# Patient Record
Sex: Male | Born: 1945 | Race: Black or African American | Hispanic: No | Smoking: Never smoker
Health system: Southern US, Community
[De-identification: ages and names within clinical notes are randomized; demographics above are authoritative.]

## PROBLEM LIST (undated history)

## (undated) DIAGNOSIS — C61 Malignant neoplasm of prostate: Secondary | ICD-10-CM

## (undated) DIAGNOSIS — R062 Wheezing: Secondary | ICD-10-CM

## (undated) DIAGNOSIS — C3490 Malignant neoplasm of unspecified part of unspecified bronchus or lung: Secondary | ICD-10-CM

## (undated) DIAGNOSIS — R569 Unspecified convulsions: Secondary | ICD-10-CM

## (undated) DIAGNOSIS — E785 Hyperlipidemia, unspecified: Secondary | ICD-10-CM

## (undated) DIAGNOSIS — I96 Gangrene, not elsewhere classified: Secondary | ICD-10-CM

## (undated) DIAGNOSIS — E119 Type 2 diabetes mellitus without complications: Secondary | ICD-10-CM

## (undated) DIAGNOSIS — I739 Peripheral vascular disease, unspecified: Secondary | ICD-10-CM

## (undated) DIAGNOSIS — I2510 Atherosclerotic heart disease of native coronary artery without angina pectoris: Secondary | ICD-10-CM

## (undated) DIAGNOSIS — I10 Essential (primary) hypertension: Secondary | ICD-10-CM

## (undated) DIAGNOSIS — I639 Cerebral infarction, unspecified: Secondary | ICD-10-CM

## (undated) HISTORY — DX: Unspecified convulsions: R56.9

## (undated) HISTORY — PX: CORONARY ARTERY BYPASS GRAFT: SHX141

## (undated) HISTORY — DX: Gangrene, not elsewhere classified: I96

## (undated) HISTORY — DX: Essential (primary) hypertension: I10

## (undated) HISTORY — DX: Wheezing: R06.2

## (undated) HISTORY — PX: OTHER SURGICAL HISTORY: SHX169

## (undated) HISTORY — DX: Hyperlipidemia, unspecified: E78.5

## (undated) HISTORY — DX: Atherosclerotic heart disease of native coronary artery without angina pectoris: I25.10

---

## 2006-01-26 ENCOUNTER — Other Ambulatory Visit: Payer: Self-pay

## 2006-01-26 ENCOUNTER — Inpatient Hospital Stay: Payer: Self-pay | Admitting: Internal Medicine

## 2010-10-09 ENCOUNTER — Ambulatory Visit: Payer: Self-pay | Admitting: Vascular Surgery

## 2010-10-13 ENCOUNTER — Inpatient Hospital Stay: Payer: Self-pay | Admitting: Internal Medicine

## 2010-10-20 LAB — PATHOLOGY REPORT

## 2011-02-06 ENCOUNTER — Inpatient Hospital Stay: Payer: Self-pay | Admitting: Internal Medicine

## 2011-11-16 ENCOUNTER — Ambulatory Visit: Payer: Self-pay | Admitting: Vascular Surgery

## 2011-11-16 LAB — BASIC METABOLIC PANEL
Anion Gap: 8 (ref 7–16)
Calcium, Total: 8.9 mg/dL (ref 8.5–10.1)
Chloride: 105 mmol/L (ref 98–107)
Co2: 28 mmol/L (ref 21–32)
Osmolality: 285 (ref 275–301)

## 2011-12-15 ENCOUNTER — Ambulatory Visit: Payer: Self-pay | Admitting: Vascular Surgery

## 2011-12-15 LAB — CBC
HCT: 36.5 % — ABNORMAL LOW (ref 40.0–52.0)
HGB: 11.6 g/dL — ABNORMAL LOW (ref 13.0–18.0)
RDW: 14.6 % — ABNORMAL HIGH (ref 11.5–14.5)
WBC: 4.4 10*3/uL (ref 3.8–10.6)

## 2011-12-15 LAB — BASIC METABOLIC PANEL
Anion Gap: 10 (ref 7–16)
BUN: 19 mg/dL — ABNORMAL HIGH (ref 7–18)
Calcium, Total: 9.1 mg/dL (ref 8.5–10.1)
Chloride: 105 mmol/L (ref 98–107)
Co2: 25 mmol/L (ref 21–32)
Creatinine: 1.02 mg/dL (ref 0.60–1.30)
EGFR (African American): >60
Potassium: 4 mmol/L (ref 3.5–5.1)

## 2011-12-24 ENCOUNTER — Inpatient Hospital Stay: Payer: Self-pay | Admitting: Vascular Surgery

## 2011-12-25 LAB — CREATININE, SERUM
Creatinine: 1.12 mg/dL (ref 0.60–1.30)
EGFR (Non-African Amer.): >60

## 2011-12-27 LAB — CBC WITH DIFFERENTIAL/PLATELET
Basophil #: 0 10*3/uL (ref 0.0–0.1)
Basophil %: 0.1 %
Eosinophil #: 0.1 10*3/uL (ref 0.0–0.7)
HCT: 28.6 % — ABNORMAL LOW (ref 40.0–52.0)
Lymphocyte %: 6.9 %
MCHC: 32 g/dL (ref 32.0–36.0)
Monocyte #: 1.1 10*3/uL — ABNORMAL HIGH (ref 0.0–0.7)
Neutrophil #: 12.9 10*3/uL — ABNORMAL HIGH (ref 1.4–6.5)
Platelet: 118 10*3/uL — ABNORMAL LOW (ref 150–440)
RDW: 14.6 % — ABNORMAL HIGH (ref 11.5–14.5)
WBC: 15.3 10*3/uL — ABNORMAL HIGH (ref 3.8–10.6)

## 2011-12-28 LAB — CBC WITH DIFFERENTIAL/PLATELET
Basophil #: 0 10*3/uL (ref 0.0–0.1)
Basophil %: 0 %
Eosinophil %: 1.7 %
HCT: 25.2 % — ABNORMAL LOW (ref 40.0–52.0)
HGB: 8.1 g/dL — ABNORMAL LOW (ref 13.0–18.0)
Lymphocyte %: 9.9 %
MCHC: 32.4 g/dL (ref 32.0–36.0)
MCV: 87 fL (ref 80–100)
Monocyte #: 1.1 10*3/uL — ABNORMAL HIGH (ref 0.0–0.7)
Monocyte %: 7.6 %
Neutrophil #: 11.7 10*3/uL — ABNORMAL HIGH (ref 1.4–6.5)
Neutrophil %: 80.8 %
RBC: 2.89 10*6/uL — ABNORMAL LOW (ref 4.40–5.90)
WBC: 14.4 10*3/uL — ABNORMAL HIGH (ref 3.8–10.6)

## 2011-12-29 LAB — CBC WITH DIFFERENTIAL/PLATELET
Basophil #: 0 10*3/uL (ref 0.0–0.1)
Eosinophil #: 0.4 10*3/uL (ref 0.0–0.7)
Eosinophil %: 3.1 %
Lymphocyte %: 14.3 %
MCV: 89 fL (ref 80–100)
Monocyte %: 8.7 %
Neutrophil #: 9.4 10*3/uL — ABNORMAL HIGH (ref 1.4–6.5)
Neutrophil %: 73.8 %
Platelet: 168 10*3/uL (ref 150–440)
RBC: 3.4 10*6/uL — ABNORMAL LOW (ref 4.40–5.90)
RDW: 14.2 % (ref 11.5–14.5)
WBC: 12.8 10*3/uL — ABNORMAL HIGH (ref 3.8–10.6)

## 2011-12-30 LAB — CBC WITH DIFFERENTIAL/PLATELET
Basophil #: 0 10*3/uL (ref 0.0–0.1)
Eosinophil #: 0.2 10*3/uL (ref 0.0–0.7)
HCT: 30 % — ABNORMAL LOW (ref 40.0–52.0)
Lymphocyte #: 2.1 10*3/uL (ref 1.0–3.6)
Lymphocyte %: 15.3 %
MCHC: 32.9 g/dL (ref 32.0–36.0)
Monocyte #: 1.1 10*3/uL — ABNORMAL HIGH (ref 0.0–0.7)
Monocyte %: 7.9 %
Neutrophil #: 10.2 10*3/uL — ABNORMAL HIGH (ref 1.4–6.5)
Neutrophil %: 74.8 %
Platelet: 200 10*3/uL (ref 150–440)
RBC: 3.43 10*6/uL — ABNORMAL LOW (ref 4.40–5.90)
RDW: 14.3 % (ref 11.5–14.5)

## 2012-01-10 ENCOUNTER — Other Ambulatory Visit: Payer: Self-pay | Admitting: Vascular Surgery

## 2012-01-10 LAB — PROTIME-INR
INR: 2.1
Prothrombin Time: 23.7 secs — ABNORMAL HIGH (ref 11.5–14.7)

## 2012-01-20 ENCOUNTER — Ambulatory Visit: Payer: Self-pay | Admitting: Vascular Surgery

## 2012-01-20 LAB — BASIC METABOLIC PANEL
Anion Gap: 11 (ref 7–16)
BUN: 21 mg/dL — ABNORMAL HIGH (ref 7–18)
Calcium, Total: 9.1 mg/dL (ref 8.5–10.1)
Chloride: 104 mmol/L (ref 98–107)
Co2: 25 mmol/L (ref 21–32)
Creatinine: 1.16 mg/dL (ref 0.60–1.30)
EGFR (African American): >60
EGFR (Non-African Amer.): >60
Glucose: 168 mg/dL — ABNORMAL HIGH (ref 65–99)
Potassium: 4.3 mmol/L (ref 3.5–5.1)
Sodium: 140 mmol/L (ref 136–145)

## 2012-01-20 LAB — PROTIME-INR: Prothrombin Time: 28.4 secs — ABNORMAL HIGH (ref 11.5–14.7)

## 2012-01-24 ENCOUNTER — Other Ambulatory Visit: Payer: Self-pay | Admitting: Vascular Surgery

## 2012-01-24 LAB — PROTIME-INR
INR: 2.4
Prothrombin Time: 26.6 secs — ABNORMAL HIGH (ref 11.5–14.7)

## 2012-01-26 ENCOUNTER — Ambulatory Visit: Payer: Self-pay | Admitting: Vascular Surgery

## 2012-01-26 LAB — CREATININE, SERUM
EGFR (African American): >60
EGFR (Non-African Amer.): >60

## 2012-01-27 ENCOUNTER — Ambulatory Visit: Payer: Self-pay | Admitting: Vascular Surgery

## 2012-01-27 LAB — BASIC METABOLIC PANEL
Anion Gap: 7 (ref 7–16)
BUN: 21 mg/dL — ABNORMAL HIGH (ref 7–18)
Chloride: 102 mmol/L (ref 98–107)
Co2: 26 mmol/L (ref 21–32)
Creatinine: 1.13 mg/dL (ref 0.60–1.30)
EGFR (Non-African Amer.): >60
Glucose: 193 mg/dL — ABNORMAL HIGH (ref 65–99)
Osmolality: 278 (ref 275–301)
Sodium: 135 mmol/L — ABNORMAL LOW (ref 136–145)

## 2012-01-27 LAB — CBC
HCT: 31.8 % — ABNORMAL LOW (ref 40.0–52.0)
MCH: 27.6 pg (ref 26.0–34.0)
MCHC: 31.7 g/dL — ABNORMAL LOW (ref 32.0–36.0)
MCV: 87 fL (ref 80–100)
Platelet: 281 10*3/uL (ref 150–440)
WBC: 9.4 10*3/uL (ref 3.8–10.6)

## 2012-02-01 ENCOUNTER — Inpatient Hospital Stay: Payer: Self-pay | Admitting: Vascular Surgery

## 2012-02-02 LAB — BASIC METABOLIC PANEL
Anion Gap: 9 (ref 7–16)
BUN: 13 mg/dL (ref 7–18)
Calcium, Total: 8.3 mg/dL — ABNORMAL LOW (ref 8.5–10.1)
Co2: 23 mmol/L (ref 21–32)
Creatinine: 0.86 mg/dL (ref 0.60–1.30)
EGFR (African American): >60
EGFR (Non-African Amer.): >60
Osmolality: 276 (ref 275–301)
Sodium: 137 mmol/L (ref 136–145)

## 2012-02-02 LAB — CBC WITH DIFFERENTIAL/PLATELET
Basophil %: 0.3 %
Eosinophil #: 0.3 10*3/uL (ref 0.0–0.7)
Eosinophil %: 2.8 %
HCT: 24.3 % — ABNORMAL LOW (ref 40.0–52.0)
HGB: 7.9 g/dL — ABNORMAL LOW (ref 13.0–18.0)
Lymphocyte #: 1.7 10*3/uL (ref 1.0–3.6)
Lymphocyte %: 14.4 %
MCHC: 32.4 g/dL (ref 32.0–36.0)
Monocyte #: 0.7 x10 3/mm (ref 0.2–1.0)
Neutrophil #: 8.9 10*3/uL — ABNORMAL HIGH (ref 1.4–6.5)
Platelet: 256 10*3/uL (ref 150–440)
RBC: 2.85 10*6/uL — ABNORMAL LOW (ref 4.40–5.90)

## 2012-02-02 LAB — PROTIME-INR: INR: 1.2

## 2012-02-03 LAB — APTT
Activated PTT: 57.5 secs — ABNORMAL HIGH (ref 23.6–35.9)
Activated PTT: 71.4 secs — ABNORMAL HIGH (ref 23.6–35.9)

## 2012-02-04 LAB — BASIC METABOLIC PANEL
Anion Gap: 9 (ref 7–16)
BUN: 11 mg/dL (ref 7–18)
Chloride: 103 mmol/L (ref 98–107)
Osmolality: 274 (ref 275–301)
Potassium: 3.7 mmol/L (ref 3.5–5.1)
Sodium: 136 mmol/L (ref 136–145)

## 2012-02-04 LAB — CBC WITH DIFFERENTIAL/PLATELET
Basophil #: 0 10*3/uL (ref 0.0–0.1)
Basophil %: 0.3 %
HCT: 31.4 % — ABNORMAL LOW (ref 40.0–52.0)
MCH: 27.2 pg (ref 26.0–34.0)
MCHC: 31.8 g/dL — ABNORMAL LOW (ref 32.0–36.0)
Monocyte #: 1 x10 3/mm (ref 0.2–1.0)
Monocyte %: 8.1 %
Neutrophil #: 9.2 10*3/uL — ABNORMAL HIGH (ref 1.4–6.5)
Neutrophil %: 72.9 %
RBC: 3.67 10*6/uL — ABNORMAL LOW (ref 4.40–5.90)
RDW: 15.1 % — ABNORMAL HIGH (ref 11.5–14.5)
WBC: 12.7 10*3/uL — ABNORMAL HIGH (ref 3.8–10.6)

## 2012-02-04 LAB — PROTIME-INR
INR: 1.1
Prothrombin Time: 14.4 secs (ref 11.5–14.7)

## 2012-02-04 LAB — PLATELET COUNT: Platelet: 233 10*3/uL (ref 150–440)

## 2012-02-04 LAB — APTT: Activated PTT: 88.7 secs — ABNORMAL HIGH (ref 23.6–35.9)

## 2012-02-05 LAB — APTT
Activated PTT: 46.2 secs — ABNORMAL HIGH (ref 23.6–35.9)
Activated PTT: 40.4 secs — ABNORMAL HIGH (ref 23.6–35.9)
Activated PTT: 41.5 secs — ABNORMAL HIGH (ref 23.6–35.9)

## 2012-02-06 LAB — APTT
Activated PTT: 132.3 secs — ABNORMAL HIGH (ref 23.6–35.9)
Activated PTT: >160 secs (ref 23.6–35.9)
Activated PTT: 106.4 secs — ABNORMAL HIGH (ref 23.6–35.9)

## 2012-02-06 LAB — PROTIME-INR: INR: 1.2

## 2012-02-07 LAB — PROTIME-INR: INR: 1.2

## 2012-02-07 LAB — HEMOGLOBIN: HGB: 9.7 g/dL — ABNORMAL LOW (ref 13.0–18.0)

## 2012-02-07 LAB — PLATELET COUNT: Platelet: 241 10*3/uL (ref 150–440)

## 2012-02-08 LAB — PROTIME-INR
INR: 1.3
Prothrombin Time: 17 secs — ABNORMAL HIGH (ref 11.5–14.7)

## 2012-02-08 LAB — APTT
Activated PTT: 123.8 secs — ABNORMAL HIGH (ref 23.6–35.9)
Activated PTT: 41.6 secs — ABNORMAL HIGH (ref 23.6–35.9)

## 2012-02-09 LAB — CREATININE, SERUM
Creatinine: 1.16 mg/dL (ref 0.60–1.30)
EGFR (African American): >60
EGFR (Non-African Amer.): >60

## 2012-02-22 LAB — PROTIME-INR
INR: 1.7
Prothrombin Time: 20.4 secs — ABNORMAL HIGH (ref 11.5–14.7)

## 2012-02-23 ENCOUNTER — Other Ambulatory Visit: Payer: Self-pay | Admitting: Vascular Surgery

## 2012-03-09 LAB — PROTIME-INR: Prothrombin Time: 21.5 secs — ABNORMAL HIGH (ref 11.5–14.7)

## 2012-03-25 ENCOUNTER — Other Ambulatory Visit: Payer: Self-pay | Admitting: Vascular Surgery

## 2012-03-28 LAB — PROTIME-INR
INR: 1.8
Prothrombin Time: 21.6 secs — ABNORMAL HIGH (ref 11.5–14.7)

## 2012-04-04 LAB — PROTIME-INR
INR: 2.1
Prothrombin Time: 23.5 secs — ABNORMAL HIGH (ref 11.5–14.7)

## 2012-04-11 LAB — PROTIME-INR: INR: 2.3

## 2012-04-18 LAB — PROTIME-INR: Prothrombin Time: 30.8 secs — ABNORMAL HIGH (ref 11.5–14.7)

## 2012-04-24 ENCOUNTER — Other Ambulatory Visit: Payer: Self-pay | Admitting: Vascular Surgery

## 2012-04-25 LAB — PROTIME-INR: INR: 3

## 2012-05-09 LAB — PROTIME-INR: Prothrombin Time: 29.5 secs — ABNORMAL HIGH (ref 11.5–14.7)

## 2012-05-16 LAB — PROTIME-INR
INR: 2.9
Prothrombin Time: 30.3 secs — ABNORMAL HIGH (ref 11.5–14.7)

## 2012-05-23 LAB — PROTIME-INR
INR: 2.9
Prothrombin Time: 30.5 secs — ABNORMAL HIGH (ref 11.5–14.7)

## 2012-05-25 ENCOUNTER — Other Ambulatory Visit: Payer: Self-pay | Admitting: Vascular Surgery

## 2012-05-30 LAB — PROTIME-INR: INR: 1

## 2012-06-06 LAB — PROTIME-INR
INR: 1.3
Prothrombin Time: 16.3 secs — ABNORMAL HIGH (ref 11.5–14.7)

## 2012-06-22 LAB — PROTIME-INR
INR: 5.4
Prothrombin Time: 48.8 secs — ABNORMAL HIGH (ref 11.5–14.7)

## 2012-06-25 ENCOUNTER — Other Ambulatory Visit: Payer: Self-pay | Admitting: Vascular Surgery

## 2012-06-27 LAB — PROTIME-INR: INR: 2.9

## 2012-07-04 LAB — PROTIME-INR: INR: 1.8

## 2012-07-11 LAB — PROTIME-INR: INR: 2.4

## 2012-07-18 LAB — PROTIME-INR: Prothrombin Time: 22.5 secs — ABNORMAL HIGH (ref 11.5–14.7)

## 2012-07-25 ENCOUNTER — Other Ambulatory Visit: Payer: Self-pay | Admitting: Vascular Surgery

## 2012-07-26 LAB — PROTIME-INR
INR: 2.4
Prothrombin Time: 26.1 secs — ABNORMAL HIGH (ref 11.5–14.7)

## 2012-08-01 LAB — PROTIME-INR: Prothrombin Time: 20.8 secs — ABNORMAL HIGH (ref 11.5–14.7)

## 2012-08-09 LAB — CREATININE, SERUM
EGFR (African American): >60
EGFR (Non-African Amer.): >60

## 2012-08-15 LAB — CREATININE, SERUM
Creatinine: 1.11 mg/dL
EGFR (African American): >60
EGFR (Non-African Amer.): >60

## 2012-08-23 LAB — CREATININE, SERUM: EGFR (African American): >60

## 2012-08-25 ENCOUNTER — Other Ambulatory Visit: Payer: Self-pay | Admitting: Vascular Surgery

## 2012-09-05 LAB — CREATININE, SERUM
Creatinine: 1.1 mg/dL (ref 0.60–1.30)
EGFR (African American): >60
EGFR (Non-African Amer.): >60

## 2012-09-13 LAB — CREATININE, SERUM
EGFR (African American): >60
EGFR (Non-African Amer.): >60

## 2012-09-22 LAB — CREATININE, SERUM
EGFR (African American): >60
EGFR (Non-African Amer.): >60

## 2012-09-24 ENCOUNTER — Other Ambulatory Visit: Payer: Self-pay | Admitting: Vascular Surgery

## 2012-09-26 LAB — CREATININE, SERUM
Creatinine: 1.3 mg/dL (ref 0.60–1.30)
EGFR (African American): >60
EGFR (Non-African Amer.): 56 — ABNORMAL LOW

## 2012-10-03 LAB — CREATININE, SERUM
Creatinine: 1.13 mg/dL (ref 0.60–1.30)
EGFR (African American): >60

## 2012-10-10 LAB — CREATININE, SERUM
Creatinine: 1.13 mg/dL (ref 0.60–1.30)
EGFR (African American): >60
EGFR (Non-African Amer.): >60

## 2012-10-20 LAB — CREATININE, SERUM
Creatinine: 1.17 mg/dL (ref 0.60–1.30)
EGFR (African American): >60
EGFR (Non-African Amer.): >60

## 2012-10-24 LAB — CREATININE, SERUM
EGFR (African American): 59 — ABNORMAL LOW
EGFR (Non-African Amer.): 51 — ABNORMAL LOW

## 2012-10-25 ENCOUNTER — Other Ambulatory Visit: Payer: Self-pay | Admitting: Vascular Surgery

## 2012-11-01 LAB — CREATININE, SERUM
Creatinine: 1.19 mg/dL (ref 0.60–1.30)
EGFR (Non-African Amer.): >60

## 2012-11-07 LAB — CREATININE, SERUM
Creatinine: 1.14 mg/dL (ref 0.60–1.30)
EGFR (African American): >60
EGFR (Non-African Amer.): >60

## 2012-11-14 LAB — CREATININE, SERUM
Creatinine: 0.94 mg/dL (ref 0.60–1.30)
EGFR (African American): >60

## 2012-11-23 LAB — CREATININE, SERUM
Creatinine: 1.02 mg/dL (ref 0.60–1.30)
EGFR (African American): >60

## 2012-11-25 ENCOUNTER — Other Ambulatory Visit: Payer: Self-pay | Admitting: Vascular Surgery

## 2012-11-28 LAB — CREATININE, SERUM
Creatinine: 1.11 mg/dL (ref 0.60–1.30)
EGFR (Non-African Amer.): >60

## 2012-12-05 LAB — CREATININE, SERUM
Creatinine: 1.66 mg/dL — ABNORMAL HIGH (ref 0.60–1.30)
EGFR (African American): 49 — ABNORMAL LOW
EGFR (Non-African Amer.): 42 — ABNORMAL LOW

## 2012-12-13 LAB — CREATININE, SERUM: EGFR (Non-African Amer.): >60

## 2012-12-20 LAB — CREATININE, SERUM: EGFR (African American): >60

## 2012-12-23 ENCOUNTER — Other Ambulatory Visit: Payer: Self-pay | Admitting: Vascular Surgery

## 2012-12-28 LAB — CREATININE, SERUM
Creatinine: 1.32 mg/dL — ABNORMAL HIGH (ref 0.60–1.30)
EGFR (African American): >60
EGFR (Non-African Amer.): 55 — ABNORMAL LOW

## 2013-01-02 LAB — CREATININE, SERUM
Creatinine: 1.09 mg/dL (ref 0.60–1.30)
EGFR (Non-African Amer.): >60

## 2013-01-10 LAB — CREATININE, SERUM: Creatinine: 1.09 mg/dL (ref 0.60–1.30)

## 2013-01-16 LAB — CREATININE, SERUM: Creatinine: 1.49 mg/dL — ABNORMAL HIGH (ref 0.60–1.30)

## 2013-01-23 ENCOUNTER — Other Ambulatory Visit: Payer: Self-pay | Admitting: Vascular Surgery

## 2013-02-12 LAB — PROTIME-INR: INR: 1

## 2013-02-20 LAB — PROTIME-INR
INR: 1.1
Prothrombin Time: 14 secs (ref 11.5–14.7)

## 2013-02-22 ENCOUNTER — Other Ambulatory Visit: Payer: Self-pay | Admitting: Vascular Surgery

## 2013-03-09 LAB — PROTIME-INR: INR: 1

## 2013-03-25 ENCOUNTER — Other Ambulatory Visit: Payer: Self-pay | Admitting: Vascular Surgery

## 2014-01-23 ENCOUNTER — Observation Stay: Payer: Self-pay | Admitting: Vascular Surgery

## 2014-01-23 LAB — FIBRINOGEN: FIBRINOGEN: 242 mg/dL (ref 210–470)

## 2014-01-23 LAB — BASIC METABOLIC PANEL
Anion Gap: 4 — ABNORMAL LOW (ref 7–16)
BUN: 15 mg/dL (ref 7–18)
Calcium, Total: 9 mg/dL (ref 8.5–10.1)
Chloride: 104 mmol/L (ref 98–107)
Co2: 27 mmol/L (ref 21–32)
Creatinine: 1.14 mg/dL (ref 0.60–1.30)
EGFR (African American): >60
EGFR (Non-African Amer.): >60
GLUCOSE: 206 mg/dL — AB (ref 65–99)
Osmolality: 277 (ref 275–301)
Potassium: 4.6 mmol/L (ref 3.5–5.1)
Sodium: 135 mmol/L — ABNORMAL LOW (ref 136–145)

## 2014-01-23 LAB — CBC WITH DIFFERENTIAL/PLATELET
BASOS PCT: 0.6 %
Basophil #: 0 10*3/uL (ref 0.0–0.1)
Basophil #: 0 10*3/uL (ref 0.0–0.1)
Basophil %: 0.2 %
EOS ABS: 0 10*3/uL (ref 0.0–0.7)
Eosinophil #: 0 10*3/uL (ref 0.0–0.7)
Eosinophil %: 0.1 %
Eosinophil %: 0.6 %
HCT: 42.1 % (ref 40.0–52.0)
HCT: 38.2 % — ABNORMAL LOW (ref 40.0–52.0)
HGB: 13.2 g/dL (ref 13.0–18.0)
HGB: 12.2 g/dL — ABNORMAL LOW (ref 13.0–18.0)
LYMPHS ABS: 1.4 10*3/uL (ref 1.0–3.6)
Lymphocyte #: 0.9 10*3/uL — ABNORMAL LOW (ref 1.0–3.6)
Lymphocyte %: 11.5 %
Lymphocyte %: 20.4 %
MCH: 27.7 pg (ref 26.0–34.0)
MCH: 28.2 pg (ref 26.0–34.0)
MCHC: 31.4 g/dL — ABNORMAL LOW (ref 32.0–36.0)
MCHC: 32.1 g/dL (ref 32.0–36.0)
MCV: 88 fL (ref 80–100)
MCV: 88 fL (ref 80–100)
MONOS PCT: 3.5 %
Monocyte #: 0.3 x10 3/mm (ref 0.2–1.0)
Monocyte #: 0.7 x10 3/mm (ref 0.2–1.0)
Monocyte %: 9.7 %
NEUTROS PCT: 84.3 %
Neutrophil #: 6.7 10*3/uL — ABNORMAL HIGH (ref 1.4–6.5)
Neutrophil #: 4.8 10*3/uL (ref 1.4–6.5)
Neutrophil %: 69.1 %
Platelet: 149 10*3/uL — ABNORMAL LOW (ref 150–440)
Platelet: 144 10*3/uL — ABNORMAL LOW (ref 150–440)
RBC: 4.76 10*6/uL (ref 4.40–5.90)
RBC: 4.34 10*6/uL — ABNORMAL LOW (ref 4.40–5.90)
RDW: 14.3 % (ref 11.5–14.5)
RDW: 14.3 % (ref 11.5–14.5)
WBC: 8 10*3/uL (ref 3.8–10.6)
WBC: 7 10*3/uL (ref 3.8–10.6)

## 2014-01-23 LAB — APTT
Activated PTT: 30.4 secs (ref 23.6–35.9)
Activated PTT: 127.2 secs — ABNORMAL HIGH (ref 23.6–35.9)

## 2014-01-24 LAB — BASIC METABOLIC PANEL
ANION GAP: 6 — AB (ref 7–16)
BUN: 14 mg/dL (ref 7–18)
Calcium, Total: 8.7 mg/dL (ref 8.5–10.1)
Chloride: 104 mmol/L (ref 98–107)
Co2: 27 mmol/L (ref 21–32)
Creatinine: 1.2 mg/dL (ref 0.60–1.30)
EGFR (African American): >60
GLUCOSE: 173 mg/dL — AB (ref 65–99)
Osmolality: 278 (ref 275–301)
POTASSIUM: 4.2 mmol/L (ref 3.5–5.1)
Sodium: 137 mmol/L (ref 136–145)

## 2014-01-24 LAB — PLATELET COUNT: Platelet: 122 10*3/uL — ABNORMAL LOW (ref 150–440)

## 2014-01-24 LAB — APTT
ACTIVATED PTT: 45.3 s — AB (ref 23.6–35.9)
Activated PTT: 56.8 secs — ABNORMAL HIGH (ref 23.6–35.9)

## 2014-01-24 LAB — CBC WITH DIFFERENTIAL/PLATELET
Basophil #: 0 10*3/uL (ref 0.0–0.1)
Basophil %: 0.3 %
Eosinophil #: 0.1 10*3/uL (ref 0.0–0.7)
Eosinophil %: 1 %
HCT: 39.8 % — ABNORMAL LOW (ref 40.0–52.0)
HGB: 12.9 g/dL — AB (ref 13.0–18.0)
LYMPHS ABS: 1.5 10*3/uL (ref 1.0–3.6)
LYMPHS PCT: 19.9 %
MCH: 28.4 pg (ref 26.0–34.0)
MCHC: 32.3 g/dL (ref 32.0–36.0)
MCV: 88 fL (ref 80–100)
MONO ABS: 0.9 x10 3/mm (ref 0.2–1.0)
Monocyte %: 11.9 %
Neutrophil #: 5.2 10*3/uL (ref 1.4–6.5)
Neutrophil %: 66.9 %
PLATELETS: 114 10*3/uL — AB (ref 150–440)
RBC: 4.54 10*6/uL (ref 4.40–5.90)
RDW: 14.3 % (ref 11.5–14.5)
WBC: 7.8 10*3/uL (ref 3.8–10.6)

## 2014-01-24 LAB — FIBRINOGEN: Fibrinogen: 186 mg/dL — ABNORMAL LOW (ref 210–470)

## 2014-01-24 LAB — PROTIME-INR
INR: 1.2
Prothrombin Time: 15.4 secs — ABNORMAL HIGH (ref 11.5–14.7)

## 2014-01-25 LAB — BASIC METABOLIC PANEL
Anion Gap: 7 (ref 7–16)
BUN: 10 mg/dL (ref 7–18)
CREATININE: 1.09 mg/dL (ref 0.60–1.30)
Calcium, Total: 9 mg/dL (ref 8.5–10.1)
Chloride: 104 mmol/L (ref 98–107)
Co2: 25 mmol/L (ref 21–32)
EGFR (Non-African Amer.): >60
Glucose: 171 mg/dL — ABNORMAL HIGH (ref 65–99)
Osmolality: 275 (ref 275–301)
Potassium: 4.4 mmol/L (ref 3.5–5.1)
Sodium: 136 mmol/L (ref 136–145)

## 2014-06-11 ENCOUNTER — Inpatient Hospital Stay: Payer: Self-pay | Admitting: Vascular Surgery

## 2014-06-11 LAB — CBC WITH DIFFERENTIAL/PLATELET
Basophil #: 0 10*3/uL (ref 0.0–0.1)
Basophil %: 0.3 %
EOS ABS: 0.1 10*3/uL (ref 0.0–0.7)
Eosinophil %: 1.1 %
HCT: 36.8 % — AB (ref 40.0–52.0)
HGB: 11.8 g/dL — ABNORMAL LOW (ref 13.0–18.0)
LYMPHS ABS: 1.1 10*3/uL (ref 1.0–3.6)
Lymphocyte %: 12.2 %
MCH: 27.4 pg (ref 26.0–34.0)
MCHC: 32 g/dL (ref 32.0–36.0)
MCV: 86 fL (ref 80–100)
MONO ABS: 0.7 x10 3/mm (ref 0.2–1.0)
Monocyte %: 7.6 %
NEUTROS PCT: 78.8 %
Neutrophil #: 7.4 10*3/uL — ABNORMAL HIGH (ref 1.4–6.5)
Platelet: 190 10*3/uL (ref 150–440)
RBC: 4.29 10*6/uL — AB (ref 4.40–5.90)
RDW: 14.7 % — AB (ref 11.5–14.5)
WBC: 9.4 10*3/uL (ref 3.8–10.6)

## 2014-06-11 LAB — APTT: ACTIVATED PTT: 41.7 s — AB (ref 23.6–35.9)

## 2014-06-11 LAB — PROTIME-INR
INR: 1.3
Prothrombin Time: 15.9 secs — ABNORMAL HIGH (ref 11.5–14.7)

## 2014-06-12 LAB — CBC WITH DIFFERENTIAL/PLATELET
BASOS PCT: 0.4 %
Basophil #: 0 10*3/uL (ref 0.0–0.1)
EOS PCT: 2.7 %
Eosinophil #: 0.2 10*3/uL (ref 0.0–0.7)
HCT: 34.7 % — ABNORMAL LOW (ref 40.0–52.0)
HGB: 11.4 g/dL — ABNORMAL LOW (ref 13.0–18.0)
Lymphocyte #: 2.1 10*3/uL (ref 1.0–3.6)
Lymphocyte %: 26.7 %
MCH: 27.7 pg (ref 26.0–34.0)
MCHC: 32.7 g/dL (ref 32.0–36.0)
MCV: 85 fL (ref 80–100)
MONO ABS: 0.7 x10 3/mm (ref 0.2–1.0)
Monocyte %: 9.1 %
NEUTROS PCT: 61.1 %
Neutrophil #: 4.8 10*3/uL (ref 1.4–6.5)
Platelet: 182 10*3/uL (ref 150–440)
RBC: 4.1 10*6/uL — AB (ref 4.40–5.90)
RDW: 14 % (ref 11.5–14.5)
WBC: 7.8 10*3/uL (ref 3.8–10.6)

## 2014-06-12 LAB — HEPARIN LEVEL (UNFRACTIONATED)
Anti-Xa(Unfractionated): >1.1 IU/mL (ref 0.30–0.70)
Anti-Xa(Unfractionated): 0.66 IU/mL (ref 0.30–0.70)

## 2014-06-12 LAB — BASIC METABOLIC PANEL
ANION GAP: 10 (ref 7–16)
BUN: 12 mg/dL (ref 7–18)
CO2: 24 mmol/L (ref 21–32)
Calcium, Total: 9 mg/dL (ref 8.5–10.1)
Chloride: 103 mmol/L (ref 98–107)
Creatinine: 1.12 mg/dL (ref 0.60–1.30)
EGFR (African American): >60
Glucose: 151 mg/dL — ABNORMAL HIGH (ref 65–99)
Osmolality: 276 (ref 275–301)
Potassium: 3.7 mmol/L (ref 3.5–5.1)
Sodium: 137 mmol/L (ref 136–145)

## 2014-06-13 LAB — CBC WITH DIFFERENTIAL/PLATELET
BASOS ABS: 0 10*3/uL (ref 0.0–0.1)
BASOS PCT: 0.3 %
Basophil #: 0 10*3/uL (ref 0.0–0.1)
Basophil #: 0 10*3/uL (ref 0.0–0.1)
Basophil %: 0.7 %
Basophil %: 0.6 %
EOS ABS: 0.1 10*3/uL (ref 0.0–0.7)
EOS ABS: 0 10*3/uL (ref 0.0–0.7)
Eosinophil #: 0.3 10*3/uL (ref 0.0–0.7)
Eosinophil %: 4.8 %
Eosinophil %: 1.8 %
Eosinophil %: 0.1 %
HCT: 36.4 % — ABNORMAL LOW (ref 40.0–52.0)
HCT: 38.8 % — ABNORMAL LOW (ref 40.0–52.0)
HCT: 36.3 % — AB (ref 40.0–52.0)
HGB: 11.5 g/dL — AB (ref 13.0–18.0)
HGB: 12.2 g/dL — ABNORMAL LOW (ref 13.0–18.0)
HGB: 11.9 g/dL — AB (ref 13.0–18.0)
LYMPHS ABS: 1.4 10*3/uL (ref 1.0–3.6)
LYMPHS PCT: 27.1 %
LYMPHS PCT: 21.6 %
Lymphocyte #: 1.7 10*3/uL (ref 1.0–3.6)
Lymphocyte #: 0.6 10*3/uL — ABNORMAL LOW (ref 1.0–3.6)
Lymphocyte %: 8.2 %
MCH: 27.1 pg (ref 26.0–34.0)
MCH: 26.8 pg (ref 26.0–34.0)
MCH: 27.7 pg (ref 26.0–34.0)
MCHC: 31.6 g/dL — ABNORMAL LOW (ref 32.0–36.0)
MCHC: 31.5 g/dL — AB (ref 32.0–36.0)
MCHC: 32.8 g/dL (ref 32.0–36.0)
MCV: 86 fL (ref 80–100)
MCV: 85 fL (ref 80–100)
MCV: 84 fL (ref 80–100)
MONO ABS: 0.5 x10 3/mm (ref 0.2–1.0)
Monocyte #: 0.6 x10 3/mm (ref 0.2–1.0)
Monocyte #: 0.4 x10 3/mm (ref 0.2–1.0)
Monocyte %: 10 %
Monocyte %: 6.8 %
Monocyte %: 6.9 %
NEUTROS ABS: 3.7 10*3/uL (ref 1.4–6.5)
Neutrophil #: 4.5 10*3/uL (ref 1.4–6.5)
Neutrophil #: 6.7 10*3/uL — ABNORMAL HIGH (ref 1.4–6.5)
Neutrophil %: 57.4 %
Neutrophil %: 69.2 %
Neutrophil %: 84.5 %
PLATELETS: 200 10*3/uL (ref 150–440)
PLATELETS: 209 10*3/uL (ref 150–440)
Platelet: 214 10*3/uL (ref 150–440)
RBC: 4.24 10*6/uL — ABNORMAL LOW (ref 4.40–5.90)
RBC: 4.56 10*6/uL (ref 4.40–5.90)
RBC: 4.3 10*6/uL — AB (ref 4.40–5.90)
RDW: 13.8 % (ref 11.5–14.5)
RDW: 14.2 % (ref 11.5–14.5)
RDW: 14.2 % (ref 11.5–14.5)
WBC: 6.4 10*3/uL (ref 3.8–10.6)
WBC: 6.5 10*3/uL (ref 3.8–10.6)
WBC: 7.9 10*3/uL (ref 3.8–10.6)

## 2014-06-13 LAB — HEPARIN LEVEL (UNFRACTIONATED): Anti-Xa(Unfractionated): 0.54 IU/mL (ref 0.30–0.70)

## 2014-06-13 LAB — FIBRINOGEN
FIBRINOGEN: 620 mg/dL — AB (ref 210–470)
Fibrinogen: 570 mg/dL — ABNORMAL HIGH (ref 210–470)

## 2014-06-14 LAB — CBC WITH DIFFERENTIAL/PLATELET
BASOS ABS: 0 10*3/uL (ref 0.0–0.1)
BASOS ABS: 0.1 10*3/uL (ref 0.0–0.1)
BASOS PCT: 2.1 %
Basophil #: 0.2 10*3/uL — ABNORMAL HIGH (ref 0.0–0.1)
Basophil %: 0.4 %
Basophil %: 0.3 %
EOS PCT: 0.1 %
EOS PCT: 0 %
Eosinophil #: 0 10*3/uL (ref 0.0–0.7)
Eosinophil #: 0 10*3/uL (ref 0.0–0.7)
Eosinophil #: 0 10*3/uL (ref 0.0–0.7)
Eosinophil %: 0.3 %
HCT: 34.5 % — ABNORMAL LOW (ref 40.0–52.0)
HCT: 34.1 % — AB (ref 40.0–52.0)
HCT: 34.7 % — ABNORMAL LOW (ref 40.0–52.0)
HGB: 10.9 g/dL — ABNORMAL LOW (ref 13.0–18.0)
HGB: 10.8 g/dL — AB (ref 13.0–18.0)
HGB: 10.8 g/dL — AB (ref 13.0–18.0)
LYMPHS ABS: 1.3 10*3/uL (ref 1.0–3.6)
Lymphocyte #: 0.7 10*3/uL — ABNORMAL LOW (ref 1.0–3.6)
Lymphocyte #: 0.8 10*3/uL — ABNORMAL LOW (ref 1.0–3.6)
Lymphocyte %: 6.7 %
Lymphocyte %: 11.3 %
Lymphocyte %: 5 %
MCH: 27 pg (ref 26.0–34.0)
MCH: 26.9 pg (ref 26.0–34.0)
MCH: 26.8 pg (ref 26.0–34.0)
MCHC: 31.7 g/dL — ABNORMAL LOW (ref 32.0–36.0)
MCHC: 31.5 g/dL — AB (ref 32.0–36.0)
MCHC: 31 g/dL — AB (ref 32.0–36.0)
MCV: 85 fL (ref 80–100)
MCV: 85 fL (ref 80–100)
MCV: 86 fL (ref 80–100)
MONOS PCT: 8.7 %
MONOS PCT: 6.7 %
Monocyte #: 0.7 x10 3/mm (ref 0.2–1.0)
Monocyte #: 1 x10 3/mm (ref 0.2–1.0)
Monocyte #: 1.2 x10 3/mm — ABNORMAL HIGH (ref 0.2–1.0)
Monocyte %: 7.3 %
NEUTROS ABS: 9.4 10*3/uL — AB (ref 1.4–6.5)
NEUTROS ABS: 14.8 10*3/uL — AB (ref 1.4–6.5)
Neutrophil #: 9.3 10*3/uL — ABNORMAL HIGH (ref 1.4–6.5)
Neutrophil %: 84.4 %
Neutrophil %: 79.3 %
Neutrophil %: 87.4 %
PLATELETS: 205 10*3/uL (ref 150–440)
PLATELETS: 201 10*3/uL (ref 150–440)
PLATELETS: 188 10*3/uL (ref 150–440)
RBC: 4.05 10*6/uL — ABNORMAL LOW (ref 4.40–5.90)
RBC: 4 10*6/uL — ABNORMAL LOW (ref 4.40–5.90)
RBC: 4.03 10*6/uL — AB (ref 4.40–5.90)
RDW: 14.2 % (ref 11.5–14.5)
RDW: 14.1 % (ref 11.5–14.5)
RDW: 14.1 % (ref 11.5–14.5)
WBC: 11.2 10*3/uL — ABNORMAL HIGH (ref 3.8–10.6)
WBC: 11.7 10*3/uL — AB (ref 3.8–10.6)
WBC: 16.9 10*3/uL — ABNORMAL HIGH (ref 3.8–10.6)

## 2014-06-14 LAB — FIBRINOGEN
Fibrinogen: 385 mg/dL (ref 210–470)
Fibrinogen: 533 mg/dL — ABNORMAL HIGH (ref 210–470)

## 2014-06-15 LAB — CREATININE, SERUM
CREATININE: 1.1 mg/dL (ref 0.60–1.30)
EGFR (African American): >60

## 2014-06-18 ENCOUNTER — Ambulatory Visit: Payer: Self-pay | Admitting: Vascular Surgery

## 2014-06-18 LAB — CBC WITH DIFFERENTIAL/PLATELET
BASOS ABS: 0 10*3/uL (ref 0.0–0.1)
Basophil %: 0.3 %
Eosinophil #: 0.1 10*3/uL (ref 0.0–0.7)
Eosinophil %: 1.2 %
HCT: 29.4 % — AB (ref 40.0–52.0)
HGB: 9.3 g/dL — AB (ref 13.0–18.0)
LYMPHS PCT: 12.3 %
Lymphocyte #: 1.5 10*3/uL (ref 1.0–3.6)
MCH: 27.1 pg (ref 26.0–34.0)
MCHC: 31.5 g/dL — ABNORMAL LOW (ref 32.0–36.0)
MCV: 86 fL (ref 80–100)
MONO ABS: 1.1 x10 3/mm — AB (ref 0.2–1.0)
MONOS PCT: 9.6 %
NEUTROS ABS: 9.2 10*3/uL — AB (ref 1.4–6.5)
Neutrophil %: 76.6 %
Platelet: 245 10*3/uL (ref 150–440)
RBC: 3.41 10*6/uL — ABNORMAL LOW (ref 4.40–5.90)
RDW: 14.2 % (ref 11.5–14.5)
WBC: 12 10*3/uL — ABNORMAL HIGH (ref 3.8–10.6)

## 2014-06-18 LAB — BASIC METABOLIC PANEL
Anion Gap: 11 (ref 7–16)
BUN: 10 mg/dL (ref 7–18)
CHLORIDE: 101 mmol/L (ref 98–107)
CREATININE: 1.21 mg/dL (ref 0.60–1.30)
Calcium, Total: 9.1 mg/dL (ref 8.5–10.1)
Co2: 24 mmol/L (ref 21–32)
EGFR (African American): >60
EGFR (Non-African Amer.): >60
Glucose: 158 mg/dL — ABNORMAL HIGH (ref 65–99)
OSMOLALITY: 274 (ref 275–301)
POTASSIUM: 3.7 mmol/L (ref 3.5–5.1)
Sodium: 136 mmol/L (ref 136–145)

## 2014-07-08 ENCOUNTER — Ambulatory Visit: Payer: Self-pay | Admitting: Vascular Surgery

## 2014-07-08 DIAGNOSIS — E119 Type 2 diabetes mellitus without complications: Secondary | ICD-10-CM

## 2014-07-08 DIAGNOSIS — Z01810 Encounter for preprocedural cardiovascular examination: Secondary | ICD-10-CM

## 2014-07-08 LAB — URINALYSIS, COMPLETE
BLOOD: NEGATIVE
Bilirubin,UR: NEGATIVE
Glucose,UR: NEGATIVE mg/dL (ref 0–75)
Hyaline Cast: 17
KETONE: NEGATIVE
Leukocyte Esterase: NEGATIVE
NITRITE: NEGATIVE
Ph: 5 (ref 4.5–8.0)
RBC,UR: 1 /HPF (ref 0–5)
Specific Gravity: 1.029 (ref 1.003–1.030)
Squamous Epithelial: 3
WBC UR: NONE SEEN /HPF (ref 0–5)

## 2014-07-08 LAB — BASIC METABOLIC PANEL
ANION GAP: 6 — AB (ref 7–16)
BUN: 20 mg/dL — AB (ref 7–18)
CO2: 26 mmol/L (ref 21–32)
CREATININE: 1.38 mg/dL — AB (ref 0.60–1.30)
Calcium, Total: 9.4 mg/dL (ref 8.5–10.1)
Chloride: 102 mmol/L (ref 98–107)
EGFR (African American): >60
GFR CALC NON AF AMER: 52 — AB
GLUCOSE: 162 mg/dL — AB (ref 65–99)
Osmolality: 274 (ref 275–301)
POTASSIUM: 4.1 mmol/L (ref 3.5–5.1)
Sodium: 134 mmol/L — ABNORMAL LOW (ref 136–145)

## 2014-07-08 LAB — CBC
HCT: 29.4 % — ABNORMAL LOW (ref 40.0–52.0)
HGB: 9.2 g/dL — ABNORMAL LOW (ref 13.0–18.0)
MCH: 26.8 pg (ref 26.0–34.0)
MCHC: 31.2 g/dL — AB (ref 32.0–36.0)
MCV: 86 fL (ref 80–100)
PLATELETS: 338 10*3/uL (ref 150–440)
RBC: 3.43 10*6/uL — ABNORMAL LOW (ref 4.40–5.90)
RDW: 14.5 % (ref 11.5–14.5)
WBC: 8.5 10*3/uL (ref 3.8–10.6)

## 2014-07-08 LAB — PROTIME-INR
INR: 1.2
Prothrombin Time: 14.9 secs — ABNORMAL HIGH (ref 11.5–14.7)

## 2014-07-08 LAB — APTT: Activated PTT: 35.5 secs (ref 23.6–35.9)

## 2014-07-10 ENCOUNTER — Inpatient Hospital Stay: Payer: Self-pay | Admitting: Vascular Surgery

## 2014-07-10 HISTORY — PX: OTHER SURGICAL HISTORY: SHX169

## 2014-07-12 LAB — PATHOLOGY REPORT

## 2014-07-17 ENCOUNTER — Other Ambulatory Visit: Payer: Self-pay | Admitting: *Deleted

## 2014-07-17 MED ORDER — OXYCODONE-ACETAMINOPHEN 5-325 MG PO TABS: ORAL_TABLET | ORAL | Status: DC

## 2014-07-17 NOTE — Telephone Encounter (Signed)
[facility] 

## 2014-07-18 ENCOUNTER — Non-Acute Institutional Stay (SKILLED_NURSING_FACILITY): Payer: Commercial Managed Care - HMO | Admitting: Adult Health

## 2014-07-18 DIAGNOSIS — Z89611 Acquired absence of right leg above knee: Secondary | ICD-10-CM

## 2014-07-18 DIAGNOSIS — E1159 Type 2 diabetes mellitus with other circulatory complications: Secondary | ICD-10-CM

## 2014-07-18 DIAGNOSIS — I2510 Atherosclerotic heart disease of native coronary artery without angina pectoris: Secondary | ICD-10-CM

## 2014-07-18 DIAGNOSIS — S78119A Complete traumatic amputation at level between unspecified hip and knee, initial encounter: Secondary | ICD-10-CM

## 2014-07-18 DIAGNOSIS — I10 Essential (primary) hypertension: Secondary | ICD-10-CM

## 2014-07-18 DIAGNOSIS — E785 Hyperlipidemia, unspecified: Secondary | ICD-10-CM

## 2014-07-18 DIAGNOSIS — I739 Peripheral vascular disease, unspecified: Secondary | ICD-10-CM

## 2014-07-21 ENCOUNTER — Encounter: Payer: Self-pay | Admitting: Adult Health

## 2014-07-21 DIAGNOSIS — E1165 Type 2 diabetes mellitus with hyperglycemia: Secondary | ICD-10-CM

## 2014-07-21 DIAGNOSIS — IMO0001 Reserved for inherently not codable concepts without codable children: Secondary | ICD-10-CM | POA: Insufficient documentation

## 2014-07-21 DIAGNOSIS — I2510 Atherosclerotic heart disease of native coronary artery without angina pectoris: Secondary | ICD-10-CM | POA: Insufficient documentation

## 2014-07-21 DIAGNOSIS — Z89619 Acquired absence of unspecified leg above knee: Secondary | ICD-10-CM | POA: Insufficient documentation

## 2014-07-21 DIAGNOSIS — I739 Peripheral vascular disease, unspecified: Secondary | ICD-10-CM | POA: Insufficient documentation

## 2014-07-21 DIAGNOSIS — I10 Essential (primary) hypertension: Secondary | ICD-10-CM | POA: Insufficient documentation

## 2014-07-21 DIAGNOSIS — E785 Hyperlipidemia, unspecified: Secondary | ICD-10-CM | POA: Insufficient documentation

## 2014-07-21 DIAGNOSIS — E1159 Type 2 diabetes mellitus with other circulatory complications: Secondary | ICD-10-CM | POA: Insufficient documentation

## 2014-07-21 NOTE — Progress Notes (Signed)
Patient ID: Stephen Camacho, male   DOB: 06-12-1946, 68 y.o.   MRN: [id number]     [facility]  No Known Allergies   Chief Complaint  Patient presents with  . Hospitalization Follow-up    HPI:  He has a history of pad; pvd; diabetes;. He had gangrene of his right foot and was hospitalized for a right aka. He is here for short term rehab with his goal to reutrn back home with his wife. He does help provide care for his wife at home.    Past Medical History  Diagnosis Date  . Hyperlipidemia   . Hypertension   . Diabetes mellitus without complication   . CAD (coronary artery disease)   . PVD (peripheral vascular disease)   . Seizures   . Gangrene of foot   . Wheezing     Past Surgical History  Procedure Laterality Date  . Right aka  07-10-2014  . Left femoral popliteal bypass    . Right lung lobeectomy    . Coronary artery bypass graft      VITAL SIGNS BP 147/70  Pulse 76  Ht 6\' 4"  (1.93 m)  Wt 135 lb 8 oz (61.462 kg)  BMI 16.50 kg/m2  SpO2 97%   Patient's Medications  New Prescriptions   No medications on file  Previous Medications   ASPIRIN 81 MG TABLET    Take 81 mg by mouth daily.   ATORVASTATIN (LIPITOR) 40 MG TABLET    Take 40 mg by mouth daily at 6 PM.   INSULIN LISPRO (HUMALOG) 100 UNIT/ML INJECTION    Inject 6 Units into the skin 2 (two) times daily before a meal.   ISOSORBIDE MONONITRATE (IMDUR) 60 MG 24 HR TABLET    Take 60 mg by mouth daily.   LISINOPRIL (PRINIVIL,ZESTRIL) 10 MG TABLET    Take 10 mg by mouth daily.   METFORMIN (GLUCOPHAGE) 500 MG TABLET    Take by mouth 2 (two) times daily with a meal.   METOPROLOL (LOPRESSOR) 50 MG TABLET    Take 50 mg by mouth 2 (two) times daily.   OXYCODONE-ACETAMINOPHEN (PERCOCET/ROXICET) 5-325 MG PER TABLET    Take one to two tablets by mouth every 6 hours as needed for pain  Modified Medications   No medications on file  Discontinued Medications   No medications on file    SIGNIFICANT DIAGNOSTIC  EXAMS  07-08-14: chest x-ray; 1. Interval cabg and pulmonary wedge resection no acute findings. 2. Subsegmental atelectasis or scarring right lung base    LABS REVIEWED:     Review of Systems  Constitutional: Negative for malaise/fatigue.  Respiratory: Negative for cough and shortness of breath.   Cardiovascular: Negative for chest pain, palpitations and leg swelling.  Gastrointestinal: Negative for heartburn, abdominal pain, diarrhea and constipation.  Musculoskeletal: Negative for back pain, joint pain and myalgias.  Skin: Negative.   Neurological: Negative for headaches.  Psychiatric/Behavioral: Negative for depression. The patient is not nervous/anxious.       Physical Exam  Constitutional: He is oriented to person, place, and time. No distress.  thin  Neck: Neck supple. No JVD present.  Cardiovascular: Normal rate and normal heart sounds.   Unable to palpate left post tib/pedal pulse   Respiratory: Effort normal and breath sounds normal. No respiratory distress. He has no wheezes.  GI: Soft. Bowel sounds are normal. He exhibits no distension. There is no tenderness.  Musculoskeletal: He exhibits no edema.  Able to move extremities; status post right  aka   Neurological: He is alert and oriented to person, place, and time.  Skin: Skin is warm and dry. He is not diaphoretic.  Right aka without signs of infection present. Coban wrap in place.   Psychiatric: He has a normal mood and affect.      ASSESSMENT/ PLAN:  1. Pvd/pad: is status post right aka: will continue therapy as directed will follow up with surgeon as indicated; will continue percocet 1 or 2 tabs every 6 hours as needed and will monitor   2. Diabetes: will continue metformin 500 mg twie daily and humalog 6 unit twice daily with meals  3. Dyslipidemia; will continue lipitor 40 mg daily   4. Cad status post cabg; no complaints of chest pain presently will continue imdur 60 mg daily and asa 81 mg daily   5.  Hypertension: will continue lisinopril 10 mg daily    Time spent with patient 50 minutes   Stephen Edwards NP [facility]  Contact [phone] Monday through Friday 8am- 5pm  After hours call [phone]

## 2014-07-22 ENCOUNTER — Non-Acute Institutional Stay (SKILLED_NURSING_FACILITY): Payer: Commercial Managed Care - HMO | Admitting: Internal Medicine

## 2014-07-22 ENCOUNTER — Encounter: Payer: Self-pay | Admitting: Internal Medicine

## 2014-07-22 DIAGNOSIS — S78119A Complete traumatic amputation at level between unspecified hip and knee, initial encounter: Secondary | ICD-10-CM

## 2014-07-22 DIAGNOSIS — Z89611 Acquired absence of right leg above knee: Secondary | ICD-10-CM

## 2014-07-22 DIAGNOSIS — I10 Essential (primary) hypertension: Secondary | ICD-10-CM

## 2014-07-22 DIAGNOSIS — E785 Hyperlipidemia, unspecified: Secondary | ICD-10-CM

## 2014-07-22 DIAGNOSIS — I2510 Atherosclerotic heart disease of native coronary artery without angina pectoris: Secondary | ICD-10-CM

## 2014-07-22 DIAGNOSIS — E1159 Type 2 diabetes mellitus with other circulatory complications: Secondary | ICD-10-CM

## 2014-07-22 DIAGNOSIS — I96 Gangrene, not elsewhere classified: Secondary | ICD-10-CM

## 2014-07-22 NOTE — Progress Notes (Signed)
Patient ID: Stephen Camacho, male   DOB: 08-Nov-1945, 68 y.o.   MRN: [id number]    Facility: [facility]   Code status- full code  Chief Complaint  Patient presents with  . New Admit To SNF   No Known Allergies  HPI 68 y/o male patient is here for STR after hospital admission with right foot gangrene. He underwent right above knee amputation. He is seen in his room today. He denies any pain. He has no concerns this visit. No concern from staff.  Review of Systems  Constitutional: Negative for fever, chills, diaphoresis.  HENT: Negative for congestion, hearing loss and sore throat.   Eyes: Negative for blurred vision, double vision and discharge.  Respiratory: Negative for cough, sputum production, shortness of breath and wheezing.   Cardiovascular: Negative for chest pain, palpitations, orthopnea and leg swelling.  Gastrointestinal: Negative for heartburn, nausea, vomiting, abdominal pain, diarrhea and constipation.  Genitourinary: Negative for dysuria, urgency, frequency and flank pain.  Musculoskeletal: Negative for back pain, falls, myalgias.  Skin: Negative for itching and rash.  Neurological:  Negative for dizziness, tingling, focal weakness and headaches.  Psychiatric/Behavioral: Negative for depression and memory loss. The patient is not nervous/anxious.    Past Medical History  Diagnosis Date  . Hyperlipidemia   . Hypertension   . Diabetes mellitus without complication   . CAD (coronary artery disease)   . PVD (peripheral vascular disease)   . Seizures   . Gangrene of foot   . Wheezing    Past Surgical History  Procedure Laterality Date  . Right aka  07-10-2014  . Left femoral popliteal bypass    . Right lung lobeectomy    . Coronary artery bypass graft     No family history on file.  History   Social History  . Marital Status: Married    Spouse Name: N/A    Number of Children: N/A  . Years of Education: N/A   Occupational  History  . Not on file.   Social History Main Topics  . Smoking status: Unknown If Ever Smoked  . Smokeless tobacco: Not on file  . Alcohol Use: Not on file  . Drug Use: Not on file  . Sexual Activity: Not on file   Other Topics Concern  . Not on file   Social History Narrative  . No narrative on file   Current Outpatient Prescriptions on File Prior to Visit  Medication Sig Dispense Refill  . aspirin 81 MG tablet Take 81 mg by mouth daily.      Marland Kitchen atorvastatin (LIPITOR) 40 MG tablet Take 40 mg by mouth daily at 6 PM.      . insulin lispro (HUMALOG) 100 UNIT/ML injection Inject 6 Units into the skin 2 (two) times daily before a meal.      . isosorbide mononitrate (IMDUR) 60 MG 24 hr tablet Take 60 mg by mouth daily.      Marland Kitchen lisinopril (PRINIVIL,ZESTRIL) 10 MG tablet Take 10 mg by mouth daily.      . metFORMIN (GLUCOPHAGE) 500 MG tablet Take by mouth 2 (two) times daily with a meal.      . metoprolol (LOPRESSOR) 50 MG tablet Take 50 mg by mouth 2 (two) times daily.      Marland Kitchen oxyCODONE-acetaminophen (PERCOCET/ROXICET) 5-325 MG per tablet Take one to two tablets by mouth every 6 hours as needed for pain  240 tablet  0   No current facility-administered medications on file prior to  visit.    Physical exam BP 140/77  Pulse 78  Temp(Src) 97.7 F (36.5 C)  Resp 18  SpO2 97%  Constitutional: He is thin built and in no distress Neck: Neck supple. No JVD present.  Cardiovascular: Normal rate and normal heart sounds.    Respiratory: Effort normal and breath sounds normal. No respiratory distress. He has no wheezes.  GI: Soft. Bowel sounds are normal. He exhibits no distension. There is no tenderness.  Musculoskeletal: He exhibits no edema. Right AKA with dressing in place. Able to move other extremities   Neurological: He is alert and oriented to person, place, and time.  Skin: Skin is warm and dry. He is not diaphoretic.  Psychiatric: He has a normal mood and affect.    Assessment/plan  ASSESSMENT/ PLAN:  Right foot gangrene S/p right AKA. Will have patient work with PT/OT as tolerated to regain strength and restore function.  Fall precautions are in place.continue percocet 1 or 2 tabs every 6 hours as needed and will monitor   Diabetes Monitor cbg and continue metformin 500 mg twie daily with humalog 6 unit twice daily with meals  Dyslipidemia continue lipitor 40 mg daily   HTN Continue lisinopril 10 mg daily  CAD S/p CABG. Chest pain free. Continue imdur, lisinopril and aspirin with lipitor    Labs- cbc, cmp  Family/ staff Communication: reviewed care plan with patient and nursing supervisor  Goal of care: short term rehabilitation

## 2014-07-31 ENCOUNTER — Non-Acute Institutional Stay (SKILLED_NURSING_FACILITY): Payer: Commercial Managed Care - HMO | Admitting: Adult Health

## 2014-07-31 DIAGNOSIS — E1151 Type 2 diabetes mellitus with diabetic peripheral angiopathy without gangrene: Secondary | ICD-10-CM

## 2014-07-31 DIAGNOSIS — Z89611 Acquired absence of right leg above knee: Secondary | ICD-10-CM

## 2014-07-31 DIAGNOSIS — I10 Essential (primary) hypertension: Secondary | ICD-10-CM

## 2014-07-31 DIAGNOSIS — E1159 Type 2 diabetes mellitus with other circulatory complications: Secondary | ICD-10-CM

## 2014-07-31 DIAGNOSIS — I739 Peripheral vascular disease, unspecified: Secondary | ICD-10-CM

## 2014-08-05 ENCOUNTER — Other Ambulatory Visit: Payer: Self-pay | Admitting: *Deleted

## 2014-08-05 MED ORDER — OXYCODONE-ACETAMINOPHEN 5-325 MG PO TABS: ORAL_TABLET | ORAL | Status: DC

## 2014-08-05 NOTE — Telephone Encounter (Signed)
[facility] 

## 2014-08-09 DIAGNOSIS — E1151 Type 2 diabetes mellitus with diabetic peripheral angiopathy without gangrene: Secondary | ICD-10-CM

## 2014-08-09 DIAGNOSIS — I2510 Atherosclerotic heart disease of native coronary artery without angina pectoris: Secondary | ICD-10-CM

## 2014-08-09 DIAGNOSIS — Z4781 Encounter for orthopedic aftercare following surgical amputation: Secondary | ICD-10-CM

## 2014-08-09 DIAGNOSIS — I10 Essential (primary) hypertension: Secondary | ICD-10-CM

## 2014-08-19 DIAGNOSIS — E1151 Type 2 diabetes mellitus with diabetic peripheral angiopathy without gangrene: Secondary | ICD-10-CM | POA: Insufficient documentation

## 2014-08-19 NOTE — Progress Notes (Signed)
Patient ID: Stephen Camacho, male   DOB: July 17, 1946, 68 y.o.   MRN: [id number]     [facility]  No Known Allergies   Chief Complaint  Patient presents with  . Discharge Note    HPI:  He is being discharged to home with home health for pt/ot/rn.aid. He will need a wheelchair and a 3:1 commode. He will need his prescriptions to be written and will need a follow up with his pcp. He had been hospitalized for a right aka   Past Medical History  Diagnosis Date  . Hyperlipidemia   . Hypertension   . Diabetes mellitus without complication   . CAD (coronary artery disease)   . PVD (peripheral vascular disease)   . Seizures   . Gangrene of foot   . Wheezing     Past Surgical History  Procedure Laterality Date  . Right aka  07-10-2014  . Left femoral popliteal bypass    . Right lung lobeectomy    . Coronary artery bypass graft      VITAL SIGNS BP 129/79  Pulse 72  Ht 6\' 4"  (1.93 m)  Wt 135 lb 8 oz (61.462 kg)  BMI 16.50 kg/m2   Patient's Medications  New Prescriptions   No medications on file  Previous Medications   ASPIRIN 81 MG TABLET    Take 81 mg by mouth daily.   ATORVASTATIN (LIPITOR) 40 MG TABLET    Take 40 mg by mouth daily at 6 PM.   INSULIN LISPRO (HUMALOG) 100 UNIT/ML INJECTION    Inject 6 Units into the skin 2 (two) times daily before a meal.   ISOSORBIDE MONONITRATE (IMDUR) 60 MG 24 HR TABLET    Take 60 mg by mouth daily.   LISINOPRIL (PRINIVIL,ZESTRIL) 10 MG TABLET    Take 10 mg by mouth daily.   METFORMIN (GLUCOPHAGE) 500 MG TABLET    Take by mouth 2 (two) times daily with a meal.   METOPROLOL (LOPRESSOR) 50 MG TABLET    Take 50 mg by mouth 2 (two) times daily.   OXYCODONE-ACETAMINOPHEN (PERCOCET/ROXICET) 5-325 MG PER TABLET    Take one to two tablets by mouth every 6 hours as needed for pain  Modified Medications   No medications on file  Discontinued Medications   No medications on file    SIGNIFICANT DIAGNOSTIC EXAMS   07-08-14: chest x-ray;  1. Interval cabg and pulmonary wedge resection no acute findings. 2. Subsegmental atelectasis or scarring right lung base    LABS REVIEWED:   07-23-14: wbc 9.5; hgb 8.8; hcy 29.8 ;mcv 81.5; plt 361; glucose 231; bun 15; creat 1.0; k+4.9 ;na++132; liver normal albumin 3.7; chol 100; ldl 38; trig 124     Review of Systems  Constitutional: Negative for malaise/fatigue.  Respiratory: Negative for cough and shortness of breath.   Cardiovascular: Negative for chest pain, palpitations and leg swelling.  Gastrointestinal: Negative for heartburn, abdominal pain, diarrhea and constipation.  Musculoskeletal: Negative for back pain, joint pain and myalgias.  Skin: Negative.   Neurological: Negative for headaches.  Psychiatric/Behavioral: Negative for depression. The patient is not nervous/anxious.       Physical Exam  Constitutional: He is oriented to person, place, and time. No distress.  thin  Neck: Neck supple. No JVD present.  Cardiovascular: Normal rate and normal heart sounds.   Unable to palpate left post tib/pedal pulse   Respiratory: Effort normal and breath sounds normal. No respiratory distress. He has no wheezes.  GI: Soft. Bowel sounds  are normal. He exhibits no distension. There is no tenderness.  Musculoskeletal: He exhibits no edema.  Able to move extremities; status post right aka   Neurological: He is alert and oriented to person, place, and time.  Skin: Skin is warm and dry. He is not diaphoretic.  Right aka without signs of infection present. Coban wrap in place.   Psychiatric: He has a normal mood and affect.      ASSESSMENT/ PLAN:  Will discharge him to home with home health for pt/ot/rn/aid: to improve upon strength; mobility; gait; indpendence with adl's; disease medication management and adl care. His prescriptions have been written for a 30 day supply for his medications with #30 percocet 5/325 mg tabs. He will need a wheelchair in order to maintain his current  level of independence which he cannot achieve with a walker. He will need a 3;1 commode. He has a follow up appointment scheduled with his pcp Flint Melter on 08-12-14 at 9 am.     Time spent with patient 45 minutes.        Ok Edwards NP [facility] Adult Medicine  Contact [phone] Monday through Friday 8am- 5pm  After hours call [phone]

## 2014-10-25 ENCOUNTER — Emergency Department: Payer: Self-pay | Admitting: Emergency Medicine

## 2014-10-25 LAB — CBC WITH DIFFERENTIAL/PLATELET
BASOS ABS: 0 10*3/uL (ref 0.0–0.1)
BASOS PCT: 0.8 %
EOS ABS: 0.2 10*3/uL (ref 0.0–0.7)
EOS PCT: 3.9 %
HCT: 38.1 % — ABNORMAL LOW (ref 40.0–52.0)
HGB: 12.1 g/dL — AB (ref 13.0–18.0)
Lymphocyte #: 1.6 10*3/uL (ref 1.0–3.6)
Lymphocyte %: 31.6 %
MCH: 27.3 pg (ref 26.0–34.0)
MCHC: 31.8 g/dL — ABNORMAL LOW (ref 32.0–36.0)
MCV: 86 fL (ref 80–100)
MONO ABS: 0.4 x10 3/mm (ref 0.2–1.0)
Monocyte %: 7.2 %
Neutrophil #: 2.8 10*3/uL (ref 1.4–6.5)
Neutrophil %: 56.5 %
Platelet: 151 10*3/uL (ref 150–440)
RBC: 4.45 10*6/uL (ref 4.40–5.90)
RDW: 15.1 % — ABNORMAL HIGH (ref 11.5–14.5)
WBC: 4.9 10*3/uL (ref 3.8–10.6)

## 2014-10-25 LAB — BASIC METABOLIC PANEL
ANION GAP: 8 (ref 7–16)
BUN: 17 mg/dL (ref 7–18)
Calcium, Total: 8.7 mg/dL (ref 8.5–10.1)
Chloride: 109 mmol/L — ABNORMAL HIGH (ref 98–107)
Co2: 21 mmol/L (ref 21–32)
Creatinine: 1.11 mg/dL (ref 0.60–1.30)
Glucose: 232 mg/dL — ABNORMAL HIGH (ref 65–99)
Osmolality: 285 (ref 275–301)
Potassium: 3.9 mmol/L (ref 3.5–5.1)
Sodium: 138 mmol/L (ref 136–145)

## 2014-10-25 LAB — URINALYSIS, COMPLETE
Bacteria: NONE SEEN
Bilirubin,UR: NEGATIVE
Blood: NEGATIVE
Glucose,UR: >=500 mg/dL (ref 0–75)
Hyaline Cast: 2
KETONE: NEGATIVE
Leukocyte Esterase: NEGATIVE
Nitrite: NEGATIVE
Ph: 5 (ref 4.5–8.0)
RBC,UR: 1 /HPF (ref 0–5)
SPECIFIC GRAVITY: 1.023 (ref 1.003–1.030)
Squamous Epithelial: <1

## 2014-10-25 LAB — TROPONIN I: Troponin-I: <0.02 ng/mL

## 2014-12-18 ENCOUNTER — Ambulatory Visit: Payer: PPO | Attending: Vascular Surgery | Admitting: Physical Therapy

## 2014-12-18 DIAGNOSIS — R2689 Other abnormalities of gait and mobility: Secondary | ICD-10-CM

## 2014-12-18 DIAGNOSIS — Z89611 Acquired absence of right leg above knee: Secondary | ICD-10-CM | POA: Diagnosis not present

## 2014-12-18 DIAGNOSIS — Z4781 Encounter for orthopedic aftercare following surgical amputation: Secondary | ICD-10-CM | POA: Diagnosis not present

## 2014-12-18 DIAGNOSIS — Z7409 Other reduced mobility: Secondary | ICD-10-CM

## 2014-12-18 DIAGNOSIS — R269 Unspecified abnormalities of gait and mobility: Secondary | ICD-10-CM | POA: Insufficient documentation

## 2014-12-18 NOTE — Therapy (Signed)
[facility] [address] [place], [place], [address] Phone: [phone]   Fax:  [phone]  Physical Therapy Evaluation  Patient Details  Name: Stephen Camacho MRN: [id number] Date of Birth: [birth year] Referring Provider:  No ref. provider found  Encounter Date: 12/18/2014      PT End of Session - 12/18/14 2202    Visit Number 1   Number of Visits 17   Date for PT Re-Evaluation 02/14/15   PT Start Time 6720   PT Stop Time 1110   PT Time Calculation (min) 55 min   Equipment Utilized During Treatment Gait belt   Activity Tolerance Patient tolerated treatment well   Behavior During Therapy WFL for tasks assessed/performed      Past Medical History  Diagnosis Date  . Hyperlipidemia   . Hypertension   . Diabetes mellitus without complication   . CAD (coronary artery disease)   . PVD (peripheral vascular disease)   . Seizures   . Gangrene of foot   . Wheezing     Past Surgical History  Procedure Laterality Date  . Right aka  07-10-2014  . Left femoral popliteal bypass    . Right lung lobeectomy    . Coronary artery bypass graft      There were no vitals taken for this visit.  Visit Diagnosis:  Abnormality of gait  Balance problems  Impaired functional mobility and activity tolerance  Status post above knee amputation of right lower extremity      Subjective Assessment - 12/18/14 1034    Symptoms This 69yo male underwent a right Transfemoral Amputation on 07/10/14 and recieved first prosthesis 1-2 weeks ago. He is dependent in use & care. Patient presents for Physical Therapy evaluation for prosthetics.    Patient Stated Goals Wants to use prosthesis to walk in home & community.   Currently in Pain? No/denies          [facility] PT Assessment - 12/18/14 1015    Assessment   Medical Diagnosis right Transfemoral Amputation   Precautions   Precautions Fall   Restrictions   Weight Bearing  Restrictions No   Balance Screen   Has the patient fallen in the past 6 months Yes   How many times? multiple after amputation, none in last 1-2 months   Has the patient had a decrease in activity level because of a fear of falling?  No   Is the patient reluctant to leave their home because of a fear of falling?  No   Home Environment   Living Enviornment Private residence   Living Arrangements Alone  daughter comes over to help   Type of [place] to enter;Level entry  no steps at back (primary) door, 1 at front   [employer] of Steps 1   [place] One level   Prior Function   Level of Independence Independent with basic ADLs;Independent with homemaking with ambulation;Independent with gait;Independent with transfers   Vocation Retired   [employer]   Overall Cognitive Status History of cognitive impairments - at baseline   Posture/Postural Control   Posture/Postural Control Postural limitations   Postural Limitations Rounded Shoulders;Forward head;Flexed trunk;Weight shift left   ROM / Strength   AROM / PROM / Strength AROM;Strength   AROM   Overall AROM  Within functional limits for tasks performed   Strength   Overall Strength Within functional limits for tasks performed   Transfers  Transfers Sit to Stand;Stand to Sit   Sit to Stand 5: Supervision;With upper extremity assist;With armrests;From chair/3-in-1  RW needed to stabalize, cues on prosthetic knee control    Stand to Sit 5: Supervision;With upper extremity assist;With armrests;To chair/3-in-1  RW required, cues on prosthetic knee control   Ambulation/Gait   Ambulation/Gait Yes   Ambulation/Gait Assistance 4: Min guard   Ambulation Distance (Feet) 125 Feet   Assistive device Rolling walker;Prosthesis   Gait Pattern Step-to pattern;Decreased step length - left;Decreased stance time - right;Decreased stride length;Decreased hip/knee flexion -  right;Decreased weight shift to right;Right circumduction;Right hip hike;Right flexed knee in stance;Trunk flexed;Abducted- right;Poor foot clearance - right   Ambulation Surface Level;Indoor   Gait velocity 0.52 ft/sec   Static Standing Balance   Static Standing - Balance Support No upper extremity supported;During functional activity   Static Standing - Level of Assistance 4: Min assist   Static Standing Balance -  Activities  --  managing pants   Static Standing - Comment/# of Minutes 30 seconds   Dynamic Standing Balance   Dynamic Standing - Balance Support Left upper extremity supported  RW support   Dynamic Standing - Level of Assistance 4: Min assist   Dynamic Standing - Balance Activities Reaching for objects;Head turns   Dynamic Standing - Comments reaches 4" and looks to sides only         Prosthetics Assessment - 12/18/14 0001    Prosthetics   Prosthetic Care Dependent with Skin check;Residual limb care;Care of non-amputated limb;Prosthetic cleaning;Ply sock cleaning;Correct ply sock adjustment;Proper wear schedule/adjustment;Proper weight-bearing schedule/adjustment  proper donning   Donning prosthesis  Min assist   Doffing prosthesis  Min assist                 [facility] Adult PT Treatment/Exercise - 12/18/14 1015    Prosthetics   Current prosthetic wear tolerance (days/week)  3 days since delivery 12 days   Current prosthetic wear tolerance (#hours/day)  ~1 hour   Current prosthetic weight-bearing tolerance (hours/day)  5 minutes with PWB  on prosthesis with RW without pain or discomfort   Residual limb condition  no open areas, normal color, temperature & hair growth,    Education Provided Skin check;Residual limb care;Care of non-amputated limb;Prosthetic cleaning;Ply sock cleaning;Correct ply sock adjustment;Proper wear schedule/adjustment;Proper weight-bearing schedule/adjustment  donning prosthesis   Person(s) Educated Patient;Other (comment)  brother    Education Method Explanation;Demonstration;Tactile cues;Verbal cues   Education Method Verbalized understanding;Returned demonstration;Verbal cues required;Tactile cues required;Needs further instruction   Donning Prosthesis Moderate assist   Doffing Prosthesis Supervision                PT Education - 12/18/14 1115    Education provided Yes   Education Details see prosthetic instruction   Person(s) Educated Patient;Other (comment)   Methods Explanation;Demonstration;Tactile cues;Verbal cues   Comprehension Verbalized understanding;Returned demonstration;Verbal cues required;Tactile cues required;Need further instruction          PT Short Term Goals - 12/18/14 1115    PT SHORT TERM GOAL #1   Title donnes prosthesis correctly & independently (Target Date: 01/17/15)   Time 4   Period Weeks   Status New   PT SHORT TERM GOAL #2   Title wears prosthesis >8 hours /day without change in skin integrity or tenderness. (Target Date: 01/17/15)   Time 4   Period Weeks   Status New   PT SHORT TERM GOAL #3   Title Patient ambulates 200' with RW & prosthesis modified independent. (  Target Date: 01/17/15)   Time 4   Period Weeks   Status New   PT SHORT TERM GOAL #4   Title Patient negotiates ramp, curb with rolling walker & stairs with 2 rails with prothesis with superivsion. (Target Date: 01/17/15)   Time 4   Period Weeks   Status New   PT SHORT TERM GOAL #5   Title reaches 5" without UE support with superivision. (Target Date: 01/17/15)   Time 4   Period Weeks   Status New           PT Long Term Goals - January 16, 2015 1115    PT LONG TERM GOAL #1   Title Patient demonstrates / verbalizes proper prosthetic care. (Target Date: 02/14/15)   Time 8   Period Weeks   Status New   PT LONG TERM GOAL #2   Title Patient tolerates wear >90% of awake hours without change in skin integrity or tenderness. (Target Date: 02/14/15)   Time 8   Period Weeks   Status New   PT LONG TERM GOAL #3    Title Patient ambulates 400' with LRAD and prosthesis modified indpendently. (Target Date: 02/14/15)   Time 8   Period Weeks   Status New   PT LONG TERM GOAL #4   Title Patient negotiates ramp, curb and stairs with LRAD & prosthesis modified independent. (Target Date: 02/14/15)   Time 8   Period Weeks   Status New   PT LONG TERM GOAL #5   Title Berg Balance >/= 20/56 (Target Date: 02/14/15)   Time 8   Period Weeks   Status New               Plan - Jan 16, 2015 1115    Clinical Impression Statement This 69yo male underwent a right Transfemoral Amputation on 07/10/14 due to non-healing diabetic wound. He recieved his first prosthesis 12 days ago and is dependent in use/care. Patient is dependent in gait & balance with his prosthesis.              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   PT Frequency 2x / week   PT Duration 8 weeks   PT Treatment/Interventions ADLs/Self Care Home Management;DME Instruction;Gait training;Stair training;Functional mobility training;Therapeutic activities;Therapeutic exercise;Balance training;Neuromuscular re-education;Patient/family education;Other (comment)  prosthetic training   PT Next Visit Plan review prosthetic care, gait with RW including barriers   PT Home Exercise Plan midline at sink   Consulted and Agree with Plan of Care Patient;Family member/caregiver   Family Member Consulted brother          G-Codes - 01/16/2015 2220/02/01    Functional Assessment Tool Used wears prosthesis 1 hour 3 of 12 days since delivery, dependent in prosthetic care.   Functional Limitation Self care   Self Care Current Status [phone]) At least 80 percent but less than 100  percent impaired, limited or restricted   Self Care Goal Status ([id number]) At least 1 percent but less than 20 percent impaired, limited or restricted       Problem List Patient Active Problem List   Diagnosis Date Noted  . 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 Gangrene 07/22/2014  . PVD (peripheral vascular disease) 07/21/2014  . PAD (peripheral artery disease) 07/21/2014  .  S/P AKA (above knee amputation) 07/21/2014  . CAD (coronary artery disease) 07/21/2014  . Dyslipidemia 07/21/2014  . Essential hypertension, benign 07/21/2014    Jamey Reas PT, DPT Physical Therapist Specializing in Prosthetics 12/18/2014, 10:23 PM  [facility] [address] [place], [place], [address] Phone: [phone]   Fax:  [phone]

## 2014-12-20 ENCOUNTER — Encounter: Payer: PPO | Admitting: Physical Therapy

## 2014-12-26 ENCOUNTER — Ambulatory Visit: Payer: PPO | Attending: Family Medicine | Admitting: Physical Therapy

## 2014-12-26 DIAGNOSIS — R269 Unspecified abnormalities of gait and mobility: Secondary | ICD-10-CM | POA: Insufficient documentation

## 2014-12-26 DIAGNOSIS — Z4781 Encounter for orthopedic aftercare following surgical amputation: Secondary | ICD-10-CM | POA: Insufficient documentation

## 2014-12-26 DIAGNOSIS — R2689 Other abnormalities of gait and mobility: Secondary | ICD-10-CM

## 2014-12-26 DIAGNOSIS — Z7409 Other reduced mobility: Secondary | ICD-10-CM

## 2014-12-26 DIAGNOSIS — Z89611 Acquired absence of right leg above knee: Secondary | ICD-10-CM | POA: Diagnosis not present

## 2014-12-27 ENCOUNTER — Encounter: Payer: Self-pay | Admitting: Physical Therapy

## 2014-12-27 ENCOUNTER — Ambulatory Visit: Payer: PPO | Admitting: Physical Therapy

## 2014-12-27 NOTE — Therapy (Signed)
[facility] [address] [place], [place], [address] Phone: [phone]   Fax:  [phone]  Physical Therapy Treatment  Patient Details  Name: Stephen Camacho MRN: [id number] Date of Birth: 05/15/1946 Referring Provider:  No ref. provider found  Encounter Date: 12/26/2014      PT End of Session - 12/26/14 0930    Visit Number 2   Number of Visits 17   Date for PT Re-Evaluation 02/14/15   PT Start Time 0930   PT Stop Time 1015   PT Time Calculation (min) 45 min   Equipment Utilized During Treatment Gait belt   Activity Tolerance Patient tolerated treatment well   Behavior During Therapy WFL for tasks assessed/performed      Past Medical History  Diagnosis Date  . Hyperlipidemia   . Hypertension   . Diabetes mellitus without complication   . CAD (coronary artery disease)   . PVD (peripheral vascular disease)   . Seizures   . Gangrene of foot   . Wheezing     Past Surgical History  Procedure Laterality Date  . Right aka  07-10-2014  . Left femoral popliteal bypass    . Right lung lobeectomy    . Coronary artery bypass graft      There were no vitals taken for this visit.  Visit Diagnosis:  Abnormality of gait  Balance problems  Impaired functional mobility and activity tolerance  Status post above knee amputation of right lower extremity      Subjective Assessment - 12/26/14 0930    Symptoms Reports wore prosthesis 2 times. Brother reports needs review of tightening strap   Currently in Pain? No/denies     Prosthetic Training:  PT instructed with patient performing donning including tighten strap and adjusting ply socks with verbal cues. PT demo proper sequence  & technique with RW & prosthesis. Patient ambulated 100' X 2 with tactile & verbal cues. PT demo / instructed in negotiating stairs, curb & ramp. Pt neg 4 steps with 2 rails and ramp /curb with  minA.                       PT Education - 12/26/14 0930    Education provided Yes   Education Details Donning prosthesis, adjusting ply socks, negotiating stairs, curb & ramp, sequence for gait including knee control   Person(s) Educated Patient;Other (comment)  brother   Methods Explanation;Demonstration   Comprehension Verbalized understanding;Returned demonstration;Need further instruction          PT Short Term Goals - 12/18/14 1115    PT SHORT TERM GOAL #1   Title donnes prosthesis correctly & independently (Target Date: 01/17/15)   Time 4   Period Weeks   Status New   PT SHORT TERM GOAL #2   Title wears prosthesis >8 hours /day without change in skin integrity or tenderness. (Target Date: 01/17/15)   Time 4   Period Weeks   Status New   PT SHORT TERM GOAL #3   Title Patient ambulates 200' with RW & prosthesis modified independent. (Target Date: 01/17/15)   Time 4   Period Weeks   Status New   PT SHORT TERM GOAL #4   Title Patient negotiates ramp, curb with rolling walker & stairs with 2 rails with prothesis with superivsion. (Target Date: 01/17/15)   Time 4   Period Weeks   Status New   PT SHORT TERM GOAL #5   Title reaches 5" without  UE support with superivision. (Target Date: 01/17/15)   Time 4   Period Weeks   Status New           PT Long Term Goals - 12/18/14 1115    PT LONG TERM GOAL #1   Title Patient demonstrates / verbalizes proper prosthetic care. (Target Date: 02/14/15)   Time 8   Period Weeks   Status New   PT LONG TERM GOAL #2   Title Patient tolerates wear >90% of awake hours without change in skin integrity or tenderness. (Target Date: 02/14/15)   Time 8   Period Weeks   Status New   PT LONG TERM GOAL #3   Title Patient ambulates 400' with LRAD and prosthesis modified indpendently. (Target Date: 02/14/15)   Time 8   Period Weeks   Status New   PT LONG TERM GOAL #4   Title Patient negotiates ramp, curb and stairs with LRAD  & prosthesis modified independent. (Target Date: 02/14/15)   Time 8   Period Weeks   Status New   PT LONG TERM GOAL #5   Title Berg Balance >/= 20/56 (Target Date: 02/14/15)   Time 8   Period Weeks   Status New               Plan - 12/26/14 0930    Clinical Impression Statement Patient appears to understand donning, adjusting ply socks and gait including barriers at basic level to try at home but needs more instruction to improve function & safety.   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   PT Frequency 2x / week   PT Duration 8 weeks   PT Treatment/Interventions ADLs/Self Care Home Management;DME Instruction;Gait training;Stair training;Functional mobility training;Therapeutic activities;Therapeutic exercise;Balance training;Neuromuscular re-education;Patient/family education;Other (comment)  prosthetic training   PT Next Visit Plan review prosthetic care, gait with RW including barriers   PT Home Exercise Plan midline at sink   Consulted and Agree with Plan of Care Patient;Family member/caregiver   Family Member Consulted brother        Problem List Patient Active Problem List   Diagnosis Date Noted  . 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 Gangrene 07/22/2014  . PVD (peripheral vascular disease) 07/21/2014  . PAD (peripheral artery disease) 07/21/2014  . S/P AKA (above knee amputation) 07/21/2014  . CAD (coronary artery disease) 07/21/2014  . Dyslipidemia 07/21/2014  . Essential hypertension, benign 07/21/2014    Jamey Reas PT, DPT Physical Therapist Specializing in Prosthetics 12/27/2014, 8:39 AM  [facility] [address] [place], [place], [address] Phone: [phone]   Fax:  [phone]

## 2015-01-01 ENCOUNTER — Ambulatory Visit: Payer: PPO | Admitting: Physical Therapy

## 2015-01-03 ENCOUNTER — Ambulatory Visit: Payer: PPO | Admitting: Physical Therapy

## 2015-01-03 ENCOUNTER — Encounter: Payer: Self-pay | Admitting: Physical Therapy

## 2015-01-03 DIAGNOSIS — R2689 Other abnormalities of gait and mobility: Secondary | ICD-10-CM

## 2015-01-03 DIAGNOSIS — R269 Unspecified abnormalities of gait and mobility: Secondary | ICD-10-CM

## 2015-01-03 DIAGNOSIS — Z4781 Encounter for orthopedic aftercare following surgical amputation: Secondary | ICD-10-CM | POA: Diagnosis not present

## 2015-01-03 NOTE — Patient Instructions (Signed)
Do each exercise 1-2  times per day Do each exercise 10-15 repetitions Hold each exercise for 3-5 seconds to feel your location  AT [place].  USE TAPE ON FLOOR TO MARK THE MIDLINE POSITION. You also should try to feel with your limb pressure in socket.  You are trying to feel with limb what you used to feel with the bottom of your foot.  1. Side to Side Shift: Moving your hips only (not shoulders): move weight onto your left leg, HOLD/FEEL.  Move back to equal weight on each leg, HOLD/FEEL. Move weight onto your right leg, HOLD/FEEL. Move back to equal weight on each leg, HOLD/FEEL. Repeat. 2. Front to Back Shift: Moving your hips only (not shoulders): move your weight forward onto your toes, HOLD/FEEL. Move your weight back to equal Flat Foot on both legs, HOLD/FEEL. Move your weight back onto your heels, HOLD/FEEL. Move your weight back to equal on both legs, HOLD/FEEL. Repeat. 3. Moving Cones / Cups: With equal weight on each leg: Hold on with one hand the first time, then progress to no hand supports. Move cups from one side of sink to the other. Place cups ~2" out of your reach, progress to 10" beyond reach. 4. Overhead/Upward Reaching: alternated reaching up to top cabinets or ceiling if no cabinets present. Keep equal weight on each leg. Start with one hand support on counter while other hand reaches and progress to no hand support with reaching. 5.   Looking Over Shoulders: With equal weight on each leg: alternate turning to look  over your shoulders with one hand support on counter as needed. Shift weight to             side looking, pull hip then shoulder then head/eyes around to look behind you. Start with one hand support & progress to no hand support.

## 2015-01-03 NOTE — Therapy (Signed)
[facility] [address] [place], [place], [address] Phone: [phone]   Fax:  [phone]  Physical Therapy Treatment  Patient Details  Name: Stephen Camacho MRN: [id number] Date of Birth: [birth year] Referring Provider:  No ref. provider found  Encounter Date: 01/03/2015      PT End of Session - 01/03/15 0857    Visit Number 3   Number of Visits 17   Date for PT Re-Evaluation 02/14/15   PT Start Time 0847   PT Stop Time 0932   PT Time Calculation (min) 45 min   Equipment Utilized During Treatment Gait belt   Activity Tolerance Patient tolerated treatment well   Behavior During Therapy WFL for tasks assessed/performed      Past Medical History  Diagnosis Date  . Hyperlipidemia   . Hypertension   . Diabetes mellitus without complication   . CAD (coronary artery disease)   . PVD (peripheral vascular disease)   . Seizures   . Gangrene of foot   . Wheezing     Past Surgical History  Procedure Laterality Date  . Right aka  07-10-2014  . Left femoral popliteal bypass    . Right lung lobeectomy    . Coronary artery bypass graft      There were no vitals filed for this visit.  Visit Diagnosis:  Balance problems  Abnormality of gait      Subjective Assessment - 01/03/15 0856    Symptoms No falls or pain to report.    Currently in Pain? No/denies           [facility] Adult PT Treatment/Exercise - 01/03/15 0857    Transfers   Sit to Stand 5: Supervision;With upper extremity assist;From chair/3-in-1;With armrests   Stand to Sit 5: Supervision;With upper extremity assist;To chair/3-in-1;With armrests   Ambulation/Gait   Ambulation/Gait Yes   Ambulation/Gait Assistance 4: Min guard;4: Min assist   Ambulation/Gait Assistance Details cues on posture, step lenght with left side and prosthetic advancement and knee control with gait.   Ambulation Distance (Feet) 110 Feet  x1, 50 x1 and 60 x 2   Assistive  device Rolling walker;Prosthesis   Gait Pattern Step-to pattern;Step-through pattern;Decreased stride length;Decreased stance time - right;Decreased step length - left;Decreased trunk rotation;Trunk flexed;Narrow base of support   Ambulation Surface Level;Indoor   Prosthetics   Prosthetic Care Comments  pt educated on and performed sink HEP for balance and  proprioception. Written instrucitons issued.                                   Current prosthetic wear tolerance (days/week)  currently 3-4 days a week  instructed to increase to 7 days a week   Current prosthetic wear tolerance (#hours/day)  1 hours 2 x a day  instructed pt to increase to 2 hours 2 x day   Residual limb condition  intact per pt report   Education Provided Residual limb care;Correct ply sock adjustment;Proper wear schedule/adjustment  sink HEP   Person(s) Educated Patient;Other (comment)  brother   Education Method Explanation;Verbal cues   Education Method Verbalized understanding;Needs further instruction   Donning Prosthesis Supervision   Doffing Prosthesis Supervision           PT Short Term Goals - 12/18/14 1115    PT SHORT TERM GOAL #1   Title donnes prosthesis correctly & independently (Target Date: 01/17/15)   Time 4  Period Weeks   Status New   PT SHORT TERM GOAL #2   Title wears prosthesis >8 hours /day without change in skin integrity or tenderness. (Target Date: 01/17/15)   Time 4   Period Weeks   Status New   PT SHORT TERM GOAL #3   Title Patient ambulates 200' with RW & prosthesis modified independent. (Target Date: 01/17/15)   Time 4   Period Weeks   Status New   PT SHORT TERM GOAL #4   Title Patient negotiates ramp, curb with rolling walker & stairs with 2 rails with prothesis with superivsion. (Target Date: 01/17/15)   Time 4   Period Weeks   Status New   PT SHORT TERM GOAL #5   Title reaches 5" without UE support with superivision. (Target Date: 01/17/15)   Time 4   Period Weeks    Status New           PT Long Term Goals - 12/18/14 1115    PT LONG TERM GOAL #1   Title Patient demonstrates / verbalizes proper prosthetic care. (Target Date: 02/14/15)   Time 8   Period Weeks   Status New   PT LONG TERM GOAL #2   Title Patient tolerates wear >90% of awake hours without change in skin integrity or tenderness. (Target Date: 02/14/15)   Time 8   Period Weeks   Status New   PT LONG TERM GOAL #3   Title Patient ambulates 400' with LRAD and prosthesis modified indpendently. (Target Date: 02/14/15)   Time 8   Period Weeks   Status New   PT LONG TERM GOAL #4   Title Patient negotiates ramp, curb and stairs with LRAD & prosthesis modified independent. (Target Date: 02/14/15)   Time 8   Period Weeks   Status New   PT LONG TERM GOAL #5   Title Berg Balance >/= 20/56 (Target Date: 02/14/15)   Time [place]   Status New           Plan - 01/03/15 0857    Clinical Impression Statement Pt and brother educated on HEP for balance and proprioception today. Both appeared to have a good understanding of it. Pt progressing toward goals.   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   PT Frequency 2x / week   PT Duration 8 weeks   PT Treatment/Interventions ADLs/Self Care Home Management;DME Instruction;Gait training;Stair training;Functional mobility training;Therapeutic activities;Therapeutic exercise;Balance training;Neuromuscular re-education;Patient/family education;Other (comment)  prosthetic training   PT Next Visit Plan gait with RW including barriers   PT Home Exercise Plan midline at sink   Consulted and Agree with Plan of Care Patient;Family member/caregiver    Family Member Consulted brother      Problem List Patient Active Problem List   Diagnosis Date Noted  . 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 Gangrene 07/22/2014  . PVD (peripheral vascular disease) 07/21/2014  . PAD (peripheral artery disease) 07/21/2014  . S/P AKA (above knee amputation) 07/21/2014  . CAD (coronary artery disease) 07/21/2014  . Dyslipidemia 07/21/2014  . Essential hypertension, benign 07/21/2014    Willow Ora 01/03/2015, 10:55 AM  Willow Ora, PTA, [facility] [address], [place], [place] [address] [phone]  01/03/2015, 10:55 AM

## 2015-01-07 ENCOUNTER — Encounter: Payer: Self-pay | Admitting: Physical Therapy

## 2015-01-07 ENCOUNTER — Ambulatory Visit: Payer: PPO | Admitting: Physical Therapy

## 2015-01-07 DIAGNOSIS — Z7409 Other reduced mobility: Secondary | ICD-10-CM

## 2015-01-07 DIAGNOSIS — R269 Unspecified abnormalities of gait and mobility: Secondary | ICD-10-CM

## 2015-01-07 DIAGNOSIS — Z4781 Encounter for orthopedic aftercare following surgical amputation: Secondary | ICD-10-CM | POA: Diagnosis not present

## 2015-01-07 NOTE — Therapy (Signed)
[facility] [address] [place], [place], [address] Phone: [phone]   Fax:  [phone]  Physical Therapy Treatment  Patient Details  Name: Stephen Camacho MRN: [id number] Date of Birth: 05-Aug-1946 Referring Provider:  No ref. provider found  Encounter Date: 01/07/2015      PT End of Session - 01/07/15 0937    Visit Number 4   Number of Visits 17   Date for PT Re-Evaluation 02/14/15   PT Start Time 0932   PT Stop Time 1018   PT Time Calculation (min) 46 min   Equipment Utilized During Treatment Gait belt   Activity Tolerance Patient tolerated treatment well   Behavior During Therapy WFL for tasks assessed/performed      Past Medical History  Diagnosis Date  . Hyperlipidemia   . Hypertension   . Diabetes mellitus without complication   . CAD (coronary artery disease)   . PVD (peripheral vascular disease)   . Seizures   . Gangrene of foot   . Wheezing     Past Surgical History  Procedure Laterality Date  . Right aka  07-10-2014  . Left femoral popliteal bypass    . Right lung lobeectomy    . Coronary artery bypass graft      There were no vitals filed for this visit.  Visit Diagnosis:  Abnormality of gait  Impaired functional mobility and activity tolerance      Subjective Assessment - 01/07/15 0937    Symptoms No falls or pain to report.    Currently in Pain? No/denies          [facility] Adult PT Treatment/Exercise - 01/07/15 0938    Ambulation/Gait   Ambulation/Gait Yes   Ambulation/Gait Assistance 4: Min guard   Ambulation/Gait Assistance Details cues on posture, increased left step length to pass right stace foot, cues on walker position with gait as well.                        Ambulation Distance (Feet) 120 Feet   Assistive device Rolling walker;Prosthesis   Gait Pattern Step-to pattern;Step-through pattern;Decreased stride length;Decreased stance time - right;Decreased step length  - left;Decreased trunk rotation;Trunk flexed;Narrow base of support   Ambulation Surface Level;Indoor   Stairs Yes   Stairs Assistance 4: Min assist   Stairs Assistance Details (indicate cue type and reason) cues on upright posture, sequence and for prosthetic knee stability in stance                    Stair Management Technique Two rails;Step to pattern;Forwards   Number of Stairs 4   Ramp 3: Mod assist  with walker/prosthesis x 1 rep   Ramp Details (indicate cue type and reason) cues on upright posture, sequence and for prosthetic knee stability   Curb 4: Min assist  with walker/prosthesis x 1 rep   Curb Details (indicate cue type and reason) cues for upright posture, stance position to move walker and on sequence.   Knee/Hip Exercises: Aerobic   Stationary Bike Scifit x 4 extremities level 2.0 x 8 minutes with goal rpm >/= 60 for strengthening and activity tolerance   Prosthetics   Current prosthetic wear tolerance (days/week)  7 days   Current prosthetic wear tolerance (#hours/day)  3-4 hours 2 x day  all day on Sunday   Residual limb condition  intact per pt report   Education Provided Residual limb care;Correct ply sock adjustment;Proper wear schedule/adjustment  drying throughout day for skin integrity   Person(s) Educated Patient   Education Method Explanation;Verbal cues   Education Method Verbalized understanding;Needs further instruction   Donning Prosthesis Supervision   Doffing Prosthesis Supervision           PT Short Term Goals - 12/18/14 1115    PT SHORT TERM GOAL #1   Title donnes prosthesis correctly & independently (Target Date: 01/17/15)   Time 4   Period Weeks   Status New   PT SHORT TERM GOAL #2   Title wears prosthesis >8 hours /day without change in skin integrity or tenderness. (Target Date: 01/17/15)   Time 4   Period Weeks   Status New   PT SHORT TERM GOAL #3   Title Patient ambulates 200' with RW & prosthesis modified independent. (Target Date:  01/17/15)   Time 4   Period Weeks   Status New   PT SHORT TERM GOAL #4   Title Patient negotiates ramp, curb with rolling walker & stairs with 2 rails with prothesis with superivsion. (Target Date: 01/17/15)   Time 4   Period Weeks   Status New   PT SHORT TERM GOAL #5   Title reaches 5" without UE support with superivision. (Target Date: 01/17/15)   Time 4   Period Weeks   Status New           PT Long Term Goals - 12/18/14 1115    PT LONG TERM GOAL #1   Title Patient demonstrates / verbalizes proper prosthetic care. (Target Date: 02/14/15)   Time 8   Period Weeks   Status New   PT LONG TERM GOAL #2   Title Patient tolerates wear >90% of awake hours without change in skin integrity or tenderness. (Target Date: 02/14/15)   Time 8   Period Weeks   Status New   PT LONG TERM GOAL #3   Title Patient ambulates 400' with LRAD and prosthesis modified indpendently. (Target Date: 02/14/15)   Time 8   Period Weeks   Status New   PT LONG TERM GOAL #4   Title Patient negotiates ramp, curb and stairs with LRAD & prosthesis modified independent. (Target Date: 02/14/15)   Time 8   Period Weeks   Status New   PT LONG TERM GOAL #5   Title Berg Balance >/= 20/56 (Target Date: 02/14/15)   Time [place]   Status New           Plan - 01/07/15 0937    Clinical Impression Statement Pt making good progress toward goals with increased wear/use of prosthesis and increased activity tolerance with today's session.   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   PT Home Exercise Plan midline at sink   Consulted and Agree with Plan of Care Patient;Family member/caregiver   Family Member Consulted brother      Problem List Patient Active Problem List   Diagnosis Date Noted  .  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 Gangrene 07/22/2014  . PVD (peripheral vascular disease) 07/21/2014  . PAD (peripheral artery disease) 07/21/2014  . S/P AKA (above knee amputation) 07/21/2014  . CAD (coronary artery disease) 07/21/2014  . Dyslipidemia 07/21/2014  . Essential hypertension, benign 07/21/2014    Willow Ora 01/07/2015, 10:54 PM  Willow Ora, PTA, [facility] [address], [place], [place] [address] [phone] 01/07/2015, 10:54 PM

## 2015-01-09 ENCOUNTER — Encounter: Payer: Self-pay | Admitting: Physical Therapy

## 2015-01-09 ENCOUNTER — Ambulatory Visit: Payer: PPO | Admitting: Physical Therapy

## 2015-01-09 DIAGNOSIS — R269 Unspecified abnormalities of gait and mobility: Secondary | ICD-10-CM

## 2015-01-09 DIAGNOSIS — Z7409 Other reduced mobility: Secondary | ICD-10-CM

## 2015-01-09 DIAGNOSIS — Z89611 Acquired absence of right leg above knee: Secondary | ICD-10-CM

## 2015-01-09 DIAGNOSIS — Z4781 Encounter for orthopedic aftercare following surgical amputation: Secondary | ICD-10-CM | POA: Diagnosis not present

## 2015-01-09 DIAGNOSIS — R2689 Other abnormalities of gait and mobility: Secondary | ICD-10-CM

## 2015-01-09 NOTE — Therapy (Signed)
[facility] [address] [place], [place], [address] Phone: [phone]   Fax:  [phone]  Physical Therapy Treatment  Patient Details  Name: Stephen Camacho MRN: [id number] Date of Birth: May 13, 1946 Referring Provider:  No ref. provider found  Encounter Date: 01/09/2015      PT End of Session - 01/09/15 1015    Visit Number 5   Number of Visits 17   Date for PT Re-Evaluation 02/14/15   PT Start Time 0930   PT Stop Time 1015   PT Time Calculation (min) 45 min   Equipment Utilized During Treatment Gait belt   Activity Tolerance Patient tolerated treatment well   Behavior During Therapy WFL for tasks assessed/performed      Past Medical History  Diagnosis Date  . Hyperlipidemia   . Hypertension   . Diabetes mellitus without complication   . CAD (coronary artery disease)   . PVD (peripheral vascular disease)   . Seizures   . Gangrene of foot   . Wheezing     Past Surgical History  Procedure Laterality Date  . Right aka  07-10-2014  . Left femoral popliteal bypass    . Right lung lobeectomy    . Coronary artery bypass graft      There were no vitals filed for this visit.  Visit Diagnosis:  Abnormality of gait  Impaired functional mobility and activity tolerance  Balance problems  Status post above knee amputation of right lower extremity      Subjective Assessment - 01/09/15 0946    Symptoms (p) no falls. wears prosthesis from after breakfast to just before evening bath without issues.   Currently in Pain? (p) No/denies                       [facility] Adult PT Treatment/Exercise - 01/09/15 1015    Transfers   Sit to Stand 5: Supervision;With upper extremity assist;With armrests;From chair/3-in-1   Sit to Stand Details (indicate cue type and reason) demo / verbal cues on technique to engage knee   Stand to Sit 5: Supervision;With upper extremity assist;With armrests;To  chair/3-in-1   Stand to Sit Details cues on knee control   Ambulation/Gait   Ambulation/Gait Yes   Ambulation/Gait Assistance 4: Min guard;5: Supervision   Ambulation/Gait Assistance Details demo / verbal cues on step length, sequence RW after each limb step   Ambulation Distance (Feet) 200 Feet  2X   Assistive device Rolling walker;Prosthesis   Gait Pattern Step-through pattern;Decreased stride length;Decreased stance time - right;Decreased step length - left;Decreased trunk rotation;Trunk flexed;Narrow base of support   Ambulation Surface Level;Indoor   Stairs Yes   Stairs Assistance 5: Supervision   Stairs Assistance Details (indicate cue type and reason) verbal cues on technique / sequence   Stair Management Technique Two rails;Step to pattern;Forwards   Number of Stairs 4  3 reps   Ramp 4: Min assist  with walker/prosthesis x 1 rep   Ramp Details (indicate cue type and reason) cues on technique / knee control   Curb 5: Supervision  with walker/prosthesis   Curb Details (indicate cue type and reason) cues on sequence / technique   Posture/Postural Control   Posture Comments standing with equal wt / mirror for feedback   Balance   Balance Assessed Yes   Dynamic Standing Balance   Dynamic Standing - Balance Support During functional activity  rolling walker suppport   Dynamic Standing - Level of Assistance 5:  Stand by assistance  occassional touch on RW or min guard from PT   Dynamic Standing - Balance Activities Reaching for objects;Head turns;Head nods;Eyes open  donned jacket in standing   Knee/Hip Exercises: Aerobic   Stationary Bike --   Prosthetics   Current prosthetic wear tolerance (days/week)  7 days   Current prosthetic wear tolerance (#hours/day)  increase to donne upon arising, all awake hrs   Residual limb condition  intact per pt report   Education Provided Proper wear schedule/adjustment  PT reviewed proper donning rotation & tightening strap   Person(s)  Educated Patient;Other (comment)  brother   Education Method Explanation;Demonstration   Education Method Verbalized understanding;Returned demonstration;Verbal cues required;Needs further instruction   Donning Prosthesis Supervision   Doffing Prosthesis Modified independent (device/increased time)                PT Education - 01/09/15 1015    Education provided Yes   Education Details Donning, tighten strap fully, gait with step thru pattern   Person(s) Educated Patient;Other (comment)  brother   Methods Explanation;Demonstration   Comprehension Verbalized understanding;Returned demonstration;Need further instruction          PT Short Term Goals - 12/18/14 1115    PT SHORT TERM GOAL #1   Title donnes prosthesis correctly & independently (Target Date: 01/17/15)   Time 4   Period Weeks   Status New   PT SHORT TERM GOAL #2   Title wears prosthesis >8 hours /day without change in skin integrity or tenderness. (Target Date: 01/17/15)   Time 4   Period Weeks   Status New   PT SHORT TERM GOAL #3   Title Patient ambulates 200' with RW & prosthesis modified independent. (Target Date: 01/17/15)   Time 4   Period Weeks   Status New   PT SHORT TERM GOAL #4   Title Patient negotiates ramp, curb with rolling walker & stairs with 2 rails with prothesis with superivsion. (Target Date: 01/17/15)   Time 4   Period Weeks   Status New   PT SHORT TERM GOAL #5   Title reaches 5" without UE support with superivision. (Target Date: 01/17/15)   Time 4   Period Weeks   Status New           PT Long Term Goals - 12/18/14 1115    PT LONG TERM GOAL #1   Title Patient demonstrates / verbalizes proper prosthetic care. (Target Date: 02/14/15)   Time 8   Period Weeks   Status New   PT LONG TERM GOAL #2   Title Patient tolerates wear >90% of awake hours without change in skin integrity or tenderness. (Target Date: 02/14/15)   Time 8   Period Weeks   Status New   PT LONG TERM GOAL #3    Title Patient ambulates 400' with LRAD and prosthesis modified indpendently. (Target Date: 02/14/15)   Time 8   Period Weeks   Status New   PT LONG TERM GOAL #4   Title Patient negotiates ramp, curb and stairs with LRAD & prosthesis modified independent. (Target Date: 02/14/15)   Time 8   Period Weeks   Status New   PT LONG TERM GOAL #5   Title Berg Balance >/= 20/56 (Target Date: 02/14/15)   Time 8   Period Weeks   Status New               Plan - 01/09/15 1015    Clinical Impression Statement Patient improved  gait with changing sequence of moving RW after each foot to equalize step length. But needs more training as he had to be instructed each time stopped to perform different activity like balance.   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   PT Home Exercise Plan midline at sink   Consulted and Agree with Plan of Care Patient;Family member/caregiver   Family Member Consulted brother        Problem List Patient Active Problem List   Diagnosis Date Noted  . 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 Gangrene 07/22/2014  . PVD (peripheral vascular disease) 07/21/2014  . PAD  (peripheral artery disease) 07/21/2014  . S/P AKA (above knee amputation) 07/21/2014  . CAD (coronary artery disease) 07/21/2014  . Dyslipidemia 07/21/2014  . Essential hypertension, benign 07/21/2014    Jamey Reas PT, DPT 01/09/2015, 9:27 PM  [facility] [address] [place], [place], [address] Phone: [phone]   Fax:  [phone]

## 2015-01-14 ENCOUNTER — Encounter: Payer: Self-pay | Admitting: Physical Therapy

## 2015-01-14 ENCOUNTER — Ambulatory Visit: Payer: PPO | Admitting: Physical Therapy

## 2015-01-14 DIAGNOSIS — R2689 Other abnormalities of gait and mobility: Secondary | ICD-10-CM

## 2015-01-14 DIAGNOSIS — Z4781 Encounter for orthopedic aftercare following surgical amputation: Secondary | ICD-10-CM | POA: Diagnosis not present

## 2015-01-14 DIAGNOSIS — R269 Unspecified abnormalities of gait and mobility: Secondary | ICD-10-CM

## 2015-01-14 DIAGNOSIS — Z7409 Other reduced mobility: Secondary | ICD-10-CM

## 2015-01-14 NOTE — Therapy (Signed)
[facility] [address] [place], [place], [address] Phone: [phone]   Fax:  [phone]  Physical Therapy Treatment  Patient Details  Name: Stephen Camacho MRN: [id number] Date of Birth: 1946-04-07 Referring Provider:  No ref. provider found  Encounter Date: 01/14/2015      PT End of Session - 01/14/15 0940    Visit Number 6   Number of Visits 17   Date for PT Re-Evaluation 02/14/15   PT Start Time 0931   PT Stop Time 1015   PT Time Calculation (min) 44 min   Equipment Utilized During Treatment Gait belt   Activity Tolerance Patient tolerated treatment well   Behavior During Therapy WFL for tasks assessed/performed      Past Medical History  Diagnosis Date  . Hyperlipidemia   . Hypertension   . Diabetes mellitus without complication   . CAD (coronary artery disease)   . PVD (peripheral vascular disease)   . Seizures   . Gangrene of foot   . Wheezing     Past Surgical History  Procedure Laterality Date  . Right aka  07-10-2014  . Left femoral popliteal bypass    . Right lung lobeectomy    . Coronary artery bypass graft      There were no vitals filed for this visit.  Visit Diagnosis:  Abnormality of gait  Balance problems  Impaired functional mobility and activity tolerance      Subjective Assessment - 01/14/15 0940    Symptoms No falls or pain to report.    Currently in Pain? No/denies           [facility] Adult PT Treatment/Exercise - 01/14/15 0941    Transfers   Sit to Stand 5: Supervision;With upper extremity assist;With armrests;From chair/3-in-1   Sit to Stand Details (indicate cue type and reason) cues on use of prosthetic knee   Stand to Sit 5: Supervision;With upper extremity assist;With armrests;To chair/3-in-1   Stand to Sit Details cues on use of prosthetic knee   Ambulation/Gait   Ambulation/Gait Yes   Ambulation/Gait Assistance 5: Supervision;4: Min guard   Ambulation/Gait Assistance Details cues to look up/on posture needed; no knee instability/buckling noted. cues to increase left step length and for walker movement with each step                                  Ambulation Distance (Feet) 215 Feet  x1, 220 plus ramp/curb/stairs x 1   Assistive device Rolling walker;Prosthesis   Gait Pattern Step-through pattern;Decreased stride length;Decreased stance time - right;Decreased step length - left;Decreased trunk rotation;Trunk flexed;Narrow base of support   Ambulation Surface Level;Indoor   Stairs Yes   Stairs Assistance 5: Supervision   Stairs Assistance Details (indicate cue type and reason) cues on posture and to advance hands to better assist with stair management   Stair Management Technique Two rails;Step to pattern;Forwards   Number of Stairs 4   Ramp 4: Min assist  with walker/prosthesis   Ramp Details (indicate cue type and reason) cues on sequence, knee stability and walker position with ramp management. assist needed for balance and walker management                   Curb 5: Supervision  with walker/prosthesis   Curb Details (indicate cue type and reason) reminder cues on correct sequence and foot placement with curb management   Knee/Hip Exercises:  Aerobic   Stationary Bike Scifit x 4 extremities level 2.5 x 8 minutes with goal rpm >/= 60 for strengthening and activity tolerance   Prosthetics   Prosthetic Care Comments  Pt able to stand with walker support and reach 4-5 inches forward and laterally with good knee control   Current prosthetic wear tolerance (days/week)  7 days   Current prosthetic wear tolerance (#hours/day)  all awake hours.  2-3 times a day   Residual limb condition  intact per pt report   Education Provided Proper wear schedule/adjustment;Correct ply sock adjustment   Person(s) Educated Patient   Education Method Explanation   Education Method Verbalized understanding   Donning Prosthesis Supervision    Doffing Prosthesis Modified independent (device/increased time)           PT Short Term Goals - 01/14/15 1011    PT SHORT TERM GOAL #1   Title donnes prosthesis correctly & independently (Target Date: 01/17/15)   Baseline on 3/22   Status Achieved   PT SHORT TERM GOAL #2   Title wears prosthesis >8 hours /day without change in skin integrity or tenderness. (Target Date: 01/17/15)   Baseline on 3/22   Status Achieved   PT SHORT TERM GOAL #3   Title Patient ambulates 200' with RW & prosthesis modified independent. (Target Date: 01/17/15)   Period Weeks   Status Partially Met   PT SHORT TERM GOAL #4   Title Patient negotiates ramp, curb with rolling walker & stairs with 2 rails with prothesis with superivsion. (Target Date: 01/17/15)   Status New   PT SHORT TERM GOAL #5   Title reaches 5" without UE support with superivision. (Target Date: 01/17/15)   Baseline on 3/22   Period Weeks   Status Achieved           PT Long Term Goals - 12/18/14 1115    PT LONG TERM GOAL #1   Title Patient demonstrates / verbalizes proper prosthetic care. (Target Date: 02/14/15)   Time 8   Period Weeks   Status New   PT LONG TERM GOAL #2   Title Patient tolerates wear >90% of awake hours without change in skin integrity or tenderness. (Target Date: 02/14/15)   Time 8   Period Weeks   Status New   PT LONG TERM GOAL #3   Title Patient ambulates 400' with LRAD and prosthesis modified indpendently. (Target Date: 02/14/15)   Time 8   Period Weeks   Status New   PT LONG TERM GOAL #4   Title Patient negotiates ramp, curb and stairs with LRAD & prosthesis modified independent. (Target Date: 02/14/15)   Time 8   Period Weeks   Status New   PT LONG TERM GOAL #5   Title Berg Balance >/= 20/56 (Target Date: 02/14/15)   Time 8   Period Weeks   Status New           Plan - 01/14/15 0940    Clinical Impression Statement Pt met STG's 1 and 2, progressing toward others with them partially met.   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   PT Home Exercise Plan midline at sink   Consulted and Agree with Plan of Care Patient;Family member/caregiver   Family Member Consulted brother      Problem List Patient Active Problem List   Diagnosis Date Noted  . 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 Gangrene 07/22/2014  . PVD (peripheral vascular disease) 07/21/2014  . PAD (peripheral artery disease) 07/21/2014  . S/P AKA (above knee amputation) 07/21/2014  . CAD (coronary artery disease) 07/21/2014  . Dyslipidemia 07/21/2014  . Essential hypertension, benign 07/21/2014    Willow Ora 01/14/2015, 10:12 AM  Willow Ora, PTA, [facility] [address] [place], [place] [address] [phone] 01/14/2015, 10:13 AM

## 2015-01-16 ENCOUNTER — Encounter: Payer: Self-pay | Admitting: Physical Therapy

## 2015-01-16 ENCOUNTER — Ambulatory Visit: Payer: PPO | Admitting: Physical Therapy

## 2015-01-16 DIAGNOSIS — Z4781 Encounter for orthopedic aftercare following surgical amputation: Secondary | ICD-10-CM | POA: Diagnosis not present

## 2015-01-16 DIAGNOSIS — R269 Unspecified abnormalities of gait and mobility: Secondary | ICD-10-CM

## 2015-01-17 NOTE — Therapy (Signed)
[facility] [address] [place], [place], [address] Phone: [phone]   Fax:  [phone]  Physical Therapy Treatment  Patient Details  Name: Stephen Camacho MRN: [id number] Date of Birth: May 29, 1946 Referring Provider:  [name], MD  Encounter Date: 01/16/2015     01/16/15 1022  PT Visits / Re-Eval  Visit Number 7  Number of Visits 17  Date for PT Re-Evaluation 02/14/15  PT Time Calculation  PT Start Time 1015  PT Stop Time 1058  PT Time Calculation (min) 43 min  PT - End of Session  Equipment Utilized During Treatment Gait belt  Activity Tolerance Patient tolerated treatment well  Behavior During Therapy [facility] for tasks assessed/performed    Past Medical History  Diagnosis Date  . Hyperlipidemia   . Hypertension   . Diabetes mellitus without complication   . CAD (coronary artery disease)   . PVD (peripheral vascular disease)   . Seizures   . Gangrene of foot   . Wheezing     Past Surgical History  Procedure Laterality Date  . Right aka  07-10-2014  . Left femoral popliteal bypass    . Right lung lobeectomy    . Coronary artery bypass graft      There were no vitals filed for this visit.  Visit Diagnosis:  Abnormality of gait      Subjective Assessment - 01/16/15 2114    Symptoms No falls or pain to report.            [facility] Adult PT Treatment/Exercise - 01/17/15 2110    Transfers   Sit to Stand 5: Supervision;With upper extremity assist;With armrests;From chair/3-in-1   Sit to Stand Details (indicate cue type and reason) cues on use of prosthetic knee   Stand to Sit 5: Supervision;With upper extremity assist;With armrests;To chair/3-in-1   Stand to Sit Details cues on prosthetic knee   Ambulation/Gait   Ambulation/Gait Yes   Ambulation/Gait Assistance 5: Supervision;4: Min guard   Ambulation/Gait Assistance Details cues on posture, increased step length with left leg and for  increased hip/knee flexion with prosthesis with swing phase and decreased hip hike.                             Ambulation Distance (Feet) 220 Feet  120 x 2 reps   Assistive device Rolling walker;Prosthesis   Gait Pattern Step-to pattern;Step-through pattern;Decreased stride length;Decreased step length - left;Decreased step length - right;Trunk flexed   Ambulation Surface Level;Indoor   Ramp 4: Min assist  walker and prosthesis   Ramp Details (indicate cue type and reason) cues on walker position and on sequence/knee stability   Curb 5: Supervision   Curb Details (indicate cue type and reason) demo'd safe technique with descending curb,  needed cues on walker management/safety with ascending curb                   Prosthetics   Current prosthetic wear tolerance (days/week)  7 days   Current prosthetic wear tolerance (#hours/day)  all awake hours.   Residual limb condition  intact per pt report   Education Provided Proper wear schedule/adjustment;Correct ply sock adjustment   Person(s) Educated Patient   Education Method Explanation;Verbal cues   Education Method Verbalized understanding;Verbal cues required   Donning Prosthesis Modified independent (device/increased time)   Doffing Prosthesis Modified independent (device/increased time)           PT Short  Term Goals - 01/14/15 1011    PT SHORT TERM GOAL #1   Title donnes prosthesis correctly & independently (Target Date: 01/17/15)   Baseline on 3/22   Status Achieved   PT SHORT TERM GOAL #2   Title wears prosthesis >8 hours /day without change in skin integrity or tenderness. (Target Date: 01/17/15)   Baseline on 3/22   Status Achieved   PT SHORT TERM GOAL #3   Title Patient ambulates 200' with RW & prosthesis modified independent. (Target Date: 01/17/15)   Period Weeks   Status Partially Met   PT SHORT TERM GOAL #4   Title Patient negotiates ramp, curb with rolling walker & stairs with 2 rails with prothesis with superivsion.  (Target Date: 01/17/15)   Status New   PT SHORT TERM GOAL #5   Title reaches 5" without UE support with superivision. (Target Date: 01/17/15)   Baseline on 3/22   Period Weeks   Status Achieved           PT Long Term Goals - 12/18/14 1115    PT LONG TERM GOAL #1   Title Patient demonstrates / verbalizes proper prosthetic care. (Target Date: 02/14/15)   Time 8   Period Weeks   Status New   PT LONG TERM GOAL #2   Title Patient tolerates wear >90% of awake hours without change in skin integrity or tenderness. (Target Date: 02/14/15)   Time 8   Period Weeks   Status New   PT LONG TERM GOAL #3   Title Patient ambulates 400' with LRAD and prosthesis modified indpendently. (Target Date: 02/14/15)   Time 8   Period Weeks   Status New   PT LONG TERM GOAL #4   Title Patient negotiates ramp, curb and stairs with LRAD & prosthesis modified independent. (Target Date: 02/14/15)   Time 8   Period Weeks   Status New   PT LONG TERM GOAL #5   Title Berg Balance >/= 20/56 (Target Date: 02/14/15)   Time 8   Period Weeks   Status New        01/16/15 1023  Plan  Clinical Impression Statement Pt continues to progress towards goals. Focused on knee use with gait (flexion with swing phase and lock/stability with stance) today.   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 Gangrene 07/22/2014  . PVD (peripheral vascular disease) 07/21/2014  . PAD (peripheral artery disease) 07/21/2014  . S/P AKA (above knee amputation) 07/21/2014  . CAD (coronary artery disease) 07/21/2014  . Dyslipidemia 07/21/2014  . Essential hypertension, benign 07/21/2014    Willow Ora 01/17/2015, 9:14 PM  Willow Ora, PTA, [facility] [address], [place], [place] [address] [phone] 01/17/2015, 9:14 PM

## 2015-01-21 ENCOUNTER — Ambulatory Visit: Payer: PPO | Admitting: Physical Therapy

## 2015-01-23 ENCOUNTER — Ambulatory Visit: Payer: PPO | Admitting: Physical Therapy

## 2015-01-28 ENCOUNTER — Ambulatory Visit: Payer: PPO | Attending: Family Medicine | Admitting: Physical Therapy

## 2015-01-28 DIAGNOSIS — Z89611 Acquired absence of right leg above knee: Secondary | ICD-10-CM | POA: Insufficient documentation

## 2015-01-28 DIAGNOSIS — R269 Unspecified abnormalities of gait and mobility: Secondary | ICD-10-CM | POA: Insufficient documentation

## 2015-01-28 DIAGNOSIS — Z4781 Encounter for orthopedic aftercare following surgical amputation: Secondary | ICD-10-CM | POA: Insufficient documentation

## 2015-01-30 ENCOUNTER — Ambulatory Visit: Payer: PPO | Admitting: Physical Therapy

## 2015-01-30 NOTE — Patient Outreach (Signed)
[facility]) Care Management  01/30/2015  Stephen Camacho 09/21/1946 [id number]   Received notification from [name], RN to close case due to unable to contact patient for [facility].  Case closed at this time.  [name]. [name] CM Assistant Phone: [phone] Fax: [phone]

## 2015-02-04 ENCOUNTER — Telehealth: Payer: Self-pay | Admitting: Physical Therapy

## 2015-02-04 ENCOUNTER — Ambulatory Visit: Payer: PPO | Admitting: Physical Therapy

## 2015-02-04 NOTE — Telephone Encounter (Signed)
Left Message - called and left message to inquire how pt is doing as he has missed several PT visits without calling to cancell. Have been unable to reach pt via phone or leave a message at this number due to voice mail full. Requested call back.   By Drema Balzarine, PTA   Willow Ora, PTA, [facility] [address] [place], [place] [address] [phone] 02/04/2015, 10:39 AM

## 2015-02-06 ENCOUNTER — Ambulatory Visit: Payer: PPO | Admitting: Physical Therapy

## 2015-02-11 ENCOUNTER — Ambulatory Visit: Payer: PPO | Admitting: Physical Therapy

## 2015-02-13 ENCOUNTER — Ambulatory Visit: Payer: PPO | Admitting: Physical Therapy

## 2015-02-15 NOTE — Discharge Summary (Signed)
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   SECONDARY DIAGNOSES: Hypertension, diabetes.   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   HISTORY:  Mr. Bullinger is a 69 year old gentleman who has undergone extensive vascular reconstructions of his lower extremities in the past. Approximately 2 months ago, he presented with thrombosis of his arterial bypass graft, and attempts at thrombolysis and salvage were unsuccessful. Since that time, he has been in significant rest pain and has developed gangrene of multiple toes. Further surgery and interventions are not viable options at this time, and he has decided to move forward with above-knee amputation.   HOSPITAL COURSE:  On the day of admission, he underwent above-knee amputation of the right leg. Postoperatively, he has had a fairly normal, uneventful course except that it has been marked with moderate confusion. On evaluation by physical therapy, it was felt he would benefit and need inpatient rehab, and he is therefore undergoing transfer on postoperative day #6 for inpatient services. He is transferred in stable condition. His medications are per the reconciliation on his discharge. His pain medication is Percocet, for which a prescription has been written and placed in the chart. Diet is carbohydrate-controlled. Activities are as tolerated. Therapy to continue.    ____________________________ [name], MD ggs:nb D: 07/15/2014 18:14:34 ET T: 07/15/2014 23:02:08 ET JOB#: [id number]  cc: [name], MD, <Dictator> [name] MD ELECTRONICALLY SIGNED 08/12/2014 20:43

## 2015-02-15 NOTE — Discharge Summary (Signed)
PATIENT NAME:  Stephen Camacho, Stephen Camacho MR#:  [id number] DATE OF BIRTH:  01/27/1946  DATE OF ADMISSION:  01/23/2014 DATE OF DISCHARGE:  01/25/2014  ADMITTING DIAGNOSIS:  Ischemic left lower extremity with rest pain and occlusion of his left leg bypass.   DISCHARGE DIAGNOSIS:  Ischemic left lower extremity with rest pain and occlusion of his left leg bypass.   PROCEDURES PERFORMED IN HOSPITAL:  Percutaneous revascularization of the left lower extremity with continuous thrombolysis, infusion of tPA. For full details of those operative summaries, please see the dictated operative notes from April 1st and  April 2nd.   BRIEF HISTORY: A 69 year old male known to our service for severe peripheral vascular disease. He had previously undergone leg bypass and presented to our office with an occlusion of his bypass. He is brought in for an attempt at salvage to save his leg and salvage the bypass.  Risks and benefits were discussed. Informed consent was obtained.   HOSPITAL COURSE:  The patient was admitted for an ischemic leg after being seen in the office as described above. He was taken to angio suite that day where an angiogram was performed and intervention was started and infusion for thrombolysis catheter was placed. He was brought back the next day where his bypass was found to be patent with preserved flow distally. His leg was warm. The tPA infusion was stopped, and he was kept on anticoagulants. He had some right groin oozing that was managed with pressure.  On hospital day 2, lidocaine with epi injection and skin closure was performed, which resulted in cessation of bleeding. He was discharged home on Eliquis for anticoagulation and will stop taking his Coumadin. He will resume all of his other previous home medications, which include Novolin insulin, acetaminophen for arthritis, lisinopril 5 mg, atorvastatin 40 mg, metformin 500 mg, metoprolol 25 mg b.i.d. and Colace 100 mg p.o. daily. He will return to our  office in 2 to 3 weeks with noninvasive studies. His diet is regular and his activities as tolerated.     ____________________________ [name], MD jsd:dmm D: 02/07/2014 15:29:00 ET T: 02/07/2014 22:37:26 ET JOB#: [id number]  cc: [name], MD, <Dictator> [name] MD ELECTRONICALLY SIGNED 02/18/2014 9:34

## 2015-02-15 NOTE — Op Note (Signed)
PATIENT NAME:  Stephen Camacho, Stephen Camacho MR#:  [id number] DATE OF BIRTH:  [birth year]  DATE OF PROCEDURE:  06/13/2014  PREOPERATIVE DIAGNOSES:  1.  Ischemic right lower extremity with rest pain.  2.  Status post intervention for his ischemia with placement of the catheter for continued thrombolysis with poor venous access.  3.  Diabetes.  4.  Hypertension.   POSTOPERATIVE DIAGNOSES:  1.  Ischemic right lower extremity with rest pain.  2.  Status post intervention for his ischemia with placement of the catheter for continued thrombolysis with poor venous access.  3.  Diabetes.  4.  Hypertension.   PROCEDURES: 1.  Ultrasound guidance for vascular access to right jugular vein.  2.  Fluoroscopic guidance placement of catheter.  3.  Placement of a non-tunneled central triple-lumen catheter, right jugular vein.   SURGEON: [name], M.D.   ANESTHESIA: Local with moderate conscious sedation.   ESTIMATED BLOOD LOSS: Minimal.  FLUOROSCOPY TIME: Less than 1 minute.   CONTRAST USED: None.   INDICATION FOR PROCEDURE: A 69 year old gentleman who had just completed a major intervention in the right lower extremity. He requires continued thrombolysis and will have to stay on this overnight due to the high risk nature of this procedure.  A central line is placed for durable venous access.   DESCRIPTION OF PROCEDURE: The patient remained on the vascular table. His right neck and chest were sterilely prepped and draped and a sterile surgical field was created. Ultrasound was used to visualize a patent right jugular vein that was then accessed under direct ultrasound guidance without difficulty with Seldinger needle. A J-wire was then placed. After skin nick and dilatation, a triple lumen catheter was placed over the wire and the wire was removed. The catheter tip was parked under fluoroscopic guidance at the cavoatrial junction. This was about 18 cm to the skin. It was then secured with 3 silk sutures at 18  cm, and a sterile dressing was placed. The patient tolerated the procedure well and was taken to the recovery room in stable condition that.    ____________________________ [name], MD jsd:TT D: 06/13/2014 10:04:48 ET T: 06/13/2014 16:00:57 ET JOB#: [id number]  cc: [name], MD, <Dictator> [name] MD ELECTRONICALLY SIGNED 06/19/2014 10:32

## 2015-02-15 NOTE — Op Note (Signed)
PATIENT NAME:  Stephen Camacho, FRISBIE MR#:  [id number] DATE OF BIRTH:  1945/12/03  DATE OF PROCEDURE:  06/13/2014  PREOPERATIVE DIAGNOSES: 1.  Peripheral arterial disease with ischemic rest pain, right lower extremity with acute ischemia from occluded femoral to distal bypass.  2.  Hypertension.  3.  Diabetes.  4.  Coronary disease.   POSTOPERATIVE DIAGNOSES:   1.  Peripheral arterial disease with ischemic rest pain, right lower extremity with acute ischemia from occluded femoral to distal bypass.  2.  Hypertension.  3.  Diabetes.  4.  Coronary disease.   PROCEDURES:  1.  Ultrasound guidance for vascular access, left femoral artery.  2.  Catheter placement to right posterior tibial artery from left femoral approach.  3.  Aortogram, and selective right lower extremity angiogram.  4.  Catheter directed thrombolysis with 8 mg of tissue plasminogen activator (t- PA) delivered with the AngioJet catheter throughout the right femoral to posterior tibial bypass, and in the right posterior tibial artery.  5.  Mechanical rheolytic thrombectomy with the AngioJet catheter in the femoral to distal bypass as well as the native posterior tibial artery.  6.  Percutaneous transluminal angioplasty of posterior tibial artery, and distal bypass with 3 mm diameter angioplasty balloon.  7.  Placement of infusion catheter for overnight thrombolysis to right femoral to distal bypass and posterior tibial artery.   SURGEON:  [name], MD   ANESTHESIA:  Local with moderate conscious sedation.   ESTIMATED BLOOD LOSS:  Is 25 mL.   INDICATION FOR PROCEDURE:  This is a 69 year old gentleman with severe peripheral vascular disease. He has had bilateral lower extremity bypasses. He has had multiple previous percutaneous interventions as well. He presented to our office this week with acute rest pain in the right lower extremity, and he was found to have an occluded right distal bypass. He is brought in for an attempt at  salvage of this bypass. Risks, benefits were discussed. Informed consent was obtained.   DESCRIPTION OF PROCEDURE: The patient is brought to the vascular suite. Groins were shaved and prepped, and a sterile surgical field was created. The left femoral head was localized with fluoroscopy and left femoral artery was visualized with ultrasound, and found widely patent. It was then accessed under direct ultrasound guidance without difficulty with a Seldinger needle. A J-wire, and 5 French sheath were placed. Pigtail catheter was placed in the aorta at the L1-L2 level, and AP aortogram was performed. This demonstrated what appeared to be at least moderate stenosis of the left renal artery. The right renal artery was not as well seen, this could have been from the catheter being slightly low, and there was nephrogram that was seen late.   The Pigtail catheter was then used to cross the aortic bifurcation, and advance to the right femoral head. Selective right lower extremity angiogram was then performed. This demonstrated occlusion of the bypass with minimal flow seen distally. Initially, I then placed a 6 [place] Ansell sheath over a Terumo advantage wire and a 65 cm Kumpe catheter, I was able to gain access to the bypass and advanced down throughout the bypass and get into the posterior tibial artery, and confirmed intraluminal flow with catheter in the posterior tibial artery.   At this point I replaced the wire and instilled 8 mg of t-PA with the AngioJet catheter throughout the bypass, and into the posterior tibial artery distally. This was allowed to dwell for about 20 minutes. I then came back. The AngioJet catheter  was then used to perform mechanical rheolytic thrombectomy. This was performed throughout the entirety of the femoral to distal bypass as well as in the native posterior tibial artery where thrombus was seen on a previous angiogram at the distal anastomosis for a catheter exchange. This was  separate, and occluded beyond the bypass itself.   Following this, there was still diminished flow within the bypass. The distal anastomosis had a significant amount of thrombus, and possibly stenosis located in this area. I performed percutaneous transluminal angioplasty with a 3 mm diameter angioplasty balloon into the posterior tibial artery back to the distal bypass anastomosis, but thrombus remained.   At this point, I elected to place an infusion catheter for overnight thrombolysis, and a 135 total length, 50 mm working length catheter was placed through the sheath. Its distal tip was parked just into the posterior tibial artery just beyond the bypass. The proximal portion was in the proximal bypass graft but not all the way to the origin as this was not quite long enough but was the longest catheter was had. It was secured at the skin with 2 silk sutures. I then placed a central line which will be dictated separately.    ____________________________ [name], MD jsd:nt D: 06/13/2014 10:01:23 ET T: 06/13/2014 16:03:04 ET JOB#: [id number]  cc: [name], MD, <Dictator> [name] MD ELECTRONICALLY SIGNED 06/19/2014 10:33

## 2015-02-15 NOTE — Op Note (Signed)
PATIENT NAME:  Stephen Camacho, Stephen Camacho MR#:  [id number] DATE OF BIRTH:  1946/08/25  DATE OF PROCEDURE:  01/24/2014  PREOPERATIVE DIAGNOSES:  1.  Ischemic left lower extremity with rest pain and occluded femoral to popliteal bypass.  2.  Status post placement of thrombolysis catheters.  3.  Hypertension.   POSTOPERATIVE DIAGNOSES:  1.  Ischemic left lower extremity with rest pain and occluded femoral to popliteal bypass.  2.  Status post placement of thrombolysis catheters.  3.  Hypertension.   PROCEDURE: 1.  Angiogram through existing catheter, left lower extremity.  2.  StarClose closure device, right femoral artery.   SURGEON: Leotis Pain, M.D.   ANESTHESIA: Local with moderate conscious sedation.   ESTIMATED BLOOD LOSS: Minimal.   FLUOROSCOPY TIME: Approximately 2 minutes.   CONTRAST USED: 30 mL.   INDICATION FOR PROCEDURE: This is a 69 year old gentleman, who was brought in yesterday for an ischemic leg. We were able to get lysis catheter in and run tPA overnight. He is brought back for second look angiography.   DESCRIPTION OF PROCEDURE: The patient is brought to the operative suite and after an adequate level of intravenous sedation was obtained, the existing catheter was rewired and a Magic torque wire was parked into the distal posterior tibial artery and the lysis catheter was removed. Imaging was performed through the 6-French Ansell sheath. This demonstrated a patent common femoral artery, profunda femoris artery, femoral to popliteal bypass with continuous runoff through the posterior tibial artery to the foot. The thrombus had been resolved. At this point, I elected to terminate the procedure. The sheath was pulled back to the ipsilateral external iliac artery and oblique arteriogram was performed. StarClose closure device was deployed in the usual fashion with excellent hemostatic result. The patient tolerated the procedure well and was taken to the recovery room in stable  condition.    ____________________________ [name], MD jsd:aw D: 01/24/2014 08:32:00 ET T: 01/24/2014 08:43:41 ET JOB#: [id number]  cc: [name], MD, <Dictator> [name] MD ELECTRONICALLY SIGNED 01/28/2014 9:54

## 2015-02-15 NOTE — Discharge Summary (Signed)
PATIENT NAME:  Stephen Camacho, Stephen Camacho MR#:  [id number] DATE OF BIRTH:  01/07/1946  DATE OF ADMISSION:  06/11/2014 DATE OF DISCHARGE:  06/16/2014  DISCHARGE DIAGNOSIS: Ischemia of right lower extremity secondary to thrombosis of femoral-to-distal bypass graft.  SECONDARY DIAGNOSES: 1. Atherosclerotic occlusive disease, bilateral lower extremities, with claudication.  2. Hypertension.  3. Diabetes.   PROCEDURES PERFORMED:  1. Angiography, right lower extremity with initiation of thrombolysis 06/13/2014.  2. Continuation of thrombolysis 06/14/2014.  3. Angioplasty with intervention and a cessation of thrombolysis 06/14/2014.   CONSULTATIONS: None.   HISTORY: Mr. Zajicek is a 69 year old gentleman who presented to the office with increased pain in his right lower extremity and duplex ultrasound as well as physical examination demonstrated occlusion of his graft. He recently was occluded in April and underwent thrombolysis for limb salvage. He was subsequently readmitted to the hospital for pain control and subsequent intervention.   On hospital day number 2, he underwent successful initiation of thrombolysis. Twenty-four hours later he was returned to the angiography suite, was found to have significant distal disease, which was not clearing with his tPA. A t-PA infusion was increased and he was reassessed in 6 hours which demonstrated patency of the distal system and patency of his graft. His t-PA was then stopped and IIb/IIIa infusion was started for 24 hours. On the day before admission, he was noted to have a cool foot but he was pain-free. He was subsequently transferred to the floor. He ambulated well yesterday and today states he is not having any pain in his foot, although his toes do feel somewhat numb.   On hospital day number 5, he is felt fit for discharge. He is discharged to home. He is to continue a diabetic diet. His medications are as ordered with the addition of Pletal and aspirin to  his Xarelto. He will follow up with me in 1 week for followup ultrasound. He is to continue light activities.   CONDITION ON DISCHARGE: Improved.   ____________________________ [name], MD ggs:lt D: 06/16/2014 14:56:44 ET T: 06/16/2014 22:53:48 ET JOB#: [id number]  cc: [name], MD, <Dictator> [name] MD ELECTRONICALLY SIGNED 07/02/2014 22:37

## 2015-02-15 NOTE — Op Note (Signed)
PATIENT NAME:  Stephen Camacho, Stephen Camacho MR#:  [id number] DATE OF BIRTH:  1945/12/22  DATE OF PROCEDURE:  07/10/2014  PREOPERATIVE DIAGNOSIS: Atherosclerotic occlusive disease, bilateral lower extremities, with gangrene of the right forefoot.   POSTOPERATIVE DIAGNOSIS: Atherosclerotic occlusive disease, bilateral lower extremities, with gangrene of the right forefoot.  PROCEDURE PERFORMED: Right above-knee amputation.   SURGEON: [name], MD   ANESTHESIA: General by LMA.   FLUIDS: Per anesthesia record.   ESTIMATED BLOOD LOSS: 300 mL.   SPECIMEN: Distal right lower extremity to pathology, permanent section.   INDICATIONS: [name] is a 69 year old gentleman who has undergone multiple surgeries and interventions for right lower extremity limb salvage. Recently, he experienced third thrombosis of his femoral to tibial bypass graft. Attempts at thrombolysis were unsuccessful. He has developed progressive gangrene with worsening pain and has elected to proceed with amputation. The risks and benefits are reviewed. All questions answered. The patient agrees to proceed.   DESCRIPTION OF PROCEDURE: The patient is taken to the Operating Room and is placed in the supine position. After appropriate general anesthesia is induced and appropriate invasive monitors are placed, he is repositioned supine with his right leg, then prepped and draped in a sterile fashion. Appropriate timeout is called.   Approximately one handbreadth above the patella, the circumference of the thigh is marked. A  fishmouth incision is then traced with a surgical marker and the skin is incised circumferentially. Hemostasis is obtained with Bovie cautery and 3-0 silk ties as needed. Fascia is then involved and the muscle bellies are transected. Neurovascular bundle is identified including the previous Gore-Tex bypass graft and this is ligated with 0 Ethibond for the graft and 0 Vicryl for the native vessels.   Periosteum is  elevated. Gigli saw was then used to transect the femur and the amputation knife is used to transect the posterior muscle bellies. Hemostasis achieved with hemostats and subsequently 3-0 Vicryl is used to control each individual spot. The wound is then irrigated. The skin edges are approximated. The femur is slightly long creating tension, and therefore another  3 to 4 cm of femur is resected with the Gigli saw. The wound is then irrigated again, hemostasis is assured, and the fascia was reapproximated using interrupted 0 Vicryl figure-of-eight sutures. Skin is closed with staples and a VAC is applied. The patient tolerated the procedure well and there were no immediate complications.    ____________________________ [name], MD ggs:lr D: 07/10/2014 11:25:47 ET T: 07/10/2014 12:14:31 ET JOB#: [id number]  cc: [name], MD, <Dictator> [name] MD ELECTRONICALLY SIGNED 08/12/2014 20:43

## 2015-02-15 NOTE — H&P (Signed)
PATIENT NAME:  Stephen Camacho, Stephen Camacho MR#:  [id number] DATE OF BIRTH:  Mar 04, 1946  DATE OF ADMISSION:  01/23/2014  CHIEF COMPLAINT: Left foot pain.   ADMITTING DIAGNOSIS: Ischemic lower extremity, left.  HISTORY OF PRESENT ILLNESS: The patient is a 69 year old male who presented to our office today with severe left foot pain. This began suddenly yesterday. This is extremely painful. He was unable to sleep last night due to the pain. The pain is present all the time. No ulcerations. He took a Percocet last night, which did not help. He denies swelling. He does not smoke. He has a history of left femoral to below knee popliteal bypass in 2013. He was supposed to return in August 2014 for his duplex study, but he missed this appointment. Arterial duplex was done today at the office, which showed greater than 50% stenosis of the left external iliac artery, and occlusion was present distally. There was occlusion noted of the left common femoral, deep femoral, femoral to below knee popliteal bypass graft, superficial femoral, popliteal, tibioperoneal trunk, peroneal and tibial arteries.   PAST MEDICAL HISTORY: Peripheral arterial disease with history of gangrene, hyperlipidemia, hypertension, history of foot ulceration, pain in the limb.   ALLERGIES: No known drug allergies.   FAMILY HISTORY: Hyperlipidemia, hypertension, and peripheral arterial disease.   SOCIAL HISTORY: He denies smoking. Occasional alcohol use in the past.   MEDICATION HISTORY: Our record in the office of his medications includes ibuprofen 600 mg 1 to 2 tabs p.o. every 6 hours as needed, Plavix 75 mg daily, Coumadin 5 mg daily (which I do not think he is taking any longer), Advicor (unknown dose), omeprazole (unknown dose), and Aggrenox (unknown dose).  PAST SURGICAL HISTORY: Coronary bypass graft, left femoral to below knee popliteal bypass in 2013, right femoral to posterior tibial bypass.   REVIEW OF SYSTEMS:  GENERAL: Denies fever,  chills, weight gain or weight loss.  SKIN: Denies rash or ulceration.  HEENT: Denies facial numbness or tingling. Denies ringing in the ears or headache.  NECK: No pain or swelling.  RESPIRATORY: Reports chronic cough with some difficulty breathing. Denies wheezing.  CARDIOVASCULAR: Denies irregular heart beat or palpitations.  GASTROINTESTINAL: Denies diarrhea, constipation, nausea or vomiting. NEUROLOGICAL: Denies tingling, stroke, dizziness, numbness.  PSYCHIATRIC: Reports some memory loss and depression. Denies anxiety. HEMATOLOGIC: Denies anemia or easy bruising.   PHYSICAL EXAMINATION:  VITAL SIGNS: At the office, weight 169 pounds. Height 76 inches reported by the patient. Pulse 70, respirations 16, blood pressure 174/89 in the right arm.  GENERAL: Well developed and well-nourished, does not appear in acute distress.  SKIN: Warm, pink, and intact. No ulcerations noted.  HEAD AND NECK: Normocephalic, atraumatic. Carotid bruit present, which is known. Supple and nontender. Trachea midline.  LUNGS: Breath sounds are clear to auscultation bilaterally. Inspection of the chest reveals no increase in respiratory effort. HEART: Regular rate and rhythm. No JVD.  ABDOMEN: Soft, nontender, nondistended. Normal bowel sounds.  PERIPHERAL VASCULAR: The left foot is cold to the touch. No ulcerations or erythema. Unable to palpate left dorsalis pedis and posterior tibial pulses. Right posterior tibial pulse is 1+. Right foot has amputation sites of toes, which are healed.  NEUROLOGIC: The patient is alert and oriented. MUSCULOSKELETAL: He is using a walker to ambulate.   ASSESSMENT AND PLAN: Peripheral arterial disease with ischemic left leg. As above, ultrasound was done at our office showing occlusion of his left femoral to below knee popliteal bypass and occlusion in all the vessels distally.  This was discussed with Dr. Lucky Cowboy and Dr. Delana Meyer. We will do a direct admit to the hospital and start him on  a heparin drip. He will have an angiogram of the left leg today with thrombolysis. The risks and benefits were discussed with him and a family member, and he wishes to proceed.    ____________________________ Marin Shutter Hurbert Duran, PA-C cnh:jcm D: 01/23/2014 16:25:30 ET T: 01/23/2014 17:07:14 ET JOB#: [id number]  cc: Marin Shutter. Ananya Mccleese, PA-C, <Dictator> Delta PA ELECTRONICALLY SIGNED 01/28/2014 13:24

## 2015-02-15 NOTE — Op Note (Signed)
PATIENT NAME:  Stephen Camacho, Stephen Camacho MR#:  [id number] DATE OF BIRTH:  26-Jul-1946  DATE OF PROCEDURE:  06/14/2014  PREOPERATIVE DIAGNOSES:  1. Peripheral arterial disease with rest pain, right lower extremity with acute on chronic ischemia from occluded femoral to distal bypass.  2. Status post placement of overnight thrombolysis catheter prior to previous day.  3. Hypertension.  4. Diabetes.   POSTOPERATIVE DIAGNOSES: 1. Peripheral arterial disease with rest pain, right lower extremity with acute on chronic ischemia from occluded femoral to distal bypass.  2. Status post placement of overnight thrombolysis catheter prior to previous day.  3. Hypertension.  4. Diabetes.   PROCEDURES:  1. Angiogram through existing catheter, right lower extremity.  2. Mechanical rheolytic thrombectomy of right common femoral artery. 3. Femoral to distal bypass in posterior tibial arteries with the AngioJet OMNI catheter.  4. Percutaneous transluminal angioplasty of proximal bypassed anastomosis and common femoral artery with 6 mm diameter drug-coated and 7 mm diameter high-pressure angioplasty balloon.  5. Percutaneous transluminal angioplasty of distal right bypass anastomosis of the posterior tibial artery with 4 mm diameter drug-coated angioplasty balloon.  6. Placement of the catheter back into femoral to distal bypass for continued thrombolysis.   SURGEON: [name], MD.   ANESTHESIA: Local with moderate conscious sedation.   ESTIMATED BLOOD LOSS: Minimal.   INDICATION FOR PROCEDURE: A 69 year old gentleman who was started yesterday on thrombolysis for an occluded femoral to distal bypass. He returns this morning for repeat angiogram through existing catheter.   DESCRIPTION OF PROCEDURE: The patient's existing thrombolysis catheter was removed and a Magic torque wire was placed. Imaging was performed through the sheath. It was parked in the common femoral artery. This demonstrated no flow within the  bypass graft. No flow within the profunda femoris artery essentially no flow distally through collaterals. At this point, we performed mechanical rheolytic thrombectomy for about 200 mL of effluent return throughout the femoral to distal bypass graft in the common femoral artery and the posterior tibial artery. Still there was no flow. I took a 6 mm diameter drug-coated angioplasty balloon and a 7 mm diameter high pressure angioplasty balloon and inflated the proximal bypass anastomosis. With this, there could be seen some flow within the graft, but there was still thrombus in the common femoral artery in the proximal bypass anastomosis. The flow was very sluggish. The distal anastomosis also had a large area of thrombus. Mechanical rheolytic thrombectomy was performed in this location and a 4 mm diameter drug-coated angioplasty balloon was inflated at the distal bypass anastomosis. However, there was still essentially no flow going through the graft. At this point, I elected to replace the thrombolysis catheter. The proximal endpoint of the catheter was parked in the common femoral artery in hopes of opening up the profunda femoris as well. The 50 mm working catheter only gets the distal portion of the bypass graft and not into the posterior tibial artery. This was secured with 2 silk sutures and the patient was taken to the Recovery Room in stable condition.     ____________________________ [name], MD jsd:ls D: 06/14/2014 13:09:56 ET T: 06/14/2014 13:40:31 ET JOB#: [id number]  cc: [name], MD, <Dictator> [name] MD ELECTRONICALLY SIGNED 06/19/2014 10:33

## 2015-02-15 NOTE — Op Note (Signed)
PATIENT NAME:  Stephen Camacho, Stephen Camacho MR#:  [id number] DATE OF BIRTH:  08-Apr-1946  DATE OF PROCEDURE:  06/18/2014  PREOPERATIVE DIAGNOSES: 1.  Atherosclerotic occlusive disease, bilateral lower extremities, with gangrene of the right foot.  2.  Complication of vascular device with thrombosis of femoral distal bypass graft.  3.  Left pseudoaneurysm.  4.  Diabetes.  5.  Hypertension.  POSTOPERATIVE DIAGNOSES: 1.  Atherosclerotic occlusive disease, bilateral lower extremities, with gangrene of the right foot.  2.  Complication of vascular device with thrombosis of femoral distal bypass graft.  3.  Left pseudoaneurysm.  4.  Diabetes.  5.  Hypertension.  PROCEDURE PERFORMED: 1.  Right lower extremity distal runoff, third-order catheter placement, with introduction catheter into posterior tibial artery.  2.  Additional third-order catheter placement, right lower extremity, with introduction catheter into distal profunda femoris.  3.  Percutaneous transluminal angioplasty of profunda femoris.  4.  Thrombin injection, left pseudoaneurysm.   PROCEDURE PERFORMED BY: [name], MD   SEDATION: Precedex drip plus IV fentanyl. Continuous ECG, pulse oximetry, and cardiopulmonary monitoring is performed throughout the entire procedure by the interventional radiology nurse. Total sedation time was 1 hour 30 minutes.   ACCESS: 1.  A 6-French sheath, antegrade direction, right common femoral artery.  2.  A 5-French microsheath, left pseudoaneurysm.   CONTRAST USED: Isovue 45 mL.   FLUOROSCOPY TIME: 10.9 minutes.   INDICATIONS: Mr. Lizer is a 69 year old gentleman who recently underwent thrombolysis in an attempt to salvage his right lower extremity. He returned to the office yesterday with increased pain in his right foot and was found on physical examination and noninvasive studies to have reoccluded his femoral distal bypass graft.   The risks and benefits for angiography with the hope for  finding a distal target for revision of bypass were reviewed. Potential intervention was discussed. All questions were answered. The patient agrees to proceed.   DESCRIPTION OF PROCEDURE: The patient is taken to the special procedure suite, placed in the supine position. After adequate sedation on Precedex drip has been achieved, his left groin and right groin are prepped and draped in a sterile fashion.   Left groin is addressed initially, as the plan was to go up and over; however, on palpation there is a large pulsatile mass. Duplex ultrasound was then utilized and the mass accessed. Small hand injection of contrast demonstrated pseudoaneurysm. The microsheath was then flushed with saline and left for the end of the case, at which time the pseudoaneurysm will be treated.   The right common femoral is then evaluated with ultrasound. A portion of the femoral artery proximal was selected. Lidocaine 1% was infiltrated in the soft tissues. The femoral artery is echolucent and pulsatile, indicating patency. Image is recorded for the permanent record and a micropuncture needle is inserted, microwire, followed by microsheath. A J-wire is then advanced and initially a 5-French sheath is placed.   Hand injection of contrast is then utilized to demonstrate the femoral anatomy in an RAO projection. Subsequently, a stiff angled Glidewire and then a KMP catheter are negotiated into the profunda femoris. The wire and the catheter are negotiated all the way down to the level of the mid thigh in the very distal profunda, and the Kumpe is exchanged for a straight Royal Flush catheter. Angiography with the power injector is then performed, demonstrating the distal profunda femoris. Unfortunately, there are virtually no collaterals extending below the knee and there is complete nonvisualization of any reconstitution of a tibial  vessel. Given this finding, the wire was reintroduced, and the catheter was exchanged for a Kumpe  catheter. The groin was then imaged in an LAO projection and the cul-de-sac of the AV graft was identified using the Kumpe catheter and the stiff angled Glidewire, the catheter-wire combination, and subsequently the straight catheter and the wire were negotiated into the graft and then down into the posterior tibial. With the catheter in the posterior tibial, hand injection of contrast demonstrated that the posterior tibial at the anastomosis was patent for a short segment but that distally there is complete occlusion of the posterior tibial. There is not filling of the peroneal or the anterior tibial from this level, as well. Essentially there is no distal target.   It should be noted that in the oblique view a string sign was noted at the origin of the profunda femoris and that 3000 units of heparin was then given. The Kumpe and Glidewire were negotiated into the profunda femoris, and then using the Kumpe, the Glidewire was exchanged for a Magic Torque wire. A 6 x 40 Lutonix balloon was then advanced across the origin of the profunda and inflated to 12 atmospheres for 3 full minutes. Followup imaging demonstrates the profunda is now widely patent with much improved flow.   One more oblique view of the groin was obtained and a Mynx device was deployed to close the antegrade puncture site.   The Doppler was then reprepped and draped and the left pseudoaneurysm was imaged, it was found to have color flow and subsequently thrombin was reconstituted onto the field. Several 0.5 mL injections were made under direct ultrasound visualization until there was complete cessation of color flow within the pseudoaneurysm sac. The common femoral artery was then interrogated and noted to have a normal triphasic flow pattern.   Light pressure was held on the pseudoaneurysm as well as on the right groin, and subsequently sterile dressings were applied. The patient tolerated the procedure well and there were no immediate  complications.   INTERPRETATION: Initial views of the right lower extremity demonstrate the common femoral is patent. There is a cul-de-sac to the graft. The previous SFA is noted secondary to the stents, but there is no flow. The profunda femoris is patent, with fairly extensive collaterals to the pelvis. It is in the LAO projection that a string sign is identified at its origin. This is a short segment over approximately 10 mm.   As noted above, with injection in the distal profunda, there is no filling whatsoever of tibial. With injection in the posterior tibial itself, not only is there no forward flow in the posterior tibial with occlusion from the level of the anastomosis to the lateral plantar, there is nonfilling of either of the other 2 tibials. There is no identifiable distal target for revision of his bypass.   Following angioplasty, using a 6 mm balloon with Lutonix, for 3 minutes there is resolution of the profunda femoris lesion.   Unfortunately, given the findings on injection of the profunda femoris, it does not appear that treating his proximal profunda lesion will provide any measurable relief in pain or wound healing, and therefore above-knee amputation will be recommended.   The pseudoaneurysm was easily treated as noted above. With several small injections of thrombin, it has thrombosed, with preservation of the femoral artery.    ____________________________ [name], MD ggs:ST D: 06/18/2014 17:09:28 ET T: 06/19/2014 01:56:19 ET JOB#: [id number]  cc: [name], MD, <Dictator> GREGORY  Eloise Levels MD ELECTRONICALLY SIGNED 07/02/2014 22:39

## 2015-02-15 NOTE — Op Note (Signed)
PATIENT NAME:  Stephen Camacho, Stephen Camacho MR#:  [id number] DATE OF BIRTH:  21-Feb-1946  DATE OF PROCEDURE:  01/23/2014  PREOPERATIVE DIAGNOSES: 1.  Peripheral arterial disease with rest pain in the left lower extremity with occluded femoral to popliteal bypass graft.  2.  Diabetes.  3.  Hypertension.   POSTOPERATIVE DIAGNOSES: 1.  Peripheral arterial disease with rest pain in the left lower extremity with occluded femoral to popliteal bypass graft.  2.  Diabetes.  3.  Hypertension.  PROCEDURES: 1.  Ultrasound guidance for vascular access, right femoral artery.  2.  Catheter placement into left posterior tibial artery from right femoral approach.  3.  Aortogram and selective left lower extremity angiogram.  4.  Catheter directed thrombolysis with 8 mg of t-PA and mechanical rheolytic thrombectomy to the left common femoral artery, femoral popliteal bypass and distal popliteal artery, tibioperoneal trunk.  5.  Placement of overnight infusion catheter for thrombolysis, 135 total length, 50 cm working length into left femoral to popliteal bypass down to tibioperoneal trunk.   SURGEON:  [name], M.D.   ANESTHESIA:  Local with moderate conscious sedation.   ESTIMATED BLOOD LOSS:  Minimal.   FLUOROSCOPY TIME:  Approximately five minutes.   CONTRAST USED:  45 mL of contrast was used.  INDICATION FOR PROCEDURE:  A 69 year old gentleman with severe peripheral vascular disease.  He had a femoral and popliteal bypass performed a couple of years ago.  He has not followed up for his regular appointments and he came in today with a 24-hour history of ischemic rest pain of the left foot.  Noninvasive studies at the office showed occlusion of his bypass and his runoff.  He is brought in for an attempt at salvage of the bypass and revascularization if this is unsuccessful he will very likely need an amputation.  Informed consent is obtained.   DESCRIPTION OF PROCEDURE:  The patient is brought to the vascular  suite.  Groins were shaved and prepped and a sterile surgical field was created.  The right femoral is localized with fluoroscopy.  The right femoral artery is visualized with ultrasound and found to be patent.  It was accessed under direct ultrasound guidance without difficulty with a Seldinger needle.  J-wire and 5 French sheaths were then placed.  A pigtail catheter was placed at the L1 to L2 level and AP aortogram was performed.  This showed single renal arteries bilaterally.  The left renal artery had what appeared to be at least a 70% to 75% stenosis, about 1 cm beyond the origin.  The right renal artery was patent.  The aorta and iliac segments were patent, although the left iliac filled slowly.  I then crossed the aortic bifurcation.  I advanced to the left femoral head and selective left lower extremity angiogram was then performed.  This showed occlusion of the common femoral artery, profunda femoris artery, superficial femoral artery and the bypass graft with very minimal reconstitution distally in the distal profunda branches.  The patient was given intravenous heparin.  A 6 French Ansell sheath was placed over a Truman advantage wire.  I was able to gain access to the bypass graft with the advantage wire and a Kumpe catheter and advanced through the bypass graft and get into what appeared to be the tibioperoneal trunk distally, although there was still thrombus and very poor flow distally.  I passed a wire down into the distal posterior tibial artery.  I then delivered 8 mg of t-PA with the AngioJet  catheter and performed mechanical rheolytic thrombectomy with the AngioJet catheter.  This was instilled from the common femoral artery throughout the femoral popliteal bypass, the distal popliteal artery, tibioperoneal trunk down to the proximal and posterior tibial artery.  Following this, there was still occlusion of the graft with thrombus present and I elected to leave an infusion catheter for overnight  thrombolysis, 135 total length, 50 cm working length was placed with the distal tip in the tibioperoneal trunk.  The proximal tip was in the proximal portion of the femoral popliteal bypass graft.  The sheath was pulled back into the common femoral artery.  Heparin will be instilled through the sheath and t-PA will be instilled through the catheter.  It was secured to the skin with Prolene sutures.  The patient was then taken to the Critical Care Unit in stable condition.    ____________________________ [name], MD jsd:ea D: 01/23/2014 17:44:47 ET T: 01/24/2014 00:08:02 ET JOB#: [id number]  cc: [name], MD, <Dictator> [name] MD ELECTRONICALLY SIGNED 01/28/2014 9:54

## 2015-02-15 NOTE — Op Note (Signed)
PATIENT NAME:  Stephen Camacho, Stephen Camacho MR#:  [id number] DATE OF BIRTH:  Dec 20, 1945  DATE OF PROCEDURE:  06/14/2014  PREOPERATIVE DIAGNOSES: 1.  Ischemic right lower extremity with rest pain, acute on chronic with occlusion of femoral to popliteal bypass and previous thrombolysis catheter placements.  2.  Hypertension.  3.  Diabetes.   POSTOPERATIVE DIAGNOSES: 1.  Ischemic right lower extremity with rest pain, acute on chronic with occlusion of femoral to popliteal bypass and previous thrombolysis catheter placements.  2.  Hypertension.  3.  Diabetes.   PROCEDURES PERFORMED: 1.  Angiogram through existing catheter, right lower extremity.  2.  Mechanical rheolytic thrombectomy to right femoral to distal bypass and posterior tibial artery.  3.  Percutaneous transluminal angioplasty of distal bypass anastomosis of posterior tibial artery with 4 mm diameter angioplasty balloon.  4.  StarClose closure device, left femoral artery.   SURGEON: [name], M.D.   ANESTHESIA: Local with moderate conscious sedation.   ESTIMATED BLOOD LOSS: Minimal.   INDICATION FOR PROCEDURE: A 69 year old gentleman who has been running on thrombolysis for over 24 hours now. He was started yesterday. He was brought back over this morning, but there was still residual thrombus. He comes back for another attempt at revascularization.   DESCRIPTION OF PROCEDURE: The patient is brought back to the vascular suite. Existing catheter and sheath and groins were sterilely prepped and draped and a sterile surgical field was created. The existing thrombolysis catheter was removed and rewired with a Magic torque wire. Imaging was performed through the sheath that was parked in the common femoral artery on the right. Demonstrated restoration of flow within the profunda femoris artery, but initially there was no flow within the bypass graft. We performed mechanical rheolytic thrombectomy from the common femoral artery through the bypass  graft down in the posterior tibial artery for about 150 mL of effluent returned. At this point, there was flow within the bypass graft, but it was very sluggish due to thrombus located at the distal bypass anastomosis and poor runoff distally. At this point, we localized mechanical rheolytic thrombectomy to the distal bypass anastomosis in the posterior tibial artery. I then treated the posterior tibial artery and the distal bypass anastomosis with a 4 mm diameter angioplasty balloon. This resulted in restored patency of the graft, although the flow was still slow and the runoff was sluggish distally. I gave intra-arterial nitroglycerin to help some of the spasm that was present, but there was still some sluggish flow. At this point, the graft was opened. The profunda femoris artery had been reopened and I felt we had done all we could do from an endovascular standpoint. We will keep him on an Aggrastat drip in hopes of improving his distal runoff and removing any residual thrombus. The sheath was removed. StarClose closure device was deployed in the usual fashion with excellent hemostatic result. The patient tolerated the procedure well and was taken to the recovery room in stable condition.   ____________________________ [name], MD jsd:sb D: 06/14/2014 13:13:07 ET T: 06/14/2014 13:58:42 ET JOB#: [id number]  cc: [name], MD, <Dictator> [name] MD ELECTRONICALLY SIGNED 06/19/2014 10:33

## 2015-02-16 NOTE — Discharge Summary (Signed)
PATIENT NAME:  Stephen Camacho, Stephen Camacho MR#:  [id number] DATE OF BIRTH:  05/24/1946  DATE OF ADMISSION:  02/01/2012 DATE OF DISCHARGE:  02/09/2012  DISCHARGE DIAGNOSIS:  Atherosclerotic occlusive disease bilateral lower extremities with gangrene of the right great toe.   SECONDARY DIAGNOSES:  1. Hypertension.  2. Atherosclerotic occlusive disease bilateral lower extremities.  3. Hypercholesterolemia.  4. Hypercoagulable state.  5. Diabetes.   PROCEDURES PERFORMED:  1. Right femoral to posterior tibial bypass using prosthetic Spiro graft 02/01/2012.  2. Amputation right great toe 02/04/2012.   HISTORY OF PRESENT ILLNESS: Mr. Ishler is a 69 year old gentleman who underwent femoral to below-knee popliteal bypass grafting on the left leg several months ago for limb salvage secondary to gangrene of the toes. He now presents with gangrene of the right great toe. The risks and benefits for revascularization versus limb loss were reviewed. The patient has agreed to undergo surgical bypass for limb salvage.   HOSPITAL COURSE: On the day of admission, he underwent successful right femoral to posterior tibial bypass grafting. Postoperatively he did well, maintained a palpable posterior tibial pulse and on postoperative day #3 underwent amputation of his right great gangrenous toe. Following that he was initiated back on his anticoagulation and over the course of the next five postoperative days he was re- Coumadinized. There were no significant deviations from normal postoperative course. On hospital day #8 he is felt fit for discharge. He is ambulating independently in the hall. He is to maintain a diabetic diet. His home medications are as listed in the computer. He is taking Percocet for pain.  He will have his INR followed in the office.      He will follow up with me also for suture removal and postoperative surveillance.    ____________________________ [name], MD ggs:bjt D: 02/22/2012  14:01:36 ET T: 02/22/2012 16:26:45 ET JOB#: [id number]  cc: [name], MD, <Dictator> [name] MD ELECTRONICALLY SIGNED 03/03/2012 7:51

## 2015-02-16 NOTE — Op Note (Signed)
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    PROCEDURE PERFORMED: Right femoral to posterior tibial distal bypass using prosthetic PTFE Spiro-Graft   SURGEON: [name], MD  ANESTHESIA: General by endotracheal intubation.   FLUIDS: Per anesthesia record.   ESTIMATED BLOOD LOSS: 100 mL.   SPECIMEN: None.   INDICATIONS: Stephen Camacho is a 69 year old gentleman who presented to the office with worsening rest pain and gangrenous changes associated with the great toe. Risks and benefits for angiography with the hope for intervention were reviewed. The patient agreed to proceed and subsequent angiography demonstrated an unacceptable situation for intervention. He has, therefore, been prepped for bypass surgery. The risks and benefits were reviewed with the patient and family. All questions are answered. Patient agrees to proceed.   DESCRIPTION OF PROCEDURE: Patient is taken to the Operating Room and placed in supine position. After adequate general anesthesia is induced and appropriate invasive monitors are placed, he is positioned supine. Right leg and right lower quadrant of the abdomen are prepped and draped in a sterile fashion.   Vertical incision is created overlying the right femoral impulse and the dissection is carried down through the soft tissues. Lymphatics are ligated and divided between 2-0 silk ties. A StarClose device is identified. This is excised. Common femoral artery is then identified and dissected circumferentially proximally and distally and looped with Silastic vessel loops.   Medial calf incision is then created and the dissection is carried down  through the soft tissue. Great saphenous vein is exposed at this level and found to have a 6 to 10 cm sclerotic segment. It is unacceptable for use as bypass conduit. Therefore, the fascia is then opened. Muscles reflected posteriorly and posterior tibial neurovascular bundle is identified. Posterior tibial artery is then freed circumferentially and looped with blue silastic vessel loops.   The ring reinforced PTFE Spiro-Graft is opened onto the field and placed in normal saline. Medial thigh incision is made just above the knee and carried down to expose the neurovascular bundle and then using a straight Gore tunneling device the Spiro-Graft is pulled beginning at the tibial level and ending up in the groin. Leg is straightened, adjusted for length. 6000 units of heparin is given and allowed to circulate.   The posterior tibial is then occluded proximally and distally, arteriotomy made, extended with Potts scissors and the Spiro-Graft is anastomosed in an end graft to side posterior tibial artery fashion using running CV-7 suture. A gap is made in the medial side and two interrupted sutures are used for maintaining tension of the suture line.   The common femoral artery is clamped proximally and distally, opened with an 11 blade, extended with Potts scissors. Graft is trimmed to the appropriate length, again having straightened the leg and an end graft to side femoral artery anastomosis is fashioned with running CV-6 suture. The suture line is completed. The graft is then flushed and then irrigated with heparinized saline. The tibial suture line is then completed without difficulty and flow is established first into the profunda then down the graft and then retrograde up the posterior tibial and then antegrade down the posterior tibial to the foot. Two areas of the distal anastomosis were noted to be  bleeding more than just a suture hole and these were easily controlled with interrupted CV-7 suture.  Inspection of the distal anastomosis demonstrated bleeding from needle holes only and Evicel and Surgicel were placed. In a similar fashion, the proximal anastomosis was inspected, Evicel and Surgicel was placed. After approximately five minutes the proximal anastomosis was dry. Surgicel was replaced with fresh and the distal anastomosis was still oozing slightly and therefore 1 gram Aristo powder was opened and used to achieve hemostasis distally.   Both wounds were then irrigated and closed in layers using 2-0 Vicryl followed by several layers of 3-0. A  total of five layers were used in the groin and three layers in the calf. Two layers were used in the thigh incision. Staples were used to close skin. Sterile dressing and an Ace wrap was applied. Patient tolerated procedure well and there were no immediate complications. Sponge and needle counts were correct and he was taken to the recovery area in stable condition.   ____________________________ [name], MD ggs:cms D: 02/01/2012 11:49:14 ET T: 02/01/2012 12:57:35 ET JOB#: [id number]  cc: [name], MD, <Dictator> [name] MD ELECTRONICALLY SIGNED 02/02/2012 14:09

## 2015-02-16 NOTE — Discharge Summary (Signed)
PATIENT NAME:  Stephen Camacho, Stephen Camacho MR#:  [id number] DATE OF BIRTH:  07/12/1946  DATE OF ADMISSION:  12/24/2011 DATE OF DISCHARGE:  12/30/2011  DISCHARGE DIAGNOSES: Atherosclerotic occlusive disease bilateral lower extremities with ulceration and rest pain of the left lower extremity.   PROCEDURE PERFORMED: Left femoral to below-knee popliteal bypass graft using a 6 mm PTFE prosthetic.   HISTORY OF PRESENT ILLNESS: Mr. Borjon is a 69 year old gentleman who presented to the office with increasing pain in his left lower extremity as well as ischemic fissures. He has been prepared for limb salvage, attempts at intervention were unsuccessful, and he is therefore undergoing formal bypass surgery. The risks and benefits as well as the indications were reviewed in detail with the patient. He has agreed to proceed.   HOSPITAL COURSE: On the day of admission, the patient underwent successful left femoral to below-knee popliteal bypass grafting. He did well postoperatively and actually had a very normal course. On postoperative day number 6 he was felt fit for discharge. He was ambulating well. He was appropriately anticoagulated given his below knee prosthetic.   On the seventh of March he is discharged to home. He is in improved condition. He will follow up with me in the office in 1 to 2 weeks. He will continue his previous diet.  His medications are the same with the exception of the addition of Coumadin.    ____________________________ [name], MD ggs:kma D: 01/11/2012 14:15:33 ET T: 01/11/2012 14:43:22 ET JOB#: [id number]  cc: [name], MD, <Dictator> [name] MD ELECTRONICALLY SIGNED 01/14/2012 17:43

## 2015-02-16 NOTE — Op Note (Signed)
PATIENT NAME:  Stephen Camacho, Stephen Camacho MR#:  [id number] DATE OF BIRTH:  Mar 21, 1946  DATE OF PROCEDURE:  12/24/2011  PREOPERATIVE DIAGNOSIS: Atherosclerotic occlusive disease of bilateral lower extremities with rest pain of the left lower extremity.   POSTOPERATIVE DIAGNOSIS: Atherosclerotic occlusive disease of bilateral lower extremities with rest pain of the left lower extremity.   PROCEDURE PERFORMED:  Left femoral to below-knee popliteal bypass grafting using 6 mm PTFE.   SURGEON: Hortencia Pilar, MD   FIRST ASSISTANT: Real Cons, PA-C   ANESTHESIA: General by endotracheal intubation.   FLUIDS: Per anesthesia record.   ESTIMATED BLOOD LOSS: 150 mL.   SPECIMEN: None.   INDICATIONS: Mr. Pavlov is a 69 year old gentleman who presented to the office with worsening pain in his left lower extremity. Noninvasive studies as well as physical examination supported severe atherosclerotic occlusive disease consistent with rest pain. Attempts at angiography and intervention were unsuccessful at recanalizing blood flow to the distal limb, and he is undergoing bypass surgery. The risks and benefits were reviewed. All questions are answered. The patient agrees to proceed.   DESCRIPTION OF PROCEDURE: The patient is taken to the Operating Room and placed in the supine position. After adequate general anesthesia is induced and appropriate invasive monitors are placed, he is positioned supine and his left leg is prepped and draped in a sterile fashion.   A vertical incision is created in the groin, and the dissection is carried down to expose the common femoral artery. The common femoral artery is dissected circumferentially at the level of the ilioinguinal ligament, and the dissection is carried proximally to loop the profunda femoris and superficial femoral. The saphenofemoral junction is exposed. Dissection is carried down for several centimeters, at which point the vein appears to have been previously  harvested.   The vein is then exposed in the calf as it is fairly visible, given the thin nature of his calf and below-knee area. At this level, the vein appears adequate but it is certainly not greater than 3.5 mm. Dissection is carried along exposing the vein more proximally, but again it is found to have been harvested approximately one handbreadth above the knee. This does not allow for adequate length of venous conduit. The medial incision is then carried to expose the neurovascular bundle above the knee. The dissection is then deepened below the knee to expose the popliteal artery and vein. The popliteal artery is dissected circumferentially, looped with vessel loops. The tunneling device is used to create a subsartorial tunnel from the above-knee incision to the groin incision, and a 6 mm PTFE graft is pulled subcutaneously maintaining orientation. Blunt dissection is then used to create a passage behind the knee, and the PTFE graft is pulled behind the knee. The PTFE graft being utilized is a ring reinforced 6 mm Propaten graft. Heparin, 5000 units, is given and allowed to circulate for four minutes.   The common femoral, followed by the profunda femoris and the superficial femoral artery are clamped. Arteriotomy is made. Vein prosthetic is beveled, and an end graft-to-side common femoral artery anastomosis is fashioned with running CV-6 suture. Flushing maneuvers are performed, and flow is established to the profunda again. The vein graft is irrigated copiously with heparinized saline and then occluded just above the anastomosis. The leg was then straightened and measured for length. The prosthetic is marked. It is then reflexed. Arteriotomy is made in the popliteal with an 11 blade and extended with Potts scissors. The Gore-Tex graft is then trimmed to the appropriate  length, and again an end graft-to-side popliteal artery anastomosis is fashioned using running CV-6 suture. Flushing maneuvers are  performed and flow established distally.  First it is established in a retrograde fashion headed proximally and then distally.   Palpable pulse is noted distally. All wounds are inspected for hemostasis. They are then closed in layers. The groin is closed in five layers using 2-0 Vicryl and 3-0 Vicryl. Staples are used for skin. The medial thigh incision and the medial calf incision are closed in several layers using 3-0 Vicryl, followed by staples. Incisional VAC is applied.     The patient tolerated the procedure well, and there were no immediate complications. Sponge and needle counts are correct. He is taken to the recovery room in stable condition.    ____________________________ [name], MD ggs:cbb D: 12/24/2011 17:19:33 ET T: 12/25/2011 08:04:03 ET JOB#: [id number]  cc: [name], MD, <Dictator> [name] MD ELECTRONICALLY SIGNED 12/31/2011 10:48

## 2015-02-16 NOTE — Op Note (Signed)
PATIENT NAME:  Stephen Camacho, Stephen Camacho MR#:  [id number] DATE OF BIRTH:  [birth year]  DATE OF PROCEDURE:  02/04/2012  PREOPERATIVE DIAGNOSIS: Gangrene, right great toe.   POSTOPERATIVE DIAGNOSIS: Gangrene, right great toe.   PROCEDURE PERFORMED: Right first ray amputation.   SURGEON: [name], M.D.   ANESTHESIA: General by LMA.   FLUIDS: Per anesthesia record.   ESTIMATED BLOOD LOSS: Minimal.   SPECIMEN: Right great toe to pathology for permanent section.   INDICATIONS: [name] is a 69 year old gentleman who has undergone successful bypass surgery and now is undergoing resection of a gangrenous toe, since he has adequate perfusion for healing. Risks and benefits were reviewed and the patient agrees to proceed.   DESCRIPTION OF PROCEDURE: The patient is taken to the operating room and placed in the supine position. After adequate general anesthesia is induced, the right foot is prepped and draped in a sterile fashion. 0.25% Marcaine without epinephrine is infiltrated into the first ray medial and lateral to the metatarsal shaft. The gangrenous toe is then incised circumferentially and the dissection is carried down through the MP joint. The plantar skin is largely viable and is saved, and the toe is then passed off the field. The metatarsal is then resected with rongeurs. The wound is then irrigated, hemostasis is obtained, and it is closed using interrupted 3-0 nylon       vertical mattress sutures. A sterile dressing is applied. The patient tolerated the procedure well and there were no immediate complications. Metatarsal head and distal toe is sent to pathology for permanent section.  ____________________________ [name], MD ggs:slb D: 02/04/2012 12:30:25 ET T: 02/04/2012 15:07:23 ET JOB#: [id number]  cc: [name], MD, <Dictator> [name] MD ELECTRONICALLY SIGNED 02/13/2012 13:55

## 2015-02-18 ENCOUNTER — Encounter: Payer: PPO | Admitting: Physical Therapy

## 2015-02-20 ENCOUNTER — Encounter: Payer: PPO | Admitting: Physical Therapy

## 2015-02-23 ENCOUNTER — Encounter: Payer: Self-pay | Admitting: Emergency Medicine

## 2015-02-23 ENCOUNTER — Emergency Department
Admission: EM | Admit: 2015-02-23 | Discharge: 2015-02-23 | Disposition: A | Discharge status: Home / Self Care | Payer: PPO | Attending: Emergency Medicine | Admitting: Emergency Medicine

## 2015-02-23 DIAGNOSIS — K91840 Postprocedural hemorrhage and hematoma of a digestive system organ or structure following a digestive system procedure: Secondary | ICD-10-CM | POA: Insufficient documentation

## 2015-02-23 DIAGNOSIS — I10 Essential (primary) hypertension: Secondary | ICD-10-CM | POA: Diagnosis not present

## 2015-02-23 DIAGNOSIS — E1151 Type 2 diabetes mellitus with diabetic peripheral angiopathy without gangrene: Secondary | ICD-10-CM | POA: Insufficient documentation

## 2015-02-23 DIAGNOSIS — K08409 Partial loss of teeth, unspecified cause, unspecified class: Secondary | ICD-10-CM | POA: Insufficient documentation

## 2015-02-23 DIAGNOSIS — K1379 Other lesions of oral mucosa: Secondary | ICD-10-CM | POA: Diagnosis present

## 2015-02-23 DIAGNOSIS — Z7982 Long term (current) use of aspirin: Secondary | ICD-10-CM | POA: Insufficient documentation

## 2015-02-23 DIAGNOSIS — I739 Peripheral vascular disease, unspecified: Secondary | ICD-10-CM | POA: Insufficient documentation

## 2015-02-23 DIAGNOSIS — Z794 Long term (current) use of insulin: Secondary | ICD-10-CM | POA: Insufficient documentation

## 2015-02-23 DIAGNOSIS — Z79899 Other long term (current) drug therapy: Secondary | ICD-10-CM | POA: Insufficient documentation

## 2015-02-23 MED ORDER — MICROFIBRILLAR COLL HEMOSTAT EX POWD
CUTANEOUS | Status: AC
  Administered 2015-02-23: 1 g
  Filled 2015-02-23: qty 5

## 2015-02-23 NOTE — ED Notes (Signed)
Per Dr. Jacqualine Code, do not discharge pt at this time. Avitene to be placed and pt to remain for continued monitored.

## 2015-02-23 NOTE — ED Notes (Signed)
Pt presents to ER alert and in NAD. Pt reports that he had teeth removed yesterday and awoke today with increased bleeding.

## 2015-02-23 NOTE — ED Provider Notes (Addendum)
[facility] Emergency Department Provider Note    ____________________________________________  Time seen: 7:05 AM   I have reviewed the triage vital signs and the nursing notes.   HISTORY  Chief Complaint Post-op Problem   Bleeding from gums after dental extraction    HPI Stephen Camacho is a 69 y.o. male who had approximately 4 teeth extracted on Saturday by oral surgeon.   He reports losing from several of his teeth related to taking Xarelto. Bleeding started yesterday evening and persisted throughout the night with slight amount of oozing. Patient notes only mild pain in the teeth. Patient has placed gauze and tea bags on teeth since arriving to the ER and reports that the bleeding appears to have stopped at this time. Patient reports no associated fever, no trouble swallowing, no headache, no trouble breathing, no chest pain.  Patient does not feel lightheaded and describes the bleeding as mild oozing, never any severe bleeding.   Past Medical History  Diagnosis Date  . Hyperlipidemia   . Hypertension   . Diabetes mellitus without complication   . CAD (coronary artery disease)   . PVD (peripheral vascular disease)   . Seizures   . Gangrene of foot   . Wheezing     Patient Active Problem List   Diagnosis Date Noted  . 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 Gangrene 07/22/2014  . PVD (peripheral vascular disease) 07/21/2014  . PAD (peripheral artery disease) 07/21/2014  . S/P AKA (above knee amputation) 07/21/2014  . CAD (coronary artery disease) 07/21/2014  . Dyslipidemia 07/21/2014  . Essential hypertension, benign 07/21/2014    Past Surgical History  Procedure Laterality Date  . Right aka  07-10-2014  . Left femoral popliteal bypass    . Right lung lobeectomy    . Coronary artery bypass graft      Current Outpatient Rx  Name  Route  Sig  Dispense  Refill  . aspirin 81 MG tablet   Oral   Take  81 mg by mouth daily.         Marland Kitchen atorvastatin (LIPITOR) 40 MG tablet   Oral   Take 40 mg by mouth daily at 6 PM.         . insulin lispro (HUMALOG) 100 UNIT/ML injection   Subcutaneous   Inject 6 Units into the skin 2 (two) times daily before a meal.         . isosorbide mononitrate (IMDUR) 60 MG 24 hr tablet   Oral   Take 60 mg by mouth daily.         Marland Kitchen lisinopril (PRINIVIL,ZESTRIL) 10 MG tablet   Oral   Take 10 mg by mouth daily.         . metFORMIN (GLUCOPHAGE) 500 MG tablet   Oral   Take by mouth 2 (two) times daily with a meal.         . metoprolol (LOPRESSOR) 50 MG tablet   Oral   Take 50 mg by mouth 2 (two) times daily.         Marland Kitchen oxyCODONE-acetaminophen (PERCOCET/ROXICET) 5-325 MG per tablet      Take one to two tablets by mouth every 6 hours as needed for pain   240 tablet   0    Xarelto  Allergies Review of patient's allergies indicates no known allergies.  History reviewed. No pertinent family history.  Social History History  Substance Use Topics  . Smoking status: Never Smoker   .  Smokeless tobacco: Not on file  . Alcohol Use: No    Review of Systems  Constitutional: Negative for fever. Eyes: Negative for visual changes. ENT: Negative for sore throat. Cardiovascular: Negative for chest pain. Respiratory: Negative for shortness of breath. Gastrointestinal: Negative for abdominal pain, vomiting and diarrhea. Genitourinary: Negative for dysuria. Musculoskeletal: Negative for back pain. Skin: Negative for rash. Neurological: Negative for headaches, focal weakness or numbness. Denies bruising or bleeding elsewhere.  10-point ROS otherwise negative.  ____________________________________________   PHYSICAL EXAM:  VITAL SIGNS: ED Triage Vitals  Enc Vitals Group     BP 02/23/15 0544 94/64 mmHg     Pulse Rate 02/23/15 0544 108     Resp 02/23/15 0544 20     Temp 02/23/15 0544 96.8 F (36 C)     Temp Source 02/23/15 0544  Tympanic     SpO2 02/23/15 0544 100 %     Weight 02/23/15 0544 150 lb (68.04 kg)     Height 02/23/15 0544 '6\' 4"'$  (1.93 m)     Head Cir --      Peak Flow --      Pain Score --      Pain Loc --      Pain Edu? --      Excl. in [place]? --      Constitutional: Alert and oriented. Well appearing and in no distress. Eyes: Conjunctivae are normal. PERRL. Normal extraocular movements. ENT   Head: Normocephalic and atraumatic.   Nose: No congestion/rhinnorhea.   Mouth/Throat: Mucous membranes are moist. Patient has multiple molars which have been extracted, at present he has adherent clot located on each new extraction cavity. There is no evidence of active bleeding at this time. There is erythema and tenderness around each extraction site which is likely expected postoperatively, there is no purulent drainage, there is no sub-lingular edema, airway is patent.   Neck: No stridor. Hematological/Lymphatic/Immunilogical: No cervical lymphadenopathy. Cardiovascular: Normal rate, regular rhythm. Normal and symmetric distal pulses are present in all extremities. No murmurs, rubs, or gallops. Respiratory: Normal respiratory effort without tachypnea nor retractions. Breath sounds are clear and equal bilaterally. No wheezes/rales/rhonchi. Gastrointestinal: Soft and nontender. No distention. No abdominal bruits. There is no CVA tenderness. Musculoskeletal: Nontender with normal range of motion in all extremities. No joint effusions.   Right lower leg:  Status post AKA.   Left lower leg:  No tenderness or edema. Neurologic:  Normal speech and language. No gross focal neurologic deficits are appreciated. Speech is normal.  Skin:  Skin is warm, dry and intact. No rash noted. Psychiatric: Mood and affect are normal. Speech and behavior are normal. Patient exhibits appropriate insight and  judgment.  ____________________________________________   EKG    ____________________________________________    RADIOLOGY    ____________________________________________   PROCEDURES  Procedure(s) performed: None  Critical Care performed: No  ____________________________________________   INITIAL IMPRESSION / ASSESSMENT AND PLAN / ED COURSE  Pertinent labs & imaging results that were available during my care of the patient were reviewed by me and considered in my medical decision making (see chart for details).  Patient appears to have postoperative bleeding, which I would describe as slightly oozing, after multiple tooth extractions on Xarelto. At the present time his bleeding is controlled with pressure and having placed teabags in the mouth. I removed the teabags at 7:25 AM and bleeding appears to stopped entirely.  I discussed with the patient and his daughter return precautions and to call his oral surgeon Monday  for follow-up and reexamination at that time.  Patient is agreeable with the plan to discharge. He will continue to use gauze dressings every 2 hours as previously recommended by his oral surgeon.    ____________________________________________   FINAL CLINICAL IMPRESSION(S) / ED DIAGNOSES  Final diagnoses:  Oral bleeding     Delman Kitten, MD 02/23/15 [id number]  Time stamp  Patient reports that his gums began oozing again, however I have reevaluated he does have slight oozing coming from the left inferior molar. Had applied Avitene mist and gauze packing at this time with relief. We'll reevaluate again shortly to assure improvement.  Delman Kitten, MD 03/08/15 [phone]

## 2015-02-23 NOTE — ED Notes (Signed)
RN  Applied tea bags to lower extraction sites.

## 2015-02-23 NOTE — Discharge Instructions (Signed)
Please call your oral surgeon on Monday to set up follow-up care.  If you notice that small amounts of oozing are recurring you may continue to use gauze packings as you have previously.  Please return to the ER right away if you have significant or severe bleeding, develop fever, developed severe pain in the mouth or jaw, headache, or other any other new conditions or concerns arise. thank you.

## 2015-06-23 ENCOUNTER — Emergency Department: Payer: PPO

## 2015-06-23 ENCOUNTER — Encounter: Payer: Self-pay | Admitting: Urgent Care

## 2015-06-23 ENCOUNTER — Observation Stay
Admission: EM | Admit: 2015-06-23 | Discharge: 2015-06-24 | Disposition: A | Discharge status: Home / Self Care | Payer: PPO | Attending: Internal Medicine | Admitting: Internal Medicine

## 2015-06-23 DIAGNOSIS — Z9861 Coronary angioplasty status: Secondary | ICD-10-CM | POA: Insufficient documentation

## 2015-06-23 DIAGNOSIS — R0602 Shortness of breath: Secondary | ICD-10-CM | POA: Diagnosis not present

## 2015-06-23 DIAGNOSIS — R000 Tachycardia, unspecified: Secondary | ICD-10-CM | POA: Diagnosis not present

## 2015-06-23 DIAGNOSIS — I739 Peripheral vascular disease, unspecified: Secondary | ICD-10-CM | POA: Insufficient documentation

## 2015-06-23 DIAGNOSIS — E1151 Type 2 diabetes mellitus with diabetic peripheral angiopathy without gangrene: Secondary | ICD-10-CM | POA: Diagnosis not present

## 2015-06-23 DIAGNOSIS — F0390 Unspecified dementia without behavioral disturbance: Secondary | ICD-10-CM | POA: Diagnosis not present

## 2015-06-23 DIAGNOSIS — G40909 Epilepsy, unspecified, not intractable, without status epilepticus: Secondary | ICD-10-CM | POA: Diagnosis not present

## 2015-06-23 DIAGNOSIS — I10 Essential (primary) hypertension: Secondary | ICD-10-CM | POA: Diagnosis not present

## 2015-06-23 DIAGNOSIS — R079 Chest pain, unspecified: Principal | ICD-10-CM | POA: Insufficient documentation

## 2015-06-23 DIAGNOSIS — I6523 Occlusion and stenosis of bilateral carotid arteries: Secondary | ICD-10-CM | POA: Insufficient documentation

## 2015-06-23 DIAGNOSIS — Z7982 Long term (current) use of aspirin: Secondary | ICD-10-CM | POA: Insufficient documentation

## 2015-06-23 DIAGNOSIS — Z85118 Personal history of other malignant neoplasm of bronchus and lung: Secondary | ICD-10-CM | POA: Diagnosis not present

## 2015-06-23 DIAGNOSIS — Z89611 Acquired absence of right leg above knee: Secondary | ICD-10-CM | POA: Diagnosis not present

## 2015-06-23 DIAGNOSIS — Z89421 Acquired absence of other right toe(s): Secondary | ICD-10-CM | POA: Diagnosis not present

## 2015-06-23 DIAGNOSIS — H539 Unspecified visual disturbance: Secondary | ICD-10-CM | POA: Diagnosis not present

## 2015-06-23 DIAGNOSIS — Z794 Long term (current) use of insulin: Secondary | ICD-10-CM | POA: Insufficient documentation

## 2015-06-23 DIAGNOSIS — I2510 Atherosclerotic heart disease of native coronary artery without angina pectoris: Secondary | ICD-10-CM

## 2015-06-23 DIAGNOSIS — E785 Hyperlipidemia, unspecified: Secondary | ICD-10-CM | POA: Diagnosis not present

## 2015-06-23 DIAGNOSIS — Z8673 Personal history of transient ischemic attack (TIA), and cerebral infarction without residual deficits: Secondary | ICD-10-CM | POA: Insufficient documentation

## 2015-06-23 DIAGNOSIS — Z79899 Other long term (current) drug therapy: Secondary | ICD-10-CM | POA: Insufficient documentation

## 2015-06-23 DIAGNOSIS — Z951 Presence of aortocoronary bypass graft: Secondary | ICD-10-CM | POA: Insufficient documentation

## 2015-06-23 DIAGNOSIS — Z8546 Personal history of malignant neoplasm of prostate: Secondary | ICD-10-CM | POA: Insufficient documentation

## 2015-06-23 DIAGNOSIS — R918 Other nonspecific abnormal finding of lung field: Secondary | ICD-10-CM | POA: Insufficient documentation

## 2015-06-23 HISTORY — DX: Peripheral vascular disease, unspecified: I73.9

## 2015-06-23 HISTORY — DX: Malignant neoplasm of unspecified part of unspecified bronchus or lung: C34.90

## 2015-06-23 HISTORY — DX: Type 2 diabetes mellitus without complications: E11.9

## 2015-06-23 HISTORY — DX: Malignant neoplasm of prostate: C61

## 2015-06-23 HISTORY — DX: Cerebral infarction, unspecified: I63.9

## 2015-06-23 LAB — BASIC METABOLIC PANEL
Anion gap: 11 (ref 5–15)
BUN: 17 mg/dL (ref 6–20)
CHLORIDE: 101 mmol/L (ref 101–111)
CO2: 21 mmol/L — AB (ref 22–32)
CREATININE: 1.05 mg/dL (ref 0.61–1.24)
Calcium: 9.1 mg/dL (ref 8.9–10.3)
GFR calc Af Amer: >60 mL/min (ref >60)
GFR calc non Af Amer: >60 mL/min (ref >60)
GLUCOSE: 160 mg/dL — AB (ref 65–99)
Potassium: 4.4 mmol/L (ref 3.5–5.1)
SODIUM: 133 mmol/L — AB (ref 135–145)

## 2015-06-23 LAB — CBC
HEMATOCRIT: 36.1 % — AB (ref 40.0–52.0)
Hemoglobin: 11.2 g/dL — ABNORMAL LOW (ref 13.0–18.0)
MCH: 23.2 pg — AB (ref 26.0–34.0)
MCHC: 31 g/dL — AB (ref 32.0–36.0)
MCV: 74.9 fL — ABNORMAL LOW (ref 80.0–100.0)
Platelets: 227 10*3/uL (ref 150–440)
RBC: 4.82 MIL/uL (ref 4.40–5.90)
RDW: 18.8 % — AB (ref 11.5–14.5)
WBC: 7.9 10*3/uL (ref 3.8–10.6)

## 2015-06-23 LAB — TROPONIN I: Troponin I: <0.03 ng/mL (ref <0.031)

## 2015-06-23 MED ORDER — IOHEXOL 350 MG/ML SOLN
75.0000 mL | Freq: Once | INTRAVENOUS | Status: AC | PRN
  Administered 2015-06-23: 75 mL via INTRAVENOUS

## 2015-06-23 MED ORDER — SODIUM CHLORIDE 0.9 % IV BOLUS (SEPSIS)
1000.0000 mL | Freq: Once | INTRAVENOUS | Status: AC
  Administered 2015-06-23: 1000 mL via INTRAVENOUS

## 2015-06-23 MED ORDER — ASPIRIN 81 MG PO CHEW
324.0000 mg | CHEWABLE_TABLET | Freq: Once | ORAL | Status: AC
  Administered 2015-06-23: 324 mg via ORAL
  Filled 2015-06-23: qty 4

## 2015-06-23 NOTE — ED Provider Notes (Signed)
[facility] Emergency Department Provider Note  ____________________________________________  Time seen: Approximately 10 PM  I have reviewed the triage vital signs and the nursing notes.   HISTORY  Chief Complaint Chest Pain    HPI Stephen Camacho is a 69 y.o. male with a history of peripheral vascular disease, hyperlipidemia and diabetes who is presenting today with chest pain over the past day. He says that the pain is central and lasts seconds to minutes at a time. It is nonradiating and not associated with shortness of breath. There are no exacerbating factors and his chest pain-free at this time. He denies any stents in his heart but does have a stent in the left lower extremity. He has also had a BKA on the right.   Past Medical History  Diagnosis Date  . Hyperlipidemia   . Hypertension   . Diabetes mellitus without complication   . CAD (coronary artery disease)   . PVD (peripheral vascular disease)   . Seizures   . Gangrene of foot   . Wheezing     Patient Active Problem List   Diagnosis Date Noted  . 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 Gangrene 07/22/2014  . PVD (peripheral vascular disease) 07/21/2014  . PAD (peripheral artery disease) 07/21/2014  . S/P AKA (above knee amputation) 07/21/2014  . CAD (coronary artery disease) 07/21/2014  . Dyslipidemia 07/21/2014  . Essential hypertension, benign 07/21/2014    Past Surgical History  Procedure Laterality Date  . Right aka  07-10-2014  . Left femoral popliteal bypass    . Right lung lobeectomy    . Coronary artery bypass graft      Current Outpatient Rx  Name  Route  Sig  Dispense  Refill  . aspirin 81 MG tablet   Oral   Take 81 mg by mouth daily.         Marland Kitchen atorvastatin (LIPITOR) 40 MG tablet   Oral   Take 40 mg by mouth daily at 6 PM.         . insulin lispro (HUMALOG) 100 UNIT/ML injection   Subcutaneous   Inject 6 Units into the  skin 2 (two) times daily before a meal.         . isosorbide mononitrate (IMDUR) 60 MG 24 hr tablet   Oral   Take 60 mg by mouth daily.         Marland Kitchen lisinopril (PRINIVIL,ZESTRIL) 10 MG tablet   Oral   Take 10 mg by mouth daily.         . metFORMIN (GLUCOPHAGE) 500 MG tablet   Oral   Take by mouth 2 (two) times daily with a meal.         . metoprolol (LOPRESSOR) 50 MG tablet   Oral   Take 50 mg by mouth 2 (two) times daily.         Marland Kitchen oxyCODONE-acetaminophen (PERCOCET/ROXICET) 5-325 MG per tablet      Take one to two tablets by mouth every 6 hours as needed for pain   240 tablet   0     Allergies Review of patient's allergies indicates no known allergies.  No family history on file.  Social History Social History  Substance Use Topics  . Smoking status: Never Smoker   . Smokeless tobacco: None  . Alcohol Use: No    Review of Systems Constitutional: No fever/chills Eyes: No visual changes. ENT: No sore throat. Cardiovascular: As above Respiratory: Denies  shortness of breath. Gastrointestinal: No abdominal pain.  No nausea, no vomiting.  No diarrhea.  No constipation. Genitourinary: Negative for dysuria. Musculoskeletal: Negative for back pain. Skin: Negative for rash. Neurological: Negative for headaches, focal weakness or numbness.  10-point ROS otherwise negative.  ____________________________________________   PHYSICAL EXAM:  VITAL SIGNS: ED Triage Vitals  Enc Vitals Group     BP 06/23/15 1950 123/63 mmHg     Pulse Rate 06/23/15 1950 122     Resp 06/23/15 1950 16     Temp 06/23/15 1950 98.6 F (37 C)     Temp Source 06/23/15 1950 Oral     SpO2 06/23/15 1950 100 %     Weight 06/23/15 1950 180 lb (81.647 kg)     Height 06/23/15 1950 '6\' 4"'$  (1.93 m)     Head Cir --      Peak Flow --      Pain Score 06/23/15 1951 0     Pain Loc --      Pain Edu? --      Excl. in [place]? --     Constitutional: Alert and oriented. Well appearing and in no  acute distress. Eyes: Conjunctivae are normal. PERRL. EOMI. Head: Atraumatic. Nose: No congestion/rhinnorhea. Mouth/Throat: Mucous membranes are moist.  Oropharynx non-erythematous. Neck: No stridor.   Cardiovascular: Tachycardic, regular rhythm. Grossly normal heart sounds.  Good peripheral circulation. Respiratory: Normal respiratory effort.  No retractions. Lungs CTAB. Gastrointestinal: Soft and nontender. No distention. No abdominal bruits. No CVA tenderness. Musculoskeletal: No lower extremity tenderness nor edema.  No joint effusions. Right BKA with stump intact without any tenderness, induration, dehiscence or pus. Neurologic:  Normal speech and language. No gross focal neurologic deficits are appreciated. No gait instability. Skin:  Skin is warm, dry and intact. No rash noted. Psychiatric: Mood and affect are normal. Speech and behavior are normal.  ____________________________________________   LABS (all labs ordered are listed, but only abnormal results are displayed)  Labs Reviewed  BASIC METABOLIC PANEL - Abnormal; Notable for the following:    Sodium 133 (*)    CO2 21 (*)    Glucose, Bld 160 (*)    All other components within normal limits  CBC - Abnormal; Notable for the following:    Hemoglobin 11.2 (*)    HCT 36.1 (*)    MCV 74.9 (*)    MCH 23.2 (*)    MCHC 31.0 (*)    RDW 18.8 (*)    All other components within normal limits  TROPONIN I   ____________________________________________  EKG  ED ECG REPORT I, Doran Stabler, the attending physician, personally viewed and interpreted this ECG.   Date: 06/23/2015  EKG Time: 1959  Rate: 117  Rhythm: sinus tachycardia  Axis: Normal axis  Intervals:none  ST&T Change: T-wave inversion in V6 with possible mild depression versus poor baseline. Similar morphology to generate first 2016 except slightly more depressed.  ____________________________________________  RADIOLOGY  CT negative for acute  pulmonary embolism. Chronic emphysematous and pulmonary scarring changes but no acute pulmonary findings on the chest x-ray. I personally reviewed these images. ____________________________________________   PROCEDURES    ____________________________________________   INITIAL IMPRESSION / ASSESSMENT AND PLAN / ED COURSE  Pertinent labs & imaging results that were available during my care of the patient were reviewed by me and considered in my medical decision making (see chart for details).  ----------------------------------------- 12:07 AM on 06/24/2015 -----------------------------------------  Patient resting comfortable this time and still pain free. Heart  rate in the low 100s to 110. Unclear cause for the tachycardia. However, negative CTA for pulmonary embolus. We'll admit to the hospital for further evaluation of his chest pain. Signed out to Dr. Lavetta Nielsen. Plan explained To the patient as well as his daughter who is at the bedside. They understand the plan and are willing to comply. ____________________________________________   FINAL CLINICAL IMPRESSION(S) / ED DIAGNOSES  Acute chest pain with tachycardia. Initial visit.    Orbie Pyo, MD 06/24/15 [phone]

## 2015-06-23 NOTE — ED Notes (Addendum)
Patient presents with c/o an intermittednt CP to the LEFT side of his chest that began earlier today. Patient denies N/V, SOB, and diaphoresis. Patient initially check in as back pain and hand pain; never mentioned CP - denies back and hand pain at present. Patient's daughter states, "I dont want him discharged quickly. I want him to stay here for observation."

## 2015-06-23 NOTE — ED Notes (Signed)
EKG was reviewed by Dr. Marcelene Butte within 85mns

## 2015-06-24 ENCOUNTER — Observation Stay: Payer: PPO

## 2015-06-24 ENCOUNTER — Observation Stay: Admit: 2015-06-24 | Payer: PPO

## 2015-06-24 ENCOUNTER — Observation Stay (HOSPITAL_BASED_OUTPATIENT_CLINIC_OR_DEPARTMENT_OTHER)
Admit: 2015-06-24 | Discharge: 2015-06-24 | Disposition: A | Discharge status: Home / Self Care | Payer: PPO | Attending: Physician Assistant | Admitting: Physician Assistant

## 2015-06-24 ENCOUNTER — Encounter: Payer: Self-pay | Admitting: Physician Assistant

## 2015-06-24 DIAGNOSIS — I209 Angina pectoris, unspecified: Secondary | ICD-10-CM | POA: Diagnosis not present

## 2015-06-24 DIAGNOSIS — E1165 Type 2 diabetes mellitus with hyperglycemia: Secondary | ICD-10-CM

## 2015-06-24 DIAGNOSIS — I739 Peripheral vascular disease, unspecified: Secondary | ICD-10-CM

## 2015-06-24 DIAGNOSIS — I25709 Atherosclerosis of coronary artery bypass graft(s), unspecified, with unspecified angina pectoris: Secondary | ICD-10-CM

## 2015-06-24 DIAGNOSIS — I2510 Atherosclerotic heart disease of native coronary artery without angina pectoris: Secondary | ICD-10-CM

## 2015-06-24 DIAGNOSIS — R079 Chest pain, unspecified: Secondary | ICD-10-CM | POA: Diagnosis present

## 2015-06-24 LAB — TROPONIN I
Troponin I: <0.03 ng/mL (ref <0.031)
Troponin I: <0.03 ng/mL (ref <0.031)

## 2015-06-24 LAB — NM MYOCAR MULTI W/SPECT W/WALL MOTION / EF
CHL CUP NUCLEAR SRS: 0
CHL CUP RESTING HR STRESS: 73 {beats}/min
LVDIAVOL: 104 mL
LVSYSVOL: 50 mL
NUC STRESS TID: 0.89
Peak HR: 96 {beats}/min
Percent HR: 64 %
SDS: 2
SSS: 2

## 2015-06-24 LAB — CBC
HCT: 30.3 % — ABNORMAL LOW (ref 40.0–52.0)
Hemoglobin: 9.4 g/dL — ABNORMAL LOW (ref 13.0–18.0)
MCH: 22.8 pg — ABNORMAL LOW (ref 26.0–34.0)
MCHC: 30.9 g/dL — ABNORMAL LOW (ref 32.0–36.0)
MCV: 73.7 fL — ABNORMAL LOW (ref 80.0–100.0)
Platelets: 203 10*3/uL (ref 150–440)
RBC: 4.12 MIL/uL — ABNORMAL LOW (ref 4.40–5.90)
RDW: 18.4 % — ABNORMAL HIGH (ref 11.5–14.5)
WBC: 6.4 10*3/uL (ref 3.8–10.6)

## 2015-06-24 LAB — GLUCOSE, CAPILLARY
Glucose-Capillary: 165 mg/dL — ABNORMAL HIGH (ref 65–99)
Glucose-Capillary: 181 mg/dL — ABNORMAL HIGH (ref 65–99)
Glucose-Capillary: 89 mg/dL (ref 65–99)

## 2015-06-24 LAB — CREATININE, SERUM
Creatinine, Ser: 1.01 mg/dL (ref 0.61–1.24)
GFR calc Af Amer: >60 mL/min (ref >60)
GFR calc non Af Amer: >60 mL/min (ref >60)

## 2015-06-24 LAB — MAGNESIUM: Magnesium: 1.7 mg/dL (ref 1.7–2.4)

## 2015-06-24 LAB — HEMOGLOBIN A1C: Hgb A1c MFr Bld: 6.9 % — ABNORMAL HIGH (ref 4.0–6.0)

## 2015-06-24 LAB — TSH: TSH: 2.166 u[IU]/mL (ref 0.350–4.500)

## 2015-06-24 MED ORDER — INSULIN ASPART 100 UNIT/ML ~~LOC~~ SOLN
0.0000 [IU] | Freq: Three times a day (TID) | SUBCUTANEOUS | Status: DC
  Administered 2015-06-24 (×2): 3 [IU] via SUBCUTANEOUS
  Filled 2015-06-24 (×2): qty 3

## 2015-06-24 MED ORDER — MORPHINE SULFATE (PF) 2 MG/ML IV SOLN
2.0000 mg | INTRAVENOUS | Status: DC | PRN
  Administered 2015-06-24: 2 mg via INTRAVENOUS
  Filled 2015-06-24: qty 1

## 2015-06-24 MED ORDER — ASPIRIN EC 81 MG PO TBEC
81.0000 mg | DELAYED_RELEASE_TABLET | Freq: Every day | ORAL | Status: DC
  Administered 2015-06-24: 81 mg via ORAL
  Filled 2015-06-24: qty 1

## 2015-06-24 MED ORDER — TECHNETIUM TC 99M SESTAMIBI - CARDIOLITE
29.9300 | Freq: Once | INTRAVENOUS | Status: AC | PRN
  Administered 2015-06-24: 14:00:00 29.93 via INTRAVENOUS

## 2015-06-24 MED ORDER — PNEUMOCOCCAL VAC POLYVALENT 25 MCG/0.5ML IJ INJ: 0.5000 mL | INJECTION | INTRAMUSCULAR | Status: DC

## 2015-06-24 MED ORDER — TECHNETIUM TC 99M SESTAMIBI - CARDIOLITE
12.5300 | Freq: Once | INTRAVENOUS | Status: AC | PRN
  Administered 2015-06-24: 13:00:00 12.53 via INTRAVENOUS

## 2015-06-24 MED ORDER — ACETAMINOPHEN 650 MG RE SUPP: 650.0000 mg | Freq: Four times a day (QID) | RECTAL | Status: DC | PRN

## 2015-06-24 MED ORDER — INSULIN ASPART 100 UNIT/ML ~~LOC~~ SOLN: 0.0000 [IU] | Freq: Every day | SUBCUTANEOUS | Status: DC

## 2015-06-24 MED ORDER — INSULIN ASPART 100 UNIT/ML ~~LOC~~ SOLN: 6.0000 [IU] | Freq: Two times a day (BID) | SUBCUTANEOUS | Status: DC

## 2015-06-24 MED ORDER — INSULIN GLARGINE 100 UNIT/ML ~~LOC~~ SOLN
10.0000 [IU] | Freq: Every day | SUBCUTANEOUS | Status: DC
  Filled 2015-06-24: qty 0.1

## 2015-06-24 MED ORDER — ONDANSETRON HCL 4 MG PO TABS
4.0000 mg | ORAL_TABLET | Freq: Four times a day (QID) | ORAL | Status: DC | PRN
Start: 2015-06-24 — End: 2015-06-24

## 2015-06-24 MED ORDER — CILOSTAZOL 100 MG PO TABS
50.0000 mg | ORAL_TABLET | Freq: Two times a day (BID) | ORAL | Status: DC
  Administered 2015-06-24: 50 mg via ORAL
  Filled 2015-06-24 (×2): qty 1

## 2015-06-24 MED ORDER — INFLUENZA VAC SPLIT QUAD 0.5 ML IM SUSY: 0.5000 mL | PREFILLED_SYRINGE | INTRAMUSCULAR | Status: DC

## 2015-06-24 MED ORDER — REGADENOSON 0.4 MG/5ML IV SOLN
0.4000 mg | Freq: Once | INTRAVENOUS | Status: AC
  Administered 2015-06-24: 0.4 mg via INTRAVENOUS

## 2015-06-24 MED ORDER — ONDANSETRON HCL 4 MG/2ML IJ SOLN: 4.0000 mg | Freq: Four times a day (QID) | INTRAMUSCULAR | Status: DC | PRN

## 2015-06-24 MED ORDER — METOPROLOL TARTRATE 50 MG PO TABS
50.0000 mg | ORAL_TABLET | Freq: Two times a day (BID) | ORAL | Status: DC
  Administered 2015-06-24: 50 mg via ORAL
  Filled 2015-06-24: qty 1

## 2015-06-24 MED ORDER — LISINOPRIL 10 MG PO TABS
10.0000 mg | ORAL_TABLET | Freq: Every day | ORAL | Status: DC
  Administered 2015-06-24: 10 mg via ORAL
  Filled 2015-06-24: qty 1

## 2015-06-24 MED ORDER — ISOSORBIDE MONONITRATE ER 60 MG PO TB24
60.0000 mg | ORAL_TABLET | Freq: Every day | ORAL | Status: DC
  Administered 2015-06-24: 60 mg via ORAL
  Filled 2015-06-24: qty 1

## 2015-06-24 MED ORDER — ATORVASTATIN CALCIUM 20 MG PO TABS
40.0000 mg | ORAL_TABLET | Freq: Every day | ORAL | Status: DC
  Administered 2015-06-24: 40 mg via ORAL
  Filled 2015-06-24: qty 2

## 2015-06-24 MED ORDER — ACETAMINOPHEN 160 MG/5ML PO SUSP
ORAL | Status: AC
  Filled 2015-06-24: qty 5

## 2015-06-24 MED ORDER — SODIUM CHLORIDE 0.9 % IJ SOLN
3.0000 mL | Freq: Two times a day (BID) | INTRAMUSCULAR | Status: DC
Start: 2015-06-24 — End: 2015-06-24
  Administered 2015-06-24 (×2): 3 mL via INTRAVENOUS

## 2015-06-24 MED ORDER — HEPARIN SODIUM (PORCINE) 5000 UNIT/ML IJ SOLN: 5000.0000 [IU] | Freq: Three times a day (TID) | INTRAMUSCULAR | Status: DC

## 2015-06-24 MED ORDER — ACETAMINOPHEN 325 MG PO TABS
650.0000 mg | ORAL_TABLET | Freq: Four times a day (QID) | ORAL | Status: DC | PRN
  Administered 2015-06-24: 650 mg via ORAL
  Filled 2015-06-24: qty 2

## 2015-06-24 MED ORDER — DOCUSATE SODIUM 100 MG PO CAPS
100.0000 mg | ORAL_CAPSULE | Freq: Two times a day (BID) | ORAL | Status: DC
  Administered 2015-06-24: 100 mg via ORAL
  Filled 2015-06-24: qty 1

## 2015-06-24 MED ORDER — RIVAROXABAN 20 MG PO TABS
20.0000 mg | ORAL_TABLET | Freq: Every day | ORAL | Status: DC
  Administered 2015-06-24: 20 mg via ORAL
  Filled 2015-06-24: qty 1

## 2015-06-24 NOTE — Plan of Care (Signed)
Problem: Phase II Progression Outcomes Goal: Stress Test if indicated Outcome: Progressing Lexiscan today

## 2015-06-24 NOTE — Progress Notes (Signed)
Patient is admitted to room 251, alert and oriented but seems to be forgetful.   No acute distress noted. Received morphine 2 mg IV PRN for bilateral arms pain. On rechecked, patient was resting quietly with his eyes closed, respirations even and unlabored. Noted Right AKA.  NSR/Sinus tachy on the heart monitor. Skin assessment done with Lowella Petties RN. No other skin issues of concerns besides the R. AKA.

## 2015-06-24 NOTE — Progress Notes (Signed)
Dr. Vianne Bulls made aware of negative stress test, orders to dc.

## 2015-06-24 NOTE — Progress Notes (Signed)
Dr. Vianne Bulls made aware of completed/updated medication list.

## 2015-06-24 NOTE — Consult Note (Signed)
Cardiology Consultation Note  Patient ID: Stephen Camacho, MRN: [id number], DOB/AGE: 1946/06/22 69 y.o. Admit date: 06/23/2015   Date of Consult: 06/24/2015 Primary Physician: Beatriz Stallion, MD [facility]) Primary Cardiologist: [facility]  Chief Complaint: "Not feeling right" Reason for Consult: Chest pain  HPI: 69 y.o. male with h/o CAD s/p CABG (LIMA to LAD and SVG to OM in 2012) and DES LCx and OM, PAD s/p right SFA stent s/p right toe amputation 2011 s/p right AKA 06/2014 secondary to gangrene of the foot, prior stroke, DM2, HTN, HLD, seizure disorder, history of right lung carcinoma s/p lobectomy, prostate cancer and underlying dementia who presented to [facility] on 8/29 with complaints of "just not feeling right."  He is followed by [facility] with his last ischemic evaluation on 08/24/2012 by cardiac cath in which he underwent successful PCI to an 80% stenosis in the OM branch with placement of a Promus Element Plus 2.5 mm X 28 mm drug eluting stent with 0% residual stenosis and TIMI 3 flow and successful PCI to a subtotal occlusion of ostial LCx with placement of a 2.5 mm X 16 mm Promus Element Plus drug eluting stent with 0% residual stenosis and TIMI 3 flow. No further ischemic evaluations since. At his last appointment with [facility] on 04/2014 he was doing well.   His PVD ultimately progressed to the point requiring right AKA in 06/2014 after suffering thrombosis of his arterial bypass graft in around July 2015. Attempts at salvage were unsuccessful.     He presented to [facility] on 8/29 with complaints of "just not feeling right" per his report. He denies any chest pain, SOB, nausea, vomiting, diaphoresis, presyncope, palpitations, or syncope. He reports intermittent sensations of waves of "not feeling right." He is unable to tell me any further what these sensations are, though he does deny any chest pain. No exertional symptoms.   Upon his arrival to [facility] he was found to have troponin negative x 2, ECG  with sinus tachycardia, 117 bpm, inferolateral st/t changes. CXR with emphysema and no acute findings. CTA chest was negative for PE with prominent upper lobe bullae. He was eating breakfast this morning and denies any symptoms.       Past Medical History  Diagnosis Date  . Hyperlipidemia   . Hypertension   . Diabetes mellitus   . CAD (coronary artery disease)     a. s/p 2 v CABG in 2012 (LIMA-LAD & SVG-OM); b. cath 07/2012 s/p PCI/DES to ostial LCx and OM  . PAD (peripheral artery disease)     a. s/p right SFA stent; b. s/p right toe amputation 2011; c. s/p right AKA spring 2016  . Seizures   . Gangrene of foot   . Wheezing   . Lung cancer   . Prostate cancer   . Stroke       Most Recent Cardiac Studies: Cardiac bypass 2012:  LIMA to LAD, SVG to OM  Cardiac catheterization 08/24/2012:  Successful PCI to an 80% stenosis in the OM branch with placement of a Promus Element Plus 2.5 mm X 28 mm drug eluting stent with 0% residual stenosis  and TIMI 3 flow. Successful PCI to a subtotal occlusion of ostial LCx with placement of a 2.5 mm X 16 mm Promus Element Plus drug eluting stent with 0% residual stenosis and TIMI 3 flow    Surgical History:  Past Surgical History  Procedure Laterality Date  . Right aka  07-10-2014  . Left femoral popliteal bypass    .  Right lung lobeectomy    . Coronary artery bypass graft       Home Meds: Prior to Admission medications   Medication Sig Start Date End Date Taking? Authorizing Provider  aspirin 81 MG tablet Take 81 mg by mouth daily.    Historical Provider, MD  atorvastatin (LIPITOR) 40 MG tablet Take 40 mg by mouth daily at 6 PM.    Historical Provider, MD  insulin lispro (HUMALOG) 100 UNIT/ML injection Inject 6 Units into the skin 2 (two) times daily before a meal.    Historical Provider, MD  isosorbide mononitrate (IMDUR) 60 MG 24 hr tablet Take 60 mg by mouth daily.    Historical Provider, MD  lisinopril (PRINIVIL,ZESTRIL) 10 MG tablet  Take 10 mg by mouth daily.    Historical Provider, MD  metFORMIN (GLUCOPHAGE) 500 MG tablet Take by mouth 2 (two) times daily with a meal.    Historical Provider, MD  metoprolol (LOPRESSOR) 50 MG tablet Take 50 mg by mouth 2 (two) times daily.    Historical Provider, MD  oxyCODONE-acetaminophen (PERCOCET/ROXICET) 5-325 MG per tablet Take one to two tablets by mouth every 6 hours as needed for pain 08/05/14   Lauree Chandler, NP    Inpatient Medications:  . acetaminophen      . aspirin EC  81 mg Oral Daily  . atorvastatin  40 mg Oral q1800  . docusate sodium  100 mg Oral BID  . heparin  5,000 Units Subcutaneous 3 times per day  . [START ON 06/25/2015] Influenza vac split quadrivalent PF  0.5 mL Intramuscular Tomorrow-1000  . insulin aspart  0-15 Units Subcutaneous TID WC  . insulin aspart  0-5 Units Subcutaneous QHS  . isosorbide mononitrate  60 mg Oral Daily  . lisinopril  10 mg Oral Daily  . metoprolol  50 mg Oral BID  . [START ON 06/25/2015] pneumococcal 23 valent vaccine  0.5 mL Intramuscular Tomorrow-1000  . sodium chloride  3 mL Intravenous Q12H      Allergies: No Known Allergies  Social History   Social History  . Marital Status: Widowed    Spouse Name: N/A  . Number of Children: N/A  . Years of Education: N/A   Occupational History  . Not on file.   Social History Main Topics  . Smoking status: Never Smoker   . Smokeless tobacco: Not on file  . Alcohol Use: No  . Drug Use: Not on file  . Sexual Activity: Not on file   Other Topics Concern  . Not on file   Social History Narrative     Family History  Problem Relation Age of Onset  . Family history unknown: Yes    Patient reports he cannot remember his family medical history.   Review of Systems: Review of Systems  Constitutional: Positive for weight loss and malaise/fatigue. Negative for fever, chills and diaphoresis.  HENT: Negative for congestion.   Eyes: Negative for discharge and redness.    Respiratory: Negative for cough, hemoptysis, sputum production, shortness of breath and wheezing.   Cardiovascular: Negative for chest pain, palpitations, orthopnea, claudication, leg swelling and PND.  Gastrointestinal: Negative for heartburn, nausea, vomiting, blood in stool and melena.  Genitourinary: Negative for hematuria.  Musculoskeletal: Negative for myalgias and falls.  Skin: Negative for rash.  Neurological: Positive for weakness. Negative for dizziness, tingling, tremors, sensory change, speech change, focal weakness, seizures and loss of consciousness.  Endo/Heme/Allergies: Does not bruise/bleed easily.  Psychiatric/Behavioral: Negative for substance abuse. The patient  is not nervous/anxious.   All other systems reviewed and are negative.    Labs:  Recent Labs  06/23/15 2009 06/24/15 0324  TROPONINI <0.03 <0.03   Lab Results  Component Value Date   WBC 6.4 06/24/2015   HGB 9.4* 06/24/2015   HCT 30.3* 06/24/2015   MCV 73.7* 06/24/2015   PLT 203 06/24/2015     Recent Labs Lab 06/23/15 2009 06/24/15 0324  NA 133*  --   K 4.4  --   CL 101  --   CO2 21*  --   BUN 17  --   CREATININE 1.05 1.01  CALCIUM 9.1  --   GLUCOSE 160*  --    No results found for: CHOL, HDL, LDLCALC, TRIG No results found for: DDIMER  Radiology/Studies:  Dg Chest 2 View  06/23/2015   CLINICAL DATA:  Left-sided chest pain and shortness of breath.  EXAM: CHEST  2 VIEW  COMPARISON:  07/08/2014  FINDINGS: The cardiac silhouette, mediastinal and hilar contours are within normal limits and stable. Stable surgical changes from bypass surgery. Stable emphysematous changes pulmonary scarring and surgical scarring changes. No acute overlying pulmonary process. Stable eventration of left hemidiaphragm there is a prominent skin fold across the left chest but no pneumothorax. Artifact over the last chest is likely EKG lead.  IMPRESSION: Chronic emphysematous and pulmonary scarring changes but no acute  pulmonary findings.   Electronically Signed   By: Marijo Sanes M.D.   On: 06/23/2015 20:37   Ct Angio Chest Pe W/cm &/or Wo Cm  06/23/2015   CLINICAL DATA:  Intermittent left-sided chest pain, onset earlier today.  EXAM: CT ANGIOGRAPHY CHEST WITH CONTRAST  TECHNIQUE: Multidetector CT imaging of the chest was performed using the standard protocol during bolus administration of intravenous contrast. Multiplanar CT image reconstructions and MIPs were obtained to evaluate the vascular anatomy.  CONTRAST:  96m OMNIPAQUE IOHEXOL 350 MG/ML SOLN  COMPARISON:  None.  FINDINGS: Cardiovascular: There is good opacification of the pulmonary arteries. There is no pulmonary embolism. The thoracic aorta is normal in caliber and intact. There is prior coronary artery bypass grafting.  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 Linear scarring is present in the bases. No mass or significant nodule is evident.  Central airways: Patent  Effusions: None  Lymphadenopathy: None  Esophagus: Unremarkable  Upper abdomen: Low-attenuation lesions are visible in the upper poles of the incompletely imaged kidneys, probably cysts but not conclusively characterized.  Musculoskeletal: No significant musculoskeletal lesion.  Review of the MIP images confirms the above findings.  IMPRESSION: 1. Negative for acute pulmonary embolism 2. Severe emphysematous changes with prominent upper lobe bullae.   Electronically Signed   By: DAndreas NewportM.D.   On: 06/23/2015 23:26    EKG: sinus tachycardia, 117 bpm, inferolateral st/t changes   Weights: Filed Weights   06/23/15 1950 06/24/15 0657  Weight: 180 lb (81.647 kg) 143 lb 4.8 oz (65 kg)     Physical Exam: Blood pressure 107/57, pulse 82, temperature 98.2 F (36.8 C), temperature source Oral, resp. rate 17, height '6\' 4"'$  (1.93 m), weight 143 lb 4.8 oz (65 kg), SpO2 100 %. Body mass index is 17.45 kg/(m^2). General: Well developed, well nourished, in no acute  distress. Head: Normocephalic, atraumatic, sclera non-icteric, no xanthomas, nares are without discharge.  Neck: + carotid bruits L>R. JVD not elevated. Lungs: Clear bilaterally to auscultation without wheezes, rales, or rhonchi. Breathing is unlabored. Heart: RRR with S1 S2. No  murmurs, rubs, or gallops appreciated. Abdomen: Soft, non-tender, non-distended with normoactive bowel sounds. No hepatomegaly. No rebound/guarding. No obvious abdominal masses. Msk:  Strength and tone appear normal for age. Extremities: No clubbing or cyanosis. No edema. Right AKA. Faint left PT/DP pulse.   Neuro: Alert and oriented X 3. No facial asymmetry. No focal deficit. Moves all extremities spontaneously. Psych:  Responds to questions appropriately with a normal affect.    Assessment and Plan:  69 y.o. male with h/o CAD s/p CABG (LIMA to LAD and SVG to OM in 2012) and DES LCx and OM, PVD s/p right SFA stent s/p right toe amputation 2011 s/p right AKA 06/2014 secondary to gangrene of the foot, prior stroke, DM2, HTN, HLD, seizure disorder, history of right lung carcinoma s/p lobectomy, prostate cancer and underlying dementia who presented to [facility] on 8/29 with complaints of "just not feeling right."  1. Uneasy feeling: -Of uncertain etiology at this time -Consider arrhythmia, ischemia, and carotid dopplers  -Will schedule Lexiscan Myoview later this afternoon to evaluate for high risk ischemia (has eaten this morning) -Will check echo to evaluate LV function, wall motion, evaluate for valvular pathology, and right side pressure -Check carotid dopplers -Add Mg++ -Will monitor on tele while inpatient, if no arrhythmia is seen will need to evaluate as outpatient with event monitor   2. PVD s/p right SFA stent s/p right toe amputation s/p right AKA 06/2014: -Would be high risk cardiac cath -No symptoms currently  -Per Primary   3. History of stroke: -Continue aspirin  4. DM2: -A1C pending  5.  HTN: -Controlled  6. HLD: -Check fasting lipids -Lipitor 40 mg  7. Seizure disorder: -Per primary service  8. History of right lung carcinoma s/p lobectomy:  9. History of prostate cancer  Signed, Christell Faith, PA-C Pager: [phone] 06/24/2015, 9:02 AM

## 2015-06-24 NOTE — Progress Notes (Signed)
Back from nuclear medicine

## 2015-06-24 NOTE — H&P (Signed)
Stephen Camacho is an 69 y.o. male.   Chief Complaint: Chest pain HPI: Patient presents to the hospital via EMS complaining of chest pain. He states that he was at rest when his chest began to hurt midsternum. The pain lasted less than 10 minutes and was sharp in character. The pain resolved prior to pediatrician any medications. He denies associated nausea, vomiting or diaphoresis. Due to his comorbidities and history of coronary artery disease emergency department staff called for rule out of ischemic chest pain.  Past Medical History  Diagnosis Date  . Hyperlipidemia   . Hypertension   . Diabetes mellitus without complication   . CAD (coronary artery disease)   . PVD (peripheral vascular disease)   . Seizures   . Gangrene of foot   . Wheezing     Past Surgical History  Procedure Laterality Date  . Right aka  07-10-2014  . Left femoral popliteal bypass    . Right lung lobeectomy    . Coronary artery bypass graft      No family history on file. patient denies family history of heart disease, diabetes or hypertension Social History:  reports that he has never smoked. He does not have any smokeless tobacco history on file. He reports that he does not drink alcohol. His drug history is not on file.  Allergies: No Known Allergies  Medications Prior to Admission  Medication Sig Dispense Refill  . aspirin 81 MG tablet Take 81 mg by mouth daily.    Marland Kitchen atorvastatin (LIPITOR) 40 MG tablet Take 40 mg by mouth daily at 6 PM.    . insulin lispro (HUMALOG) 100 UNIT/ML injection Inject 6 Units into the skin 2 (two) times daily before a meal.    . isosorbide mononitrate (IMDUR) 60 MG 24 hr tablet Take 60 mg by mouth daily.    Marland Kitchen lisinopril (PRINIVIL,ZESTRIL) 10 MG tablet Take 10 mg by mouth daily.    . metFORMIN (GLUCOPHAGE) 500 MG tablet Take by mouth 2 (two) times daily with a meal.    . metoprolol (LOPRESSOR) 50 MG tablet Take 50 mg by mouth 2 (two) times daily.    Marland Kitchen oxyCODONE-acetaminophen  (PERCOCET/ROXICET) 5-325 MG per tablet Take one to two tablets by mouth every 6 hours as needed for pain 240 tablet 0    Results for orders placed or performed during the hospital encounter of 06/23/15 (from the past 48 hour(s))  Basic metabolic panel     Status: Abnormal   Collection Time: 06/23/15  8:09 PM  Result Value Ref Range   Sodium 133 (L) 135 - 145 mmol/L   Potassium 4.4 3.5 - 5.1 mmol/L   Chloride 101 101 - 111 mmol/L   CO2 21 (L) 22 - 32 mmol/L   Glucose, Bld 160 (H) 65 - 99 mg/dL   BUN 17 6 - 20 mg/dL   Creatinine, Ser 1.05 0.61 - 1.24 mg/dL   Calcium 9.1 8.9 - 10.3 mg/dL   GFR calc non Af Amer >60 >60 mL/min   GFR calc Af Amer >60 >60 mL/min    Comment: (NOTE) The eGFR has been calculated using the CKD EPI equation. This calculation has not been validated in all clinical situations. eGFR's persistently <60 mL/min signify possible Chronic Kidney Disease.    Anion gap 11 5 - 15  CBC     Status: Abnormal   Collection Time: 06/23/15  8:09 PM  Result Value Ref Range   WBC 7.9 3.8 - 10.6 K/uL   RBC  4.82 4.40 - 5.90 MIL/uL   Hemoglobin 11.2 (L) 13.0 - 18.0 g/dL   HCT 36.1 (L) 40.0 - 52.0 %   MCV 74.9 (L) 80.0 - 100.0 fL   MCH 23.2 (L) 26.0 - 34.0 pg   MCHC 31.0 (L) 32.0 - 36.0 g/dL   RDW 18.8 (H) 11.5 - 14.5 %   Platelets 227 150 - 440 K/uL  Troponin I     Status: None   Collection Time: 06/23/15  8:09 PM  Result Value Ref Range   Troponin I <0.03 <0.031 ng/mL    Comment:        NO INDICATION OF MYOCARDIAL INJURY.   CBC     Status: Abnormal   Collection Time: 06/24/15  3:24 AM  Result Value Ref Range   WBC 6.4 3.8 - 10.6 K/uL   RBC 4.12 (L) 4.40 - 5.90 MIL/uL   Hemoglobin 9.4 (L) 13.0 - 18.0 g/dL   HCT 30.3 (L) 40.0 - 52.0 %   MCV 73.7 (L) 80.0 - 100.0 fL   MCH 22.8 (L) 26.0 - 34.0 pg   MCHC 30.9 (L) 32.0 - 36.0 g/dL   RDW 18.4 (H) 11.5 - 14.5 %   Platelets 203 150 - 440 K/uL  Creatinine, serum     Status: None   Collection Time: 06/24/15  3:24 AM   Result Value Ref Range   Creatinine, Ser 1.01 0.61 - 1.24 mg/dL   GFR calc non Af Amer >60 >60 mL/min   GFR calc Af Amer >60 >60 mL/min    Comment: (NOTE) The eGFR has been calculated using the CKD EPI equation. This calculation has not been validated in all clinical situations. eGFR's persistently <60 mL/min signify possible Chronic Kidney Disease.   TSH     Status: None   Collection Time: 06/24/15  3:24 AM  Result Value Ref Range   TSH 2.166 0.350 - 4.500 uIU/mL  Troponin I     Status: None   Collection Time: 06/24/15  3:24 AM  Result Value Ref Range   Troponin I <0.03 <0.031 ng/mL    Comment:        NO INDICATION OF MYOCARDIAL INJURY.    Dg Chest 2 View  06/23/2015   CLINICAL DATA:  Left-sided chest pain and shortness of breath.  EXAM: CHEST  2 VIEW  COMPARISON:  07/08/2014  FINDINGS: The cardiac silhouette, mediastinal and hilar contours are within normal limits and stable. Stable surgical changes from bypass surgery. Stable emphysematous changes pulmonary scarring and surgical scarring changes. No acute overlying pulmonary process. Stable eventration of left hemidiaphragm there is a prominent skin fold across the left chest but no pneumothorax. Artifact over the last chest is likely EKG lead.  IMPRESSION: Chronic emphysematous and pulmonary scarring changes but no acute pulmonary findings.   Electronically Signed   By: Marijo Sanes M.D.   On: 06/23/2015 20:37   Ct Angio Chest Pe W/cm &/or Wo Cm  06/23/2015   CLINICAL DATA:  Intermittent left-sided chest pain, onset earlier today.  EXAM: CT ANGIOGRAPHY CHEST WITH CONTRAST  TECHNIQUE: Multidetector CT imaging of the chest was performed using the standard protocol during bolus administration of intravenous contrast. Multiplanar CT image reconstructions and MIPs were obtained to evaluate the vascular anatomy.  CONTRAST:  41m OMNIPAQUE IOHEXOL 350 MG/ML SOLN  COMPARISON:  None.  FINDINGS: Cardiovascular: There is good opacification of  the pulmonary arteries. There is no pulmonary embolism. The thoracic aorta is normal in caliber and intact. There is  prior coronary artery bypass grafting.  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 Linear scarring is present in the bases. No mass or significant nodule is evident.  Central airways: Patent  Effusions: None  Lymphadenopathy: None  Esophagus: Unremarkable  Upper abdomen: Low-attenuation lesions are visible in the upper poles of the incompletely imaged kidneys, probably cysts but not conclusively characterized.  Musculoskeletal: No significant musculoskeletal lesion.  Review of the MIP images confirms the above findings.  IMPRESSION: 1. Negative for acute pulmonary embolism 2. Severe emphysematous changes with prominent upper lobe bullae.   Electronically Signed   By: Andreas Newport M.D.   On: 06/23/2015 23:26    Review of Systems  Unable to perform ROS Constitutional: Negative for fever and chills.  HENT: Negative for sore throat and tinnitus.   Eyes: Negative for blurred vision and redness.  Respiratory: Negative for cough and shortness of breath.   Cardiovascular: Positive for chest pain (Now resolved). Negative for palpitations, orthopnea and PND.  Gastrointestinal: Negative for nausea, vomiting, abdominal pain and diarrhea.  Genitourinary: Negative for dysuria, urgency and frequency.  Musculoskeletal: Negative for myalgias and joint pain.  Skin: Negative for rash.       No lesions  Neurological: Negative for speech change, focal weakness and weakness.  Endo/Heme/Allergies: Does not bruise/bleed easily.       No temperature intolerance  Psychiatric/Behavioral: Negative for depression and suicidal ideas.    Blood pressure 106/57, pulse 94, temperature 98.1 F (36.7 C), temperature source Oral, resp. rate 19, height 6' 4"  (1.93 m), weight 81.647 kg (180 lb), SpO2 100 %. Physical Exam  Nursing note and vitals reviewed. Constitutional: He is oriented to  person, place, and time. He appears well-developed and well-nourished. No distress.  HENT:  Head: Normocephalic and atraumatic.  Mouth/Throat: Oropharynx is clear and moist.  Eyes: Conjunctivae and EOM are normal. Pupils are equal, round, and reactive to light. No scleral icterus.  Neck: Normal range of motion. Neck supple. No JVD present. No tracheal deviation present. No thyromegaly present.  Cardiovascular: Normal rate and regular rhythm.  Exam reveals no gallop and no friction rub.   No murmur heard. Respiratory: Effort normal and breath sounds normal. No respiratory distress.  GI: Soft. Bowel sounds are normal. He exhibits no distension. There is no tenderness.  Genitourinary:  Deferred  Musculoskeletal: Normal range of motion. He exhibits no edema.  Right BKA  Lymphadenopathy:    He has no cervical adenopathy.  Neurological: He is alert and oriented to person, place, and time. No cranial nerve deficit.  Skin: Skin is warm and dry. No erythema.  Vitiligo around the chin  Psychiatric: He has a normal mood and affect. His behavior is normal. Judgment and thought content normal.     Assessment/Plan This is a 69 year old African American male admitted for chest pain. 1. Chest pain: Unclear if ischemic at this time. The patient's troponin is negative and he has known new indications of endocardial ischemia on EKG. We'll continue to follow his cardiac enzymes were a cardiology consult for the morning. 2. Coronary artery disease: Continue aspirin and Imdur 3. Diabetes mellitus type 2: I have placed patient on sliding scale insulin hospitalized. Check hemoglobin A1c 4. Hypertension: Continue lisinopril and metoprolol 5. Hyperlipidemia: Continue statin therapy 6. DVT prophylaxis: Heparin 7. GI prophylaxis: None The patient is a full code. Time spent on admission orders and patient care approximately 35 minutes  Harrie Foreman 06/24/2015, 4:51 AM

## 2015-06-24 NOTE — Progress Notes (Signed)
To nuclear medicine via bed 

## 2015-06-24 NOTE — Progress Notes (Signed)
Pt discharged to home via wc.  Instructions  given to pt.  Questions answered.  No distress.  

## 2015-06-24 NOTE — Progress Notes (Signed)
Spoke with Dr. Rockey Situ, orders given to send pt home, follow up with clinic, [facility] office will call with appt time

## 2015-06-24 NOTE — Progress Notes (Signed)
[name]    MR#:  [id number]  DATE OF BIRTH:  12/12/1945  SUBJECTIVE:admitted for chest pain.,troponin negative.had lexiscan,waiting for  Results.  CHIEF COMPLAINT:   Chief Complaint  Patient presents with  . Chest Pain    REVIEW OF SYSTEMS:   ROS CONSTITUTIONAL: No fever, fatigue or weakness.  EYES: No blurred or double vision.  EARS, NOSE, AND THROAT: No tinnitus or ear pain.  RESPIRATORY: No cough, shortness of breath, wheezing or hemoptysis.  CARDIOVASCULAR: No chest pain, orthopnea, edema.  GASTROINTESTINAL: No nausea, vomiting, diarrhea or abdominal pain.  GENITOURINARY: No dysuria, hematuria.  ENDOCRINE: No polyuria, nocturia,  HEMATOLOGY: No anemia, easy bruising or bleeding SKIN: No rash or lesion. MUSCULOSKELETAL: No joint pain or arthritis.   NEUROLOGIC: No tingling, numbness, weakness.  PSYCHIATRY: No anxiety or depression.   DRUG ALLERGIES:  No Known Allergies  VITALS:  Blood pressure 151/77, pulse 77, temperature 98.6 F (37 C), temperature source Oral, resp. rate 18, height '6\' 4"'$  (1.93 m), weight 65 kg (143 lb 4.8 oz), SpO2 100 %.  PHYSICAL EXAMINATION:  GENERAL:  69 y.o.-year-old patient lying in the bed with no acute distress.  EYES: Pupils equal, round, reactive to light and accommodation. No scleral icterus. Extraocular muscles intact.  HEENT: Head atraumatic, normocephalic. Oropharynx and nasopharynx clear.  NECK:  Supple, no jugular venous distention. No thyroid enlargement, no tenderness.  LUNGS: Normal breath sounds bilaterally, no wheezing, rales,rhonchi or crepitation. No use of accessory muscles of respiration.  CARDIOVASCULAR: S1, S2 normal. No murmurs, rubs, or gallops.  ABDOMEN: Soft, nontender, nondistended. Bowel sounds present. No organomegaly or mass.  EXTREMITIES: No pedal edema, cyanosis, or clubbing.  NEUROLOGIC: Cranial nerves II through XII are  intact. Muscle strength 5/5 in all extremities. Sensation intact. Gait not checked.  PSYCHIATRIC: The patient is alert and oriented x 3.  SKIN: No obvious rash, lesion, or ulcer.    LABORATORY PANEL:   CBC  Recent Labs Lab 06/24/15 0324  WBC 6.4  HGB 9.4*  HCT 30.3*  PLT 203   ------------------------------------------------------------------------------------------------------------------  Chemistries   Recent Labs Lab 06/23/15 2009 06/24/15 0324 06/24/15 0910  NA 133*  --   --   K 4.4  --   --   CL 101  --   --   CO2 21*  --   --   GLUCOSE 160*  --   --   BUN 17  --   --   CREATININE 1.05 1.01  --   CALCIUM 9.1  --   --   MG  --   --  1.7   ------------------------------------------------------------------------------------------------------------------  Cardiac Enzymes  Recent Labs Lab 06/24/15 0910  TROPONINI <0.03   ------------------------------------------------------------------------------------------------------------------  RADIOLOGY:  Dg Chest 2 View  06/23/2015   CLINICAL DATA:  Left-sided chest pain and shortness of breath.  EXAM: CHEST  2 VIEW  COMPARISON:  07/08/2014  FINDINGS: The cardiac silhouette, mediastinal and hilar contours are within normal limits and stable. Stable surgical changes from bypass surgery. Stable emphysematous changes pulmonary scarring and surgical scarring changes. No acute overlying pulmonary process. Stable eventration of left hemidiaphragm there is a prominent skin fold across the left chest but no pneumothorax. Artifact over the last chest is likely EKG lead.  IMPRESSION: Chronic emphysematous and pulmonary scarring changes but no acute pulmonary findings.   Electronically Signed   By: Marijo Sanes M.D.   On: 06/23/2015 20:37   Ct  Angio Chest Pe W/cm &/or Wo Cm  06/23/2015   CLINICAL DATA:  Intermittent left-sided chest pain, onset earlier today.  EXAM: CT ANGIOGRAPHY CHEST WITH CONTRAST  TECHNIQUE: Multidetector CT  imaging of the chest was performed using the standard protocol during bolus administration of intravenous contrast. Multiplanar CT image reconstructions and MIPs were obtained to evaluate the vascular anatomy.  CONTRAST:  33m OMNIPAQUE IOHEXOL 350 MG/ML SOLN  COMPARISON:  None.  FINDINGS: Cardiovascular: There is good opacification of the pulmonary arteries. There is no pulmonary embolism. The thoracic aorta is normal in caliber and intact. There is prior coronary artery bypass grafting.  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 Linear scarring is present in the bases. No mass or significant nodule is evident.  Central airways: Patent  Effusions: None  Lymphadenopathy: None  Esophagus: Unremarkable  Upper abdomen: Low-attenuation lesions are visible in the upper poles of the incompletely imaged kidneys, probably cysts but not conclusively characterized.  Musculoskeletal: No significant musculoskeletal lesion.  Review of the MIP images confirms the above findings.  IMPRESSION: 1. Negative for acute pulmonary embolism 2. Severe emphysematous changes with prominent upper lobe bullae.   Electronically Signed   By: DAndreas NewportM.D.   On: 06/23/2015 23:26   UKoreaCarotid Bilateral  06/24/2015   CLINICAL DATA:  Visual disturbance, hyperlipidemia, vascular disease  EXAM: BILATERAL CAROTID DUPLEX ULTRASOUND  TECHNIQUE: GPearline Cablesscale imaging, color Doppler and duplex ultrasound were performed of bilateral carotid and vertebral arteries in the neck.  COMPARISON:  None.  FINDINGS: Criteria: Quantification of carotid stenosis is based on velocity parameters that correlate the residual internal carotid diameter with NASCET-based stenosis levels, using the diameter of the distal internal carotid lumen as the denominator for stenosis measurement.  The following velocity measurements were obtained:  RIGHT  ICA:  104/36 cm/sec  CCA:  1299/37cm/sec  SYSTOLIC ICA/CCA RATIO:  0.6  DIASTOLIC ICA/CCA RATIO:  2.2   ECA:  134 cm/sec  LEFT  ICA:  116/40 80 cm/sec  CCA:  916/96cm/sec  SYSTOLIC ICA/CCA RATIO:  1.3  DIASTOLIC ICA/CCA RATIO:  2.0  ECA:  183 cm/sec  RIGHT CAROTID ARTERY: Mixed heterogeneous hypoechoic plaque formation with areas of calcification. This narrows the lumen by grayscale imaging. Despite this, there is no hemodynamically significant right ICA stenosis, velocity elevation, or turbulent flow. Degree of narrowing remains less than 50%.  RIGHT VERTEBRAL ARTERY:  Antegrade  LEFT CAROTID ARTERY: Similar mixed moderate heterogeneous plaque formation with areas of echogenic shadowing calcification. This narrows the left ICA lumen by grayscale imaging. Despite this, there is no hemodynamically significant left ICA stenosis, velocity elevation, or turbulent flow. Degree of narrowing remains less than 50%.  LEFT VERTEBRAL ARTERY:  Antegrade  IMPRESSION: Moderate bilateral carotid atherosclerosis but no hemodynamically significant ICA stenosis. Degree of narrowing less than 50% bilaterally.  Patent antegrade vertebral flow bilaterally.   Electronically Signed   By: MJerilynn Mages  Shick M.D.   On: 06/24/2015 16:08    EKG:   Orders placed or performed during the hospital encounter of 06/23/15  . ED EKG within 10 minutes  . ED EKG within 10 minutes  . EKG 12-Lead  . EKG 12-Lead    ASSESSMENT AND PLAN:  1.Chest pain:with negative troponin,but because of h/o CAD, PAD seen by cardio,recomended LExiscan,Carotid ultrasound;carotid sono shows moderate stenosis;waiting for lexiscan results,if negative, will  D/c home. revised home  meds and restarted.      All the records are reviewed and case  discussed with Care Management/Social Workerr. Management plans discussed with the patient, family and they are in agreement.  CODE STATUS: full  TOTAL TIME TAKING CARE OF THIS PATIENT:75mnutes.   POSSIBLE D/C IN1 DAYS, DEPENDING ON CLINICAL CONDITION.   KEpifanio LeschesM.D on 06/24/2015 at 5:42 PM  Between 7am to  6pm - Pager - [phone]  After 6pm go to [email or web address] - password [email or web address]  Office  [phone] CC: Primary care physician; Pcp Not In System

## 2015-06-24 NOTE — Progress Notes (Signed)
*  PRELIMINARY RESULTS* Echocardiogram 2D Echocardiogram has been performed.  Stephen Camacho 06/24/2015, 4:26 PM

## 2015-06-24 NOTE — Care Management (Signed)
Order present for CM assessment for discharge planning.  Patient admitted with chest pain.  Troponins are negative .  Stress test is in progress.  Patient is off the unit. spoke with primary nurse and informed that patient very active and no immediate concerns identified.

## 2015-06-28 NOTE — Discharge Summary (Signed)
Stephen Camacho, is a 69 y.o. male  DOB December 13, 1945  MRN [id number].  Admission date:  06/23/2015  Admitting Physician  Harrie Foreman, MD  Discharge Date:  06/24/2015   Primary MD  Pcp Not In System  Recommendations for primary care physician for things to follow:   Follow-up with primary doctor in 1 week.   Admission Diagnosis  Chest pain, unspecified chest pain type [R07.9]   Discharge Diagnosis  Chest pain, unspecified chest pain type [R07.9]    Active Problems:   Chest pain      Past Medical History  Diagnosis Date  . Hyperlipidemia   . Hypertension   . Diabetes mellitus   . CAD (coronary artery disease)     a. s/p 2 v CABG in 2012 (LIMA-LAD & SVG-OM); b. cath 07/2012 s/p PCI/DES to ostial LCx and OM  . PAD (peripheral artery disease)     a. s/p right SFA stent; b. s/p right toe amputation 2011; c. s/p right AKA spring 2016  . Seizures   . Gangrene of foot   . Wheezing   . Lung cancer   . Prostate cancer   . Stroke     Past Surgical History  Procedure Laterality Date  . Right aka  07-10-2014  . Left femoral popliteal bypass    . Right lung lobeectomy    . Coronary artery bypass graft         History of present illness and  Hospital Course:     Kindly see H&P for history of present illness and admission details, please review complete Labs, Consult reports and Test reports for all details in brief  HPI  from the history and physical done on the day of admission 69 year old male patient with the history of hypertension, hyperlipidemia, diabetes, coronary artery disease, peripheral vascular disease, seizures, gangrene of the foot came in because of chest pain.  Hospital Course    #1 chest pain and monitored on telemetry, troponins are negative 3. Seen by cardiology secondary to his history  of for coronary artery disease, peripheral vascular disease. Patient did have a Lexiscan stress test, showed EF of 43%, low risk scan, no evidence of ischemia. Not have any arrhythmias on telemetry. Patient also had carotid ultrasound because of history of peripheral vascular disease, showed bilateral carotid artery disease but the no evidence of hemodynamically significant stenosis,; s advised to continue aspirin, statins, Imdur, lisinopril. Metoprolol. Advised to follow-up with cardiology for outpatient loop monitoring.  #2 history of diabetes mellitus type 2 best to continue metformin. #3 history of for peripheral vascular disease: And history of CAD status post CABG. Patient does take Imdur, aspirin, statins, Xarelto, metoprolol 50 mg by mouth twice a day advised to continue that.  Discharge Condition: Stable   Follow UP      Discharge Instructions  and  Discharge Medications        Medication List    STOP taking these medications        insulin lispro 100 UNIT/ML injection  Commonly known as:  HUMALOG      TAKE these medications        aspirin 81 MG tablet  Take 81 mg by mouth daily.     atorvastatin 40 MG tablet  Commonly known as:  LIPITOR  Take 40 mg by mouth daily at 6 PM.     cilostazol 50 MG tablet  Commonly known as:  PLETAL  Take 50 mg by mouth 2 (two) times daily.  insulin glargine 100 UNIT/ML injection  Commonly known as:  LANTUS  Inject 10 Units into the skin at bedtime.     isosorbide mononitrate 60 MG 24 hr tablet  Commonly known as:  IMDUR  Take 60 mg by mouth daily.     lisinopril 10 MG tablet  Commonly known as:  PRINIVIL,ZESTRIL  Take 10 mg by mouth daily.     metFORMIN 500 MG tablet  Commonly known as:  GLUCOPHAGE  Take by mouth 2 (two) times daily with a meal.     oxyCODONE-acetaminophen 5-325 MG per tablet  Commonly known as:  PERCOCET/ROXICET  Take one to two tablets by mouth every 6 hours as needed for pain     rivaroxaban 20  MG Tabs tablet  Commonly known as:  XARELTO  Take 20 mg by mouth daily with supper.          Diet and Activity recommendation: See Discharge Instructions above   Consults obtained - cardiology  Major procedures and Radiology Reports - PLEASE review detailed and final reports for all details, in brief -      Dg Chest 2 View  06/23/2015   CLINICAL DATA:  Left-sided chest pain and shortness of breath.  EXAM: CHEST  2 VIEW  COMPARISON:  07/08/2014  FINDINGS: The cardiac silhouette, mediastinal and hilar contours are within normal limits and stable. Stable surgical changes from bypass surgery. Stable emphysematous changes pulmonary scarring and surgical scarring changes. No acute overlying pulmonary process. Stable eventration of left hemidiaphragm there is a prominent skin fold across the left chest but no pneumothorax. Artifact over the last chest is likely EKG lead.  IMPRESSION: Chronic emphysematous and pulmonary scarring changes but no acute pulmonary findings.   Electronically Signed   By: Marijo Sanes M.D.   On: 06/23/2015 20:37   Ct Angio Chest Pe W/cm &/or Wo Cm  06/23/2015   CLINICAL DATA:  Intermittent left-sided chest pain, onset earlier today.  EXAM: CT ANGIOGRAPHY CHEST WITH CONTRAST  TECHNIQUE: Multidetector CT imaging of the chest was performed using the standard protocol during bolus administration of intravenous contrast. Multiplanar CT image reconstructions and MIPs were obtained to evaluate the vascular anatomy.  CONTRAST:  46m OMNIPAQUE IOHEXOL 350 MG/ML SOLN  COMPARISON:  None.  FINDINGS: Cardiovascular: There is good opacification of the pulmonary arteries. There is no pulmonary embolism. The thoracic aorta is normal in caliber and intact. There is prior coronary artery bypass grafting.  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 Linear scarring is present in the bases. No mass or significant nodule is evident.  Central airways: Patent  Effusions: None   Lymphadenopathy: None  Esophagus: Unremarkable  Upper abdomen: Low-attenuation lesions are visible in the upper poles of the incompletely imaged kidneys, probably cysts but not conclusively characterized.  Musculoskeletal: No significant musculoskeletal lesion.  Review of the MIP images confirms the above findings.  IMPRESSION: 1. Negative for acute pulmonary embolism 2. Severe emphysematous changes with prominent upper lobe bullae.   Electronically Signed   By: DAndreas NewportM.D.   On: 06/23/2015 23:26   UKoreaCarotid Bilateral  06/24/2015   CLINICAL DATA:  Visual disturbance, hyperlipidemia, vascular disease  EXAM: BILATERAL CAROTID DUPLEX ULTRASOUND  TECHNIQUE: GPearline Cablesscale imaging, color Doppler and duplex ultrasound were performed of bilateral carotid and vertebral arteries in the neck.  COMPARISON:  None.  FINDINGS: Criteria: Quantification of carotid stenosis is based on velocity parameters that correlate the residual internal carotid diameter with NASCET-based stenosis  levels, using the diameter of the distal internal carotid lumen as the denominator for stenosis measurement.  The following velocity measurements were obtained:  RIGHT  ICA:  104/36 cm/sec  CCA:  546/27 cm/sec  SYSTOLIC ICA/CCA RATIO:  0.6  DIASTOLIC ICA/CCA RATIO:  2.2  ECA:  134 cm/sec  LEFT  ICA:  116/40 80 cm/sec  CCA:  03/50 cm/sec  SYSTOLIC ICA/CCA RATIO:  1.3  DIASTOLIC ICA/CCA RATIO:  2.0  ECA:  183 cm/sec  RIGHT CAROTID ARTERY: Mixed heterogeneous hypoechoic plaque formation with areas of calcification. This narrows the lumen by grayscale imaging. Despite this, there is no hemodynamically significant right ICA stenosis, velocity elevation, or turbulent flow. Degree of narrowing remains less than 50%.  RIGHT VERTEBRAL ARTERY:  Antegrade  LEFT CAROTID ARTERY: Similar mixed moderate heterogeneous plaque formation with areas of echogenic shadowing calcification. This narrows the left ICA lumen by grayscale imaging. Despite this, there  is no hemodynamically significant left ICA stenosis, velocity elevation, or turbulent flow. Degree of narrowing remains less than 50%.  LEFT VERTEBRAL ARTERY:  Antegrade  IMPRESSION: Moderate bilateral carotid atherosclerosis but no hemodynamically significant ICA stenosis. Degree of narrowing less than 50% bilaterally.  Patent antegrade vertebral flow bilaterally.   Electronically Signed   By: Jerilynn Mages.  Shick M.D.   On: 06/24/2015 16:08   Nm Myocar Multi W/spect W/wall Motion / Ef  06/24/2015   Pharmacological myocardial perfusion imaging study with no significant   Ischemia Non-attenuation corrected images were used secondary to artifact on  attenuation corrected  Images. Normal wall motion, EF estimated at 43% (depressed EF possibly  from  artifact) No EKG changes concerning for ischemia.  Low risk scan  Signed, Esmond Plants MD    Micro Results     No results found for this or any previous visit (from the past 240 hour(s)).     Today   Subjective:   Stephen Camacho today has no headache,no chest abdominal pain,no new weakness tingling or numbness, feels much better wants to go home today.   Objective:   Blood pressure 151/77, pulse 77, temperature 98.6 F (37 C), temperature source Oral, resp. rate 18, height '6\' 4"'$  (1.93 m), weight 65 kg (143 lb 4.8 oz), SpO2 100 %.  No intake or output data in the 24 hours ending 06/28/15 1219  Exam Awake Alert, Oriented x 3, No new F.N deficits, Normal affect [place].AT,PERRAL Supple Neck,No JVD, No cervical lymphadenopathy appriciated.  Symmetrical Chest wall movement, Good air movement bilaterally, CTAB RRR,No Gallops,Rubs or new Murmurs, No Parasternal Heave +ve B.Sounds, Abd Soft, Non tender, No organomegaly appriciated, No rebound -guarding or rigidity. No Cyanosis, Clubbing or edema, No new Rash or bruise  Data Review   CBC w Diff:  Lab Results  Component Value Date   WBC 6.4 06/24/2015   WBC 4.9 10/25/2014   HGB 9.4* 06/24/2015   HGB  12.1* 10/25/2014   HCT 30.3* 06/24/2015   HCT 38.1* 10/25/2014   PLT 203 06/24/2015   PLT 151 10/25/2014   LYMPHOPCT 31.6 10/25/2014   MONOPCT 7.2 10/25/2014   EOSPCT 3.9 10/25/2014   BASOPCT 0.8 10/25/2014    CMP:  Lab Results  Component Value Date   NA 133* 06/23/2015   NA 138 10/25/2014   K 4.4 06/23/2015   K 3.9 10/25/2014   CL 101 06/23/2015   CL 109* 10/25/2014   CO2 21* 06/23/2015   CO2 21 10/25/2014   BUN 17 06/23/2015   BUN 17 10/25/2014   CREATININE  1.01 06/24/2015   CREATININE 1.11 10/25/2014  .   Total Time in preparing paper work, data evaluation and todays exam - 52 minutes  Maddie Brazier M.D on 06/24/2015 at 12:19 PM

## 2015-07-21 ENCOUNTER — Encounter: Payer: Self-pay | Admitting: Physical Therapy

## 2015-07-21 NOTE — Therapy (Signed)
[facility] [address] [place], [place], [address] Phone: [phone]   Fax:  [phone]  Patient Details  Name: Stephen Camacho MRN: [id number] Date of Birth: Mar 14, 1946 Referring Provider:  No ref. provider found  Encounter Date: 07/21/2015   PHYSICAL THERAPY DISCHARGE SUMMARY  Visits from Start of Care: 7  Current functional level related to goals / functional outcomes: This patient had transportation issues and did not return for further PT. Status at time of discharge is not known as patient not present.     PT Long Term Goals - 12/18/14 1115    PT LONG TERM GOAL #1   Title Patient demonstrates / verbalizes proper prosthetic care. (Target Date: 02/14/15)   Time 8   Period Weeks   Status New   PT LONG TERM GOAL #2   Title Patient tolerates wear >90% of awake hours without change in skin integrity or tenderness. (Target Date: 02/14/15)   Time 8   Period Weeks   Status New   PT LONG TERM GOAL #3   Title Patient ambulates 400' with LRAD and prosthesis modified indpendently. (Target Date: 02/14/15)   Time 8   Period Weeks   Status New   PT LONG TERM GOAL #4   Title Patient negotiates ramp, curb and stairs with LRAD & prosthesis modified independent. (Target Date: 02/14/15)   Time 8   Period Weeks   Status New   PT LONG TERM GOAL #5   Title Berg Balance >/= 20/56 (Target Date: 02/14/15)   Time 8   Period Weeks   Status New       Remaining deficits: At last appointment: Patient verbalized a general understanding of prosthetic care including cleaning and donning but needed further instruction for adjusting ply socks and managing sweat. Patient ambulated with rolling walker & prosthesis with supervision / cues to manage technique. Patient required minimal assist on ramps & supervision on curbs.   Education / Equipment: Patient was instructed in prosthetic care & general use with rolling  walker. Plan: Patient not available to agree or disagree with discharge.  Patient goals were not met. Patient is being discharged due to not returning since the last visit.  ?????       WALDRON,ROBIN PT, DPT 07/21/2015, 8:41 AM  [facility] [address] [place], [place], [address] Phone: [phone]   Fax:  [phone]

## 2015-08-12 ENCOUNTER — Ambulatory Visit: Payer: PPO | Attending: Behavioral Health | Admitting: Physical Therapy

## 2015-08-12 ENCOUNTER — Encounter: Payer: Self-pay | Admitting: Physical Therapy

## 2015-08-12 DIAGNOSIS — Z7409 Other reduced mobility: Secondary | ICD-10-CM | POA: Diagnosis present

## 2015-08-12 DIAGNOSIS — R29818 Other symptoms and signs involving the nervous system: Secondary | ICD-10-CM | POA: Diagnosis present

## 2015-08-12 DIAGNOSIS — R2689 Other abnormalities of gait and mobility: Secondary | ICD-10-CM

## 2015-08-12 DIAGNOSIS — R269 Unspecified abnormalities of gait and mobility: Secondary | ICD-10-CM | POA: Diagnosis not present

## 2015-08-12 DIAGNOSIS — Z89611 Acquired absence of right leg above knee: Secondary | ICD-10-CM | POA: Diagnosis present

## 2015-08-12 DIAGNOSIS — R2681 Unsteadiness on feet: Secondary | ICD-10-CM | POA: Diagnosis present

## 2015-08-12 NOTE — Therapy (Signed)
[facility] [address] [place], [place], [address] Phone: [phone]   Fax:  [phone]  Physical Therapy Evaluation  Patient Details  Name: Stephen Camacho MRN: [id number] Date of Birth: Aug 16, 1946 Referring Provider: Blenda Nicely, MD  Encounter Date: 08/12/2015      PT End of Session - 08/12/15 0800    Visit Number 1   Number of Visits 18   Date for PT Re-Evaluation 10/10/15   Authorization Type Medicare G-code and progress report every 10 visits.   PT Start Time 0800   PT Stop Time 0845   PT Time Calculation (min) 45 min   Equipment Utilized During Treatment Gait belt   Activity Tolerance Patient tolerated treatment well   Behavior During Therapy WFL for tasks assessed/performed      Past Medical History  Diagnosis Date  . Hyperlipidemia   . Hypertension   . Diabetes mellitus ([place])   . CAD (coronary artery disease)     a. s/p 2 v CABG in 2012 (LIMA-LAD & SVG-OM); b. cath 07/2012 s/p PCI/DES to ostial LCx and OM  . PAD (peripheral artery disease) ([place])     a. s/p right SFA stent; b. s/p right toe amputation 2011; c. s/p right AKA spring 2016  . Seizures ([place])   . Gangrene of foot ([place])   . Wheezing   . Lung cancer ([place])   . Prostate cancer ([place])   . Stroke [facility])     Past Surgical History  Procedure Laterality Date  . Right aka  07-10-2014  . Left femoral popliteal bypass    . Right lung lobeectomy    . Coronary artery bypass graft      There were no vitals filed for this visit.  Visit Diagnosis:  Abnormality of gait  Balance problems  Impaired functional mobility and activity tolerance  Status post above knee amputation of right lower extremity (HCC)  Unsteadiness      Subjective Assessment - 08/12/15 0807    Subjective This 69yo male underwent a right Transfemoral Amputation on 07/10/2014. He recieved his first prosthesis mid-February 2016. Patient was seeing this PT for  prosthetic training and had transportation issues with >40 minutes commute. He is having diifficulty walking with his prosthesis and was referred back to PT for evaluation.    Patient is accompained by: Family member   Patient Stated Goals To Walk without walker   Currently in Pain? No/denies            [facility] PT Assessment - 08/12/15 0800    Assessment   Medical Diagnosis Right Transfemoral Amputation   Referring Provider Blenda Nicely, MD   Precautions   Precautions Fall   Restrictions   Weight Bearing Restrictions No   Balance Screen   Has the patient fallen in the past 6 months No   Has the patient had a decrease in activity level because of a fear of falling?  No   Is the patient reluctant to leave their home because of a fear of falling?  No   Home Environment   Living Environment Private residence   Living Arrangements Alone   Type of [place] to enter;Level entry  no steps at back (primary), 1 step at front   Entrance Stairs-Number of Steps 1   Entrance Stairs-Rails None   Home Layout One level   Prior Function   Level of Independence Independent;Independent with household mobility without device;Independent with community mobility without device;Independent  with gait  full community distances   Vocation Retired   Observation/Other Assessments   Focus on Therapeutic Outcomes (FOTO)  51 Functional Status   Fear Avoidance Belief Questionnaire (FABQ)  61   Posture/Postural Control   Posture/Postural Control Postural limitations   Postural Limitations Forward head;Weight shift left  left knee flexed as prosthesis too short   ROM / Strength   AROM / PROM / Strength AROM;Strength   AROM   Overall AROM Comments Right hip extension WFL   Strength   Overall Strength Within functional limits for tasks performed   Overall Strength Comments Gross testing in sitting 5/5 all extremities   Transfers   Transfers Sit to Stand;Stand to Sit   Sit to  Stand 6: Modified independent (Device/Increase time);With upper extremity assist;With armrests;From chair/3-in-1   Stand to Sit 6: Modified independent (Device/Increase time);With upper extremity assist;With armrests;To chair/3-in-1   Ambulation/Gait   Ambulation/Gait Yes   Ambulation/Gait Assistance 4: Min assist;6: Modified independent (Device/Increase time)  MinA with cane, Modified independent with RW   Ambulation/Gait Assistance Details Current prosthetic knee is K2 SAFE that limits ability to vary cadence. Patient's gait is slowed and limited by ability to advance knee in timely manner.   Ambulation Distance (Feet) 75 Feet   Assistive device Prosthesis;Straight cane;Rolling walker  arrived using RW, assessed with cane during eval   Gait Pattern Step-to pattern;Decreased stance time - right;Decreased stride length;Decreased step length - left;Decreased hip/knee flexion - right;Right steppage;Decreased trunk rotation;Trunk flexed;Abducted- right   Ambulation Surface Indoor;Level   Gait velocity 0.71 ft/sec   Stairs Yes   Stairs Assistance 5: Supervision   Stair Management Technique One rail Right;With cane;Step to pattern;Forwards   Number of Stairs 4   Ramp 3: Mod assist  single point cane & prosthesis   Curb 3: Mod assist  single point cane & prosthesis   Standardized Balance Assessment   Standardized Balance Assessment Berg Balance Test;Timed Up and Go Test   Berg Balance Test   Sit to Stand Able to stand  independently using hands   Standing Unsupported Able to stand safely 2 minutes   Sitting with Back Unsupported but Feet Supported on Floor or Stool Able to sit safely and securely 2 minutes   Stand to Sit Controls descent by using hands   Transfers Able to transfer safely, minor use of hands   Standing Unsupported with Eyes Closed Able to stand 10 seconds with supervision   Standing Ubsupported with Feet Together Able to place feet together independently and stand for 1 minute  with supervision   From Standing, Reach Forward with Outstretched Arm Can reach forward >12 cm safely (5")   From Standing Position, Pick up Object from Floor Able to pick up shoe, needs supervision   From Standing Position, Turn to Look Behind Over each Shoulder Looks behind one side only/other side shows less weight shift   Turn 360 Degrees Needs close supervision or verbal cueing   Standing Unsupported, Alternately Place Feet on Step/Stool Able to complete >2 steps/needs minimal assist   Standing Unsupported, One Foot in Front Able to take small step independently and hold 30 seconds   Standing on One Leg Tries to lift leg/unable to hold 3 seconds but remains standing independently   Total Score 38   Timed Up and Go Test   Normal TUG (seconds) 49.91  single point cane & prosthesis         Prosthetics Assessment - 08/12/15 0800    Prosthetics  Prosthetic Care Independent with Skin check;Residual limb care;Prosthetic cleaning;Correct ply sock adjustment;Proper wear schedule/adjustment   Donning prosthesis  Modified independent (Device/Increase time)   Doffing prosthesis  Modified independent (Device/Increase time)   Current prosthetic wear tolerance (days/week)  7 days   Current prosthetic wear tolerance (#hours/day)  all awake hours.   Current prosthetic weight-bearing tolerance (hours/day)  tolerated 20 minutes of standing & gait without issues   Residual limb condition  no open areas, normal color, moisture, hair growth and temperature   K code/activity level with prosthetic use  potential for K3 (full community with variable cadence) with appropriate knee unit. Patient's socket is too large due to typical residual limb shrinkage with use of first prosthesis. His knee is single axis friction engaging which limits his ability to vary cadence. His gait is slowed by knee unit.                            PT Short Term Goals - 08/12/15 0845    PT SHORT TERM GOAL #1    Title Patient ambulates 200' with prosthesis and single point cane with supervision. (Target Date: 09/11/2015)   Time 1   Period Months   Status New   PT SHORT TERM GOAL #2   Title Patient negotiates ramps & curbs with single point cane & prosthesis with supervision. . (Target Date: 09/11/2015)   Time 1   Period Months   Status New   PT SHORT TERM GOAL #3   Title Patient ambulates 53' with prosthesis only with minimal assist. . (Target Date: 09/11/2015)   Time 1   Period Months   Status New           PT Long Term Goals - 08/12/15 0845    PT LONG TERM GOAL #1   Title Patient demonstrates / verbalizes proper prosthetic care of new prosthesis & componentry. (Target Date: 10/10/2015)   Time 2   Period Months   Status New   PT LONG TERM GOAL #2   Title Patient tolerates wear of new prosthesis >90% of awake hours without skin issues or limb tenderness.  (Target Date: 10/10/2015)   Time 2   Period Months   Status New   PT LONG TERM GOAL #3   Title Patient ambulates 500' with single point cane & prosthesis modified independent.  (Target Date: 10/10/2015)   Time 2   Period Months   Status New   PT LONG TERM GOAL #4   Title Patient negotiates ramps, curbs & stairs (1 rail) with single point cane & prosthesis modified independent.  (Target Date: 10/10/2015)   Time 2   Period Months   Status New   PT LONG TERM GOAL #5   Title Patient ambulates 100' carrying laundry basket around furniture with prosthesis only modified indpendent to enable household mobility.  (Target Date: 10/10/2015)   Time 2   Period Months   Status New   Additional Long Term Goals   Additional Long Term Goals Yes   PT LONG TERM GOAL #6   Title Berg Balance > 45/56 to lower fall risk.  (Target Date: 10/10/2015)   Time 2   Period Months   Status New   PT LONG TERM GOAL #7   Title Timed Up & Go <30 seconds with prosthesis only.  (Target Date: 10/10/2015)   Time 2   Period Months   Status New  Plan - 08/25/15 0800    Clinical Impression Statement This 69yo male with Transfemoral Amputation has potential to ambulate at full community level with variable cadence with appropriate knee unit and use of a single point cane with training. He has potential to ambulate in home situation with prosthesis only, no device. His prosthetic socket is too large due to residual limb volume changes typical with first prosthesis. He needs a socket revision. PT would recommend switching suspension to silicon liner with suction ring. His gait velocity of 0.71 ft/sec which is limited by current knee also indicates fall risk and limited community mobility. Timed Up & Go of 49.91sec and Berg Balance 38 / 56 both indicate fall risk. Patient needs skilled PT to progess gait to potential.   Pt will benefit from skilled therapeutic intervention in order to improve on the following deficits Abnormal gait;Decreased activity tolerance;Decreased balance;Decreased mobility;Prosthetic Dependency   Rehab Potential Good   PT Frequency 2x / week  patient to start 1x/wk until recieves new prosthesis   PT Duration Other (comment)  9 weeks (60 days)   PT Treatment/Interventions ADLs/Self Care Home Management;Gait training;Stair training;Patient/family education;Functional mobility training;Therapeutic activities;Therapeutic exercise;Balance training;Neuromuscular re-education;Prosthetic Training   PT Next Visit Plan gait with single point cane & prosthesis including barriers   Recommended Other Services new prosthesis K3 level   Consulted and Agree with Plan of Care Patient;Family member/caregiver   Family Member Consulted dtr-law          G-Codes - 08/25/2015 0845    Functional Assessment Tool Used Merrilee Jansky Balance 38/56; Timed Up & Go with single point cane & prosthesis with minimal assist 49.91sec   Functional Limitation Mobility: Walking and moving around   Mobility: Walking and Moving Around Current  Status [phone]) At least 60 percent but less than 80 percent impaired, limited or restricted   Mobility: Walking and Moving Around Goal Status [phone]) At least 20 percent but less than 40 percent impaired, limited or restricted       Problem List Patient Active Problem List   Diagnosis Date Noted  . Chest pain 06/24/2015  . 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 Gangrene ([facility]) 07/22/2014  . PVD (peripheral vascular disease) ([place]) 07/21/2014  . PAD (peripheral artery disease) ([place]) 07/21/2014  . S/P AKA (above knee amputation) ([place]) 07/21/2014  . CAD (coronary artery disease) 07/21/2014  . Dyslipidemia 07/21/2014  . Essential hypertension, benign 07/21/2014    Jamey Reas PT, DPT 08-25-15, 9:31 PM  [facility] [address] [place], [place], [address] Phone: [phone]   Fax:  [phone]  Name: Stephen Camacho MRN: [id number] Date of Birth: 1946-03-25

## 2015-08-19 ENCOUNTER — Encounter: Payer: Self-pay | Admitting: Physical Therapy

## 2015-08-19 ENCOUNTER — Ambulatory Visit: Payer: PPO | Admitting: Physical Therapy

## 2015-08-19 DIAGNOSIS — R2689 Other abnormalities of gait and mobility: Secondary | ICD-10-CM

## 2015-08-19 DIAGNOSIS — Z89611 Acquired absence of right leg above knee: Secondary | ICD-10-CM

## 2015-08-19 DIAGNOSIS — R2681 Unsteadiness on feet: Secondary | ICD-10-CM

## 2015-08-19 DIAGNOSIS — R269 Unspecified abnormalities of gait and mobility: Secondary | ICD-10-CM

## 2015-08-19 DIAGNOSIS — Z7409 Other reduced mobility: Secondary | ICD-10-CM

## 2015-08-19 NOTE — Therapy (Signed)
[facility] [address] [place], [place], [address] Phone: [phone]   Fax:  [phone]  Physical Therapy Treatment  Patient Details  Name: Stephen Camacho MRN: [id number] Date of Birth: 07/04/1946 Referring Provider: Blenda Nicely, MD  Encounter Date: 08/19/2015      PT End of Session - 08/19/15 0930    Visit Number 2   Number of Visits 18   Date for PT Re-Evaluation 10/10/15   Authorization Type Medicare G-code and progress report every 10 visits.   PT Start Time 0930   PT Stop Time 1015   PT Time Calculation (min) 45 min   Equipment Utilized During Treatment Gait belt   Activity Tolerance Patient tolerated treatment well   Behavior During Therapy WFL for tasks assessed/performed      Past Medical History  Diagnosis Date  . Hyperlipidemia   . Hypertension   . Diabetes mellitus ([place])   . CAD (coronary artery disease)     a. s/p 2 v CABG in 2012 (LIMA-LAD & SVG-OM); b. cath 07/2012 s/p PCI/DES to ostial LCx and OM  . PAD (peripheral artery disease) ([place])     a. s/p right SFA stent; b. s/p right toe amputation 2011; c. s/p right AKA spring 2016  . Seizures ([place])   . Gangrene of foot ([place])   . Wheezing   . Lung cancer ([place])   . Prostate cancer ([place])   . Stroke [facility])     Past Surgical History  Procedure Laterality Date  . Right aka  07-10-2014  . Left femoral popliteal bypass    . Right lung lobeectomy    . Coronary artery bypass graft      There were no vitals filed for this visit.  Visit Diagnosis:  Abnormality of gait  Balance problems  Impaired functional mobility and activity tolerance  Status post above knee amputation of right lower extremity (HCC)  Unsteadiness      Subjective Assessment - 08/19/15 0930    Subjective He saw prosthetist who raised prosthesis ht as PT recommended and is proceding with new prosthesis including new socket & knee.    Patient is accompained by:  Family member   Currently in Pain? No/denies     Prosthetic Training:  PT demo & instructed prosthetic knee flexion in terminal stance to improve prosthesis clearance in swing with minimal deviations (increased need for UE support on RW). Patient ambulated with RW & prosthesis with tactile & verbal cues for prosthetic knee flexion in terminal stance, initial contact with heel without excessive lift of foot and wt shift over prosthesis in stance for step thru pattern. Patient ambulated along counter and table for right UE support with cane use in left UE with tactile & verbal cues for gait above. Patient & dtr-law verbalize understanding how to work on gait with right UE support.                             PT Education - 08/19/15 0930    Education provided Yes   Education Details If elgible for Medicaid in addtion to Medicare, he should get full coverage of new prosthesis. PT recommended apply for Medicaid if elgible.    Person(s) Educated Patient;Child(ren)   Methods Explanation   Comprehension Verbalized understanding          PT Short Term Goals - 08/12/15 0845    PT SHORT TERM GOAL #1   Title Patient  ambulates 200' with prosthesis and single point cane with supervision. (Target Date: 09/11/2015)   Time 1   Period Months   Status New   PT SHORT TERM GOAL #2   Title Patient negotiates ramps & curbs with single point cane & prosthesis with supervision. . (Target Date: 09/11/2015)   Time 1   Period Months   Status New   PT SHORT TERM GOAL #3   Title Patient ambulates 61' with prosthesis only with minimal assist. . (Target Date: 09/11/2015)   Time 1   Period Months   Status New           PT Long Term Goals - 08/12/15 0845    PT LONG TERM GOAL #1   Title Patient demonstrates / verbalizes proper prosthetic care of new prosthesis & componentry. (Target Date: 10/10/2015)   Time 2   Period Months   Status New   PT LONG TERM GOAL #2   Title Patient  tolerates wear of new prosthesis >90% of awake hours without skin issues or limb tenderness.  (Target Date: 10/10/2015)   Time 2   Period Months   Status New   PT LONG TERM GOAL #3   Title Patient ambulates 500' with single point cane & prosthesis modified independent.  (Target Date: 10/10/2015)   Time 2   Period Months   Status New   PT LONG TERM GOAL #4   Title Patient negotiates ramps, curbs & stairs (1 rail) with single point cane & prosthesis modified independent.  (Target Date: 10/10/2015)   Time 2   Period Months   Status New   PT LONG TERM GOAL #5   Title Patient ambulates 100' carrying laundry basket around furniture with prosthesis only modified indpendent to enable household mobility.  (Target Date: 10/10/2015)   Time 2   Period Months   Status New   Additional Long Term Goals   Additional Long Term Goals Yes   PT LONG TERM GOAL #6   Title Berg Balance > 45/56 to lower fall risk.  (Target Date: 10/10/2015)   Time 2   Period Months   Status New   PT LONG TERM GOAL #7   Title Timed Up & Go <30 seconds with prosthesis only.  (Target Date: 10/10/2015)   Time 2   Period Months   Status New               Plan - 08/19/15 0930    Clinical Impression Statement Patient and daughter-law verbalize understanding of how Medicaid will aid cost of new prosthesis. Patient improved prosthestic knee flexion with cueing. Patient appears safe to use cane when ambulating near right UE support like counter, table or rail.    Pt will benefit from skilled therapeutic intervention in order to improve on the following deficits Abnormal gait;Decreased activity tolerance;Decreased balance;Decreased mobility;Prosthetic Dependency   Rehab Potential Good   PT Frequency 2x / week  patient to start 1x/wk until recieves new prosthesis   PT Duration Other (comment)  9 weeks (60 days)   PT Treatment/Interventions ADLs/Self Care Home Management;Gait training;Stair training;Patient/family  education;Functional mobility training;Therapeutic activities;Therapeutic exercise;Balance training;Neuromuscular re-education;Prosthetic Training   PT Next Visit Plan gait with single point cane & prosthesis including barriers   Consulted and Agree with Plan of Care Patient;Family member/caregiver   Family Member Consulted dtr-law        Problem List Patient Active Problem List   Diagnosis Date Noted  . Chest pain 06/24/2015  . Type II diabetes mellitus  with peripheral artery disease ([place]) 08/19/2014  . Gangrene ([place]) 07/22/2014  . PVD (peripheral vascular disease) ([place]) 07/21/2014  . PAD (peripheral artery disease) ([place]) 07/21/2014  . S/P AKA (above knee amputation) ([place]) 07/21/2014  . CAD (coronary artery disease) 07/21/2014  . Dyslipidemia 07/21/2014  . Essential hypertension, benign 07/21/2014    Jamey Reas PT, DPT 08/19/2015, 11:21 PM  [facility] [address] [place], [place], [address] Phone: [phone]   Fax:  [phone]  Name: Hilding Quintanar MRN: [id number] Date of Birth: [birth year]

## 2015-08-26 ENCOUNTER — Encounter: Payer: Self-pay | Admitting: Physical Therapy

## 2015-08-26 ENCOUNTER — Ambulatory Visit: Payer: PPO | Attending: Behavioral Health | Admitting: Physical Therapy

## 2015-08-26 DIAGNOSIS — R269 Unspecified abnormalities of gait and mobility: Secondary | ICD-10-CM | POA: Diagnosis present

## 2015-08-26 DIAGNOSIS — Z7409 Other reduced mobility: Secondary | ICD-10-CM | POA: Diagnosis present

## 2015-08-26 DIAGNOSIS — R29818 Other symptoms and signs involving the nervous system: Secondary | ICD-10-CM | POA: Insufficient documentation

## 2015-08-26 DIAGNOSIS — Z89611 Acquired absence of right leg above knee: Secondary | ICD-10-CM | POA: Insufficient documentation

## 2015-08-26 DIAGNOSIS — R2681 Unsteadiness on feet: Secondary | ICD-10-CM | POA: Insufficient documentation

## 2015-08-26 DIAGNOSIS — R2689 Other abnormalities of gait and mobility: Secondary | ICD-10-CM

## 2015-08-26 NOTE — Therapy (Signed)
[facility] [address] [place], [place], [address] Phone: [phone]   Fax:  [phone]  Physical Therapy Treatment  Patient Details  Name: Stephen Camacho MRN: [id number] Date of Birth: 06-29-1946 Referring Provider: Blenda Nicely, MD  Encounter Date: 08/26/2015      PT End of Session - 08/26/15 0930    Visit Number 3   Number of Visits 18   Date for PT Re-Evaluation 10/10/15   Authorization Type Medicare G-code and progress report every 10 visits.   PT Start Time 0930   PT Stop Time 1015   PT Time Calculation (min) 45 min   Equipment Utilized During Treatment Gait belt   Activity Tolerance Patient tolerated treatment well   Behavior During Therapy WFL for tasks assessed/performed      Past Medical History  Diagnosis Date  . Hyperlipidemia   . Hypertension   . Diabetes mellitus ([place])   . CAD (coronary artery disease)     a. s/p 2 v CABG in 2012 (LIMA-LAD & SVG-OM); b. cath 07/2012 s/p PCI/DES to ostial LCx and OM  . PAD (peripheral artery disease) ([place])     a. s/p right SFA stent; b. s/p right toe amputation 2011; c. s/p right AKA spring 2016  . Seizures ([place])   . Gangrene of foot ([place])   . Wheezing   . Lung cancer ([place])   . Prostate cancer ([place])   . Stroke [facility])     Past Surgical History  Procedure Laterality Date  . Right aka  07-10-2014  . Left femoral popliteal bypass    . Right lung lobeectomy    . Coronary artery bypass graft      There were no vitals filed for this visit.  Visit Diagnosis:  Abnormality of gait  Balance problems  Impaired functional mobility and activity tolerance  Status post above knee amputation of right lower extremity (HCC)  Unsteadiness      Subjective Assessment - 08/26/15 0930    Subjective His daughter filed for Medicaid. Patient reports walking with his cane along counters in kitchen.    Patient is accompained by: Family member   Currently in  Pain? No/denies                         [facility] Adult PT Treatment/Exercise - 08/26/15 0930    Transfers   Transfers Sit to Stand;Stand to Sit   Sit to Stand 5: Supervision   Sit to Stand Details (indicate cue type and reason) verbal cues to initiate prosthetic knee control quicker   Stand to Sit 6: Modified independent (Device/Increase time);With upper extremity assist   Ambulation/Gait   Ambulation/Gait Yes   Ambulation/Gait Assistance 4: Min assist  light hand hold   Ambulation/Gait Assistance Details verbal, visual and tactile cues on sequence, step length for step thru, knee control and knee flexion in swing. PT demo how son to assist.    Ambulation Distance (Feet) 100 Feet  100' X 3   Assistive device Prosthesis;Small based quad cane;Straight cane   Gait Pattern Step-to pattern;Decreased stance time - right;Decreased stride length;Decreased step length - left;Decreased hip/knee flexion - right;Right steppage;Decreased trunk rotation;Trunk flexed;Abducted- right   Ambulation Surface Indoor;Level   Stairs Yes   Stairs Assistance 5: Supervision   Stairs Assistance Details (indicate cue type and reason) verbal & tactile cues on technique using cane and 1 rail varying location of rail.    Stair Management Technique One rail  Right;One rail Left;Step to pattern;Forwards;With cane   Number of Stairs 4  3 reps with right rail, left rail and 2 rails but too wide   Ramp 3: Mod assist  SBQC & prosthesis   Ramp Details (indicate cue type and reason) verbal, tactile & demo cues on wt shift, posture & step length   Curb 3: Mod assist  SBQC & prosthesis   Curb Details (indicate cue type and reason) demo & verbal cues on technique with cane & prosthesis                PT Education - 08/26/15 0930    Education provided Yes   Education Details instructed how to assist with cane & light hand hold including barriers   Person(s) Educated Patient;Child(ren)   Methods  Explanation;Demonstration;Tactile cues;Verbal cues   Comprehension Verbalized understanding;Returned demonstration;Verbal cues required;Tactile cues required;Need further instruction          PT Short Term Goals - 08/12/15 0845    PT SHORT TERM GOAL #1   Title Patient ambulates 200' with prosthesis and single point cane with supervision. (Target Date: 09/11/2015)   Time 1   Period Months   Status New   PT SHORT TERM GOAL #2   Title Patient negotiates ramps & curbs with single point cane & prosthesis with supervision. . (Target Date: 09/11/2015)   Time 1   Period Months   Status New   PT SHORT TERM GOAL #3   Title Patient ambulates 46' with prosthesis only with minimal assist. . (Target Date: 09/11/2015)   Time 1   Period Months   Status New           PT Long Term Goals - 08/12/15 0845    PT LONG TERM GOAL #1   Title Patient demonstrates / verbalizes proper prosthetic care of new prosthesis & componentry. (Target Date: 10/10/2015)   Time 2   Period Months   Status New   PT LONG TERM GOAL #2   Title Patient tolerates wear of new prosthesis >90% of awake hours without skin issues or limb tenderness.  (Target Date: 10/10/2015)   Time 2   Period Months   Status New   PT LONG TERM GOAL #3   Title Patient ambulates 500' with single point cane & prosthesis modified independent.  (Target Date: 10/10/2015)   Time 2   Period Months   Status New   PT LONG TERM GOAL #4   Title Patient negotiates ramps, curbs & stairs (1 rail) with single point cane & prosthesis modified independent.  (Target Date: 10/10/2015)   Time 2   Period Months   Status New   PT LONG TERM GOAL #5   Title Patient ambulates 100' carrying laundry basket around furniture with prosthesis only modified indpendent to enable household mobility.  (Target Date: 10/10/2015)   Time 2   Period Months   Status New   Additional Long Term Goals   Additional Long Term Goals Yes   PT LONG TERM GOAL #6   Title Berg  Balance > 45/56 to lower fall risk.  (Target Date: 10/10/2015)   Time 2   Period Months   Status New   PT LONG TERM GOAL #7   Title Timed Up & Go <30 seconds with prosthesis only.  (Target Date: 10/10/2015)   Time 2   Period Months   Status New               Plan - 08/26/15 0930  Clinical Impression Statement Patient and his son appear to understand safe assistance to ambulate with [facility]. 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 Patient improved ability to negotiate barriers with instruction & practice.   Pt will benefit from skilled therapeutic intervention in order to improve on the following deficits Abnormal gait;Decreased activity tolerance;Decreased balance;Decreased mobility;Prosthetic Dependency   Rehab Potential Good   PT Frequency 2x / week  patient to start 1x/wk until recieves new prosthesis   PT Duration Other (comment)  9 weeks (60 days)   PT Treatment/Interventions ADLs/Self Care Home Management;Gait training;Stair training;Patient/family education;Functional mobility training;Therapeutic activities;Therapeutic exercise;Balance training;Neuromuscular re-education;Prosthetic Training   PT Next Visit Plan gait with cane & prosthesis including barriers   Consulted and Agree with Plan of Care Patient;Family member/caregiver   Family Member Consulted dtr-law        Problem List Patient Active Problem List   Diagnosis Date Noted  . Chest pain 06/24/2015  . 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 Gangrene ([place]) 07/22/2014  . PVD (peripheral vascular disease) ([place]) 07/21/2014  . PAD (peripheral artery disease) ([place]) 07/21/2014  . S/P AKA (above knee amputation) ([place]) 07/21/2014  . CAD (coronary artery disease) 07/21/2014  . Dyslipidemia 07/21/2014  . Essential hypertension, benign 07/21/2014    Jamey Reas PT, DPT 08/26/2015, 6:06 PM  [facility] [address] [place], [place], [address] Phone: [phone]   Fax:  [phone]  Name: Stephen Camacho MRN: [id number] Date of Birth: 12-Feb-1946

## 2015-09-02 ENCOUNTER — Ambulatory Visit: Payer: PPO | Admitting: Physical Therapy

## 2015-09-05 ENCOUNTER — Ambulatory Visit: Payer: PPO | Admitting: Physical Therapy

## 2015-09-05 ENCOUNTER — Encounter: Payer: Self-pay | Admitting: Physical Therapy

## 2015-09-05 DIAGNOSIS — R269 Unspecified abnormalities of gait and mobility: Secondary | ICD-10-CM

## 2015-09-05 DIAGNOSIS — R2681 Unsteadiness on feet: Secondary | ICD-10-CM

## 2015-09-05 DIAGNOSIS — Z7409 Other reduced mobility: Secondary | ICD-10-CM

## 2015-09-05 DIAGNOSIS — R2689 Other abnormalities of gait and mobility: Secondary | ICD-10-CM

## 2015-09-05 DIAGNOSIS — Z89611 Acquired absence of right leg above knee: Secondary | ICD-10-CM

## 2015-09-05 NOTE — Patient Instructions (Signed)
Pt. Instructed on proper technique and sequencing for curb and stair navigation.  Pt. also instructed on posture awareness and importance of scapular retraction exercises.

## 2015-09-05 NOTE — Therapy (Signed)
[facility] [address] [place], [place], [address] Phone: [phone]   Fax:  [phone]  Physical Therapy Treatment  Patient Details  Name: Stephen Camacho MRN: [id number] Date of Birth: 01/19/1946 Referring Provider: Blenda Nicely, MD  Encounter Date: 09/05/2015      PT End of Session - 09/05/15 0917    Visit Number 4   Number of Visits 18   Date for PT Re-Evaluation 10/10/15   Authorization Type Medicare G-code and progress report every 10 visits.   PT Start Time 0802   PT Stop Time 0845   PT Time Calculation (min) 43 min   Equipment Utilized During Treatment Gait belt   Activity Tolerance Patient tolerated treatment well   Behavior During Therapy WFL for tasks assessed/performed      Past Medical History  Diagnosis Date  . Hyperlipidemia   . Hypertension   . Diabetes mellitus ([place])   . CAD (coronary artery disease)     a. s/p 2 v CABG in 2012 (LIMA-LAD & SVG-OM); b. cath 07/2012 s/p PCI/DES to ostial LCx and OM  . PAD (peripheral artery disease) ([place])     a. s/p right SFA stent; b. s/p right toe amputation 2011; c. s/p right AKA spring 2016  . Seizures ([place])   . Gangrene of foot ([place])   . Wheezing   . Lung cancer ([place])   . Prostate cancer ([place])   . Stroke [facility])     Past Surgical History  Procedure Laterality Date  . Right aka  07-10-2014  . Left femoral popliteal bypass    . Right lung lobeectomy    . Coronary artery bypass graft      There were no vitals filed for this visit.  Visit Diagnosis:  Abnormality of gait  Balance problems  Impaired functional mobility and activity tolerance  Status post above knee amputation of right lower extremity (HCC)  Unsteadiness      Subjective Assessment - 09/05/15 1644    Subjective Pt.'s daughter reports her father isn't wearing the prosthesis much at home.  No pain or new complaints.     Patient is accompained by: Family member   Currently in Pain? No/denies   Multiple Pain Sites No     Therex (to improve posture and strengthen scapular retractors): - Scapular retraction 4 x 10 with blue TB; Verbal cues provided for technique, pacing, and posture awareness.   *all scapular retraction performed during rest breaks between gait trails.           [facility] Adult PT Treatment/Exercise - 09/05/15 0924    Transfers   Transfers Sit to Stand;Stand to Sit   Sit to Stand 5: Supervision   Sit to Stand Details Visual cues/gestures for sequencing;Verbal cues for technique;Tactile cues for placement   Sit to Stand Details (indicate cue type and reason) Verbal cues provided to increase BOS with sit>stand    Stand to Sit 5: Supervision;With upper extremity assist   Stand to Sit Details (indicate cue type and reason) Tactile cues for sequencing;Verbal cues for technique;Verbal cues for sequencing   Stand to Sit Details Verbal cues provided to increase BOS; pt. demo'd understanding of technique.     Ambulation/Gait   Ambulation/Gait Yes   Ambulation/Gait Assistance 5: Supervision   Ambulation/Gait Assistance Details Total ambulation distance 418f with single point cane supervision; gait trials taken in 1161fperiods with sitting rest breaks following each trial; scapular retraction performed in each rest break with blue TB x  15 reps; Verbal cues to promote posture awareness/head up posture; pt. demo's narrow BOS shortened step length; verbal cues provided to correct gait abnormalities.     Ambulation Distance (Feet) 460 Feet   Assistive device Prosthesis;Small based quad cane;Straight cane   Gait Pattern Step-to pattern;Decreased stance time - right;Decreased stride length;Decreased step length - left;Decreased hip/knee flexion - right;Right steppage;Decreased trunk rotation;Trunk flexed;Abducted- right   Stairs Yes   Stairs Assistance 5: Supervision   Stairs Assistance Details (indicate cue type and reason) Verbal cues provided for  technique with singel point cane   Stair Management Technique One rail Right;One rail Left;Step to pattern;Forwards;With cane   Number of Stairs 4   Ramp 4: Min assist   Ramp Details (indicate cue type and reason) verbal cues on wt. shift posture and step length.     Curb 4: Min assist   Curb Details (indicate cue type and reason) Pt. required instruction for proper sequencing; pt. demo'd understanding of sequencing.            PT Short Term Goals - 08/12/15 0845    PT SHORT TERM GOAL #1   Title Patient ambulates 200' with prosthesis and single point cane with supervision. (Target Date: 09/11/2015)   Time 1   Period Months   Status New   PT SHORT TERM GOAL #2   Title Patient negotiates ramps & curbs with single point cane & prosthesis with supervision. . (Target Date: 09/11/2015)   Time 1   Period Months   Status New   PT SHORT TERM GOAL #3   Title Patient ambulates 68' with prosthesis only with minimal assist. . (Target Date: 09/11/2015)   Time 1   Period Months   Status New           PT Long Term Goals - 08/12/15 0845    PT LONG TERM GOAL #1   Title Patient demonstrates / verbalizes proper prosthetic care of new prosthesis & componentry. (Target Date: 10/10/2015)   Time 2   Period Months   Status New   PT LONG TERM GOAL #2   Title Patient tolerates wear of new prosthesis >90% of awake hours without skin issues or limb tenderness.  (Target Date: 10/10/2015)   Time 2   Period Months   Status New   PT LONG TERM GOAL #3   Title Patient ambulates 500' with single point cane & prosthesis modified independent.  (Target Date: 10/10/2015)   Time 2   Period Months   Status New   PT LONG TERM GOAL #4   Title Patient negotiates ramps, curbs & stairs (1 rail) with single point cane & prosthesis modified independent.  (Target Date: 10/10/2015)   Time 2   Period Months   Status New   PT LONG TERM GOAL #5   Title Patient ambulates 100' carrying laundry basket around  furniture with prosthesis only modified indpendent to enable household mobility.  (Target Date: 10/10/2015)   Time 2   Period Months   Status New   Additional Long Term Goals   Additional Long Term Goals Yes   PT LONG TERM GOAL #6   Title Berg Balance > 45/56 to lower fall risk.  (Target Date: 10/10/2015)   Time 2   Period Months   Status New   PT LONG TERM GOAL #7   Title Timed Up & Go <30 seconds with prosthesis only.  (Target Date: 10/10/2015)   Time 2   Period Months   Status New  Plan - 09/05/15 0918    Clinical Impression Statement Pt. continues to progress toward all goals.  Pt. increased gait distance and decreased assistance level required for ramp/curb and stair navigation.  Skilled session focused on gait/ambulation training with prosthesis and navigating barriers, all with posture awareness.     Pt will benefit from skilled therapeutic intervention in order to improve on the following deficits Abnormal gait;Decreased activity tolerance;Decreased balance;Decreased mobility;Prosthetic Dependency   Rehab Potential Good   PT Frequency 2x / week  patient to start 1x/wk until recieves new prosthesis   PT Duration Other (comment)  9 weeks (60 days)   PT Treatment/Interventions ADLs/Self Care Home Management;Gait training;Stair training;Patient/family education;Functional mobility training;Therapeutic activities;Therapeutic exercise;Balance training;Neuromuscular re-education;Prosthetic Training   PT Next Visit Plan gait with cane & prosthesis including barriers.  Reinforcement of sequencing with ramp/curb, stair navigation.     Consulted and Agree with Plan of Care Patient;Family member/caregiver   Family Member Consulted dtr-law        Problem List Patient Active Problem List   Diagnosis Date Noted  . Chest pain 06/24/2015  . 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 Gangrene ([place]) 07/22/2014  . PVD (peripheral vascular  disease) ([place]) 07/21/2014  . PAD (peripheral artery disease) ([place]) 07/21/2014  . S/P AKA (above knee amputation) ([place]) 07/21/2014  . CAD (coronary artery disease) 07/21/2014  . Dyslipidemia 07/21/2014  . Essential hypertension, benign 07/21/2014    [place] 09/05/2015, 4:58 PM  [place], [place]  Name: Lizzie Cokley MRN: [id number] Date of Birth: 06/10/1946  This note has been reviewed and edited by supervising CI.  Willow Ora, PTA, [facility] [address] [place], [place] [address] [phone] 09/06/2015, 10:00 PM

## 2015-09-09 ENCOUNTER — Ambulatory Visit: Payer: PPO | Admitting: Physical Therapy

## 2015-09-09 ENCOUNTER — Encounter: Payer: Self-pay | Admitting: Physical Therapy

## 2015-09-09 DIAGNOSIS — R2689 Other abnormalities of gait and mobility: Secondary | ICD-10-CM

## 2015-09-09 DIAGNOSIS — Z7409 Other reduced mobility: Secondary | ICD-10-CM

## 2015-09-09 DIAGNOSIS — R2681 Unsteadiness on feet: Secondary | ICD-10-CM

## 2015-09-09 DIAGNOSIS — R269 Unspecified abnormalities of gait and mobility: Secondary | ICD-10-CM | POA: Diagnosis not present

## 2015-09-09 DIAGNOSIS — Z89611 Acquired absence of right leg above knee: Secondary | ICD-10-CM

## 2015-09-09 NOTE — Therapy (Signed)
[facility] [address] [place], [place], [address] Phone: [phone]   Fax:  [phone]  Physical Therapy Treatment  Patient Details  Name: Stephen Camacho MRN: [id number] Date of Birth: Nov 07, 1945 Referring Provider: Blenda Nicely, MD  Encounter Date: 09/09/2015      PT End of Session - 09/09/15 1015    Visit Number 5   Number of Visits 18   Date for PT Re-Evaluation 10/10/15   Authorization Type Medicare G-code and progress report every 10 visits.   PT Start Time [phone]   PT Stop Time 1015   PT Time Calculation (min) 25 min   Equipment Utilized During Treatment Gait belt   Activity Tolerance Patient tolerated treatment well   Behavior During Therapy WFL for tasks assessed/performed      Past Medical History  Diagnosis Date  . Hyperlipidemia   . Hypertension   . Diabetes mellitus ([place])   . CAD (coronary artery disease)     a. s/p 2 v CABG in 2012 (LIMA-LAD & SVG-OM); b. cath 07/2012 s/p PCI/DES to ostial LCx and OM  . PAD (peripheral artery disease) ([place])     a. s/p right SFA stent; b. s/p right toe amputation 2011; c. s/p right AKA spring 2016  . Seizures ([place])   . Gangrene of foot ([place])   . Wheezing   . Lung cancer ([place])   . Prostate cancer ([place])   . Stroke [facility])     Past Surgical History  Procedure Laterality Date  . Right aka  07-10-2014  . Left femoral popliteal bypass    . Right lung lobeectomy    . Coronary artery bypass graft      There were no vitals filed for this visit.  Visit Diagnosis:  Abnormality of gait  Balance problems  Impaired functional mobility and activity tolerance  Status post above knee amputation of right lower extremity (HCC)  Unsteadiness      Subjective Assessment - 09/09/15 1238    Subjective Patient reports he wears his prosthesis daily now for most of awake hours. He has been practicing as PT recommended with his SBQC. They have not heard from  Medicaid yet.    Currently in Pain? No/denies      Prosthetic Training with Transfemoral Amputation Prosthesis: PT reviewed need to check tightness of suspension strap occasionally during the day as it can loosen as his limb seats deeper into socket with gait activities. Patient return demonstrated understanding.  Patient ambulated 200' with [facility] with supervision with cues on wt shift over prosthesis in stance with left LE step thru. Patient ambulated 130' X 2 with SBQC with tactile cues to facilitate pelvic wt shift.  Patient negotiated ramp & curb with [facility] with contact assist with cues.  See pt education.                            PT Education - 09/09/15 1015    Education provided Yes   Education Details standing with RW weight shift over prosthesis with left LE flexing / rolling onto toes (simulate terminal stance on LLE) then progressed this exercise to stepping thru with heel contact of LLE  ~2" past prosthetic toes.    Person(s) Educated Patient;Spouse   Methods Explanation;Demonstration;Tactile cues;Verbal cues   Comprehension Verbalized understanding;Returned demonstration;Verbal cues required;Tactile cues required;Need further instruction          PT Short Term Goals - 09/09/15 1015  PT SHORT TERM GOAL #1   Title Patient ambulates 200' with prosthesis and single point cane with supervision. (Target Date: 09/11/2015)   Baseline MET 09/09/2015 with SBQC   Time 1   Period Months   Status Achieved   PT SHORT TERM GOAL #2   Title Patient negotiates ramps & curbs with single point cane & prosthesis with supervision. . (Target Date: 09/11/2015)   Baseline Partially MET 09/09/2015 Patient negotiates ramps & curbs with contact assist with [facility].    Time 1   Period Months   Status Partially Met   PT SHORT TERM GOAL #3   Title Patient ambulates 65' with prosthesis only with minimal assist. . (Target Date: 09/11/2015)   Baseline NOT MET 09/09/2015 He was  late to appointment so PT did not address.    Time 1   Period Months   Status Not Met           PT Long Term Goals - 09/09/15 1015    PT LONG TERM GOAL #1   Title Patient demonstrates / verbalizes proper prosthetic care of new prosthesis & componentry. (Target Date: 10/10/2015)   Baseline PT deferred 09/09/2015 as he is awaiting Medicaid to help with needed finances for new prosthesis.    Time 2   Period Months   Status Deferred   PT LONG TERM GOAL #2   Title Patient tolerates wear of new prosthesis >90% of awake hours without skin issues or limb tenderness.  (Target Date: 10/10/2015)   Baseline PT deferred 09/09/2015 as he is awaiting Medicaid to help with needed finances for new prosthesis.    Time 2   Period Months   Status Deferred   PT LONG TERM GOAL #3   Title Patient ambulates 500' with cane & prosthesis modified independent.  (Target Date: 10/10/2015)   Time 2   Period Months   Status On-going   PT LONG TERM GOAL #4   Title Patient negotiates ramps, curbs & stairs (1 rail) with single point cane & prosthesis modified independent.  (Target Date: 10/10/2015)   Time 2   Period Months   Status On-going   PT LONG TERM GOAL #5   Title Patient ambulates 100' carrying laundry basket around furniture with prosthesis only modified indpendent to enable household mobility.  (Target Date: 10/10/2015)   Time 2   Period Months   Status On-going   PT LONG TERM GOAL #6   Title Berg Balance > 45/56 to lower fall risk.  (Target Date: 10/10/2015)   Time 2   Period Months   Status On-going   PT LONG TERM GOAL #7   Title Timed Up & Go <30 seconds with prosthesis only.  (Target Date: 10/10/2015)   Time 2   Period Months   Status On-going               Plan - 09/09/15 1015    Clinical Impression Statement Patient met 2 of 3 LTGs. 3rd goal was not addressed as patient arrived 20 minutes late to 45 minute appt as got appt time wrong. Patient needed less assistance for gait  with [facility] today including barriers. He seemed to understand weight shift onto prosthesis in stance better after exercise  & gait training.    Pt will benefit from skilled therapeutic intervention in order to improve on the following deficits Abnormal gait;Decreased activity tolerance;Decreased balance;Decreased mobility;Prosthetic Dependency   Rehab Potential Good   PT Frequency 2x / week  patient to start 1x/wk until  recieves new prosthesis   PT Duration Other (comment)  9 weeks (60 days)   PT Treatment/Interventions ADLs/Self Care Home Management;Gait training;Stair training;Patient/family education;Functional mobility training;Therapeutic activities;Therapeutic exercise;Balance training;Neuromuscular re-education;Prosthetic Training   PT Next Visit Plan gait with cane & prosthesis including barriers.  Reinforcement of sequencing with ramp/curb, stair navigation.     Consulted and Agree with Plan of Care Patient;Family member/caregiver   Family Member Consulted son        Problem List Patient Active Problem List   Diagnosis Date Noted  . Chest pain 06/24/2015  . 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 Gangrene ([place]) 07/22/2014  . PVD (peripheral vascular disease) ([place]) 07/21/2014  . PAD (peripheral artery disease) ([place]) 07/21/2014  . S/P AKA (above knee amputation) ([place]) 07/21/2014  . CAD (coronary artery disease) 07/21/2014  . Dyslipidemia 07/21/2014  . Essential hypertension, benign 07/21/2014    Jamey Reas PT, DPT 09/09/2015, 12:52 PM  [facility] [address] [place], [place], [address] Phone: [phone]   Fax:  [phone]  Name: Nicholous Girgenti MRN: [id number] Date of Birth: Aug 08, 1946

## 2015-09-16 ENCOUNTER — Encounter: Payer: PPO | Admitting: Physical Therapy

## 2015-09-17 ENCOUNTER — Encounter: Payer: Self-pay | Admitting: Physical Therapy

## 2015-09-17 ENCOUNTER — Ambulatory Visit: Payer: PPO | Admitting: Physical Therapy

## 2015-09-17 DIAGNOSIS — R269 Unspecified abnormalities of gait and mobility: Secondary | ICD-10-CM

## 2015-09-17 DIAGNOSIS — R2689 Other abnormalities of gait and mobility: Secondary | ICD-10-CM

## 2015-09-17 DIAGNOSIS — Z7409 Other reduced mobility: Secondary | ICD-10-CM

## 2015-09-17 DIAGNOSIS — Z89611 Acquired absence of right leg above knee: Secondary | ICD-10-CM

## 2015-09-17 DIAGNOSIS — R2681 Unsteadiness on feet: Secondary | ICD-10-CM

## 2015-09-17 NOTE — Therapy (Signed)
[facility] [address] [place], [place], [address] Phone: [phone]   Fax:  [phone]  Physical Therapy Treatment  Patient Details  Name: Stephen Camacho MRN: [id number] Date of Birth: 08-13-1946 Referring Provider: Blenda Nicely, MD  Encounter Date: 09/17/2015      PT End of Session - 09/17/15 0930    Visit Number 6   Number of Visits 18   Date for PT Re-Evaluation 10/10/15   Authorization Type Medicare G-code and progress report every 10 visits.   PT Start Time 0845   PT Stop Time 0930   PT Time Calculation (min) 45 min   Equipment Utilized During Treatment Gait belt   Activity Tolerance Patient tolerated treatment well   Behavior During Therapy WFL for tasks assessed/performed      Past Medical History  Diagnosis Date  . Hyperlipidemia   . Hypertension   . Diabetes mellitus ([place])   . CAD (coronary artery disease)     a. s/p 2 v CABG in 2012 (LIMA-LAD & SVG-OM); b. cath 07/2012 s/p PCI/DES to ostial LCx and OM  . PAD (peripheral artery disease) ([place])     a. s/p right SFA stent; b. s/p right toe amputation 2011; c. s/p right AKA spring 2016  . Seizures ([place])   . Gangrene of foot ([place])   . Wheezing   . Lung cancer ([place])   . Prostate cancer ([place])   . Stroke [facility])     Past Surgical History  Procedure Laterality Date  . Right aka  07-10-2014  . Left femoral popliteal bypass    . Right lung lobeectomy    . Coronary artery bypass graft      There were no vitals filed for this visit.  Visit Diagnosis:  Abnormality of gait  Balance problems  Impaired functional mobility and activity tolerance  Status post above knee amputation of right lower extremity (HCC)  Unsteadiness      Subjective Assessment - 09/17/15 0852    Subjective He has been using cane. No falls or issues.    Currently in Pain? No/denies                         [facility] Adult PT Treatment/Exercise -  09/17/15 0845    Transfers   Transfers Sit to Stand;Stand to Sit   Sit to Stand 5: Supervision   Sit to Stand Details Visual cues/gestures for sequencing;Verbal cues for technique;Tactile cues for placement   Sit to Stand Details (indicate cue type and reason) verbal cues & demo for chairs without armrests.   Stand to Sit 5: Supervision;With upper extremity assist   Stand to Sit Details (indicate cue type and reason) Tactile cues for sequencing;Verbal cues for technique;Verbal cues for sequencing   Stand to Sit Details Verbal cues & demo for chairs without armrests.   Ambulation/Gait   Ambulation/Gait Yes   Ambulation/Gait Assistance 5: Supervision   Ambulation/Gait Assistance Details Verbal cues on initial contact knee control, weight shift over prosthesis in stance and knee flexion in terminal stance. Carrying cup of water 75' around tables and then plate of objects 72' around tables with supervision.    Ambulation Distance (Feet) 400 Feet  400' X 2,    Assistive device Prosthesis;Straight cane  single point cane with quad tip   Gait Pattern Step-to pattern;Decreased stance time - right;Decreased stride length;Decreased step length - left;Decreased hip/knee flexion - right;Right steppage;Decreased trunk rotation;Trunk flexed;Abducted- right   Ambulation  Surface Indoor;Level   Stairs Yes   Stairs Assistance 5: Supervision   Stairs Assistance Details (indicate cue type and reason) cues on cane placement with various rail positions and sequence   Stair Management Technique One rail Right;One rail Left;Step to pattern;Forwards;With cane   Number of Stairs 4  3 reps   Ramp 4: Min assist  single point cane & prosthesis   Ramp Details (indicate cue type and reason) verbal cues on step length and weight shift   Curb 4: Min assist  single point cane & prosthesis   Curb Details (indicate cue type and reason) verbal cues on step length and weight shift   High Level Balance   High Level Balance  Activities Negotitating around obstacles;Negotiating over obstacles;Head turns   High Level Balance Comments MinA & verbal cues on technique to step over obstacles: 2" X 4" and 4" X 2" forward facing and 4" X 6" and 5" X 4" sideways; turns head to scan with single point cane with minA                  PT Short Term Goals - 09/09/15 1015    PT SHORT TERM GOAL #1   Title Patient ambulates 200' with prosthesis and single point cane with supervision. (Target Date: 09/11/2015)   Baseline MET 09/09/2015 with SBQC   Time 1   Period Months   Status Achieved   PT SHORT TERM GOAL #2   Title Patient negotiates ramps & curbs with single point cane & prosthesis with supervision. . (Target Date: 09/11/2015)   Baseline Partially MET 09/09/2015 Patient negotiates ramps & curbs with contact assist with [facility].    Time 1   Period Months   Status Partially Met   PT SHORT TERM GOAL #3   Title Patient ambulates 82' with prosthesis only with minimal assist. . (Target Date: 09/11/2015)   Baseline NOT MET 09/09/2015 He was late to appointment so PT did not address.    Time 1   Period Months   Status Not Met           PT Long Term Goals - 09/09/15 1015    PT LONG TERM GOAL #1   Title Patient demonstrates / verbalizes proper prosthetic care of new prosthesis & componentry. (Target Date: 10/10/2015)   Baseline PT deferred 09/09/2015 as he is awaiting Medicaid to help with needed finances for new prosthesis.    Time 2   Period Months   Status Deferred   PT LONG TERM GOAL #2   Title Patient tolerates wear of new prosthesis >90% of awake hours without skin issues or limb tenderness.  (Target Date: 10/10/2015)   Baseline PT deferred 09/09/2015 as he is awaiting Medicaid to help with needed finances for new prosthesis.    Time 2   Period Months   Status Deferred   PT LONG TERM GOAL #3   Title Patient ambulates 500' with cane & prosthesis modified independent.  (Target Date: 10/10/2015)   Time 2    Period Months   Status On-going   PT LONG TERM GOAL #4   Title Patient negotiates ramps, curbs & stairs (1 rail) with single point cane & prosthesis modified independent.  (Target Date: 10/10/2015)   Time 2   Period Months   Status On-going   PT LONG TERM GOAL #5   Title Patient ambulates 100' carrying laundry basket around furniture with prosthesis only modified indpendent to enable household mobility.  (Target Date: 10/10/2015)  Time 2   Period Months   Status On-going   PT LONG TERM GOAL #6   Title Berg Balance > 45/56 to lower fall risk.  (Target Date: 10/10/2015)   Time 2   Period Months   Status On-going   PT LONG TERM GOAL #7   Title Timed Up & Go <30 seconds with prosthesis only.  (Target Date: 10/10/2015)   Time 2   Period Months   Status On-going               Plan - 09/17/15 0930    Clinical Impression Statement Patient improved prosthetic gait with single point cane on level surfaces to supervision level including carrying items around household furniture. He requires minA for ramps, curbs, stepping over obstacles and turning head to scan environemnt.    Pt will benefit from skilled therapeutic intervention in order to improve on the following deficits Abnormal gait;Decreased activity tolerance;Decreased balance;Decreased mobility;Prosthetic Dependency   Rehab Potential Good   PT Frequency 2x / week  patient to start 1x/wk until recieves new prosthesis   PT Duration Other (comment)  9 weeks (60 days)   PT Treatment/Interventions ADLs/Self Care Home Management;Gait training;Stair training;Patient/family education;Functional mobility training;Therapeutic activities;Therapeutic exercise;Balance training;Neuromuscular re-education;Prosthetic Training   PT Next Visit Plan gait with cane & prosthesis including barriers.  Reinforcement of sequencing with ramp/curb, stair navigation.     Consulted and Agree with Plan of Care Patient;Family member/caregiver   Family  Member Consulted son        Problem List Patient Active Problem List   Diagnosis Date Noted  . Chest pain 06/24/2015  . 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 Gangrene ([place]) 07/22/2014  . PVD (peripheral vascular disease) ([place]) 07/21/2014  . PAD (peripheral artery disease) ([place]) 07/21/2014  . S/P AKA (above knee amputation) ([place]) 07/21/2014  . CAD (coronary artery disease) 07/21/2014  . Dyslipidemia 07/21/2014  . Essential hypertension, benign 07/21/2014    Jamey Reas PT, DPT 09/17/2015, 6:20 PM  [facility] [address] [place], [place], [address] Phone: [phone]   Fax:  [phone]  Name: Nameer Summer MRN: [id number] Date of Birth: 12-05-1945

## 2015-09-23 ENCOUNTER — Encounter: Payer: Self-pay | Admitting: Physical Therapy

## 2015-09-23 ENCOUNTER — Ambulatory Visit: Payer: PPO | Admitting: Physical Therapy

## 2015-09-23 DIAGNOSIS — R2689 Other abnormalities of gait and mobility: Secondary | ICD-10-CM

## 2015-09-23 DIAGNOSIS — R2681 Unsteadiness on feet: Secondary | ICD-10-CM

## 2015-09-23 DIAGNOSIS — R269 Unspecified abnormalities of gait and mobility: Secondary | ICD-10-CM | POA: Diagnosis not present

## 2015-09-23 DIAGNOSIS — Z89611 Acquired absence of right leg above knee: Secondary | ICD-10-CM

## 2015-09-23 DIAGNOSIS — Z7409 Other reduced mobility: Secondary | ICD-10-CM

## 2015-09-23 NOTE — Therapy (Signed)
[facility] [address] [place], [place], [address] Phone: [phone]   Fax:  [phone]  Physical Therapy Treatment  Patient Details  Name: Stephen Camacho MRN: [id number] Date of Birth: May 22, 1946 Referring Provider: Blenda Nicely, MD  Encounter Date: 09/23/2015      PT End of Session - 09/23/15 0948    Visit Number 7   Number of Visits 18   Date for PT Re-Evaluation 10/10/15   Authorization Type Medicare G-code and progress report every 10 visits.   PT Start Time [id number]   PT Stop Time 0930   PT Time Calculation (min) 40 min   Equipment Utilized During Treatment Gait belt   Activity Tolerance Patient tolerated treatment well   Behavior During Therapy WFL for tasks assessed/performed      Past Medical History  Diagnosis Date  . Hyperlipidemia   . Hypertension   . Diabetes mellitus ([place])   . CAD (coronary artery disease)     a. s/p 2 v CABG in 2012 (LIMA-LAD & SVG-OM); b. cath 07/2012 s/p PCI/DES to ostial LCx and OM  . PAD (peripheral artery disease) ([place] [address])     a. s/p right SFA stent; b. s/p right toe amputation 2011; c. s/p right AKA spring 2016  . Seizures ([place])   . Gangrene of foot ([place])   . Wheezing   . Lung cancer ([place])   . Prostate cancer ([place])   . Stroke [facility])     Past Surgical History  Procedure Laterality Date  . Right aka  07-10-2014  . Left femoral popliteal bypass    . Right lung lobeectomy    . Coronary artery bypass graft      There were no vitals filed for this visit.  Visit Diagnosis:  Abnormality of gait  Balance problems  Impaired functional mobility and activity tolerance  Status post above knee amputation of right lower extremity (HCC)  Unsteadiness      Subjective Assessment - 09/23/15 0940    Subjective Pt reports no changes in status. Pt reports that the prosthesis did not feel any different this morning before treatment. Pt reported that the R LE feels shorter  than the L LE.     Prosthetic Training: TO increase pt's functional independence with R above knee prosthesis and straight cane at home and out in the community. Pt used straight cane with quad tip for entire session today.   1. Pt ambulated 334f X1 and 726fX1 with straight cane with quad tip on indoor, level surfaces. Pt required min guard to min assist due to multiple LOB. Verbal and tactile cues for increasing step length on L and using R hip flexors during swing phase on R for advancement & decreasing leaning trunk to left to increase foot clearance on R.   RoJamey ReasPT, DPT wrote PT phoned prosthetist who will see pt after PT session today to check ht as prosthesis appears ~1/4" too short and to check knee extension assist as prosthesis tracks to extension during swing slower than previously. Patient is flexing prosthesis more in terminal stance for improved carryover from PT training which is why extension assist issues is more apparent now.   2. Stairs: Pt. negotiated 1 rep of 4 steps with hand rail on R/cane on L and 1 rep of 4 steps with hand rail on L/cane on R. Pt supervision assist  Pt required cues for sequencing and use of straight cane.  3. Curb and Ramp: Pt negotiated curb  and ramp X3 with min guard to min assist due to LOB with curb ascent. Pt required verbal cues for sequencing and for proper weight shift with ascent and descent on ramp.  4. Gait with head turns to scan environment: In hall to enable visual reference of walls to maintain path. Pt ambulated with straight cane with min assist for occasional balance losses and tactile cues for path.                     [facility] Adult PT Treatment/Exercise - 09/23/15 0001    Prosthetics   Prosthetic Care Comments  Pt reports that skin is WNL and is wearing the prosthesis all awake hours.   Current prosthetic wear tolerance (days/week)  7 days   Current prosthetic wear tolerance (#hours/day)  all awake hours.    Current prosthetic weight-bearing tolerance (hours/day)  tolerated 20 minutes of standing & gait without issues   Residual limb condition  no open areas, normal color, moisture, hair growth and temperature  Skin WNL per pt. comment.                PT Education - 09/23/15 1033    Education provided Yes   Education Details Pt educated on benefits of straight cane with 4 point tip. The cane appears to fit his needs best.   Person(s) Educated Patient;Child(ren)   Methods Explanation   Comprehension Verbalized understanding          PT Short Term Goals - 09/23/15 0947    PT SHORT TERM GOAL #1   Title Patient ambulates 200' with prosthesis and single point cane with supervision. (Target Date: 09/11/2015)   Baseline MET 09/09/2015 with SBQC   Time 1   Period Months   Status Achieved   PT SHORT TERM GOAL #2   Title Patient negotiates ramps & curbs with single point cane & prosthesis with supervision. . (Target Date: 09/11/2015)   Baseline Partially MET 09/09/2015 Patient negotiates ramps & curbs with contact assist with [facility].    Time 1   Period Months   Status Partially Met   PT SHORT TERM GOAL #3   Title Patient ambulates 61' with prosthesis only with minimal assist. . (Target Date: 09/11/2015)   Baseline NOT MET 09/09/2015 He was late to appointment so PT did not address.    Time 1   Period Months   Status Not Met           PT Long Term Goals - 09/23/15 0947    PT LONG TERM GOAL #1   Title Patient demonstrates / verbalizes proper prosthetic care of new prosthesis & componentry. (Target Date: 10/10/2015)   Baseline PT deferred 09/09/2015 as he is awaiting Medicaid to help with needed finances for new prosthesis.    Time 2   Period Months   Status Deferred   PT LONG TERM GOAL #2   Title Patient tolerates wear of new prosthesis >90% of awake hours without skin issues or limb tenderness.  (Target Date: 10/10/2015)   Baseline PT deferred 09/09/2015 as he is awaiting  Medicaid to help with needed finances for new prosthesis.    Time 2   Period Months   Status Deferred   PT LONG TERM GOAL #3   Title Patient ambulates 500' with cane & prosthesis modified independent.  (Target Date: 10/10/2015)   Time 2   Period Months   Status On-going   PT LONG TERM GOAL #4   Title Patient negotiates ramps,  curbs & stairs (1 rail) with single point cane & prosthesis modified independent.  (Target Date: 10/10/2015)   Time 2   Period Months   Status On-going   PT LONG TERM GOAL #5   Title Patient ambulates 100' carrying laundry basket around furniture with prosthesis only modified indpendent to enable household mobility.  (Target Date: 10/10/2015)   Time 2   Period Months   Status On-going   PT LONG TERM GOAL #6   Title Berg Balance > 45/56 to lower fall risk.  (Target Date: 10/10/2015)   Time 2   Period Months   Status On-going   PT LONG TERM GOAL #7   Title Timed Up & Go <30 seconds with prosthesis only.  (Target Date: 10/10/2015)   Time 2   Period Months   Status On-going               Plan - 09/23/15 1000    Clinical Impression Statement Pt presented with increased instability with gait with single tip cane due to what appears to be related to prosthesis. Jamey Reas, PT, DPT wrote: Patient's prosthetic knee does not keep up with his current gait velocity as current knee is K2 level and he is progressing towards K3-variable gait with increased pace) Pt with multiple instances of LOB. Pt presents with trunk lean to left in order to clear R foot.    Pt will benefit from skilled therapeutic intervention in order to improve on the following deficits Abnormal gait;Decreased activity tolerance;Decreased balance;Decreased mobility;Prosthetic Dependency   Rehab Potential Good   PT Frequency 2x / week  patient to start 1x/wk until recieves new prosthesis   PT Duration Other (comment)  9 weeks (60 days)   PT Treatment/Interventions ADLs/Self Care Home  Management;Gait training;Stair training;Patient/family education;Functional mobility training;Therapeutic activities;Therapeutic exercise;Balance training;Neuromuscular re-education;Prosthetic Training   PT Next Visit Plan Continue gait training with straight cane with quad rubber tip. Monitor prosthesis to observe if adjustments by prothetist have improved pt's gait.   Consulted and Agree with Plan of Care Patient;Family member/caregiver   Family Member Consulted son        Problem List Patient Active Problem List   Diagnosis Date Noted  . Chest pain 06/24/2015  . 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 Gangrene ([place]) 07/22/2014  . PVD (peripheral vascular disease) ([place]) 07/21/2014  . PAD (peripheral artery disease) ([place]) 07/21/2014  . S/P AKA (above knee amputation) ([place]) 07/21/2014  . CAD (coronary artery disease) 07/21/2014  . Dyslipidemia 07/21/2014  . Essential hypertension, benign 07/21/2014    Laney Potash 09/23/2015, 5:13 PM  Laney Potash, [place]  Name: Anish Vana MRN: [id number] Date of Birth: February 02, 1946   Jamey Reas, PT, DPT PT Specializing in [place] 09/24/2015 6:37 AM Phone:  [phone]  Fax:  [phone] [facility] [address] [place], [place] [address]

## 2015-09-30 ENCOUNTER — Encounter: Payer: Self-pay | Admitting: Physical Therapy

## 2015-09-30 ENCOUNTER — Ambulatory Visit: Payer: PPO | Attending: Behavioral Health | Admitting: Physical Therapy

## 2015-09-30 DIAGNOSIS — R29818 Other symptoms and signs involving the nervous system: Secondary | ICD-10-CM | POA: Diagnosis present

## 2015-09-30 DIAGNOSIS — Z89611 Acquired absence of right leg above knee: Secondary | ICD-10-CM | POA: Diagnosis present

## 2015-09-30 DIAGNOSIS — R2681 Unsteadiness on feet: Secondary | ICD-10-CM | POA: Diagnosis present

## 2015-09-30 DIAGNOSIS — Z7409 Other reduced mobility: Secondary | ICD-10-CM | POA: Insufficient documentation

## 2015-09-30 DIAGNOSIS — Z5189 Encounter for other specified aftercare: Secondary | ICD-10-CM | POA: Diagnosis present

## 2015-09-30 DIAGNOSIS — R269 Unspecified abnormalities of gait and mobility: Secondary | ICD-10-CM | POA: Diagnosis not present

## 2015-09-30 DIAGNOSIS — R2689 Other abnormalities of gait and mobility: Secondary | ICD-10-CM

## 2015-09-30 NOTE — Therapy (Signed)
[facility] [address] [place], [place], [address] Phone: [phone]   Fax:  [phone]  Physical Therapy Treatment  Patient Details  Name: Stephen Camacho MRN: [id number] Date of Birth: [birth year] Referring Provider: Blenda Nicely, MD  Encounter Date: 09/30/2015      PT End of Session - 09/30/15 1015    Visit Number 8   Number of Visits 18   Date for PT Re-Evaluation 10/10/15   Authorization Type Medicare G-code and progress report every 10 visits.   PT Start Time 0930   PT Stop Time 1015   PT Time Calculation (min) 45 min   Equipment Utilized During Treatment Gait belt   Activity Tolerance Patient tolerated treatment well   Behavior During Therapy WFL for tasks assessed/performed      Past Medical History  Diagnosis Date  . Hyperlipidemia   . Hypertension   . Diabetes mellitus ([place])   . CAD (coronary artery disease)     a. s/p 2 v CABG in 2012 (LIMA-LAD & SVG-OM); b. cath 07/2012 s/p PCI/DES to ostial LCx and OM  . PAD (peripheral artery disease) ([place])     a. s/p right SFA stent; b. s/p right toe amputation 2011; c. s/p right AKA spring 2016  . Seizures ([place])   . Gangrene of foot ([place])   . Wheezing   . Lung cancer ([place])   . Prostate cancer ([place])   . Stroke [facility])     Past Surgical History  Procedure Laterality Date  . Right aka  07-10-2014  . Left femoral popliteal bypass    . Right lung lobeectomy    . Coronary artery bypass graft      There were no vitals filed for this visit.  Visit Diagnosis:  Abnormality of gait  Balance problems  Impaired functional mobility and activity tolerance  Status post above knee amputation of right lower extremity (HCC)  Unsteadiness      Subjective Assessment - 09/30/15 0939    Subjective (p) No falls. He is working on getting a single point cane with quad tip.   Patient is accompained by: (p) Family member   Currently in Pain? (p) No/denies                          [place] Adult PT Treatment/Exercise - 09/30/15 1145    Transfers   Transfers Sit to Stand;Stand to Sit   Sit to Stand 5: Supervision;With upper extremity assist;From chair/3-in-1   Sit to Stand Details Visual cues/gestures for sequencing;Verbal cues for technique;Tactile cues for placement   Sit to Stand Details (indicate cue type and reason) verbal cues on technique with goal to no touch to stabalize (chairback close for safety).   Stand to Sit 5: Supervision;With upper extremity assist;To chair/3-in-1   Stand to Sit Details (indicate cue type and reason) Tactile cues for sequencing;Verbal cues for technique;Verbal cues for sequencing   Stand to Sit Details PT verbal cues & demo technique without touching item to stabilze   Ambulation/Gait   Ambulation/Gait Yes   Ambulation/Gait Assistance 5: Supervision   Ambulation/Gait Assistance Details verbal cues on posture, step length and prosthetic knee control   Ambulation Distance (Feet) 500 Feet  500' on tile, 100' on compliant w/ objects uneven   Assistive device Prosthesis;Straight cane  single point cane with quad tip   Gait Pattern Step-to pattern;Decreased stance time - right;Decreased stride length;Decreased step length - left;Decreased hip/knee flexion - right;Right  steppage;Decreased trunk rotation;Trunk flexed;Abducted- right   Ambulation Surface Indoor;Level;Unlevel  compliant w/ bean bags under to simulate grass   Stairs Yes   Stairs Assistance 5: Supervision   Stair Management Technique One rail Right;One rail Left;Step to pattern;Forwards;With cane   Number of Stairs 4  3 reps   Ramp 4: Min assist  single point cane & prosthesis   Ramp Details (indicate cue type and reason) verbal cues on technique   Curb 4: Min assist  single point cane & prosthesis   Curb Details (indicate cue type and reason) cues on technique   High Level Balance   High Level Balance Activities Negotitating around  obstacles;Negotiating over obstacles;Head turns;Figure 8 turns   High Level Balance Comments verbal cues on technique to step over obstacles: 2" X 4" and 4" X 2" forward facing and 4" X 6" and 5" X 4" sideways; turns head to scan with single point cane with minA  carrying basket ~10# with SBA   Prosthetics   Prosthetic Care Comments  Working on getting Medicaid to enable new prosthesis. PT instructed benefits of hydraulics in knee unit. Pt reports that skin is WNL and is wearing the prosthesis all awake hours.   Current prosthetic wear tolerance (days/week)  7 days   Current prosthetic wear tolerance (#hours/day)  all awake hours.   Current prosthetic weight-bearing tolerance (hours/day)  tolerated 20 minutes of standing & gait without issues   Residual limb condition  no open areas, normal color, moisture, hair growth and temperature                PT Education - 09/30/15 1015    Education provided Yes   Education Details Balance exercises near counter/table/chair back for support: sit to /from stand pushing up but not touching to stabilize, picking up objects from floor, moving RLE abduction /adduction, flexion/ together, extension /together, repeated with LLE   Person(s) Educated Patient;Child(ren)   Methods Explanation;Demonstration;Verbal cues   Comprehension Verbalized understanding;Returned demonstration;Verbal cues required;Tactile cues required;Need further instruction          PT Short Term Goals - 09/23/15 0947    PT SHORT TERM GOAL #1   Title Patient ambulates 200' with prosthesis and single point cane with supervision. (Target Date: 09/11/2015)   Baseline MET 09/09/2015 with [facility]   Time 1   Period Months   Status Achieved   PT SHORT TERM GOAL #2   Title Patient negotiates ramps & curbs with single point cane & prosthesis with supervision. . (Target Date: 09/11/2015)   Baseline Partially MET 09/09/2015 Patient negotiates ramps & curbs with contact assist with [facility].     Time 1   Period Months   Status Partially Met   PT SHORT TERM GOAL #3   Title Patient ambulates 63' with prosthesis only with minimal assist. . (Target Date: 09/11/2015)   Baseline NOT MET 09/09/2015 He was late to appointment so PT did not address.    Time 1   Period Months   Status Not Met           PT Long Term Goals - 09/23/15 0947    PT LONG TERM GOAL #1   Title Patient demonstrates / verbalizes proper prosthetic care of new prosthesis & componentry. (Target Date: 10/10/2015)   Baseline PT deferred 09/09/2015 as he is awaiting Medicaid to help with needed finances for new prosthesis.    Time 2   Period Months   Status Deferred   PT LONG TERM GOAL #2  Title Patient tolerates wear of new prosthesis >90% of awake hours without skin issues or limb tenderness.  (Target Date: 10/10/2015)   Baseline PT deferred 09/09/2015 as he is awaiting Medicaid to help with needed finances for new prosthesis.    Time 2   Period Months   Status Deferred   PT LONG TERM GOAL #3   Title Patient ambulates 500' with cane & prosthesis modified independent.  (Target Date: 10/10/2015)   Time 2   Period Months   Status On-going   PT LONG TERM GOAL #4   Title Patient negotiates ramps, curbs & stairs (1 rail) with single point cane & prosthesis modified independent.  (Target Date: 10/10/2015)   Time 2   Period Months   Status On-going   PT LONG TERM GOAL #5   Title Patient ambulates 100' carrying laundry basket around furniture with prosthesis only modified indpendent to enable household mobility.  (Target Date: 10/10/2015)   Time 2   Period Months   Status On-going   PT LONG TERM GOAL #6   Title Berg Balance > 45/56 to lower fall risk.  (Target Date: 10/10/2015)   Time 2   Period Months   Status On-going   PT LONG TERM GOAL #7   Title Timed Up & Go <30 seconds with prosthesis only.  (Target Date: 10/10/2015)   Time 2   Period Months   Status On-going               Plan -  09/30/15 1015    Clinical Impression Statement Patient had less balance losses today since prosthetist increased extension assist. He appears to understand HEP for balance to decrease UE support for stabalization.    Pt will benefit from skilled therapeutic intervention in order to improve on the following deficits Abnormal gait;Decreased activity tolerance;Decreased balance;Decreased mobility;Prosthetic Dependency   Rehab Potential Good   PT Frequency 2x / week  patient to start 1x/wk until recieves new prosthesis   PT Duration Other (comment)  9 weeks (60 days)   PT Treatment/Interventions ADLs/Self Care Home Management;Gait training;Stair training;Patient/family education;Functional mobility training;Therapeutic activities;Therapeutic exercise;Balance training;Neuromuscular re-education;Prosthetic Training   PT Next Visit Plan Continue gait training with straight cane with four point rubber tip. Monitor prosthesis to observe if adjustments by prothetist have normalized pt's gait.   Consulted and Agree with Plan of Care Patient;Family member/caregiver   Family Member Consulted son        Problem List Patient Active Problem List   Diagnosis Date Noted  . Chest pain 06/24/2015  . 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 Gangrene ([place]) 07/22/2014  . PVD (peripheral vascular disease) ([place]) 07/21/2014  . PAD (peripheral artery disease) ([place]) 07/21/2014  . S/P AKA (above knee amputation) ([place]) 07/21/2014  . CAD (coronary artery disease) 07/21/2014  . Dyslipidemia 07/21/2014  . Essential hypertension, benign 07/21/2014    Jamey Reas PT, DPT 09/30/2015, 2:04 PM  [facility] [address] [place], [place], [address] Phone: [phone]   Fax:  [phone]  Name: Gunther Zawadzki MRN: [id number] Date of Birth: 06-18-1946

## 2015-10-07 ENCOUNTER — Ambulatory Visit: Payer: PPO

## 2015-10-07 DIAGNOSIS — Z7409 Other reduced mobility: Secondary | ICD-10-CM

## 2015-10-07 DIAGNOSIS — R269 Unspecified abnormalities of gait and mobility: Secondary | ICD-10-CM

## 2015-10-07 DIAGNOSIS — R2681 Unsteadiness on feet: Secondary | ICD-10-CM

## 2015-10-07 DIAGNOSIS — Z89611 Acquired absence of right leg above knee: Secondary | ICD-10-CM

## 2015-10-07 DIAGNOSIS — R2689 Other abnormalities of gait and mobility: Secondary | ICD-10-CM

## 2015-10-08 NOTE — Therapy (Signed)
Stephen Camacho [address] [place], [place], [address] Phone: [phone]   Fax:  [phone]  Physical Therapy Treatment  Patient Details  Name: Stephen Camacho MRN: [id number] Date of Birth: Nov 15, 1945 Referring Provider: Blenda Nicely, MD  Encounter Date: 10/07/2015      PT End of Session - 10/07/15 1649    Visit Number 9   Number of Visits 18   Date for PT Re-Evaluation 10/10/15   Authorization Type Medicare G-code and progress report every 10 visits.   PT Start Time 0930   PT Stop Time 1015   PT Time Calculation (min) 45 min   Equipment Utilized During Treatment Gait belt   Activity Tolerance Patient tolerated treatment well   Behavior During Therapy WFL for tasks assessed/performed      Past Medical History  Diagnosis Date  . Hyperlipidemia   . Hypertension   . Diabetes mellitus ([place])   . CAD (coronary artery disease)     a. s/p 2 v CABG in 2012 (LIMA-LAD & SVG-OM); b. cath 07/2012 s/p PCI/DES to ostial LCx and OM  . PAD (peripheral artery disease) ([place])     a. s/p right SFA stent; b. s/p right toe amputation 2011; c. s/p right AKA spring 2016  . Seizures ([place])   . Gangrene of foot ([place])   . Wheezing   . Lung cancer ([place])   . Prostate cancer ([place])   . Stroke [facility])     Past Surgical History  Procedure Laterality Date  . Right aka  07-10-2014  . Left femoral popliteal bypass    . Right lung lobeectomy    . Coronary artery bypass graft      There were no vitals filed for this visit.  Visit Diagnosis:  Abnormality of gait  Balance problems  Impaired functional mobility and activity tolerance  Status post above knee amputation of right lower extremity (HCC)  Unsteadiness      Subjective Assessment - 10/07/15 0946    Subjective Went to [employer] and couldn't find the cane with the tripod tip.   Currently in Pain? No/denies   Multiple Pain Sites No                                  PT Education - 10/08/15 1648    Education provided Yes   Education Details continue to try to get cane for practice at home   Person(s) Educated Patient;Child(ren)   Methods Explanation;Verbal cues   Comprehension Verbalized understanding          PT Short Term Goals - 09/23/15 0947    PT SHORT TERM GOAL #1   Title Patient ambulates 200' with prosthesis and single point cane with supervision. (Target Date: 09/11/2015)   Baseline MET 09/09/2015 with [facility]   Time 1   Period Months   Status Achieved   PT SHORT TERM GOAL #2   Title Patient negotiates ramps & curbs with single point cane & prosthesis with supervision. . (Target Date: 09/11/2015)   Baseline Partially MET 09/09/2015 Patient negotiates ramps & curbs with contact assist with [facility].    Time 1   Period Months   Status Partially Met   PT SHORT TERM GOAL #3   Title Patient ambulates 68' with prosthesis only with minimal assist. . (Target Date: 09/11/2015)   Baseline NOT MET 09/09/2015 He was late to appointment so PT did not address.  Time 1   Period Months   Status Not Met           PT Long Term Goals - 09/23/15 0947    PT LONG TERM GOAL #1   Title Patient demonstrates / verbalizes proper prosthetic care of new prosthesis & componentry. (Target Date: 10/10/2015)   Baseline PT deferred 09/09/2015 as he is awaiting Medicaid to help with needed finances for new prosthesis.    Time 2   Period Months   Status Deferred   PT LONG TERM GOAL #2   Title Patient tolerates wear of new prosthesis >90% of awake hours without skin issues or limb tenderness.  (Target Date: 10/10/2015)   Baseline PT deferred 09/09/2015 as he is awaiting Medicaid to help with needed finances for new prosthesis.    Time 2   Period Months   Status Deferred   PT LONG TERM GOAL #3   Title Patient ambulates 500' with cane & prosthesis modified independent.  (Target Date: 10/10/2015)   Time 2    Period Months   Status On-going   PT LONG TERM GOAL #4   Title Patient negotiates ramps, curbs & stairs (1 rail) with single point cane & prosthesis modified independent.  (Target Date: 10/10/2015)   Time 2   Period Months   Status On-going   PT LONG TERM GOAL #5   Title Patient ambulates 100' carrying laundry basket around furniture with prosthesis only modified indpendent to enable household mobility.  (Target Date: 10/10/2015)   Time 2   Period Months   Status On-going   PT LONG TERM GOAL #6   Title Berg Balance > 45/56 to lower fall risk.  (Target Date: 10/10/2015)   Time 2   Period Months   Status On-going   PT LONG TERM GOAL #7   Title Timed Up & Go <30 seconds with prosthesis only.  (Target Date: 10/10/2015)   Time 2   Period Months   Status On-going               Plan - 10/08/15 1650    Clinical Impression Statement Progressing with using tripod cane, but has not been able to get for home yet.  Will continue to benefit from skilled PT to progress to goals   PT Next Visit Plan Continue gait training with straight cane with four point rubber tip. Check fit due to rotation noted this session   Consulted and Agree with Plan of Care Patient;Family member/caregiver   Family Member Consulted son        Problem List Patient Active Problem List   Diagnosis Date Noted  . Chest pain 06/24/2015  . 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 Gangrene ([place]) 07/22/2014  . PVD (peripheral vascular disease) ([place]) 07/21/2014  . PAD (peripheral artery disease) ([place]) 07/21/2014  . S/P AKA (above knee amputation) ([place]) 07/21/2014  . CAD (coronary artery disease) 07/21/2014  . Dyslipidemia 07/21/2014  . Essential hypertension, benign 07/21/2014    Reginia Naas 10/08/2015, 4:53 PM  Magda Kiel, PT 10/08/2015   [facility] [address] [place], [place], [address] Phone:  [phone]   Fax:  [phone]  Name: Sandon Yoho MRN: [id number] Date of Birth: 09-Nov-1945

## 2015-10-14 ENCOUNTER — Ambulatory Visit: Payer: PPO | Admitting: Physical Therapy

## 2015-10-14 DIAGNOSIS — Z4789 Encounter for other orthopedic aftercare: Secondary | ICD-10-CM

## 2015-10-14 DIAGNOSIS — R2681 Unsteadiness on feet: Secondary | ICD-10-CM

## 2015-10-14 DIAGNOSIS — R269 Unspecified abnormalities of gait and mobility: Secondary | ICD-10-CM | POA: Diagnosis not present

## 2015-10-14 DIAGNOSIS — R2689 Other abnormalities of gait and mobility: Secondary | ICD-10-CM

## 2015-10-14 DIAGNOSIS — Z89611 Acquired absence of right leg above knee: Secondary | ICD-10-CM

## 2015-10-14 DIAGNOSIS — Z7409 Other reduced mobility: Secondary | ICD-10-CM

## 2015-10-14 NOTE — Therapy (Signed)
[facility] [address] [place], [place], [address] Phone: [phone]   Fax:  [phone]  Physical Therapy Treatment  Patient Details  Name: Stephen Camacho MRN: [id number] Date of Birth: 03-Apr-1946 Referring Provider: Blenda Nicely, MD  Encounter Date: 10/14/2015      PT End of Session - 10/14/15 1015    Visit Number 10   Number of Visits 18   Date for PT Re-Evaluation 10/10/15   Authorization Type Medicare G-code and progress report every 10 visits.   PT Start Time 0935   PT Stop Time 1015   PT Time Calculation (min) 40 min   Equipment Utilized During Treatment Gait belt   Activity Tolerance Patient tolerated treatment well   Behavior During Therapy WFL for tasks assessed/performed      Past Medical History  Diagnosis Date  . Hyperlipidemia   . Hypertension   . Diabetes mellitus ([place])   . CAD (coronary artery disease)     a. s/p 2 v CABG in 2012 (LIMA-LAD & SVG-OM); b. cath 07/2012 s/p PCI/DES to ostial LCx and OM  . PAD (peripheral artery disease) ([place])     a. s/p right SFA stent; b. s/p right toe amputation 2011; c. s/p right AKA spring 2016  . Seizures ([place])   . Gangrene of foot ([place])   . Wheezing   . Lung cancer ([place])   . Prostate cancer ([place])   . Stroke [facility])     Past Surgical History  Procedure Laterality Date  . Right aka  07-10-2014  . Left femoral popliteal bypass    . Right lung lobeectomy    . Coronary artery bypass graft      There were no vitals filed for this visit.  Visit Diagnosis:  Abnormality of gait  Balance problems  Impaired functional mobility and activity tolerance  Status post above knee amputation of right lower extremity ([place])  Unsteadiness  Encounter for prosthetic gait training      Subjective Assessment - 10/14/15 0935    Subjective He bought tip & cane but forgot to bring it today. Denies falls.    Patient is accompained by: Family member   Currently in Pain? No/denies                         [facility] Adult PT Treatment/Exercise - 10/14/15 0935    Transfers   Transfers Sit to Stand;Stand to Sit   Sit to Stand 5: Supervision;With upper extremity assist;From chair/3-in-1   Sit to Stand Details Visual cues/gestures for sequencing;Verbal cues for technique;Tactile cues for placement   Stand to Sit 5: Supervision;With upper extremity assist;To chair/3-in-1   Stand to Sit Details (indicate cue type and reason) Tactile cues for sequencing;Verbal cues for technique;Verbal cues for sequencing   Ambulation/Gait   Ambulation/Gait Yes   Ambulation/Gait Assistance 5: Supervision   Ambulation/Gait Assistance Details cues on increasing prosthesis stance duration & right LE step length; posture   Ambulation Distance (Feet) 500 Feet  500' X 2   Assistive device Prosthesis;Straight cane  single point cane with quad tip   Gait Pattern Step-to pattern;Decreased stance time - right;Decreased stride length;Decreased step length - left;Decreased hip/knee flexion - right;Right steppage;Decreased trunk rotation;Trunk flexed;Abducted- right   Ambulation Surface Indoor;Level   Stairs Yes   Stairs Assistance 5: Supervision   Stair Management Technique One rail Right;One rail Left;Step to pattern;Forwards;With cane   Number of Stairs 4  3 reps   Ramp  5: Supervision  single point cane & prosthesis   Ramp Details (indicate cue type and reason) demo, verbal cues on proper step length and wt shift over prosthesis in stance.    Curb 5: Supervision  single point cane & prosthesis   Curb Details (indicate cue type and reason) cues on step thru with 2nd LE for balance   Standardized Balance Assessment   Standardized Balance Assessment Berg Balance Test;Timed Up and Go Test   Berg Balance Test   Sit to Stand Able to stand  independently using hands   Standing Unsupported Able to stand safely 2 minutes   Sitting with Back Unsupported but  Feet Supported on Floor or Stool Able to sit safely and securely 2 minutes   Stand to Sit Controls descent by using hands   Transfers Able to transfer safely, minor use of hands   Standing Unsupported with Eyes Closed Able to stand 10 seconds with supervision   Standing Ubsupported with Feet Together Able to place feet together independently and stand for 1 minute with supervision   From Standing, Reach Forward with Outstretched Arm Can reach forward >12 cm safely (5")   From Standing Position, Pick up Object from Floor Able to pick up shoe, needs supervision   From Standing Position, Turn to Look Behind Over each Shoulder Looks behind one side only/other side shows less weight shift   Turn 360 Degrees Able to turn 360 degrees safely one side only in 4 seconds or less   Standing Unsupported, Alternately Place Feet on Step/Stool Able to complete 4 steps without aid or supervision   Standing Unsupported, One Foot in Front Able to take small step independently and hold 30 seconds   Standing on One Leg Tries to lift leg/unable to hold 3 seconds but remains standing independently   Total Score 41   Timed Up and Go Test   TUG Normal TUG   Normal TUG (seconds) 38.98   High Level Balance   High Level Balance Activities Negotitating around obstacles;Negotiating over obstacles;Head turns;Figure 8 turns;Turns   High Level Balance Comments verbal cues on technique to step over obstacles: 2" X 4" and 4" X 2" forward facing and 4" X 6" and 5" X 4" sideways; turns head to scan with single point cane with minA  carrying basket ~10# with SBA   Self-Care   Self-Care Posture   Posture verbal cues with demo on position of prosthesis with increased wt when standing still for improved balance & resting right LE.   Therapeutic Activites    Therapeutic Activities Lifting   Lifting picking up objects from floor with PT demo, cueing technique with prosthesis.    Prosthetics   Prosthetic Care Comments  Working on  getting Medicaid to enable new prosthesis. PT instructed benefits of hydraulics in knee unit. Pt reports that skin is WNL and is wearing the prosthesis all awake hours.   Current prosthetic wear tolerance (days/week)  7 days   Current prosthetic wear tolerance (#hours/day)  all awake hours.   Current prosthetic weight-bearing tolerance (hours/day)  tolerated 20 minutes of standing & gait without issues   Residual limb condition  no open areas, normal color, moisture, hair growth and temperature                  PT Short Term Goals - 09/23/15 0947    PT SHORT TERM GOAL #1   Title Patient ambulates 200' with prosthesis and single point cane with supervision. (Target Date: 09/11/2015)  Baseline MET 09/09/2015 with SBQC   Time 1   Period Months   Status Achieved   PT SHORT TERM GOAL #2   Title Patient negotiates ramps & curbs with single point cane & prosthesis with supervision. . (Target Date: 09/11/2015)   Baseline Partially MET 09/09/2015 Patient negotiates ramps & curbs with contact assist with [facility]    Time 1   Period Months   Status Partially Met   PT SHORT TERM GOAL #3   Title Patient ambulates 2' with prosthesis only with minimal assist. . (Target Date: 09/11/2015)   Baseline NOT MET 09/09/2015 He was late to appointment so PT did not address.    Time 1   Period Months   Status Not Met           PT Long Term Goals - 10/14/15 1015    PT LONG TERM GOAL #1   Title Patient demonstrates / verbalizes proper prosthetic care of new prosthesis & componentry. (Target Date: 10/10/2015)   Baseline PT deferred 09/09/2015 as he is awaiting Medicaid to help with needed finances for new prosthesis.    Time 2   Period Months   Status Deferred   PT LONG TERM GOAL #2   Title Patient tolerates wear of new prosthesis >90% of awake hours without skin issues or limb tenderness.  (Target Date: 10/10/2015)   Baseline PT deferred 09/09/2015 as he is awaiting Medicaid to help with  needed finances for new prosthesis.    Time 2   Period Months   Status Deferred   PT LONG TERM GOAL #3   Title Patient ambulates 500' with cane & prosthesis modified independent.  (NEW Target Date: 11/13/2015)   Baseline NOT MET 10/13/2016  Patient ambulates 500' with single point cane with  quad tip with supervision.    Time 2   Period Months   Status On-going   PT LONG TERM GOAL #4   Title Patient negotiates ramps, curbs & stairs (1 rail) with single point cane & prosthesis modified independent. (NEW Target Date: 11/13/2015)   Baseline NOT MET 10/13/2016  Patient negotiates ramps, curbs and stairs (1 rail) with single point cane with supervision.    Time 2   Period Months   Status On-going   PT LONG TERM GOAL #5   Title Patient ambulates 100' carrying laundry basket around furniture with prosthesis only modified indpendent to enable household mobility. (NEW Target Date: 11/13/2015)   Baseline NOT MET 10/14/2015 PT working on lifting items and reaching without balance loss. Goal does not appear safe at this time with current prosthesis.   Time 2   Period Months   Status Deferred   PT LONG TERM GOAL #6   Title Berg Balance > 45/56 to lower fall risk.  (NEW Target Date: 11/13/2015)   Baseline NOT MET 10/13/2016   Merrilee Jansky Balance 41/56   Time 2   Period Months   Status On-going   PT LONG TERM GOAL #7   Title Timed Up & Go <30 seconds with prosthesis & single point cane. (NEW Target Date: 11/13/2015)   Baseline NOT MET 10/13/2016   TUG with single point cane 38.98sec with supervision   Time 2   Period Months   Status Revised               Plan - 10/14/15 1015    Clinical Impression Statement Patient has progressed towards LTGs but not met them due to only attending 1x/wk until he recieves a new prosthesis.  He is waiting approval for Medicaid as secondary insurance to help cost of prosthesis. Patient should meet remaining LTGs in 4 weeks then PT will place on hold until he recieves a  new prosthesis.    Pt will benefit from skilled therapeutic intervention in order to improve on the following deficits Abnormal gait;Decreased activity tolerance;Decreased balance;Decreased mobility;Prosthetic Dependency   Rehab Potential Good   PT Frequency 1x / week   PT Duration 4 weeks   PT Treatment/Interventions ADLs/Self Care Home Management;Gait training;Stair training;Patient/family education;Functional mobility training;Therapeutic activities;Therapeutic exercise;Balance training;Neuromuscular re-education;Prosthetic Training   PT Next Visit Plan Continue gait training with straight cane with four point rubber tip.   Consulted and Agree with Plan of Care Patient;Family member/caregiver   Family Member Consulted son          G-Codes - 10-31-2015 1015    Functional Assessment Tool Used Berg Balance 41/56; TUG 38.98sec with single point cane with SBA   Functional Limitation Mobility: Walking and moving around   Mobility: Walking and Moving Around Current Status [phone]) At least 40 percent but less than 60 percent impaired, limited or restricted   Mobility: Walking and Moving Around Goal Status [phone]) At least 20 percent but less than 40 percent impaired, limited or restricted      Problem List Patient Active Problem List   Diagnosis Date Noted  . Chest pain 06/24/2015  . 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 Gangrene ([place]) 07/22/2014  . PVD (peripheral vascular disease) ([place]) 07/21/2014  . PAD (peripheral artery disease) ([place]) 07/21/2014  . S/P AKA (above knee amputation) ([place]) 07/21/2014  . CAD (coronary artery disease) 07/21/2014  . Dyslipidemia 07/21/2014  . Essential hypertension, benign 07/21/2014    Jamey Reas PT, DPT 2015-10-31, 1:41 PM  [facility] [address] [place], [place], [address] Phone: [phone]   Fax:  [phone]  Name: Stephen Camacho MRN:  [id number] Date of Birth: 04-18-1946

## 2015-10-21 ENCOUNTER — Encounter: Payer: Self-pay | Admitting: Physical Therapy

## 2015-10-21 ENCOUNTER — Ambulatory Visit: Payer: PPO | Admitting: Physical Therapy

## 2015-10-21 DIAGNOSIS — R2689 Other abnormalities of gait and mobility: Secondary | ICD-10-CM

## 2015-10-21 DIAGNOSIS — Z7409 Other reduced mobility: Secondary | ICD-10-CM

## 2015-10-21 DIAGNOSIS — R269 Unspecified abnormalities of gait and mobility: Secondary | ICD-10-CM | POA: Diagnosis not present

## 2015-10-21 DIAGNOSIS — Z4789 Encounter for other orthopedic aftercare: Secondary | ICD-10-CM

## 2015-10-21 DIAGNOSIS — Z89611 Acquired absence of right leg above knee: Secondary | ICD-10-CM

## 2015-10-21 DIAGNOSIS — R2681 Unsteadiness on feet: Secondary | ICD-10-CM

## 2015-10-21 NOTE — Therapy (Signed)
[facility] [address] [place], [place], [address] Phone: [phone]   Fax:  [phone]  Physical Therapy Treatment  Patient Details  Name: Stephen Camacho MRN: [id number] Date of Birth: [birth year] Referring Provider: Blenda Nicely, MD  Encounter Date: 10/21/2015      PT End of Session - 10/21/15 1100    Visit Number 11   Number of Visits 18   Date for PT Re-Evaluation 10/10/15   Authorization Type Medicare G-code and progress report every 10 visits.   PT Start Time 1015   PT Stop Time 1100   PT Time Calculation (min) 45 min   Equipment Utilized During Treatment Gait belt   Activity Tolerance Patient tolerated treatment well   Behavior During Therapy WFL for tasks assessed/performed      Past Medical History  Diagnosis Date  . Hyperlipidemia   . Hypertension   . Diabetes mellitus ([place])   . CAD (coronary artery disease)     a. s/p 2 v CABG in 2012 (LIMA-LAD & SVG-OM); b. cath 07/2012 s/p PCI/DES to ostial LCx and OM  . PAD (peripheral artery disease) ([place])     a. s/p right SFA stent; b. s/p right toe amputation 2011; c. s/p right AKA spring 2016  . Seizures ([place])   . Gangrene of foot ([place])   . Wheezing   . Lung cancer ([place])   . Prostate cancer ([place])   . Stroke [facility])     Past Surgical History  Procedure Laterality Date  . Right aka  07-10-2014  . Left femoral popliteal bypass    . Right lung lobeectomy    . Coronary artery bypass graft      There were no vitals filed for this visit.  Visit Diagnosis:  Abnormality of gait  Balance problems  Impaired functional mobility and activity tolerance  Status post above knee amputation of right lower extremity ([place])  Unsteadiness  Encounter for prosthetic gait training      Subjective Assessment - 10/21/15 1016    Subjective No falls or issues. Daughter bought quad tip at [employer].    Currently in Pain? No/denies                          [facility] Adult PT Treatment/Exercise - 10/21/15 1015    Transfers   Transfers Sit to Stand;Stand to Sit   Sit to Stand 5: Supervision;With upper extremity assist;From chair/3-in-1   Sit to Stand Details Visual cues/gestures for sequencing;Verbal cues for technique;Tactile cues for placement   Sit to Stand Details (indicate cue type and reason) cues on prosthetic knee engagement   Stand to Sit 5: Supervision;With upper extremity assist;To chair/3-in-1   Stand to Sit Details (indicate cue type and reason) Tactile cues for sequencing;Verbal cues for technique;Verbal cues for sequencing   Stand to Sit Details verbal cues on prosthetic knee control   Ambulation/Gait   Ambulation/Gait Yes   Ambulation/Gait Assistance 5: Supervision   Ambulation/Gait Assistance Details verbal & visual cues on prosthesis advancement without steppage gait, proper step length with step thru pattern and posture. Worked on carrying cup of water around table   Ambulation Distance (Feet) 500 Feet  500' X 2   Assistive device Prosthesis;Straight cane  single point cane with quad tip   Gait Pattern Step-to pattern;Decreased stance time - right;Decreased stride length;Decreased step length - left;Decreased hip/knee flexion - right;Right steppage;Decreased trunk rotation;Trunk flexed;Abducted- right   Ambulation Surface Indoor;Level  Stairs Yes   Stairs Assistance 5: Supervision   Stair Management Technique One rail Right;One rail Left;Step to pattern;Forwards;With cane   Number of Stairs 4  3 reps   Ramp 5: Supervision  single point cane & prosthesis   Ramp Details (indicate cue type and reason) cues on step length, wt shift & posture   Curb 5: Supervision  single point cane & prosthesis   Curb Details (indicate cue type and reason) sequence, step thru with second LE for stabilization.    Standardized Balance Assessment   Standardized Balance Assessment --   Berg Balance Test    Sit to Stand --   Standing Unsupported --   Sitting with Back Unsupported but Feet Supported on Floor or Stool --   Stand to Sit --   Transfers --   Standing Unsupported with Eyes Closed --   Standing Ubsupported with Feet Together --   From Standing, Reach Forward with Outstretched Arm --   From Standing Position, Pick up Object from Floor --   From Standing Position, Turn to Look Behind Over each Shoulder --   Turn 360 Degrees --   Standing Unsupported, Alternately Place Feet on Step/Stool --   Standing Unsupported, One Foot in Front --   Standing on One Leg --   Total Score --   Timed Up and Go Test   TUG --   Normal TUG (seconds) --   High Level Balance   High Level Balance Activities Turns;Head turns;Negotitating around obstacles;Negotiating over obstacles   High Level Balance Comments verbal cues on technique to step over obstacles: 2" X 4" and 4" X 2" forward facing and 4" X 6" and 5" X 4" sideways; turns head to scan with single point cane with minA  carrying basket ~10# with SBA   Self-Care   Self-Care Posture   Posture verbal cues with demo on position of prosthesis with increased wt when standing still for improved balance & resting right LE.   Therapeutic Activites    Therapeutic Activities --   [facility] is generating a quote for new prosthesis to submit with Medicaid application   Current prosthetic wear tolerance (days/week)  7 days   Current prosthetic wear tolerance (#hours/day)  all awake hours.   Current prosthetic weight-bearing tolerance (hours/day)  tolerated 20 minutes of standing & gait without issues   Residual limb condition  no open areas, normal color, moisture, hair growth and temperature   Education Provided Other (comment)  tightening suspension strap if starts catching foot   Person(s) Educated Patient;Child(ren)   Education Method Explanation   Education Method Verbalized understanding                 PT Education - 10/21/15 1100    Education provided Yes   Education Details cane design & quad tip   Person(s) Educated Patient;Child(ren)   Methods Explanation;Demonstration;Verbal cues   Comprehension Verbalized understanding          PT Short Term Goals - 09/23/15 0947    PT SHORT TERM GOAL #1   Title Patient ambulates 200' with prosthesis and single point cane with supervision. (Target Date: 09/11/2015)   Baseline MET 09/09/2015 with SBQC   Time 1   Period Months   Status Achieved   PT SHORT TERM GOAL #2   Title Patient negotiates ramps & curbs with single point cane & prosthesis with supervision. . (Target Date: 09/11/2015)   Baseline  Partially MET 09/09/2015 Patient negotiates ramps & curbs with contact assist with [facility].    Time 1   Period Months   Status Partially Met   PT SHORT TERM GOAL #3   Title Patient ambulates 80' with prosthesis only with minimal assist. . (Target Date: 09/11/2015)   Baseline NOT MET 09/09/2015 He was late to appointment so PT did not address.    Time 1   Period Months   Status Not Met           PT Long Term Goals - 10/14/15 1015    PT LONG TERM GOAL #1   Title Patient demonstrates / verbalizes proper prosthetic care of new prosthesis & componentry. (Target Date: 10/10/2015)   Baseline PT deferred 09/09/2015 as he is awaiting Medicaid to help with needed finances for new prosthesis.    Time 2   Period Months   Status Deferred   PT LONG TERM GOAL #2   Title Patient tolerates wear of new prosthesis >90% of awake hours without skin issues or limb tenderness.  (Target Date: 10/10/2015)   Baseline PT deferred 09/09/2015 as he is awaiting Medicaid to help with needed finances for new prosthesis.    Time 2   Period Months   Status Deferred   PT LONG TERM GOAL #3   Title Patient ambulates 500' with cane & prosthesis modified independent.  (NEW Target Date: 11/13/2015)   Baseline NOT MET 10/13/2016  Patient ambulates 500'  with single point cane with  quad tip with supervision.    Time 2   Period Months   Status On-going   PT LONG TERM GOAL #4   Title Patient negotiates ramps, curbs & stairs (1 rail) with single point cane & prosthesis modified independent. (NEW Target Date: 11/13/2015)   Baseline NOT MET 10/13/2016  Patient negotiates ramps, curbs and stairs (1 rail) with single point cane with supervision.    Time 2   Period Months   Status On-going   PT LONG TERM GOAL #5   Title Patient ambulates 100' carrying laundry basket around furniture with prosthesis only modified indpendent to enable household mobility. (NEW Target Date: 11/13/2015)   Baseline NOT MET 10/14/2015 PT working on lifting items and reaching without balance loss. Goal does not appear safe at this time with current prosthesis.   Time 2   Period Months   Status Deferred   PT LONG TERM GOAL #6   Title Berg Balance > 45/56 to lower fall risk.  (NEW Target Date: 11/13/2015)   Baseline NOT MET 10/13/2016   Merrilee Jansky Balance 41/56   Time 2   Period Months   Status On-going   PT LONG TERM GOAL #7   Title Timed Up & Go <30 seconds with prosthesis & single point cane. (NEW Target Date: 11/13/2015)   Baseline NOT MET 10/13/2016   TUG with single point cane 38.98sec with supervision   Time 2   Period Months   Status Revised               Plan - 10/21/15 1100    Clinical Impression Statement Patient appears to have potential to ambulate at full community level with variable cadence if he has the prosthetic knee to support variable cadence. PT recommends seal-in suction ring on silicon liner, Total knee (multi-axial hydraulic that locks with heel contact for stance & unlocks with toe pressure for swing) and K3 dynamic resonse foot. Patient improved stablity & gait with instruction on proper step length &  wt shift.    Pt will benefit from skilled therapeutic intervention in order to improve on the following deficits Abnormal gait;Decreased  activity tolerance;Decreased balance;Decreased mobility;Prosthetic Dependency   Rehab Potential Good   PT Frequency 1x / week   PT Duration 4 weeks   PT Treatment/Interventions ADLs/Self Care Home Management;Gait training;Stair training;Patient/family education;Functional mobility training;Therapeutic activities;Therapeutic exercise;Balance training;Neuromuscular re-education;Prosthetic Training   PT Next Visit Plan Continue gait training with straight cane with four point rubber tip.   Consulted and Agree with Plan of Care Patient;Family member/caregiver   Family Member Consulted dtr        Problem List Patient Active Problem List   Diagnosis Date Noted  . Chest pain 06/24/2015  . 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 Gangrene ([place]) 07/22/2014  . PVD (peripheral vascular disease) ([place]) 07/21/2014  . PAD (peripheral artery disease) ([place]) 07/21/2014  . S/P AKA (above knee amputation) ([place]) 07/21/2014  . CAD (coronary artery disease) 07/21/2014  . Dyslipidemia 07/21/2014  . Essential hypertension, benign 07/21/2014    Jamey Reas PT, DPT 10/21/2015, 5:15 PM  [facility] [address] [place], [place], [address] Phone: [phone]   Fax:  [phone]  Name: Stephen Camacho MRN: [id number] Date of Birth: 1946-05-24

## 2015-10-27 ENCOUNTER — Encounter: Payer: Self-pay | Admitting: Emergency Medicine

## 2015-10-27 ENCOUNTER — Emergency Department: Payer: PPO

## 2015-10-27 ENCOUNTER — Emergency Department
Admission: EM | Admit: 2015-10-27 | Discharge: 2015-10-27 | Disposition: A | Discharge status: Home / Self Care | Payer: PPO | Attending: Emergency Medicine | Admitting: Emergency Medicine

## 2015-10-27 DIAGNOSIS — Z79899 Other long term (current) drug therapy: Secondary | ICD-10-CM | POA: Diagnosis not present

## 2015-10-27 DIAGNOSIS — I10 Essential (primary) hypertension: Secondary | ICD-10-CM | POA: Diagnosis not present

## 2015-10-27 DIAGNOSIS — Z794 Long term (current) use of insulin: Secondary | ICD-10-CM | POA: Diagnosis not present

## 2015-10-27 DIAGNOSIS — R42 Dizziness and giddiness: Secondary | ICD-10-CM | POA: Diagnosis present

## 2015-10-27 DIAGNOSIS — Z7984 Long term (current) use of oral hypoglycemic drugs: Secondary | ICD-10-CM | POA: Diagnosis not present

## 2015-10-27 DIAGNOSIS — E1151 Type 2 diabetes mellitus with diabetic peripheral angiopathy without gangrene: Secondary | ICD-10-CM | POA: Insufficient documentation

## 2015-10-27 DIAGNOSIS — H81399 Other peripheral vertigo, unspecified ear: Secondary | ICD-10-CM | POA: Insufficient documentation

## 2015-10-27 DIAGNOSIS — Z7982 Long term (current) use of aspirin: Secondary | ICD-10-CM | POA: Diagnosis not present

## 2015-10-27 LAB — BASIC METABOLIC PANEL
Anion gap: 6 (ref 5–15)
BUN: 18 mg/dL (ref 6–20)
CALCIUM: 9.5 mg/dL (ref 8.9–10.3)
CO2: 25 mmol/L (ref 22–32)
CREATININE: 0.74 mg/dL (ref 0.61–1.24)
Chloride: 106 mmol/L (ref 101–111)
GFR calc Af Amer: >60 mL/min (ref >60)
GFR calc non Af Amer: >60 mL/min (ref >60)
GLUCOSE: 149 mg/dL — AB (ref 65–99)
Potassium: 4.3 mmol/L (ref 3.5–5.1)
Sodium: 137 mmol/L (ref 135–145)

## 2015-10-27 LAB — CBC
HCT: 34.7 % — ABNORMAL LOW (ref 40.0–52.0)
Hemoglobin: 11 g/dL — ABNORMAL LOW (ref 13.0–18.0)
MCH: 25.1 pg — ABNORMAL LOW (ref 26.0–34.0)
MCHC: 31.6 g/dL — ABNORMAL LOW (ref 32.0–36.0)
MCV: 79.4 fL — ABNORMAL LOW (ref 80.0–100.0)
PLATELETS: 202 10*3/uL (ref 150–440)
RBC: 4.38 MIL/uL — AB (ref 4.40–5.90)
RDW: 17.2 % — AB (ref 11.5–14.5)
WBC: 4.9 10*3/uL (ref 3.8–10.6)

## 2015-10-27 LAB — GLUCOSE, CAPILLARY: GLUCOSE-CAPILLARY: 140 mg/dL — AB (ref 65–99)

## 2015-10-27 MED ORDER — ONDANSETRON HCL 4 MG PO TABS: 4.0000 mg | ORAL_TABLET | Freq: Three times a day (TID) | ORAL | Status: AC | PRN

## 2015-10-27 MED ORDER — ONDANSETRON 4 MG PO TBDP
4.0000 mg | ORAL_TABLET | Freq: Once | ORAL | Status: AC
  Administered 2015-10-27: 4 mg via ORAL
  Filled 2015-10-27: qty 1

## 2015-10-27 MED ORDER — MECLIZINE HCL 25 MG PO TABS: 25.0000 mg | ORAL_TABLET | Freq: Three times a day (TID) | ORAL | Status: AC | PRN

## 2015-10-27 MED ORDER — MECLIZINE HCL 25 MG PO TABS
25.0000 mg | ORAL_TABLET | Freq: Once | ORAL | Status: AC
  Administered 2015-10-27: 25 mg via ORAL
  Filled 2015-10-27: qty 1

## 2015-10-27 NOTE — Discharge Instructions (Signed)
Dizziness Dizziness is a common problem. It is a feeling of unsteadiness or light-headedness. You may feel like you are about to faint. Dizziness can lead to injury if you stumble or fall. Anyone can become dizzy, but dizziness is more common in older adults. This condition can be caused by a number of things, including medicines, dehydration, or illness. HOME CARE INSTRUCTIONS Taking these steps may help with your condition: Eating and Drinking  Drink enough fluid to keep your urine clear or pale yellow. This helps to keep you from becoming dehydrated. Try to drink more clear fluids, such as water.  Do not drink alcohol.  Limit your caffeine intake if directed by your health care provider.  Limit your salt intake if directed by your health care provider. Activity  Avoid making quick movements.  Rise slowly from chairs and steady yourself until you feel okay.  In the morning, first sit up on the side of the bed. When you feel okay, stand slowly while you hold onto something until you know that your balance is fine.  Move your legs often if you need to stand in one place for a long time. Tighten and relax your muscles in your legs while you are standing.  Do not drive or operate heavy machinery if you feel dizzy.  Avoid bending down if you feel dizzy. Place items in your home so that they are easy for you to reach without leaning over. Lifestyle  Do not use any tobacco products, including cigarettes, chewing tobacco, or electronic cigarettes. If you need help quitting, ask your health care provider.  Try to reduce your stress level, such as with yoga or meditation. Talk with your health care provider if you need help. General Instructions  Watch your dizziness for any changes.  Take medicines only as directed by your health care provider. Talk with your health care provider if you think that your dizziness is caused by a medicine that you are taking.  Tell a friend or a family  member that you are feeling dizzy. If he or she notices any changes in your behavior, have this person call your health care provider.  Keep all follow-up visits as directed by your health care provider. This is important. SEEK MEDICAL CARE IF:  Your dizziness does not go away.  Your dizziness or light-headedness gets worse.  You feel nauseous.  You have reduced hearing.  You have new symptoms.  You are unsteady on your feet or you feel like the room is spinning. SEEK IMMEDIATE MEDICAL CARE IF:  You vomit or have diarrhea and are unable to eat or drink anything.  You have problems talking, walking, swallowing, or using your arms, hands, or legs.  You feel generally weak.  You are not thinking clearly or you have trouble forming sentences. It may take a friend or family member to notice this.  You have chest pain, abdominal pain, shortness of breath, or sweating.  Your vision changes.  You notice any bleeding.  You have a headache.  You have neck pain or a stiff neck.  You have a fever.   This information is not intended to replace advice given to you by your health care provider. Make sure you discuss any questions you have with your health care provider.   Document Released: 04/06/2001 Document Revised: 02/25/2015 Document Reviewed: 10/07/2014 Elsevier Interactive Patient Education 2016 [employer]  Benign Positional Vertigo Vertigo is the feeling that you or your surroundings are moving when they are not.  Benign positional vertigo is the most common form of vertigo. The cause of this condition is not serious (is benign). This condition is triggered by certain movements and positions (is positional). This condition can be dangerous if it occurs while you are doing something that could endanger you or others, such as driving.  CAUSES In many cases, the cause of this condition is not known. It may be caused by a disturbance in an area of the inner ear that helps your  brain to sense movement and balance. This disturbance can be caused by a viral infection (labyrinthitis), head injury, or repetitive motion. RISK FACTORS This condition is more likely to develop in:  Women.  People who are 76 years of age or older. SYMPTOMS Symptoms of this condition usually happen when you move your head or your eyes in different directions. Symptoms may start suddenly, and they usually last for less than a minute. Symptoms may include:  Loss of balance and falling.  Feeling like you are spinning or moving.  Feeling like your surroundings are spinning or moving.  Nausea and vomiting.  Blurred vision.  Dizziness.  Involuntary eye movement (nystagmus). Symptoms can be mild and cause only slight annoyance, or they can be severe and interfere with daily life. Episodes of benign positional vertigo may return (recur) over time, and they may be triggered by certain movements. Symptoms may improve over time. DIAGNOSIS This condition is usually diagnosed by medical history and a physical exam of the head, neck, and ears. You may be referred to a health care provider who specializes in ear, nose, and throat (ENT) problems (otolaryngologist) or a provider who specializes in disorders of the nervous system (neurologist). You may have additional testing, including:  MRI.  A CT scan.  Eye movement tests. Your health care provider may ask you to change positions quickly while he or she watches you for symptoms of benign positional vertigo, such as nystagmus. Eye movement may be tested with an electronystagmogram (ENG), caloric stimulation, the Dix-Hallpike test, or the roll test.  An electroencephalogram (EEG). This records electrical activity in your brain.  Hearing tests. TREATMENT Usually, your health care provider will treat this by moving your head in specific positions to adjust your inner ear back to normal. Surgery may be needed in severe cases, but this is rare. In  some cases, benign positional vertigo may resolve on its own in 2-4 weeks. HOME CARE INSTRUCTIONS Safety  Move slowly.Avoid sudden body or head movements.  Avoid driving.  Avoid operating heavy machinery.  Avoid doing any tasks that would be dangerous to you or others if a vertigo episode would occur.  If you have trouble walking or keeping your balance, try using a cane for stability. If you feel dizzy or unstable, sit down right away.  Return to your normal activities as told by your health care provider. Ask your health care provider what activities are safe for you. General Instructions  Take over-the-counter and prescription medicines only as told by your health care provider.  Avoid certain positions or movements as told by your health care provider.  Drink enough fluid to keep your urine clear or pale yellow.  Keep all follow-up visits as told by your health care provider. This is important. SEEK MEDICAL CARE IF:  You have a fever.  Your condition gets worse or you develop new symptoms.  Your family or friends notice any behavioral changes.  Your nausea or vomiting gets worse.  You have numbness or a "  pins and needles" sensation. SEEK IMMEDIATE MEDICAL CARE IF:  You have difficulty speaking or moving.  You are always dizzy.  You faint.  You develop severe headaches.  You have weakness in your legs or arms.  You have changes in your hearing or vision.  You develop a stiff neck.  You develop sensitivity to light.   This information is not intended to replace advice given to you by your health care provider. Make sure you discuss any questions you have with your health care provider.   Document Released: 07/19/2006 Document Revised: 07/02/2015 Document Reviewed: 02/03/2015 Elsevier Interactive Patient Education [employer].

## 2015-10-27 NOTE — ED Notes (Signed)
C/o dizziness. Onset of symptoms this morning.  Also some vomiting today.  Denies any pain.  Tolerated cereal this morning, but at lunch c/o nausea.

## 2015-10-27 NOTE — ED Provider Notes (Signed)
Time Seen: Approximately 1705  I have reviewed the triage notes  Chief Complaint: Dizziness and Nausea   History of Present Illness: Stephen Camacho is a 70 y.o. male who states he started having yesterday symptoms of "dizziness". Patient goes on to describe his dizziness his feelings of being off balance or the room spinning whenever he gets up to ambulate. He denies any focal weakness in either upper or lower extremities. He denies any headaches. He has had some nausea with vomiting 1. He denies any chest pain or shortness of breath. He denies any ringing in his ears or facial pain. He denies any photophobia. He denies any ear pain or deafness.   Past Medical History  Diagnosis Date  . Hyperlipidemia   . Hypertension   . Diabetes mellitus ([place])   . CAD (coronary artery disease)     a. s/p 2 v CABG in 2012 (LIMA-LAD & SVG-OM); b. cath 07/2012 s/p PCI/DES to ostial LCx and OM  . PAD (peripheral artery disease) ([place])     a. s/p right SFA stent; b. s/p right toe amputation 2011; c. s/p right AKA spring 2016  . Seizures ([place])   . Gangrene of foot ([place])   . Wheezing   . Lung cancer ([place])   . Prostate cancer ([place])   . Stroke [facility])     Patient Active Problem List   Diagnosis Date Noted  . Chest pain 06/24/2015  . Type II diabetes mellitus with peripheral artery disease ([place]) 08/19/2014  . Gangrene ([place]) 07/22/2014  . PVD (peripheral vascular disease) ([place]) 07/21/2014  . PAD (peripheral artery disease) ([place]) 07/21/2014  . S/P AKA (above knee amputation) ([place]) 07/21/2014  . CAD (coronary artery disease) 07/21/2014  . Dyslipidemia 07/21/2014  . Essential hypertension, benign 07/21/2014    Past Surgical History  Procedure Laterality Date  . Right aka  07-10-2014  . Left femoral popliteal bypass    . Right lung lobeectomy    . Coronary artery bypass graft      Past Surgical History  Procedure Laterality Date  . Right aka  07-10-2014  . Left femoral popliteal bypass    .  Right lung lobeectomy    . Coronary artery bypass graft      Current Outpatient Rx  Name  Route  Sig  Dispense  Refill  . aspirin 81 MG tablet   Oral   Take 81 mg by mouth daily.         Marland Kitchen atorvastatin (LIPITOR) 40 MG tablet   Oral   Take 40 mg by mouth daily at 6 PM.         . cilostazol (PLETAL) 50 MG tablet   Oral   Take 50 mg by mouth 2 (two) times daily.         . insulin glargine (LANTUS) 100 UNIT/ML injection   Subcutaneous   Inject 10 Units into the skin at bedtime.         . isosorbide mononitrate (IMDUR) 60 MG 24 hr tablet   Oral   Take 60 mg by mouth daily.         Marland Kitchen lisinopril (PRINIVIL,ZESTRIL) 10 MG tablet   Oral   Take 10 mg by mouth daily.         . metFORMIN (GLUCOPHAGE) 500 MG tablet   Oral   Take by mouth 2 (two) times daily with a meal.         . oxyCODONE-acetaminophen (PERCOCET/ROXICET) 5-325 MG per tablet  Take one to two tablets by mouth every 6 hours as needed for pain   240 tablet   0   . rivaroxaban (XARELTO) 20 MG TABS tablet   Oral   Take 20 mg by mouth daily with supper.           Allergies:  Review of patient's allergies indicates no known allergies.  Family History: Family History  Problem Relation Age of Onset  . Family history unknown: Yes    Social History: Social History  Substance Use Topics  . Smoking status: Never Smoker   . Smokeless tobacco: None  . Alcohol Use: No     Review of Systems:   10 point review of systems was performed and was otherwise negative:  Constitutional: No fever Eyes: No visual disturbances ENT: No sore throat, ear pain Cardiac: No chest pain Respiratory: No shortness of breath, wheezing, or stridor Abdomen: No abdominal pain, no vomiting, No diarrhea Endocrine: No weight loss, No night sweats Extremities: No peripheral edema, cyanosis Skin: No rashes, easy bruising Neurologic: No focal weakness, trouble with speech or swollowing Urologic: No dysuria,  Hematuria, or urinary frequency   Physical Exam:  ED Triage Vitals  Enc Vitals Group     BP 10/27/15 1559 136/78 mmHg     Pulse Rate 10/27/15 1559 94     Resp 10/27/15 1559 20     Temp 10/27/15 1559 97.9 F (36.6 C)     Temp Source 10/27/15 1559 Oral     SpO2 10/27/15 1559 100 %     Weight 10/27/15 1559 176 lb (79.833 kg)     Height 10/27/15 1559 '6\' 4"'$  (1.93 m)     Head Cir --      Peak Flow --      Pain Score 10/27/15 1608 0     Pain Loc --      Pain Edu? --      Excl. in [place]? --     General: Awake , Alert , and Oriented times 3; GCS 15 Head: Normal cephalic , atraumatic Eyes: Pupils equal , round, reactive to light Nose/Throat: No nasal drainage, patent upper airway without erythema or exudate.  Neck: Supple, Full range of motion, No anterior adenopathy or palpable thyroid masses Lungs: Clear to ascultation without wheezes , rhonchi, or rales Heart: Regular rate, regular rhythm without murmurs , gallops , or rubs Abdomen: Soft, non tender without rebound, guarding , or rigidity; bowel sounds positive and symmetric in all 4 quadrants. No organomegaly .        Extremities: 2 plus symmetric pulses. No edema, clubbing or cyanosis Neurologic: normal ambulation, Motor symmetric without deficits, sensory intact Skin: warm, dry, no rashes   Labs:   All laboratory work was reviewed including any pertinent negatives or positives listed below:  Labs Reviewed  BASIC METABOLIC PANEL - Abnormal; Notable for the following:    Glucose, Bld 149 (*)    All other components within normal limits  CBC - Abnormal; Notable for the following:    RBC 4.38 (*)    Hemoglobin 11.0 (*)    HCT 34.7 (*)    MCV 79.4 (*)    MCH 25.1 (*)    MCHC 31.6 (*)    RDW 17.2 (*)    All other components within normal limits  URINALYSIS COMPLETEWITH MICROSCOPIC ([facility] ONLY)  CBG MONITORING, ED   review laboratory work showed no significant findings  EKG:  ED ECG REPORT I, Daymon Larsen, the  attending  physician, personally viewed and interpreted this ECG.  Date: 10/27/2015 EKG Time: 1612 Rate: 90 Rhythm: normal sinus rhythm QRS Axis: normal Intervals: normal ST/T Wave abnormalities: Nonspecific ST and T wave abnormality Conduction Disutrbances: none Narrative Interpretation: unremarkable No acute ischemic changes Left atrial enlargement  Radiology:     EXAM: CT HEAD WITHOUT CONTRAST  TECHNIQUE: Contiguous axial images were obtained from the base of the skull through the vertex without intravenous contrast.  COMPARISON: Brain MR 01/27/2006  FINDINGS: Sinuses/Soft tissues: Mucosal thickening of ethmoid air cells and bilateral frontal sinuses is mild. Clear mastoid air cells.  Intracranial: Mild low density in the periventricular white matter likely related to small vessel disease. No mass lesion, hemorrhage, hydrocephalus, acute infarct, intra-axial, or extra-axial fluid collection.  IMPRESSION: 1. No acute intracranial abnormality. 2. Mild small vessel ischemic change. 3. Minimal sinus disease.   I personally reviewed the radiologic studies     ED Course: Patient's stay was uneventful he is given an IV fluid bolus along with Zofran and meclizine and had symptomatic improvement. He was able to ambulate without any episodes of a spinning sensation. I felt given his evaluation this was most likely peripheral vertigo he was advised along with the family that his workup is not complete and he needs touch base with his primary physician. He may require MRI or neurology evaluation if his symptoms persist. I felt was unlikely to be central vertigo due to the exhaustion of symptoms. It also did not seem clinically to be near syncope.    Assessment: Peripheral vertigo Final Clinical Impression:   Final diagnoses:  None     Plan:  Outpatient management Patient was advised to return immediately if condition worsens. Patient was advised to follow up with  their primary care physician or other specialized physicians involved in their outpatient care             Daymon Larsen, MD 10/27/15 2002

## 2015-10-29 ENCOUNTER — Ambulatory Visit: Payer: PPO | Attending: Behavioral Health | Admitting: Physical Therapy

## 2015-10-29 DIAGNOSIS — R29818 Other symptoms and signs involving the nervous system: Secondary | ICD-10-CM | POA: Insufficient documentation

## 2015-10-29 DIAGNOSIS — R2681 Unsteadiness on feet: Secondary | ICD-10-CM | POA: Insufficient documentation

## 2015-10-29 DIAGNOSIS — Z89611 Acquired absence of right leg above knee: Secondary | ICD-10-CM | POA: Insufficient documentation

## 2015-10-29 DIAGNOSIS — Z7409 Other reduced mobility: Secondary | ICD-10-CM | POA: Insufficient documentation

## 2015-10-29 DIAGNOSIS — Z5189 Encounter for other specified aftercare: Secondary | ICD-10-CM | POA: Insufficient documentation

## 2015-10-29 DIAGNOSIS — R269 Unspecified abnormalities of gait and mobility: Secondary | ICD-10-CM | POA: Insufficient documentation

## 2015-11-05 ENCOUNTER — Ambulatory Visit: Payer: PPO | Admitting: Physical Therapy

## 2015-11-12 ENCOUNTER — Encounter: Payer: Self-pay | Admitting: Physical Therapy

## 2015-11-12 ENCOUNTER — Ambulatory Visit: Payer: PPO | Admitting: Physical Therapy

## 2015-11-12 DIAGNOSIS — Z89611 Acquired absence of right leg above knee: Secondary | ICD-10-CM

## 2015-11-12 DIAGNOSIS — R2681 Unsteadiness on feet: Secondary | ICD-10-CM | POA: Diagnosis present

## 2015-11-12 DIAGNOSIS — R269 Unspecified abnormalities of gait and mobility: Secondary | ICD-10-CM

## 2015-11-12 DIAGNOSIS — R2689 Other abnormalities of gait and mobility: Secondary | ICD-10-CM

## 2015-11-12 DIAGNOSIS — Z5189 Encounter for other specified aftercare: Secondary | ICD-10-CM | POA: Diagnosis present

## 2015-11-12 DIAGNOSIS — Z4789 Encounter for other orthopedic aftercare: Secondary | ICD-10-CM

## 2015-11-12 DIAGNOSIS — Z7409 Other reduced mobility: Secondary | ICD-10-CM

## 2015-11-12 DIAGNOSIS — R29818 Other symptoms and signs involving the nervous system: Secondary | ICD-10-CM | POA: Diagnosis present

## 2015-11-13 NOTE — Therapy (Signed)
[facility] [address] [place], [place], [address] Phone: [phone]   Fax:  [phone]  Physical Therapy Treatment  Patient Details  Name: Stephen Camacho MRN: [id number] Date of Birth: Jul 31, 1946 Referring Provider: Blenda Nicely, MD  Encounter Date: 11/12/2015      PT End of Session - 11/12/15 1015    Visit Number 12   Number of Visits 20   Date for PT Re-Evaluation 01/09/16   Authorization Type Medicare G-code and progress report every 10 visits.   PT Start Time 0930   PT Stop Time 1015   PT Time Calculation (min) 45 min   Equipment Utilized During Treatment Gait belt   Activity Tolerance Patient tolerated treatment well   Behavior During Therapy WFL for tasks assessed/performed      Past Medical History  Diagnosis Date  . Hyperlipidemia   . Hypertension   . Diabetes mellitus ([place])   . CAD (coronary artery disease)     a. s/p 2 v CABG in 2012 (LIMA-LAD & SVG-OM); b. cath 07/2012 s/p PCI/DES to ostial LCx and OM  . PAD (peripheral artery disease) ([place])     a. s/p right SFA stent; b. s/p right toe amputation 2011; c. s/p right AKA spring 2016  . Seizures ([place])   . Gangrene of foot ([place])   . Wheezing   . Lung cancer ([place])   . Prostate cancer ([place])   . Stroke [facility])     Past Surgical History  Procedure Laterality Date  . Right aka  07-10-2014  . Left femoral popliteal bypass    . Right lung lobeectomy    . Coronary artery bypass graft      There were no vitals filed for this visit.  Visit Diagnosis:  Abnormality of gait  Balance problems  Impaired functional mobility and activity tolerance  Status post above knee amputation of right lower extremity ([place])  Unsteadiness  Encounter for prosthetic gait training      Subjective Assessment - 11/12/15 0933    Subjective No falls. He has sick some days and did not wear the prosthesis. Still working on getting Medicaid.    Currently in  Pain? No/denies                         [facility] Adult PT Treatment/Exercise - 11/12/15 0930    Transfers   Transfers Sit to Stand;Stand to Sit   Sit to Stand 5: Supervision;With upper extremity assist;From chair/3-in-1  attempted without UE but unable   Sit to Stand Details Visual cues/gestures for sequencing;Verbal cues for technique;Tactile cues for placement   Stand to Sit 5: Supervision;With upper extremity assist;To chair/3-in-1  attempted with light UE assist but unable   Stand to Sit Details (indicate cue type and reason) Tactile cues for sequencing;Verbal cues for technique;Verbal cues for sequencing   Ambulation/Gait   Ambulation/Gait Yes   Ambulation/Gait Assistance 5: Supervision  4 balance losses that self-corrected PT ready to assist   Ambulation/Gait Assistance Details verbal cues on wt shift over prosthesis in stance. Prosthetic knee control on grass & gravel.    Ambulation Distance (Feet) 400 Feet  400' patient reported left LE fatigue   Assistive device Prosthesis;Straight cane  single point cane with quad tip   Gait Pattern Step-to pattern;Decreased stance time - right;Decreased stride length;Decreased step length - left;Decreased hip/knee flexion - right;Right steppage;Decreased trunk rotation;Trunk flexed;Abducted- right   Ambulation Surface Indoor;Level;Outdoor;Paved;Gravel;Grass   Gait velocity 0.91  ft/sec   Stairs Yes   Stairs Assistance 5: Supervision   Stairs Assistance Details (indicate cue type and reason) cues on clearing prosthesis ascending   Stair Management Technique One rail Right;One rail Left;Step to pattern;Forwards;With cane  varied rail location   Number of Stairs 4  3 reps   Ramp 5: Supervision  single point cane & prosthesis   Ramp Details (indicate cue type and reason) supervision for instability; cues on wt shift over prosthesis descending & flexing knee to ascend   Curb 5: Supervision  single point cane & prosthesis   Curb  Details (indicate cue type and reason) supervision for instability; cues on step length/foot position & step length/ step thru for balance & momentum   Standardized Balance Assessment   Standardized Balance Assessment Berg Balance Test   Berg Balance Test   Sit to Stand Able to stand  independently using hands   Standing Unsupported Able to stand safely 2 minutes   Sitting with Back Unsupported but Feet Supported on Floor or Stool Able to sit safely and securely 2 minutes   Stand to Sit Controls descent by using hands   Transfers Able to transfer safely, minor use of hands   Standing Unsupported with Eyes Closed Able to stand 10 seconds safely   Standing Ubsupported with Feet Together Able to place feet together independently and stand 1 minute safely   From Standing, Reach Forward with Outstretched Arm Can reach forward >12 cm safely (5")   From Standing Position, Pick up Object from Floor Able to pick up shoe, needs supervision   From Standing Position, Turn to Look Behind Over each Shoulder Looks behind from both sides and weight shifts well   Turn 360 Degrees Able to turn 360 degrees safely but slowly   Standing Unsupported, Alternately Place Feet on Step/Stool Able to complete >2 steps/needs minimal assist   Standing Unsupported, One Foot in Front Able to take small step independently and hold 30 seconds   Standing on One Leg Tries to lift leg/unable to hold 3 seconds but remains standing independently   Total Score 42   Timed Up and Go Test   Normal TUG (seconds) 33.42   High Level Balance   High Level Balance Activities Head turns;Turns;Negotitating around obstacles   High Level Balance Comments verbal cues on technique to step over obstacles: 2" X 4" and 4" X 2" forward facing and 4" X 6" and 5" X 4" sideways; turns head to scan with single point cane with minA  carrying basket ~10# with SBA   Self-Care   Self-Care --   Posture --   Prosthetics   Prosthetic Care Comments  PT  recommended if sick & out of bed to wear prosthesis to enable improved balance & mobility when he does need to move   Current prosthetic wear tolerance (days/week)  7 days   Current prosthetic wear tolerance (#hours/day)  all awake hours.   Current prosthetic weight-bearing tolerance (hours/day)  tolerated 20 minutes of standing & gait without issues   Residual limb condition  no open areas, normal color, moisture, hair growth and temperature   Education Provided Other (comment)  tightening suspension strap if starts catching foot   Person(s) Educated Patient   Education Method Explanation   Education Method Verbalized understanding                  PT Short Term Goals - 11/12/15 1015    PT SHORT TERM GOAL #1  Title Patient ambulates 200' with prosthesis and single point cane with supervision. (Target Date: 09/11/2015)   Baseline MET 09/09/2015 with SBQC   Time 1   Period Months   Status Achieved   PT SHORT TERM GOAL #2   Title Patient negotiates ramps & curbs with single point cane & prosthesis with supervision. . (Target Date: 09/11/2015)   Baseline Partially MET 09/09/2015 Patient negotiates ramps & curbs with contact assist with [facility].    Time 1   Period Months   Status Partially Met   PT SHORT TERM GOAL #3   Title Patient ambulates 38' with prosthesis only with minimal assist. . (Target Date: 09/11/2015)   Baseline NOT MET 09/09/2015 He was late to appointment so PT did not address.    Time 1   Period Months   Status Not Met   PT SHORT TERM GOAL #4   Title Berg Balance 43/56 to indicate lower fall risk (Target Date: 12/12/2015)   Time 1   Period Months   Status New   PT SHORT TERM GOAL #5   Title Patient ambulates 500' including grass with single point cane & prosthesis with supervision. (Target Date: 12/12/2015)   Time 1   Period Months   Status New   Additional Short Term Goals   Additional Short Term Goals Yes   PT SHORT TERM GOAL #6   Title Patient  negotiates ramps & curbs with single point cane & prosthesis with supervision & no cues for technique. (Target Date: 12/12/2015)   Time 1   Period Months   Status New           PT Long Term Goals - 11/12/15 0930    PT LONG TERM GOAL #1   Title Patient demonstrates / verbalizes proper prosthetic care of new prosthesis & componentry. (Target Date: 10/10/2015)   Baseline PT deferred 09/09/2015 as he is awaiting Medicaid to help with needed finances for new prosthesis.    Time 2   Period Months   Status Deferred   PT LONG TERM GOAL #2   Title Patient tolerates wear of new prosthesis >90% of awake hours without skin issues or limb tenderness.  (Target Date: 10/10/2015)   Baseline PT deferred 09/09/2015 as he is awaiting Medicaid to help with needed finances for new prosthesis.    Time 2   Period Months   Status Deferred   PT LONG TERM GOAL #3   Title Patient ambulates 500' with cane & prosthesis modified independent.  (NEW Target Date: 01/09/2016)   Baseline Progressing but not met 11/12/2015 patient ambulates 400' including grass & gravel with single point cane & prosthesis with supervision. Distance limited by LE fatigue.   Time 2   Period Months   Status On-going   PT LONG TERM GOAL #4   Title Patient negotiates ramps, curbs & stairs (1 rail) with single point cane & prosthesis modified independent.  (NEW Target Date: 01/09/2016)   Baseline progressing but not met 11/12/2015 patient negotiates ramps, curbs & stairs (1 rail) with prosthesis & single point cane with supervision & cues   Time 2   Period Months   Status On-going   PT LONG TERM GOAL #5   Title Patient carries 10# bag while ambulating with single point cane & prosthesis modified independent.  (NEW Target Date: 01/09/2016)   Baseline Patient requires minA for balance when carrying weight while ambulating.    Time 2   Period Months   Status Revised   PT  LONG TERM GOAL #6   Title Berg Balance >/= 45/56 to lower fall risk.   (NEW Target Date: 01/09/2016)   Baseline NOT MET 11/12/2015   Merrilee Jansky Balance 42/56   Time 2   Period Months   Status On-going   PT LONG TERM GOAL #7   Title Timed Up & Go <30 seconds with prosthesis & single point cane. (NEW Target Date: 11/13/2015)   Baseline partially met 11/12/2015  TUG with single point cane & prosthesis 33.42sec.   Time 2   Period Months   Status On-going               Plan - 11/12/15 1015    Clinical Impression Statement Patient is still awaiting Medicaid so he can afford new K3 prosthesis that he needs to maximize his potential mobility at full community level with single point cane and vary cadence depending on environment. Patient is making slow progress with skilled care with K2 level ill-fitting prosthesis. He needs additional time to meet LTGs see updated status.    Pt will benefit from skilled therapeutic intervention in order to improve on the following deficits Abnormal gait;Decreased activity tolerance;Decreased balance;Decreased mobility;Prosthetic Dependency   Rehab Potential Good   PT Frequency 1x / week   PT Duration 8 weeks   PT Treatment/Interventions ADLs/Self Care Home Management;Gait training;Stair training;Patient/family education;Functional mobility training;Therapeutic activities;Therapeutic exercise;Balance training;Neuromuscular re-education;Prosthetic Training   PT Next Visit Plan Continue gait training with straight cane with four point rubber tip.   Consulted and Agree with Plan of Care Patient        Problem List Patient Active Problem List   Diagnosis Date Noted  . Chest pain 06/24/2015  . 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 Gangrene ([place]) 07/22/2014  . PVD (peripheral vascular disease) ([place]) 07/21/2014  . PAD (peripheral artery disease) ([place]) 07/21/2014  . S/P AKA (above knee amputation) ([place]) 07/21/2014  . CAD (coronary artery disease) 07/21/2014  . Dyslipidemia 07/21/2014  .  Essential hypertension, benign 07/21/2014    Jamey Reas PT, DPT 11/13/2015, 8:06 AM  [facility] [address] [place], [place], [address] Phone: [phone]   Fax:  [phone]  Name: Stephen Camacho MRN: [id number] Date of Birth: 1946/08/29

## 2015-11-18 ENCOUNTER — Ambulatory Visit: Payer: PPO | Admitting: Physical Therapy

## 2015-11-18 ENCOUNTER — Encounter: Payer: Self-pay | Admitting: Physical Therapy

## 2015-11-18 DIAGNOSIS — Z4789 Encounter for other orthopedic aftercare: Secondary | ICD-10-CM

## 2015-11-18 DIAGNOSIS — Z7409 Other reduced mobility: Secondary | ICD-10-CM

## 2015-11-18 DIAGNOSIS — R269 Unspecified abnormalities of gait and mobility: Secondary | ICD-10-CM

## 2015-11-18 DIAGNOSIS — R2689 Other abnormalities of gait and mobility: Secondary | ICD-10-CM

## 2015-11-18 DIAGNOSIS — R2681 Unsteadiness on feet: Secondary | ICD-10-CM

## 2015-11-18 DIAGNOSIS — Z89611 Acquired absence of right leg above knee: Secondary | ICD-10-CM

## 2015-11-18 NOTE — Therapy (Signed)
[facility] [address] [place], [place], [address] Phone: [phone]   Fax:  [phone]  Physical Therapy Treatment  Patient Details  Name: Stephen Camacho MRN: [id number] Date of Birth: Sep 21, 1946 Referring Provider: Blenda Nicely, MD  Encounter Date: 11/18/2015      PT End of Session - 11/18/15 1015    Visit Number 13   Number of Visits 20   Date for PT Re-Evaluation 01/09/16   Authorization Type Medicare G-code and progress report every 10 visits.   PT Start Time 0930   PT Stop Time 1015   PT Time Calculation (min) 45 min   Equipment Utilized During Treatment Gait belt   Activity Tolerance Patient tolerated treatment well   Behavior During Therapy WFL for tasks assessed/performed      Past Medical History  Diagnosis Date  . Hyperlipidemia   . Hypertension   . Diabetes mellitus ([place])   . CAD (coronary artery disease)     a. s/p 2 v CABG in 2012 (LIMA-LAD & SVG-OM); b. cath 07/2012 s/p PCI/DES to ostial LCx and OM  . PAD (peripheral artery disease) ([place])     a. s/p right SFA stent; b. s/p right toe amputation 2011; c. s/p right AKA spring 2016  . Seizures ([place])   . Gangrene of foot ([place])   . Wheezing   . Lung cancer ([place])   . Prostate cancer ([place])   . Stroke [facility])     Past Surgical History  Procedure Laterality Date  . Right aka  07-10-2014  . Left femoral popliteal bypass    . Right lung lobeectomy    . Coronary artery bypass graft      There were no vitals filed for this visit.  Visit Diagnosis:  Abnormality of gait  Balance problems  Impaired functional mobility and activity tolerance  Status post above knee amputation of right lower extremity ([place])  Unsteadiness  Encounter for prosthetic gait training      Subjective Assessment - 11/18/15 0930    Subjective No falls. He feels funny this morning and just realized he forgot to that his medication.    Patient is accompained  by: Family member   Currently in Pain? No/denies                         [facility] Adult PT Treatment/Exercise - 11/18/15 0930    Transfers   Transfers Sit to Stand;Stand to Sit   Sit to Stand 5: Supervision;With upper extremity assist;From chair/3-in-1  chairs without armrests pushing with UEs   Sit to Stand Details Visual cues/gestures for sequencing;Verbal cues for technique;Tactile cues for placement   Stand to Sit 5: Supervision;With upper extremity assist;To chair/3-in-1  chairs without armrests using UEs   Stand to Sit Details (indicate cue type and reason) Tactile cues for sequencing;Verbal cues for technique;Verbal cues for sequencing   Ambulation/Gait   Ambulation/Gait Yes   Ambulation/Gait Assistance 5: Supervision   Ambulation/Gait Assistance Details verbal & demo cues on advancing prosthesis with pelvic motion, cues on wt shift over prosthesis in stance, proper step length   Ambulation Distance (Feet) 400 Feet  400'  X 2   Assistive device Prosthesis;Straight cane  single point cane with quad tip   Gait Pattern Decreased stance time - right;Decreased stride length;Decreased step length - left;Decreased hip/knee flexion - right;Right steppage;Decreased trunk rotation;Trunk flexed;Abducted- right;Step-through pattern   Ambulation Surface Indoor;Level   Gait velocity --   Stairs  Yes   Stairs Assistance 5: Supervision   Stairs Assistance Details (indicate cue type and reason) cues on sequence, wt shift    Stair Management Technique One rail Right;One rail Left;Step to pattern;Forwards;With cane  varied rail location   Number of Stairs 4  3 reps   Ramp 5: Supervision  single point cane & prosthesis   Ramp Details (indicate cue type and reason) cues on prosthetic knee control, posture & wt shift   Curb 5: Supervision  single point cane & prosthesis   Curb Details (indicate cue type and reason) verbal & demo cues on step thru to facilitate balance    Standardized Balance Assessment   Standardized Balance Assessment --   Berg Balance Test   Sit to Stand --   Standing Unsupported --   Sitting with Back Unsupported but Feet Supported on Floor or Stool --   Stand to Sit --   Transfers --   Standing Unsupported with Eyes Closed --   Standing Ubsupported with Feet Together --   From Standing, Reach Forward with Outstretched Arm --   From Standing Position, Pick up Object from Floor --   From Standing Position, Turn to Look Behind Over each Shoulder --   Turn 360 Degrees --   Standing Unsupported, Alternately Place Feet on Step/Stool --   Standing Unsupported, One Foot in Front --   Standing on One Leg --   Total Score --   Timed Up and Go Test   Normal TUG (seconds) --   High Level Balance   High Level Balance Activities Head turns;Turns;Negotitating around obstacles;Negotiating over obstacles   High Level Balance Comments verbal cues on technique to step over obstacles: 2" X 4" and 4" X 2" forward facing and 4" X 6" and 5" X 4" sideways; turns head to scan with single point cane with minA  carrying basket ~10# with SBA   Prosthetics   Prosthetic Care Comments  --   Current prosthetic wear tolerance (days/week)  7 days   Current prosthetic wear tolerance (#hours/day)  all awake hours.   Current prosthetic weight-bearing tolerance (hours/day)  tolerated 20 minutes of standing & gait without issues   Residual limb condition  no open areas, normal color, moisture, hair growth and temperature   Education Provided Other (comment)  tightening suspension strap if starts catching foot   Person(s) Educated Patient   Education Method Explanation   Education Method Verbalized understanding                  PT Short Term Goals - 11/12/15 1015    PT SHORT TERM GOAL #1   Title Patient ambulates 200' with prosthesis and single point cane with supervision. (Target Date: 09/11/2015)   Baseline MET 09/09/2015 with SBQC   Time 1    Period Months   Status Achieved   PT SHORT TERM GOAL #2   Title Patient negotiates ramps & curbs with single point cane & prosthesis with supervision. . (Target Date: 09/11/2015)   Baseline Partially MET 09/09/2015 Patient negotiates ramps & curbs with contact assist with [facility]    Time 1   Period Months   Status Partially Met   PT SHORT TERM GOAL #3   Title Patient ambulates 63' with prosthesis only with minimal assist. . (Target Date: 09/11/2015)   Baseline NOT MET 09/09/2015 He was late to appointment so PT did not address.    Time 1   Period Months   Status Not Met  PT SHORT TERM GOAL #4   Title Berg Balance 43/56 to indicate lower fall risk (Target Date: 12/12/2015)   Time 1   Period Months   Status New   PT SHORT TERM GOAL #5   Title Patient ambulates 500' including grass with single point cane & prosthesis with supervision. (Target Date: 12/12/2015)   Time 1   Period Months   Status New   Additional Short Term Goals   Additional Short Term Goals Yes   PT SHORT TERM GOAL #6   Title Patient negotiates ramps & curbs with single point cane & prosthesis with supervision & no cues for technique. (Target Date: 12/12/2015)   Time 1   Period Months   Status New           PT Long Term Goals - 11/12/15 0930    PT LONG TERM GOAL #1   Title Patient demonstrates / verbalizes proper prosthetic care of new prosthesis & componentry. (Target Date: 10/10/2015)   Baseline PT deferred 09/09/2015 as he is awaiting Medicaid to help with needed finances for new prosthesis.    Time 2   Period Months   Status Deferred   PT LONG TERM GOAL #2   Title Patient tolerates wear of new prosthesis >90% of awake hours without skin issues or limb tenderness.  (Target Date: 10/10/2015)   Baseline PT deferred 09/09/2015 as he is awaiting Medicaid to help with needed finances for new prosthesis.    Time 2   Period Months   Status Deferred   PT LONG TERM GOAL #3   Title Patient ambulates 500' with cane  & prosthesis modified independent.  (NEW Target Date: 01/09/2016)   Baseline Progressing but not met 11/12/2015 patient ambulates 400' including grass & gravel with single point cane & prosthesis with supervision. Distance limited by LE fatigue.   Time 2   Period Months   Status On-going   PT LONG TERM GOAL #4   Title Patient negotiates ramps, curbs & stairs (1 rail) with single point cane & prosthesis modified independent.  (NEW Target Date: 01/09/2016)   Baseline progressing but not met 11/12/2015 patient negotiates ramps, curbs & stairs (1 rail) with prosthesis & single point cane with supervision & cues   Time 2   Period Months   Status On-going   PT LONG TERM GOAL #5   Title Patient carries 10# bag while ambulating with single point cane & prosthesis modified independent.  (NEW Target Date: 01/09/2016)   Baseline Patient requires minA for balance when carrying weight while ambulating.    Time 2   Period Months   Status Revised   PT LONG TERM GOAL #6   Title Berg Balance >/= 45/56 to lower fall risk.  (NEW Target Date: 01/09/2016)   Baseline NOT MET 11/12/2015   Merrilee Jansky Balance 42/56   Time 2   Period Months   Status On-going   PT LONG TERM GOAL #7   Title Timed Up & Go <30 seconds with prosthesis & single point cane. (NEW Target Date: 11/13/2015)   Baseline partially met 11/12/2015  TUG with single point cane & prosthesis 33.42sec.   Time 2   Period Months   Status On-going               Plan - 11/18/15 1015    Clinical Impression Statement Patient improved ability to step over obstacles but requires cues for technique / sequence. Patient had 3 losses of balance that required supervision but no physical  asist.    Pt will benefit from skilled therapeutic intervention in order to improve on the following deficits Abnormal gait;Decreased activity tolerance;Decreased balance;Decreased mobility;Prosthetic Dependency   Rehab Potential Good   PT Frequency 1x / week   PT Duration 8 weeks    PT Treatment/Interventions ADLs/Self Care Home Management;Gait training;Stair training;Patient/family education;Functional mobility training;Therapeutic activities;Therapeutic exercise;Balance training;Neuromuscular re-education;Prosthetic Training   PT Next Visit Plan Continue gait training with straight cane with four point rubber tip.   Consulted and Agree with Plan of Care Patient        Problem List Patient Active Problem List   Diagnosis Date Noted  . Chest pain 06/24/2015  . 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 Gangrene ([place]) 07/22/2014  . PVD (peripheral vascular disease) ([place]) 07/21/2014  . PAD (peripheral artery disease) ([place]) 07/21/2014  . S/P AKA (above knee amputation) ([place]) 07/21/2014  . CAD (coronary artery disease) 07/21/2014  . Dyslipidemia 07/21/2014  . Essential hypertension, benign 07/21/2014    Jamey Reas PT, DPT 11/18/2015, 3:18 PM  [facility] [address] [place], [place], [address] Phone: [phone]   Fax:  [phone]  Name: Stephen Camacho MRN: [id number] Date of Birth: 1946/03/13

## 2015-11-25 ENCOUNTER — Ambulatory Visit: Payer: PPO | Admitting: Physical Therapy

## 2015-11-25 ENCOUNTER — Encounter: Payer: Self-pay | Admitting: Physical Therapy

## 2015-11-25 DIAGNOSIS — Z4789 Encounter for other orthopedic aftercare: Secondary | ICD-10-CM

## 2015-11-25 DIAGNOSIS — R269 Unspecified abnormalities of gait and mobility: Secondary | ICD-10-CM | POA: Diagnosis not present

## 2015-11-25 DIAGNOSIS — Z89611 Acquired absence of right leg above knee: Secondary | ICD-10-CM

## 2015-11-25 DIAGNOSIS — R2681 Unsteadiness on feet: Secondary | ICD-10-CM

## 2015-11-25 DIAGNOSIS — R2689 Other abnormalities of gait and mobility: Secondary | ICD-10-CM

## 2015-11-25 DIAGNOSIS — Z7409 Other reduced mobility: Secondary | ICD-10-CM

## 2015-11-25 NOTE — Therapy (Signed)
[facility] [address] [place], [place], [address] Phone: [phone]   Fax:  [phone]  Physical Therapy Treatment  Patient Details  Name: Stephen Camacho MRN: [id number] Date of Birth: 07/14/1946 Referring Provider: Blenda Nicely, MD  Encounter Date: 11/25/2015      PT End of Session - 11/25/15 1040    Visit Number 14   Number of Visits 20   Date for PT Re-Evaluation 01/09/16   Authorization Type Medicare G-code and progress report every 10 visits.   PT Start Time [phone]   PT Stop Time 1030   PT Time Calculation (min) 40 min   Equipment Utilized During Treatment Gait belt   Activity Tolerance Patient tolerated treatment well   Behavior During Therapy WFL for tasks assessed/performed      Past Medical History  Diagnosis Date  . Hyperlipidemia   . Hypertension   . Diabetes mellitus ([place])   . CAD (coronary artery disease)     a. s/p 2 v CABG in 2012 (LIMA-LAD & SVG-OM); b. cath 07/2012 s/p PCI/DES to ostial LCx and OM  . PAD (peripheral artery disease) ([place])     a. s/p right SFA stent; b. s/p right toe amputation 2011; c. s/p right AKA spring 2016  . Seizures ([place])   . Gangrene of foot ([place])   . Wheezing   . Lung cancer ([place])   . Prostate cancer ([place])   . Stroke [facility])     Past Surgical History  Procedure Laterality Date  . Right aka  07-10-2014  . Left femoral popliteal bypass    . Right lung lobeectomy    . Coronary artery bypass graft      There were no vitals filed for this visit.  Visit Diagnosis:  Abnormality of gait  Balance problems  Impaired functional mobility and activity tolerance  Status post above knee amputation of right lower extremity ([place])  Unsteadiness  Encounter for prosthetic gait training      Subjective Assessment - 11/25/15 0952    Subjective No falls. He is walking more.    Currently in Pain? No/denies                         [facility] Adult  PT Treatment/Exercise - 11/25/15 0930    Transfers   Transfers Sit to Stand;Stand to Sit   Sit to Stand 5: Supervision;With upper extremity assist;From chair/3-in-1  chairs without armrests pushing with UEs   Sit to Stand Details Visual cues/gestures for sequencing;Verbal cues for technique;Tactile cues for placement   Stand to Sit 5: Supervision;With upper extremity assist;To chair/3-in-1  chairs without armrests using UEs   Stand to Sit Details (indicate cue type and reason) Tactile cues for sequencing;Verbal cues for technique;Verbal cues for sequencing   Ambulation/Gait   Ambulation/Gait Yes   Ambulation/Gait Assistance 5: Supervision   Ambulation/Gait Assistance Details Working on step-thru, maintaining pace with scanning, negotiating furniture & obstacles with cues on technique   Ambulation Distance (Feet) 400 Feet  400'  X 2   Assistive device Prosthesis;Straight cane  single point cane with quad tip   Gait Pattern Decreased stance time - right;Decreased stride length;Decreased step length - left;Decreased hip/knee flexion - right;Right steppage;Decreased trunk rotation;Trunk flexed;Abducted- right;Step-through pattern   Ambulation Surface Indoor;Level;Outdoor;Unlevel;Paved;Gravel;Grass   Stairs Yes   Stairs Assistance 5: Supervision   Stairs Assistance Details (indicate cue type and reason) cues on wt shift   Stair Management Technique One rail Right;One  rail Left;Step to pattern;Forwards;With cane  varied rail location   Number of Stairs 4  3 reps   Ramp 5: Supervision  single point cane & prosthesis   Ramp Details (indicate cue type and reason) tactile cues on posture & wt shift   Curb 5: Supervision  single point cane & prosthesis   Curb Details (indicate cue type and reason) verbal & tactile cues on step length & wt shift   High Level Balance   High Level Balance Activities Head turns;Turns;Negotitating around obstacles;Negotiating over obstacles;Backward walking;Side  stepping;Figure 8 turns   High Level Balance Comments verbal cues on technique to step over obstacles: 2" X 4" and 4" X 2" forward facing and 4" X 6" and 5" X 4" sideways; turns head to scan with single point cane with minA  carrying basket ~10# with SBA   Therapeutic Activites    Therapeutic Activities Lifting   Lifting picking up objects from floor with PT demo, cueing technique with prosthesis.    Prosthetics   Current prosthetic wear tolerance (days/week)  7 days   Current prosthetic wear tolerance (#hours/day)  all awake hours.   Current prosthetic weight-bearing tolerance (hours/day)  tolerated 20 minutes of standing & gait without issues   Residual limb condition  no open areas, normal color, moisture, hair growth and temperature   Education Provided Other (comment)  tightening suspension strap if starts catching foot                  PT Short Term Goals - 11/12/15 1015    PT SHORT TERM GOAL #1   Title Patient ambulates 200' with prosthesis and single point cane with supervision. (Target Date: 09/11/2015)   Baseline MET 09/09/2015 with SBQC   Time 1   Period Months   Status Achieved   PT SHORT TERM GOAL #2   Title Patient negotiates ramps & curbs with single point cane & prosthesis with supervision. . (Target Date: 09/11/2015)   Baseline Partially MET 09/09/2015 Patient negotiates ramps & curbs with contact assist with [facility].    Time 1   Period Months   Status Partially Met   PT SHORT TERM GOAL #3   Title Patient ambulates 2' with prosthesis only with minimal assist. . (Target Date: 09/11/2015)   Baseline NOT MET 09/09/2015 He was late to appointment so PT did not address.    Time 1   Period Months   Status Not Met   PT SHORT TERM GOAL #4   Title Berg Balance 43/56 to indicate lower fall risk (Target Date: 12/12/2015)   Time 1   Period Months   Status New   PT SHORT TERM GOAL #5   Title Patient ambulates 500' including grass with single point cane & prosthesis  with supervision. (Target Date: 12/12/2015)   Time 1   Period Months   Status New   Additional Short Term Goals   Additional Short Term Goals Yes   PT SHORT TERM GOAL #6   Title Patient negotiates ramps & curbs with single point cane & prosthesis with supervision & no cues for technique. (Target Date: 12/12/2015)   Time 1   Period Months   Status New           PT Long Term Goals - 11/12/15 0930    PT LONG TERM GOAL #1   Title Patient demonstrates / verbalizes proper prosthetic care of new prosthesis & componentry. (Target Date: 10/10/2015)   Baseline PT deferred 09/09/2015 as he is  awaiting Medicaid to help with needed finances for new prosthesis.    Time 2   Period Months   Status Deferred   PT LONG TERM GOAL #2   Title Patient tolerates wear of new prosthesis >90% of awake hours without skin issues or limb tenderness.  (Target Date: 10/10/2015)   Baseline PT deferred 09/09/2015 as he is awaiting Medicaid to help with needed finances for new prosthesis.    Time 2   Period Months   Status Deferred   PT LONG TERM GOAL #3   Title Patient ambulates 500' with cane & prosthesis modified independent.  (NEW Target Date: 01/09/2016)   Baseline Progressing but not met 11/12/2015 patient ambulates 400' including grass & gravel with single point cane & prosthesis with supervision. Distance limited by LE fatigue.   Time 2   Period Months   Status On-going   PT LONG TERM GOAL #4   Title Patient negotiates ramps, curbs & stairs (1 rail) with single point cane & prosthesis modified independent.  (NEW Target Date: 01/09/2016)   Baseline progressing but not met 11/12/2015 patient negotiates ramps, curbs & stairs (1 rail) with prosthesis & single point cane with supervision & cues   Time 2   Period Months   Status On-going   PT LONG TERM GOAL #5   Title Patient carries 10# bag while ambulating with single point cane & prosthesis modified independent.  (NEW Target Date: 01/09/2016)   Baseline  Patient requires minA for balance when carrying weight while ambulating.    Time 2   Period Months   Status Revised   PT LONG TERM GOAL #6   Title Berg Balance >/= 45/56 to lower fall risk.  (NEW Target Date: 01/09/2016)   Baseline NOT MET 11/12/2015   Merrilee Jansky Balance 42/56   Time 2   Period Months   Status On-going   PT LONG TERM GOAL #7   Title Timed Up & Go <30 seconds with prosthesis & single point cane. (NEW Target Date: 11/13/2015)   Baseline partially met 11/12/2015  TUG with single point cane & prosthesis 33.42sec.   Time 2   Period Months   Status On-going               Plan - 11/25/15 1041    Clinical Impression Statement Patient's balance reactions improved with no significant balance losses requiring PT assist to correct. Patient reports left LE fatigue limits distances at ~400'.    Pt will benefit from skilled therapeutic intervention in order to improve on the following deficits Abnormal gait;Decreased activity tolerance;Decreased balance;Decreased mobility;Prosthetic Dependency   Rehab Potential Good   PT Frequency 1x / week   PT Duration 8 weeks   PT Treatment/Interventions ADLs/Self Care Home Management;Gait training;Stair training;Patient/family education;Functional mobility training;Therapeutic activities;Therapeutic exercise;Balance training;Neuromuscular re-education;Prosthetic Training   PT Next Visit Plan Continue gait training with straight cane with four point rubber tip.   Consulted and Agree with Plan of Care Patient        Problem List Patient Active Problem List   Diagnosis Date Noted  . Chest pain 06/24/2015  . 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 Gangrene ([place]) 07/22/2014  . PVD (peripheral vascular disease) ([place]) 07/21/2014  . PAD (peripheral artery disease) ([place]) 07/21/2014  . S/P AKA (above knee amputation) ([place]) 07/21/2014  . CAD (coronary artery disease) 07/21/2014  . Dyslipidemia 07/21/2014  .  Essential hypertension, benign 07/21/2014    Faron Whitelock PT, DPT 11/25/2015, 10:45 AM  [facility] [address] [place], [place], [address] Phone: [phone]   Fax:  [phone]  Name: Dakotah Orrego MRN: [id number] Date of Birth: [birth year]

## 2015-12-02 ENCOUNTER — Emergency Department
Admission: EM | Admit: 2015-12-02 | Discharge: 2015-12-02 | Disposition: A | Discharge status: Home / Self Care | Payer: PPO | Source: Home / Self Care | Attending: Emergency Medicine | Admitting: Emergency Medicine

## 2015-12-02 ENCOUNTER — Emergency Department: Payer: PPO

## 2015-12-02 ENCOUNTER — Encounter: Payer: Self-pay | Admitting: Medical Oncology

## 2015-12-02 ENCOUNTER — Ambulatory Visit: Payer: PPO | Attending: Behavioral Health | Admitting: Physical Therapy

## 2015-12-02 ENCOUNTER — Encounter: Payer: Self-pay | Admitting: Physical Therapy

## 2015-12-02 VITALS — BP 157/99 | HR 85

## 2015-12-02 DIAGNOSIS — Z7409 Other reduced mobility: Secondary | ICD-10-CM

## 2015-12-02 DIAGNOSIS — M79605 Pain in left leg: Secondary | ICD-10-CM

## 2015-12-02 DIAGNOSIS — M79662 Pain in left lower leg: Secondary | ICD-10-CM

## 2015-12-02 DIAGNOSIS — T82868A Thrombosis of vascular prosthetic devices, implants and grafts, initial encounter: Secondary | ICD-10-CM | POA: Diagnosis not present

## 2015-12-02 DIAGNOSIS — E1151 Type 2 diabetes mellitus with diabetic peripheral angiopathy without gangrene: Secondary | ICD-10-CM | POA: Insufficient documentation

## 2015-12-02 DIAGNOSIS — R0600 Dyspnea, unspecified: Secondary | ICD-10-CM | POA: Insufficient documentation

## 2015-12-02 DIAGNOSIS — I10 Essential (primary) hypertension: Secondary | ICD-10-CM | POA: Insufficient documentation

## 2015-12-02 DIAGNOSIS — I998 Other disorder of circulatory system: Secondary | ICD-10-CM | POA: Diagnosis not present

## 2015-12-02 LAB — BASIC METABOLIC PANEL
Anion gap: 7 (ref 5–15)
BUN: 13 mg/dL (ref 6–20)
CHLORIDE: 106 mmol/L (ref 101–111)
CO2: 23 mmol/L (ref 22–32)
Calcium: 9.1 mg/dL (ref 8.9–10.3)
Creatinine, Ser: 0.88 mg/dL (ref 0.61–1.24)
GFR calc non Af Amer: >60 mL/min (ref >60)
Glucose, Bld: 153 mg/dL — ABNORMAL HIGH (ref 65–99)
POTASSIUM: 4.1 mmol/L (ref 3.5–5.1)
SODIUM: 136 mmol/L (ref 135–145)

## 2015-12-02 LAB — CBC
HEMATOCRIT: 32.1 % — AB (ref 40.0–52.0)
HEMOGLOBIN: 10.5 g/dL — AB (ref 13.0–18.0)
MCH: 25.9 pg — AB (ref 26.0–34.0)
MCHC: 32.8 g/dL (ref 32.0–36.0)
MCV: 79.1 fL — AB (ref 80.0–100.0)
Platelets: 204 10*3/uL (ref 150–440)
RBC: 4.05 MIL/uL — AB (ref 4.40–5.90)
RDW: 16.9 % — ABNORMAL HIGH (ref 11.5–14.5)
WBC: 4.6 10*3/uL (ref 3.8–10.6)

## 2015-12-02 LAB — TROPONIN I
Troponin I: 0.04 ng/mL — ABNORMAL HIGH (ref <0.031)
Troponin I: <0.03 ng/mL (ref <0.031)

## 2015-12-02 NOTE — Discharge Instructions (Signed)

## 2015-12-02 NOTE — ED Notes (Signed)
Walked Patient while monitoring O2. Patient stayed at 100%. Denies any discomfort or SOB.

## 2015-12-02 NOTE — ED Notes (Signed)
Pt reports sob that began yesterday. Pt denies pain, denies cough. Pt also reports that he has been having left calf pain also. Pt in NAD in triage.

## 2015-12-02 NOTE — ED Provider Notes (Signed)
[facility] Emergency Department Provider Note     Time seen: ----------------------------------------- 12:34 PM on 12/02/2015 -----------------------------------------    I have reviewed the triage vital signs and the nursing notes.   HISTORY  Chief Complaint Shortness of Breath    HPI Stephen Camacho is a 70 y.o. male who presents ER for shortness of breath that began yesterday. He denies fevers, chills, chest pain, nausea vomiting or diarrhea. Patient has not had any recent illness or cough, also reports she had some pain in his left calf. Patient has not had a history of this before, onset was when he was at rest.   Past Medical History  Diagnosis Date  . Hyperlipidemia   . Hypertension   . Diabetes mellitus ([place])   . CAD (coronary artery disease)     a. s/p 2 v CABG in 2012 (LIMA-LAD & SVG-OM); b. cath 07/2012 s/p PCI/DES to ostial LCx and OM  . PAD (peripheral artery disease) ([place])     a. s/p right SFA stent; b. s/p right toe amputation 2011; c. s/p right AKA spring 2016  . Seizures ([place])   . Gangrene of foot ([place])   . Wheezing   . Lung cancer ([place])   . Prostate cancer ([place])   . Stroke [facility])     Patient Active Problem List   Diagnosis Date Noted  . Chest pain 06/24/2015  . 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 Gangrene ([place]) 07/22/2014  . PVD (peripheral vascular disease) ([place]) 07/21/2014  . PAD (peripheral artery disease) ([place]) 07/21/2014  . S/P AKA (above knee amputation) ([place]) 07/21/2014  . CAD (coronary artery disease) 07/21/2014  . Dyslipidemia 07/21/2014  . Essential hypertension, benign 07/21/2014    Past Surgical History  Procedure Laterality Date  . Right aka  07-10-2014  . Left femoral popliteal bypass    . Right lung lobeectomy    . Coronary artery bypass graft      Allergies Review of patient's allergies indicates no known allergies.  Social History Social History   Substance Use Topics  . Smoking status: Never Smoker   . Smokeless tobacco: None  . Alcohol Use: No    Review of Systems Constitutional: Negative for fever. Eyes: Negative for visual changes. ENT: Negative for sore throat. Cardiovascular: Negative for chest pain. Respiratory: Positive for shortness of breath Gastrointestinal: Negative for abdominal pain, vomiting and diarrhea. Genitourinary: Negative for dysuria. Musculoskeletal: Positive for left leg pain Skin: Negative for rash. Neurological: Negative for headaches, focal weakness or numbness.  10-point ROS otherwise negative.  ____________________________________________   PHYSICAL EXAM:  VITAL SIGNS: ED Triage Vitals  Enc Vitals Group     BP 12/02/15 1028 144/74 mmHg     Pulse Rate 12/02/15 1028 86     Resp 12/02/15 1028 19     Temp 12/02/15 1028 97.6 F (36.4 C)     Temp Source 12/02/15 1028 Oral     SpO2 12/02/15 1028 100 %     Weight 12/02/15 1028 176 lb (79.833 kg)     Height 12/02/15 1028 '6\' 4"'$  (1.93 m)     Head Cir --      Peak Flow --      Pain Score 12/02/15 1029 7     Pain Loc --      Pain Edu? --      Excl. in [place]? --     Constitutional: Alert and oriented. Well appearing and in no distress. Eyes: Conjunctivae  are normal. PERRL. Normal extraocular movements. ENT   Head: Normocephalic and atraumatic.   Nose: No congestion/rhinnorhea.   Mouth/Throat: Mucous membranes are moist.   Neck: No stridor. Cardiovascular: Normal rate, regular rhythm. Normal and symmetric distal pulses are present in all extremities. No murmurs, rubs, or gallops. Respiratory: Normal respiratory effort without tachypnea nor retractions. Breath sounds are clear and equal bilaterally. No wheezes/rales/rhonchi. Gastrointestinal: Soft and nontender. No distention. No abdominal bruits.  Musculoskeletal: Nontender with normal range of motion in all extremities. No joint effusions.  No lower extremity tenderness nor  edema. Neurologic:  Normal speech and language. No gross focal neurologic deficits are appreciated.  Skin:  Skin is warm, dry and intact. No rash noted. Psychiatric: Mood and affect are normal. Speech and behavior are normal. Patient exhibits appropriate insight and judgment. ____________________________________________  EKG: Interpreted by me. Normal sinus rhythm with rate of 89 bpm, normal PR interval, normal QRS, normal QT interval. Normal axis. No evidence of hypertrophy or acute infarction  ____________________________________________  ED COURSE:  Pertinent labs & imaging results that were available during my care of the patient were reviewed by me and considered in my medical decision making (see chart for details). Patient looks well, will check cardiac labs and chest x-ray. ____________________________________________    LABS (pertinent positives/negatives)  Labs Reviewed  BASIC METABOLIC PANEL - Abnormal; Notable for the following:    Glucose, Bld 153 (*)    All other components within normal limits  CBC - Abnormal; Notable for the following:    RBC 4.05 (*)    Hemoglobin 10.5 (*)    HCT 32.1 (*)    MCV 79.1 (*)    MCH 25.9 (*)    RDW 16.9 (*)    All other components within normal limits  TROPONIN I - Abnormal; Notable for the following:    Troponin I 0.04 (*)    All other components within normal limits  TROPONIN I    RADIOLOGY Images were viewed by me  Chest x-ray, venous Doppler IMPRESSION: No evidence of left lower extremity DVT. IMPRESSION: No active cardiopulmonary disease. ____________________________________________  FINAL ASSESSMENT AND PLAN  Dyspnea  Plan: Patient with labs and imaging as dictated above. No clear etiology of the dyspnea is identified. He ambulates with 100% oxygenation. Troponin is negative on repeat. I advised him to follow-up with his doctor in 1-2 days for recheck.   Earleen Newport, MD   Earleen Newport,  MD 12/02/15 [phone]

## 2015-12-02 NOTE — Therapy (Signed)
[facility] [address] [place], [place], [address] Phone: [phone]   Fax:  [phone]  Physical Therapy Treatment  Patient Details  Name: Stephen Camacho MRN: [id number] Date of Birth: 07/23/1946 Referring Provider: Blenda Nicely, MD  Encounter Date: 12/02/2015      PT End of Session - 12/02/15 0948    Visit Number 15   Number of Visits 20   Date for PT Re-Evaluation 01/09/16   Authorization Type Medicare G-code and progress report every 10 visits.   PT Start Time 0915   PT Stop Time 0930   PT Time Calculation (min) 15 min   Equipment Utilized During Treatment Gait belt   Activity Tolerance Patient tolerated treatment well   Behavior During Therapy WFL for tasks assessed/performed      Past Medical History  Diagnosis Date  . Hyperlipidemia   . Hypertension   . Diabetes mellitus ([place])   . CAD (coronary artery disease)     a. s/p 2 v CABG in 2012 (LIMA-LAD & SVG-OM); b. cath 07/2012 s/p PCI/DES to ostial LCx and OM  . PAD (peripheral artery disease) ([place])     a. s/p right SFA stent; b. s/p right toe amputation 2011; c. s/p right AKA spring 2016  . Seizures ([place])   . Gangrene of foot ([place])   . Wheezing   . Lung cancer ([place])   . Prostate cancer ([place])   . Stroke [facility])     Past Surgical History  Procedure Laterality Date  . Right aka  07-10-2014  . Left femoral popliteal bypass    . Right lung lobeectomy    . Coronary artery bypass graft      Filed Vitals:   12/02/15 0922  BP: 157/99  Pulse: 85  SpO2: 99%    Visit Diagnosis:  Impaired functional mobility and activity tolerance      Subjective Assessment - 12/02/15 0922    Subjective He has been SOB since yesterday. He reports he had left calf pain yesterday that went away about same time the SOB started.    Currently in Pain? No/denies     Self-Care: see vital signs & pt education. Patient has swelling at left ankle but unable to  compare to other side due to amputation. Patient has no redness, warmth or tenderness with palpation.  No wheezing noted in lungs.                             PT Education - 12/02/15 0945    Education provided Yes   Education Details PT strongly recommended going to PCP to be checked for new onset SOB   Person(s) Educated Patient;Other (comment)  brother   Methods Explanation;Verbal cues   Comprehension Verbalized understanding          PT Short Term Goals - 11/12/15 1015    PT SHORT TERM GOAL #1   Title Patient ambulates 200' with prosthesis and single point cane with supervision. (Target Date: 09/11/2015)   Baseline MET 09/09/2015 with SBQC   Time 1   Period Months   Status Achieved   PT SHORT TERM GOAL #2   Title Patient negotiates ramps & curbs with single point cane & prosthesis with supervision. . (Target Date: 09/11/2015)   Baseline Partially MET 09/09/2015 Patient negotiates ramps & curbs with contact assist with [facility].    Time 1   Period Months   Status Partially Met  PT SHORT TERM GOAL #3   Title Patient ambulates 23' with prosthesis only with minimal assist. . (Target Date: 09/11/2015)   Baseline NOT MET 09/09/2015 He was late to appointment so PT did not address.    Time 1   Period Months   Status Not Met   PT SHORT TERM GOAL #4   Title Berg Balance 43/56 to indicate lower fall risk (Target Date: 12/12/2015)   Time 1   Period Months   Status New   PT SHORT TERM GOAL #5   Title Patient ambulates 500' including grass with single point cane & prosthesis with supervision. (Target Date: 12/12/2015)   Time 1   Period Months   Status New   Additional Short Term Goals   Additional Short Term Goals Yes   PT SHORT TERM GOAL #6   Title Patient negotiates ramps & curbs with single point cane & prosthesis with supervision & no cues for technique. (Target Date: 12/12/2015)   Time 1   Period Months   Status New           PT Long Term Goals -  11/12/15 0930    PT LONG TERM GOAL #1   Title Patient demonstrates / verbalizes proper prosthetic care of new prosthesis & componentry. (Target Date: 10/10/2015)   Baseline PT deferred 09/09/2015 as he is awaiting Medicaid to help with needed finances for new prosthesis.    Time 2   Period Months   Status Deferred   PT LONG TERM GOAL #2   Title Patient tolerates wear of new prosthesis >90% of awake hours without skin issues or limb tenderness.  (Target Date: 10/10/2015)   Baseline PT deferred 09/09/2015 as he is awaiting Medicaid to help with needed finances for new prosthesis.    Time 2   Period Months   Status Deferred   PT LONG TERM GOAL #3   Title Patient ambulates 500' with cane & prosthesis modified independent.  (NEW Target Date: 01/09/2016)   Baseline Progressing but not met 11/12/2015 patient ambulates 400' including grass & gravel with single point cane & prosthesis with supervision. Distance limited by LE fatigue.   Time 2   Period Months   Status On-going   PT LONG TERM GOAL #4   Title Patient negotiates ramps, curbs & stairs (1 rail) with single point cane & prosthesis modified independent.  (NEW Target Date: 01/09/2016)   Baseline progressing but not met 11/12/2015 patient negotiates ramps, curbs & stairs (1 rail) with prosthesis & single point cane with supervision & cues   Time 2   Period Months   Status On-going   PT LONG TERM GOAL #5   Title Patient carries 10# bag while ambulating with single point cane & prosthesis modified independent.  (NEW Target Date: 01/09/2016)   Baseline Patient requires minA for balance when carrying weight while ambulating.    Time 2   Period Months   Status Revised   PT LONG TERM GOAL #6   Title Berg Balance >/= 45/56 to lower fall risk.  (NEW Target Date: 01/09/2016)   Baseline NOT MET 11/12/2015   Merrilee Jansky Balance 42/56   Time 2   Period Months   Status On-going   PT LONG TERM GOAL #7   Title Timed Up & Go <30 seconds with prosthesis &  single point cane. (NEW Target Date: 11/13/2015)   Baseline partially met 11/12/2015  TUG with single point cane & prosthesis 33.42sec.   Time 2   Period Months  Status On-going               Plan - 12/02/15 0948    Clinical Impression Statement Patient appears to have symptoms of possible DVT &/or PE so PT recommended ED initially but patient did not want to go to ED. So PT recommended immediate visit to PCP and he agreed.    Pt will benefit from skilled therapeutic intervention in order to improve on the following deficits Abnormal gait;Decreased activity tolerance;Decreased balance;Decreased mobility;Prosthetic Dependency   Rehab Potential Good   PT Frequency 1x / week   PT Duration 8 weeks   PT Treatment/Interventions ADLs/Self Care Home Management;Gait training;Stair training;Patient/family education;Functional mobility training;Therapeutic activities;Therapeutic exercise;Balance training;Neuromuscular re-education;Prosthetic Training   PT Next Visit Plan Continue gait training with straight cane with four point rubber tip.   Consulted and Agree with Plan of Care Patient        Problem List Patient Active Problem List   Diagnosis Date Noted  . Chest pain 06/24/2015  . 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 Gangrene ([place]) 07/22/2014  . PVD (peripheral vascular disease) ([place]) 07/21/2014  . PAD (peripheral artery disease) ([place]) 07/21/2014  . S/P AKA (above knee amputation) ([place]) 07/21/2014  . CAD (coronary artery disease) 07/21/2014  . Dyslipidemia 07/21/2014  . Essential hypertension, benign 07/21/2014    Jamey Reas PT, DPT 12/02/2015, 9:52 AM  [facility] [address] [place], [place], [address] Phone: [phone]   Fax:  [phone]  Name: Stephen Camacho MRN: [id number] Date of Birth: 1946/02/04

## 2015-12-04 DIAGNOSIS — Y832 Surgical operation with anastomosis, bypass or graft as the cause of abnormal reaction of the patient, or of later complication, without mention of misadventure at the time of the procedure: Secondary | ICD-10-CM | POA: Diagnosis present

## 2015-12-04 DIAGNOSIS — L89152 Pressure ulcer of sacral region, stage 2: Secondary | ICD-10-CM | POA: Diagnosis present

## 2015-12-04 DIAGNOSIS — Z955 Presence of coronary angioplasty implant and graft: Secondary | ICD-10-CM

## 2015-12-04 DIAGNOSIS — L8992 Pressure ulcer of unspecified site, stage 2: Secondary | ICD-10-CM | POA: Diagnosis present

## 2015-12-04 DIAGNOSIS — T82868A Thrombosis of vascular prosthetic devices, implants and grafts, initial encounter: Principal | ICD-10-CM | POA: Diagnosis present

## 2015-12-04 DIAGNOSIS — Z951 Presence of aortocoronary bypass graft: Secondary | ICD-10-CM

## 2015-12-04 DIAGNOSIS — I071 Rheumatic tricuspid insufficiency: Secondary | ICD-10-CM | POA: Diagnosis present

## 2015-12-04 DIAGNOSIS — I2510 Atherosclerotic heart disease of native coronary artery without angina pectoris: Secondary | ICD-10-CM | POA: Diagnosis present

## 2015-12-04 DIAGNOSIS — Z89611 Acquired absence of right leg above knee: Secondary | ICD-10-CM

## 2015-12-04 DIAGNOSIS — Y833 Surgical operation with formation of external stoma as the cause of abnormal reaction of the patient, or of later complication, without mention of misadventure at the time of the procedure: Secondary | ICD-10-CM | POA: Diagnosis present

## 2015-12-04 DIAGNOSIS — I70262 Atherosclerosis of native arteries of extremities with gangrene, left leg: Secondary | ICD-10-CM | POA: Diagnosis present

## 2015-12-04 DIAGNOSIS — B9561 Methicillin susceptible Staphylococcus aureus infection as the cause of diseases classified elsewhere: Secondary | ICD-10-CM | POA: Diagnosis not present

## 2015-12-04 DIAGNOSIS — Z85118 Personal history of other malignant neoplasm of bronchus and lung: Secondary | ICD-10-CM

## 2015-12-04 DIAGNOSIS — Z8673 Personal history of transient ischemic attack (TIA), and cerebral infarction without residual deficits: Secondary | ICD-10-CM

## 2015-12-04 DIAGNOSIS — Z902 Acquired absence of lung [part of]: Secondary | ICD-10-CM

## 2015-12-04 DIAGNOSIS — Z794 Long term (current) use of insulin: Secondary | ICD-10-CM

## 2015-12-04 DIAGNOSIS — I480 Paroxysmal atrial fibrillation: Secondary | ICD-10-CM | POA: Diagnosis not present

## 2015-12-04 DIAGNOSIS — I469 Cardiac arrest, cause unspecified: Secondary | ICD-10-CM | POA: Diagnosis not present

## 2015-12-04 DIAGNOSIS — I998 Other disorder of circulatory system: Secondary | ICD-10-CM | POA: Diagnosis present

## 2015-12-04 DIAGNOSIS — A419 Sepsis, unspecified organism: Secondary | ICD-10-CM | POA: Diagnosis not present

## 2015-12-04 DIAGNOSIS — I7092 Chronic total occlusion of artery of the extremities: Secondary | ICD-10-CM | POA: Diagnosis present

## 2015-12-04 DIAGNOSIS — Z7902 Long term (current) use of antithrombotics/antiplatelets: Secondary | ICD-10-CM

## 2015-12-04 DIAGNOSIS — K2970 Gastritis, unspecified, without bleeding: Secondary | ICD-10-CM | POA: Diagnosis present

## 2015-12-04 DIAGNOSIS — E785 Hyperlipidemia, unspecified: Secondary | ICD-10-CM | POA: Diagnosis present

## 2015-12-04 DIAGNOSIS — Z79899 Other long term (current) drug therapy: Secondary | ICD-10-CM

## 2015-12-04 DIAGNOSIS — K560 Paralytic ileus: Secondary | ICD-10-CM | POA: Diagnosis not present

## 2015-12-04 DIAGNOSIS — D696 Thrombocytopenia, unspecified: Secondary | ICD-10-CM | POA: Diagnosis present

## 2015-12-04 DIAGNOSIS — I9581 Postprocedural hypotension: Secondary | ICD-10-CM | POA: Diagnosis not present

## 2015-12-04 DIAGNOSIS — Z87891 Personal history of nicotine dependence: Secondary | ICD-10-CM

## 2015-12-04 DIAGNOSIS — E1122 Type 2 diabetes mellitus with diabetic chronic kidney disease: Secondary | ICD-10-CM | POA: Diagnosis present

## 2015-12-04 DIAGNOSIS — Z9911 Dependence on respirator [ventilator] status: Secondary | ICD-10-CM

## 2015-12-04 DIAGNOSIS — I429 Cardiomyopathy, unspecified: Secondary | ICD-10-CM | POA: Diagnosis present

## 2015-12-04 DIAGNOSIS — Z681 Body mass index (BMI) 19 or less, adult: Secondary | ICD-10-CM

## 2015-12-04 DIAGNOSIS — F0390 Unspecified dementia without behavioral disturbance: Secondary | ICD-10-CM | POA: Diagnosis present

## 2015-12-04 DIAGNOSIS — I340 Nonrheumatic mitral (valve) insufficiency: Secondary | ICD-10-CM | POA: Diagnosis present

## 2015-12-04 DIAGNOSIS — J9601 Acute respiratory failure with hypoxia: Secondary | ICD-10-CM | POA: Diagnosis not present

## 2015-12-04 DIAGNOSIS — R410 Disorientation, unspecified: Secondary | ICD-10-CM | POA: Diagnosis not present

## 2015-12-04 DIAGNOSIS — E46 Unspecified protein-calorie malnutrition: Secondary | ICD-10-CM | POA: Diagnosis present

## 2015-12-04 DIAGNOSIS — I130 Hypertensive heart and chronic kidney disease with heart failure and stage 1 through stage 4 chronic kidney disease, or unspecified chronic kidney disease: Secondary | ICD-10-CM | POA: Diagnosis present

## 2015-12-04 DIAGNOSIS — J449 Chronic obstructive pulmonary disease, unspecified: Secondary | ICD-10-CM | POA: Diagnosis present

## 2015-12-04 DIAGNOSIS — S1083XA Contusion of other specified part of neck, initial encounter: Secondary | ICD-10-CM | POA: Diagnosis not present

## 2015-12-04 DIAGNOSIS — E876 Hypokalemia: Secondary | ICD-10-CM | POA: Diagnosis not present

## 2015-12-04 DIAGNOSIS — I2699 Other pulmonary embolism without acute cor pulmonale: Secondary | ICD-10-CM | POA: Diagnosis not present

## 2015-12-04 DIAGNOSIS — Y845 Insertion of gastric or duodenal sound as the cause of abnormal reaction of the patient, or of later complication, without mention of misadventure at the time of the procedure: Secondary | ICD-10-CM | POA: Diagnosis not present

## 2015-12-04 DIAGNOSIS — E869 Volume depletion, unspecified: Secondary | ICD-10-CM | POA: Diagnosis not present

## 2015-12-04 DIAGNOSIS — I959 Hypotension, unspecified: Secondary | ICD-10-CM | POA: Diagnosis not present

## 2015-12-04 DIAGNOSIS — I5043 Acute on chronic combined systolic (congestive) and diastolic (congestive) heart failure: Secondary | ICD-10-CM | POA: Diagnosis not present

## 2015-12-04 DIAGNOSIS — J690 Pneumonitis due to inhalation of food and vomit: Secondary | ICD-10-CM | POA: Diagnosis not present

## 2015-12-04 DIAGNOSIS — E1159 Type 2 diabetes mellitus with other circulatory complications: Secondary | ICD-10-CM | POA: Diagnosis present

## 2015-12-04 DIAGNOSIS — D6489 Other specified anemias: Secondary | ICD-10-CM | POA: Diagnosis not present

## 2015-12-04 DIAGNOSIS — R339 Retention of urine, unspecified: Secondary | ICD-10-CM | POA: Diagnosis not present

## 2015-12-04 DIAGNOSIS — I214 Non-ST elevation (NSTEMI) myocardial infarction: Secondary | ICD-10-CM | POA: Diagnosis not present

## 2015-12-04 DIAGNOSIS — E1165 Type 2 diabetes mellitus with hyperglycemia: Secondary | ICD-10-CM | POA: Diagnosis present

## 2015-12-04 DIAGNOSIS — N170 Acute kidney failure with tubular necrosis: Secondary | ICD-10-CM | POA: Diagnosis not present

## 2015-12-04 DIAGNOSIS — R6521 Severe sepsis with septic shock: Secondary | ICD-10-CM | POA: Diagnosis not present

## 2015-12-04 DIAGNOSIS — Y95 Nosocomial condition: Secondary | ICD-10-CM | POA: Diagnosis not present

## 2015-12-04 DIAGNOSIS — G40909 Epilepsy, unspecified, not intractable, without status epilepticus: Secondary | ICD-10-CM | POA: Diagnosis present

## 2015-12-04 DIAGNOSIS — D62 Acute posthemorrhagic anemia: Secondary | ICD-10-CM | POA: Diagnosis not present

## 2015-12-04 DIAGNOSIS — I63431 Cerebral infarction due to embolism of right posterior cerebral artery: Secondary | ICD-10-CM | POA: Diagnosis not present

## 2015-12-04 DIAGNOSIS — Z8249 Family history of ischemic heart disease and other diseases of the circulatory system: Secondary | ICD-10-CM

## 2015-12-04 DIAGNOSIS — Z8546 Personal history of malignant neoplasm of prostate: Secondary | ICD-10-CM

## 2015-12-04 DIAGNOSIS — E1152 Type 2 diabetes mellitus with diabetic peripheral angiopathy with gangrene: Secondary | ICD-10-CM | POA: Diagnosis present

## 2015-12-04 DIAGNOSIS — T82898A Other specified complication of vascular prosthetic devices, implants and grafts, initial encounter: Secondary | ICD-10-CM | POA: Diagnosis present

## 2015-12-04 DIAGNOSIS — Y92238 Other place in hospital as the place of occurrence of the external cause: Secondary | ICD-10-CM | POA: Diagnosis not present

## 2015-12-04 DIAGNOSIS — E870 Hyperosmolality and hypernatremia: Secondary | ICD-10-CM | POA: Diagnosis present

## 2015-12-04 DIAGNOSIS — I272 Other secondary pulmonary hypertension: Secondary | ICD-10-CM | POA: Diagnosis present

## 2015-12-04 DIAGNOSIS — K9172 Accidental puncture and laceration of a digestive system organ or structure during other procedure: Secondary | ICD-10-CM | POA: Diagnosis not present

## 2015-12-04 DIAGNOSIS — N189 Chronic kidney disease, unspecified: Secondary | ICD-10-CM | POA: Diagnosis present

## 2015-12-04 DIAGNOSIS — I472 Ventricular tachycardia: Secondary | ICD-10-CM | POA: Diagnosis not present

## 2015-12-04 DIAGNOSIS — T508X5A Adverse effect of diagnostic agents, initial encounter: Secondary | ICD-10-CM | POA: Diagnosis not present

## 2015-12-04 DIAGNOSIS — R570 Cardiogenic shock: Secondary | ICD-10-CM | POA: Diagnosis not present

## 2015-12-04 DIAGNOSIS — G9341 Metabolic encephalopathy: Secondary | ICD-10-CM | POA: Diagnosis not present

## 2015-12-04 DIAGNOSIS — E872 Acidosis: Secondary | ICD-10-CM | POA: Diagnosis not present

## 2015-12-04 NOTE — ED Notes (Addendum)
Pt reports left lower leg pain tonight with no known injury; st pain from knee radiating down; denies hx of same; took hydrocodone 1tab PTA; pt denies any other c/o at present; no swelling noted, +PP; pt with right AKA; pt was seen here 2 days ago for same and had negative u/s

## 2015-12-05 ENCOUNTER — Emergency Department: Payer: PPO

## 2015-12-05 ENCOUNTER — Encounter
Admission: EM | Disposition: A | Discharge status: Nursing Home (Includes ICF) | Payer: Self-pay | Source: Home / Self Care | Attending: Vascular Surgery

## 2015-12-05 ENCOUNTER — Inpatient Hospital Stay
Admission: EM | Admit: 2015-12-05 | Discharge: 2016-01-07 | DRG: 003 | Disposition: A | Discharge status: Other Acute Inpatient Hospital | Payer: PPO | Attending: Vascular Surgery | Admitting: Vascular Surgery

## 2015-12-05 DIAGNOSIS — J189 Pneumonia, unspecified organism: Secondary | ICD-10-CM | POA: Diagnosis not present

## 2015-12-05 DIAGNOSIS — R579 Shock, unspecified: Secondary | ICD-10-CM | POA: Diagnosis not present

## 2015-12-05 DIAGNOSIS — S1093XD Contusion of unspecified part of neck, subsequent encounter: Secondary | ICD-10-CM | POA: Diagnosis not present

## 2015-12-05 DIAGNOSIS — E1159 Type 2 diabetes mellitus with other circulatory complications: Secondary | ICD-10-CM | POA: Diagnosis present

## 2015-12-05 DIAGNOSIS — Z85118 Personal history of other malignant neoplasm of bronchus and lung: Secondary | ICD-10-CM | POA: Diagnosis not present

## 2015-12-05 DIAGNOSIS — Y833 Surgical operation with formation of external stoma as the cause of abnormal reaction of the patient, or of later complication, without mention of misadventure at the time of the procedure: Secondary | ICD-10-CM | POA: Diagnosis present

## 2015-12-05 DIAGNOSIS — I70262 Atherosclerosis of native arteries of extremities with gangrene, left leg: Secondary | ICD-10-CM | POA: Diagnosis present

## 2015-12-05 DIAGNOSIS — Z8249 Family history of ischemic heart disease and other diseases of the circulatory system: Secondary | ICD-10-CM | POA: Diagnosis not present

## 2015-12-05 DIAGNOSIS — J449 Chronic obstructive pulmonary disease, unspecified: Secondary | ICD-10-CM | POA: Diagnosis present

## 2015-12-05 DIAGNOSIS — K668 Other specified disorders of peritoneum: Secondary | ICD-10-CM | POA: Insufficient documentation

## 2015-12-05 DIAGNOSIS — Z01818 Encounter for other preprocedural examination: Secondary | ICD-10-CM | POA: Diagnosis not present

## 2015-12-05 DIAGNOSIS — R570 Cardiogenic shock: Secondary | ICD-10-CM | POA: Diagnosis not present

## 2015-12-05 DIAGNOSIS — I214 Non-ST elevation (NSTEMI) myocardial infarction: Secondary | ICD-10-CM | POA: Diagnosis not present

## 2015-12-05 DIAGNOSIS — Z681 Body mass index (BMI) 19 or less, adult: Secondary | ICD-10-CM | POA: Diagnosis not present

## 2015-12-05 DIAGNOSIS — S1083XA Contusion of other specified part of neck, initial encounter: Secondary | ICD-10-CM | POA: Diagnosis not present

## 2015-12-05 DIAGNOSIS — E872 Acidosis: Secondary | ICD-10-CM | POA: Diagnosis not present

## 2015-12-05 DIAGNOSIS — T82898A Other specified complication of vascular prosthetic devices, implants and grafts, initial encounter: Secondary | ICD-10-CM

## 2015-12-05 DIAGNOSIS — R4182 Altered mental status, unspecified: Secondary | ICD-10-CM

## 2015-12-05 DIAGNOSIS — Z87891 Personal history of nicotine dependence: Secondary | ICD-10-CM | POA: Diagnosis not present

## 2015-12-05 DIAGNOSIS — L8992 Pressure ulcer of unspecified site, stage 2: Secondary | ICD-10-CM | POA: Diagnosis present

## 2015-12-05 DIAGNOSIS — I959 Hypotension, unspecified: Secondary | ICD-10-CM | POA: Diagnosis not present

## 2015-12-05 DIAGNOSIS — T17908A Unspecified foreign body in respiratory tract, part unspecified causing other injury, initial encounter: Secondary | ICD-10-CM | POA: Diagnosis not present

## 2015-12-05 DIAGNOSIS — E876 Hypokalemia: Secondary | ICD-10-CM | POA: Diagnosis not present

## 2015-12-05 DIAGNOSIS — Z8673 Personal history of transient ischemic attack (TIA), and cerebral infarction without residual deficits: Secondary | ICD-10-CM | POA: Diagnosis not present

## 2015-12-05 DIAGNOSIS — Z452 Encounter for adjustment and management of vascular access device: Secondary | ICD-10-CM

## 2015-12-05 DIAGNOSIS — Z930 Tracheostomy status: Secondary | ICD-10-CM | POA: Diagnosis not present

## 2015-12-05 DIAGNOSIS — E1152 Type 2 diabetes mellitus with diabetic peripheral angiopathy with gangrene: Secondary | ICD-10-CM | POA: Diagnosis present

## 2015-12-05 DIAGNOSIS — Y92238 Other place in hospital as the place of occurrence of the external cause: Secondary | ICD-10-CM | POA: Diagnosis not present

## 2015-12-05 DIAGNOSIS — Z794 Long term (current) use of insulin: Secondary | ICD-10-CM | POA: Diagnosis not present

## 2015-12-05 DIAGNOSIS — I340 Nonrheumatic mitral (valve) insufficiency: Secondary | ICD-10-CM | POA: Diagnosis present

## 2015-12-05 DIAGNOSIS — R6520 Severe sepsis without septic shock: Secondary | ICD-10-CM

## 2015-12-05 DIAGNOSIS — I9581 Postprocedural hypotension: Secondary | ICD-10-CM | POA: Diagnosis not present

## 2015-12-05 DIAGNOSIS — N170 Acute kidney failure with tubular necrosis: Secondary | ICD-10-CM | POA: Diagnosis not present

## 2015-12-05 DIAGNOSIS — R339 Retention of urine, unspecified: Secondary | ICD-10-CM | POA: Insufficient documentation

## 2015-12-05 DIAGNOSIS — D6489 Other specified anemias: Secondary | ICD-10-CM | POA: Diagnosis not present

## 2015-12-05 DIAGNOSIS — I472 Ventricular tachycardia: Secondary | ICD-10-CM | POA: Diagnosis not present

## 2015-12-05 DIAGNOSIS — J9600 Acute respiratory failure, unspecified whether with hypoxia or hypercapnia: Secondary | ICD-10-CM | POA: Diagnosis not present

## 2015-12-05 DIAGNOSIS — R140 Abdominal distension (gaseous): Secondary | ICD-10-CM | POA: Diagnosis not present

## 2015-12-05 DIAGNOSIS — K567 Ileus, unspecified: Secondary | ICD-10-CM | POA: Diagnosis not present

## 2015-12-05 DIAGNOSIS — L8990 Pressure ulcer of unspecified site, unspecified stage: Secondary | ICD-10-CM | POA: Insufficient documentation

## 2015-12-05 DIAGNOSIS — I2699 Other pulmonary embolism without acute cor pulmonale: Secondary | ICD-10-CM | POA: Diagnosis not present

## 2015-12-05 DIAGNOSIS — S1093XA Contusion of unspecified part of neck, initial encounter: Secondary | ICD-10-CM | POA: Diagnosis not present

## 2015-12-05 DIAGNOSIS — I480 Paroxysmal atrial fibrillation: Secondary | ICD-10-CM | POA: Diagnosis not present

## 2015-12-05 DIAGNOSIS — F0390 Unspecified dementia without behavioral disturbance: Secondary | ICD-10-CM | POA: Diagnosis present

## 2015-12-05 DIAGNOSIS — E869 Volume depletion, unspecified: Secondary | ICD-10-CM | POA: Diagnosis not present

## 2015-12-05 DIAGNOSIS — E46 Unspecified protein-calorie malnutrition: Secondary | ICD-10-CM | POA: Diagnosis present

## 2015-12-05 DIAGNOSIS — Z902 Acquired absence of lung [part of]: Secondary | ICD-10-CM | POA: Diagnosis not present

## 2015-12-05 DIAGNOSIS — G9340 Encephalopathy, unspecified: Secondary | ICD-10-CM | POA: Diagnosis not present

## 2015-12-05 DIAGNOSIS — T17908D Unspecified foreign body in respiratory tract, part unspecified causing other injury, subsequent encounter: Secondary | ICD-10-CM | POA: Diagnosis not present

## 2015-12-05 DIAGNOSIS — I469 Cardiac arrest, cause unspecified: Secondary | ICD-10-CM | POA: Diagnosis not present

## 2015-12-05 DIAGNOSIS — I429 Cardiomyopathy, unspecified: Secondary | ICD-10-CM | POA: Diagnosis present

## 2015-12-05 DIAGNOSIS — J9602 Acute respiratory failure with hypercapnia: Secondary | ICD-10-CM | POA: Diagnosis not present

## 2015-12-05 DIAGNOSIS — Z951 Presence of aortocoronary bypass graft: Secondary | ICD-10-CM | POA: Diagnosis not present

## 2015-12-05 DIAGNOSIS — N189 Chronic kidney disease, unspecified: Secondary | ICD-10-CM | POA: Diagnosis present

## 2015-12-05 DIAGNOSIS — K2970 Gastritis, unspecified, without bleeding: Secondary | ICD-10-CM | POA: Diagnosis present

## 2015-12-05 DIAGNOSIS — K9172 Accidental puncture and laceration of a digestive system organ or structure during other procedure: Secondary | ICD-10-CM | POA: Diagnosis not present

## 2015-12-05 DIAGNOSIS — I5043 Acute on chronic combined systolic (congestive) and diastolic (congestive) heart failure: Secondary | ICD-10-CM | POA: Diagnosis not present

## 2015-12-05 DIAGNOSIS — G40909 Epilepsy, unspecified, not intractable, without status epilepticus: Secondary | ICD-10-CM | POA: Diagnosis present

## 2015-12-05 DIAGNOSIS — Z931 Gastrostomy status: Secondary | ICD-10-CM | POA: Diagnosis not present

## 2015-12-05 DIAGNOSIS — I7092 Chronic total occlusion of artery of the extremities: Secondary | ICD-10-CM | POA: Diagnosis present

## 2015-12-05 DIAGNOSIS — I63431 Cerebral infarction due to embolism of right posterior cerebral artery: Secondary | ICD-10-CM | POA: Diagnosis not present

## 2015-12-05 DIAGNOSIS — I998 Other disorder of circulatory system: Secondary | ICD-10-CM

## 2015-12-05 DIAGNOSIS — J690 Pneumonitis due to inhalation of food and vomit: Secondary | ICD-10-CM | POA: Diagnosis not present

## 2015-12-05 DIAGNOSIS — K560 Paralytic ileus: Secondary | ICD-10-CM | POA: Diagnosis not present

## 2015-12-05 DIAGNOSIS — Z8546 Personal history of malignant neoplasm of prostate: Secondary | ICD-10-CM | POA: Diagnosis not present

## 2015-12-05 DIAGNOSIS — R0902 Hypoxemia: Secondary | ICD-10-CM

## 2015-12-05 DIAGNOSIS — D62 Acute posthemorrhagic anemia: Secondary | ICD-10-CM | POA: Diagnosis not present

## 2015-12-05 DIAGNOSIS — Z89611 Acquired absence of right leg above knee: Secondary | ICD-10-CM | POA: Diagnosis not present

## 2015-12-05 DIAGNOSIS — Y832 Surgical operation with anastomosis, bypass or graft as the cause of abnormal reaction of the patient, or of later complication, without mention of misadventure at the time of the procedure: Secondary | ICD-10-CM | POA: Diagnosis present

## 2015-12-05 DIAGNOSIS — I071 Rheumatic tricuspid insufficiency: Secondary | ICD-10-CM | POA: Diagnosis present

## 2015-12-05 DIAGNOSIS — T82399D Other mechanical complication of unspecified vascular grafts, subsequent encounter: Secondary | ICD-10-CM | POA: Diagnosis not present

## 2015-12-05 DIAGNOSIS — Z79899 Other long term (current) drug therapy: Secondary | ICD-10-CM | POA: Diagnosis not present

## 2015-12-05 DIAGNOSIS — R7989 Other specified abnormal findings of blood chemistry: Secondary | ICD-10-CM | POA: Diagnosis not present

## 2015-12-05 DIAGNOSIS — Y845 Insertion of gastric or duodenal sound as the cause of abnormal reaction of the patient, or of later complication, without mention of misadventure at the time of the procedure: Secondary | ICD-10-CM | POA: Diagnosis not present

## 2015-12-05 DIAGNOSIS — Z955 Presence of coronary angioplasty implant and graft: Secondary | ICD-10-CM | POA: Diagnosis not present

## 2015-12-05 DIAGNOSIS — R130 Aphagia: Secondary | ICD-10-CM

## 2015-12-05 DIAGNOSIS — L89152 Pressure ulcer of sacral region, stage 2: Secondary | ICD-10-CM | POA: Diagnosis present

## 2015-12-05 DIAGNOSIS — E1165 Type 2 diabetes mellitus with hyperglycemia: Secondary | ICD-10-CM | POA: Diagnosis present

## 2015-12-05 DIAGNOSIS — E1122 Type 2 diabetes mellitus with diabetic chronic kidney disease: Secondary | ICD-10-CM | POA: Diagnosis present

## 2015-12-05 DIAGNOSIS — T82399A Other mechanical complication of unspecified vascular grafts, initial encounter: Secondary | ICD-10-CM | POA: Diagnosis not present

## 2015-12-05 DIAGNOSIS — J810 Acute pulmonary edema: Secondary | ICD-10-CM | POA: Diagnosis not present

## 2015-12-05 DIAGNOSIS — E870 Hyperosmolality and hypernatremia: Secondary | ICD-10-CM | POA: Diagnosis present

## 2015-12-05 DIAGNOSIS — A419 Sepsis, unspecified organism: Secondary | ICD-10-CM | POA: Diagnosis not present

## 2015-12-05 DIAGNOSIS — Z7902 Long term (current) use of antithrombotics/antiplatelets: Secondary | ICD-10-CM | POA: Diagnosis not present

## 2015-12-05 DIAGNOSIS — Z515 Encounter for palliative care: Secondary | ICD-10-CM | POA: Diagnosis not present

## 2015-12-05 DIAGNOSIS — Z9911 Dependence on respirator [ventilator] status: Secondary | ICD-10-CM | POA: Diagnosis present

## 2015-12-05 DIAGNOSIS — I6340 Cerebral infarction due to embolism of unspecified cerebral artery: Secondary | ICD-10-CM | POA: Diagnosis not present

## 2015-12-05 DIAGNOSIS — R1310 Dysphagia, unspecified: Secondary | ICD-10-CM

## 2015-12-05 DIAGNOSIS — J9601 Acute respiratory failure with hypoxia: Secondary | ICD-10-CM | POA: Diagnosis not present

## 2015-12-05 DIAGNOSIS — I272 Other secondary pulmonary hypertension: Secondary | ICD-10-CM | POA: Diagnosis present

## 2015-12-05 DIAGNOSIS — B9561 Methicillin susceptible Staphylococcus aureus infection as the cause of diseases classified elsewhere: Secondary | ICD-10-CM | POA: Diagnosis not present

## 2015-12-05 DIAGNOSIS — Y95 Nosocomial condition: Secondary | ICD-10-CM | POA: Diagnosis not present

## 2015-12-05 DIAGNOSIS — I639 Cerebral infarction, unspecified: Secondary | ICD-10-CM

## 2015-12-05 DIAGNOSIS — T508X5A Adverse effect of diagnostic agents, initial encounter: Secondary | ICD-10-CM | POA: Diagnosis not present

## 2015-12-05 DIAGNOSIS — Z4659 Encounter for fitting and adjustment of other gastrointestinal appliance and device: Secondary | ICD-10-CM

## 2015-12-05 DIAGNOSIS — G9341 Metabolic encephalopathy: Secondary | ICD-10-CM | POA: Diagnosis not present

## 2015-12-05 DIAGNOSIS — R0600 Dyspnea, unspecified: Secondary | ICD-10-CM

## 2015-12-05 DIAGNOSIS — D696 Thrombocytopenia, unspecified: Secondary | ICD-10-CM | POA: Diagnosis present

## 2015-12-05 DIAGNOSIS — J9690 Respiratory failure, unspecified, unspecified whether with hypoxia or hypercapnia: Secondary | ICD-10-CM

## 2015-12-05 DIAGNOSIS — T82868A Thrombosis of vascular prosthetic devices, implants and grafts, initial encounter: Secondary | ICD-10-CM | POA: Diagnosis present

## 2015-12-05 DIAGNOSIS — R638 Other symptoms and signs concerning food and fluid intake: Secondary | ICD-10-CM

## 2015-12-05 DIAGNOSIS — R6521 Severe sepsis with septic shock: Secondary | ICD-10-CM | POA: Diagnosis not present

## 2015-12-05 DIAGNOSIS — I130 Hypertensive heart and chronic kidney disease with heart failure and stage 1 through stage 4 chronic kidney disease, or unspecified chronic kidney disease: Secondary | ICD-10-CM | POA: Diagnosis present

## 2015-12-05 DIAGNOSIS — R0603 Acute respiratory distress: Secondary | ICD-10-CM

## 2015-12-05 DIAGNOSIS — E785 Hyperlipidemia, unspecified: Secondary | ICD-10-CM | POA: Diagnosis present

## 2015-12-05 DIAGNOSIS — R404 Transient alteration of awareness: Secondary | ICD-10-CM | POA: Diagnosis not present

## 2015-12-05 DIAGNOSIS — R410 Disorientation, unspecified: Secondary | ICD-10-CM | POA: Diagnosis not present

## 2015-12-05 DIAGNOSIS — I2510 Atherosclerotic heart disease of native coronary artery without angina pectoris: Secondary | ICD-10-CM | POA: Diagnosis present

## 2015-12-05 HISTORY — PX: PERIPHERAL VASCULAR CATHETERIZATION: SHX172C

## 2015-12-05 LAB — COMPREHENSIVE METABOLIC PANEL
ALT: 11 U/L — ABNORMAL LOW (ref 17–63)
ANION GAP: 6 (ref 5–15)
AST: 17 U/L (ref 15–41)
Albumin: 3.9 g/dL (ref 3.5–5.0)
Alkaline Phosphatase: 83 U/L (ref 38–126)
BUN: 15 mg/dL (ref 6–20)
CHLORIDE: 107 mmol/L (ref 101–111)
CO2: 24 mmol/L (ref 22–32)
Calcium: 9.1 mg/dL (ref 8.9–10.3)
Creatinine, Ser: 0.83 mg/dL (ref 0.61–1.24)
GFR calc non Af Amer: >60 mL/min (ref >60)
Glucose, Bld: 133 mg/dL — ABNORMAL HIGH (ref 65–99)
POTASSIUM: 4.1 mmol/L (ref 3.5–5.1)
SODIUM: 137 mmol/L (ref 135–145)
Total Bilirubin: 0.4 mg/dL (ref 0.3–1.2)
Total Protein: 8 g/dL (ref 6.5–8.1)

## 2015-12-05 LAB — HEPARIN LEVEL (UNFRACTIONATED)
HEPARIN UNFRACTIONATED: 0.7 [IU]/mL (ref 0.30–0.70)
Heparin Unfractionated: 1.17 IU/mL — ABNORMAL HIGH (ref 0.30–0.70)

## 2015-12-05 LAB — CBC
HCT: 31.2 % — ABNORMAL LOW (ref 40.0–52.0)
HCT: 34 % — ABNORMAL LOW (ref 40.0–52.0)
HCT: 32.8 % — ABNORMAL LOW (ref 40.0–52.0)
HEMOGLOBIN: 10.2 g/dL — AB (ref 13.0–18.0)
Hemoglobin: 9.9 g/dL — ABNORMAL LOW (ref 13.0–18.0)
Hemoglobin: 10.9 g/dL — ABNORMAL LOW (ref 13.0–18.0)
MCH: 25 pg — AB (ref 26.0–34.0)
MCH: 24.9 pg — ABNORMAL LOW (ref 26.0–34.0)
MCH: 24.7 pg — ABNORMAL LOW (ref 26.0–34.0)
MCHC: 31.8 g/dL — AB (ref 32.0–36.0)
MCHC: 32 g/dL (ref 32.0–36.0)
MCHC: 31 g/dL — ABNORMAL LOW (ref 32.0–36.0)
MCV: 78.4 fL — ABNORMAL LOW (ref 80.0–100.0)
MCV: 77.9 fL — ABNORMAL LOW (ref 80.0–100.0)
MCV: 79.7 fL — ABNORMAL LOW (ref 80.0–100.0)
PLATELETS: 195 10*3/uL (ref 150–440)
Platelets: 196 10*3/uL (ref 150–440)
Platelets: 164 10*3/uL (ref 150–440)
RBC: 3.98 MIL/uL — ABNORMAL LOW (ref 4.40–5.90)
RBC: 4.36 MIL/uL — ABNORMAL LOW (ref 4.40–5.90)
RBC: 4.11 MIL/uL — AB (ref 4.40–5.90)
RDW: 16.7 % — ABNORMAL HIGH (ref 11.5–14.5)
RDW: 17.2 % — AB (ref 11.5–14.5)
RDW: 16.8 % — ABNORMAL HIGH (ref 11.5–14.5)
WBC: 7.9 10*3/uL (ref 3.8–10.6)
WBC: 16.6 10*3/uL — AB (ref 3.8–10.6)
WBC: 15 10*3/uL — ABNORMAL HIGH (ref 3.8–10.6)

## 2015-12-05 LAB — GLUCOSE, CAPILLARY: Glucose-Capillary: 146 mg/dL — ABNORMAL HIGH (ref 65–99)

## 2015-12-05 LAB — APTT
APTT: 41 s — AB (ref 24–36)
APTT: 88 s — AB (ref 24–36)
APTT: 132 s — AB (ref 24–36)

## 2015-12-05 LAB — FIBRINOGEN
FIBRINOGEN: 253 mg/dL (ref 210–470)
Fibrinogen: 60 mg/dL — CL (ref 210–470)

## 2015-12-05 LAB — PROTIME-INR
INR: 1.99
PROTHROMBIN TIME: 22.5 s — AB (ref 11.4–15.0)

## 2015-12-05 SURGERY — LOWER EXTREMITY ANGIOGRAPHY
Anesthesia: Moderate Sedation

## 2015-12-05 MED ORDER — HEPARIN (PORCINE) IN NACL 2-0.9 UNIT/ML-% IJ SOLN
INTRAMUSCULAR | Status: AC
  Filled 2015-12-05: qty 1000

## 2015-12-05 MED ORDER — HYDRALAZINE HCL 20 MG/ML IJ SOLN
INTRAMUSCULAR | Status: AC
  Filled 2015-12-05: qty 1

## 2015-12-05 MED ORDER — IOHEXOL 300 MG/ML  SOLN
INTRAMUSCULAR | Status: DC | PRN
  Administered 2015-12-05: 65 mL via INTRA_ARTERIAL

## 2015-12-05 MED ORDER — HEPARIN (PORCINE) IN NACL 100-0.45 UNIT/ML-% IJ SOLN: 800.0000 [IU]/h | INTRAMUSCULAR | Status: DC

## 2015-12-05 MED ORDER — LABETALOL HCL 5 MG/ML IV SOLN
10.0000 mg | INTRAVENOUS | Status: DC | PRN
  Administered 2015-12-05 – 2015-12-11 (×2): 10 mg via INTRAVENOUS
  Filled 2015-12-05 (×2): qty 4

## 2015-12-05 MED ORDER — TENECTEPLASE 50 MG IV KIT
0.5000 mg/h | PACK | INTRAVENOUS | Status: DC
  Filled 2015-12-05: qty 0.5

## 2015-12-05 MED ORDER — MIDAZOLAM HCL 2 MG/2ML IJ SOLN
INTRAMUSCULAR | Status: AC
  Filled 2015-12-05: qty 4

## 2015-12-05 MED ORDER — CEFAZOLIN SODIUM 1-5 GM-% IV SOLN
1.0000 g | Freq: Three times a day (TID) | INTRAVENOUS | Status: DC
  Administered 2015-12-05 – 2015-12-06 (×2): 1 g via INTRAVENOUS
  Filled 2015-12-05 (×3): qty 50

## 2015-12-05 MED ORDER — LIDOCAINE HCL (PF) 1 % IJ SOLN
INTRAMUSCULAR | Status: AC
  Filled 2015-12-05: qty 30

## 2015-12-05 MED ORDER — MIDAZOLAM HCL 2 MG/2ML IJ SOLN
INTRAMUSCULAR | Status: AC
  Filled 2015-12-05: qty 2

## 2015-12-05 MED ORDER — ALUM & MAG HYDROXIDE-SIMETH 200-200-20 MG/5ML PO SUSP: 30.0000 mL | Freq: Four times a day (QID) | ORAL | Status: DC | PRN

## 2015-12-05 MED ORDER — SODIUM CHLORIDE 0.9 % IV SOLN
INTRAVENOUS | Status: DC
  Filled 2015-12-05: qty 100

## 2015-12-05 MED ORDER — FENTANYL CITRATE (PF) 100 MCG/2ML IJ SOLN
INTRAMUSCULAR | Status: AC
  Filled 2015-12-05: qty 2

## 2015-12-05 MED ORDER — MIDAZOLAM HCL 2 MG/2ML IJ SOLN: 1.0000 mg | INTRAMUSCULAR | Status: DC | PRN

## 2015-12-05 MED ORDER — HEPARIN SODIUM (PORCINE) 1000 UNIT/ML IJ SOLN
INTRAMUSCULAR | Status: AC
  Filled 2015-12-05: qty 1

## 2015-12-05 MED ORDER — ALTEPLASE 2 MG IJ SOLR
INTRAMUSCULAR | Status: AC
  Filled 2015-12-05: qty 8

## 2015-12-05 MED ORDER — HEPARIN SODIUM (PORCINE) 1000 UNIT/ML IJ SOLN
INTRAMUSCULAR | Status: DC | PRN
  Administered 2015-12-05: 2000 [IU] via INTRAVENOUS

## 2015-12-05 MED ORDER — SODIUM CHLORIDE 0.9 % IV SOLN
INTRAVENOUS | Status: DC
  Administered 2015-12-05 (×2): via INTRAVENOUS
  Filled 2015-12-05 (×4): qty 50

## 2015-12-05 MED ORDER — DEXTROSE-NACL 5-0.9 % IV SOLN
INTRAVENOUS | Status: DC
  Administered 2015-12-05 – 2015-12-06 (×2): via INTRAVENOUS

## 2015-12-05 MED ORDER — HEPARIN (PORCINE) IN NACL 100-0.45 UNIT/ML-% IJ SOLN
16.0000 [IU]/kg/h | Freq: Once | INTRAMUSCULAR | Status: AC
  Administered 2015-12-05: 16 [IU]/kg/h via INTRAVENOUS
  Filled 2015-12-05: qty 250

## 2015-12-05 MED ORDER — DIPHENHYDRAMINE HCL 50 MG/ML IJ SOLN
INTRAMUSCULAR | Status: AC
  Filled 2015-12-05: qty 1

## 2015-12-05 MED ORDER — SODIUM CHLORIDE 0.9 % IV SOLN
INTRAVENOUS | Status: DC
  Filled 2015-12-05 (×2): qty 100

## 2015-12-05 MED ORDER — FENTANYL CITRATE (PF) 100 MCG/2ML IJ SOLN
INTRAMUSCULAR | Status: DC | PRN
  Administered 2015-12-05 (×4): 50 ug via INTRAVENOUS

## 2015-12-05 MED ORDER — SODIUM CHLORIDE 0.9% FLUSH: 3.0000 mL | INTRAVENOUS | Status: DC | PRN

## 2015-12-05 MED ORDER — ONDANSETRON HCL 4 MG/2ML IJ SOLN: 4.0000 mg | Freq: Four times a day (QID) | INTRAMUSCULAR | Status: DC | PRN

## 2015-12-05 MED ORDER — ACETAMINOPHEN 650 MG RE SUPP
650.0000 mg | Freq: Four times a day (QID) | RECTAL | Status: DC | PRN
Start: 2015-12-05 — End: 2015-12-05

## 2015-12-05 MED ORDER — DEXTROSE 5 % IV SOLN
1.5000 g | Freq: Once | INTRAVENOUS | Status: AC
  Administered 2015-12-05: 1.5 g via INTRAVENOUS

## 2015-12-05 MED ORDER — HYDRALAZINE HCL 20 MG/ML IJ SOLN
20.0000 mg | Freq: Once | INTRAMUSCULAR | Status: AC
  Administered 2015-12-05: 20 mg via INTRAVENOUS

## 2015-12-05 MED ORDER — ASPIRIN EC 81 MG PO TBEC: 81.0000 mg | DELAYED_RELEASE_TABLET | Freq: Every day | ORAL | Status: DC

## 2015-12-05 MED ORDER — DIPHENHYDRAMINE HCL 50 MG/ML IJ SOLN
INTRAMUSCULAR | Status: DC | PRN
  Administered 2015-12-05: 50 mg via INTRAVENOUS

## 2015-12-05 MED ORDER — TENECTEPLASE 50 MG IV KIT
2.0000 mg/h | PACK | INTRAVENOUS | Status: DC
  Filled 2015-12-05: qty 0.5

## 2015-12-05 MED ORDER — ACETAMINOPHEN 325 MG PO TABS: 650.0000 mg | ORAL_TABLET | Freq: Four times a day (QID) | ORAL | Status: DC | PRN

## 2015-12-05 MED ORDER — MORPHINE SULFATE (PF) 4 MG/ML IV SOLN
5.0000 mg | INTRAVENOUS | Status: DC | PRN
  Administered 2015-12-05 – 2015-12-06 (×3): 5 mg via INTRAVENOUS
  Filled 2015-12-05 (×2): qty 2
  Filled 2015-12-05: qty 1

## 2015-12-05 MED ORDER — OXYCODONE-ACETAMINOPHEN 5-325 MG PO TABS
1.0000 | ORAL_TABLET | Freq: Once | ORAL | Status: AC
  Administered 2015-12-05: 1 via ORAL
  Filled 2015-12-05: qty 1

## 2015-12-05 MED ORDER — MORPHINE SULFATE (PF) 4 MG/ML IV SOLN: 3.0000 mg | INTRAVENOUS | Status: DC | PRN

## 2015-12-05 MED ORDER — HYDRALAZINE HCL 20 MG/ML IJ SOLN
INTRAMUSCULAR | Status: DC | PRN
  Administered 2015-12-05: 20 mg via INTRAVENOUS

## 2015-12-05 MED ORDER — ONDANSETRON HCL 4 MG PO TABS: 4.0000 mg | ORAL_TABLET | Freq: Four times a day (QID) | ORAL | Status: DC | PRN

## 2015-12-05 MED ORDER — NITROGLYCERIN IN D5W 200-5 MCG/ML-% IV SOLN: 5.0000 ug/min | INTRAVENOUS | Status: DC

## 2015-12-05 MED ORDER — ALTEPLASE 2 MG IJ SOLR: 2.0000 mg | Freq: Once | INTRAMUSCULAR | Status: DC

## 2015-12-05 MED ORDER — SODIUM CHLORIDE 0.9 % IV SOLN
INTRAVENOUS | Status: DC
  Administered 2015-12-05: 15:00:00 via INTRAVENOUS
  Filled 2015-12-05: qty 50

## 2015-12-05 MED ORDER — METOPROLOL TARTRATE 1 MG/ML IV SOLN
2.0000 mg | INTRAVENOUS | Status: AC | PRN
  Administered 2015-12-09 – 2015-12-11 (×2): 5 mg via INTRAVENOUS
  Filled 2015-12-05 (×2): qty 5

## 2015-12-05 MED ORDER — MIDAZOLAM HCL 2 MG/2ML IJ SOLN
INTRAMUSCULAR | Status: DC | PRN
  Administered 2015-12-05: 1 mg via INTRAVENOUS
  Administered 2015-12-05: 2 mg via INTRAVENOUS
  Administered 2015-12-05 (×3): 1 mg via INTRAVENOUS

## 2015-12-05 MED ORDER — HEPARIN (PORCINE) IN NACL 100-0.45 UNIT/ML-% IJ SOLN
400.0000 [IU]/h | INTRAMUSCULAR | Status: DC
  Administered 2015-12-05: 400 [IU]/h via INTRAVENOUS
  Filled 2015-12-05 (×2): qty 250

## 2015-12-05 MED ORDER — HYDROCODONE-ACETAMINOPHEN 5-325 MG PO TABS: 1.0000 | ORAL_TABLET | ORAL | Status: DC | PRN

## 2015-12-05 MED ORDER — IOHEXOL 350 MG/ML SOLN
125.0000 mL | Freq: Once | INTRAVENOUS | Status: AC | PRN
  Administered 2015-12-05: 125 mL via INTRAVENOUS

## 2015-12-05 MED ORDER — SODIUM CHLORIDE 0.9% FLUSH
3.0000 mL | Freq: Two times a day (BID) | INTRAVENOUS | Status: DC
  Administered 2015-12-05 – 2015-12-09 (×8): 3 mL via INTRAVENOUS

## 2015-12-05 MED ORDER — HYDRALAZINE HCL 20 MG/ML IJ SOLN: 5.0000 mg | INTRAMUSCULAR | Status: DC | PRN

## 2015-12-05 MED ORDER — ALTEPLASE 2 MG IJ SOLR
INTRAMUSCULAR | Status: DC | PRN
  Administered 2015-12-05: 8 mg

## 2015-12-05 SURGICAL SUPPLY — 27 items
BALLN ULTRV 018 6X100X75 (BALLOONS) ×4
BALLOON ULTRV 018 6X100X75 (BALLOONS) ×2 IMPLANT
CANNULA 5F STIFF (CANNULA) ×4 IMPLANT
CATH CXI SUPP ANG 4FR 135 (MICROCATHETER) ×2 IMPLANT
CATH CXI SUPP ANG 4FR 135CM (MICROCATHETER) ×4
CATH INFUS 135CMX50CM (CATHETERS) ×4 IMPLANT
CATH KA2 5FR 65CM (CATHETERS) ×4 IMPLANT
CATH PIG 70CM (CATHETERS) ×4 IMPLANT
DEVICE PRESTO INFLATION (MISCELLANEOUS) ×4 IMPLANT
DEVICE SOLENT OMNI 120CM (CATHETERS) ×4 IMPLANT
DEVICE SOLENT PROXI 90CM (CATHETERS) ×4 IMPLANT
DEVICE TORQUE (MISCELLANEOUS) ×4 IMPLANT
GLIDECATH ANGLED 4FR 120CM (CATHETERS) ×4 IMPLANT
GLIDEWIRE ANGLED SS 035X260CM (WIRE) ×4 IMPLANT
GUIDEWIRE SUPER STIFF .035X180 (WIRE) ×4 IMPLANT
KIT CATH CVC 3 LUMEN 7FR 8IN (MISCELLANEOUS) ×4 IMPLANT
PACK ANGIOGRAPHY (CUSTOM PROCEDURE TRAY) ×4 IMPLANT
SET INTRO CAPELLA COAXIAL (SET/KITS/TRAYS/PACK) ×4 IMPLANT
SHEATH ANL2 6FRX45 HC (SHEATH) ×4 IMPLANT
SHEATH BRITE TIP 5FRX11 (SHEATH) ×4 IMPLANT
SYR MEDRAD MARK V 150ML (SYRINGE) ×4 IMPLANT
TOWEL OR 17X26 4PK STRL BLUE (TOWEL DISPOSABLE) ×8 IMPLANT
TUBING CONTRAST HIGH PRESS 72 (TUBING) ×4 IMPLANT
WIRE G 018X200 V18 (WIRE) ×4 IMPLANT
WIRE G V18X300CM (WIRE) ×4 IMPLANT
WIRE J 3MM .035X145CM (WIRE) ×4 IMPLANT
WIRE MAGIC TORQUE 260C (WIRE) ×4 IMPLANT

## 2015-12-05 NOTE — Progress Notes (Signed)
ANTICOAGULATION CONSULT NOTE - Initial Consult  Pharmacy Consult for heparin drip Indication: femoropopliteal bypass graft occlusion  No Known Allergies  Patient Measurements: Height: '6\' 4"'$  (193 cm) Weight: 143 lb 8 oz (65.091 kg) IBW/kg (Calculated) : 86.8 Heparin Dosing Weight: 79.8kg  Vital Signs: Temp: 97.7 F (36.5 C) (02/10 1606) Temp Source: Axillary (02/10 1606) BP: 162/80 mmHg (02/10 1800) Pulse Rate: 101 (02/10 1800)  Labs:  Recent Labs  12/05/15 0344 12/05/15 1631  HGB 9.9* 10.9*  HCT 31.2* 34.0*  PLT 196 195  APTT 41* 88*  LABPROT 22.5*  --   INR 1.99  --   HEPARINUNFRC >3.60* 1.17*  CREATININE 0.83  --     Estimated Creatinine Clearance: 76.3 mL/min (by C-G formula based on Cr of 0.83).   Medical History: Past Medical History  Diagnosis Date  . Hyperlipidemia   . Hypertension   . Diabetes mellitus ([place])   . CAD (coronary artery disease)     a. s/p 2 v CABG in 2012 (LIMA-LAD & SVG-OM); b. cath 07/2012 s/p PCI/DES to ostial LCx and OM  . PAD (peripheral artery disease) ([place])     a. s/p right SFA stent; b. s/p right toe amputation 2011; c. s/p right AKA spring 2016  . Seizures ([place])   . Gangrene of foot ([place])   . Wheezing   . Lung cancer ([place])   . Prostate cancer ([place])   . Stroke [facility])     Medications:    Assessment: Hgb 9.9  plt 196 INR 1.99 aPTT 41  anti-Xa >3.6  Patient is apparently on Xarelto as outpatient. Will monitor aPTT and anti-Xa until levels correlate and then follow by anti-Xa.  Goal of Therapy:  Heparin level 0.3-0.7 units/ml Monitor platelets by anticoagulation protocol: Yes   Plan:  No bolus and start 16 mg/kg/hr per Dr. Delana Meyer. First heparin level and aPTT 6 hours after start of infusion.  2/10:  Heparin consult d/c'd, Dr Delana Meyer does not want pharmacy to titrate heparin drip.  Heparin is to run @ 400 units/hr thru catheter. Pt also has Alteplase continuous IV infusion currently running.   Danh Bayus  D 12/05/2015,6:44 PM

## 2015-12-05 NOTE — Progress Notes (Signed)
Fibrinogen 60, informed Dr. Lucky Cowboy.  Ordered to hold TPA and Heparin for 2 hrs to restart heparin at same dose and TPA @ .'5mg'$  hr.

## 2015-12-05 NOTE — ED Notes (Signed)
Pharmacy notified of Heparin order, report given to [facility] RN. Informed RN heparin will be sent with pt if heparin arrives prior to transporting patient to hospital room. RN verbally acknowledged.

## 2015-12-05 NOTE — OR Nursing (Signed)
Transferred to ICU 19 via bed, pt restless and complaining of pain, on arrival to unit noted 3 grade hematoma right neck central line site and dressing right groin half saturated with blood, no blood in urine bag.

## 2015-12-05 NOTE — Progress Notes (Addendum)
Md/Dew called for increasing hematoma R CVC, pt VSS, airway clear with slight deviation to left on RA sats 100%, no new orders received

## 2015-12-05 NOTE — Progress Notes (Addendum)
ANTICOAGULATION CONSULT NOTE - Initial Consult  Pharmacy Consult for heparin drip Indication: femoropopliteal bypass graft occlusion  No Known Allergies  Patient Measurements:   Heparin Dosing Weight: 79.8kg  Vital Signs: Temp: 97.5 F (36.4 C) (02/09 2349) Temp Source: Oral (02/09 2349) BP: 154/76 mmHg (02/10 0220) Pulse Rate: 75 (02/10 0220)  Labs:  Recent Labs  12/02/15 1032 12/02/15 1339 12/05/15 0344  HGB 10.5*  --  9.9*  HCT 32.1*  --  31.2*  PLT 204  --  196  CREATININE 0.88  --  0.83  TROPONINI 0.04* <0.03  --     Estimated Creatinine Clearance: 93.5 mL/min (by C-G formula based on Cr of 0.83).   Medical History: Past Medical History  Diagnosis Date  . Hyperlipidemia   . Hypertension   . Diabetes mellitus ([place])   . CAD (coronary artery disease)     a. s/p 2 v CABG in 2012 (LIMA-LAD & SVG-OM); b. cath 07/2012 s/p PCI/DES to ostial LCx and OM  . PAD (peripheral artery disease) ([place])     a. s/p right SFA stent; b. s/p right toe amputation 2011; c. s/p right AKA spring 2016  . Seizures ([place])   . Gangrene of foot ([place])   . Wheezing   . Lung cancer ([place])   . Prostate cancer ([place])   . Stroke [facility])     Medications:    Assessment: Hgb 9.9  plt 196 INR 1.99 aPTT 41  anti-Xa >3.6  Patient is apparently on Xarelto as outpatient. Will monitor aPTT and anti-Xa until levels correlate and then follow by anti-Xa.  Goal of Therapy:  Heparin level 0.3-0.7 units/ml Monitor platelets by anticoagulation protocol: Yes   Plan:  No bolus and start 16 mg/kg/hr per Dr. Delana Meyer. First heparin level and aPTT 6 hours after start of infusion.  Sianna Garofano,Adrik S 12/05/2015,5:48 AM

## 2015-12-05 NOTE — ED Notes (Signed)
Pt presents to ED with c/o left leg pain since last night. Daughter reports "his foot is cold." Pt is a diabetic, reports was seen here, US performed on patient. Pt reports intermittent pain begining upper thigh radiating down to toes. Pt has AKA to right leg. (+) pedal pulses, cold to touch. Pt denies other complaints at this time. Call bell within reach.

## 2015-12-05 NOTE — OR Nursing (Signed)
Heparin drip held for procedure per Dr Delana Meyer

## 2015-12-05 NOTE — H&P (Signed)
[facility] Admission History & Physical  MRN : [id number]  Stephen Camacho is a 70 y.o. (1946/07/09) male who presents with chief complaint of  Chief Complaint  Patient presents with  . Leg Pain  .  History of Present Illness: Our with increasing leg pain. He was seen 2 days ago in the ER noted to have a warm foot with good Doppler signals and a negative duplex for venous occlusive disease. He returned early this morning with increasing pain in his foot was now cold and there were minimal Doppler signals present. CT angios performed which demonstrated occlusion of his femoral-popliteal bypass. Bypass was placed in 2013.  Patient denies recent trauma, prolonged sedentary periods. No recent illnesses fever chills or changes in his health status. No recent chest pain.  Current Facility-Administered Medications  Medication Dose Route Frequency Provider Last Rate Last Dose  . acetaminophen (TYLENOL) tablet 650 mg  650 mg Oral Q6H PRN [name], MD       Or  . acetaminophen (TYLENOL) suppository 650 mg  650 mg Rectal Q6H PRN [name], MD      . alum & mag hydroxide-simeth (MAALOX/MYLANTA) 200-200-20 MG/5ML suspension 30 mL  30 mL Oral Q6H PRN [name], MD      . aspirin EC tablet 81 mg  81 mg Oral Daily [name], MD      . dextrose 5 %-0.9 % sodium chloride infusion   Intravenous Continuous [name], MD 75 mL/hr at 12/05/15 0645    . HYDROcodone-acetaminophen (NORCO/VICODIN) 5-325 MG per tablet 1-2 tablet  1-2 tablet Oral Q4H PRN [name], MD      . morphine 4 MG/ML injection 3 mg  3 mg Intravenous Q2H PRN [name], MD      . ondansetron [facility]) tablet 4 mg  4 mg Oral Q6H PRN [name], MD       Or  . ondansetron [facility]) injection 4 mg  4 mg Intravenous Q6H PRN [name], MD        Past Medical History  Diagnosis Date  . Hyperlipidemia   . Hypertension   . Diabetes mellitus ([place])    . CAD (coronary artery disease)     a. s/p 2 v CABG in 2012 (LIMA-LAD & SVG-OM); b. cath 07/2012 s/p PCI/DES to ostial LCx and OM  . PAD (peripheral artery disease) ([place])     a. s/p right SFA stent; b. s/p right toe amputation 2011; c. s/p right AKA spring 2016  . Seizures ([place])   . Gangrene of foot ([place])   . Wheezing   . Lung cancer ([place])   . Prostate cancer ([place])   . Stroke [facility])     Past Surgical History  Procedure Laterality Date  . Right aka  07-10-2014  . Left femoral popliteal bypass    . Right lung lobeectomy    . Coronary artery bypass graft      Social History Social History  Substance Use Topics  . Smoking status: Never Smoker   . Smokeless tobacco: None  . Alcohol Use: No    Family History Family History  Problem Relation Age of Onset  . Family history unknown: Yes   no family history of porphyria, bleeding or clotting disorders or autoimmune disease  No Known Allergies   REVIEW OF SYSTEMS (Negative unless checked)  Constitutional: '[]'$ Weight loss  '[]'$ Fever  '[]'$ Chills Cardiac: '[]'$ Chest pain   '[]'$ Chest pressure   '[]'$   Palpitations   '[]'$ Shortness of breath when laying flat   '[]'$ Shortness of breath at rest   '[]'$ Shortness of breath with exertion. Vascular:  '[x]'$ Pain in legs with walking   '[x]'$ Pain in legs at rest   '[]'$ Pain in legs when laying flat   '[]'$ Claudication   '[]'$ Pain in feet when walking  '[x]'$ Pain in feet at rest  '[x]'$ Pain in feet when laying flat   '[]'$ History of DVT   '[]'$ Phlebitis   '[]'$ Swelling in legs   '[]'$ Varicose veins   '[]'$ Non-healing ulcers Pulmonary:   '[]'$ Uses home oxygen   '[]'$ Productive cough   '[]'$ Hemoptysis   '[]'$ Wheeze  '[]'$ COPD   '[]'$ Asthma Neurologic:  '[]'$ Dizziness  '[]'$ Blackouts   '[]'$ Seizures   '[]'$ History of stroke   '[]'$ History of TIA  '[]'$ Aphasia   '[]'$ Temporary blindness   '[]'$ Dysphagia   '[]'$ Weakness or numbness in arms   '[]'$ Weakness or numbness in legs Musculoskeletal:  '[]'$ Arthritis   '[]'$ Joint swelling   '[]'$ Joint pain   '[]'$ Low back pain Hematologic:  '[]'$ Easy bruising  '[]'$ Easy bleeding    '[]'$ Hypercoagulable state   '[]'$ Anemic  '[]'$ Hepatitis Gastrointestinal:  '[]'$ Blood in stool   '[]'$ Vomiting blood  '[]'$ Gastroesophageal reflux/heartburn   '[]'$ Difficulty swallowing. Genitourinary:  '[]'$ Chronic kidney disease   '[]'$ Difficult urination  '[]'$ Frequent urination  '[]'$ Burning with urination   '[]'$ Blood in urine Skin:  '[]'$ Rashes   '[]'$ Ulcers   '[]'$ Wounds Psychological:  '[]'$ History of anxiety   '[]'$  History of major depression.  Physical Examination  Filed Vitals:   12/05/15 0330 12/05/15 0530 12/05/15 0600 12/05/15 0645  BP: 173/91 164/95 168/85 177/85  Pulse: 75 79 66 71  Temp:    97.8 F (36.6 C)  TempSrc:    Oral  Resp:    20  Height:    '6\' 4"'$  (1.93 m)  Weight:    65.091 kg (143 lb 8 oz)  SpO2: 100% 100% 100% 100%   Body mass index is 17.47 kg/(m^2). Gen: WD/WN, NAD Head: [place]/AT, No temporalis wasting. Prominent temp pulse not noted. Ear/Nose/Throat: Hearing grossly intact, nares w/o erythema or drainage, oropharynx w/o Erythema/Exudate,  Eyes: PERRLA, EOMI.  Neck: Supple, no nuchal rigidity.  No bruit or JVD.  Pulmonary:  Good air movement, clear to auscultation bilaterally, no use of accessory muscles.  Cardiac: RRR, normal S1, S2, no Murmurs, rubs or gallops. Vascular: Left leg is cool from the knee down motor function is intact Vessel Right Left  Radial Palpable Palpable  Ulnar Palpable Palpable  Brachial Palpable Palpable  Carotid Palpable, without bruit Palpable, without bruit  Aorta Not palpable N/A  Femoral Palpable Palpable  Popliteal  AKA   not Palpable  PT  AKA   not Palpable  DP  AKA   not Palpable   Gastrointestinal: soft, non-tender/non-distended. No guarding/reflex.  Musculoskeletal: M/S 5/5 throughout except right leg.  Extremities with ischemic changes on the left.  No deformity or atrophy.  Neurologic: CN 2-12 intact. Pain and light touch intact in extremities.  Symmetrical.  Speech is fluent. Motor exam as listed above. Psychiatric: Judgment intact, Mood & affect appropriate  for pt's clinical situation. Dermatologic: No rashes or ulcers noted.  No cellulitis or open wounds. Lymph : No Cervical, Axillary, or Inguinal lymphadenopathy.     CBC Lab Results  Component Value Date   WBC 7.9 12/05/2015   HGB 9.9* 12/05/2015   HCT 31.2* 12/05/2015   MCV 78.4* 12/05/2015   PLT 196 12/05/2015    BMET    Component Value Date/Time   NA 137 12/05/2015 0344   NA 138 10/25/2014  1331   K 4.1 12/05/2015 0344   K 3.9 10/25/2014 1331   CL 107 12/05/2015 0344   CL 109* 10/25/2014 1331   CO2 24 12/05/2015 0344   CO2 21 10/25/2014 1331   GLUCOSE 133* 12/05/2015 0344   GLUCOSE 232* 10/25/2014 1331   BUN 15 12/05/2015 0344   BUN 17 10/25/2014 1331   CREATININE 0.83 12/05/2015 0344   CREATININE 1.11 10/25/2014 1331   CALCIUM 9.1 12/05/2015 0344   CALCIUM 8.7 10/25/2014 1331   GFRNONAA >60 12/05/2015 0344   GFRNONAA >60 10/25/2014 1331   GFRNONAA 52* 07/08/2014 1051   GFRAA >60 12/05/2015 0344   GFRAA >60 10/25/2014 1331   GFRAA >60 07/08/2014 1051   Estimated Creatinine Clearance: 76.3 mL/min (by C-G formula based on Cr of 0.83).  COAG Lab Results  Component Value Date   INR 1.99 12/05/2015   INR 1.2 07/08/2014   INR 1.3 06/11/2014    Radiology Dg Chest 2 View  12/02/2015  CLINICAL DATA:  Short of breath since yesterday. EXAM: CHEST  2 VIEW COMPARISON:  CT chest 06/23/2015 FINDINGS: The lungs are hyperinflated likely secondary to COPD. Postsurgical changes at the lung apices bilaterally There is no focal parenchymal opacity. There is no pleural effusion or pneumothorax. The heart and mediastinal contours are unremarkable. There is evidence of prior CABG. The osseous structures are unremarkable. IMPRESSION: No active cardiopulmonary disease. Electronically Signed   By: Kathreen Devoid   On: 12/02/2015 11:10   Ct Angio Low Extrem Left W/cm &/or Wo/cm  12/05/2015  CLINICAL DATA:  Acute onset of left leg pain. Cold left foot. Personal history of left-sided  fem-pop bypass graft. Initial encounter. EXAM: CT ANGIOGRAPHY OF THE LEFT LOWER EXTREMITY TECHNIQUE: Multidetector CT imaging of the left lower extremity was performed using the standard protocol during bolus administration of intravenous contrast. Multiplanar CT image reconstructions and MIPs were obtained to evaluate the vascular anatomy. CONTRAST:  118m OMNIPAQUE IOHEXOL 350 MG/ML SOLN COMPARISON:  Left lower extremity venous Doppler ultrasound performed 12/02/2015 FINDINGS: There is complete occlusion of the patient's left-sided fem-pop bypass graft. This corresponds to the patient's acute symptoms. There is vague diffuse apparent aneurysmal dilatation along the course of the fem-pop bypass graft, which also appears completely occluded. 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 A small amount of blood flow is noted tracking into the profunda femoris artery and its branches, which tracks along small branch vessels distally to the level of the knee. Diffuse calcification is noted along the abdominal aorta and its branches. 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 Visualized small and large bowel loops are grossly unremarkable. The visualized musculature is grossly unremarkable in appearance. No acute osseous abnormalities are seen. No knee joint effusion is identified. Postoperative change is noted about the prostate bed. The bladder is mildly distended and grossly unremarkable. Review of the MIP images confirms the above findings. IMPRESSION: 1. Complete occlusion of the patient's left-sided fem-pop bypass graft, corresponding to the patient's acute symptoms. Underlying vague diffuse apparent aneurysmal dilatation along the course of the fem-pop bypass graft, which also appears completely occluded. 2. Underlying chronic incomplete occlusion of the native left  common femoral artery, and complete occlusion of the left superficial femoral artery. Small amount of blood flow tracks into the profunda femoris artery and its branches, which extends along a small branch vessels distally to the level of  the knee and likely explains residual left-sided pedal pulses. 3. Diffuse calcification along the abdominal aorta and its branches. 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 Critical Value/emergent results were called by telephone at the time of interpretation on 12/05/2015 at 4:43 am to Dr. Marjean Donna, who verbally acknowledged these results. Electronically Signed   By: Garald Balding M.D.   On: 12/05/2015 04:49   US Venous Img Lower Unilateral Left  12/02/2015  CLINICAL DATA:  70 year old presenting with a 2 day history of left calf pain. Personal history of right lower extremity above knee amputation. EXAM: LEFT LOWER EXTREMITY VENOUS DOPPLER ULTRASOUND TECHNIQUE: Gray-scale sonography with graded compression, as well as color Doppler and duplex ultrasound were performed to evaluate the lower extremity deep venous systems from the level of the common femoral vein and including the common femoral, femoral, profunda femoral, popliteal and calf veins including the posterior tibial, peroneal and gastrocnemius veins when visible. The superficial great saphenous vein was also interrogated. Spectral Doppler was utilized to evaluate flow at rest and with distal augmentation maneuvers in the common femoral, femoral and popliteal veins. COMPARISON:  None. FINDINGS: Contralateral Common Femoral Vein: Respiratory phasicity is normal and symmetric with the symptomatic side. No evidence of thrombus. Normal compressibility. Common Femoral Vein: No evidence of thrombus. Normal compressibility, respiratory phasicity and response to augmentation. Saphenofemoral Junction: No evidence of thrombus. Normal compressibility and flow on color Doppler imaging. Profunda Femoral  Vein: No evidence of thrombus. Normal compressibility and flow on color Doppler imaging. Femoral Vein: No evidence of thrombus. Normal compressibility, respiratory phasicity and response to augmentation. Popliteal Vein: No evidence of thrombus. Normal compressibility, respiratory phasicity and response to augmentation. Calf Veins: No evidence of thrombus. Normal compressibility and flow on color Doppler imaging. Superficial Great Saphenous Vein: Not evaluated. Venous Reflux:  Not evaluated. Other Findings: Arterial bypass graft in the left lower extremity which was not evaluated on this lower extremity venous examination. IMPRESSION: No evidence of left lower extremity DVT. Electronically Signed   By: Evangeline Dakin M.D.   On: 12/02/2015 12:02    Assessment/Plan 1.  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 The risks and benefits of been reviewed with the patient he has agreed to proceed as he requests everything be done to save his leg noting he has an above-knee amputation on the other side. Triple-lumen will be placed at that time and this was discussed as well. 2.  Hypertension:  His medications will be continued 3.  Diabetes mellitus:  Insulin sliding scale will be initiated and oral medications of been held 4.  Coronary artery disease:  EKG will be monitored nitrates will be administered as needed 5.  Hyperlipidemia:  Statins will be continued post procedure 6.  Lung carcinoma: This will be reviewed and may prevent lytic therapy overnight.   Asako Saliba, [name], MD  12/05/2015 7:43 AM

## 2015-12-05 NOTE — OR Nursing (Signed)
Activase drip bolus started for one hour. D5NS switched to blue port central line.

## 2015-12-05 NOTE — OR Nursing (Signed)
Hypertensive Dr Delana Meyer ordered Hydrazaline IV

## 2015-12-05 NOTE — ED Provider Notes (Signed)
[facility] Emergency Department Provider Note  ____________________________________________  Time seen: 3:40 AM  I have reviewed the triage vital signs and the nursing notes.   HISTORY  Chief Complaint Leg Pain      HPI Stephen Camacho is a 70 y.o. male presents with currently 8 out of 10 left leg pain 2 days with acute worsening yesterday. Patient denies any fever no dyspnea no chest pain. Note the patient was seen in the emergency department on 12/02/2015 similar complaint with Negative ultrasound for DVT of the lower extremity.    Past Medical History  Diagnosis Date  . Hyperlipidemia   . Hypertension   . Diabetes mellitus ([place])   . CAD (coronary artery disease)     a. s/p 2 v CABG in 2012 (LIMA-LAD & SVG-OM); b. cath 07/2012 s/p PCI/DES to ostial LCx and OM  . PAD (peripheral artery disease) ([place])     a. s/p right SFA stent; b. s/p right toe amputation 2011; c. s/p right AKA spring 2016  . Seizures ([place])   . Gangrene of foot ([place])   . Wheezing   . Lung cancer ([place])   . Prostate cancer ([place])   . Stroke [facility])     Patient Active Problem List   Diagnosis Date Noted  . Ischemic leg 12/05/2015  . Chest pain 06/24/2015  . 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 Gangrene ([place]) 07/22/2014  . PVD (peripheral vascular disease) ([place]) 07/21/2014  . PAD (peripheral artery disease) ([place]) 07/21/2014  . S/P AKA (above knee amputation) ([place]) 07/21/2014  . CAD (coronary artery disease) 07/21/2014  . Dyslipidemia 07/21/2014  . Essential hypertension, benign 07/21/2014    Past Surgical History  Procedure Laterality Date  . Right aka  07-10-2014  . Left femoral popliteal bypass    . Right lung lobeectomy    . Coronary artery bypass graft      Current Outpatient Rx  Name  Route  Sig  Dispense  Refill  . acetaminophen (TYLENOL) 650 MG CR tablet   Oral   Take 650 mg by mouth every 8 (eight) hours as needed for  pain.         Marland Kitchen aspirin 81 MG chewable tablet   Oral   Chew 81 mg by mouth daily.         Marland Kitchen atorvastatin (LIPITOR) 40 MG tablet   Oral   Take 40 mg by mouth daily at 6 PM.         . cilostazol (PLETAL) 50 MG tablet   Oral   Take 50 mg by mouth 2 (two) times daily.         . insulin glargine (LANTUS) 100 UNIT/ML injection   Subcutaneous   Inject 10 Units into the skin at bedtime.         . isosorbide mononitrate (IMDUR) 60 MG 24 hr tablet   Oral   Take 60 mg by mouth daily.         Marland Kitchen lisinopril (PRINIVIL,ZESTRIL) 10 MG tablet   Oral   Take 10 mg by mouth daily.         . meclizine (ANTIVERT) 25 MG tablet   Oral   Take 1 tablet (25 mg total) by mouth 3 (three) times daily as needed for dizziness.   30 tablet   0   . metFORMIN (GLUCOPHAGE) 500 MG tablet   Oral   Take by mouth 2 (two) times daily with a meal.         .  metoprolol (LOPRESSOR) 50 MG tablet   Oral   Take 50 mg by mouth 2 (two) times daily.         . nitroGLYCERIN (NITROSTAT) 0.4 MG SL tablet   Sublingual   Place 0.4 mg under the tongue every 5 (five) minutes as needed for chest pain.         Marland Kitchen ondansetron (ZOFRAN) 4 MG tablet   Oral   Take 1 tablet (4 mg total) by mouth every 8 (eight) hours as needed for nausea or vomiting.   21 tablet   0   . rivaroxaban (XARELTO) 20 MG TABS tablet   Oral   Take 20 mg by mouth daily with supper.         . traMADol (ULTRAM) 50 MG tablet   Oral   Take 25-50 mg by mouth every 6 (six) hours as needed for severe pain.            Allergies No known drug allergies  Family History  Problem Relation Age of Onset  . Family history unknown: Yes    Social History Social History  Substance Use Topics  . Smoking status: Never Smoker   . Smokeless tobacco: None  . Alcohol Use: No    Review of Systems  Constitutional: Negative for fever. Eyes: Negative for visual changes. ENT: Negative for sore throat. Cardiovascular: Negative for  chest pain. Respiratory: Negative for shortness of breath. Gastrointestinal: Negative for abdominal pain, vomiting and diarrhea. Genitourinary: Negative for dysuria. Musculoskeletal: Negative for back pain. Positive for left leg pain Skin: Negative for rash. Neurological: Negative for headaches, focal weakness or numbness.   10-point ROS otherwise negative.  ____________________________________________   PHYSICAL EXAM:  VITAL SIGNS: ED Triage Vitals  Enc Vitals Group     BP 12/04/15 2349 189/84 mmHg     Pulse Rate 12/04/15 2349 83     Resp 12/04/15 2349 20     Temp 12/04/15 2349 97.5 F (36.4 C)     Temp Source 12/04/15 2349 Oral     SpO2 12/04/15 2349 98 %     Weight --      Height --      Head Cir --      Peak Flow --      Pain Score 12/04/15 2349 10     Pain Loc --      Pain Edu? --      Excl. in [place]? --      Constitutional: Alert and oriented. Well appearing and in no distress. Eyes: Conjunctivae are normal. PERRL. Normal extraocular movements. ENT   Head: Normocephalic and atraumatic.   Nose: No congestion/rhinnorhea.   Mouth/Throat: Mucous membranes are moist.   Neck: No stridor. Hematological/Lymphatic/Immunilogical: No cervical lymphadenopathy. Cardiovascular: Normal rate, regular rhythm. Normal and symmetric distal pulses are present in all extremities. No murmurs, rubs, or gallops. Respiratory: Normal respiratory effort without tachypnea nor retractions. Breath sounds are clear and equal bilaterally. No wheezes/rales/rhonchi. Gastrointestinal: Soft and nontender. No distention. There is no CVA tenderness. Genitourinary: deferred Musculoskeletal: Right leg AKA. Left foot/lower leg cool to the touch, no palpable or dopplerable PT/DP or popliteal pulse. Neurologic:  Normal speech and language. No gross focal neurologic deficits are appreciated. Speech is normal.  Skin:  Skin is warm, dry and intact. No rash noted. Psychiatric: Mood and affect are  normal. Speech and behavior are normal. Patient exhibits appropriate insight and judgment.  ____________________________________________    LABS (pertinent positives/negatives) Labs Reviewed  CBC - Abnormal; Notable  for the following:    RBC 3.98 (*)    Hemoglobin 9.9 (*)    HCT 31.2 (*)    MCV 78.4 (*)    MCH 25.0 (*)    MCHC 31.8 (*)    RDW 16.7 (*)    All other components within normal limits  COMPREHENSIVE METABOLIC PANEL - Abnormal; Notable for the following:    Glucose, Bld 133 (*)    ALT 11 (*)    All other components within normal limits        RADIOLOGY  CT Angio Low Extrem Left W/Cm &/Or Wo/Cm (Final result) Result time: 12/05/15 04:49:50   Final result by Rad Results In Interface (12/05/15 04:49:50)   Narrative:   CLINICAL DATA: Acute onset of left leg pain. Cold left foot. Personal history of left-sided fem-pop bypass graft. Initial encounter.  EXAM: CT ANGIOGRAPHY OF THE LEFT LOWER EXTREMITY  TECHNIQUE: Multidetector CT imaging of the left lower extremity was performed using the standard protocol during bolus administration of intravenous contrast. Multiplanar CT image reconstructions and MIPs were obtained to evaluate the vascular anatomy.  CONTRAST: 162m OMNIPAQUE IOHEXOL 350 MG/ML SOLN  COMPARISON: Left lower extremity venous Doppler ultrasound performed 12/02/2015  FINDINGS: There is complete occlusion of the patient's left-sided fem-pop bypass graft. This corresponds to the patient's acute symptoms. There is vague diffuse apparent aneurysmal dilatation along the course of the fem-pop bypass graft, which also appears completely occluded.  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 A small amount of blood flow is noted tracking into the profunda femoris artery and its branches, which tracks along small branch vessels distally  to the level of the knee.  Diffuse calcification is noted along the abdominal aorta and its branches. 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 Visualized small and large bowel loops are grossly unremarkable.  The visualized musculature is grossly unremarkable in appearance. No acute osseous abnormalities are seen. No knee joint effusion is identified. Postoperative change is noted about the prostate bed. The bladder is mildly distended and grossly unremarkable.  Review of the MIP images confirms the above findings.  IMPRESSION: 1. Complete occlusion of the patient's left-sided fem-pop bypass graft, corresponding to the patient's acute symptoms. Underlying vague diffuse apparent aneurysmal dilatation along the course of the fem-pop bypass graft, which also appears completely occluded. 2. 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 Small amount of blood flow tracks into the profunda femoris artery and its branches, which extends along a small branch vessels distally to the level of the knee and likely explains residual left-sided pedal pulses. 3. Diffuse calcification along the abdominal aorta and its branches. 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  Critical Value/emergent results were called by telephone at the time of interpretation on 12/05/2015 at 4:43 am to Dr. RMarjean Donna who verbally acknowledged these results.   Electronically Signed By: JGarald BaldingM.D. On: 12/05/2015 04:49          Critical Care performed: CRITICAL CARE Performed by: BMarjean DonnaN   Total critical care time: 45 minutes  Critical care time was exclusive of separately billable procedures and treating other patients.  Critical care was necessary to treat or prevent imminent or life-threatening  deterioration.  Critical care was time spent personally by me on the following activities: development of  treatment plan with patient and/or surrogate as well as nursing, discussions with consultants, evaluation of patient's response to treatment, examination of patient, obtaining history from patient or surrogate, ordering and performing treatments and interventions, ordering and review of laboratory studies, ordering and review of radiographic studies, pulse oximetry and re-evaluation of patient's condition.   ____________________________________________   INITIAL IMPRESSION / ASSESSMENT AND PLAN / ED COURSE  Pertinent labs & imaging results that were available during my care of the patient were reviewed by me and considered in my medical decision making (see chart for details).  History of physical exam concerning for left leg arterial occlusion as such CT angiogram performed which revealed findings consistent with complete occlusion of the patient's left femoropopliteal bypass graft. Dr. Hinton Lovely notified of clinical and CT scan findings and concurs with starting heparin drip now. Patient and his daughter informed of all local findings  ____________________________________________   FINAL CLINICAL IMPRESSION(S) / ED DIAGNOSES  Final diagnoses:  Occlusion of arterial bypass graft, initial encounter [facility])      Gregor Hams, MD 12/05/15 [phone]

## 2015-12-05 NOTE — Op Note (Signed)
[facility]  Percutaneous Study/Intervention Procedural Note   Date of Surgery: 12/05/2015  Surgeon:Amahia Madonia, [name]   Pre-operative Diagnosis: Ischemic left leg; Complication of vascular device with left  thrombosis of femoral below-knee bypass  Post-operative diagnosis:  Same  Procedure(s) Performed:  1.  Abdominal aortogram  2.  Third order catheter placement left leg  3.  Additional third order catheter placement  4.  Infusion of TPA for thrombolysis  5.  Mechanical thrombectomy of left SFA  6.  Mechanical thrombectomy of left profunda femoris  7.  Percutaneous transluminal angioplasty left profunda femoris artery  Anesthesia: Conscious sedation was administered under my direct supervision. IV Versed plus fentanyl were utilized. Continuous ECG, pulse oximetry and blood pressure was monitored throughout the entire procedure. A total of 6 milligrams of Versed and 400 micrograms of fentanyl were utilized.  Conscious sedation was  for a total of 1 hour 20 minutes minutes.  Sheath: 6 [place] Ansell right common femoral  Contrast: 70 cc  Fluoroscopy Time: Approximately 20 minutes  Indications:  Patient presents with ischemia of the left leg. Examination is consistent with occlusion of the femoral to below-knee bypass he is therefore undergoing angiography with the hope for intervention and limb salvage  Procedure:  Besnik Febus a 70 y.o. male who was identified and appropriate procedural time out was performed.  The patient was then placed supine on the table and prepped and draped in the usual sterile fashion.  Ultrasound was used to evaluate the right common femoral artery.  It was patent .  A digital ultrasound image was acquired.  A Seldinger needle was used to access the right common femoral artery under direct ultrasound guidance and a permanent image was performed.  A 0.035 J wire was advanced without resistance and a 5Fr sheath was placed.     Pigtail catheter was advanced to the level of T12 and AP projection of the aorta was obtained. Pigtail catheter is then repositioned to above the bifurcation and an RAO projection of the pelvis was obtained. Stiff Glidewire and pigtail catheter were used to cross the aortic bifurcation and the catheter was advanced down to the distal external iliac artery where and LAO projection of the femoral bifurcation was obtained. This demonstrated occlusion of the femoral to below-knee bypass as well as a string sign in the popliteal with an occlusion somewhat more distally.  Catheter and wire were then negotiated into the profunda femoris and a hand injection contrast was used to demonstrate the distal anatomy representing third order catheter placement.  Subsequently the AngioJet was used to lace the profunda femoris occlusion with 4 mg of TPA.  The wire was then negotiated into the bypass graft and hand injection contrast through the catheter was used to verify the bypass graft was thrombosed as well as advanced down to the distal anastomosis where distal runoff was obtained demonstrating thrombus within the tibioperoneal trunk as well as the anterior tibial. Subsequently a V-18 wire was advanced down into the tibial vessels and the AngioJet was once again utilized to lace this area with 4 mg of TPA.  The the 18 wire was then left in position and a second VAT wire was then negotiated into the profunda femoris the AngioJet was then used to perform mechanical thrombectomy of the fundus femoris. Several passes were made. And subsequently follow-up imaging demonstrated improvement with residual narrowings and a 6 x 10 balloon was advanced across the lesions and insufflated to 8 atm for 1  minute. Follow-up imaging demonstrated flow had been restored to the profunda femoris. At this time it was elected to initiate TPA infusion overnight and the infusion wire was then advanced down into the bypass graft approximately  the first 5 cm his left above the origin of the graft to the TPA will also infuse into the profunda femoris 50 cm infusion length was selected.   The sheath and infusion catheter are then secured to the skin of the thigh on the right with a large sterile dressing 0 silk suture and Steri-Strips and Tegaderm. TPA infusion is then initiated  Findings:   Aortogram: Aortogram demonstrates aneurysmal changes but no hemodynamically significant lesions.  Right Lower Extremity:  Right common and external iliac artery are patent with mild aneurysmal dilatation.  Left Lower Extremity:  The right common and external iliac arteries are patent with mild aneurysmal dilatation. The common femoral is patent however the profunda femoris demonstrates a string sign at its origin and then occludes more distally the SFA is occluded flush and the bypass graft is a flush occlusion. After opening the profunda femoris there is now a significant improvement in flow. There is thrombus within the bypass graft and distal outflow is very poor with thrombus within the tibials.    Disposition: Patient was taken to the intensive care unit in guarded condition there is a high likelihood he will lose his left leg given the massive thrombus burden   Aadi Bordner, [name] 12/05/2015,4:46 PM

## 2015-12-05 NOTE — H&P (Signed)
[facility] History & Physical Update  The patient was interviewed and re-examined.  The patient's previous History and Physical has been reviewed and is unchanged.  There is no change in the plan of care. We plan to proceed with the scheduled procedure.  Schnier, [name], MD  12/05/2015, 2:47 PM

## 2015-12-06 ENCOUNTER — Encounter: Payer: Self-pay | Admitting: Adult Health

## 2015-12-06 ENCOUNTER — Encounter: Payer: Self-pay | Admitting: Anesthesiology

## 2015-12-06 ENCOUNTER — Inpatient Hospital Stay: Payer: PPO

## 2015-12-06 ENCOUNTER — Encounter
Admission: EM | Disposition: A | Discharge status: Nursing Home (Includes ICF) | Payer: Self-pay | Source: Home / Self Care | Attending: Vascular Surgery

## 2015-12-06 DIAGNOSIS — J9600 Acute respiratory failure, unspecified whether with hypoxia or hypercapnia: Secondary | ICD-10-CM

## 2015-12-06 DIAGNOSIS — N179 Acute kidney failure, unspecified: Secondary | ICD-10-CM

## 2015-12-06 DIAGNOSIS — A419 Sepsis, unspecified organism: Secondary | ICD-10-CM

## 2015-12-06 DIAGNOSIS — R6521 Severe sepsis with septic shock: Secondary | ICD-10-CM

## 2015-12-06 DIAGNOSIS — R571 Hypovolemic shock: Secondary | ICD-10-CM

## 2015-12-06 DIAGNOSIS — I998 Other disorder of circulatory system: Secondary | ICD-10-CM

## 2015-12-06 DIAGNOSIS — E872 Acidosis: Secondary | ICD-10-CM

## 2015-12-06 DIAGNOSIS — I214 Non-ST elevation (NSTEMI) myocardial infarction: Secondary | ICD-10-CM

## 2015-12-06 HISTORY — PX: PERIPHERAL VASCULAR CATHETERIZATION: SHX172C

## 2015-12-06 LAB — BLOOD GAS, ARTERIAL
ACID-BASE DEFICIT: 23.4 mmol/L — AB (ref 0.0–2.0)
ACID-BASE DEFICIT: 15.1 mmol/L — AB (ref 0.0–2.0)
ACID-BASE DEFICIT: 1.7 mmol/L (ref 0.0–2.0)
ALLENS TEST (PASS/FAIL): POSITIVE — AB
Acid-Base Excess: 1.5 mmol/L (ref 0.0–3.0)
Allens test (pass/fail): POSITIVE — AB
Bicarbonate: 6.7 mEq/L — ABNORMAL LOW (ref 21.0–28.0)
Bicarbonate: 12.9 mEq/L — ABNORMAL LOW (ref 21.0–28.0)
Bicarbonate: 21.2 mEq/L (ref 21.0–28.0)
Bicarbonate: 25.9 mEq/L (ref 21.0–28.0)
FIO2: 0.6
FIO2: 0.4
FIO2: 0.4
FIO2: 0.4
MECHVT: 500 mL
MECHVT: 500 mL
Mechanical Rate: 20
Mechanical Rate: 13
O2 SAT: 99.6 %
O2 SAT: 99.6 %
O2 Saturation: 99.8 %
O2 Saturation: 98.1 %
PCO2 ART: 26 mmHg — AB (ref 32.0–48.0)
PEEP/CPAP: 5 cmH2O
PEEP/CPAP: 5 cmH2O
PEEP: 5 cmH2O
PEEP: 5 cmH2O
PH ART: 6.94 — AB (ref 7.350–7.450)
PH ART: 7.15 — AB (ref 7.350–7.450)
PO2 ART: 168 mmHg — AB (ref 83.0–108.0)
Patient temperature: 37
Patient temperature: 37
Patient temperature: 37
Patient temperature: 37
RATE: 15 resp/min
RATE: 20 resp/min
VT: 500 mL
VT: 500 mL
pCO2 arterial: 31 mmHg — ABNORMAL LOW (ref 32.0–48.0)
pCO2 arterial: 37 mmHg (ref 32.0–48.0)
pCO2 arterial: 39 mmHg (ref 32.0–48.0)
pH, Arterial: 7.52 — ABNORMAL HIGH (ref 7.350–7.450)
pH, Arterial: 7.43 (ref 7.350–7.450)
pO2, Arterial: 338 mmHg — ABNORMAL HIGH (ref 83.0–108.0)
pO2, Arterial: 134 mmHg — ABNORMAL HIGH (ref 83.0–108.0)
pO2, Arterial: 173 mmHg — ABNORMAL HIGH (ref 83.0–108.0)

## 2015-12-06 LAB — LACTIC ACID, PLASMA
Lactic Acid, Venous: 17.8 mmol/L (ref 0.5–2.0)
Lactic Acid, Venous: 7.5 mmol/L (ref 0.5–2.0)

## 2015-12-06 LAB — FIBRINOGEN
FIBRINOGEN: 73 mg/dL — AB (ref 210–470)
FIBRINOGEN: 79 mg/dL — AB (ref 210–470)

## 2015-12-06 LAB — BASIC METABOLIC PANEL
ANION GAP: 10 (ref 5–15)
Anion gap: 10 (ref 5–15)
BUN: 18 mg/dL (ref 6–20)
BUN: 31 mg/dL — ABNORMAL HIGH (ref 6–20)
CHLORIDE: 104 mmol/L (ref 101–111)
CHLORIDE: 108 mmol/L (ref 101–111)
CO2: 23 mmol/L (ref 22–32)
CO2: 24 mmol/L (ref 22–32)
CREATININE: 2.69 mg/dL — AB (ref 0.61–1.24)
Calcium: 8.3 mg/dL — ABNORMAL LOW (ref 8.9–10.3)
Calcium: 7.3 mg/dL — ABNORMAL LOW (ref 8.9–10.3)
Creatinine, Ser: 1.45 mg/dL — ABNORMAL HIGH (ref 0.61–1.24)
GFR calc Af Amer: 55 mL/min — ABNORMAL LOW (ref >60)
GFR calc non Af Amer: 47 mL/min — ABNORMAL LOW (ref >60)
GFR calc non Af Amer: 22 mL/min — ABNORMAL LOW (ref >60)
GFR, EST AFRICAN AMERICAN: 26 mL/min — AB (ref >60)
Glucose, Bld: 447 mg/dL — ABNORMAL HIGH (ref 65–99)
Glucose, Bld: 176 mg/dL — ABNORMAL HIGH (ref 65–99)
POTASSIUM: 4.5 mmol/L (ref 3.5–5.1)
Potassium: 4.1 mmol/L (ref 3.5–5.1)
SODIUM: 137 mmol/L (ref 135–145)
Sodium: 142 mmol/L (ref 135–145)

## 2015-12-06 LAB — MAGNESIUM: Magnesium: 1.4 mg/dL — ABNORMAL LOW (ref 1.7–2.4)

## 2015-12-06 LAB — GLUCOSE, CAPILLARY
GLUCOSE-CAPILLARY: 387 mg/dL — AB (ref 65–99)
GLUCOSE-CAPILLARY: 383 mg/dL — AB (ref 65–99)
GLUCOSE-CAPILLARY: 214 mg/dL — AB (ref 65–99)
Glucose-Capillary: 158 mg/dL — ABNORMAL HIGH (ref 65–99)
Glucose-Capillary: 166 mg/dL — ABNORMAL HIGH (ref 65–99)

## 2015-12-06 LAB — CBC
HCT: 27.4 % — ABNORMAL LOW (ref 40.0–52.0)
HEMATOCRIT: 15 % — AB (ref 40.0–52.0)
HEMOGLOBIN: 8.7 g/dL — AB (ref 13.0–18.0)
HEMOGLOBIN: 4.8 g/dL — AB (ref 13.0–18.0)
MCH: 25.4 pg — ABNORMAL LOW (ref 26.0–34.0)
MCH: 25 pg — ABNORMAL LOW (ref 26.0–34.0)
MCHC: 31.9 g/dL — ABNORMAL LOW (ref 32.0–36.0)
MCHC: 32 g/dL (ref 32.0–36.0)
MCV: 79.6 fL — ABNORMAL LOW (ref 80.0–100.0)
MCV: 78.1 fL — AB (ref 80.0–100.0)
Platelets: 156 10*3/uL (ref 150–440)
Platelets: 85 10*3/uL — ABNORMAL LOW (ref 150–440)
RBC: 3.44 MIL/uL — AB (ref 4.40–5.90)
RBC: 1.92 MIL/uL — ABNORMAL LOW (ref 4.40–5.90)
RDW: 17 % — ABNORMAL HIGH (ref 11.5–14.5)
RDW: 16.6 % — ABNORMAL HIGH (ref 11.5–14.5)
WBC: 10 10*3/uL (ref 3.8–10.6)
WBC: 10.5 10*3/uL (ref 3.8–10.6)

## 2015-12-06 LAB — PREPARE RBC (CROSSMATCH)

## 2015-12-06 LAB — HEPARIN LEVEL (UNFRACTIONATED)
HEPARIN UNFRACTIONATED: 0.46 [IU]/mL (ref 0.30–0.70)
HEPARIN UNFRACTIONATED: 0.36 [IU]/mL (ref 0.30–0.70)
HEPARIN UNFRACTIONATED: 0.12 [IU]/mL — AB (ref 0.30–0.70)

## 2015-12-06 LAB — PHOSPHORUS: PHOSPHORUS: 4 mg/dL (ref 2.5–4.6)

## 2015-12-06 LAB — APTT
APTT: 79 s — AB (ref 24–36)
aPTT: 72 seconds — ABNORMAL HIGH (ref 24–36)

## 2015-12-06 LAB — ABO/RH: ABO/RH(D): B POS

## 2015-12-06 LAB — PROCALCITONIN: PROCALCITONIN: 0.54 ng/mL

## 2015-12-06 LAB — TROPONIN I: TROPONIN I: 2.41 ng/mL — AB (ref <0.031)

## 2015-12-06 SURGERY — LOWER EXTREMITY ANGIOGRAPHY
Anesthesia: Moderate Sedation | Laterality: Left

## 2015-12-06 MED ORDER — SENNOSIDES 8.8 MG/5ML PO SYRP: 5.0000 mL | ORAL_SOLUTION | Freq: Two times a day (BID) | ORAL | Status: DC | PRN

## 2015-12-06 MED ORDER — LIDOCAINE-EPINEPHRINE (PF) 1 %-1:200000 IJ SOLN
INTRAMUSCULAR | Status: AC
  Filled 2015-12-06: qty 30

## 2015-12-06 MED ORDER — LACTATED RINGERS IV SOLN
INTRAVENOUS | Status: DC
  Administered 2015-12-06: 100 mL via INTRAVENOUS
  Administered 2015-12-08 – 2015-12-15 (×14): via INTRAVENOUS

## 2015-12-06 MED ORDER — HEPARIN SODIUM (PORCINE) 1000 UNIT/ML IJ SOLN
INTRAMUSCULAR | Status: AC
  Filled 2015-12-06: qty 1

## 2015-12-06 MED ORDER — FENTANYL CITRATE (PF) 100 MCG/2ML IJ SOLN
100.0000 ug | Freq: Once | INTRAMUSCULAR | Status: AC
  Administered 2015-12-06: 100 ug via INTRAVENOUS

## 2015-12-06 MED ORDER — BISACODYL 10 MG RE SUPP: 10.0000 mg | Freq: Once | RECTAL | Status: DC

## 2015-12-06 MED ORDER — FENTANYL CITRATE (PF) 100 MCG/2ML IJ SOLN
50.0000 ug | INTRAMUSCULAR | Status: AC | PRN
  Administered 2015-12-06 – 2015-12-07 (×3): 50 ug via INTRAVENOUS
  Filled 2015-12-06 (×2): qty 2

## 2015-12-06 MED ORDER — ANTISEPTIC ORAL RINSE SOLUTION (CORINZ)
7.0000 mL | Freq: Four times a day (QID) | OROMUCOSAL | Status: DC
  Administered 2015-12-06 – 2015-12-14 (×29): 7 mL via OROMUCOSAL
  Filled 2015-12-06 (×35): qty 7

## 2015-12-06 MED ORDER — FENTANYL CITRATE (PF) 100 MCG/2ML IJ SOLN
50.0000 ug | INTRAMUSCULAR | Status: DC | PRN
  Administered 2015-12-07 – 2015-12-10 (×6): 50 ug via INTRAVENOUS
  Filled 2015-12-06 (×6): qty 2

## 2015-12-06 MED ORDER — MIDAZOLAM HCL 2 MG/2ML IJ SOLN
INTRAMUSCULAR | Status: AC
  Filled 2015-12-06: qty 2

## 2015-12-06 MED ORDER — APIXABAN 5 MG PO TABS: 5.0000 mg | ORAL_TABLET | Freq: Two times a day (BID) | ORAL | Status: DC

## 2015-12-06 MED ORDER — FENTANYL CITRATE (PF) 100 MCG/2ML IJ SOLN
INTRAMUSCULAR | Status: AC
  Filled 2015-12-06: qty 2

## 2015-12-06 MED ORDER — MIDAZOLAM HCL 2 MG/2ML IJ SOLN
2.0000 mg | Freq: Once | INTRAMUSCULAR | Status: AC
  Administered 2015-12-06: 2 mg via INTRAVENOUS

## 2015-12-06 MED ORDER — ACETAMINOPHEN 325 MG PO TABS
650.0000 mg | ORAL_TABLET | Freq: Four times a day (QID) | ORAL | Status: DC | PRN
  Administered 2015-12-08 – 2015-12-24 (×17): 650 mg
  Filled 2015-12-06 (×17): qty 2

## 2015-12-06 MED ORDER — HEPARIN (PORCINE) IN NACL 2-0.9 UNIT/ML-% IJ SOLN
INTRAMUSCULAR | Status: AC
  Filled 2015-12-06: qty 1500

## 2015-12-06 MED ORDER — MIDAZOLAM HCL 2 MG/2ML IJ SOLN
INTRAMUSCULAR | Status: DC | PRN
  Administered 2015-12-06 (×2): 0.5 mg via INTRAVENOUS
  Administered 2015-12-06: 2 mg via INTRAVENOUS
  Administered 2015-12-06: 1 mg via INTRAVENOUS
  Administered 2015-12-06: 0.5 mg via INTRAVENOUS

## 2015-12-06 MED ORDER — TIROFIBAN HCL IN NACL 5-0.9 MG/100ML-% IV SOLN: 0.1500 ug/kg/min | INTRAVENOUS | Status: DC

## 2015-12-06 MED ORDER — BISACODYL 10 MG RE SUPP: 10.0000 mg | Freq: Every day | RECTAL | Status: DC | PRN

## 2015-12-06 MED ORDER — INSULIN ASPART 100 UNIT/ML ~~LOC~~ SOLN
0.0000 [IU] | SUBCUTANEOUS | Status: DC
  Administered 2015-12-06 (×2): 15 [IU] via SUBCUTANEOUS
  Administered 2015-12-06: 3 [IU] via SUBCUTANEOUS
  Administered 2015-12-07: 5 [IU] via SUBCUTANEOUS
  Administered 2015-12-07 – 2015-12-08 (×6): 3 [IU] via SUBCUTANEOUS
  Administered 2015-12-08: 5 [IU] via SUBCUTANEOUS
  Administered 2015-12-08 (×4): 3 [IU] via SUBCUTANEOUS
  Administered 2015-12-09: 5 [IU] via SUBCUTANEOUS
  Administered 2015-12-09 (×2): 3 [IU] via SUBCUTANEOUS
  Administered 2015-12-09: 2 [IU] via SUBCUTANEOUS
  Administered 2015-12-09 – 2015-12-10 (×4): 3 [IU] via SUBCUTANEOUS
  Administered 2015-12-10: 2 [IU] via SUBCUTANEOUS
  Administered 2015-12-10: 3 [IU] via SUBCUTANEOUS
  Administered 2015-12-10 – 2015-12-11 (×2): 5 [IU] via SUBCUTANEOUS
  Administered 2015-12-11 (×3): 3 [IU] via SUBCUTANEOUS
  Administered 2015-12-11: 5 [IU] via SUBCUTANEOUS
  Administered 2015-12-11: 3 [IU] via SUBCUTANEOUS
  Administered 2015-12-12: 5 [IU] via SUBCUTANEOUS
  Administered 2015-12-12 (×3): 3 [IU] via SUBCUTANEOUS
  Administered 2015-12-12: 5 [IU] via SUBCUTANEOUS
  Administered 2015-12-13 (×2): 8 [IU] via SUBCUTANEOUS
  Administered 2015-12-13 (×2): 5 [IU] via SUBCUTANEOUS
  Administered 2015-12-13: 8 [IU] via SUBCUTANEOUS
  Administered 2015-12-14 (×2): 5 [IU] via SUBCUTANEOUS
  Administered 2015-12-14 – 2015-12-15 (×7): 3 [IU] via SUBCUTANEOUS
  Administered 2015-12-15 (×3): 2 [IU] via SUBCUTANEOUS
  Administered 2015-12-16: 5 [IU] via SUBCUTANEOUS
  Administered 2015-12-16: 3 [IU] via SUBCUTANEOUS
  Administered 2015-12-16 (×2): 5 [IU] via SUBCUTANEOUS
  Administered 2015-12-16: 3 [IU] via SUBCUTANEOUS
  Administered 2015-12-16: 5 [IU] via SUBCUTANEOUS
  Administered 2015-12-17: 3 [IU] via SUBCUTANEOUS
  Administered 2015-12-17 (×2): 5 [IU] via SUBCUTANEOUS
  Filled 2015-12-06: qty 3
  Filled 2015-12-06: qty 15
  Filled 2015-12-06: qty 3
  Filled 2015-12-06: qty 5
  Filled 2015-12-06: qty 2
  Filled 2015-12-06: qty 5
  Filled 2015-12-06: qty 15
  Filled 2015-12-06: qty 2
  Filled 2015-12-06 (×2): qty 3
  Filled 2015-12-06: qty 2
  Filled 2015-12-06 (×2): qty 3
  Filled 2015-12-06: qty 2
  Filled 2015-12-06: qty 5
  Filled 2015-12-06: qty 3
  Filled 2015-12-06: qty 5
  Filled 2015-12-06: qty 1
  Filled 2015-12-06 (×3): qty 3
  Filled 2015-12-06: qty 5
  Filled 2015-12-06: qty 8
  Filled 2015-12-06 (×3): qty 3
  Filled 2015-12-06: qty 5
  Filled 2015-12-06: qty 3
  Filled 2015-12-06: qty 8
  Filled 2015-12-06 (×2): qty 3
  Filled 2015-12-06: qty 2
  Filled 2015-12-06: qty 1
  Filled 2015-12-06: qty 3
  Filled 2015-12-06: qty 5
  Filled 2015-12-06 (×3): qty 3
  Filled 2015-12-06: qty 5
  Filled 2015-12-06: qty 2
  Filled 2015-12-06 (×2): qty 3
  Filled 2015-12-06 (×4): qty 5
  Filled 2015-12-06: qty 8
  Filled 2015-12-06 (×2): qty 3
  Filled 2015-12-06: qty 5
  Filled 2015-12-06 (×2): qty 3
  Filled 2015-12-06: qty 5
  Filled 2015-12-06 (×2): qty 3
  Filled 2015-12-06: qty 5
  Filled 2015-12-06 (×3): qty 3
  Filled 2015-12-06 (×2): qty 5
  Filled 2015-12-06 (×2): qty 3
  Filled 2015-12-06: qty 5
  Filled 2015-12-06: qty 3

## 2015-12-06 MED ORDER — NOREPINEPHRINE BITARTRATE 1 MG/ML IV SOLN
2.0000 ug/min | INTRAVENOUS | Status: DC
  Administered 2015-12-06: 4 ug/min via INTRAVENOUS
  Administered 2015-12-06: 3 ug/min via INTRAVENOUS
  Administered 2015-12-06: 5 ug/min via INTRAVENOUS
  Administered 2015-12-06: 1 ug/min via INTRAVENOUS
  Administered 2015-12-06: 2 ug/min via INTRAVENOUS
  Filled 2015-12-06: qty 4

## 2015-12-06 MED ORDER — SODIUM CHLORIDE 0.9 % IV SOLN
Freq: Once | INTRAVENOUS | Status: AC
  Administered 2015-12-06: 10 mL via INTRAVENOUS

## 2015-12-06 MED ORDER — VANCOMYCIN HCL IN DEXTROSE 1-5 GM/200ML-% IV SOLN
1000.0000 mg | Freq: Once | INTRAVENOUS | Status: AC
  Administered 2015-12-06: 1000 mg via INTRAVENOUS
  Filled 2015-12-06: qty 200

## 2015-12-06 MED ORDER — SODIUM CHLORIDE 0.9 % IV SOLN
INTRAVENOUS | Status: DC
  Administered 2015-12-06: 11:00:00 via INTRAVENOUS

## 2015-12-06 MED ORDER — SODIUM BICARBONATE 8.4 % IV SOLN
100.0000 meq | Freq: Once | INTRAVENOUS | Status: DC
  Administered 2015-12-06: 100 meq via INTRAVENOUS

## 2015-12-06 MED ORDER — SODIUM CHLORIDE 0.9 % IV BOLUS (SEPSIS)
1000.0000 mL | Freq: Once | INTRAVENOUS | Status: AC
  Administered 2015-12-06: 1000 mL via INTRAVENOUS

## 2015-12-06 MED ORDER — LACTATED RINGERS IV BOLUS (SEPSIS)
1000.0000 mL | Freq: Once | INTRAVENOUS | Status: AC
  Administered 2015-12-06: 1000 mL via INTRAVENOUS

## 2015-12-06 MED ORDER — IPRATROPIUM-ALBUTEROL 0.5-2.5 (3) MG/3ML IN SOLN
3.0000 mL | Freq: Four times a day (QID) | RESPIRATORY_TRACT | Status: DC
  Administered 2015-12-06 – 2015-12-13 (×27): 3 mL via RESPIRATORY_TRACT
  Filled 2015-12-06 (×30): qty 3

## 2015-12-06 MED ORDER — DIPHENHYDRAMINE HCL 50 MG/ML IJ SOLN
INTRAMUSCULAR | Status: AC
  Filled 2015-12-06: qty 1

## 2015-12-06 MED ORDER — SODIUM BICARBONATE 8.4 % IV SOLN: 100.0000 meq | Freq: Once | INTRAVENOUS | Status: DC

## 2015-12-06 MED ORDER — CHLORHEXIDINE GLUCONATE 0.12% ORAL RINSE (MEDLINE KIT)
15.0000 mL | Freq: Two times a day (BID) | OROMUCOSAL | Status: DC
  Administered 2015-12-06 – 2015-12-13 (×15): 15 mL via OROMUCOSAL
  Filled 2015-12-06 (×19): qty 15

## 2015-12-06 MED ORDER — LACTATED RINGERS IV BOLUS (SEPSIS): 1000.0000 mL | Freq: Once | INTRAVENOUS | Status: DC

## 2015-12-06 MED ORDER — VECURONIUM BROMIDE 10 MG IV SOLR
10.0000 mg | Freq: Once | INTRAVENOUS | Status: AC
Start: 2015-12-06 — End: 2015-12-06
  Administered 2015-12-06: 10 mg via INTRAVENOUS

## 2015-12-06 MED ORDER — PANTOPRAZOLE SODIUM 40 MG IV SOLR
40.0000 mg | Freq: Every day | INTRAVENOUS | Status: DC
  Administered 2015-12-06 – 2015-12-10 (×5): 40 mg via INTRAVENOUS
  Filled 2015-12-06 (×5): qty 40

## 2015-12-06 MED ORDER — ALBUTEROL SULFATE (2.5 MG/3ML) 0.083% IN NEBU
2.5000 mg | INHALATION_SOLUTION | RESPIRATORY_TRACT | Status: DC | PRN
  Filled 2015-12-06: qty 3

## 2015-12-06 MED ORDER — VANCOMYCIN HCL IN DEXTROSE 750-5 MG/150ML-% IV SOLN
750.0000 mg | INTRAVENOUS | Status: DC
  Administered 2015-12-07: 750 mg via INTRAVENOUS
  Filled 2015-12-06 (×2): qty 150

## 2015-12-06 MED ORDER — TIROFIBAN HCL IN NACL 5-0.9 MG/100ML-% IV SOLN
0.0750 ug/kg/min | INTRAVENOUS | Status: AC
Start: 2015-12-06 — End: 2015-12-07
  Administered 2015-12-06 – 2015-12-07 (×2): 0.075 ug/kg/min via INTRAVENOUS
  Filled 2015-12-06 (×2): qty 100

## 2015-12-06 MED ORDER — MIDAZOLAM HCL 2 MG/2ML IJ SOLN
1.0000 mg | INTRAMUSCULAR | Status: DC | PRN
  Administered 2015-12-07: 1 mg via INTRAVENOUS
  Administered 2015-12-09: 10:00:00 via INTRAVENOUS
  Administered 2015-12-10 – 2015-12-11 (×2): 1 mg via INTRAVENOUS
  Filled 2015-12-06 (×5): qty 2

## 2015-12-06 MED ORDER — FENTANYL CITRATE (PF) 100 MCG/2ML IJ SOLN
INTRAMUSCULAR | Status: AC
  Filled 2015-12-06: qty 4

## 2015-12-06 MED ORDER — HEPARIN SODIUM (PORCINE) 1000 UNIT/ML IJ SOLN
INTRAMUSCULAR | Status: DC | PRN
  Administered 2015-12-06: 2500 [IU] via INTRAVENOUS

## 2015-12-06 MED ORDER — IOHEXOL 300 MG/ML  SOLN
INTRAMUSCULAR | Status: DC | PRN
  Administered 2015-12-06: 65 mL via INTRA_ARTERIAL

## 2015-12-06 MED ORDER — PIPERACILLIN-TAZOBACTAM 3.375 G IVPB
3.3750 g | Freq: Three times a day (TID) | INTRAVENOUS | Status: AC
  Administered 2015-12-06 – 2015-12-15 (×27): 3.375 g via INTRAVENOUS
  Filled 2015-12-06 (×29): qty 50

## 2015-12-06 MED ORDER — STERILE WATER FOR INJECTION IV SOLN
INTRAVENOUS | Status: DC
  Administered 2015-12-06 – 2015-12-07 (×2): via INTRAVENOUS
  Filled 2015-12-06 (×4): qty 850

## 2015-12-06 MED ORDER — INSULIN REGULAR HUMAN 100 UNIT/ML IJ SOLN
8.0000 [IU] | Freq: Once | INTRAMUSCULAR | Status: DC
  Filled 2015-12-06: qty 0.08

## 2015-12-06 MED ORDER — DOPAMINE-DEXTROSE 3.2-5 MG/ML-% IV SOLN
0.0000 ug/kg/min | INTRAVENOUS | Status: DC
  Administered 2015-12-06: 5 ug/kg/min via INTRAVENOUS

## 2015-12-06 MED ORDER — ALBUTEROL SULFATE (2.5 MG/3ML) 0.083% IN NEBU: 2.5000 mg | INHALATION_SOLUTION | RESPIRATORY_TRACT | Status: DC | PRN

## 2015-12-06 MED ORDER — FENTANYL CITRATE (PF) 100 MCG/2ML IJ SOLN
INTRAMUSCULAR | Status: DC | PRN
  Administered 2015-12-06: 25 ug via INTRAVENOUS
  Administered 2015-12-06: 50 ug via INTRAVENOUS
  Administered 2015-12-06 (×2): 25 ug via INTRAVENOUS

## 2015-12-06 MED ORDER — MIDAZOLAM HCL 5 MG/5ML IJ SOLN
INTRAMUSCULAR | Status: AC
  Filled 2015-12-06: qty 10

## 2015-12-06 MED ORDER — MIDAZOLAM HCL 2 MG/2ML IJ SOLN
1.0000 mg | INTRAMUSCULAR | Status: AC | PRN
  Administered 2015-12-07 (×3): 1 mg via INTRAVENOUS
  Filled 2015-12-06 (×3): qty 2

## 2015-12-06 MED ORDER — VECURONIUM BROMIDE 10 MG IV SOLR
INTRAVENOUS | Status: AC
  Filled 2015-12-06: qty 10

## 2015-12-06 SURGICAL SUPPLY — 18 items
BALLN ULTRVRSE 2.5X300X150 (BALLOONS) ×3
BALLN ULTRVRSE 3.5X100X150 (BALLOONS) ×3
BALLN ULTRVRSE 5X220X150 (BALLOONS) ×3
BALLN ULTRVRSE 6X300X130 (BALLOONS) ×3
BALLOON ULTRVRSE 2.5X300X150 (BALLOONS) ×1 IMPLANT
BALLOON ULTRVRSE 3.5X100X150 (BALLOONS) ×1 IMPLANT
BALLOON ULTRVRSE 5X220X150 (BALLOONS) ×1 IMPLANT
BALLOON ULTRVRSE 6X300X130 (BALLOONS) ×1 IMPLANT
CATH VERT 100CM (CATHETERS) ×3 IMPLANT
DEVICE PRESTO INFLATION (MISCELLANEOUS) ×3 IMPLANT
DEVICE SOLENT OMNI 120CM (CATHETERS) ×3 IMPLANT
DEVICE STARCLOSE SE CLOSURE (Vascular Products) ×3 IMPLANT
GLIDEWIRE ADV .035X260CM (WIRE) ×3 IMPLANT
PACK ANGIOGRAPHY (CUSTOM PROCEDURE TRAY) ×3 IMPLANT
STENT VIABAHN 5X50X120 (Permanent Stent) ×3 IMPLANT
STENT VIABAHN 6X250X120 (Permanent Stent) ×3 IMPLANT
TOWEL OR 17X26 4PK STRL BLUE (TOWEL DISPOSABLE) ×3 IMPLANT
WIRE G V18X300CM (WIRE) ×3 IMPLANT

## 2015-12-06 NOTE — Op Note (Signed)
Stephen Camacho Percutaneous Study/Intervention Procedural Note   Date of Surgery:  12/06/2015  Surgeon(s):Stephen Camacho   Assistants:none  Pre-operative Diagnosis: PAD with rest pain left lower extremity with occlusion of the bypass graft and previous initiation of thrombolysis.  Post-operative diagnosis: Same  Procedure(s) Performed: 1. Angiogram through existing catheter left lower extremity 2. Catheter placement into left peroneal artery from right femoral approach 3. Mechanical rheolytic thrombectomy of left peroneal artery, tibioperoneal trunk, popliteal artery, and femoral to popliteal bypass graft using the AngioJet on the catheter for proximally 270 cc of effluent returned 4. Percutaneous transluminal angioplasty of left peroneal artery with 2.5 mm diameter angioplasty balloon  5. Percutaneous transluminal angioplasty of distal bypass anastomosis and popliteal artery with 5 mm diameter angioplasty balloon as well as percutaneous transluminal angioplasty of graft and proximal bypass anastomosis with 6 mm diameter angioplasty balloon  6.  Viabahn covered stent placement to the peroneal artery and tibioperoneal trunk for high-grade residual stenosis and thrombosis after angioplasty and thrombectomy with 5 mm diameter by 5 cm length stent  7.  Viabahn covered stent placement to the mid to distal bypass and distal bypass anastomosis into the popliteal artery with 6 mm diameter by 25 cm length Viabahn covered stent 8. StarClose closure device right femoral artery  EBL: 25 cc  Contrast: 65 cc  Fluro Time: 10.9 minutes  Moderate Conscious Sedation Time: approximately 60 minutes using 4.5 mg of Versed and 125 mcg of Fentanyl  Indications: Patient is a 70 y.o.male with an ischemic left lower extremity. He has already had an amputation on the right leg. He was found to have  occlusion of his bypass graft on the left and was started on thrombolysis yesterday. The patient is brought in for angiography for further evaluation and potential treatment. Risks and benefits are discussed and informed consent is obtained  Procedure: The patient was identified and appropriate procedural time out was performed. The patient was then placed supine on the table and prepped and draped in the usual sterile fashion.Moderate conscious sedation was administered during a face to face encounter with the patient throughout the procedure with my supervision of the RN administering medicines and monitoring the patient's vital signs, pulse oximetry, telemetry and mental status throughout from the start of the procedure until the patient was taken to the recovery room. The existing lysis catheter was removed and a 0.035 advantage wire was then placed into the distal bypass. Selective left lower extremity angiogram was then performed. This demonstrated no flow within the bypass graft and no tibial reconstitution distally initially. I then used a Kumpe catheter and the advantage wire to cross through the bypass and the distal anastomosis and imaging was performed in the popliteal artery. Again, minimal distal flow was identified but a small peroneal artery was seen reconstituting distally that was not seen on more proximal injections. With the advantage wire and the Kumpe catheter I was able to gain access into the peroneal artery and confirm intraluminal flow with a Kumpe catheter. The wire was replaced. The AngioJet on the catheter was used to perform mechanical rheolytic thrombectomy from the common femoral artery through the proximal bypass anastomosis, the entirety of the bypass graft, the distal bypass anastomosis into the popliteal artery, and then advanced into the tibioperoneal trunk and the first 8-10 cm of the peroneal artery. Mechanical rheolytic thrombectomy was performed for about 270 cc of  effluent returned throughout these vessels. I then exchanged for a 0.018 wire in the peroneal artery.  Imaging showed residual thrombosis although more peroneal flow was seen distally. I then took a 2.5 mm diameter by 67 m length angioplasty balloon and inflated this from the distal peroneal artery up to the tibioperoneal trunk and into the popliteal artery. This was taken to 16 atm for 1 minute. The popliteal artery and distal bypass anastomosis were then treated with a 5 mm diameter by 22 cm length angioplasty balloon inflated to 12 atm for 1 minute. The graft from the distal graft up through the proximal bypass anastomosis into the common femoral artery were treated with 2 inflations of a 6 mm diameter by 30 cm length angioplasty balloon. The inflations were to 10-12 atm. Despite this, flow was very sluggish distally. With a catheter in the popliteal artery a plug of thrombus and near occlusive residual stenosis was seen in the proximal peroneal artery. I had already balloon angioplastied this area and performed thrombectomy with no improvement. I elected to place a 5 mm diameter by 5 cm length Viabahn covered stent in the distal tibioperoneal trunk and the first several centimeters of the peroneal artery encompassing this lesion. This was postdilated with a 3.5 mm diameter balloon. Following this, there still thrombus seen in the popliteal artery and greater than 50% residual stenosis at the distal bypass anastomosis with essentially no flow distally. A 6 mm diameter by 25 cm length stent was then deployed in the distal bypass graft, across the distal anastomosis into the popliteal artery down to the origin of the previously placed stent in the peroneal artery and tibioperoneal trunk. I then postdilated this stent with a 5 mm diameter by 22 cm length angioplasty balloon. At this point, flow still seemed to be sluggish but no significant residual stenosis or thrombosis was identified. Even with a catheter  placement in the tibioperoneal trunk, in-line flow was seen distally through the stents and into the peroneal artery downstream. No further intervention would be of benefit. I elected to terminate the procedure. The sheath was removed and StarClose closure device was deployed in the left femoral artery with excellent hemostatic result. The patient was taken to the recovery room in stable condition having tolerated the procedure well.  Findings:   Left Lower Extremity: Residual thrombosis of femoral to popliteal bypass graft. Residual thrombosis of all tibial vessels. Able to gain access to the peroneal artery and perform intervention.   Disposition: Patient was taken to the recovery room in stable condition having tolerated the procedure well.  Complications: None  Stephen Camacho 12/06/2015 10:34 AM

## 2015-12-06 NOTE — Progress Notes (Signed)
ANTICOAGULATION CONSULT NOTE - Initial Consult  Pharmacy Consult for apixaban  Indication: arterial thrombosis  No Known Allergies  Patient Measurements: Height: '6\' 4"'$  (193 cm) Weight: 143 lb 8 oz (65.091 kg) IBW/kg (Calculated) : 86.8   Vital Signs: Temp: 98.5 F (36.9 C) (02/11 0400) Temp Source: Oral (02/11 0400) BP: 107/77 mmHg (02/11 1230) Pulse Rate: 87 (02/11 1230)  Labs:  Recent Labs  12/05/15 0344 12/05/15 1631 12/05/15 1948 12/05/15 2053 12/06/15 0332 12/06/15 0832  HGB 9.9* 10.9* 10.2*  --  8.7*  --   HCT 31.2* 34.0* 32.8*  --  27.4*  --   PLT 196 195 164  --  156  --   APTT 41* 88*  --  132* 79* 72*  LABPROT 22.5*  --   --   --   --   --   INR 1.99  --   --   --   --   --   HEPARINUNFRC >3.60* 1.17*  --  0.70 0.46 0.36  CREATININE 0.83  --   --   --  1.45*  --     Estimated Creatinine Clearance: 43.6 mL/min (by C-G formula based on Cr of 1.45).   Medical History: Past Medical History  Diagnosis Date  . Hyperlipidemia   . Hypertension   . Diabetes mellitus ([place])   . CAD (coronary artery disease)     a. s/p 2 v CABG in 2012 (LIMA-LAD & SVG-OM); b. cath 07/2012 s/p PCI/DES to ostial LCx and OM  . PAD (peripheral artery disease) ([place])     a. s/p right SFA stent; b. s/p right toe amputation 2011; c. s/p right AKA spring 2016  . Seizures ([place])   . Gangrene of foot ([place])   . Wheezing   . Lung cancer ([place])   . Prostate cancer ([place])   . Stroke [facility])     Medications:  Scheduled:  . antiseptic oral rinse  7 mL Mouth Rinse QID  . [START ON 12/07/2015] apixaban  5 mg Per Tube BID  . chlorhexidine gluconate  15 mL Mouth Rinse BID  . diphenhydrAMINE      . fentaNYL      . fentaNYL      . insulin aspart  0-15 Units Subcutaneous Q4H  . insulin regular  8 Units Subcutaneous Once  . ipratropium-albuterol  3 mL Nebulization Q6H  . lactated ringers  1,000 mL Intravenous Once  . midazolam      . midazolam      . pantoprazole (PROTONIX) IV  40 mg  Intravenous Daily  . piperacillin-tazobactam (ZOSYN)  IV  3.375 g Intravenous 3 times per day  . sodium chloride flush  3 mL Intravenous Q12H  . vecuronium       Infusions:  . DOPamine 5 mcg/kg/min (12/06/15 1527)  . lactated ringers    . nitroGLYCERIN    . norepinephrine (LEVOPHED) Adult infusion 5 mcg/min (12/06/15 1530)  .  sodium bicarbonate 150 mEq in sterile water 1000 mL infusion    . tirofiban 0.075 mcg/kg/min (12/06/15 1303)    Assessment: Pharmacy consulted to dose apixaban in a 70 yo male with arterial thrombosis.  Per notes, patient has received thrombectomy and TPA during procedure and is currently receiving tirofiban drip.    SCr: 1.45, Wt: 65 kg, Age <80   Goal of Therapy:  Monitor CBC and SCr per policy   Plan:  Will start patient on apixaban 5 mg po BID as patient has received thrombectomy and  TPA.  CBC ordered in AM along with BMP.    Pharmacy will continue to follow.   Stephen Camacho G 12/06/2015,3:37 PM

## 2015-12-06 NOTE — Progress Notes (Signed)
Spoke to Dr. Lucky Cowboy via telephone concerning Fibriniogen, Hgb, and CBG levels. Received verbal telephone orders to pause the tPa and Heparin drips for 1 hours and then to be suspended at 0845; to type and crossmatch for potential transfusion (2 units to be prepared); repeat a CBC at 1100; and to have sliding scale insulin given. Called and spoke with hospitalist about insulin coverage. Dr. Marcille Blanco came to bedside.

## 2015-12-06 NOTE — Progress Notes (Signed)
Clarified order for SSI insulin.  Patient to have 8 units since NPO and going to surgery.

## 2015-12-06 NOTE — Progress Notes (Signed)
[place] Progress Note Patient Name: Stephen Camacho DOB: [birth year] MRN: [id number]   Date of Service  12/06/2015  HPI/Events of Note  Multiple issues: 1. Hgb = 4.8 and 2. Troponin = 2.41 and 3. ABG = 7.52/26/168 on a rate of 20. Already on Elaquis, Aggrastat and Metoprolol.  eICU Interventions  Will order: 1. Transfuse 3 units PRBCs. 2. Decrease PRVC rate to 13. 3. ABG at 11 PM.      Intervention Category Major Interventions: Other: Intermediate Interventions: Diagnostic test evaluation  [name],[name] 12/06/2015, 9:10 PM

## 2015-12-06 NOTE — Progress Notes (Signed)
MEDICATION RELATED CONSULT NOTE - INITIAL   Pharmacy Consult for tirofiban (Aggrastat) Indication: peripheral intervention  No Known Allergies  Patient Measurements: Height: '6\' 4"'$  (193 cm) Weight: 143 lb 8 oz (65.091 kg) IBW/kg (Calculated) : 86.8  Vital Signs: Temp: 98.5 F (36.9 C) (02/11 0400) Temp Source: Oral (02/11 0400) BP: 138/86 mmHg (02/11 1038) Pulse Rate: 105 (02/11 1038) Intake/Output from previous day: 02/10 0701 - 02/11 0700 In: 1847 [I.V.:1797; IV Piggyback:50] Out: 2500 [Urine:2500] Intake/Output from this shift:    Labs:  Recent Labs  12/05/15 0344 12/05/15 1631 12/05/15 1948 12/05/15 2053 12/06/15 0332 12/06/15 0832  WBC 7.9 16.6* 15.0*  --  10.0  --   HGB 9.9* 10.9* 10.2*  --  8.7*  --   HCT 31.2* 34.0* 32.8*  --  27.4*  --   PLT 196 195 164  --  156  --   APTT 41* 88*  --  132* 79* 72*  CREATININE 0.83  --   --   --  1.45*  --   ALBUMIN 3.9  --   --   --   --   --   PROT 8.0  --   --   --   --   --   AST 17  --   --   --   --   --   ALT 11*  --   --   --   --   --   ALKPHOS 83  --   --   --   --   --   BILITOT 0.4  --   --   --   --   --    Estimated Creatinine Clearance: 43.6 mL/min (by C-G formula based on Cr of 1.45).   Microbiology: No results found for this or any previous visit (from the past 720 hour(s)).  Medical History: Past Medical History  Diagnosis Date  . Hyperlipidemia   . Hypertension   . Diabetes mellitus ([place])   . CAD (coronary artery disease)     a. s/p 2 v CABG in 2012 (LIMA-LAD & SVG-OM); b. cath 07/2012 s/p PCI/DES to ostial LCx and OM  . PAD (peripheral artery disease) ([place])     a. s/p right SFA stent; b. s/p right toe amputation 2011; c. s/p right AKA spring 2016  . Seizures ([place])   . Gangrene of foot ([place])   . Wheezing   . Lung cancer ([place])   . Prostate cancer ([place])   . Stroke [facility])     Medications:  Scheduled:  . [START ON 12/07/2015] apixaban  5 mg Oral BID  . diphenhydrAMINE      . fentaNYL       . [MAR Hold] insulin aspart  0-15 Units Subcutaneous Q4H  . insulin regular  8 Units Subcutaneous Once  . midazolam      . [MAR Hold] sodium chloride flush  3 mL Intravenous Q12H   Infusions:  . sodium chloride 75 mL/hr at 12/06/15 1126  . [MAR Hold] nitroGLYCERIN    . tirofiban      Assessment: Pharmacy consulted to dose tirofiban for peripheral intervention.  Patient with orders for tirofiban 0.15 mcg/kg/min.  Est CrCl~43.6 mL/min  Plts: 156  Plan:  Based on CrCl<60 mL/min, will renally adjust tirofiban drip rate to 0.075 mcg/kg/min per manufacturer recommendations. Drip entered by MD to continue for total of 18 hours.   Daylah Sayavong G 12/06/2015,11:43 AM

## 2015-12-06 NOTE — Progress Notes (Signed)
[place] Progress Note Patient Name: Stephen Camacho DOB: March 28, 1946 MRN: [id number]   Date of Service  12/06/2015  HPI/Events of Note  ABG on 40%/PRVC 20/TV 500/P 5 = 7.15/37/134 Patient is already on a NaHCO3 IV infusion   eICU Interventions  Will order: 1. Increase PRVC rate to 30. 2. ABG at 7:30 PM.      Intervention Category Major Interventions: Respiratory failure - evaluation and management  Brindle Leyba Eugene 12/06/2015, 6:25 PM

## 2015-12-06 NOTE — Consult Note (Signed)
PULMONARY / CRITICAL CARE MEDICINE   Name: Stephen Camacho MRN: [id number] DOB: [birth year]    ADMISSION DATE:  12/05/2015   CONSULTATION DATE: 12/06/2015   REFERRING MD:  Delana Meyer  CHIEF COMPLAINT: Acute respiratory failure, left fem-pop bypass graft occlusion and SOB  HISTORY OF PRESENT ILLNESS:   This is a 70 yo AA male with a PMH of lung cancer s/p right lung lobectomy, CAD s/p CABG, PVD s/p right AKA/left femoral-popliteal bypass in 2013, type 2 DM and CVA admitted with an acute occlusive thrombus of the femoral-popliteal bypass. Patient initially presented on 02/07 with left calf pain and sob. His dopplers, CXR and labs were unremarkable hence patient was discharged. On 02/09, patient presented with persistent calf pain and cold left foot. A CT angio revealed a complete occlusion of the left fem-po bypass graft. He was taken to the OR this morning for an angiogram with thrombolysis, thrombectomy of left peroneal artery, tibioperoneal trunk, popliteal artery, and femoral to popliteal bypass graft and percutaneous transluminal angioplasty of the left peroneal artery and distal bypass anastomosis and popliteal artery. Post-op, patient was hypoxic, tachypneic and minimally responsive. He was emergently intubated for airway protection. Based on OR report, there is residual thrombosis of femoral to popliteal bypass graft and all tibial vessels Post-intubation, patient became bradycardic with HR of 39 and hypotensive with systolic BP in the 62X. One dose of atropine 0.'5mg'$  given with improvement in HR. He is now in [place] with a rate of 102-106. His blood pressure has improved with fluid boluses and pressors. His current MAP is 74. He is hypothermic with a temp of 35.6.   PAST MEDICAL HISTORY :  He  has a past medical history of Hyperlipidemia; Hypertension; Diabetes mellitus ([place]); CAD (coronary artery disease); PAD (peripheral artery disease) ([place]); Seizures ([place]); Gangrene of foot ([place]); Wheezing; Lung  cancer ([place]); Prostate cancer [facility]); and Stroke ([place]).  PAST SURGICAL HISTORY: He  has past surgical history that includes right aka (07-10-2014); left femoral popliteal bypass; right lung lobeectomy; and Coronary artery bypass graft.  No Known Allergies  No current facility-administered medications on file prior to encounter.   Current Outpatient Prescriptions on File Prior to Encounter  Medication Sig  . acetaminophen (TYLENOL) 650 MG CR tablet Take 650 mg by mouth every 8 (eight) hours as needed for pain.  [place] Kitchen aspirin 81 MG chewable tablet Chew 81 mg by mouth daily.  Marland Kitchen atorvastatin (LIPITOR) 40 MG tablet Take 40 mg by mouth daily at 6 PM.  . cilostazol (PLETAL) 50 MG tablet Take 50 mg by mouth 2 (two) times daily.  . insulin glargine (LANTUS) 100 UNIT/ML injection Inject 10 Units into the skin at bedtime.  . isosorbide mononitrate (IMDUR) 60 MG 24 hr tablet Take 60 mg by mouth daily.  Marland Kitchen lisinopril (PRINIVIL,ZESTRIL) 10 MG tablet Take 10 mg by mouth daily.  . meclizine (ANTIVERT) 25 MG tablet Take 1 tablet (25 mg total) by mouth 3 (three) times daily as needed for dizziness.  . metFORMIN (GLUCOPHAGE) 500 MG tablet Take by mouth 2 (two) times daily with a meal.  . metoprolol (LOPRESSOR) 50 MG tablet Take 50 mg by mouth 2 (two) times daily.  . nitroGLYCERIN (NITROSTAT) 0.4 MG SL tablet Place 0.4 mg under the tongue every 5 (five) minutes as needed for chest pain.  Marland Kitchen ondansetron (ZOFRAN) 4 MG tablet Take 1 tablet (4 mg total) by mouth every 8 (eight) hours as needed for nausea or vomiting.  . rivaroxaban (XARELTO) 20 MG TABS  tablet Take 20 mg by mouth daily with supper.  . traMADol (ULTRAM) 50 MG tablet Take 25-50 mg by mouth every 6 (six) hours as needed for severe pain.     FAMILY HISTORY:  His has no family status information on file.    SOCIAL HISTORY: He  reports that he has never smoked. He does not have any smokeless tobacco history on file. He reports that he does not drink  alcohol.  REVIEW OF SYSTEMS:   Unable to obtain, patient is intubated and sedated  SUBJECTIVE:    VITAL SIGNS: BP 105/70 mmHg  Pulse 104  Temp(Src) 97.2 F (36.2 C) (Oral)  Resp 20  Ht '6\' 4"'$  (1.93 m)  Wt 143 lb 8 oz (65.091 kg)  BMI 17.47 kg/m2  SpO2 100%  HEMODYNAMICS: CVP:  [0 mmHg-51 mmHg] 44 mmHg  VENTILATOR SETTINGS: Vent Mode:  [-] PRVC FiO2 (%):  [40 %-60 %] 40 % Set Rate:  [15 bmp-20 bmp] 20 bmp Vt Set:  [500 mL] 500 mL PEEP:  [5 cmH20] 5 cmH20 Plateau Pressure:  [15 cmH20] 15 cmH20  INTAKE / OUTPUT: I/O last 3 completed shifts: In: 1847 [I.V.:1797; IV Piggyback:50] Out: 2500 [Urine:2500]  PHYSICAL EXAMINATION: General: Frail looking male, in acute respiratory distress Neuro: Groans to noxious stimulus, unable to follow commands  HEENT: PERRLA, Oral mucosa with moderate amount of thick secretions  Cardiovascular: ST, s1/s2, no MRG Lungs:  Labored, bilateral airflow, scattered rhonchi in all lung fields Abdomen: Soft, NT/ND, +BS X4 Musculoskeletal:  Right AKA, left leg with well healed bypass scar Extremities: left leg cold; no palpable pulses, +femoral pulse Skin: Cold; mild discoloration in left leg  LABS:  BMET  Recent Labs Lab 12/02/15 1032 12/05/15 0344 12/06/15 0332  NA 136 137 137  K 4.1 4.1 4.5  CL 106 107 104  CO2 '23 24 23  '$ BUN '13 15 18  '$ CREATININE 0.88 0.83 1.45*  GLUCOSE 153* 133* 447*    Electrolytes  Recent Labs Lab 12/02/15 1032 12/05/15 0344 12/06/15 0332  CALCIUM 9.1 9.1 8.3*    CBC  Recent Labs Lab 12/05/15 1631 12/05/15 1948 12/06/15 0332  WBC 16.6* 15.0* 10.0  HGB 10.9* 10.2* 8.7*  HCT 34.0* 32.8* 27.4*  PLT 195 164 156    Coag's  Recent Labs Lab 12/05/15 0344  12/05/15 2053 12/06/15 0332 12/06/15 0832  APTT 41*  < > 132* 79* 72*  INR 1.99  --   --   --   --   < > = values in this interval not displayed.  Sepsis Markers  Recent Labs Lab 12/06/15 1508  LATICACIDVEN 17.8*     ABG  Recent Labs Lab 12/06/15 1420 12/06/15 1607  PHART 6.94* 7.15*  PCO2ART 31* 37  PO2ART 338* 134*    Liver Enzymes  Recent Labs Lab 12/05/15 0344  AST 17  ALT 11*  ALKPHOS 83  BILITOT 0.4  ALBUMIN 3.9    Cardiac Enzymes  Recent Labs Lab 12/02/15 1032 12/02/15 1339  TROPONINI 0.04* <0.03    Glucose  Recent Labs Lab 12/05/15 1603 12/06/15 0557 12/06/15 1358  GLUCAP 146* 387* 383*    Imaging Dg Chest Port 1 View  12/06/2015  CLINICAL DATA:  Hypoxia EXAM: PORTABLE CHEST 1 VIEW COMPARISON:  December 02, 2015 FINDINGS: Endotracheal tube tip is 4.8 cm above the carina. Nasogastric tube tip is in the stomach with the side port at the gastroesophageal junction. Central catheter tip is in the superior vena cava. No pneumothorax. There  is scarring in the right and left lung bases. There is postoperative change in the medial right upper lobe region. Lungs elsewhere clear. Heart size and pulmonary vascularity are normal. No adenopathy. No bone lesions. Patient is status post coronary artery bypass grafting. IMPRESSION: Tube and catheter positions as described without pneumothorax. Note that the nasogastric tube side port is at the gastroesophageal junction. Advise advancing nasogastric tube 4-5 cm. There is bibasilar lung scarring with postoperative change in the right upper lobe. No edema or consolidation. No change in cardiac silhouette. These results will be called to the ordering clinician or representative by the Radiologist Assistant, and communication documented in the PACS or zVision Dashboard. Electronically Signed   By: Lowella Grip III M.D.   On: 12/06/2015 13:51   Dg Abd Portable 1v  12/06/2015  CLINICAL DATA:  Intubation, OG tube placement. History of coronary artery disease, lung cancer, prostate cancer, stroke and diabetes EXAM: PORTABLE ABDOMEN - 1 VIEW COMPARISON:  None. FINDINGS: The OG tube is only partially seen at the upper aspects of this exam, of  uncertain position, presumably in the stomach. Bowel gas pattern is nonobstructive. Moderate amount of stool throughout the grossly nondistended colon. Hyperdense material within the right upper quadrant is of uncertain etiology or significance, possibly contrast within the gallbladder. No evidence of free intraperitoneal air seen. Osseous structures are unremarkable. IMPRESSION: 1. OG tube incompletely visualized at the upper aspects of this study. On a chest x-ray performed earlier same day, tip is seen in the stomach but with proximal side holes at the level of the gastroesophageal junction. Recommend advancing for more optimal radiographic positioning. 2. Nonobstructive bowel gas pattern. Fairly large amount of stool within the colon (constipation? ). 3. Hyperdense material within the right upper quadrant, of uncertain etiology or significance, most likely vicarious excretion of contrast within the gallbladder related to patient's recent CT angiogram runoff. Electronically Signed   By: Franki Cabot M.D.   On: 12/06/2015 14:00    STUDIES:  02/11: STAT EKG>>  CULTURES: 02/11 Blood x2>> Urine>> Respiratory>>  ANTIBIOTICS: Piperacillin-tazobactam 02/11> Vancomycin 02/11>  SIGNIFICANT EVENTS: 02/07>ED with SOB and left calf pain>treated and released 02/09>ED with cold left foot and calf pain>CT angio>complete occlusion of the left fem-pop bypass graft>admitted 02/11>OR for angiogram with thrombolysis, thrombectomy of left peroneal artery, tibioperoneal trunk, popliteal artery, and femoral to popliteal bypass graft and percutaneous transluminal angioplasty of the left peroneal artery and distal bypass anastomosis and popliteal artery Post-op: residual thrombosis of femoral to popliteal bypass graft and all tibial vessels; acute respiratory failure>Intubated   LINES/TUBES: Right IJ tlc 02/10> ETT 02/11>  DISCUSSION: 70 YO male with acute complete occlusion of the  left fem-pop bypass graft  s/p thrombolysis/thrombectomy/percuteneous transluminal angioplasty with residual thrombosis, sepsis secondary to possible necrosis from left leg thrombosis, and  acute hypoxic respiratory failure secondary to sepsis  ASSESSMENT / PLAN:  PULMONARY A: Acute hypoxic respiratory failure Possible aspiration pneumonia-Copious amount of secretions noted in airway during intubation Severe metabolic acidosis H/O Lung cancer s/p right lung lobectomy P:   -Full vent support-Settings: PRVC 60%/5/20/500 -ABG post-intubation and 1600 repeat reviewed -F/U 8PM ABG -VAP prevention protocol -CXR post-intubation reviewed-ETT/OGT in optimal positions -F/U CXR in am -Albuterol and Duoneb prn -Abx as above -F/U cultures -Bicarb 2 amps iv x 1 and the 3 amps in water and infuse at 19m/hr   CARDIOVASCULAR A:  Bradycardia-Resolved; Now in ST Septic shock H/O hypertension P:  -IV fluid boluses until CVP GE12 -Levophed gtte  and titrate to MAP goal >65 STAT EKG -Hemodynamics per ICU protocol -Hold home BP meds   RENAL A:   AKI secondary procedural IV dye and volume depletion; baseline creatinine 0.83 P:   -IV fluids -Trend creatinine -Renally dose meds  GASTROINTESTINAL A:   Constipation P:   -PPI for GI prophylaxis -Dulcolax suppo x 1 tonight -Monitor BMs  HEMATOLOGIC A:   Left leg ischemia-Complete Occlusion of the left fem-pop bypass graft s/p thrombolysis/PTA with residual thrombosis Right neck hematoma s/p TLC placement Severe PVD s/p Right AKA P:  -Vascular surgery following -Anticoagulation with eliquis and tirofiban per vascular -Monitor left limb for re-thrombosis - CT neck without contrast to r/o soft tissue swelling and tracheal compression -Nitroglycerin gtt per vascular -Monitor for bleeding  INFECTIOUS A:   Septic shock-likely source is left leg ischemia/necrosis from massive thrombus P:   -Antibiotics as above -F/U cultures -IV fluids and pressure to keep  MAP>65 -F/U lactic acid level and procalcitonin  ENDOCRINE A:   Type 2 diabetes mellitus   P:   -Blood glucose monitoring with SSI coverage -Hold oral hypoglycemic agents  NEUROLOGIC A:   Acute metabolic encephalopathy H/o CVA H/O Seizures-Not on home seizure meds P:   RASS goal: -2 -Fentanyl and versed  Prn for vent sedation -Monitor for seizure activity   FAMILY  - Updates: Patient's son, daughter and daughter in law updated at bedside prior to and after intubation. Son updated again after patient had an episode of bradycardia. All questions answered and support provided  - Inter-disciplinary family meet or Palliative Care meeting due by:  02/17  Best Practice: Code Status:  Full Diet: NPO GI prophylaxis:  PPI. VTE prophylaxis:  SCD's /Already on anticoagulation for Left leg ischemic  Critical care time spent examining patient, establishing treatment plan, managing vent, reviewing history and labs, CXR, and ABG interpretation is 96 minutes  Kaleigh Spiegelman S. [facility] ANP-BC Pulmonary and [place] Pager [phone]  12/06/2015, 5:36 PM

## 2015-12-06 NOTE — Progress Notes (Signed)
Pt breathing labored, EtCo2 level 13.  Dr. Mortimer Fries called to see patient.  Will intubate pt. After talking with family.

## 2015-12-06 NOTE — Consult Note (Signed)
[facility] Consultation      Name: Stephen Camacho MRN: [id number] DOB: 02/28/1946    ADMISSION DATE:  12/05/2015   CHIEF COMPLAINT:    acute resp failure   HISTORY OF PRESENT ILLNESS  Patient is 70 yo AAM thin and frail, malnourished with acute resp failure, ABG shows severe metabolic acidosis, patient was admitted to ICU initially for s/p vasc procedure for left ischemic leg. Patient supposedly had left leg pain. Patient was lethargic and unresponsinve Patient was emergently intubated,sedated placed on full vent support  Oral airway had extensive amounts of oral secretions upon intubation, evidence of aspiration    SIGNIFICANT EVENTS  Inubated 2/11    PAST MEDICAL HISTORY    :  Past Medical History  Diagnosis Date  . Hyperlipidemia   . Hypertension   . Diabetes mellitus (HCC)   . CAD (coronary artery disease)     a. s/p 2 v CABG in 2012 (LIMA-LAD & SVG-OM); b. cath 07/2012 s/p PCI/DES to ostial LCx and OM  . PAD (peripheral artery disease) (HCC)     a. s/p right SFA stent; b. s/p right toe amputation 2011; c. s/p right AKA spring 2016  . Seizures (HCC)   . Gangrene of foot (HCC)   . Wheezing   . Lung cancer (HCC)   . Prostate cancer (HCC)   . Stroke [facility])    Past Surgical History  Procedure Laterality Date  . Right aka  07-10-2014  . Left femoral popliteal bypass    . Right lung lobeectomy    . Coronary artery bypass graft     Prior to Admission medications   Medication Sig Start Date End Date Taking? Authorizing Provider  acetaminophen (TYLENOL) 650 MG CR tablet Take 650 mg by mouth every 8 (eight) hours as needed for pain.    Historical Provider, MD  aspirin 81 MG chewable tablet Chew 81 mg by mouth daily.    Historical Provider, MD  atorvastatin (LIPITOR) 40 MG tablet Take 40 mg by mouth daily at 6 PM.    Historical Provider, MD  cilostazol (PLETAL) 50 MG tablet Take 50 mg by mouth 2 (two) times daily.    Historical Provider, MD    insulin glargine (LANTUS) 100 UNIT/ML injection Inject 10 Units into the skin at bedtime.    Historical Provider, MD  isosorbide mononitrate (IMDUR) 60 MG 24 hr tablet Take 60 mg by mouth daily.    Historical Provider, MD  lisinopril (PRINIVIL,ZESTRIL) 10 MG tablet Take 10 mg by mouth daily.    Historical Provider, MD  meclizine (ANTIVERT) 25 MG tablet Take 1 tablet (25 mg total) by mouth 3 (three) times daily as needed for dizziness. 10/27/15   Jennye Moccasin, MD  metFORMIN (GLUCOPHAGE) 500 MG tablet Take by mouth 2 (two) times daily with a meal.    Historical Provider, MD  metoprolol (LOPRESSOR) 50 MG tablet Take 50 mg by mouth 2 (two) times daily.    Historical Provider, MD  nitroGLYCERIN (NITROSTAT) 0.4 MG SL tablet Place 0.4 mg under the tongue every 5 (five) minutes as needed for chest pain.    Historical Provider, MD  ondansetron (ZOFRAN) 4 MG tablet Take 1 tablet (4 mg total) by mouth every 8 (eight) hours as needed for nausea or vomiting. 10/27/15   Jennye Moccasin, MD  rivaroxaban (XARELTO) 20 MG TABS tablet Take 20 mg by mouth daily with supper.    Historical Provider, MD  traMADol (ULTRAM) 50 MG tablet Take 25-50  mg by mouth every 6 (six) hours as needed for severe pain.     Historical Provider, MD   No Known Allergies   FAMILY HISTORY   Family History  Problem Relation Age of Onset  . Family history unknown: Yes      SOCIAL HISTORY    reports that he has never smoked. He does not have any smokeless tobacco history on file. He reports that he does not drink alcohol. His drug history is not on file.  Review of Systems  Unable to perform ROS: critical illness      VITAL SIGNS    Temp:  [96.1 F (35.6 C)-98.6 F (37 C)] 97.2 F (36.2 C) (02/11 1640) Pulse Rate:  [84-109] 104 (02/11 1640) Resp:  [10-32] 20 (02/11 1640) BP: (68-159)/(37-89) 105/70 mmHg (02/11 1630) SpO2:  [97 %-100 %] 100 % (02/11 1640) FiO2 (%):  [40 %-60 %] 40 % (02/11 1528) HEMODYNAMICS: CVP:   [0 mmHg-51 mmHg] 44 mmHg VENTILATOR SETTINGS: Vent Mode:  [-] PRVC FiO2 (%):  [40 %-60 %] 40 % Set Rate:  [15 bmp-20 bmp] 20 bmp Vt Set:  [500 mL] 500 mL PEEP:  [5 cmH20] 5 cmH20 Plateau Pressure:  [15 cmH20] 15 cmH20 INTAKE / OUTPUT:  Intake/Output Summary (Last 24 hours) at 12/06/15 1838 Last data filed at 12/06/15 0400  Gross per 24 hour  Intake 1797.72 ml  Output   1500 ml  Net 297.72 ml       PHYSICAL EXAM   Physical Exam  Constitutional: He appears distressed.  HENT:  Head: Normocephalic and atraumatic.  Eyes: Pupils are equal, round, and reactive to light. No scleral icterus.  Neck: Normal range of motion. Neck supple.  Cardiovascular: Normal rate and regular rhythm.   No murmur heard. Pulmonary/Chest: He is in respiratory distress. He has no wheezes. He has rales.  resp distress  Abdominal: Soft. He exhibits no distension. There is no tenderness.  Musculoskeletal: He exhibits no edema.  Neurological: He displays normal reflexes. Coordination normal.  gcs<8T  Skin: Skin is warm. No rash noted. He is diaphoretic.       LABS   LABS:  CBC  Recent Labs Lab 12/05/15 1631 12/05/15 1948 12/06/15 0332  WBC 16.6* 15.0* 10.0  HGB 10.9* 10.2* 8.7*  HCT 34.0* 32.8* 27.4*  PLT 195 164 156   Coag's  Recent Labs Lab 12/05/15 0344  12/05/15 2053 12/06/15 0332 12/06/15 0832  APTT 41*  < > 132* 79* 72*  INR 1.99  --   --   --   --   < > = values in this interval not displayed. BMET  Recent Labs Lab 12/02/15 1032 12/05/15 0344 12/06/15 0332  NA 136 137 137  K 4.1 4.1 4.5  CL 106 107 104  CO2 _0 BUN _1 CREATININE 0.88 0.83 1.45*  GLUCOSE 153* 133* 447*   Electrolytes  Recent Labs Lab 12/02/15 1032 12/05/15 0344 12/06/15 0332  CALCIUM 9.1 9.1 8.3*   Sepsis Markers  Recent Labs Lab 12/06/15 1508  LATICACIDVEN 17.8*   ABG  Recent Labs Lab 12/06/15 1420 12/06/15 1607  PHART 6.94* 7.15*  PCO2ART 31* 37  PO2ART  338* 134*   Liver Enzymes  Recent Labs Lab 12/05/15 0344  AST 17  ALT 11*  ALKPHOS 83  BILITOT 0.4  ALBUMIN 3.9   Cardiac Enzymes  Recent Labs Lab 12/02/15 1032 12/02/15 1339  TROPONINI 0.04* <0.03   Glucose  Recent Labs Lab  12/05/15 1603 12/06/15 0557 12/06/15 1358 12/06/15 1816  GLUCAP 146* 387* 383* 214*     No results found for this or any previous visit (from the past 240 hour(s)).   Current facility-administered medications:  .  acetaminophen (TYLENOL) tablet 650 mg, 650 mg, Per Tube, Q6H PRN **OR** [DISCONTINUED] acetaminophen (TYLENOL) suppository 650 mg, 650 mg, Rectal, Q6H PRN, [name], MD .  albuterol (PROVENTIL) (2.5 MG/3ML) 0.083% nebulizer solution 2.5 mg, 2.5 mg, Nebulization, Q4H PRN, Mikael Spray, NP .  antiseptic oral rinse solution (CORINZ), 7 mL, Mouth Rinse, QID, Mikael Spray, NP, 7 mL at 12/06/15 1602 .  [START ON 12/07/2015] apixaban (ELIQUIS) tablet 5 mg, 5 mg, Per Tube, BID, Mikael Spray, NP .  bisacodyl (DULCOLAX) suppository 10 mg, 10 mg, Rectal, Daily PRN, Mikael Spray, NP .  bisacodyl (DULCOLAX) suppository 10 mg, 10 mg, Rectal, Once, Magadalene S Tukov, NP .  chlorhexidine gluconate (PERIDEX) 0.12 % solution 15 mL, 15 mL, Mouth Rinse, BID, Magadalene S Tukov, NP, 15 mL at 12/06/15 1404 .  diphenhydrAMINE (BENADRYL) 50 MG/ML injection, , , ,  .  DOPamine (INTROPIN) 800 mg in dextrose 5 % 250 mL (3.2 mg/mL) infusion, 0-20 mcg/kg/min, Intravenous, Continuous, Mikael Spray, NP, Last Rate: 6.1 mL/hr at 12/06/15 1527, 5 mcg/kg/min at 12/06/15 1527 .  fentaNYL (SUBLIMAZE) 100 MCG/2ML injection, , , ,  .  fentaNYL (SUBLIMAZE) 100 MCG/2ML injection, , , ,  .  fentaNYL (SUBLIMAZE) injection 50 mcg, 50 mcg, Intravenous, Q15 min PRN, Mikael Spray, NP .  fentaNYL (SUBLIMAZE) injection 50 mcg, 50 mcg, Intravenous, Q2H PRN, Magadalene S Tukov, NP .  hydrALAZINE (APRESOLINE) injection 5 mg, 5 mg, Intravenous,  Q20 Min PRN, [name], MD .  insulin aspart (novoLOG) injection 0-15 Units, 0-15 Units, Subcutaneous, Q4H, Harrie Foreman, MD, 15 Units at 12/06/15 1551 .  insulin regular (NOVOLIN R,HUMULIN R) 100 units/mL injection 8 Units, 8 Units, Subcutaneous, Once, Harrie Foreman, MD .  ipratropium-albuterol (DUONEB) 0.5-2.5 (3) MG/3ML nebulizer solution 3 mL, 3 mL, Nebulization, Q6H, Magadalene S Tukov, NP, 3 mL at 12/06/15 1446 .  labetalol (NORMODYNE,TRANDATE) injection 10 mg, 10 mg, Intravenous, Q10 min PRN, [name], MD, 10 mg at 12/05/15 1837 .  lactated ringers infusion, , Intravenous, Continuous, Mikael Spray, NP .  metoprolol (LOPRESSOR) injection 2-5 mg, 2-5 mg, Intravenous, Q2H PRN, [name], MD .  midazolam (VERSED) 2 MG/2ML injection, , , ,  .  midazolam (VERSED) 5 MG/5ML injection, , , ,  .  midazolam (VERSED) injection 1 mg, 1 mg, Intravenous, Q15 min PRN, Mikael Spray, NP .  midazolam (VERSED) injection 1 mg, 1 mg, Intravenous, Q2H PRN, Mikael Spray, NP .  nitroGLYCERIN 50 mg in dextrose 5 % 250 mL (0.2 mg/mL) infusion, 5-250 mcg/min, Intravenous, Titrated, [name], MD .  norepinephrine (LEVOPHED) 4 mg in dextrose 5 % 250 mL (0.016 mg/mL) infusion, 2-50 mcg/min, Intravenous, Continuous, Magadalene S Tukov, NP, Last Rate: 18.8 mL/hr at 12/06/15 1530, 5 mcg/min at 12/06/15 1530 .  ondansetron (ZOFRAN) tablet 4 mg, 4 mg, Oral, Q6H PRN **OR** ondansetron (ZOFRAN) injection 4 mg, 4 mg, Intravenous, Q6H PRN, [name], MD .  pantoprazole (PROTONIX) injection 40 mg, 40 mg, Intravenous, Daily, Mikael Spray, NP, 40 mg at 12/06/15 1600 .  piperacillin-tazobactam (ZOSYN) IVPB 3.375 g, 3.375 g, Intravenous, 3 times per day, Pernell Dupre, RPH, 3.375 g at 12/06/15 1551 .  sennosides (SENOKOT) 8.8 MG/5ML syrup 5 mL, 5 mL, Per Tube, BID PRN, Mikael Spray, NP .  sodium bicarbonate 150 mEq in sterile water 1,000 mL infusion, ,  Intravenous, Continuous, Mikael Spray, NP, Last Rate: 100 mL/hr at 12/06/15 1543 .  sodium chloride flush (NS) 0.9 % injection 3 mL, 3 mL, Intravenous, Q12H, [name], MD, 3 mL at 12/05/15 2200 .  sodium chloride flush (NS) 0.9 % injection 3 mL, 3 mL, Intravenous, PRN, [name], MD .  tirofiban (AGGRASTAT) infusion 50 mcg/mL 100 mL, 0.075 mcg/kg/min, Intravenous, Continuous, Crystal G Scarpena, RPH, Last Rate: 5.9 mL/hr at 12/06/15 1303, 0.075 mcg/kg/min at 12/06/15 1303 .  vecuronium (NORCURON) 10 MG injection, , , ,   IMAGING    Dg Chest Port 1 View  12/06/2015  CLINICAL DATA:  Hypoxia EXAM: PORTABLE CHEST 1 VIEW COMPARISON:  December 02, 2015 FINDINGS: Endotracheal tube tip is 4.8 cm above the carina. Nasogastric tube tip is in the stomach with the side port at the gastroesophageal junction. Central catheter tip is in the superior vena cava. No pneumothorax. There is scarring in the right and left lung bases. There is postoperative change in the medial right upper lobe region. Lungs elsewhere clear. Heart size and pulmonary vascularity are normal. No adenopathy. No bone lesions. Patient is status post coronary artery bypass grafting. IMPRESSION: Tube and catheter positions as described without pneumothorax. Note that the nasogastric tube side port is at the gastroesophageal junction. Advise advancing nasogastric tube 4-5 cm. There is bibasilar lung scarring with postoperative change in the right upper lobe. No edema or consolidation. No change in cardiac silhouette. These results will be called to the ordering clinician or representative by the Radiologist Assistant, and communication documented in the PACS or zVision Dashboard. Electronically Signed   By: Lowella Grip III M.D.   On: 12/06/2015 13:51   Dg Abd Portable 1v  12/06/2015  CLINICAL DATA:  Intubation, OG tube placement. History of coronary artery disease, lung cancer, prostate cancer, stroke and diabetes EXAM:  PORTABLE ABDOMEN - 1 VIEW COMPARISON:  None. FINDINGS: The OG tube is only partially seen at the upper aspects of this exam, of uncertain position, presumably in the stomach. Bowel gas pattern is nonobstructive. Moderate amount of stool throughout the grossly nondistended colon. Hyperdense material within the right upper quadrant is of uncertain etiology or significance, possibly contrast within the gallbladder. No evidence of free intraperitoneal air seen. Osseous structures are unremarkable. IMPRESSION: 1. OG tube incompletely visualized at the upper aspects of this study. On a chest x-ray performed earlier same day, tip is seen in the stomach but with proximal side holes at the level of the gastroesophageal junction. Recommend advancing for more optimal radiographic positioning. 2. Nonobstructive bowel gas pattern. Fairly large amount of stool within the colon (constipation? ). 3. Hyperdense material within the right upper quadrant, of uncertain etiology or significance, most likely vicarious excretion of contrast within the gallbladder related to patient's recent CT angiogram runoff. Electronically Signed   By: Franki Cabot M.D.   On: 12/06/2015 14:00      Indwelling Urinary Catheter continued, requirement due to   Reason to continue Indwelling Urinary Catheter for strict Intake/Output monitoring for hemodynamic instability   Central Line continued, requirement due to   Reason to continue [employer] Monitoring of central venous pressure or other hemodynamic parameters   Ventilator continued, requirement due to, resp failure    Ventilator Sedation RASS 0 to -2  ANTIMICROBIALS:  zosyn   ASSESSMENT/PLAN   70 yo AAM with acute resp failure and severe resp failure from acute and severe met acidosis and encephalopathy with acute left leg ischemia with sepsis and aspiration pneumonia Will need MV support and Vasopressors and IVF's    I have personally obtained a history, examined the  patient, evaluated laboratory and independently reviewed  imaging results, formulated the assessment and plan and placed orders.  The Patient requires high complexity decision making for assessment and support, frequent evaluation and titration of therapies, application of advanced monitoring technologies and extensive interpretation of multiple databases. Critical Care Time devoted to patient care services described in this note is 96 minutes.   Overall, patient is critically ill, prognosis is guarded. Patient at high risk for cardiac arrest and death.    Corrin Parker, M.D.  [facility]  Medical Director [facility] Director [facility] Cardio-Pulmonary Department

## 2015-12-06 NOTE — Consult Note (Cosign Needed)
Reason for Consult: Hyperglycemia Referring Physician: Dr. Sinclair Ship is an 70 y.o. male.  HPI: The patient was admitted for an ischemic left lower extremity. He was found to have an occlusion of his femoral artery and is scheduled for a femoral-popliteal bypass. Prior to scheduled surgery the patient was found to have elevated blood sugars for which the hospitalist service was called to manage.  Past Medical History  Diagnosis Date  . Hyperlipidemia   . Hypertension   . Diabetes mellitus ([place])   . CAD (coronary artery disease)     a. s/p 2 v CABG in 2012 (LIMA-LAD & SVG-OM); b. cath 07/2012 s/p PCI/DES to ostial LCx and OM  . PAD (peripheral artery disease) ([place])     a. s/p right SFA stent; b. s/p right toe amputation 2011; c. s/p right AKA spring 2016  . Seizures ([place])   . Gangrene of foot ([place])   . Wheezing   . Lung cancer ([place])   . Prostate cancer ([place])   . Stroke [facility])     Past Surgical History  Procedure Laterality Date  . Right aka  07-10-2014  . Left femoral popliteal bypass    . Right lung lobeectomy    . Coronary artery bypass graft      Family History  Problem Relation Age of Onset  . Family history unknown: Yes    Social History:  reports that he has never smoked. He does not have any smokeless tobacco history on file. He reports that he does not drink alcohol. His drug history is not on file.  Allergies: No Known Allergies  Medications:  Prior to Admission:  Prescriptions prior to admission  Medication Sig Dispense Refill Last Dose  . acetaminophen (TYLENOL) 650 MG CR tablet Take 650 mg by mouth every 8 (eight) hours as needed for pain.   prn at prn  . aspirin 81 MG chewable tablet Chew 81 mg by mouth daily.   10/27/2015 at 1030  . atorvastatin (LIPITOR) 40 MG tablet Take 40 mg by mouth daily at 6 PM.   10/26/2015 at Unknown time  . cilostazol (PLETAL) 50 MG tablet Take 50 mg by mouth 2 (two) times daily.   10/27/2015 at 1030  . insulin glargine  (LANTUS) 100 UNIT/ML injection Inject 10 Units into the skin at bedtime.   10/26/2015 at Unknown time  . isosorbide mononitrate (IMDUR) 60 MG 24 hr tablet Take 60 mg by mouth daily.   10/27/2015 at 1030  . lisinopril (PRINIVIL,ZESTRIL) 10 MG tablet Take 10 mg by mouth daily.   10/27/2015 at 1030  . meclizine (ANTIVERT) 25 MG tablet Take 1 tablet (25 mg total) by mouth 3 (three) times daily as needed for dizziness. 30 tablet 0   . metFORMIN (GLUCOPHAGE) 500 MG tablet Take by mouth 2 (two) times daily with a meal.   10/27/2015 at 1030  . metoprolol (LOPRESSOR) 50 MG tablet Take 50 mg by mouth 2 (two) times daily.   10/27/2015 at 1030  . nitroGLYCERIN (NITROSTAT) 0.4 MG SL tablet Place 0.4 mg under the tongue every 5 (five) minutes as needed for chest pain.   prn at prn  . ondansetron (ZOFRAN) 4 MG tablet Take 1 tablet (4 mg total) by mouth every 8 (eight) hours as needed for nausea or vomiting. 21 tablet 0   . rivaroxaban (XARELTO) 20 MG TABS tablet Take 20 mg by mouth daily with supper.   10/26/2015 at Unknown time  . traMADol (ULTRAM) 50 MG  tablet Take 25-50 mg by mouth every 6 (six) hours as needed for severe pain.    prn at prn   Scheduled: .  ceFAZolin (ANCEF) IV  1 g Intravenous 3 times per day  . insulin aspart  0-15 Units Subcutaneous Q4H  . insulin regular  8 Units Subcutaneous Once  . sodium chloride flush  3 mL Intravenous Q12H   Continuous: . alteplase 5 mg in sodium chloride 0.9% 50 mL infusion 5 mL/hr at 12/05/15 2045  . heparin 400 Units/hr (12/05/15 1444)  . nitroGLYCERIN     LMB:EMLJQGBEEFEOF **OR** [DISCONTINUED] acetaminophen, alum & mag hydroxide-simeth, hydrALAZINE, labetalol, metoprolol, morphine injection, ondansetron **OR** ondansetron (ZOFRAN) IV, sodium chloride flush  Results for orders placed or performed during the hospital encounter of 12/05/15 (from the past 48 hour(s))  CBC     Status: Abnormal   Collection Time: 12/05/15  3:44 AM  Result Value Ref Range   WBC 7.9 3.8 -  10.6 K/uL   RBC 3.98 (L) 4.40 - 5.90 MIL/uL   Hemoglobin 9.9 (L) 13.0 - 18.0 g/dL   HCT 31.2 (L) 40.0 - 52.0 %   MCV 78.4 (L) 80.0 - 100.0 fL   MCH 25.0 (L) 26.0 - 34.0 pg   MCHC 31.8 (L) 32.0 - 36.0 g/dL   RDW 16.7 (H) 11.5 - 14.5 %   Platelets 196 150 - 440 K/uL  Comprehensive metabolic panel     Status: Abnormal   Collection Time: 12/05/15  3:44 AM  Result Value Ref Range   Sodium 137 135 - 145 mmol/L   Potassium 4.1 3.5 - 5.1 mmol/L   Chloride 107 101 - 111 mmol/L   CO2 24 22 - 32 mmol/L   Glucose, Bld 133 (H) 65 - 99 mg/dL   BUN 15 6 - 20 mg/dL   Creatinine, Ser 0.83 0.61 - 1.24 mg/dL   Calcium 9.1 8.9 - 10.3 mg/dL   Total Protein 8.0 6.5 - 8.1 g/dL   Albumin 3.9 3.5 - 5.0 g/dL   AST 17 15 - 41 U/L   ALT 11 (L) 17 - 63 U/L   Alkaline Phosphatase 83 38 - 126 U/L   Total Bilirubin 0.4 0.3 - 1.2 mg/dL   GFR calc non Af Amer >60 >60 mL/min   GFR calc Af Amer >60 >60 mL/min    Comment: (NOTE) The eGFR has been calculated using the CKD EPI equation. This calculation has not been validated in all clinical situations. eGFR's persistently <60 mL/min signify possible Chronic Kidney Disease.    Anion gap 6 5 - 15  Heparin level (unfractionated)     Status: Abnormal   Collection Time: 12/05/15  3:44 AM  Result Value Ref Range   Heparin Unfractionated >3.60 (H) 0.30 - 0.70 IU/mL    Comment: RESULTS CONFIRMED BY MANUAL DILUTION        IF HEPARIN RESULTS ARE BELOW EXPECTED VALUES, AND PATIENT DOSAGE HAS BEEN CONFIRMED, SUGGEST FOLLOW UP TESTING OF ANTITHROMBIN III LEVELS.   Protime-INR     Status: Abnormal   Collection Time: 12/05/15  3:44 AM  Result Value Ref Range   Prothrombin Time 22.5 (H) 11.4 - 15.0 seconds   INR 1.99   APTT     Status: Abnormal   Collection Time: 12/05/15  3:44 AM  Result Value Ref Range   aPTT 41 (H) 24 - 36 seconds    Comment:        IF BASELINE aPTT IS ELEVATED, SUGGEST PATIENT RISK ASSESSMENT BE  USED TO DETERMINE APPROPRIATE ANTICOAGULANT  THERAPY.   Glucose, capillary     Status: Abnormal   Collection Time: 12/05/15  4:03 PM  Result Value Ref Range   Glucose-Capillary 146 (H) 65 - 99 mg/dL  Heparin level (unfractionated)     Status: Abnormal   Collection Time: 12/05/15  4:31 PM  Result Value Ref Range   Heparin Unfractionated 1.17 (H) 0.30 - 0.70 IU/mL    Comment:        IF HEPARIN RESULTS ARE BELOW EXPECTED VALUES, AND PATIENT DOSAGE HAS BEEN CONFIRMED, SUGGEST FOLLOW UP TESTING OF ANTITHROMBIN III LEVELS.   CBC     Status: Abnormal   Collection Time: 12/05/15  4:31 PM  Result Value Ref Range   WBC 16.6 (H) 3.8 - 10.6 K/uL   RBC 4.36 (L) 4.40 - 5.90 MIL/uL   Hemoglobin 10.9 (L) 13.0 - 18.0 g/dL   HCT 34.0 (L) 40.0 - 52.0 %   MCV 77.9 (L) 80.0 - 100.0 fL   MCH 24.9 (L) 26.0 - 34.0 pg   MCHC 32.0 32.0 - 36.0 g/dL   RDW 17.2 (H) 11.5 - 14.5 %   Platelets 195 150 - 440 K/uL  Fibrinogen     Status: None   Collection Time: 12/05/15  4:31 PM  Result Value Ref Range   Fibrinogen 253 210 - 470 mg/dL  APTT     Status: Abnormal   Collection Time: 12/05/15  4:31 PM  Result Value Ref Range   aPTT 88 (H) 24 - 36 seconds    Comment:        IF BASELINE aPTT IS ELEVATED, SUGGEST PATIENT RISK ASSESSMENT BE USED TO DETERMINE APPROPRIATE ANTICOAGULANT THERAPY.   CBC     Status: Abnormal   Collection Time: 12/05/15  7:48 PM  Result Value Ref Range   WBC 15.0 (H) 3.8 - 10.6 K/uL   RBC 4.11 (L) 4.40 - 5.90 MIL/uL   Hemoglobin 10.2 (L) 13.0 - 18.0 g/dL   HCT 32.8 (L) 40.0 - 52.0 %   MCV 79.7 (L) 80.0 - 100.0 fL   MCH 24.7 (L) 26.0 - 34.0 pg   MCHC 31.0 (L) 32.0 - 36.0 g/dL   RDW 16.8 (H) 11.5 - 14.5 %   Platelets 164 150 - 440 K/uL  Heparin level (unfractionated)     Status: None   Collection Time: 12/05/15  8:53 PM  Result Value Ref Range   Heparin Unfractionated 0.70 0.30 - 0.70 IU/mL    Comment:        IF HEPARIN RESULTS ARE BELOW EXPECTED VALUES, AND PATIENT DOSAGE HAS BEEN CONFIRMED, SUGGEST FOLLOW UP  TESTING OF ANTITHROMBIN III LEVELS.   APTT     Status: Abnormal   Collection Time: 12/05/15  8:53 PM  Result Value Ref Range   aPTT 132 (H) 24 - 36 seconds    Comment:        IF BASELINE aPTT IS ELEVATED, SUGGEST PATIENT RISK ASSESSMENT BE USED TO DETERMINE APPROPRIATE ANTICOAGULANT THERAPY.   Fibrinogen     Status: Abnormal   Collection Time: 12/05/15  8:56 PM  Result Value Ref Range   Fibrinogen 60 (LL) 210 - 470 mg/dL    Comment: CRITICAL RESULT CALLED TO, READ BACK BY AND VERIFIED WITH:  KATHY DUNN AT 2210 12/05/15 SDR   Heparin level (unfractionated)     Status: None   Collection Time: 12/06/15  3:32 AM  Result Value Ref Range   Heparin Unfractionated 0.46 0.30 - 0.70  IU/mL    Comment:        IF HEPARIN RESULTS ARE BELOW EXPECTED VALUES, AND PATIENT DOSAGE HAS BEEN CONFIRMED, SUGGEST FOLLOW UP TESTING OF ANTITHROMBIN III LEVELS. CORRECTED ON 02/11 AT 0457: PREVIOUSLY REPORTED AS 0.46        IF HEPARIN RESULTS ARE BELOW EXPECTED VALUES, AND PATIENT DOSAGE HAS BEEN CONFIRMED, SUGGEST FOLLOW UP TESTING OF ANTITHROMBIN III LEVELS.   CBC     Status: Abnormal   Collection Time: 12/06/15  3:32 AM  Result Value Ref Range   WBC 10.0 3.8 - 10.6 K/uL   RBC 3.44 (L) 4.40 - 5.90 MIL/uL   Hemoglobin 8.7 (L) 13.0 - 18.0 g/dL   HCT 27.4 (L) 40.0 - 52.0 %   MCV 79.6 (L) 80.0 - 100.0 fL   MCH 25.4 (L) 26.0 - 34.0 pg   MCHC 31.9 (L) 32.0 - 36.0 g/dL   RDW 17.0 (H) 11.5 - 14.5 %   Platelets 156 150 - 440 K/uL  Fibrinogen     Status: Abnormal   Collection Time: 12/06/15  3:32 AM  Result Value Ref Range   Fibrinogen 73 (LL) 210 - 470 mg/dL    Comment: RESULT REPEATED AND VERIFIED CRITICAL RESULT CALLED TO, READ BACK BY AND VERIFIED WITH: CALLED Mali VANDYKE AT 0458 ON 12/06/15 RWW   APTT     Status: Abnormal   Collection Time: 12/06/15  3:32 AM  Result Value Ref Range   aPTT 79 (H) 24 - 36 seconds    Comment:        IF BASELINE aPTT IS ELEVATED, SUGGEST PATIENT RISK  ASSESSMENT BE USED TO DETERMINE APPROPRIATE ANTICOAGULANT THERAPY.   Basic metabolic panel     Status: Abnormal   Collection Time: 12/06/15  3:32 AM  Result Value Ref Range   Sodium 137 135 - 145 mmol/L   Potassium 4.5 3.5 - 5.1 mmol/L   Chloride 104 101 - 111 mmol/L   CO2 23 22 - 32 mmol/L   Glucose, Bld 447 (H) 65 - 99 mg/dL   BUN 18 6 - 20 mg/dL   Creatinine, Ser 1.45 (H) 0.61 - 1.24 mg/dL   Calcium 8.3 (L) 8.9 - 10.3 mg/dL   GFR calc non Af Amer 47 (L) >60 mL/min   GFR calc Af Amer 55 (L) >60 mL/min    Comment: (NOTE) The eGFR has been calculated using the CKD EPI equation. This calculation has not been validated in all clinical situations. eGFR's persistently <60 mL/min signify possible Chronic Kidney Disease.    Anion gap 10 5 - 15  Glucose, capillary     Status: Abnormal   Collection Time: 12/06/15  5:57 AM  Result Value Ref Range   Glucose-Capillary 387 (H) 65 - 99 mg/dL    Ct Angio Low Extrem Left W/cm &/or Wo/cm  12/05/2015  CLINICAL DATA:  Acute onset of left leg pain. Cold left foot. Personal history of left-sided fem-pop bypass graft. Initial encounter. EXAM: CT ANGIOGRAPHY OF THE LEFT LOWER EXTREMITY TECHNIQUE: Multidetector CT imaging of the left lower extremity was performed using the standard protocol during bolus administration of intravenous contrast. Multiplanar CT image reconstructions and MIPs were obtained to evaluate the vascular anatomy. CONTRAST:  135m OMNIPAQUE IOHEXOL 350 MG/ML SOLN COMPARISON:  Left lower extremity venous Doppler ultrasound performed 12/02/2015 FINDINGS: There is complete occlusion of the patient's left-sided fem-pop bypass graft. This corresponds to the patient's acute symptoms. There is vague diffuse apparent aneurysmal dilatation along the course of the  fem-pop bypass graft, which also appears completely occluded. 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 A small amount of blood flow is noted tracking into the profunda femoris artery and its branches, which tracks along small branch vessels distally to the level of the knee. Diffuse calcification is noted along the abdominal aorta and its branches. 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 Visualized small and large bowel loops are grossly unremarkable. The visualized musculature is grossly unremarkable in appearance. No acute osseous abnormalities are seen. No knee joint effusion is identified. Postoperative change is noted about the prostate bed. The bladder is mildly distended and grossly unremarkable. Review of the MIP images confirms the above findings. IMPRESSION: 1. Complete occlusion of the patient's left-sided fem-pop bypass graft, corresponding to the patient's acute symptoms. Underlying vague diffuse apparent aneurysmal dilatation along the course of the fem-pop bypass graft, which also appears completely occluded. 2. 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 Small amount of blood flow tracks into the profunda femoris artery and its branches, which extends along a small branch vessels distally to the level of the knee and likely explains residual left-sided pedal pulses. 3. Diffuse calcification along the abdominal aorta and its branches. 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 Critical Value/emergent results were called by telephone at the time of interpretation on 12/05/2015 at 4:43 am to Dr. Marjean Donna, who verbally acknowledged these results. Electronically Signed   By: Garald Balding M.D.   On: 12/05/2015 04:49    Review of Systems  Constitutional: Negative for fever and chills.  HENT: Negative for sore throat and tinnitus.   Eyes: Negative for blurred vision and redness.  Respiratory: Negative  for cough and shortness of breath.   Cardiovascular: Negative for chest pain, palpitations, orthopnea and PND.  Gastrointestinal: Negative for nausea, vomiting, abdominal pain and diarrhea.  Genitourinary: Negative for dysuria, urgency and frequency.  Musculoskeletal: Negative for myalgias and joint pain.  Skin: Negative for rash.       No lesions  Neurological: Negative for speech change, focal weakness and weakness.  Endo/Heme/Allergies: Does not bruise/bleed easily.       No temperature intolerance  Psychiatric/Behavioral: Negative for depression and suicidal ideas.   Blood pressure 116/74, pulse 104, temperature 98.5 F (36.9 C), temperature source Oral, resp. rate 17, height _0  (1.93 m), weight 65.091 kg (143 lb 8 oz), SpO2 100 %. Physical Exam  Nursing note and vitals reviewed. Constitutional: He is oriented to person, place, and time. He appears well-developed and well-nourished. No distress.  HENT:  Head: Normocephalic.  Eyes: Conjunctivae and EOM are normal. Pupils are equal, round, and reactive to light. No scleral icterus.  Neck: Normal range of motion. Neck supple. No JVD present. No tracheal deviation present. No thyromegaly present.  Cardiovascular: Normal rate and regular rhythm.  Exam reveals no gallop and no friction rub.   No murmur heard. Respiratory: Effort normal and breath sounds normal. No respiratory distress.  GI: Soft. Bowel sounds are normal. He exhibits no distension. There is no tenderness.  Genitourinary:  Deferred  Musculoskeletal: Normal range of motion. He exhibits tenderness. He exhibits no edema.  Lymphadenopathy:    He has no cervical adenopathy.  Neurological: He is alert and oriented to person, place, and time. No cranial nerve deficit.  Skin:  Skin is warm and dry. No rash noted. No erythema.  Psychiatric: He has a normal mood and affect. His behavior is normal. Judgment and thought content normal.    Assessment/Plan: This is a 70 year old  African-American male with insulin-dependent diabetes mellitus admitted for an ischemic left lower extremity. 1. Hyperglycemia: Secondary to diabetes mellitus type 2. The patient's basal insulin had been held on admission and he has been on D5 maintenance fluid. Though he is nothing by mouth he should continue to have his basal insulin at reduced doses, but I do not recommend restarting basal insulin until after surgery. Currently his sugar is greater than 350. I have started him on every 4 hour fingersticks with sliding scale short acting insulin and I have stopped his maintenance fluid. It is unclear how quickly he will drop or what goal with the surgery service may have for his blood sugar prior to operating, however I can predict that he may have a surge in blood sugar as a stress response post operatively. Thus, tightly controlled blood sugar prior to surgery can be a lower priority. Restart Lantus 60 units subcutaneous this evening. Resume by mouth diet per surgical service recommendations. When eating change sliding scale to 3 times a day before meals and at bedtime. I usually continue to hold metformin during hospitalization 2. Essential hypertension: Continue labetalol when necessary (no need for metoprolol and labetalol for double beta-blockade). When the patient may take oral medications may resume lisinopril 3. CAD: Stable. Resume aspirin postoperatively. Restart Imdur postoperatively as well 4. PAD: Resume Pletal and Xarelto at the discretion of vascular surgery following arterial bypass 5. DVT prophylaxis: IV heparin for arterial clot  6. GI prophylaxis: None Thank you for involving [place] in the care of this patient. We will be happy to follow.   Harrie Foreman 12/06/2015, 6:36 AM

## 2015-12-06 NOTE — Procedures (Signed)
Intubation Procedure Note Stephen Camacho [id number] 04-17-1946  Procedure: Intubation Indications: Airway protection and maintenance  Procedure Details Consent: Risks of procedure as well as the alternatives and risks of each were explained to the (patient/caregiver).  Consent for procedure obtained. Time Out: Verified patient identification, verified procedure, site/side was marked, verified correct patient position, special equipment/implants available, medications/allergies/relevent history reviewed, required imaging and test results available.  Performed  Maximum sterile technique was used including gloves, hand hygiene and mask.  MAC and 4    Evaluation Hemodynamic Status: BP stable throughout; O2 sats: stable throughout Patient's Current Condition: stable Complications: No apparent complications Patient did tolerate procedure well. Chest X-ray ordered to verify placement.  CXR: tube position acceptable.  Procedure performed under direct supervision of Dr.Kasa. Glidoscope utilized for real-time airway visualization.    Stephen Macht S. [facility] ANP-BC Pulmonary and Critical Care Medicine [facility] Pager [phone]    12/06/2015

## 2015-12-06 NOTE — H&P (Signed)
  [facility] History & Physical Update  The patient was interviewed and re-examined.  The patient's previous History and Physical has been reviewed and is unchanged.  There is no change in the plan of care. We plan to proceed with the scheduled procedure.  Khalia Gong, MD  12/06/2015, 9:21 AM

## 2015-12-06 NOTE — Progress Notes (Signed)
Pt returned from specials.  NAD noted.  Resp even and unlabored.  Neuro intact. No pulse dopplerable in left lower extremity.

## 2015-12-06 NOTE — Progress Notes (Signed)
$'2mg'b$  versed, 151mg fentanyl and '10mg'$  Vecuronium given for RSI.  PT intubated by Magdaline NP with Dr. KMortimer Friesat bedside.  8.0 ET tube.  25cm at lip.  RT here, verifying placement and securing tube.

## 2015-12-06 NOTE — Progress Notes (Signed)
Pharmacy Antibiotic Note  Stephen Camacho is a 70 y.o. male admitted on 12/05/2015 with ischemic leg.  Pharmacy has been consulted for Vancomycin dosing.  Plan: Vancomycin 750 IV every 18 hours.  Goal trough 15-20 mcg/mL.  Height: '6\' 4"'$  (193 cm) Weight: 143 lb 8 oz (65.091 kg) IBW/kg (Calculated) : 86.8  Temp (24hrs), Avg:96.8 F (36 C), Min:96.1 F (35.6 C), Max:98.6 F (37 C)   Recent Labs Lab 12/02/15 1032 12/05/15 0344 12/05/15 1631 12/05/15 1948 12/06/15 0332 12/06/15 1508  WBC 4.6 7.9 16.6* 15.0* 10.0  --   CREATININE 0.88 0.83  --   --  1.45*  --   LATICACIDVEN  --   --   --   --   --  17.8*    Estimated Creatinine Clearance: 43.6 mL/min (by C-G formula based on Cr of 1.45).    No Known Allergies  Antimicrobials this admission: Zosyn 2/11 >>  Vancomycin 2/11 >>   Dose adjustments this admission:   Microbiology results:  Thank you for allowing pharmacy to be a part of this patient's care.  Paulina Fusi, PharmD, BCPS 12/06/2015 8:25 PM

## 2015-12-06 NOTE — Progress Notes (Signed)
Pharmacy Antibiotic Note  Stephen Camacho is a 70 y.o. male admitted on 12/05/2015 with ischemic leg, not intubated and needing treatment for asppiration pneumonia .  Pharmacy has been consulted for Zosyn dosing.  Plan: Patients CrCl is 45m/min. Will start patient on Zosyn 3.375 IV q8h EI. Pharmacy will continue to monitor labs and renal function and adjust as needed.    Height: '6\' 4"'$  (193 cm) Weight: 143 lb 8 oz (65.091 kg) IBW/kg (Calculated) : 86.8  Temp (24hrs), Avg:98.3 F (36.8 C), Min:97.7 F (36.5 C), Max:98.6 F (37 C)   Recent Labs Lab 12/02/15 1032 12/05/15 0344 12/05/15 1631 12/05/15 1948 12/06/15 0332  WBC 4.6 7.9 16.6* 15.0* 10.0  CREATININE 0.88 0.83  --   --  1.45*    Estimated Creatinine Clearance: 43.6 mL/min (by C-G formula based on Cr of 1.45).    No Known Allergies  Antimicrobials this admission: 02/10 >> Cefuroxime 02/10-02/11>> Cefazolin  02/11 >> Zosyn   Dose adjustments this admission: N/A  Microbiology results: N/A  Thank you for allowing pharmacy to be a part of this patient's care.  SNancy Fetter PharmD Pharmacy Resident  12/06/2015 3:02 PM

## 2015-12-07 ENCOUNTER — Inpatient Hospital Stay (HOSPITAL_COMMUNITY)
Admit: 2015-12-07 | Discharge: 2015-12-07 | Disposition: A | Discharge status: Home / Self Care | Payer: PPO | Attending: Cardiovascular Disease | Admitting: Cardiovascular Disease

## 2015-12-07 ENCOUNTER — Inpatient Hospital Stay: Payer: PPO

## 2015-12-07 DIAGNOSIS — T82898A Other specified complication of vascular prosthetic devices, implants and grafts, initial encounter: Secondary | ICD-10-CM | POA: Insufficient documentation

## 2015-12-07 DIAGNOSIS — R0600 Dyspnea, unspecified: Secondary | ICD-10-CM

## 2015-12-07 DIAGNOSIS — T82399A Other mechanical complication of unspecified vascular grafts, initial encounter: Secondary | ICD-10-CM

## 2015-12-07 DIAGNOSIS — I214 Non-ST elevation (NSTEMI) myocardial infarction: Secondary | ICD-10-CM | POA: Insufficient documentation

## 2015-12-07 DIAGNOSIS — R4182 Altered mental status, unspecified: Secondary | ICD-10-CM

## 2015-12-07 DIAGNOSIS — S1093XA Contusion of unspecified part of neck, initial encounter: Secondary | ICD-10-CM | POA: Insufficient documentation

## 2015-12-07 DIAGNOSIS — A419 Sepsis, unspecified organism: Secondary | ICD-10-CM | POA: Insufficient documentation

## 2015-12-07 DIAGNOSIS — R6520 Severe sepsis without septic shock: Secondary | ICD-10-CM | POA: Insufficient documentation

## 2015-12-07 LAB — BLOOD GAS, ARTERIAL
ACID-BASE EXCESS: 4.1 mmol/L — AB (ref 0.0–3.0)
Allens test (pass/fail): POSITIVE — AB
Bicarbonate: 27.4 mEq/L (ref 21.0–28.0)
FIO2: 0.4
LHR: 13 {breaths}/min
MECHVT: 500 mL
O2 Saturation: 99.7 %
PEEP: 5 cmH2O
PH ART: 7.49 — AB (ref 7.350–7.450)
Patient temperature: 37
pCO2 arterial: 36 mmHg (ref 32.0–48.0)
pO2, Arterial: 179 mmHg — ABNORMAL HIGH (ref 83.0–108.0)

## 2015-12-07 LAB — BASIC METABOLIC PANEL
Anion gap: 12 (ref 5–15)
BUN: 35 mg/dL — AB (ref 6–20)
CHLORIDE: 105 mmol/L (ref 101–111)
CO2: 24 mmol/L (ref 22–32)
Calcium: 6.9 mg/dL — ABNORMAL LOW (ref 8.9–10.3)
Creatinine, Ser: 2.78 mg/dL — ABNORMAL HIGH (ref 0.61–1.24)
GFR calc Af Amer: 25 mL/min — ABNORMAL LOW (ref >60)
GFR, EST NON AFRICAN AMERICAN: 22 mL/min — AB (ref >60)
GLUCOSE: 212 mg/dL — AB (ref 65–99)
POTASSIUM: 4 mmol/L (ref 3.5–5.1)
SODIUM: 141 mmol/L (ref 135–145)

## 2015-12-07 LAB — CBC
HEMATOCRIT: 25.1 % — AB (ref 40.0–52.0)
Hemoglobin: 8.3 g/dL — ABNORMAL LOW (ref 13.0–18.0)
MCH: 27 pg (ref 26.0–34.0)
MCHC: 33 g/dL (ref 32.0–36.0)
MCV: 81.8 fL (ref 80.0–100.0)
PLATELETS: 76 10*3/uL — AB (ref 150–440)
RBC: 3.07 MIL/uL — AB (ref 4.40–5.90)
RDW: 16 % — AB (ref 11.5–14.5)
WBC: 17.1 10*3/uL — AB (ref 3.8–10.6)

## 2015-12-07 LAB — LACTIC ACID, PLASMA
LACTIC ACID, VENOUS: 4.8 mmol/L — AB (ref 0.5–2.0)
Lactic Acid, Venous: 3.2 mmol/L (ref 0.5–2.0)
Lactic Acid, Venous: 3 mmol/L (ref 0.5–2.0)

## 2015-12-07 LAB — GLUCOSE, CAPILLARY
GLUCOSE-CAPILLARY: 195 mg/dL — AB (ref 65–99)
GLUCOSE-CAPILLARY: 182 mg/dL — AB (ref 65–99)
GLUCOSE-CAPILLARY: 169 mg/dL — AB (ref 65–99)
GLUCOSE-CAPILLARY: 155 mg/dL — AB (ref 65–99)
GLUCOSE-CAPILLARY: 163 mg/dL — AB (ref 65–99)
Glucose-Capillary: 156 mg/dL — ABNORMAL HIGH (ref 65–99)
Glucose-Capillary: 218 mg/dL — ABNORMAL HIGH (ref 65–99)

## 2015-12-07 LAB — TROPONIN I
TROPONIN I: 22.4 ng/mL — AB (ref <0.031)
TROPONIN I: 57.81 ng/mL — AB (ref <0.031)
Troponin I: 62.6 ng/mL — ABNORMAL HIGH (ref <0.031)

## 2015-12-07 LAB — CK: Total CK: 104 U/L (ref 49–397)

## 2015-12-07 LAB — HEMOGLOBIN A1C: Hgb A1c MFr Bld: 6.4 % — ABNORMAL HIGH (ref 4.0–6.0)

## 2015-12-07 LAB — PHOSPHORUS: Phosphorus: 4.8 mg/dL — ABNORMAL HIGH (ref 2.5–4.6)

## 2015-12-07 LAB — HEPARIN LEVEL (UNFRACTIONATED)
HEPARIN UNFRACTIONATED: 0.11 [IU]/mL — AB (ref 0.30–0.70)
Heparin Unfractionated: 0.17 IU/mL — ABNORMAL LOW (ref 0.30–0.70)

## 2015-12-07 LAB — MAGNESIUM: Magnesium: 1.3 mg/dL — ABNORMAL LOW (ref 1.7–2.4)

## 2015-12-07 MED ORDER — VANCOMYCIN HCL IN DEXTROSE 750-5 MG/150ML-% IV SOLN
750.0000 mg | INTRAVENOUS | Status: DC
  Administered 2015-12-08: 750 mg via INTRAVENOUS
  Filled 2015-12-07 (×2): qty 150

## 2015-12-07 MED ORDER — FENTANYL 2500MCG IN NS 250ML (10MCG/ML) PREMIX INFUSION
10.0000 ug/h | INTRAVENOUS | Status: DC
  Administered 2015-12-07: 200 ug/h via INTRAVENOUS
  Administered 2015-12-07: 50 ug/h via INTRAVENOUS
  Filled 2015-12-07 (×5): qty 250

## 2015-12-07 MED ORDER — VITAL HIGH PROTEIN PO LIQD
1000.0000 mL | ORAL | Status: DC
  Administered 2015-12-07: 1000 mL
  Administered 2015-12-08: 15:00:00
  Administered 2015-12-08: 1000 mL

## 2015-12-07 NOTE — Progress Notes (Signed)
Hgb 4.8, Hct 15, called to [place] at [facility]

## 2015-12-07 NOTE — Progress Notes (Signed)
Dr. Emmit Alexanders from [facility] ordered 3 units PRBC to be given over 2 hours each.

## 2015-12-07 NOTE — Progress Notes (Signed)
Critical lab value called from Lab.  Lactic Acid 3.0

## 2015-12-07 NOTE — Consult Note (Signed)
PULMONARY / CRITICAL CARE MEDICINE   Name: Stephen Camacho MRN: [id number] DOB: 1946/09/14    ADMISSION DATE:  12/05/2015   CONSULTATION DATE: 12/06/2015   REFERRING MD:  Delana Meyer  CHIEF COMPLAINT: Acute respiratory failure, left fem-pop bypass graft occlusion and SOB  HISTORY OF PRESENT ILLNESS:  Patient remain intubated, LA 17-->4.5 Remains sedated, vasopressors coming down Remains critically ill Troponin now 60  REVIEW OF SYSTEMS:   Unable to obtain, patient is intubated and sedated  SUBJECTIVE:    VITAL SIGNS: BP 127/73 mmHg  Pulse 104  Temp(Src) 99 F (37.2 C) (Oral)  Resp 18  Ht '6\' 4"'$  (1.93 m)  Wt 143 lb 8 oz (65.091 kg)  BMI 17.47 kg/m2  SpO2 100%  HEMODYNAMICS: CVP:  [0 mmHg-51 mmHg] 7 mmHg  VENTILATOR SETTINGS: Vent Mode:  [-] PRVC FiO2 (%):  [30 %-60 %] 40 % Set Rate:  [13 bmp-20 bmp] 13 bmp Vt Set:  [500 mL] 500 mL PEEP:  [5 cmH20] 5 cmH20 Plateau Pressure:  [15 cmH20] 15 cmH20  INTAKE / OUTPUT: I/O last 3 completed shifts: In: 37 [I.V.:4337.5; Blood:562; IV Piggyback:262.5] Out: 600 [Urine:600]  PHYSICAL EXAMINATION: General: Frail looking male,on vent intubated Neuro: GCs<8T HEENT: PERRLA, Oral mucosa with moderate amount of thick secretions  Cardiovascular: ST, s1/s2, no MRG Lungs:  Labored, bilateral airflow, scattered rhonchi in all lung fields Abdomen: Soft, NT/ND, +BS X4 Musculoskeletal:  Right AKA, left leg with well healed bypass scar Extremities: left leg cold; no palpable pulses, +femoral pulse Skin: Cold; mild discoloration in left leg  LABS:  BMET  Recent Labs Lab 12/06/15 0332 12/06/15 2007 12/07/15 0622  NA 137 142 141  K 4.5 4.1 4.0  CL 104 108 105  CO2 '23 24 24  '$ BUN 18 31* 35*  CREATININE 1.45* 2.69* 2.78*  GLUCOSE 447* 176* 212*    Electrolytes  Recent Labs Lab 12/06/15 0332 12/06/15 2007 12/07/15 0622  CALCIUM 8.3* 7.3* 6.9*  MG  --  1.4* 1.3*  PHOS  --  4.0 4.8*    CBC  Recent Labs Lab  12/06/15 0332 12/06/15 2007 12/07/15 0658  WBC 10.0 10.5 17.1*  HGB 8.7* 4.8* 8.3*  HCT 27.4* 15.0* 25.1*  PLT 156 85* 76*    Coag's  Recent Labs Lab 12/05/15 0344  12/05/15 2053 12/06/15 0332 12/06/15 0832  APTT 41*  < > 132* 79* 72*  INR 1.99  --   --   --   --   < > = values in this interval not displayed.  Sepsis Markers  Recent Labs Lab 12/06/15 1830 12/06/15 2007 12/07/15 0304 12/07/15 0623  LATICACIDVEN  --  7.5* 3.2* 4.8*  PROCALCITON 0.54  --   --   --     ABG  Recent Labs Lab 12/06/15 1607 12/06/15 1935 12/06/15 2316  PHART 7.15* 7.52* 7.43  PCO2ART 37 26* 39  PO2ART 134* 168* 173*    Liver Enzymes  Recent Labs Lab 12/05/15 0344  AST 17  ALT 11*  ALKPHOS 83  BILITOT 0.4  ALBUMIN 3.9    Cardiac Enzymes  Recent Labs Lab 12/06/15 1647 12/06/15 2007 12/07/15 0622  TROPONINI 2.41* 22.40* 62.60*    Glucose  Recent Labs Lab 12/06/15 1358 12/06/15 1816 12/06/15 2005 12/06/15 2300 12/07/15 0415 12/07/15 0754  GLUCAP 383* 214* 158* 166* 156* 218*    Imaging Dg Chest 1 View  12/07/2015  CLINICAL DATA:  Hypoxia EXAM: CHEST 1 VIEW COMPARISON:  December 06, 2015 FINDINGS: Endotracheal tube tip  is 4.1 cm above the carina. Nasogastric tube tip and side port are in the stomach. Central catheter tip is in the superior vena cava. No pneumothorax. There is postoperative change in the medial aspect of the left upper lobe. There is also postoperative change in the medial right upper lobe. There is mild scarring in the right base. There is no edema or consolidation. The heart size and pulmonary vascularity are within normal limits. No adenopathy. IMPRESSION: Postoperative change in both upper lobes. No edema or consolidation. Tube and catheter positions as described without pneumothorax. Electronically Signed   By: Lowella Grip III M.D.   On: 12/07/2015 08:39   Dg Chest Port 1 View  12/06/2015  CLINICAL DATA:  Hypoxia EXAM: PORTABLE CHEST  1 VIEW COMPARISON:  December 02, 2015 FINDINGS: Endotracheal tube tip is 4.8 cm above the carina. Nasogastric tube tip is in the stomach with the side port at the gastroesophageal junction. Central catheter tip is in the superior vena cava. No pneumothorax. There is scarring in the right and left lung bases. There is postoperative change in the medial right upper lobe region. Lungs elsewhere clear. Heart size and pulmonary vascularity are normal. No adenopathy. No bone lesions. Patient is status post coronary artery bypass grafting. IMPRESSION: Tube and catheter positions as described without pneumothorax. Note that the nasogastric tube side port is at the gastroesophageal junction. Advise advancing nasogastric tube 4-5 cm. There is bibasilar lung scarring with postoperative change in the right upper lobe. No edema or consolidation. No change in cardiac silhouette. These results will be called to the ordering clinician or representative by the Radiologist Assistant, and communication documented in the PACS or zVision Dashboard. Electronically Signed   By: Lowella Grip III M.D.   On: 12/06/2015 13:51   Dg Abd Portable 1v  12/06/2015  CLINICAL DATA:  Intubation, OG tube placement. History of coronary artery disease, lung cancer, prostate cancer, stroke and diabetes EXAM: PORTABLE ABDOMEN - 1 VIEW COMPARISON:  None. FINDINGS: The OG tube is only partially seen at the upper aspects of this exam, of uncertain position, presumably in the stomach. Bowel gas pattern is nonobstructive. Moderate amount of stool throughout the grossly nondistended colon. Hyperdense material within the right upper quadrant is of uncertain etiology or significance, possibly contrast within the gallbladder. No evidence of free intraperitoneal air seen. Osseous structures are unremarkable. IMPRESSION: 1. OG tube incompletely visualized at the upper aspects of this study. On a chest x-ray performed earlier same day, tip is seen in the  stomach but with proximal side holes at the level of the gastroesophageal junction. Recommend advancing for more optimal radiographic positioning. 2. Nonobstructive bowel gas pattern. Fairly large amount of stool within the colon (constipation? ). 3. Hyperdense material within the right upper quadrant, of uncertain etiology or significance, most likely vicarious excretion of contrast within the gallbladder related to patient's recent CT angiogram runoff. Electronically Signed   By: Franki Cabot M.D.   On: 12/06/2015 14:00    STUDIES:  02/11: STAT EKG>>  CULTURES: 02/11 Blood x2>> Urine>> Respiratory>>  ANTIBIOTICS: Piperacillin-tazobactam 02/11> Vancomycin 02/11>  SIGNIFICANT EVENTS: 02/07>ED with SOB and left calf pain>treated and released 02/09>ED with cold left foot and calf pain>CT angio>complete occlusion of the left fem-pop bypass graft>admitted 02/11>OR for angiogram with thrombolysis, thrombectomy of left peroneal artery, tibioperoneal trunk, popliteal artery, and femoral to popliteal bypass graft and percutaneous transluminal angioplasty of the left peroneal artery and distal bypass anastomosis and popliteal artery Post-op: residual thrombosis of  femoral to popliteal bypass graft and all tibial vessels; acute respiratory failure>Intubated   LINES/TUBES: Right IJ tlc 02/10> ETT 02/11>  DISCUSSION: 70 YO male with acute complete occlusion of the  left fem-pop bypass graft s/p thrombolysis/thrombectomy/percuteneous transluminal angioplasty with residual thrombosis, sepsis secondary to possible necrosis from left leg thrombosis, and  acute hypoxic respiratory failure secondary to sepsis and complicated by acute NSTEMI  ASSESSMENT / PLAN:  PULMONARY A: Acute hypoxic respiratory failure Possible aspiration pneumonia-Copious amount of secretions noted in airway during intubation Severe metabolic acidosis H/O Lung cancer s/p right lung lobectomy P:   -Full vent  support-Settings: PRVC 60%/5/20/500 -ABG pending -VAP prevention protocol -F/U CXR as needed -Albuterol and Duoneb prn -Abx as above -F/U cultures -CT neck to assess for hematoma  CARDIOVASCULAR-NSTEMI A:  Bradycardia-Resolved; Now in ST Septic shock H/O hypertension P:  -IV fluid boluses until CVP GE12 -Levophed gtte and titrate to MAP goal >65 -Hemodynamics per ICU protocol -Hold home BP meds  -cardiology consult -check ECHO  RENAL A:   AKI secondary procedural IV dye and volume depletion; baseline creatinine 0.83 P:   -IV fluids, bicarb infusion -Trend creatinine -Renally dose meds -will consider Nephrology consult  GASTROINTESTINAL A:   Constipation P:   -PPI for GI prophylaxis -Dulcolax suppo x 1 tonight -Monitor BMs -start TF's today  HEMATOLOGIC A:   Left leg ischemia-Complete Occlusion of the left fem-pop bypass graft s/p thrombolysis/PTA with residual thrombosis Right neck hematoma s/p TLC placement Severe PVD s/p Right AKA P:  -Vascular surgery following -Anticoagulation per vasc surgery -Monitor left limb for re-thrombosis - CT neck without contrast to r/o soft tissue swelling and tracheal compression -Nitroglycerin gtt per vascular -Monitor for bleeding-transfuse as needed, HGB down to 4 -DVT px-elliqious INFECTIOUS A:   Septic shock-likely source is left leg ischemia/necrosis from massive thrombus P:   -Antibiotics as above -F/U cultures -IV fluids and pressure to keep MAP>65 -F/U lactic acid level and procalcitonin LA 17-->4.8  ENDOCRINE A:   Type 2 diabetes mellitus   P:   -Blood glucose monitoring with SSI coverage -Hold oral hypoglycemic agents  NEUROLOGIC A:   Acute metabolic encephalopathy H/o CVA H/O Seizures-Not on home seizure meds P:   RASS goal: -2 -Fentanyl infusion and versed  Prn for vent sedation -Monitor for seizure activity   The Patient requires high complexity decision making for assessment and support,  frequent evaluation and titration of therapies, application of advanced monitoring technologies and extensive interpretation of multiple databases. Critical Care Time devoted to patient care services described in this note is 40 minutes.   Overall, patient is critically ill, prognosis is guarded.  Patient with Multiorgan failure and at high risk for cardiac arrest and death.    Corrin Parker, M.D.  [facility]  Medical Director Learned Director [facility] Cardio-Pulmonary Department

## 2015-12-07 NOTE — Progress Notes (Signed)
Informed Dr. Ashok Cordia at [facility] on trop 22.40.

## 2015-12-07 NOTE — Progress Notes (Signed)
[facility]  Daily Progress Note   Subjective  - 1 Day Post-Op  Intubated and sedated with severe acidosis yesterday   Was hypotensive, off pressors now and BP better Lactate was markedly elevated, now 4 Troponin over 60 Critically ill  Objective Filed Vitals:   12/07/15 0420 12/07/15 0500 12/07/15 0600 12/07/15 0820  BP:  129/73 127/73   Pulse:  95 104   Temp:  99.1 F (37.3 C) 99 F (37.2 C)   TempSrc:      Resp:  18    Height:      Weight:      SpO2: 100% 100% 100% 100%    Intake/Output Summary (Last 24 hours) at 12/07/15 0919 Last data filed at 12/07/15 0600  Gross per 24 hour  Intake 3364.3 ml  Output    400 ml  Net 2964.3 ml    PULM  CTAB CV  RRR VASC  Left leg cool to the calf, no pulses present  Laboratory CBC    Component Value Date/Time   WBC 17.1* 12/07/2015 0658   WBC 4.9 10/25/2014 1331   HGB 8.3* 12/07/2015 0658   HGB 12.1* 10/25/2014 1331   HCT 25.1* 12/07/2015 0658   HCT 38.1* 10/25/2014 1331   PLT 76* 12/07/2015 0658   PLT 151 10/25/2014 1331    BMET    Component Value Date/Time   NA 141 12/07/2015 0622   NA 138 10/25/2014 1331   K 4.0 12/07/2015 0622   K 3.9 10/25/2014 1331   CL 105 12/07/2015 0622   CL 109* 10/25/2014 1331   CO2 24 12/07/2015 0622   CO2 21 10/25/2014 1331   GLUCOSE 212* 12/07/2015 0622   GLUCOSE 232* 10/25/2014 1331   BUN 35* 12/07/2015 0622   BUN 17 10/25/2014 1331   CREATININE 2.78* 12/07/2015 0622   CREATININE 1.11 10/25/2014 1331   CALCIUM 6.9* 12/07/2015 0622   CALCIUM 8.7 10/25/2014 1331   GFRNONAA 22* 12/07/2015 0622   GFRNONAA >60 10/25/2014 1331   GFRNONAA 52* 07/08/2014 1051   GFRAA 25* 12/07/2015 0622   GFRAA >60 10/25/2014 1331   GFRAA >60 07/08/2014 1051    Assessment/Planning: POD #1/2 s/p thrombolysis and thrombectomy for ischemic leg   Now with acute MI  Hypotension on pressors (now off), bypass likely reoccluded and little we can do at this point to  salvage  Leg is cool to the calf  Cardiology to see  Would hold eliquis and start heparin drip if they would like it for MI.  Would be good for leg as well, although I think amputation likely at this point  Consider head CT to evaluate for intracranial hemorrhage  Possible aspiration pneumonia. On abx  Critically ill.  Appreciate critical care input  Stephen Camacho  12/07/2015, 9:19 AM

## 2015-12-07 NOTE — Progress Notes (Addendum)
Initial Nutrition Assessment   INTERVENTION:   EN: per MD Kasa, will initiate TF protocol to start nutrition support. Will start Vital High Protein at 42m/hr via OG tube.  RN Adam agreeable to ask MD Kasa regarding supplementation of Mg, recommend rechecking Mg and P in the am. Will make recommendations regarding titration and goal rate pending am labs and tolerance of initiation.   NUTRITION DIAGNOSIS:   Inadequate oral intake related to inability to eat as evidenced by NPO status.  GOAL:   Patient will meet greater than or equal to 90% of their needs  MONITOR:    (Energy Intake, Electrolyte and renal Profile, Anthropometrics, Digestive System, Glucose Profile, Pulmonary Profile)  REASON FOR ASSESSMENT:   Ventilator, Consult Enteral/tube feeding initiation and management  ASSESSMENT:   Per MD note: Pt admitted with acute complete occlusion of the left fem-pop bypass graft s/p thrombolysis/thrombectomy/percuteneous transluminal angioplasty with residual thrombosis, sepsis secondary to possible necrosis from left leg thrombosis, and acute hypoxic respiratory failure secondary to sepsis and complicated by acute NSTEMI.  Past Medical History  Diagnosis Date  . Hyperlipidemia   . Hypertension   . Diabetes mellitus ([place]   . CAD (coronary artery disease)     a. s/p 2 v CABG in 2012 (LIMA-LAD & SVG-OM); b. cath 07/2012 s/p PCI/DES to ostial LCx and OM  . PAD (peripheral artery disease) ([facility]     a. s/p right SFA stent; b. s/p right toe amputation 2011; c. s/p right AKA spring 2016  . Seizures ([facility]   . Gangrene of foot ([facility]   . Wheezing   . Lung cancer ([facility]   . Prostate cancer ([facility]   . Stroke ([facility]    Past Surgical History  Procedure Laterality Date  . Right aka  07-10-2014  . Left femoral popliteal bypass    . Right lung lobeectomy    . Coronary artery bypass graft       Diet Order:  Diet NPO time specified    Current Nutrition: NPO currently intubated on the  vent   Scheduled Medications:  . antiseptic oral rinse  7 mL Mouth Rinse QID  . bisacodyl  10 mg Rectal Once  . chlorhexidine gluconate  15 mL Mouth Rinse BID  . insulin aspart  0-15 Units Subcutaneous Q4H  . insulin regular  8 Units Subcutaneous Once  . ipratropium-albuterol  3 mL Nebulization Q6H  . pantoprazole (PROTONIX) IV  40 mg Intravenous Daily  . piperacillin-tazobactam (ZOSYN)  IV  3.375 g Intravenous 3 times per day  . sodium chloride flush  3 mL Intravenous Q12H  . [START ON 12/08/2015] vancomycin  750 mg Intravenous Q36H    Continuous Medications:  . fentaNYL infusion INTRAVENOUS 200 mcg/hr (12/07/15 1043)  . lactated ringers 100 mL (12/06/15 1946)  . nitroGLYCERIN    . norepinephrine (LEVOPHED) Adult infusion 1 mcg/min (12/06/15 2312)     Electrolyte/Renal Profile and Glucose Profile:   Recent Labs Lab 12/06/15 0332 12/06/15 2007 12/07/15 0622  NA 137 142 141  K 4.5 4.1 4.0  CL 104 108 105  CO2 '23 24 24  '$ BUN 18 31* 35*  CREATININE 1.45* 2.69* 2.78*  CALCIUM 8.3* 7.3* 6.9*  MG  --  1.4* 1.3*  PHOS  --  4.0 4.8*  GLUCOSE 447* 176* 212*   Protein Profile:   Recent Labs Lab 12/05/15 0344  ALBUMIN 3.9    Gastrointestinal Profile: Last BM: 12/07/2015   Nutrition-Focused Physical Exam Findings:  Unable to complete Nutrition-Focused  physical exam at this time.    Weight Change: Per [facility] weight encounters, weight 143lbs in August 2016.   Height:   Ht Readings from Last 1 Encounters:  12/05/15 '6\' 4"'$  (1.93 m)    Weight:   Wt Readings from Last 1 Encounters:  12/05/15 143 lb 8 oz (65.091 kg)   Wt Readings from Last 10 Encounters:  12/05/15 143 lb 8 oz (65.091 kg)  12/02/15 176 lb (79.833 kg)  10/27/15 176 lb (79.833 kg)  06/24/15 143 lb 4.8 oz (65 kg)  02/23/15 56 lb 3.5 oz (25.5 kg)  07/31/14 135 lb 8 oz (61.462 kg)  07/18/14 135 lb 8 oz (61.462 kg)    Ideal Body Weight:   91kg  BMI:  Body mass index is 17.47 kg/(m^2).  Estimated  Nutritional Needs:   Kcal:  1738kcals, using [employer] Equation with ABW of 65kg, (BEE: 1506kcals, Temp: 37.7, Ve: 6.5)  Protein:  136-182g protein (1.5-2.0g/kg) using IBW of 91kg  Fluid:  2275-2769m of fluid (25-355mkg) using IBW of 91kg   HIGH Care Level  AlDwyane LuoRD, LDN Pager ([phone]/On-Call Pager [phone]

## 2015-12-07 NOTE — Consult Note (Addendum)
Cardiology Consultation Note  Patient ID: Stephen Camacho, MRN: [id number], DOB/AGE: 04/29/1946 70 y.o. Admit date: 12/05/2015   Date of Consult: 12/07/2015 Primary Physician: Beatriz Stallion, MD [facility]) Primary Cardiologist: [facility]  Chief Complaint: NSTEMI Reason for Consult:NSTEMI Consult placed by: Dr. Mortimer Fries  HPI: 70 y.o. 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 Cardiology has been consultation by Dr. Mortimer Fries for elevated troponin consistent with non-ST elevation MI.  He was taken to the [facility] lab acutely for increasing left leg pain, ischemic leg February 10, CT scan confirming occlusion of the bypass that had been placed in 2013. He had angiography showing left SFA flush occlusion with bypass graft with flush occlusion . Profunda femoris was opened though significant thrombus within the graft with poor distal outflow and thrombus within the tibials . It was felt by vascular surgery that he was at high risk of losing his left leg.   Following the procedure had mental status changes, obtunded, increased work of breathing, in the intensive care unit, required intubation. Following this had hypotension with systolic pressures into the 70s, bradycardia with rate into the 30s, mild improvement with atropine. Dramatic decline in his blood count down to hemoglobin 4.8. He received several units of blood, up to more than 8 this morning, 5 L of fluid for volume resuscitation. Pressors used, weaned last night as systolic pressures stabilized.   He had thrombolysis overnight. Felt to  have sepsis given his dramatic hemodynamic instability.    Initial troponin was minimally elevated, increasing up to 20 and then 60 this morning. Currently intubated Lab work this morning showing worsening renal dysfunction Also CT scan of the head and neck showing new stroke, embolic, nonhemorrhagic infarcts involving the right PCA territory and bilateral cerebellum, left greater than right, in addition with hemorrhage around the right neck, possible line misplacement  Case discussed with Dr.  Mortimer Fries and Dr. Lucky Cowboy   other past medical history reviewed   08/24/2012  cardiac cath,  successful PCI to an 80% stenosis in the OM branch with placement of a Promus Element Plus 2.5 mm X 28 mm drug eluting stent with 0% residual stenosis and TIMI 3 flow and successful PCI to a subtotal occlusion of ostial LCx with placement of a 2.5 mm X 16 mm Promus Element Plus drug eluting stent with 0% residual stenosis and TIMI 3 flow.   Stress test August 2016 showing no ischemia    right AKA in 06/2014 after suffering thrombosis of his arterial bypass graft in around July 2015. Attempts at salvage were unsuccessful.      Past Medical History  Diagnosis Date  . Hyperlipidemia   . Hypertension   . Diabetes mellitus ([place])   . CAD (coronary artery disease)     a. s/p 2 v CABG in 2012 (LIMA-LAD & SVG-OM); b. cath 07/2012 s/p PCI/DES to ostial LCx and OM  . PAD (peripheral artery disease) ([place])     a. s/p right SFA stent; b. s/p right toe amputation 2011; c. s/p right AKA spring 2016  .  Seizures ([place])   . Gangrene of foot ([place])   . Wheezing   . Lung cancer ([place])   . Prostate cancer ([place])   . Stroke [facility])       Most Recent Cardiac Studies: Cardiac bypass 2012:  LIMA to LAD, SVG to OM  Cardiac catheterization 08/24/2012:  Successful PCI to an 80% stenosis in the OM branch with placement of a Promus Element Plus 2.5 mm X 28 mm drug eluting stent with 0% residual stenosis  and TIMI 3 flow. Successful PCI to  a subtotal occlusion of ostial LCx with placement of a 2.5 mm X 16 mm Promus Element Plus drug eluting stent with 0% residual stenosis and TIMI 3 flow    Surgical History:  Past Surgical History  Procedure Laterality Date  . Right aka  07-10-2014  . Left femoral popliteal bypass    . Right lung lobeectomy    . Coronary artery bypass graft       Home Meds: Prior to Admission medications   Medication Sig Start Date End Date Taking? Authorizing Provider  aspirin 81 MG tablet Take 81 mg by mouth daily.    Historical Provider, MD  atorvastatin (LIPITOR) 40 MG tablet Take 40 mg by mouth daily at 6 PM.    Historical Provider, MD  insulin lispro (HUMALOG) 100 UNIT/ML injection Inject 6 Units into the skin 2 (two) times daily before a meal.    Historical Provider, MD  isosorbide mononitrate (IMDUR) 60 MG 24 hr tablet Take 60 mg by mouth daily.    Historical Provider, MD  lisinopril (PRINIVIL,ZESTRIL) 10 MG tablet Take 10 mg by mouth daily.    Historical Provider, MD  metFORMIN (GLUCOPHAGE) 500 MG tablet Take by mouth 2 (two) times daily with a meal.    Historical Provider, MD  metoprolol (LOPRESSOR) 50 MG tablet Take 50 mg by mouth 2 (two) times daily.    Historical Provider, MD  oxyCODONE-acetaminophen (PERCOCET/ROXICET) 5-325 MG per tablet Take one to two tablets by mouth every 6 hours as needed for pain 08/05/14   Lauree Chandler, NP    Inpatient Medications:  . antiseptic oral rinse  7 mL Mouth Rinse QID  . bisacodyl  10 mg Rectal Once  . chlorhexidine gluconate  15 mL Mouth Rinse BID  . insulin aspart  0-15 Units Subcutaneous Q4H  . insulin regular  8 Units Subcutaneous Once  . ipratropium-albuterol  3 mL Nebulization Q6H  . pantoprazole (PROTONIX) IV  40 mg Intravenous Daily  . piperacillin-tazobactam (ZOSYN)  IV  3.375 g Intravenous 3 times per day  . sodium chloride flush  3 mL Intravenous Q12H  . [START ON 12/08/2015] vancomycin  750 mg Intravenous Q36H   . fentaNYL infusion  INTRAVENOUS 200 mcg/hr (12/07/15 1043)  . lactated ringers 100 mL (12/06/15 1946)  . nitroGLYCERIN    . norepinephrine (LEVOPHED) Adult infusion 1 mcg/min (12/06/15 2312)    Allergies: No Known Allergies  Social History   Social History  . Marital Status: Widowed    Spouse Name: N/A  . Number of Children: N/A  . Years of Education: N/A   Occupational History  . Not on file.   Social History Main Topics  . Smoking status: Never Smoker   . Smokeless tobacco: Not on file  . Alcohol Use: No  . Drug Use: Not on file  . Sexual Activity: Not on file   Other Topics Concern  . Not on file   Social History Narrative  Family History  Problem Relation Age of Onset  . Family history unknown: Yes      Review of Systems: Review of Systems  Unable to perform ROS  Patient is intubated, unable to communicate   Labs:  Recent Labs  12/06/15 1647 12/06/15 2007 12/07/15 0622 12/07/15 0658  TROPONINI 2.41* 22.40* 62.60* 57.81*   Lab Results  Component Value Date   WBC 17.1* 12/07/2015   HGB 8.3* 12/07/2015   HCT 25.1* 12/07/2015   MCV 81.8 12/07/2015   PLT 76* 12/07/2015     Recent Labs Lab 12/05/15 0344  12/07/15 0622  NA 137  < > 141  K 4.1  < > 4.0  CL 107  < > 105  CO2 24  < > 24  BUN 15  < > 35*  CREATININE 0.83  < > 2.78*  CALCIUM 9.1  < > 6.9*  PROT 8.0  --   --   BILITOT 0.4  --   --   ALKPHOS 83  --   --   ALT 11*  --   --   AST 17  --   --   GLUCOSE 133*  < > 212*  < > = values in this interval not displayed. No results found for: CHOL, HDL, LDLCALC, TRIG No results found for: DDIMER  Radiology/Studies:  Dg Chest 1 View  12/07/2015  CLINICAL DATA:  . IMPRESSION: Postoperative change in both upper lobes. No edema or consolidation. Tube and catheter positions as described without pneumothorax. Electronically Signed   By: Lowella Grip III M.D.   On: 12/07/2015 08:39   Dg Chest 2 View  12/02/2015  CLINICAL DATA:   IMPRESSION: No active  cardiopulmonary disease. Electronically Signed   By: Kathreen Devoid   On: 12/02/2015 11:10   Ct Head Wo Contrast  12/07/2015  CLINICAL DATA: . IMPRESSION: 1. 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 2. Otherwise stable extensive white matter disease. 3. The right neck venous catheter appears to be extra luminal at least to the thoracic inlet. 4. Asymmetric subcutaneous and intramuscular edema and possibly hemorrhage within the right neck and chest. The extraluminal position of the catheter may be contributing. 5. No discrete fluid collection or hemorrhage in the neck. 6. Atherosclerosis. These results were called by telephone at the time of interpretation on 12/07/2015 at 10:02 am to Dr. Mortimer Fries , who verbally acknowledged these results. Electronically Signed   By: San Morelle M.D.   On: 12/07/2015 10:04   Ct Soft Tissue Neck Wo Contrast  12/07/2015  CLINICAL DATA: . IMPRESSION: 1. 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 2. Otherwise stable extensive white matter disease. 3. The right neck venous catheter appears to be extra luminal at least to the thoracic inlet. 4. Asymmetric subcutaneous and intramuscular edema and possibly hemorrhage within the right neck and chest. The extraluminal position of the catheter may be contributing. 5. No discrete fluid collection or hemorrhage in the neck. 6. Atherosclerosis. These results were called by telephone at the time of interpretation on 12/07/2015 at 10:02 am to Dr. Mortimer Fries , who verbally acknowledged these results. Electronically Signed   By: San Morelle M.D.   On: 12/07/2015 10:04   Ct Angio Low Extrem Left W/cm &/or Wo/cm  12/05/2015  CLINICAL DATA:   IMPRESSION: 1. Complete occlusion of the patient's left-sided fem-pop bypass graft, corresponding to the patient's acute symptoms. Underlying  vague diffuse  apparent aneurysmal dilatation along the course of the fem-pop bypass graft, which also appears completely occluded. 2. 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 Small amount of blood flow tracks into the profunda femoris artery and its branches, which extends along a small branch vessels distally to the level of the knee and likely explains residual left-sided pedal pulses. 3. Diffuse calcification along the abdominal aorta and its branches. 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 Critical Value/emergent results were called by telephone at the time of interpretation on 12/05/2015 at 4:43 am to Dr. Marjean Donna, who verbally acknowledged these results. Electronically Signed   By: Garald Balding M.D.   On: 12/05/2015 04:49   US Venous Img Lower Unilateral Left  12/02/2015  CLINICAL DATA:IMPRESSION: No evidence of left lower extremity DVT. Electronically Signed   By: Evangeline Dakin M.D.   On: 12/02/2015 12:02   Dg Chest Port 1 View  12/06/2015  CLINICAL DATA:   IMPRESSION: Tube and catheter positions as described without pneumothorax. Note that the nasogastric tube side port is at the gastroesophageal junction. Advise advancing nasogastric tube 4-5 cm. There is bibasilar lung scarring with postoperative change in the right upper lobe. No edema or consolidation. No change in cardiac silhouette. These results will be called to the ordering clinician or representative by the Radiologist Assistant, and communication documented in the PACS or zVision Dashboard. Electronically Signed   By: Lowella Grip III M.D.   On: 12/06/2015 13:51   Dg Abd Portable 1v  12/06/2015  CLINICAL DATA:  Intubation, OG tube placement. History of coronary artery disease, lung cancer, prostate cancer, stroke and diabetes . IMPRESSION: 1. OG tube incompletely visualized at the upper aspects of this study. On a  chest x-ray performed earlier same day, tip is seen in the stomach but with proximal side holes at the level of the gastroesophageal junction. Recommend advancing for more optimal radiographic positioning. 2. Nonobstructive bowel gas pattern. Fairly large amount of stool within the colon (constipation? ). 3. Hyperdense material within the right upper quadrant, of uncertain etiology or significance, most likely vicarious excretion of contrast within the gallbladder related to patient's recent CT angiogram runoff. Electronically Signed   By: Franki Cabot M.D.   On: 12/06/2015 14:00    EKG: Sinus tachycardia with rate 106 bpm, poor R-wave progression to the anterior precordial leads, unable to exclude old anterior MI, nonspecific ST abnormality  EKG and above imaging studies reviewed independently by myself  Weights: Filed Weights   12/05/15 0645  Weight: 143 lb 8 oz (65.091 kg)     Physical Exam: sinus tachycardia, no significant arrhythmia  Blood pressure 106/66, pulse 99, temperature 98.6 F (37 C), temperature source Oral, resp. rate 12, height '6\' 4"'$  (1.93 m), weight 143 lb 8 oz (65.091 kg), SpO2 100 %. Body mass index is 17.47 kg/(m^2). General: Well developed, well nourished, intubated  Head: Normocephalic, atraumatic, sclera non-icteric, no xanthomas, nares are without discharge.  Neck: + carotid bruits L>R. JVcould not be evaluated Lungs: Coarse breath sounds bilaterally Heart: RRR with S1 S2. No murmurs, rubs, or gallops appreciated. Abdomen: Soft, non-distended with normoactive bowel sounds.  No rebound/guarding. No obvious abdominal masses. [id number] to test as he is sedated, intubated es: No clubbing or cyanosis. No edema. Right AKA.  Neuro: Sedated, arousable, unable to test   Psych:  sedated but arousable     Assessment and  Plan:  70 y.o. 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   yesterday afternoon with profound hypotension, bradycardia, respiratory distress requiring intubation, pressors, fluid resuscitation. Now with dramatic troponin elevation up to 60  1. NSTEMI He does have severe underlying coronary artery disease, history of bypass surgery and stenting   stress test August 2016 with no significant ischemia Dramatic climb in his troponin in the setting of life-threatening respiratory distress, hypotension, bradycardia, sepsis/acidosis. Symptoms improved with intubation and pressors, atropine, stable overnight. --Currently not in a position to be taken to the cardiac catheterization lab given his instability, sepsis, possible need for amputation of his left leg per the notes. 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 New stroke New nonhemorrhagic infarcts involving the right PCA territory and bilateral cerebellum, left greater than right. This possibly indicates embolic phenomenon Echocardiogram pending Unable to fully anticoagulate at this time secondary to new strokes, hemorrhage in the right neck at the site of line placement   4.  DM2: Managed by critical care   5. septic shock  Critical condition yesterday with hypotension, bradycardia, blood loss, respiratory distress   had packed red blood cells, fluid resuscitation, stable blood pressure overnight CT scan concerning for hemorrhage of the right neck  6. Emphysema/COPD Prior history of smoking, bullae seen on CT scan August 2016, worse on the right than the left  7. Cad/CABG: High risk of ischemia in the setting of hypotension, bradycardia Currently not a good candidate for catheterization  8. History of right lung carcinoma s/p lobectomy:  9: Hemorrhage in the right neck Possible line placement issue, suggested on CT scan, extraluminal We'll defer to critical care team, Dr. Lucky Cowboy    Total encounter time more than 110 minutes  Greater than 50% was spent in counseling and coordination of care with the patient    Signed, Signed, Esmond Plants, MD, Ph.D [facility]

## 2015-12-07 NOTE — Progress Notes (Signed)
Pharmacy Antibiotic Note  Stephen Camacho is a 70 y.o. male admitted on 12/05/2015 with ischemic leg.  Pharmacy has been consulted for Vancomycin dosing.   Plan: Patient currently ordered Vancomycin 750 mg IV q18h.  SCr doubled overnight.  Will transition patient to Vancomycin 750 mg IV q36h for estimated trough ~15.  Will need to follow renal function closely for further adjustments.   Height: '6\' 4"'$  (193 cm) Weight: 143 lb 8 oz (65.091 kg) IBW/kg (Calculated) : 86.8  Temp (24hrs), Avg:99 F (37.2 C), Min:96.1 F (35.6 C), Max:100 F (37.8 C)   Recent Labs Lab 12/02/15 1032 12/05/15 0344 12/05/15 1631 12/05/15 1948 12/06/15 0332 12/06/15 1508 12/06/15 2007 12/07/15 0304 12/07/15 0622 12/07/15 0623 12/07/15 0658  WBC 4.6 7.9 16.6* 15.0* 10.0  --  10.5  --   --   --  17.1*  CREATININE 0.88 0.83  --   --  1.45*  --  2.69*  --  2.78*  --   --   LATICACIDVEN  --   --   --   --   --  17.8* 7.5* 3.2*  --  4.8*  --     Estimated Creatinine Clearance: 22.8 mL/min (by C-G formula based on Cr of 2.78).    No Known Allergies  Antimicrobials this admission: Zosyn 2/11 >>  Vancomycin 2/11 >>   Dose adjustments this admission: Dosing changed to q36h based on dramatic increase in SCr overnight.  Patient received dose today at 0548.  Trough rescheduled at 2/15 at 0530.  Microbiology results: No results found for this or any previous visit.  Thank you for allowing pharmacy to be a part of this patient's care.  Murrell Converse, PharmD Clinical Pharmacist 12/07/2015

## 2015-12-07 NOTE — Consult Note (Signed)
[facility] CONSULT NOTE    Date: 12/07/2015                  Patient Name:  Stephen Camacho  MRN: [id number]  DOB: 15-Oct-1946  Age / Sex: 70 y.o., male         PCP: Pcp Not In System                 Service Requesting Consult: Dr. Melina Schools                 Reason for Consult: Acute renal failure            History of Present Illness: Patient is a 70 y.o. male with a PMHx of hypertension, hyperlipidemia, diabetes mellitus, coronary artery disease status post 2 vessel CABG, peripheral arterial disease status post right above-the-knee amputation, seizure disorder, lung cancer, prostate cancer, history of CVA, who was admitted to [facility] on 12/05/2015 for ischemic left lower extremity. The patient underwent angiogram of the left lower extremity and had mechanical thrombectomy of the left peroneal artery, tibioperoneal trunk, popliteal artery, and femoral to popliteal bypass graft. He also had stent placement performed. As part of that procedure he received 65 cc of contrast. Postoperatively he did not do well. He ended up intubated and placed on the ventilator. He was also hypotensive for a period of time. In addition he has suffered a myocardial infarction.  His baseline creatinine is 0.8.  It has now risen to 2.7. However patient is producing urine at the moment.  Over the preceding 24 hours he made 400 cc of urine.   Medications: Outpatient medications: Prescriptions prior to admission  Medication Sig Dispense Refill Last Dose  . acetaminophen (TYLENOL) 650 MG CR tablet Take 650 mg by mouth every 8 (eight) hours as needed for pain.   prn at prn  . aspirin 81 MG chewable tablet Chew 81 mg by mouth daily.   10/27/2015 at 1030  . atorvastatin (LIPITOR) 40 MG tablet Take 40 mg by mouth daily at 6 PM.   10/26/2015 at Unknown time  . cilostazol (PLETAL) 50 MG tablet Take 50 mg by mouth 2 (two) times daily.   10/27/2015 at 1030  . insulin glargine (LANTUS) 100 UNIT/ML  injection Inject 10 Units into the skin at bedtime.   10/26/2015 at Unknown time  . isosorbide mononitrate (IMDUR) 60 MG 24 hr tablet Take 60 mg by mouth daily.   10/27/2015 at 1030  . lisinopril (PRINIVIL,ZESTRIL) 10 MG tablet Take 10 mg by mouth daily.   10/27/2015 at 1030  . meclizine (ANTIVERT) 25 MG tablet Take 1 tablet (25 mg total) by mouth 3 (three) times daily as needed for dizziness. 30 tablet 0   . metFORMIN (GLUCOPHAGE) 500 MG tablet Take by mouth 2 (two) times daily with a meal.   10/27/2015 at 1030  . metoprolol (LOPRESSOR) 50 MG tablet Take 50 mg by mouth 2 (two) times daily.   10/27/2015 at 1030  . nitroGLYCERIN (NITROSTAT) 0.4 MG SL tablet Place 0.4 mg under the tongue every 5 (five) minutes as needed for chest pain.   prn at prn  . ondansetron (ZOFRAN) 4 MG tablet Take 1 tablet (4 mg total) by mouth every 8 (eight) hours as needed for nausea or vomiting. 21 tablet 0   . rivaroxaban (XARELTO) 20 MG TABS tablet Take 20 mg by mouth daily with supper.   10/26/2015 at Unknown time  . traMADol (ULTRAM) 50 MG tablet Take  25-50 mg by mouth every 6 (six) hours as needed for severe pain.    prn at prn    Current medications: Current Facility-Administered Medications  Medication Dose Route Frequency Provider Last Rate Last Dose  . acetaminophen (TYLENOL) tablet 650 mg  650 mg Per Tube Q6H PRN Mikael Spray, NP      . albuterol (PROVENTIL) (2.5 MG/3ML) 0.083% nebulizer solution 2.5 mg  2.5 mg Nebulization Q4H PRN Mikael Spray, NP      . antiseptic oral rinse solution (CORINZ)  7 mL Mouth Rinse QID Mikael Spray, NP   7 mL at 12/07/15 0419  . bisacodyl (DULCOLAX) suppository 10 mg  10 mg Rectal Daily PRN Mikael Spray, NP      . bisacodyl (DULCOLAX) suppository 10 mg  10 mg Rectal Once Mikael Spray, NP   10 mg at 12/06/15 2000  . chlorhexidine gluconate (PERIDEX) 0.12 % solution 15 mL  15 mL Mouth Rinse BID Mikael Spray, NP   15 mL at 12/07/15 0800  . fentaNYL  (SUBLIMAZE) injection 50 mcg  50 mcg Intravenous Q2H PRN Mikael Spray, NP   50 mcg at 12/07/15 0655  . fentaNYL 2514mg in NS 2560m(1052mml) infusion-PREMIX  10 mcg/hr Intravenous Continuous KurFlora LippsD 20 mL/hr at 12/07/15 1043 200 mcg/hr at 12/07/15 1043  . hydrALAZINE (APRESOLINE) injection 5 mg  5 mg Intravenous Q20 Min PRN GreKatha CabalD      . insulin aspart (novoLOG) injection 0-15 Units  0-15 Units Subcutaneous Q4H MicHarrie ForemanD   3 Units at 12/07/15 1135  . insulin regular (NOVOLIN R,HUMULIN R) 100 units/mL injection 8 Units  8 Units Subcutaneous Once MicHarrie ForemanD      . ipratropium-albuterol (DUONEB) 0.5-2.5 (3) MG/3ML nebulizer solution 3 mL  3 mL Nebulization Q6H MagMikael SprayP   3 mL at 12/07/15 0817  . labetalol (NORMODYNE,TRANDATE) injection 10 mg  10 mg Intravenous Q10 min PRN GreKatha CabalD   10 mg at 12/05/15 1837  . lactated ringers infusion   Intravenous Continuous MagMikael SprayP 100 mL/hr at 12/06/15 1946 100 mL at 12/06/15 1946  . metoprolol (LOPRESSOR) injection 2-5 mg  2-5 mg Intravenous Q2H PRN GreKatha CabalD      . midazolam (VERSED) injection 1 mg  1 mg Intravenous Q2H PRN MagMikael SprayP   1 mg at 12/07/15 0106  . nitroGLYCERIN 50 mg in dextrose 5 % 250 mL (0.2 mg/mL) infusion  5-250 mcg/min Intravenous Titrated GreKatha CabalD      . norepinephrine (LEVOPHED) 4 mg in dextrose 5 % 250 mL (0.016 mg/mL) infusion  2-50 mcg/min Intravenous Continuous MagMikael SprayP 3.8 mL/hr at 12/06/15 2312 1 mcg/min at 12/06/15 2312  . ondansetron (ZOFRAN) tablet 4 mg  4 mg Oral Q6H PRN GreKatha CabalD       Or  . ondansetron (ZOPl[facility] Of [facility]issippinjection 4 mg  4 mg Intravenous Q6H PRN GreKatha CabalD      . pantoprazole (PROTONIX) injection 40 mg  40 mg Intravenous Daily MagMikael SprayP   40 mg at 12/07/15 1038  . piperacillin-tazobactam (ZOSYN) IVPB 3.375 g  3.375 g Intravenous 3 times per day  ShePernell DuprePH   3.375 g at 12/07/15 0548  . sennosides (SENOKOT) 8.8 MG/5ML syrup 5 mL  5 mL Per Tube BID PRN MagMikael SprayP      .  sodium chloride flush (NS) 0.9 % injection 3 mL  3 mL Intravenous Q12H [name], MD   3 mL at 12/07/15 1000  . sodium chloride flush (NS) 0.9 % injection 3 mL  3 mL Intravenous PRN [name], MD      . Derrill Memo ON 12/08/2015] vancomycin (VANCOCIN) IVPB 750 mg/150 ml premix  750 mg Intravenous Q36H Crystal Jennefer Bravo, RPH          Allergies: No Known Allergies    Past Medical History: Past Medical History  Diagnosis Date  . Hyperlipidemia   . Hypertension   . Diabetes mellitus ([place])   . CAD (coronary artery disease)     a. s/p 2 v CABG in 2012 (LIMA-LAD & SVG-OM); b. cath 07/2012 s/p PCI/DES to ostial LCx and OM  . PAD (peripheral artery disease) ([place])     a. s/p right SFA stent; b. s/p right toe amputation 2011; c. s/p right AKA spring 2016  . Seizures ([place])   . Gangrene of foot ([place])   . Wheezing   . Lung cancer ([place])   . Prostate cancer ([place])   . Stroke [facility])      Past Surgical History: Past Surgical History  Procedure Laterality Date  . Right aka  07-10-2014  . Left femoral popliteal bypass    . Right lung lobeectomy    . Coronary artery bypass graft       Family History: Family History  Problem Relation Age of Onset  . Family history unknown: Yes     Social History: Social History   Social History  . Marital Status: Widowed    Spouse Name: N/A  . Number of Children: N/A  . Years of Education: N/A   Occupational History  . Not on file.   Social History Main Topics  . Smoking status: Never Smoker   . Smokeless tobacco: Not on file  . Alcohol Use: No  . Drug Use: Not on file  . Sexual Activity: Not on file   Other Topics Concern  . Not on file   Social History Narrative     Review of Systems: Unable to provide as he's on the ventilator  Vital Signs: Blood pressure 106/66, pulse  99, temperature 98.6 F (37 C), temperature source Oral, resp. rate 12, height '6\' 4"'$  (1.93 m), weight 65.091 kg (143 lb 8 oz), SpO2 100 %.  Weight trends: Filed Weights   12/05/15 0645  Weight: 65.091 kg (143 lb 8 oz)    Physical Exam: General: Critically ill appearing.  Head: Normocephalic, atraumatic, ETT/OG   Eyes: Anicteric, pupils 103m, sluggish to react  Nose: Mucous membranes moist, not inflammed, nonerythematous.  Throat: ETT in place.   Neck: Supple, trachea midline.  Lungs:  Bilateral rhonchi, vent assisted  Heart: S1S2 no rubs.  Abdomen:  BS normoactive. Soft, Nondistended, non-tender.  No masses or organomegaly.  Extremities: Right AKA, left LLE cool to the touch.  Neurologic: Intubated, not following commands  Skin: No visible rashes, scars.    Lab results: Basic Metabolic Panel:  Recent Labs Lab 12/06/15 0332 12/06/15 2007 12/07/15 0622  NA 137 142 141  K 4.5 4.1 4.0  CL 104 108 105  CO2 '23 24 24  '$ GLUCOSE 447* 176* 212*  BUN 18 31* 35*  CREATININE 1.45* 2.69* 2.78*  CALCIUM 8.3* 7.3* 6.9*  MG  --  1.4* 1.3*  PHOS  --  4.0 4.8*    Liver Function Tests:  Recent Labs Lab 12/05/15  0344  AST 17  ALT 11*  ALKPHOS 83  BILITOT 0.4  PROT 8.0  ALBUMIN 3.9   No results for input(s): LIPASE, AMYLASE in the last 168 hours. No results for input(s): AMMONIA in the last 168 hours.  CBC:  Recent Labs Lab 12/05/15 1631 12/05/15 1948 12/06/15 0332 12/06/15 2007 12/07/15 0658  WBC 16.6* 15.0* 10.0 10.5 17.1*  HGB 10.9* 10.2* 8.7* 4.8* 8.3*  HCT 34.0* 32.8* 27.4* 15.0* 25.1*  MCV 77.9* 79.7* 79.6* 78.1* 81.8  PLT 195 164 156 85* 76*    Cardiac Enzymes:  Recent Labs Lab 12/02/15 1339 12/06/15 1647 12/06/15 2007 12/07/15 0622 12/07/15 0658  TROPONINI <0.03 2.41* 22.40* 62.60* 57.81*    BNP: Invalid input(s): POCBNP  CBG:  Recent Labs Lab 12/06/15 2005 12/06/15 2300 12/07/15 0415 12/07/15 0754 12/07/15 1131  GLUCAP 158* 166* 156*  218* 195*    Microbiology: No results found for this or any previous visit.  Coagulation Studies:  Recent Labs  12/05/15 0344  LABPROT 22.5*  INR 1.99    Urinalysis: No results for input(s): COLORURINE, LABSPEC, PHURINE, GLUCOSEU, HGBUR, BILIRUBINUR, KETONESUR, PROTEINUR, UROBILINOGEN, NITRITE, LEUKOCYTESUR in the last 72 hours.  Invalid input(s): APPERANCEUR    Imaging: Dg Chest 1 View  12/07/2015  CLINICAL DATA:  Hypoxia EXAM: CHEST 1 VIEW COMPARISON:  December 06, 2015 FINDINGS: Endotracheal tube tip is 4.1 cm above the carina. Nasogastric tube tip and side port are in the stomach. Central catheter tip is in the superior vena cava. No pneumothorax. There is postoperative change in the medial aspect of the left upper lobe. There is also postoperative change in the medial right upper lobe. There is mild scarring in the right base. There is no edema or consolidation. The heart size and pulmonary vascularity are within normal limits. No adenopathy. IMPRESSION: Postoperative change in both upper lobes. No edema or consolidation. Tube and catheter positions as described without pneumothorax. Electronically Signed   By: Lowella Grip III M.D.   On: 12/07/2015 08:39   Ct Head Wo Contrast  12/07/2015  CLINICAL DATA:  Encephalopathy. Soft tissue swelling in the neck. Occlusion of lower extremity graft. EXAM: CT HEAD WITHOUT CONTRAST CT NECK WITHOUT CONTRAST TECHNIQUE: Contiguous axial images were obtained from the base of the skull through the vertex without contrast. Multidetector CT imaging of the neck was performed using the standard protocol without intravenous contrast. COMPARISON:  CT head without contrast 10/27/2015. FINDINGS: CT HEAD FINDINGS Extensive periventricular and subcortical white matter disease is again noted. The basal ganglia are intact. Hypoattenuation within the internal capsule is not significantly changed. The insular ribbon is within normal limits bilaterally. There is  loss of gray-white differentiation in the right PCA distribution suggesting an acute/subacute infarct. No associated hemorrhage is present. Bilateral cerebellar infarcts are also suggested. The fourth ventricle is intact. There is no hydrocephalus. No significant extra-axial fluid collection is present. The paranasal sinuses and mastoid air cells are clear. The calvarium is intact. CT NECK FINDINGS A right-sided catheter is in place. The catheter appears to be extraluminal in the neck to the level of the thoracic inlet. The distal tip may be in the SVC. Diffuse subcutaneous stranding is more prominent right than left. There is asymmetric enlargement of the right sternocleidomastoid muscle and strap muscles. No discrete hyperdense fluid collection is present. Asymmetric subcutaneous edema extends over the right chest. There is no significant fluid collection in the superior mediastinum. The patient is intubated. An NG tube is in place. Atherosclerotic calcifications  are present at the aortic arch and carotid bifurcations. Advanced degenerative changes are present throughout the cervical spine with mild rightward curvature and extensive facet disease. IMPRESSION: 1. 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 2. Otherwise stable extensive white matter disease. 3. The right neck venous catheter appears to be extra luminal at least to the thoracic inlet. 4. Asymmetric subcutaneous and intramuscular edema and possibly hemorrhage within the right neck and chest. The extraluminal position of the catheter may be contributing. 5. No discrete fluid collection or hemorrhage in the neck. 6. Atherosclerosis. These results were called by telephone at the time of interpretation on 12/07/2015 at 10:02 am to Dr. Mortimer Fries , who verbally acknowledged these results. Electronically Signed   By: San Morelle M.D.   On: 12/07/2015 10:04   Ct Soft Tissue Neck Wo  Contrast  12/07/2015  CLINICAL DATA:  Encephalopathy. Soft tissue swelling in the neck. Occlusion of lower extremity graft. EXAM: CT HEAD WITHOUT CONTRAST CT NECK WITHOUT CONTRAST TECHNIQUE: Contiguous axial images were obtained from the base of the skull through the vertex without contrast. Multidetector CT imaging of the neck was performed using the standard protocol without intravenous contrast. COMPARISON:  CT head without contrast 10/27/2015. FINDINGS: CT HEAD FINDINGS Extensive periventricular and subcortical white matter disease is again noted. The basal ganglia are intact. Hypoattenuation within the internal capsule is not significantly changed. The insular ribbon is within normal limits bilaterally. There is loss of gray-white differentiation in the right PCA distribution suggesting an acute/subacute infarct. No associated hemorrhage is present. Bilateral cerebellar infarcts are also suggested. The fourth ventricle is intact. There is no hydrocephalus. No significant extra-axial fluid collection is present. The paranasal sinuses and mastoid air cells are clear. The calvarium is intact. CT NECK FINDINGS A right-sided catheter is in place. The catheter appears to be extraluminal in the neck to the level of the thoracic inlet. The distal tip may be in the SVC. Diffuse subcutaneous stranding is more prominent right than left. There is asymmetric enlargement of the right sternocleidomastoid muscle and strap muscles. No discrete hyperdense fluid collection is present. Asymmetric subcutaneous edema extends over the right chest. There is no significant fluid collection in the superior mediastinum. The patient is intubated. An NG tube is in place. Atherosclerotic calcifications are present at the aortic arch and carotid bifurcations. Advanced degenerative changes are present throughout the cervical spine with mild rightward curvature and extensive facet disease. IMPRESSION: 1. New nonhemorrhagic infarcts involving  the right PCA territory and bilateral cerebellum, left greater than right. 2. Otherwise stable extensive white matter disease. 3. The right neck venous catheter appears to be extra luminal at least to the thoracic inlet. 4. Asymmetric subcutaneous and intramuscular edema and possibly hemorrhage within the right neck and chest. The extraluminal position of the catheter may be contributing. 5. No discrete fluid collection or hemorrhage in the neck. 6. Atherosclerosis. These results were called by telephone at the time of interpretation on 12/07/2015 at 10:02 am to Dr. Mortimer Fries , who verbally acknowledged these results. Electronically Signed   By: San Morelle M.D.   On: 12/07/2015 10:04   Dg Chest Port 1 View  12/06/2015  CLINICAL DATA:  Hypoxia EXAM: PORTABLE CHEST 1 VIEW COMPARISON:  December 02, 2015 FINDINGS: Endotracheal tube tip is 4.8 cm above the carina. Nasogastric tube tip is in the stomach with the side port at the gastroesophageal junction. Central catheter tip is in the superior vena cava.  No pneumothorax. There is scarring in the right and left lung bases. There is postoperative change in the medial right upper lobe region. Lungs elsewhere clear. Heart size and pulmonary vascularity are normal. No adenopathy. No bone lesions. Patient is status post coronary artery bypass grafting. IMPRESSION: Tube and catheter positions as described without pneumothorax. Note that the nasogastric tube side port is at the gastroesophageal junction. Advise advancing nasogastric tube 4-5 cm. There is bibasilar lung scarring with postoperative change in the right upper lobe. No edema or consolidation. No change in cardiac silhouette. These results will be called to the ordering clinician or representative by the Radiologist Assistant, and communication documented in the PACS or zVision Dashboard. Electronically Signed   By: Lowella Grip III M.D.   On: 12/06/2015 13:51   Dg Abd Portable 1v  12/06/2015  CLINICAL  DATA:  Intubation, OG tube placement. History of coronary artery disease, lung cancer, prostate cancer, stroke and diabetes EXAM: PORTABLE ABDOMEN - 1 VIEW COMPARISON:  None. FINDINGS: The OG tube is only partially seen at the upper aspects of this exam, of uncertain position, presumably in the stomach. Bowel gas pattern is nonobstructive. Moderate amount of stool throughout the grossly nondistended colon. Hyperdense material within the right upper quadrant is of uncertain etiology or significance, possibly contrast within the gallbladder. No evidence of free intraperitoneal air seen. Osseous structures are unremarkable. IMPRESSION: 1. OG tube incompletely visualized at the upper aspects of this study. On a chest x-ray performed earlier same day, tip is seen in the stomach but with proximal side holes at the level of the gastroesophageal junction. Recommend advancing for more optimal radiographic positioning. 2. Nonobstructive bowel gas pattern. Fairly large amount of stool within the colon (constipation? ). 3. Hyperdense material within the right upper quadrant, of uncertain etiology or significance, most likely vicarious excretion of contrast within the gallbladder related to patient's recent CT angiogram runoff. Electronically Signed   By: Franki Cabot M.D.   On: 12/06/2015 14:00      Assessment & Plan: Pt is a 70 y.o. male with a PMHx of hypertension, hyperlipidemia, diabetes mellitus, coronary artery disease status post 2 vessel CABG, peripheral arterial disease status post right above-the-knee amputation, seizure disorder, lung cancer, prostate cancer, history of CVA, who was admitted to [facility] on 12/05/2015 for ischemic left lower extremity. Hospital course complicated by acute renal failure, acute respiratory failure, new CVAs, myocardial infarction.  1. Acute renal failure due to contrast exposure/hypotension.   2. Acute respiratory failure. 3. Ischemic left lower extremity 4. Anemia unspecified.   5. Acute myocardial infarction. 6. New CVAs on heat CT.  Plan:  Patient seen at bedside and chart extensively reviewed. The patient is severely ill at this point in time and has evidence for multiorgan failure. This includes new CVA, myocardial infarction, respiratory failure, and now acute renal failure. This places him at further risk for obligation and death. I had a long conversation with the patient's son and daughter. They would like to continue aggressive measures at this point in time. There isn't an urgent indication to perform dialysis however if oliguria persists into tomorrow we could potentially consider CRRT. For now continue supportive care as you are doing.  Check CK to look for rhabdomyolsis. Avoid any further nephrotoxins as possible. If the family does opt for dialysis of temporary hemodialysis catheter could be placed by vascular surgery. Thank you for consultation. Overall prognosis very guarded.

## 2015-12-07 NOTE — Progress Notes (Signed)
Lactic acid of 7.5 called to [place] at [facility]

## 2015-12-07 NOTE — Progress Notes (Signed)
[place] Progress Note Patient Name: Stephen Camacho DOB: 07/30/1946 MRN: [id number]   Date of Service  12/07/2015  HPI/Events of Note  RN notified of Troponin I 22.4 trending up. Patient anemic & receiving PRBC now. Known clot & on ventilator.  eICU Interventions  Continue current plan of care w/ labs 30 minutes after final transfusion.     Intervention Category Major Interventions: Other:  Tera Partridge 12/07/2015, 1:46 AM

## 2015-12-07 NOTE — Progress Notes (Signed)
Case discussed with Dr. Mortimer Fries, we will signoff for now until intubated, once extubated we can be reinvoled in his care.

## 2015-12-07 NOTE — Progress Notes (Signed)
Trop of 2.41 called to [place] at [facility]

## 2015-12-07 NOTE — Progress Notes (Signed)
Pharmacy Antibiotic Note  Stephen Camacho is a 70 y.o. male admitted on 12/05/2015 with ischemic leg.  Pharmacy has been consulted for Vancomycin dosing.   Plan: Patient currently ordered Vancomycin 750 mg IV q18h.  SCr doubled overnight.  Will transition patient to Vancomycin 750 mg IV q36h for estimated trough ~15.  Will need to follow renal function closely for further adjustments.   Continue Zosyn 3.375 g IV q8h per EI protocol.  Height: '6\' 4"'$  (193 cm) Weight: 143 lb 8 oz (65.091 kg) IBW/kg (Calculated) : 86.8  Temp (24hrs), Avg:99 F (37.2 C), Min:96.1 F (35.6 C), Max:100 F (37.8 C)   Recent Labs Lab 12/02/15 1032 12/05/15 0344 12/05/15 1631 12/05/15 1948 12/06/15 0332 12/06/15 1508 12/06/15 2007 12/07/15 0304 12/07/15 0622 12/07/15 0623 12/07/15 0658  WBC 4.6 7.9 16.6* 15.0* 10.0  --  10.5  --   --   --  17.1*  CREATININE 0.88 0.83  --   --  1.45*  --  2.69*  --  2.78*  --   --   LATICACIDVEN  --   --   --   --   --  17.8* 7.5* 3.2*  --  4.8*  --     Estimated Creatinine Clearance: 22.8 mL/min (by C-G formula based on Cr of 2.78).    No Known Allergies  Antimicrobials this admission: Zosyn 2/11 >>  Vancomycin 2/11 >>   Dose adjustments this admission: Dosing changed to q36h based on dramatic increase in SCr overnight.  Patient received dose today at 0548.  Trough rescheduled at 2/15 at 0530.  Microbiology results: No results found for this or any previous visit.  Thank you for allowing pharmacy to be a part of this patient's care.  Murrell Converse, PharmD Clinical Pharmacist 12/07/2015

## 2015-12-08 ENCOUNTER — Encounter: Payer: Self-pay | Admitting: Vascular Surgery

## 2015-12-08 DIAGNOSIS — S1093XA Contusion of unspecified part of neck, initial encounter: Secondary | ICD-10-CM

## 2015-12-08 DIAGNOSIS — J9602 Acute respiratory failure with hypercapnia: Secondary | ICD-10-CM

## 2015-12-08 DIAGNOSIS — J9601 Acute respiratory failure with hypoxia: Secondary | ICD-10-CM

## 2015-12-08 DIAGNOSIS — R404 Transient alteration of awareness: Secondary | ICD-10-CM

## 2015-12-08 DIAGNOSIS — R6520 Severe sepsis without septic shock: Secondary | ICD-10-CM

## 2015-12-08 LAB — BASIC METABOLIC PANEL
ANION GAP: 6 (ref 5–15)
BUN: 40 mg/dL — ABNORMAL HIGH (ref 6–20)
CALCIUM: 7 mg/dL — AB (ref 8.9–10.3)
CO2: 29 mmol/L (ref 22–32)
CREATININE: 3.07 mg/dL — AB (ref 0.61–1.24)
Chloride: 105 mmol/L (ref 101–111)
GFR calc Af Amer: 22 mL/min — ABNORMAL LOW (ref >60)
GFR, EST NON AFRICAN AMERICAN: 19 mL/min — AB (ref >60)
GLUCOSE: 181 mg/dL — AB (ref 65–99)
Potassium: 4 mmol/L (ref 3.5–5.1)
Sodium: 140 mmol/L (ref 135–145)

## 2015-12-08 LAB — PHOSPHORUS: PHOSPHORUS: 4.7 mg/dL — AB (ref 2.5–4.6)

## 2015-12-08 LAB — GLUCOSE, CAPILLARY
GLUCOSE-CAPILLARY: 155 mg/dL — AB (ref 65–99)
GLUCOSE-CAPILLARY: 149 mg/dL — AB (ref 65–99)
GLUCOSE-CAPILLARY: 173 mg/dL — AB (ref 65–99)
Glucose-Capillary: 167 mg/dL — ABNORMAL HIGH (ref 65–99)
Glucose-Capillary: 174 mg/dL — ABNORMAL HIGH (ref 65–99)
Glucose-Capillary: 167 mg/dL — ABNORMAL HIGH (ref 65–99)
Glucose-Capillary: 186 mg/dL — ABNORMAL HIGH (ref 65–99)
Glucose-Capillary: 216 mg/dL — ABNORMAL HIGH (ref 65–99)

## 2015-12-08 LAB — URINE CULTURE: Culture: NO GROWTH

## 2015-12-08 LAB — MAGNESIUM: MAGNESIUM: 1.6 mg/dL — AB (ref 1.7–2.4)

## 2015-12-08 LAB — MRSA PCR SCREENING: MRSA BY PCR: NEGATIVE

## 2015-12-08 MED ORDER — HEPARIN SODIUM (PORCINE) 1000 UNIT/ML DIALYSIS
1000.0000 [IU] | INTRAMUSCULAR | Status: DC | PRN
  Administered 2015-12-08: 3400 [IU] via INTRAVENOUS_CENTRAL
  Filled 2015-12-08: qty 1
  Filled 2015-12-08 (×2): qty 6

## 2015-12-08 MED ORDER — PRO-STAT SUGAR FREE PO LIQD
30.0000 mL | Freq: Two times a day (BID) | ORAL | Status: DC
  Administered 2015-12-08 – 2015-12-09 (×3): 30 mL via ORAL

## 2015-12-08 MED ORDER — FENTANYL 2500MCG IN NS 250ML (10MCG/ML) PREMIX INFUSION: 10.0000 ug/h | INTRAVENOUS | Status: DC

## 2015-12-08 MED ORDER — SODIUM CHLORIDE 0.9 % IV SOLN
INTRAVENOUS | Status: DC
  Administered 2015-12-08: 23:00:00 via INTRAVENOUS
  Filled 2015-12-08: qty 200

## 2015-12-08 MED FILL — Medication: Qty: 1 | Status: AC

## 2015-12-08 NOTE — Care Management (Signed)
Required emergent intubation 2/11 after a vascular procedure due to respiratory failure. Concern for aspiration.  On tube feeds

## 2015-12-08 NOTE — Progress Notes (Signed)
Vas cath placed to right fem groin without complication. Pt tolerated well. Vss.

## 2015-12-08 NOTE — Progress Notes (Signed)
SUBJECTIVE:  The patient is still intubated and sedated. He is off pressors. Renal function is worsening.   Filed Vitals:   12/08/15 0430 12/08/15 0500 12/08/15 0600 12/08/15 0802  BP: 110/72  102/61 97/82  Pulse: 101  104 100  Temp: 100.2 F (37.9 C)   100.4 F (38 C)  TempSrc: Rectal   Rectal  Resp: '23  13 15  '$ Height:      Weight:  163 lb 9.3 oz (74.2 kg)    SpO2: 99%  99% 99%    Intake/Output Summary (Last 24 hours) at 12/08/15 0859 Last data filed at 12/08/15 0802  Gross per 24 hour  Intake 3547.09 ml  Output    700 ml  Net 2847.09 ml    LABS: Basic Metabolic Panel:  Recent Labs  12/07/15 0622 12/08/15 0533  NA 141 140  K 4.0 4.0  CL 105 105  CO2 24 29  GLUCOSE 212* 181*  BUN 35* 40*  CREATININE 2.78* 3.07*  CALCIUM 6.9* 7.0*  MG 1.3* 1.6*  PHOS 4.8* 4.7*   Liver Function Tests: No results for input(s): AST, ALT, ALKPHOS, BILITOT, PROT, ALBUMIN in the last 72 hours. No results for input(s): LIPASE, AMYLASE in the last 72 hours. CBC:  Recent Labs  12/06/15 2007 12/07/15 0658  WBC 10.5 17.1*  HGB 4.8* 8.3*  HCT 15.0* 25.1*  MCV 78.1* 81.8  PLT 85* 76*   Cardiac Enzymes:  Recent Labs  12/06/15 2007 12/07/15 0622 12/07/15 0658 12/07/15 1418  CKTOTAL  --   --   --  104  TROPONINI 22.40* 62.60* 57.81*  --    BNP: Invalid input(s): POCBNP D-Dimer: No results for input(s): DDIMER in the last 72 hours. Hemoglobin A1C:  Recent Labs  12/06/15 0832  HGBA1C 6.4*   Fasting Lipid Panel: No results for input(s): CHOL, HDL, LDLCALC, TRIG, CHOLHDL, LDLDIRECT in the last 72 hours. Thyroid Function Tests: No results for input(s): TSH, T4TOTAL, T3FREE, THYROIDAB in the last 72 hours.  Invalid input(s): FREET3 Anemia Panel: No results for input(s): VITAMINB12, FOLATE, FERRITIN, TIBC, IRON, RETICCTPCT in the last 72 hours.   PHYSICAL EXAM General: Well developed, well nourished, in no acute distress HEENT:  Normocephalic and  atramatic Neck:  No JVD.  Lungs: Clear bilaterally to auscultation and percussion. Heart: HRRR . Normal S1 and S2 without gallops or murmurs.  Abdomen: Bowel sounds are positive, abdomen soft and non-tender  Msk:  Back normal, normal gait. Normal strength and tone for age. Extremities: Extremely cold left leg with no distal pulses. Neuro: Intubated and sedated. Psych:  Not able to evaluate  TELEMETRY: Reviewed telemetry pt in normal sinus rhythm with sinus tachycardia  ASSESSMENT AND PLAN:   70 y.o. 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 He developed septic shock and multiorgan failure after.   1. NSTEMI He does have severe underlying coronary artery disease, history of bypass surgery and stenting  stress test August 2016 with no significant ischemia We will review his echocardiogram. Management options are limited at this point from a cardiac standpoint. Recommend starting low-dose aspirin. Blood  pressure is on the low side and thus a beta blocker was not added. --Would hold on heparin infusion given new strokes seen on CT scan, as well as hemorrhage in the right neck  2.Ischemic left leg Status post endovascular intervention with number lysis.  Left leg below the knee still seems to be ischemic. Not sure if he is stable enough for amputation.    3. New stroke New nonhemorrhagic infarcts involving the right PCA territory and bilateral cerebellum, left greater than right. This possibly indicates embolic phenomenon Echocardiogram pending Unable to fully anticoagulate at this time  secondary to new strokes, hemorrhage in the right neck at the site of line placement   4. DM2: Managed by critical care   5. septic shock  Critical condition yesterday with hypotension, bradycardia, blood loss, respiratory distress  had packed red blood cells, fluid resuscitation, stable blood pressure overnight CT scan concerning for hemorrhage of the right neck  6. Emphysema/COPD Prior history of smoking, bullae seen on CT scan August 2016, worse on the right than the left   7. History of right lung carcinoma s/p lobectomy:  8. Acute renal failure: Nephrology following.   Kathlyn Sacramento, MD, [facility] 12/08/2015 8:59 AM

## 2015-12-08 NOTE — Consult Note (Signed)
PULMONARY / CRITICAL CARE MEDICINE   Name: Stephen Camacho MRN: [id number] DOB: 12-28-1945    ADMISSION DATE:  12/05/2015   CONSULTATION DATE: 12/06/2015   REFERRING MD:  Delana Meyer  CHIEF COMPLAINT: Acute respiratory failure, left fem-pop bypass graft occlusion and SOB  REVIEW OF SYSTEMS:   Unable to obtain, patient is intubated and sedated  SUBJECTIVE:  Worsening creatinine, now requiring dialysis  VITAL SIGNS: BP 105/63 mmHg  Pulse 103  Temp(Src) 100.2 F (37.9 C) (Rectal)  Resp 18  Ht '6\' 4"'$  (1.93 m)  Wt 163 lb 9.3 oz (74.2 kg)  BMI 19.92 kg/m2  SpO2 99%  HEMODYNAMICS:    VENTILATOR SETTINGS: Vent Mode:  [-] PRVC FiO2 (%):  [30 %] 30 % Set Rate:  [13 bmp] 13 bmp Vt Set:  [500 mL] 500 mL PEEP:  [5 cmH20] 5 cmH20  INTAKE / OUTPUT: I/O last 3 completed shifts: In: 6402 [I.V.:5163.9; Blood:562; NG/GT:313.7; IV Piggyback:362.5] Out: 975 [Urine:975]  PHYSICAL EXAMINATION: General: Frail looking male,on vent intubated Neuro: GCs<8T HEENT: PERRLA, Oral mucosa with moderate amount of thick secretions  Cardiovascular: ST, s1/s2, no MRG Lungs:  Labored, bilateral airflow, scattered rhonchi in all lung fields Abdomen: Soft, NT/ND, +BS X4 Musculoskeletal:  Right AKA, left leg with well healed bypass scar Extremities: left leg cold; no palpable pulses, +femoral pulse Skin: Cold; mild discoloration in left leg  LABS:  BMET  Recent Labs Lab 12/06/15 2007 12/07/15 0622 12/08/15 0533  NA 142 141 140  K 4.1 4.0 4.0  CL 108 105 105  CO2 '24 24 29  '$ BUN 31* 35* 40*  CREATININE 2.69* 2.78* 3.07*  GLUCOSE 176* 212* 181*    Electrolytes  Recent Labs Lab 12/06/15 2007 12/07/15 0622 12/08/15 0533  CALCIUM 7.3* 6.9* 7.0*  MG 1.4* 1.3* 1.6*  PHOS 4.0 4.8* 4.7*    CBC  Recent Labs Lab 12/06/15 0332 12/06/15 2007 12/07/15 0658  WBC 10.0 10.5 17.1*  HGB 8.7* 4.8* 8.3*  HCT 27.4* 15.0* 25.1*  PLT 156 85* 76*    Coag's  Recent Labs Lab 12/05/15 0344   12/05/15 2053 12/06/15 0332 12/06/15 0832  APTT 41*  < > 132* 79* 72*  INR 1.99  --   --   --   --   < > = values in this interval not displayed.  Sepsis Markers  Recent Labs Lab 12/06/15 1830  12/07/15 0304 12/07/15 0623 12/07/15 1418  LATICACIDVEN  --   < > 3.2* 4.8* 3.0*  PROCALCITON 0.54  --   --   --   --   < > = values in this interval not displayed.  ABG  Recent Labs Lab 12/06/15 1935 12/06/15 2316 12/07/15 0854  PHART 7.52* 7.43 7.49*  PCO2ART 26* 39 36  PO2ART 168* 173* 179*    Liver Enzymes  Recent Labs Lab 12/05/15 0344  AST 17  ALT 11*  ALKPHOS 83  BILITOT 0.4  ALBUMIN 3.9    Cardiac Enzymes  Recent Labs Lab 12/06/15 2007 12/07/15 0622 12/07/15 0658  TROPONINI 22.40* 62.60* 57.81*    Glucose  Recent Labs Lab 12/08/15 0407 12/08/15 0604 12/08/15 0743 12/08/15 1120 12/08/15 1437 12/08/15 1625  GLUCAP 174* 155* 149* 167* 186* 173*    Imaging No results found.  STUDIES:  02/11: STAT EKG>>  CULTURES: 02/11 Blood x2>> Urine>> Respiratory>>  ANTIBIOTICS: Piperacillin-tazobactam 02/11> Vancomycin 02/11>  SIGNIFICANT EVENTS: 02/07>ED with SOB and left calf pain>treated and released 02/09>ED with cold left foot and calf pain>CT angio>complete occlusion  of the left fem-pop bypass graft>admitted 02/11>OR for angiogram with thrombolysis, thrombectomy of left peroneal artery, tibioperoneal trunk, popliteal artery, and femoral to popliteal bypass graft and percutaneous transluminal angioplasty of the left peroneal artery and distal bypass anastomosis and popliteal artery Post-op: residual thrombosis of femoral to popliteal bypass graft and all tibial vessels; acute respiratory failure>Intubated   LINES/TUBES: Right IJ tlc 02/10> ETT 02/11> L Fem vascath 2/13>>  DISCUSSION: 70 YO male with acute complete occlusion of the  left fem-pop bypass graft s/p thrombolysis/thrombectomy/percuteneous transluminal angioplasty with  residual thrombosis, sepsis secondary to possible necrosis from left leg thrombosis, and  acute hypoxic respiratory failure secondary to sepsis and complicated by acute NSTEMI and worsening renal fcn  ASSESSMENT / PLAN:  PULMONARY A: Acute hypoxic respiratory failure Possible aspiration pneumonia-Copious amount of secretions noted in airway during intubation Severe metabolic acidosis H/O Lung cancer s/p right lung lobectomy P:   -Full vent support-Settings: PRVC 60%/5/20/500 -ABG pending -VAP prevention protocol -F/U CXR as needed -Albuterol and Duoneb prn -Abx as above -F/U cultures -CT neck to assess for hematoma  CARDIOVASCULAR-NSTEMI A:  Bradycardia-Resolved; Now in ST Septic shock H/O hypertension NSTEMI P:  -IV fluid boluses until CVP GE12 -Levophed gtt and titrate to MAP goal >65 -Hemodynamics per ICU protocol -Hold home BP meds  -cardiology following - rec low dose ASA -check ECHO  RENAL A:   AKI secondary procedural IV dye and volume depletion; baseline creatinine 0.83 P:   -IV fluids, bicarb infusion -Trend creatinine -Renally dose meds -nephro following - vascath placed, starting dialysis  GASTROINTESTINAL A:   Constipation P:   -PPI for GI prophylaxis -Dulcolax suppo x 1 tonight -Monitor BMs -cont with TF  HEMATOLOGIC A:   Left leg ischemia-Complete Occlusion of the left fem-pop bypass graft s/p thrombolysis/PTA with residual thrombosis Right neck hematoma s/p TLC placement Severe PVD s/p Right AKA P:  -Vascular surgery following -Anticoagulation per vasc surgery -Monitor left limb for re-thrombosis -Monitor for bleeding-transfuse as needed, -DVT - heparin on hold, due to neck hematoma  INFECTIOUS A:   Septic shock-likely source is left leg ischemia/necrosis from massive thrombus P:   -Antibiotics as above -F/U cultures -IV fluids and pressure to keep MAP>65 -F/U lactic acid level and procalcitonin  ENDOCRINE A:   Type 2 diabetes  mellitus   P:   -Blood glucose monitoring with SSI coverage -Hold oral hypoglycemic agents  NEUROLOGIC A:   Acute metabolic encephalopathy H/o CVA H/O Seizures-Not on home seizure meds P:   RASS goal: -2 -Fentanyl infusion and versed  Prn for vent sedation -Monitor for seizure activity   The Patient requires high complexity decision making for assessment and support, frequent evaluation and titration of therapies, application of advanced monitoring technologies and extensive interpretation of multiple databases.  Critical Care Time devoted to patient care services described in this note is 40 minutes.   Overall, patient is critically ill, prognosis is guarded.  Patient with Multiorgan failure and at high risk for cardiac arrest and death.    Vilinda Boehringer, MD [facility] Pager [phone] (please enter 7-digits) On Call Pager - [phone] (please enter 7-digits)

## 2015-12-08 NOTE — Progress Notes (Signed)
Tube feeds placed on hold per dr Stevenson Clinch r/t pt coughing thick yellow secretions. Vss. Will continue to monitor

## 2015-12-08 NOTE — Progress Notes (Signed)
Pt tube feeds restarted at 40m/hr per order

## 2015-12-08 NOTE — Progress Notes (Signed)
Nutrition Follow-up    INTERVENTION:   EN: per Roselyn Reef RN, TF on hold per MD Mungal due to pt coughing up thick yellow secretions. TF previously infusing at only 20 ml/hr. Per RN, plan is to hold TF for a few hours and if no issues restart. Goal rate of current TF is 60 ml/hr with Prostat BID. Continue to assess   NUTRITION DIAGNOSIS:   Inadequate oral intake related to inability to eat as evidenced by NPO status.  GOAL:   Patient will meet greater than or equal to 90% of their needs  MONITOR:    (Energy Intake, Electrolyte and renal Profile, Anthropometrics, Digestive System, Glucose Profile, Pulmonary Profile)  REASON FOR ASSESSMENT:   Ventilator, Consult Enteral/tube feeding initiation and management  ASSESSMENT:   Per MD note: Pt admitted with acute complete occlusion of the left fem-pop bypass graft s/p thrombolysis/thrombectomy/percuteneous transluminal angioplasty with residual thrombosis, sepsis secondary to possible necrosis from left leg thrombosis, and acute hypoxic respiratory failure secondary to sepsis and complicated by acute NSTEMI.  Pt remains on vent, pt with oliguric renal failure, plan for dialysis catheter placement with initiation of dialysis  EN:  Vital high Protein at rate of 20 ml/hr  Digestive System: last BM 2/12, no signs of TF intolerance   Recent Labs Lab 12/06/15 2007 12/07/15 0622 12/08/15 0533  NA 142 141 140  K 4.1 4.0 4.0  CL 108 105 105  CO2 '24 24 29  '$ BUN 31* 35* 40*  CREATININE 2.69* 2.78* 3.07*  CALCIUM 7.3* 6.9* 7.0*  MG 1.4* 1.3* 1.6*  PHOS 4.0 4.8* 4.7*  GLUCOSE 176* 212* 181*    Glucose Profile:  Recent Labs  12/08/15 0604 12/08/15 0743 12/08/15 1120  GLUCAP 155* 149* 167*   Meds: ss novolog, LR at 100 ml/hr    Height:   Ht Readings from Last 1 Encounters:  12/05/15 '6\' 4"'$  (1.93 m)    Weight:   Wt Readings from Last 1 Encounters:  12/08/15 163 lb 9.3 oz (74.2 kg)   Filed Weights   12/05/15 0645  12/08/15 0500  Weight: 143 lb 8 oz (65.091 kg) 163 lb 9.3 oz (74.2 kg)    BMI:  Body mass index is 19.92 kg/(m^2).  Estimated Nutritional Needs:   Kcal:  1738kcals, using [employer] Equation with ABW of 65kg, (BEE: 1506kcals, Temp: 37.7, Ve: 6.5)  Protein:  136-182g protein (1.5-2.0g/kg) using IBW of 91kg  Fluid:  2275-2770m of fluid (25-320mkg) using IBW of 91kg  HIGH Care Level  CaBorgWarnerS, RD, LDN [phone]  [phone]/On-Call Pager

## 2015-12-08 NOTE — Progress Notes (Signed)
Chaplain rounded in the unit and provided a compassionate presence to the patient an said a silent prayer. Stephen Camacho [phone]

## 2015-12-08 NOTE — Progress Notes (Signed)
Pharmacy Antibiotic Note  Stephen Camacho is a 70 y.o. male admitted on 12/05/2015 with ischemic leg.  Pharmacy has been consulted for Vancomycin dosing.   Plan: Will continue patient on  Vancomycin 750 mg IV q36h. Will follow renal function and adjust as appropriate.   Continue Zosyn 3.375 g IV q8h per EI protocol.  Height: '6\' 4"'$  (193 cm) Weight: 163 lb 9.3 oz (74.2 kg) IBW/kg (Calculated) : 86.8  Temp (24hrs), Avg:100.5 F (38.1 C), Min:99.7 F (37.6 C), Max:101.1 F (38.4 C)   Recent Labs Lab 12/05/15 0344 12/05/15 1631 12/05/15 1948 12/06/15 0332 12/06/15 1508 12/06/15 2007 12/07/15 0304 12/07/15 0622 12/07/15 0623 12/07/15 0658 12/07/15 1418 12/08/15 0533  WBC 7.9 16.6* 15.0* 10.0  --  10.5  --   --   --  17.1*  --   --   CREATININE 0.83  --   --  1.45*  --  2.69*  --  2.78*  --   --   --  3.07*  LATICACIDVEN  --   --   --   --  17.8* 7.5* 3.2*  --  4.8*  --  3.0*  --     Estimated Creatinine Clearance: 23.5 mL/min (by C-G formula based on Cr of 3.07).    No Known Allergies  Antimicrobials this admission: Zosyn 2/11 >>  Vancomycin 2/11 >>   Dose adjustments this admission: Vancomycin transitioned to vancomycin q36hr. Trough rescheduled at 2/15 at 0530.  Microbiology results: Results for orders placed or performed during the hospital encounter of 12/05/15  Culture, respiratory (NON-Expectorated)     Status: None (Preliminary result)   Collection Time: 12/06/15  4:05 PM  Result Value Ref Range Status   Specimen Description NASAL SWAB  Final   Special Requests NONE  Final   Gram Stain RARE WBC SEEN FEW GRAM POSITIVE COCCI   Final   Culture HOLDING FOR POSSIBLE PATHOGEN  Final   Report Status PENDING  Incomplete  Culture, blood (Routine X 2) w Reflex to ID Panel     Status: None (Preliminary result)   Collection Time: 12/07/15 12:09 AM  Result Value Ref Range Status   Specimen Description BLOOD BLOOD RIGHT HAND  Final   Special Requests BOTTLES DRAWN  AEROBIC AND ANAEROBIC 10CC  Final   Culture NO GROWTH 1 DAY  Final   Report Status PENDING  Incomplete  Culture, blood (Routine X 2) w Reflex to ID Panel     Status: None (Preliminary result)   Collection Time: 12/07/15 12:09 AM  Result Value Ref Range Status   Specimen Description BLOOD BLOOD LEFT HAND  Final   Special Requests BOTTLES DRAWN AEROBIC AND ANAEROBIC 10CC  Final   Culture NO GROWTH 1 DAY  Final   Report Status PENDING  Incomplete  Urine culture     Status: None   Collection Time: 12/07/15 12:11 AM  Result Value Ref Range Status   Specimen Description URINE, CLEAN CATCH  Final   Special Requests NONE  Final   Culture NO GROWTH 1 DAY  Final   Report Status 12/08/2015 FINAL  Final  MRSA PCR Screening     Status: None   Collection Time: 12/08/15  9:12 AM  Result Value Ref Range Status   MRSA by PCR NEGATIVE NEGATIVE Final    Comment:        The GeneXpert MRSA Assay (FDA approved for NASAL specimens only), is one component of a comprehensive MRSA colonization surveillance program. It is not intended to  diagnose MRSA infection nor to guide or monitor treatment for MRSA infections.     Pharmacy will continue to monitor and adjust per consult.  Currie Paris, PharmD 12/08/2015

## 2015-12-08 NOTE — Progress Notes (Signed)
[facility]  Daily Progress Note   Subjective  - 2 Day Post-Op  Intubated and sedated with severe acidosis Saturday  Continues to be hypotensive, off pressors now and BP better Critically ill  Objective Filed Vitals:   12/08/15 1300 12/08/15 1400 12/08/15 1500 12/08/15 1600  BP: 1'11/62 91/57 87/54 '$ 112/62  Pulse: 115 108 104 112  Temp: 100.9 F (38.3 C) 100.9 F (38.3 C) 100.6 F (38.1 C) 100.4 F (38 C)  TempSrc:      Resp: '18 13 13 15  '$ Height:      Weight:      SpO2: 98% 99% 99% 93%    Intake/Output Summary (Last 24 hours) at 12/08/15 1726 Last data filed at 12/08/15 1658  Gross per 24 hour  Intake 4850.41 ml  Output   1575 ml  Net 3275.41 ml    PULM  CTAB CV  RRR VASC  Left leg cool to the calf, no pulses present  Laboratory CBC    Component Value Date/Time   WBC 17.1* 12/07/2015 0658   WBC 4.9 10/25/2014 1331   HGB 8.3* 12/07/2015 0658   HGB 12.1* 10/25/2014 1331   HCT 25.1* 12/07/2015 0658   HCT 38.1* 10/25/2014 1331   PLT 76* 12/07/2015 0658   PLT 151 10/25/2014 1331    BMET    Component Value Date/Time   NA 140 12/08/2015 0533   NA 138 10/25/2014 1331   K 4.0 12/08/2015 0533   K 3.9 10/25/2014 1331   CL 105 12/08/2015 0533   CL 109* 10/25/2014 1331   CO2 29 12/08/2015 0533   CO2 21 10/25/2014 1331   GLUCOSE 181* 12/08/2015 0533   GLUCOSE 232* 10/25/2014 1331   BUN 40* 12/08/2015 0533   BUN 17 10/25/2014 1331   CREATININE 3.07* 12/08/2015 0533   CREATININE 1.11 10/25/2014 1331   CALCIUM 7.0* 12/08/2015 0533   CALCIUM 8.7 10/25/2014 1331   GFRNONAA 19* 12/08/2015 0533   GFRNONAA >60 10/25/2014 1331   GFRNONAA 52* 07/08/2014 1051   GFRAA 22* 12/08/2015 0533   GFRAA >60 10/25/2014 1331   GFRAA >60 07/08/2014 1051    Assessment/Planning: POD #2/3 s/p thrombolysis and thrombectomy for ischemic leg   Now with acute MI  Hypotension on pressors (now off), bypass likely reoccluded and little we can do at this  point to salvage  Bypass is likely rethrombosed secondary to MI and hemodynamic instabilityCardiology to see  Head CT - for intracranial hemorrhage, subacute infarcts noted in the cerebellar area  Possible aspiration pneumonia. On abx  Critically ill.  Appreciate critical care input  [name]  12/08/2015, 5:26 PM

## 2015-12-08 NOTE — Progress Notes (Signed)
[facility]  ROUNDING NOTE   Subjective:     Objective:  Vital signs in last 24 hours:  Temp:  [98.6 F (37 C)-100.6 F (38.1 C)] 100.4 F (38 C) (02/13 0802) Pulse Rate:  [95-109] 100 (02/13 0802) Resp:  [10-23] 15 (02/13 0802) BP: (91-116)/(60-82) 97/82 mmHg (02/13 0802) SpO2:  [98 %-100 %] 99 % (02/13 0802) FiO2 (%):  [30 %-40 %] 30 % (02/13 0802) Weight:  [74.2 kg (163 lb 9.3 oz)] 74.2 kg (163 lb 9.3 oz) (02/13 0500)  Weight change:  Filed Weights   12/05/15 0645 12/08/15 0500  Weight: 65.091 kg (143 lb 8 oz) 74.2 kg (163 lb 9.3 oz)    Intake/Output: I/O last 3 completed shifts: In: 6402 [I.V.:5163.9; Blood:562; NG/GT:313.7; IV Piggyback:362.5] Out: 975 [Urine:975]   Intake/Output this shift:  Total I/O In: 284.7 [I.V.:244; NG/GT:40.7] Out: 125 [Urine:125]  Physical Exam: General: Critically ill  Head: Eyes closed, ETT, NGT  Eyes: Eyes closed  Neck: Right central line IJ  Lungs:  Bilateral diminished breath sounds, PRVC 30%  Heart: Sinus to sinus tach  Abdomen:  Soft, nontender  Extremities:  Right AKA, left lower extremity with cyanosis and diminished pulses  Neurologic: Intubated and sedated  Skin: No lesions  Access: none    Basic Metabolic Panel:  Recent Labs Lab 12/05/15 0344 12/06/15 0332 12/06/15 2007 12/07/15 0622 12/08/15 0533  NA 137 137 142 141 140  K 4.1 4.5 4.1 4.0 4.0  CL 107 104 108 105 105  CO2 '24 23 24 24 29  '$ GLUCOSE 133* 447* 176* 212* 181*  BUN 15 18 31* 35* 40*  CREATININE 0.83 1.45* 2.69* 2.78* 3.07*  CALCIUM 9.1 8.3* 7.3* 6.9* 7.0*  MG  --   --  1.4* 1.3* 1.6*  PHOS  --   --  4.0 4.8* 4.7*    Liver Function Tests:  Recent Labs Lab 12/05/15 0344  AST 17  ALT 11*  ALKPHOS 83  BILITOT 0.4  PROT 8.0  ALBUMIN 3.9   No results for input(s): LIPASE, AMYLASE in the last 168 hours. No results for input(s): AMMONIA in the last 168 hours.  CBC:  Recent Labs Lab 12/05/15 1631 12/05/15 1948  12/06/15 0332 12/06/15 2007 12/07/15 0658  WBC 16.6* 15.0* 10.0 10.5 17.1*  HGB 10.9* 10.2* 8.7* 4.8* 8.3*  HCT 34.0* 32.8* 27.4* 15.0* 25.1*  MCV 77.9* 79.7* 79.6* 78.1* 81.8  PLT 195 164 156 85* 76*    Cardiac Enzymes:  Recent Labs Lab 12/02/15 1339 12/06/15 1647 12/06/15 2007 12/07/15 0622 12/07/15 0658 12/07/15 1418  CKTOTAL  --   --   --   --   --  104  TROPONINI <0.03 2.41* 22.40* 62.60* 57.81*  --     BNP: Invalid input(s): POCBNP  CBG:  Recent Labs Lab 12/07/15 2235 12/08/15 0148 12/08/15 0407 12/08/15 0604 12/08/15 0743  GLUCAP 163* 167* 174* 155* 149*    Microbiology: Results for orders placed or performed during the hospital encounter of 12/05/15  Culture, respiratory (NON-Expectorated)     Status: None (Preliminary result)   Collection Time: 12/06/15  4:05 PM  Result Value Ref Range Status   Specimen Description NASAL SWAB  Final   Special Requests NONE  Final   Gram Stain RARE WBC SEEN FEW GRAM POSITIVE COCCI   Final   Culture PENDING  Incomplete   Report Status PENDING  Incomplete  Culture, blood (Routine X 2) w Reflex to ID Panel     Status:  None (Preliminary result)   Collection Time: 12/07/15 12:09 AM  Result Value Ref Range Status   Specimen Description BLOOD BLOOD RIGHT HAND  Final   Special Requests BOTTLES DRAWN AEROBIC AND ANAEROBIC 10CC  Final   Culture NO GROWTH 1 DAY  Final   Report Status PENDING  Incomplete  Culture, blood (Routine X 2) w Reflex to ID Panel     Status: None (Preliminary result)   Collection Time: 12/07/15 12:09 AM  Result Value Ref Range Status   Specimen Description BLOOD BLOOD LEFT HAND  Final   Special Requests BOTTLES DRAWN AEROBIC AND ANAEROBIC 10CC  Final   Culture NO GROWTH 1 DAY  Final   Report Status PENDING  Incomplete    Coagulation Studies: No results for input(s): LABPROT, INR in the last 72 hours.  Urinalysis: No results for input(s): COLORURINE, LABSPEC, PHURINE, GLUCOSEU, HGBUR,  BILIRUBINUR, KETONESUR, PROTEINUR, UROBILINOGEN, NITRITE, LEUKOCYTESUR in the last 72 hours.  Invalid input(s): APPERANCEUR    Imaging: Dg Chest 1 View  12/07/2015  CLINICAL DATA:  Hypoxia EXAM: CHEST 1 VIEW COMPARISON:  December 06, 2015 FINDINGS: Endotracheal tube tip is 4.1 cm above the carina. Nasogastric tube tip and side port are in the stomach. Central catheter tip is in the superior vena cava. No pneumothorax. There is postoperative change in the medial aspect of the left upper lobe. There is also postoperative change in the medial right upper lobe. There is mild scarring in the right base. There is no edema or consolidation. The heart size and pulmonary vascularity are within normal limits. No adenopathy. IMPRESSION: Postoperative change in both upper lobes. No edema or consolidation. Tube and catheter positions as described without pneumothorax. Electronically Signed   By: Lowella Grip III M.D.   On: 12/07/2015 08:39   Ct Head Wo Contrast  12/07/2015  CLINICAL DATA:  Encephalopathy. Soft tissue swelling in the neck. Occlusion of lower extremity graft. EXAM: CT HEAD WITHOUT CONTRAST CT NECK WITHOUT CONTRAST TECHNIQUE: Contiguous axial images were obtained from the base of the skull through the vertex without contrast. Multidetector CT imaging of the neck was performed using the standard protocol without intravenous contrast. COMPARISON:  CT head without contrast 10/27/2015. FINDINGS: CT HEAD FINDINGS Extensive periventricular and subcortical white matter disease is again noted. The basal ganglia are intact. Hypoattenuation within the internal capsule is not significantly changed. The insular ribbon is within normal limits bilaterally. There is loss of gray-white differentiation in the right PCA distribution suggesting an acute/subacute infarct. No associated hemorrhage is present. Bilateral cerebellar infarcts are also suggested. The fourth ventricle is intact. There is no hydrocephalus. No  significant extra-axial fluid collection is present. The paranasal sinuses and mastoid air cells are clear. The calvarium is intact. CT NECK FINDINGS A right-sided catheter is in place. The catheter appears to be extraluminal in the neck to the level of the thoracic inlet. The distal tip may be in the SVC. Diffuse subcutaneous stranding is more prominent right than left. There is asymmetric enlargement of the right sternocleidomastoid muscle and strap muscles. No discrete hyperdense fluid collection is present. Asymmetric subcutaneous edema extends over the right chest. There is no significant fluid collection in the superior mediastinum. The patient is intubated. An NG tube is in place. Atherosclerotic calcifications are present at the aortic arch and carotid bifurcations. Advanced degenerative changes are present throughout the cervical spine with mild rightward curvature and extensive facet disease. IMPRESSION: 1. New nonhemorrhagic infarcts involving the right PCA territory and bilateral cerebellum,  left greater than right. 2. Otherwise stable extensive white matter disease. 3. The right neck venous catheter appears to be extra luminal at least to the thoracic inlet. 4. Asymmetric subcutaneous and intramuscular edema and possibly hemorrhage within the right neck and chest. The extraluminal position of the catheter may be contributing. 5. No discrete fluid collection or hemorrhage in the neck. 6. Atherosclerosis. These results were called by telephone at the time of interpretation on 12/07/2015 at 10:02 am to Dr. Mortimer Fries , who verbally acknowledged these results. Electronically Signed   By: San Morelle M.D.   On: 12/07/2015 10:04   Ct Soft Tissue Neck Wo Contrast  12/07/2015  CLINICAL DATA:  Encephalopathy. Soft tissue swelling in the neck. Occlusion of lower extremity graft. EXAM: CT HEAD WITHOUT CONTRAST CT NECK WITHOUT CONTRAST TECHNIQUE: Contiguous axial images were obtained from the base of the skull  through the vertex without contrast. Multidetector CT imaging of the neck was performed using the standard protocol without intravenous contrast. COMPARISON:  CT head without contrast 10/27/2015. FINDINGS: CT HEAD FINDINGS Extensive periventricular and subcortical white matter disease is again noted. The basal ganglia are intact. Hypoattenuation within the internal capsule is not significantly changed. The insular ribbon is within normal limits bilaterally. There is loss of gray-white differentiation in the right PCA distribution suggesting an acute/subacute infarct. No associated hemorrhage is present. Bilateral cerebellar infarcts are also suggested. The fourth ventricle is intact. There is no hydrocephalus. No significant extra-axial fluid collection is present. The paranasal sinuses and mastoid air cells are clear. The calvarium is intact. CT NECK FINDINGS A right-sided catheter is in place. The catheter appears to be extraluminal in the neck to the level of the thoracic inlet. The distal tip may be in the SVC. Diffuse subcutaneous stranding is more prominent right than left. There is asymmetric enlargement of the right sternocleidomastoid muscle and strap muscles. No discrete hyperdense fluid collection is present. Asymmetric subcutaneous edema extends over the right chest. There is no significant fluid collection in the superior mediastinum. The patient is intubated. An NG tube is in place. Atherosclerotic calcifications are present at the aortic arch and carotid bifurcations. Advanced degenerative changes are present throughout the cervical spine with mild rightward curvature and extensive facet disease. IMPRESSION: 1. 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 2. Otherwise stable extensive white matter disease. 3. The right neck venous catheter appears to be extra luminal at least to the thoracic inlet. 4. Asymmetric subcutaneous and  intramuscular edema and possibly hemorrhage within the right neck and chest. The extraluminal position of the catheter may be contributing. 5. No discrete fluid collection or hemorrhage in the neck. 6. Atherosclerosis. These results were called by telephone at the time of interpretation on 12/07/2015 at 10:02 am to Dr. Mortimer Fries , who verbally acknowledged these results. Electronically Signed   By: San Morelle M.D.   On: 12/07/2015 10:04   Dg Chest Port 1 View  12/06/2015  CLINICAL DATA:  Hypoxia EXAM: PORTABLE CHEST 1 VIEW COMPARISON:  December 02, 2015 FINDINGS: Endotracheal tube tip is 4.8 cm above the carina. Nasogastric tube tip is in the stomach with the side port at the gastroesophageal junction. Central catheter tip is in the superior vena cava. No pneumothorax. There is scarring in the right and left lung bases. There is postoperative change in the medial right upper lobe region. Lungs elsewhere clear. Heart size and pulmonary vascularity are normal. No adenopathy. No bone lesions. Patient  is status post coronary artery bypass grafting. IMPRESSION: Tube and catheter positions as described without pneumothorax. Note that the nasogastric tube side port is at the gastroesophageal junction. Advise advancing nasogastric tube 4-5 cm. There is bibasilar lung scarring with postoperative change in the right upper lobe. No edema or consolidation. No change in cardiac silhouette. These results will be called to the ordering clinician or representative by the Radiologist Assistant, and communication documented in the PACS or zVision Dashboard. Electronically Signed   By: Lowella Grip III M.D.   On: 12/06/2015 13:51   Dg Abd Portable 1v  12/06/2015  CLINICAL DATA:  Intubation, OG tube placement. History of coronary artery disease, lung cancer, prostate cancer, stroke and diabetes EXAM: PORTABLE ABDOMEN - 1 VIEW COMPARISON:  None. FINDINGS: The OG tube is only partially seen at the upper aspects of this  exam, of uncertain position, presumably in the stomach. Bowel gas pattern is nonobstructive. Moderate amount of stool throughout the grossly nondistended colon. Hyperdense material within the right upper quadrant is of uncertain etiology or significance, possibly contrast within the gallbladder. No evidence of free intraperitoneal air seen. Osseous structures are unremarkable. IMPRESSION: 1. OG tube incompletely visualized at the upper aspects of this study. On a chest x-ray performed earlier same day, tip is seen in the stomach but with proximal side holes at the level of the gastroesophageal junction. Recommend advancing for more optimal radiographic positioning. 2. Nonobstructive bowel gas pattern. Fairly large amount of stool within the colon (constipation? ). 3. Hyperdense material within the right upper quadrant, of uncertain etiology or significance, most likely vicarious excretion of contrast within the gallbladder related to patient's recent CT angiogram runoff. Electronically Signed   By: Franki Cabot M.D.   On: 12/06/2015 14:00     Medications:   . fentaNYL infusion INTRAVENOUS 200 mcg/hr (12/07/15 2032)  . lactated ringers 100 mL/hr at 12/08/15 0204  . nitroGLYCERIN    . norepinephrine (LEVOPHED) Adult infusion 1 mcg/min (12/06/15 2312)   . antiseptic oral rinse  7 mL Mouth Rinse QID  . bisacodyl  10 mg Rectal Once  . chlorhexidine gluconate  15 mL Mouth Rinse BID  . feeding supplement (VITAL HIGH PROTEIN)  1,000 mL Per Tube Q24H  . insulin aspart  0-15 Units Subcutaneous Q4H  . insulin regular  8 Units Subcutaneous Once  . ipratropium-albuterol  3 mL Nebulization Q6H  . pantoprazole (PROTONIX) IV  40 mg Intravenous Daily  . piperacillin-tazobactam (ZOSYN)  IV  3.375 g Intravenous 3 times per day  . sodium chloride flush  3 mL Intravenous Q12H  . vancomycin  750 mg Intravenous Q36H   acetaminophen **OR** [DISCONTINUED] acetaminophen, albuterol, bisacodyl, fentaNYL (SUBLIMAZE)  injection, hydrALAZINE, labetalol, metoprolol, midazolam, ondansetron **OR** ondansetron (ZOFRAN) IV, sennosides, sodium chloride flush  Assessment/ Plan:  Stephen Camacho is a 70 y.o. black  male with hypertension, hyperlipidemia, diabetes mellitus type II, coronary artery disease status post 2 vessel CABG, peripheral arterial disease status post right above-the-knee amputation, seizure disorder, lung cancer, prostate cancer, history of CVA, who was admitted to [facility] on 12/05/2015 for ischemic left lower extremity. Hospital course complicated by acute renal failure, acute respiratory failure, new CVAs, myocardial infarction.  1. Acute renal failure:  ATN from contrast exposure. Oliguric urine output. Worsening renal function. Now with hypotension. Baseline creatinine of 0.83.  Electrolytes currently at goal.  - Will need hemodialysis if urine output does not improve.  - Renally dose all medications - Will have dialysis catheter placed.  Will discuss dialysis with family.   2. Acute respiratory failure: Status post intubation and on mechanical ventilation. With acute myocardial infarction - echocardiogram pending.  - Appreciate cardiology and pulmonary input.   3. Ischemic left lower extremity: peripheral vascular disease: and now with acute DVT.  - Appreciate vascular input.   4. Anemia with renal failure: hemoglobin of 4.8 on 2/11. Status post PRBC transfusion. Now hemoglobin 8.7   LOS: 3 Tahani Potier 2/13/20179:00 AM

## 2015-12-08 NOTE — Procedures (Signed)
  Procedure Note: VasCath Placement-R Femoral Vein Johnnette Litter , [id number] , IC19A/IC19A-AA  Indications: Hemodynamic monitoring / Intravenous access  Benefits, risks (including bleeding, infection,  Injury, etc.), and alternatives explained to HCPOA who voiced understanding.  Questions were sought and answered.   HCPOA agreed to proceed with the procedure.  Consent is signed and on chart. A time-out was completed verifying correct patient, procedure and site.  A 2 lumen catheter available at the time of procedure.  The patient was placed in a dependent position appropriate for Vascath placement based on the vein to be cannulated.   The patient's RIGHT Femoral Vein was prepped and draped in a sterile fashion.  1% Lidocaine WAS NOT used to anesthetize the surrounding skin area.   A 2 lumen catheter was introduced into the RIGHT Femoral Vein using Seldinger technique, visualized under ultrasound.  The catheter was threaded smoothly over the guide wire and appropriate blood return was obtained.  Each lumen of the catheter was evacuated of air and flushed with sterile saline.  The catheter was then sutured in place to the skin and a sterile dressing applied.  Perfusion to the extremity distal to the point of catheter insertion was checked and found to be adequate.    The patient tolerated the procedure well and there were no complications.  Vilinda Boehringer, MD [facility] Pager [phone] (please enter 7-digits) On Call Pager - [phone] (please enter 7-digits)

## 2015-12-09 ENCOUNTER — Ambulatory Visit: Payer: PPO | Admitting: Physical Therapy

## 2015-12-09 DIAGNOSIS — S1093XD Contusion of unspecified part of neck, subsequent encounter: Secondary | ICD-10-CM

## 2015-12-09 DIAGNOSIS — D62 Acute posthemorrhagic anemia: Secondary | ICD-10-CM

## 2015-12-09 LAB — BASIC METABOLIC PANEL
Anion gap: 4 — ABNORMAL LOW (ref 5–15)
BUN: 36 mg/dL — ABNORMAL HIGH (ref 6–20)
CALCIUM: 7 mg/dL — AB (ref 8.9–10.3)
CHLORIDE: 106 mmol/L (ref 101–111)
CO2: 30 mmol/L (ref 22–32)
CREATININE: 2.73 mg/dL — AB (ref 0.61–1.24)
GFR calc non Af Amer: 22 mL/min — ABNORMAL LOW (ref >60)
GFR, EST AFRICAN AMERICAN: 26 mL/min — AB (ref >60)
GLUCOSE: 163 mg/dL — AB (ref 65–99)
Potassium: 4.1 mmol/L (ref 3.5–5.1)
Sodium: 140 mmol/L (ref 135–145)

## 2015-12-09 LAB — CBC
HEMATOCRIT: 19.8 % — AB (ref 40.0–52.0)
HEMOGLOBIN: 6.3 g/dL — AB (ref 13.0–18.0)
MCH: 26.8 pg (ref 26.0–34.0)
MCHC: 32.1 g/dL (ref 32.0–36.0)
MCV: 83.4 fL (ref 80.0–100.0)
Platelets: 90 10*3/uL — ABNORMAL LOW (ref 150–440)
RBC: 2.37 MIL/uL — ABNORMAL LOW (ref 4.40–5.90)
RDW: 16.1 % — ABNORMAL HIGH (ref 11.5–14.5)
WBC: 16.9 10*3/uL — AB (ref 3.8–10.6)

## 2015-12-09 LAB — CBC WITH DIFFERENTIAL/PLATELET
Basophils Absolute: 0 10*3/uL (ref 0–0.1)
Eosinophils Absolute: 0 10*3/uL (ref 0–0.7)
HEMATOCRIT: 25 % — AB (ref 40.0–52.0)
Hemoglobin: 8.2 g/dL — ABNORMAL LOW (ref 13.0–18.0)
Lymphocytes Relative: 5 %
Lymphs Abs: 0.8 10*3/uL — ABNORMAL LOW (ref 1.0–3.6)
MCH: 27.7 pg (ref 26.0–34.0)
MCHC: 32.9 g/dL (ref 32.0–36.0)
MCV: 84.1 fL (ref 80.0–100.0)
MONO ABS: 1.5 10*3/uL — AB (ref 0.2–1.0)
NEUTROS ABS: 16.2 10*3/uL — AB (ref 1.4–6.5)
Neutrophils Relative %: 87 %
PLATELETS: 86 10*3/uL — AB (ref 150–440)
RBC: 2.97 MIL/uL — ABNORMAL LOW (ref 4.40–5.90)
RDW: 15.4 % — AB (ref 11.5–14.5)
WBC: 18.5 10*3/uL — ABNORMAL HIGH (ref 3.8–10.6)

## 2015-12-09 LAB — GLUCOSE, CAPILLARY
GLUCOSE-CAPILLARY: 154 mg/dL — AB (ref 65–99)
GLUCOSE-CAPILLARY: 137 mg/dL — AB (ref 65–99)
GLUCOSE-CAPILLARY: 164 mg/dL — AB (ref 65–99)
GLUCOSE-CAPILLARY: 209 mg/dL — AB (ref 65–99)
GLUCOSE-CAPILLARY: 135 mg/dL — AB (ref 65–99)
Glucose-Capillary: 133 mg/dL — ABNORMAL HIGH (ref 65–99)
Glucose-Capillary: 153 mg/dL — ABNORMAL HIGH (ref 65–99)

## 2015-12-09 LAB — CULTURE, RESPIRATORY

## 2015-12-09 LAB — RENAL FUNCTION PANEL
ALBUMIN: 1.9 g/dL — AB (ref 3.5–5.0)
ANION GAP: 4 — AB (ref 5–15)
BUN: 38 mg/dL — ABNORMAL HIGH (ref 6–20)
CO2: 31 mmol/L (ref 22–32)
Calcium: 7.1 mg/dL — ABNORMAL LOW (ref 8.9–10.3)
Chloride: 105 mmol/L (ref 101–111)
Creatinine, Ser: 2.66 mg/dL — ABNORMAL HIGH (ref 0.61–1.24)
GFR calc Af Amer: 26 mL/min — ABNORMAL LOW (ref >60)
GFR, EST NON AFRICAN AMERICAN: 23 mL/min — AB (ref >60)
Glucose, Bld: 180 mg/dL — ABNORMAL HIGH (ref 65–99)
PHOSPHORUS: 4.3 mg/dL (ref 2.5–4.6)
POTASSIUM: 4.1 mmol/L (ref 3.5–5.1)
Sodium: 140 mmol/L (ref 135–145)

## 2015-12-09 LAB — CULTURE, RESPIRATORY W GRAM STAIN

## 2015-12-09 LAB — PREPARE RBC (CROSSMATCH)

## 2015-12-09 MED ORDER — VANCOMYCIN HCL IN DEXTROSE 750-5 MG/150ML-% IV SOLN
750.0000 mg | INTRAVENOUS | Status: DC
  Administered 2015-12-10: 750 mg via INTRAVENOUS
  Filled 2015-12-09: qty 150

## 2015-12-09 MED ORDER — SODIUM CHLORIDE 0.9 % IV SOLN: Freq: Once | INTRAVENOUS | Status: DC

## 2015-12-09 MED ORDER — PRO-STAT SUGAR FREE PO LIQD
30.0000 mL | Freq: Four times a day (QID) | ORAL | Status: DC
  Administered 2015-12-09 – 2015-12-13 (×15): 30 mL

## 2015-12-09 MED ORDER — FENTANYL BOLUS VIA INFUSION: 25.0000 ug | INTRAVENOUS | Status: DC | PRN

## 2015-12-09 MED ORDER — FENTANYL CITRATE (PF) 100 MCG/2ML IJ SOLN
50.0000 ug | INTRAMUSCULAR | Status: DC | PRN
  Administered 2015-12-11 (×2): 50 ug via INTRAVENOUS

## 2015-12-09 MED ORDER — FENTANYL 2500MCG IN NS 250ML (10MCG/ML) PREMIX INFUSION
25.0000 ug/h | INTRAVENOUS | Status: DC
Start: 2015-12-09 — End: 2015-12-09

## 2015-12-09 MED ORDER — VITAL AF 1.2 CAL PO LIQD
1000.0000 mL | ORAL | Status: DC
  Administered 2015-12-09 – 2015-12-13 (×3): 1000 mL

## 2015-12-09 MED ORDER — FENTANYL 2500MCG IN NS 250ML (10MCG/ML) PREMIX INFUSION
25.0000 ug/h | INTRAVENOUS | Status: DC
  Administered 2015-12-10: 25 ug/h via INTRAVENOUS
  Administered 2015-12-10: 250 ug/h via INTRAVENOUS
  Administered 2015-12-10: 225 ug/h via INTRAVENOUS
  Administered 2015-12-11 – 2015-12-13 (×6): 300 ug/h via INTRAVENOUS
  Filled 2015-12-09 (×10): qty 250

## 2015-12-09 MED ORDER — SODIUM CHLORIDE 0.9 % IV SOLN
25.0000 ug/h | INTRAVENOUS | Status: DC
  Administered 2015-12-09: 200 ug/h via INTRAVENOUS
  Filled 2015-12-09 (×2): qty 50

## 2015-12-09 MED ORDER — FENTANYL CITRATE (PF) 100 MCG/2ML IJ SOLN: 50.0000 ug | INTRAMUSCULAR | Status: DC | PRN

## 2015-12-09 MED ORDER — METOPROLOL TARTRATE 25 MG PO TABS
12.5000 mg | ORAL_TABLET | Freq: Two times a day (BID) | ORAL | Status: DC
  Administered 2015-12-09 – 2015-12-11 (×5): 12.5 mg
  Filled 2015-12-09 (×5): qty 1

## 2015-12-09 MED ORDER — FENTANYL CITRATE (PF) 100 MCG/2ML IJ SOLN: 50.0000 ug | Freq: Once | INTRAMUSCULAR | Status: DC

## 2015-12-09 NOTE — Consult Note (Signed)
PULMONARY / CRITICAL CARE MEDICINE   Name: Stephen Camacho MRN: [id number] DOB: 1946/09/15    ADMISSION DATE:  12/05/2015   CONSULTATION DATE: 12/06/2015   REFERRING MD:  Delana Meyer  CHIEF COMPLAINT: Acute respiratory failure, left fem-pop bypass graft occlusion and SOB  REVIEW OF SYSTEMS:   Unable to obtain, patient is intubated and sedated  SUBJECTIVE:  Off levophed, started having increase UOP, and did not require any dialysis. Now with excessive UOP, possible post ATN diuresis  VITAL SIGNS: BP 111/64 mmHg  Pulse 86  Temp(Src) 100 F (37.8 C) (Rectal)  Resp 15  Ht '6\' 4"'$  (1.93 m)  Wt 171 lb 4.8 oz (77.7 kg)  BMI 20.86 kg/m2  SpO2 100%  HEMODYNAMICS:    VENTILATOR SETTINGS: Vent Mode:  [-] PRVC FiO2 (%):  [30 %] 30 % Set Rate:  [13 bmp] 13 bmp Vt Set:  [500 mL] 500 mL PEEP:  [5 cmH20] 5 cmH20  INTAKE / OUTPUT: I/O last 3 completed shifts: In: 5532.3 [I.V.:4207.3; NG/GT:925; IV Piggyback:400] Out: 2850 [Urine:2850]  PHYSICAL EXAMINATION: General: Frail looking male,on vent intubated Neuro: GCs<8T HEENT: PERRLA, Oral mucosa with moderate amount of thick secretions  Cardiovascular: ST, s1/s2, no MRG Lungs:  Labored, bilateral airflow, scattered rhonchi in all lung fields Abdomen: Soft, NT/ND, +BS X4 Musculoskeletal:  Right AKA, left leg with well healed bypass scar Extremities: left leg cold; no palpable pulses, +femoral pulse Skin: Cold; mild discoloration in left leg  LABS:  BMET  Recent Labs Lab 12/08/15 0533 12/09/15 0107 12/09/15 0427  NA 140 140 140  K 4.0 4.1 4.1  CL 105 106 105  CO2 '29 30 31  '$ BUN 40* 36* 38*  CREATININE 3.07* 2.73* 2.66*  GLUCOSE 181* 163* 180*    Electrolytes  Recent Labs Lab 12/06/15 2007 12/07/15 0622 12/08/15 0533 12/09/15 0107 12/09/15 0427  CALCIUM 7.3* 6.9* 7.0* 7.0* 7.1*  MG 1.4* 1.3* 1.6*  --   --   PHOS 4.0 4.8* 4.7*  --  4.3    CBC  Recent Labs Lab 12/06/15 2007 12/07/15 0658 12/09/15 0427   WBC 10.5 17.1* 16.9*  HGB 4.8* 8.3* 6.3*  HCT 15.0* 25.1* 19.8*  PLT 85* 76* 90*    Coag's  Recent Labs Lab 12/05/15 0344  12/05/15 2053 12/06/15 0332 12/06/15 0832  APTT 41*  < > 132* 79* 72*  INR 1.99  --   --   --   --   < > = values in this interval not displayed.  Sepsis Markers  Recent Labs Lab 12/06/15 1830  12/07/15 0304 12/07/15 0623 12/07/15 1418  LATICACIDVEN  --   < > 3.2* 4.8* 3.0*  PROCALCITON 0.54  --   --   --   --   < > = values in this interval not displayed.  ABG  Recent Labs Lab 12/06/15 1935 12/06/15 2316 12/07/15 0854  PHART 7.52* 7.43 7.49*  PCO2ART 26* 39 36  PO2ART 168* 173* 179*    Liver Enzymes  Recent Labs Lab 12/05/15 0344 12/09/15 0427  AST 17  --   ALT 11*  --   ALKPHOS 83  --   BILITOT 0.4  --   ALBUMIN 3.9 1.9*    Cardiac Enzymes  Recent Labs Lab 12/06/15 2007 12/07/15 0622 12/07/15 0658  TROPONINI 22.40* 62.60* 57.81*    Glucose  Recent Labs Lab 12/08/15 1625 12/08/15 2241 12/09/15 0053 12/09/15 0605 12/09/15 0759 12/09/15 1038  GLUCAP 173* 216* 153* 154* 137* 164*  Imaging No results found.  STUDIES:  02/11: STAT EKG>>  CULTURES: 02/11 Blood x2>> Urine>> Respiratory>>heavy growth staph  ANTIBIOTICS: Piperacillin-tazobactam 02/11> Vancomycin 02/11>  SIGNIFICANT EVENTS: 02/07>ED with SOB and left calf pain>treated and released 02/09>ED with cold left foot and calf pain>CT angio>complete occlusion of the left fem-pop bypass graft>admitted 02/11>OR for angiogram with thrombolysis, thrombectomy of left peroneal artery, tibioperoneal trunk, popliteal artery, and femoral to popliteal bypass graft and percutaneous transluminal angioplasty of the left peroneal artery and distal bypass anastomosis and popliteal artery Post-op: residual thrombosis of femoral to popliteal bypass graft and all tibial vessels; acute respiratory failure>Intubated   LINES/TUBES: Right IJ tlc 02/10> ETT  02/11> L Fem vascath 2/13>>  DISCUSSION: 70 YO male with acute complete occlusion of the  left fem-pop bypass graft s/p thrombolysis/thrombectomy/percuteneous transluminal angioplasty with residual thrombosis, sepsis secondary to possible necrosis from left leg thrombosis, and  acute hypoxic respiratory failure secondary to sepsis and complicated by acute NSTEMI and worsening renal fcn, now slowly improving.   ASSESSMENT / PLAN:  PULMONARY A: Acute hypoxic respiratory failure Possible aspiration pneumonia-Copious amount of secretions noted in airway during intubation Severe metabolic acidosis H/O Lung cancer s/p right lung lobectomy P:   -Full vent support-Settings: PRVC 60%/5/20/500 -ABG pending -VAP prevention protocol -F/U CXR as needed -Albuterol and Duoneb prn -Abx as above -F/U cultures - respiratory now with stap, currently being treated.   CARDIOVASCULAR-NSTEMI A:  Bradycardia-Resolved; Now in ST Septic shock H/O hypertension NSTEMI P:  -IV fluid boluses until CVP GE12 -Levophed gtt and titrate to MAP goal >65 -Hemodynamics per ICU protocol -Hold home BP meds  -cardiology following - rec low dose ASA -check ECHO  RENAL A:   AKI secondary procedural IV dye and volume depletion; baseline creatinine 0.83 P:   -IV fluids, bicarb infusion -Trend creatinine -Renally dose meds -nephro following - vascath placed, no dialysis at this time due to increase UOP.  Large UOP, possible post ATN phase.   GASTROINTESTINAL A:   Constipation P:   -PPI for GI prophylaxis -Monitor BMs -cont with TF  HEMATOLOGIC A:   Left leg ischemia-Complete Occlusion of the left fem-pop bypass graft s/p thrombolysis/PTA with residual thrombosis Right neck hematoma s/p TLC placement Severe PVD s/p Right AKA P:  -Vascular surgery following -Anticoagulation per vasc surgery -Monitor left limb for re-thrombosis -Monitor for bleeding-transfuse as needed, -DVT - heparin on hold, due to  neck hematoma  INFECTIOUS A:   Septic shock-likely source is left leg ischemia/necrosis from massive thrombus P:   -Antibiotics as above -F/U cultures -IV fluids and pressure to keep MAP>65 -F/U lactic acid level and procalcitonin  ENDOCRINE A:   Type 2 diabetes mellitus   P:   -Blood glucose monitoring with SSI coverage -Hold oral hypoglycemic agents  NEUROLOGIC A:   Acute metabolic encephalopathy H/o CVA H/O Seizures-Not on home seizure meds P:   RASS goal: -2 -Fentanyl infusion and versed  Prn for vent sedation -Monitor for seizure activity   The Patient requires high complexity decision making for assessment and support, frequent evaluation and titration of therapies, application of advanced monitoring technologies and extensive interpretation of multiple databases.  Critical Care Time devoted to patient care services described in this note is 40 minutes.   Overall, patient is critically ill, prognosis is guarded.  Patient with Multiorgan failure and at high risk for cardiac arrest and death.    Vilinda Boehringer, MD [facility] Pager [phone] (please enter 7-digits) On  Call Pager [phone] (please enter 7-digits)

## 2015-12-09 NOTE — Progress Notes (Signed)
pt was voiding around foley catheter. Spoke with Dr. Juleen China, he advised to pull foley, apply a condom cath and order a urology consult.

## 2015-12-09 NOTE — Progress Notes (Signed)
Nutrition Follow-up    INTERVENTION:   EN: discussed nutritional poc with MD Mungal; recommend changing to Vital AF 1.2 at rate of 45 ml/hr with Prostat 4 times daily; meets 100% estimated calorie and protein needs per ASPEN guidelines   NUTRITION DIAGNOSIS:   Inadequate oral intake related to inability to eat as evidenced by NPO status. 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  Pt remains on vent, agitated at times; plan for AKA when stable; vascath placed, HD on hold at present  EN: tolerating Vital High Protein at rate of 20 ml/hr  Digestive System: no signs of TF intolerance  Last BM:  12/07/2015   Nutritional Anemia Profile:  CBC Latest Ref Rng 12/09/2015 12/07/2015 12/06/2015  WBC 3.8 - 10.6 K/uL 16.9(H) 17.1(H) 10.5  Hemoglobin 13.0 - 18.0 g/dL 6.3(L) 8.3(L) 4.8(LL)  Hematocrit 40.0 - 52.0 % 19.8(L) 25.1(L) 15.0(L)  Platelets 150 - 440 K/uL 90(L) 76(L) 85(L)     Recent Labs Lab 12/06/15 2007 12/07/15 0622 12/08/15 0533 12/09/15 0107 12/09/15 0427  NA 142 141 140 140 140  K 4.1 4.0 4.0 4.1 4.1  CL 108 105 105 106 105  CO2 '24 24 29 30 31  '$ BUN 31* 35* 40* 36* 38*  CREATININE 2.69* 2.78* 3.07* 2.73* 2.66*  CALCIUM 7.3* 6.9* 7.0* 7.0* 7.1*  MG 1.4* 1.3* 1.6*  --   --   PHOS 4.0 4.8* 4.7*  --  4.3  GLUCOSE 176* 212* 181* 163* 180*     Glucose Profile:  Recent Labs  12/09/15 0605 12/09/15 0759 12/09/15 1038  GLUCAP 154* 137* 164*   Meds: ss novolog, LR at 100 ml/hr  Height:   Ht Readings from Last 1 Encounters:  12/05/15 '6\' 4"'$  (1.93 m)    Weight:   Wt Readings from Last 1 Encounters:  12/09/15 171 lb 4.8 oz (77.7 kg)    Filed Weights   12/05/15 0645 12/08/15 0500 12/09/15 0553  Weight: 143 lb 8 oz (65.091 kg) 163 lb 9.3 oz (74.2 kg) 171 lb 4.8 oz (77.7 kg)    BMI:  Body mass index is 20.86 kg/(m^2).  Estimated Nutritional Needs:   Kcal:  1738kcals, using [employer] Equation with ABW of 65kg, (BEE: 1506kcals, Temp: 37.7, Ve: 6.5)  Protein:  136-182g protein (1.5-2.0g/kg) using IBW of 91kg  Fluid:  2275-2772m of fluid (25-368mkg) using IBW of 91kg  HIGH Care Level  CaBorgWarnerS, RD, LDN ([phone]  ([phone]/On-Call Pager

## 2015-12-09 NOTE — Progress Notes (Signed)
[facility]  Daily Progress Note   Subjective  - 3/4 Day Post-Op  Intubated and sedated with severe acidosis Saturday  Continues to be hypotensive, off pressors now and BP better but still borderline Became very combative but is not purposeful Critically ill  Objective Filed Vitals:   12/09/15 1046 12/09/15 1100 12/09/15 1200 12/09/15 1236  BP: 104/59 106/60 98/60 104/71  Pulse: 86 88 84 87  Temp:  99.9 F (37.7 C) 100 F (37.8 C) 99.9 F (37.7 C)  TempSrc:      Resp:  '15 13 14  '$ Height:      Weight:      SpO2:  100% 100% 100%    Intake/Output Summary (Last 24 hours) at 12/09/15 1312 Last data filed at 12/09/15 1200  Gross per 24 hour  Intake 3895.66 ml  Output   2625 ml  Net 1270.66 ml    PULM  CTAB CV  RRR VASC  Left leg cool to the calf, no pulses present  Laboratory CBC    Component Value Date/Time   WBC 16.9* 12/09/2015 0427   WBC 4.9 10/25/2014 1331   HGB 6.3* 12/09/2015 0427   HGB 12.1* 10/25/2014 1331   HCT 19.8* 12/09/2015 0427   HCT 38.1* 10/25/2014 1331   PLT 90* 12/09/2015 0427   PLT 151 10/25/2014 1331    BMET    Component Value Date/Time   NA 140 12/09/2015 0427   NA 138 10/25/2014 1331   K 4.1 12/09/2015 0427   K 3.9 10/25/2014 1331   CL 105 12/09/2015 0427   CL 109* 10/25/2014 1331   CO2 31 12/09/2015 0427   CO2 21 10/25/2014 1331   GLUCOSE 180* 12/09/2015 0427   GLUCOSE 232* 10/25/2014 1331   BUN 38* 12/09/2015 0427   BUN 17 10/25/2014 1331   CREATININE 2.66* 12/09/2015 0427   CREATININE 1.11 10/25/2014 1331   CALCIUM 7.1* 12/09/2015 0427   CALCIUM 8.7 10/25/2014 1331   GFRNONAA 23* 12/09/2015 0427   GFRNONAA >60 10/25/2014 1331   GFRNONAA 52* 07/08/2014 1051   GFRAA 26* 12/09/2015 0427   GFRAA >60 10/25/2014 1331   GFRAA >60 07/08/2014 1051    Assessment/Planning: POD #3/4 s/p thrombolysis and thrombectomy for ischemic leg   Acute MI  Hypotension on pressors (now off), bypass is reoccluded  and little we can do at this point to salvage   Bypass is likely rethrombosed secondary to MI and hemodynamic instability.  He will need an AKA on the left when stable enough to tolerate a general anesthesia  Head CT is negative for intracranial hemorrhage, subacute infarcts noted in the cerebellar area  Possible aspiration pneumonia. On abx  Critically ill.  Overall prognosis is very poor.  Appreciate critical care input  [name]  12/09/2015, 1:12 PM

## 2015-12-09 NOTE — Progress Notes (Signed)
[facility]  ROUNDING NOTE   Subjective:   Tmax 101.7 UOP 2275 hgb 6.3 Creatinine 2.66 (2.73) (3.07)  Of norepinephrine  Objective:  Vital signs in last 24 hours:  Temp:  [99.9 F (37.7 C)-101.7 F (38.7 C)] 100.2 F (37.9 C) (02/14 1000) Pulse Rate:  [85-115] 85 (02/14 1000) Resp:  [11-19] 13 (02/14 1000) BP: (87-125)/(54-69) 97/62 mmHg (02/14 1000) SpO2:  [93 %-100 %] 99 % (02/14 1000) FiO2 (%):  [30 %] 30 % (02/14 1000) Weight:  [77.7 kg (171 lb 4.8 oz)] 77.7 kg (171 lb 4.8 oz) (02/14 0553)  Weight change: 3.5 kg (7 lb 11.5 oz) Filed Weights   12/05/15 0645 12/08/15 0500 12/09/15 0553  Weight: 65.091 kg (143 lb 8 oz) 74.2 kg (163 lb 9.3 oz) 77.7 kg (171 lb 4.8 oz)    Intake/Output: I/O last 3 completed shifts: In: 5532.3 [I.V.:4207.3; NG/GT:925; IV Piggyback:400] Out: 2850 [Urine:2850]   Intake/Output this shift:  Total I/O In: 578 [I.V.:243; Blood:295; NG/GT:40] Out: 475 [Urine:475]  Physical Exam: General: Critically ill  Head: Eyes closed, ETT, NGT  Eyes: Eyes closed  Neck: Right central line IJ  Lungs:  Bilateral diminished breath sounds, PRVC 30%  Heart: Sinus to sinus tach  Abdomen:  Soft, nontender  Extremities:  Right AKA, left lower extremity with cyanosis and diminished pulses  Neurologic: Intubated and sedated  Skin: No lesions  Access: Right femoral vascath 12/08/15 Dr. Stevenson Clinch    Basic Metabolic Panel:  Recent Labs Lab 12/06/15 2007 12/07/15 0622 12/08/15 0533 12/09/15 0107 12/09/15 0427  NA 142 141 140 140 140  K 4.1 4.0 4.0 4.1 4.1  CL 108 105 105 106 105  CO2 '24 24 29 30 31  '$ GLUCOSE 176* 212* 181* 163* 180*  BUN 31* 35* 40* 36* 38*  CREATININE 2.69* 2.78* 3.07* 2.73* 2.66*  CALCIUM 7.3* 6.9* 7.0* 7.0* 7.1*  MG 1.4* 1.3* 1.6*  --   --   PHOS 4.0 4.8* 4.7*  --  4.3    Liver Function Tests:  Recent Labs Lab 12/05/15 0344 12/09/15 0427  AST 17  --   ALT 11*  --   ALKPHOS 83  --   BILITOT 0.4  --   PROT  8.0  --   ALBUMIN 3.9 1.9*   No results for input(s): LIPASE, AMYLASE in the last 168 hours. No results for input(s): AMMONIA in the last 168 hours.  CBC:  Recent Labs Lab 12/05/15 1948 12/06/15 0332 12/06/15 2007 12/07/15 0658 12/09/15 0427  WBC 15.0* 10.0 10.5 17.1* 16.9*  HGB 10.2* 8.7* 4.8* 8.3* 6.3*  HCT 32.8* 27.4* 15.0* 25.1* 19.8*  MCV 79.7* 79.6* 78.1* 81.8 83.4  PLT 164 156 85* 76* 90*    Cardiac Enzymes:  Recent Labs Lab 12/02/15 1339 12/06/15 1647 12/06/15 2007 12/07/15 0622 12/07/15 0658 12/07/15 1418  CKTOTAL  --   --   --   --   --  104  TROPONINI <0.03 2.41* 22.40* 62.60* 57.81*  --     BNP: Invalid input(s): POCBNP  CBG:  Recent Labs Lab 12/08/15 1625 12/08/15 2241 12/09/15 0053 12/09/15 0605 12/09/15 0759  GLUCAP 173* 216* 153* 154* 59*    Microbiology: Results for orders placed or performed during the hospital encounter of 12/05/15  Culture, respiratory (NON-Expectorated)     Status: None   Collection Time: 12/06/15  4:05 PM  Result Value Ref Range Status   Specimen Description NASAL SWAB  Final   Special Requests NONE  Final  Gram Stain RARE WBC SEEN FEW GRAM POSITIVE COCCI   Final   Culture HEAVY GROWTH STAPHYLOCOCCUS AUREUS  Final   Report Status 12/09/2015 FINAL  Final   Organism ID, Bacteria STAPHYLOCOCCUS AUREUS  Final      Susceptibility   Staphylococcus aureus - MIC*    CIPROFLOXACIN 2 INTERMEDIATE Intermediate     GENTAMICIN <=0.5 SENSITIVE Sensitive     OXACILLIN <=0.25 SENSITIVE Sensitive     TETRACYCLINE <=1 SENSITIVE Sensitive     VANCOMYCIN 1 SENSITIVE Sensitive     TRIMETH/SULFA <=10 SENSITIVE Sensitive     RIFAMPIN <=0.5 SENSITIVE Sensitive     Inducible Clindamycin NEGATIVE Sensitive     * HEAVY GROWTH STAPHYLOCOCCUS AUREUS  Culture, blood (Routine X 2) w Reflex to ID Panel     Status: None (Preliminary result)   Collection Time: 12/07/15 12:09 AM  Result Value Ref Range Status   Specimen Description  BLOOD BLOOD RIGHT HAND  Final   Special Requests BOTTLES DRAWN AEROBIC AND ANAEROBIC 10CC  Final   Culture NO GROWTH 2 DAYS  Final   Report Status PENDING  Incomplete  Culture, blood (Routine X 2) w Reflex to ID Panel     Status: None (Preliminary result)   Collection Time: 12/07/15 12:09 AM  Result Value Ref Range Status   Specimen Description BLOOD BLOOD LEFT HAND  Final   Special Requests BOTTLES DRAWN AEROBIC AND ANAEROBIC 10CC  Final   Culture NO GROWTH 2 DAYS  Final   Report Status PENDING  Incomplete  Urine culture     Status: None   Collection Time: 12/07/15 12:11 AM  Result Value Ref Range Status   Specimen Description URINE, CLEAN CATCH  Final   Special Requests NONE  Final   Culture NO GROWTH 1 DAY  Final   Report Status 12/08/2015 FINAL  Final  MRSA PCR Screening     Status: None   Collection Time: 12/08/15  9:12 AM  Result Value Ref Range Status   MRSA by PCR NEGATIVE NEGATIVE Final    Comment:        The GeneXpert MRSA Assay (FDA approved for NASAL specimens only), is one component of a comprehensive MRSA colonization surveillance program. It is not intended to diagnose MRSA infection nor to guide or monitor treatment for MRSA infections.     Coagulation Studies: No results for input(s): LABPROT, INR in the last 72 hours.  Urinalysis: No results for input(s): COLORURINE, LABSPEC, PHURINE, GLUCOSEU, HGBUR, BILIRUBINUR, KETONESUR, PROTEINUR, UROBILINOGEN, NITRITE, LEUKOCYTESUR in the last 72 hours.  Invalid input(s): APPERANCEUR    Imaging: No results found.   Medications:   . lactated ringers 100 mL/hr at 12/08/15 2309  . nitroGLYCERIN    . norepinephrine (LEVOPHED) Adult infusion 1 mcg/min (12/06/15 2312)  . sodium chloride 0.9 % 200 mL with fentaNYL (SUBLIMAZE) 2,500 mcg infusion 20 mL/hr at 12/08/15 2302   . sodium chloride   Intravenous Once  . antiseptic oral rinse  7 mL Mouth Rinse QID  . bisacodyl  10 mg Rectal Once  . chlorhexidine  gluconate  15 mL Mouth Rinse BID  . feeding supplement (PRO-STAT SUGAR FREE 64)  30 mL Oral BID  . feeding supplement (VITAL HIGH PROTEIN)  1,000 mL Per Tube Q24H  . insulin aspart  0-15 Units Subcutaneous Q4H  . insulin regular  8 Units Subcutaneous Once  . ipratropium-albuterol  3 mL Nebulization Q6H  . metoprolol tartrate  12.5 mg Per Tube BID  . pantoprazole (PROTONIX)  IV  40 mg Intravenous Daily  . piperacillin-tazobactam (ZOSYN)  IV  3.375 g Intravenous 3 times per day  . sodium chloride flush  3 mL Intravenous Q12H  . vancomycin  750 mg Intravenous Q36H   acetaminophen **OR** [DISCONTINUED] acetaminophen, albuterol, bisacodyl, fentaNYL (SUBLIMAZE) injection, heparin, hydrALAZINE, labetalol, metoprolol, midazolam, ondansetron **OR** ondansetron (ZOFRAN) IV, sennosides, sodium chloride flush  Assessment/ Plan:  Mr. Kyshawn Teal is a 70 y.o. black  male with hypertension, hyperlipidemia, diabetes mellitus type II, coronary artery disease status post 2 vessel CABG, peripheral arterial disease status post right above-the-knee amputation, seizure disorder, lung cancer, prostate cancer, history of CVA, who was admitted to [facility] on 12/05/2015 for ischemic left lower extremity. Hospital course complicated by acute renal failure, acute respiratory failure, new CVAs, myocardial infarction.  1. Acute renal failure:  ATN from contrast exposure. nonoliguric urine output. renal function stable. Baseline creatinine of 0.83. Electrolytes currently at goal.  - Hold hemodialysis for now. Continue monitor closely for dialysis need - Renally dose all medications  2. Acute respiratory failure: Status post intubation and on mechanical ventilation. With acute myocardial infarction and acute CVA - echocardiogram with diastolic failure.  - Appreciate cardiology and pulmonary input.   3. Ischemic left lower extremity: peripheral vascular disease: and now with acute DVT.  - Appreciate vascular input.  Discussed case with Dr. Delana Meyer  4. Anemia with renal failure: hemoglobin of 6.3 - transfusion PRBC ordered.    LOS: 4 Stephen Camacho 2/14/201710:36 AM

## 2015-12-09 NOTE — Progress Notes (Addendum)
Patient Name: Stephen Camacho      SUBJECTIVE: intubated opens eyes to vioice No response to question of chest pain  Past Medical History  Diagnosis Date  . Hyperlipidemia   . Hypertension   . Diabetes mellitus ([place])   . CAD (coronary artery disease)     a. s/p 2 v CABG in 2012 (LIMA-LAD & SVG-OM); b. cath 07/2012 s/p PCI/DES to ostial LCx and OM  . PAD (peripheral artery disease) ([place])     a. s/p right SFA stent; b. s/p right toe amputation 2011; c. s/p right AKA spring 2016  . Seizures ([place])   . Gangrene of foot ([place])   . Wheezing   . Lung cancer ([place])   . Prostate cancer ([place])   . Stroke [facility])     Scheduled Meds:  Scheduled Meds: . sodium chloride   Intravenous Once  . antiseptic oral rinse  7 mL Mouth Rinse QID  . bisacodyl  10 mg Rectal Once  . chlorhexidine gluconate  15 mL Mouth Rinse BID  . feeding supplement (PRO-STAT SUGAR FREE 64)  30 mL Oral BID  . feeding supplement (VITAL HIGH PROTEIN)  1,000 mL Per Tube Q24H  . insulin aspart  0-15 Units Subcutaneous Q4H  . insulin regular  8 Units Subcutaneous Once  . ipratropium-albuterol  3 mL Nebulization Q6H  . pantoprazole (PROTONIX) IV  40 mg Intravenous Daily  . piperacillin-tazobactam (ZOSYN)  IV  3.375 g Intravenous 3 times per day  . sodium chloride flush  3 mL Intravenous Q12H  . vancomycin  750 mg Intravenous Q36H   Continuous Infusions: . lactated ringers 100 mL/hr at 12/08/15 2309  . nitroGLYCERIN    . norepinephrine (LEVOPHED) Adult infusion 1 mcg/min (12/06/15 2312)  . sodium chloride 0.9 % 200 mL with fentaNYL (SUBLIMAZE) 2,500 mcg infusion 20 mL/hr at 12/08/15 2302   acetaminophen **OR** [DISCONTINUED] acetaminophen, albuterol, bisacodyl, fentaNYL (SUBLIMAZE) injection, heparin, hydrALAZINE, labetalol, metoprolol, midazolam, ondansetron **OR** ondansetron (ZOFRAN) IV, sennosides, sodium chloride flush    PHYSICAL EXAM Filed Vitals:   12/09/15 0553 12/09/15 0600 12/09/15 0700 12/09/15  0800  BP:  125/62 115/64 112/69  Pulse:  91 94   Temp:  100.2 F (37.9 C) 99.9 F (37.7 C) 100 F (37.8 C)  TempSrc:      Resp:  '13 13 11  '$ Height:      Weight: 171 lb 4.8 oz (77.7 kg)     SpO2:  100% 98%     Intubated  Opens eyes but does not respond to questions Cachetic HENT normal Neck supple   Clear anteriorly but sonorous noise with breathing Regular but rapid rate and rhythm, no murmurs or gallops Abd-soft with active BS without hepatomegaly No Clubbing cyanosis edema;  R AKA  And Left leg cold Skin-warm and dry  Cap refill in hand <2 sec A rousses  TELEMETRY: Reviewed telemetry pt in sinus tach    Intake/Output Summary (Last 24 hours) at 12/09/15 0836 Last data filed at 12/09/15 0800  Gross per 24 hour  Intake 3797.65 ml  Output   2625 ml  Net 1172.65 ml    LABS: Basic Metabolic Panel:  Recent Labs Lab 12/05/15 0344 12/06/15 0332  12/06/15 2007 12/07/15 0622 12/08/15 0533 12/09/15 0107 12/09/15 0427  NA 137 137  --  142 141 140 140 140  K 4.1 4.5  --  4.1 4.0 4.0 4.1 4.1  CL 107 104  --  108 105 105 106  105  CO2 24 23  --  '24 24 29 30 31  '$ GLUCOSE 133* 447*  --  176* 212* 181* 163* 180*  BUN 15 18  --  31* 35* 40* 36* 38*  CREATININE 0.83 1.45*  --  2.69* 2.78* 3.07* 2.73* 2.66*  CALCIUM 9.1 8.3*  --  7.3* 6.9* 7.0* 7.0* 7.1*  MG  --   --   < > 1.4* 1.3* 1.6*  --   --   PHOS  --   --   < > 4.0 4.8* 4.7*  --  4.3  < > = values in this interval not displayed. Cardiac Enzymes:  Recent Labs  12/06/15 2007 12/07/15 0622 12/07/15 0658 12/07/15 1418  CKTOTAL  --   --   --  104  TROPONINI 22.40* 62.60* 57.81*  --    CBC:  Recent Labs Lab 12/05/15 0344 12/05/15 1631 12/05/15 1948 12/06/15 0332 12/06/15 2007 12/07/15 0658 12/09/15 0427  WBC 7.9 16.6* 15.0* 10.0 10.5 17.1* 16.9*  HGB 9.9* 10.9* 10.2* 8.7* 4.8* 8.3* 6.3*  HCT 31.2* 34.0* 32.8* 27.4* 15.0* 25.1* 19.8*  MCV 78.4* 77.9* 79.7* 79.6* 78.1* 81.8 83.4  PLT 196 195 164 156 85*  76* 90*   PROTIME: No results for input(s): LABPROT, INR in the last 72 hours. Liver Function Tests:  Recent Labs  12/09/15 0427  ALBUMIN 1.9*     ASSESSMENT AND PLAN:  Active Problems:   Ischemic leg   Acute respiratory failure (HCC)   Ischemia of lower extremity   Altered mental status   Hematoma of neck   Occlusion of arterial bypass graft (HCC)   NSTEMI (non-ST elevated myocardial infarction) (HCC)   Severe sepsis (HCC)  Anemia  NSTEMI  Tn >60  CAD s/p CABG  S/p thrmobectomy ischemic leg  CVA  Acute ? Embolic  Acute on chronic renal failure  Now improving  Septic shock   Appropriate to transfuse given MI  May help with sinus tach  Much of care-cardiac needs to be deferred given bleeding  Agree with transfusion plans  coninue ASA   Will add low dose BB with BP better today       Signed, Virl Axe MD  12/09/2015

## 2015-12-09 NOTE — Progress Notes (Signed)
Pt resting quietly and RN now able to do a more thorough assessment of Pt's eyes. Pt's eyes appear to have a slight unequal outward gaze. Pupils remain size 58m with a sluggish reaction. Pt still doesn't follow commands. Vitals remain stable. No other changes noted. Will continue to monitor.

## 2015-12-10 ENCOUNTER — Encounter: Payer: Self-pay | Admitting: Vascular Surgery

## 2015-12-10 DIAGNOSIS — R339 Retention of urine, unspecified: Secondary | ICD-10-CM

## 2015-12-10 DIAGNOSIS — T82399D Other mechanical complication of unspecified vascular grafts, subsequent encounter: Secondary | ICD-10-CM

## 2015-12-10 LAB — CBC WITH DIFFERENTIAL/PLATELET
BASOS ABS: 0 10*3/uL (ref 0–0.1)
BASOS PCT: 0 %
EOS ABS: 0.1 10*3/uL (ref 0–0.7)
EOS PCT: 0 %
HEMATOCRIT: 26.6 % — AB (ref 40.0–52.0)
Hemoglobin: 8.7 g/dL — ABNORMAL LOW (ref 13.0–18.0)
Lymphocytes Relative: 3 %
Lymphs Abs: 0.6 10*3/uL — ABNORMAL LOW (ref 1.0–3.6)
MCH: 27.5 pg (ref 26.0–34.0)
MCHC: 32.6 g/dL (ref 32.0–36.0)
MCV: 84.5 fL (ref 80.0–100.0)
MONO ABS: 1.3 10*3/uL — AB (ref 0.2–1.0)
MONOS PCT: 7 %
NEUTROS ABS: 16.6 10*3/uL — AB (ref 1.4–6.5)
Neutrophils Relative %: 90 %
PLATELETS: 105 10*3/uL — AB (ref 150–440)
RBC: 3.15 MIL/uL — ABNORMAL LOW (ref 4.40–5.90)
RDW: 15.8 % — AB (ref 11.5–14.5)
WBC: 18.6 10*3/uL — ABNORMAL HIGH (ref 3.8–10.6)

## 2015-12-10 LAB — BASIC METABOLIC PANEL
Anion gap: 6 (ref 5–15)
BUN: 39 mg/dL — AB (ref 6–20)
CHLORIDE: 107 mmol/L (ref 101–111)
CO2: 30 mmol/L (ref 22–32)
Calcium: 7.7 mg/dL — ABNORMAL LOW (ref 8.9–10.3)
Creatinine, Ser: 2.19 mg/dL — ABNORMAL HIGH (ref 0.61–1.24)
GFR calc Af Amer: 33 mL/min — ABNORMAL LOW (ref >60)
GFR calc non Af Amer: 29 mL/min — ABNORMAL LOW (ref >60)
GLUCOSE: 188 mg/dL — AB (ref 65–99)
POTASSIUM: 4 mmol/L (ref 3.5–5.1)
SODIUM: 143 mmol/L (ref 135–145)

## 2015-12-10 LAB — TYPE AND SCREEN
ABO/RH(D): B POS
Antibody Screen: NEGATIVE
UNIT DIVISION: 0
UNIT DIVISION: 0
UNIT DIVISION: 0
Unit division: 0
Unit division: 0

## 2015-12-10 LAB — GLUCOSE, CAPILLARY
GLUCOSE-CAPILLARY: 152 mg/dL — AB (ref 65–99)
GLUCOSE-CAPILLARY: 132 mg/dL — AB (ref 65–99)
GLUCOSE-CAPILLARY: 160 mg/dL — AB (ref 65–99)
GLUCOSE-CAPILLARY: 100 mg/dL — AB (ref 65–99)
Glucose-Capillary: 135 mg/dL — ABNORMAL HIGH (ref 65–99)
Glucose-Capillary: 146 mg/dL — ABNORMAL HIGH (ref 65–99)
Glucose-Capillary: 170 mg/dL — ABNORMAL HIGH (ref 65–99)
Glucose-Capillary: 189 mg/dL — ABNORMAL HIGH (ref 65–99)
Glucose-Capillary: 203 mg/dL — ABNORMAL HIGH (ref 65–99)

## 2015-12-10 LAB — MAGNESIUM: MAGNESIUM: 2 mg/dL (ref 1.7–2.4)

## 2015-12-10 LAB — VANCOMYCIN, TROUGH: VANCOMYCIN TR: 9 ug/mL — AB (ref 10–20)

## 2015-12-10 MED ORDER — PANTOPRAZOLE SODIUM 40 MG PO PACK
40.0000 mg | PACK | Freq: Every day | ORAL | Status: DC
  Administered 2015-12-11 – 2015-12-25 (×14): 40 mg
  Filled 2015-12-10 (×17): qty 20

## 2015-12-10 MED ORDER — FREE WATER
30.0000 mL | Status: DC
  Administered 2015-12-10 – 2015-12-13 (×17): 30 mL

## 2015-12-10 MED ORDER — VANCOMYCIN HCL IN DEXTROSE 1-5 GM/200ML-% IV SOLN
1000.0000 mg | INTRAVENOUS | Status: DC
  Administered 2015-12-11: 1000 mg via INTRAVENOUS
  Filled 2015-12-10: qty 200

## 2015-12-10 NOTE — Progress Notes (Signed)
Pt having noticeably more pvc's.  Paged elink.  OK to go ahead and draw a magnesium, phosphorus and BMP per Dr. Oletta Darter.

## 2015-12-10 NOTE — Progress Notes (Signed)
[facility]  Daily Progress Note   Subjective  - 4/5 Day Post-Op  Intubated and sedated with severe acidosis on Saturday  Continues to be hypotensive, off pressors now and BP better but still borderline Became very combative but is not purposeful unchanged Critically ill  Objective Filed Vitals:   12/10/15 1400 12/10/15 1500 12/10/15 1600 12/10/15 1700  BP: 121/68 121/68 124/69 137/73  Pulse: 100 103 105 104  Temp:   98.8 F (37.1 C)   TempSrc:   Axillary   Resp: '15 18 15 19  '$ Height:      Weight:      SpO2: 100% 100% 100% 100%    Intake/Output Summary (Last 24 hours) at 12/10/15 1816 Last data filed at 12/10/15 1700  Gross per 24 hour  Intake 4231.64 ml  Output   2650 ml  Net 1581.64 ml    PULM  CTAB CV  RRR VASC  Left leg cool to the calf, no pulses present  Laboratory CBC    Component Value Date/Time   WBC 18.6* 12/10/2015 0816   WBC 4.9 10/25/2014 1331   HGB 8.7* 12/10/2015 0816   HGB 12.1* 10/25/2014 1331   HCT 26.6* 12/10/2015 0816   HCT 38.1* 10/25/2014 1331   PLT 105* 12/10/2015 0816   PLT 151 10/25/2014 1331    BMET    Component Value Date/Time   NA 143 12/10/2015 0442   NA 138 10/25/2014 1331   K 4.0 12/10/2015 0442   K 3.9 10/25/2014 1331   CL 107 12/10/2015 0442   CL 109* 10/25/2014 1331   CO2 30 12/10/2015 0442   CO2 21 10/25/2014 1331   GLUCOSE 188* 12/10/2015 0442   GLUCOSE 232* 10/25/2014 1331   BUN 39* 12/10/2015 0442   BUN 17 10/25/2014 1331   CREATININE 2.19* 12/10/2015 0442   CREATININE 1.11 10/25/2014 1331   CALCIUM 7.7* 12/10/2015 0442   CALCIUM 8.7 10/25/2014 1331   GFRNONAA 29* 12/10/2015 0442   GFRNONAA >60 10/25/2014 1331   GFRNONAA 52* 07/08/2014 1051   GFRAA 33* 12/10/2015 0442   GFRAA >60 10/25/2014 1331   GFRAA >60 07/08/2014 1051    Assessment/Planning: POD #3/4 s/p thrombolysis and thrombectomy for ischemic leg   Bypass is likely rethrombosed secondary to MI and hemodynamic  instability.  He will need an AKA on the left when stable enough to tolerate a general anesthesia. I discussed the situation with cardiology who feel that it is acceptable to proceed with surgery. I will discuss the situation with the son tomorrow and potentially place the patient on the schedule for Friday  Acute MI his hemodynamics have been stable and echo showed an EF greater than 30%  Hypotension on pressors (now off), bypass is reoccluded and little we can do at this point to salvage   Head CT is negative for intracranial hemorrhage, subacute infarcts noted in the cerebellar area  Possible aspiration pneumonia. On abx  Critically ill.  Overall prognosis is very poor.  Appreciate critical care input will plan to discuss the next stage of his care with family tomorrow  [name]  12/10/2015, 6:16 PM

## 2015-12-10 NOTE — Consult Note (Signed)
Urology Consult  I have been asked to see the patient by Dr. Juleen China, for evaluation and management of urinary retention.  Chief Complaint: retention  History of Present Illness: Stephen Camacho is a 70 y.o. year old admitted on 12/05/15 with ischemic left lower extremity s/p thrombectomy/ thromolysis, acute renal failure, respiratory failure, CVA, and MI.  Urology asked to consult for urinary retention.  He is currently intubated in the ICU and remains critically ill.  His Foley was removed yesterday due to leakage around the Foley which was subsequently removed. This was replaced with a condom catheter and supposedly he was voiding well with this. Bladder scan however showed greater than 600 cc in his bladder and per my recommendations the Foley catheter was replaced.  Per review of medical history, he does have a history of prostate cancer but is unable to provide any additional information given his current medical status.  Creatinine today 2.19 down from 3.07. Urine output is excellent.  Past Medical History  Diagnosis Date  . Hyperlipidemia   . Hypertension   . Diabetes mellitus ([place])   . CAD (coronary artery disease)     a. s/p 2 v CABG in 2012 (LIMA-LAD & SVG-OM); b. cath 07/2012 s/p PCI/DES to ostial LCx and OM  . PAD (peripheral artery disease) ([place])     a. s/p right SFA stent; b. s/p right toe amputation 2011; c. s/p right AKA spring 2016  . Seizures ([place])   . Gangrene of foot ([place])   . Wheezing   . Lung cancer ([place])   . Prostate cancer ([place])   . Stroke [facility])     Past Surgical History  Procedure Laterality Date  . Right aka  07-10-2014  . Left femoral popliteal bypass    . Right lung lobeectomy    . Coronary artery bypass graft    . Peripheral vascular catheterization Left 12/06/2015    Procedure: Lower Extremity Angiography;  Surgeon: [name], MD;  Location: [place] [facility];  Service: Cardiovascular;  Laterality: Left;  . Peripheral vascular  catheterization Left 12/06/2015    Procedure: Lower Extremity Intervention;  Surgeon: [name], MD;  Location: [place] [facility];  Service: Cardiovascular;  Laterality: Left;  . Peripheral vascular catheterization N/A 12/05/2015    Procedure: Lower Extremity Angiography;  Surgeon: [name], MD;  Location: [place] [facility];  Service: Cardiovascular;  Laterality: N/A;  . Peripheral vascular catheterization  12/05/2015    Procedure: Lower Extremity Intervention;  Surgeon: [name], MD;  Location: [place] [facility];  Service: Cardiovascular;;    Home Medications:    Medication List    ASK your doctor about these medications        acetaminophen 650 MG CR tablet  Commonly known as:  TYLENOL  Take 650 mg by mouth every 8 (eight) hours as needed for pain.     aspirin 81 MG chewable tablet  Chew 81 mg by mouth daily.     atorvastatin 40 MG tablet  Commonly known as:  LIPITOR  Take 40 mg by mouth daily at 6 PM.     cilostazol 50 MG tablet  Commonly known as:  PLETAL  Take 50 mg by mouth 2 (two) times daily.     insulin glargine 100 UNIT/ML injection  Commonly known as:  LANTUS  Inject 10 Units into the skin at bedtime.     isosorbide mononitrate 60 MG 24 hr tablet  Commonly known as:  IMDUR  Take 60 mg by mouth daily.     lisinopril 10 MG tablet  Commonly known as:  PRINIVIL,ZESTRIL  Take 10 mg by mouth daily.     meclizine 25 MG tablet  Commonly known as:  ANTIVERT  Take 1 tablet (25 mg total) by mouth 3 (three) times daily as needed for dizziness.     metFORMIN 500 MG tablet  Commonly known as:  GLUCOPHAGE  Take by mouth 2 (two) times daily with a meal.     metoprolol 50 MG tablet  Commonly known as:  LOPRESSOR  Take 50 mg by mouth 2 (two) times daily.     nitroGLYCERIN 0.4 MG SL tablet  Commonly known as:  NITROSTAT  Place 0.4 mg under the tongue every 5 (five) minutes as needed for chest pain.     ondansetron 4 MG tablet  Commonly known  as:  ZOFRAN  Take 1 tablet (4 mg total) by mouth every 8 (eight) hours as needed for nausea or vomiting.     rivaroxaban 20 MG Tabs tablet  Commonly known as:  XARELTO  Take 20 mg by mouth daily with supper.     traMADol 50 MG tablet  Commonly known as:  ULTRAM  Take 25-50 mg by mouth every 6 (six) hours as needed for severe pain.        Allergies: No Known Allergies  Family History  Problem Relation Age of Onset  . Family history unknown: Yes    Social History:  reports that he has never smoked. He does not have any smokeless tobacco history on file. He reports that he does not drink alcohol. His drug history is not on file.  ROS: Unable to obtain review of systems the patient is currently intubated.  Physical Exam:  Vital signs in last 24 hours: Temp:  [98.4 F (36.9 C)-100.4 F (38 C)] 98.8 F (37.1 C) (02/15 1600) Pulse Rate:  [84-107] 104 (02/15 1700) Resp:  [12-22] 19 (02/15 1700) BP: (102-137)/(62-94) 137/73 mmHg (02/15 1700) SpO2:  [94 %-100 %] 100 % (02/15 1700) FiO2 (%):  [30 %] 30 % (02/15 1525) Weight:  [174 lb 6.1 oz (79.1 kg)] 174 lb 6.1 oz (79.1 kg) (02/15 0900) Constitutional: Not responsive,  HEENT: Eyes closed.  Trachea midline, no masses Cardiovascular: Regular rate and rhythm.  Respiratory: Endotracheal tube in place. GI: Abdomen is soft, nontender, nondistended, no abdominal masses GU: Uncircumcised phallus with Foley catheter in place draining clear yellow urine. Skin: No rashes, bruises or suspicious lesions Neurologic: Grossly intact, no focal deficits, moving all 4 extremities Psychiatric: Sedated MSK: Right leg surgically absent   Laboratory Data:   Recent Labs  12/09/15 0427 12/09/15 2034 12/10/15 0816  WBC 16.9* 18.5* 18.6*  HGB 6.3* 8.2* 8.7*  HCT 19.8* 25.0* 26.6*    Recent Labs  12/09/15 0107 12/09/15 0427 12/10/15 0442  NA 140 140 143  K 4.1 4.1 4.0  CL 106 105 107  CO2 '30 31 30  '$ GLUCOSE 163* 180* 188*  BUN 36*  38* 39*  CREATININE 2.73* 2.66* 2.19*  CALCIUM 7.0* 7.1* 7.7*   No results for input(s): LABPT, INR in the last 72 hours. No results for input(s): LABURIN in the last 72 hours. Results for orders placed or performed during the hospital encounter of 12/05/15  Culture, respiratory (NON-Expectorated)     Status: None   Collection Time: 12/06/15  4:05 PM  Result Value Ref Range Status   Specimen Description NASAL SWAB  Final  Special Requests NONE  Final   Gram Stain RARE WBC SEEN FEW GRAM POSITIVE COCCI   Final   Culture HEAVY GROWTH STAPHYLOCOCCUS AUREUS  Final   Report Status 12/09/2015 FINAL  Final   Organism ID, Bacteria STAPHYLOCOCCUS AUREUS  Final      Susceptibility   Staphylococcus aureus - MIC*    CIPROFLOXACIN 2 INTERMEDIATE Intermediate     GENTAMICIN <=0.5 SENSITIVE Sensitive     OXACILLIN <=0.25 SENSITIVE Sensitive     TETRACYCLINE <=1 SENSITIVE Sensitive     VANCOMYCIN 1 SENSITIVE Sensitive     TRIMETH/SULFA <=10 SENSITIVE Sensitive     RIFAMPIN <=0.5 SENSITIVE Sensitive     Inducible Clindamycin NEGATIVE Sensitive     * HEAVY GROWTH STAPHYLOCOCCUS AUREUS  Culture, blood (Routine X 2) w Reflex to ID Panel     Status: None (Preliminary result)   Collection Time: 12/07/15 12:09 AM  Result Value Ref Range Status   Specimen Description BLOOD BLOOD RIGHT HAND  Final   Special Requests BOTTLES DRAWN AEROBIC AND ANAEROBIC 10CC  Final   Culture NO GROWTH 3 DAYS  Final   Report Status PENDING  Incomplete  Culture, blood (Routine X 2) w Reflex to ID Panel     Status: None (Preliminary result)   Collection Time: 12/07/15 12:09 AM  Result Value Ref Range Status   Specimen Description BLOOD BLOOD LEFT HAND  Final   Special Requests BOTTLES DRAWN AEROBIC AND ANAEROBIC 10CC  Final   Culture NO GROWTH 3 DAYS  Final   Report Status PENDING  Incomplete  Urine culture     Status: None   Collection Time: 12/07/15 12:11 AM  Result Value Ref Range Status   Specimen Description  URINE, CLEAN CATCH  Final   Special Requests NONE  Final   Culture NO GROWTH 1 DAY  Final   Report Status 12/08/2015 FINAL  Final  MRSA PCR Screening     Status: None   Collection Time: 12/08/15  9:12 AM  Result Value Ref Range Status   MRSA by PCR NEGATIVE NEGATIVE Final    Comment:        The GeneXpert MRSA Assay (FDA approved for NASAL specimens only), is one component of a comprehensive MRSA colonization surveillance program. It is not intended to diagnose MRSA infection nor to guide or monitor treatment for MRSA infections.      Assessment/ Plan:  1. Urinary retention- Not adequately emptying bladder with condom catheter. Foley catheter replaced per my recommendations earlier today with large residual. Please leave catheter in place while critically ill in ICU.  Voiding trial per primary team once out of ICU, recovering.  Please consider starting Flomax once able to swallow capsule after extubation.  2. Leakage around Foley- likely related to bladder spasms given the catheter was draining. If catheter fails to drain, recommend irrigation by hand.    3. Acute renal failure- nephrology following, improving. Non-oliguric. Maintain Foley catheter for maximal decompression.   4. Prostate cancer-  Details of oncologic history unknown (?2012), recommend outpatient follow-up.  12/10/2015, 5:32 PM  Hollice Espy,  MD   Thank you for involving me in this patient's care.  Please page with any further questions or concerns.

## 2015-12-10 NOTE — Progress Notes (Signed)
[facility]  ROUNDING NOTE   Subjective:   Tmax 100.4 UOP 2425 (2275) Transfusion yesterday  Creatinine 2.19 (2.66) (2.73) (3.07)  LR at 182m/hr  Objective:  Vital signs in last 24 hours:  Temp:  [98.4 F (36.9 C)-100.4 F (38 C)] 98.4 F (36.9 C) (02/15 0800) Pulse Rate:  [81-131] 98 (02/15 0800) Resp:  [12-19] 13 (02/15 0700) BP: (97-134)/(59-91) 132/70 mmHg (02/15 0800) SpO2:  [97 %-100 %] 98 % (02/15 0841) FiO2 (%):  [30 %] 30 % (02/15 0852) Weight:  [79.1 kg (174 lb 6.1 oz)] 79.1 kg (174 lb 6.1 oz) (02/15 0900)  Weight change:  Filed Weights   12/08/15 0500 12/09/15 0553 12/10/15 0900  Weight: 74.2 kg (163 lb 9.3 oz) 77.7 kg (171 lb 4.8 oz) 79.1 kg (174 lb 6.1 oz)    Intake/Output: I/O last 3 completed shifts: In: 6021.9 [I.V.:4242.4; Blood:660; NG/GT:869.5; IV Piggyback:250] Out: 39417[Urine:3550]   Intake/Output this shift:  Total I/O In: 645 [I.V.:375; NG/GT:120; IV Piggyback:150] Out: -   Physical Exam: General: Critically ill  Head: Eyes closed, ETT, NGT  Eyes: Eyes closed  Neck: Right central line IJ  Lungs:  Bilateral diminished breath sounds, PRVC 30%  Heart: Sinus to sinus tach  Abdomen:  Soft, nontender  Extremities:  Right AKA, left lower extremity with cyanosis and diminished pulses  Neurologic: Intubated and sedated  Skin: No lesions  Access: Right femoral vascath 12/08/15 Dr. MStevenson Clinch   Basic Metabolic Panel:  Recent Labs Lab 12/06/15 2007 12/07/15 [id number] 0533 12/09/15 0107 12/09/15 0427 12/10/15 0442  NA 142 141 140 140 140 143  K 4.1 4.0 4.0 4.1 4.1 4.0  CL 108 105 105 106 105 107  CO2 '24 24 29 30 31 30  '$ GLUCOSE 176* 212* 181* 163* 180* 188*  BUN 31* 35* 40* 36* 38* 39*  CREATININE 2.69* 2.78* 3.07* 2.73* 2.66* 2.19*  CALCIUM 7.3* 6.9* 7.0* 7.0* 7.1* 7.7*  MG 1.4* 1.3* 1.6*  --   --   --   PHOS 4.0 4.8* 4.7*  --  4.3  --     Liver Function Tests:  Recent Labs Lab 12/05/15 0344 12/09/15 0427   AST 17  --   ALT 11*  --   ALKPHOS 83  --   BILITOT 0.4  --   PROT 8.0  --   ALBUMIN 3.9 1.9*   No results for input(s): LIPASE, AMYLASE in the last 168 hours. No results for input(s): AMMONIA in the last 168 hours.  CBC:  Recent Labs Lab 12/06/15 2007 12/07/15 [id number]/17 0427 12/09/15 2034 12/10/15 0816  WBC 10.5 17.1* 16.9* 18.5* 18.6*  NEUTROABS  --   --   --  16.2* 16.6*  HGB 4.8* 8.3* 6.3* 8.2* 8.7*  HCT 15.0* 25.1* 19.8* 25.0* 26.6*  MCV 78.1* 81.8 83.4 84.1 84.5  PLT 85* 76* 90* 86* 105*    Cardiac Enzymes:  Recent Labs Lab 12/06/15 1647 12/06/15 2007 12/07/15 0622 12/07/15 0658 12/07/15 1418  CKTOTAL  --   --   --   --  104  TROPONINI 2.41* 22.40* 62.60* 57.81*  --     BNP: Invalid input(s): POCBNP  CBG:  Recent Labs Lab 12/09/15 1956 12/09/15 2222 12/10/15 0215 12/10/15 0557 12/10/15 0744  GLUCAP 133* 135* 189* 160 163    Microbiology: Results for orders placed or performed during the hospital encounter of 12/05/15  Culture, respiratory (NON-Expectorated)     Status: None   Collection Time: 12/06/15  4:05 PM  Result Value Ref Range Status   Specimen Description NASAL SWAB  Final   Special Requests NONE  Final   Gram Stain RARE WBC SEEN FEW GRAM POSITIVE COCCI   Final   Culture HEAVY GROWTH STAPHYLOCOCCUS AUREUS  Final   Report Status 12/09/2015 FINAL  Final   Organism ID, Bacteria STAPHYLOCOCCUS AUREUS  Final      Susceptibility   Staphylococcus aureus - MIC*    CIPROFLOXACIN 2 INTERMEDIATE Intermediate     GENTAMICIN <=0.5 SENSITIVE Sensitive     OXACILLIN <=0.25 SENSITIVE Sensitive     TETRACYCLINE <=1 SENSITIVE Sensitive     VANCOMYCIN 1 SENSITIVE Sensitive     TRIMETH/SULFA <=10 SENSITIVE Sensitive     RIFAMPIN <=0.5 SENSITIVE Sensitive     Inducible Clindamycin NEGATIVE Sensitive     * HEAVY GROWTH STAPHYLOCOCCUS AUREUS  Culture, blood (Routine X 2) w Reflex to ID Panel     Status: None (Preliminary result)    Collection Time: 12/07/15 12:09 AM  Result Value Ref Range Status   Specimen Description BLOOD BLOOD RIGHT HAND  Final   Special Requests BOTTLES DRAWN AEROBIC AND ANAEROBIC 10CC  Final   Culture NO GROWTH 3 DAYS  Final   Report Status PENDING  Incomplete  Culture, blood (Routine X 2) w Reflex to ID Panel     Status: None (Preliminary result)   Collection Time: 12/07/15 12:09 AM  Result Value Ref Range Status   Specimen Description BLOOD BLOOD LEFT HAND  Final   Special Requests BOTTLES DRAWN AEROBIC AND ANAEROBIC 10CC  Final   Culture NO GROWTH 3 DAYS  Final   Report Status PENDING  Incomplete  Urine culture     Status: None   Collection Time: 12/07/15 12:11 AM  Result Value Ref Range Status   Specimen Description URINE, CLEAN CATCH  Final   Special Requests NONE  Final   Culture NO GROWTH 1 DAY  Final   Report Status 12/08/2015 FINAL  Final  MRSA PCR Screening     Status: None   Collection Time: 12/08/15  9:12 AM  Result Value Ref Range Status   MRSA by PCR NEGATIVE NEGATIVE Final    Comment:        The GeneXpert MRSA Assay (FDA approved for NASAL specimens only), is one component of a comprehensive MRSA colonization surveillance program. It is not intended to diagnose MRSA infection nor to guide or monitor treatment for MRSA infections.     Coagulation Studies: No results for input(s): LABPROT, INR in the last 72 hours.  Urinalysis: No results for input(s): COLORURINE, LABSPEC, PHURINE, GLUCOSEU, HGBUR, BILIRUBINUR, KETONESUR, PROTEINUR, UROBILINOGEN, NITRITE, LEUKOCYTESUR in the last 72 hours.  Invalid input(s): APPERANCEUR    Imaging: No results found.   Medications:   . feeding supplement (VITAL AF 1.2 CAL) 1,000 mL (12/10/15 0400)  . fentaNYL infusion INTRAVENOUS 250 mcg/hr (12/10/15 0249)  . lactated ringers 100 mL/hr at 12/10/15 0603  . nitroGLYCERIN    . norepinephrine (LEVOPHED) Adult infusion 1 mcg/min (12/06/15 2312)   . sodium chloride    Intravenous Once  . antiseptic oral rinse  7 mL Mouth Rinse QID  . bisacodyl  10 mg Rectal Once  . chlorhexidine gluconate  15 mL Mouth Rinse BID  . feeding supplement (PRO-STAT SUGAR FREE 64)  30 mL Per Tube QID  . insulin aspart  0-15 Units Subcutaneous Q4H  . insulin regular  8 Units Subcutaneous Once  . ipratropium-albuterol  3 mL Nebulization  Q6H  . metoprolol tartrate  12.5 mg Per Tube BID  . pantoprazole (PROTONIX) IV  40 mg Intravenous Daily  . piperacillin-tazobactam (ZOSYN)  IV  3.375 g Intravenous 3 times per day  . sodium chloride flush  3 mL Intravenous Q12H  . vancomycin  750 mg Intravenous Q36H   acetaminophen **OR** [DISCONTINUED] acetaminophen, albuterol, bisacodyl, fentaNYL (SUBLIMAZE) injection, fentaNYL (SUBLIMAZE) injection, heparin, hydrALAZINE, labetalol, metoprolol, midazolam, ondansetron **OR** ondansetron (ZOFRAN) IV, sennosides, sodium chloride flush  Assessment/ Plan:  Mr. Stephen Camacho is a 70 y.o. black  male with hypertension, hyperlipidemia, diabetes mellitus type II, coronary artery disease status post 2 vessel CABG, peripheral arterial disease status post right above-the-knee amputation, seizure disorder, lung cancer, prostate cancer, history of CVA, who [facility] for ischemic left lower extremity. Hospital course complicated by acute renal failure, acute respiratory failure, new CVAs, myocardial infarction.  1. Acute renal failure:  ATN from contrast exposure. nonoliguric urine output. renal function stable. Baseline creatinine of 0.83. Electrolytes currently at goal.  - No acute indication for dialysis at this time. Continue monitor closely for dialysis need. Nonoliguric urine output. Monitor volume status, respiratory status, urine output, electrolytes and kidney function - Renally dose all medications - IVF LR @ 137m/hr  2. Acute respiratory failure: Status post intubation and on mechanical ventilation. With acute myocardial  infarction and acute CVA - echocardiogram with diastolic failure.  - Appreciate cardiology and pulmonary input.   3. Ischemic left lower extremity: peripheral vascular disease: and now with acute DVT.  - Appreciate vascular input.   4. Anemia with renal failure: PRBC transfusion 2/14. Hemoglobin now 8.7. Platelets trending upward, 105 today.    LOS: 5 Laticia Vannostrand 2/15/20179:18 AM

## 2015-12-10 NOTE — Progress Notes (Signed)
Hospital Problem List     Active Problems:   Ischemic leg   Acute respiratory failure (HCC)   Ischemia of lower extremity   Altered mental status   Hematoma of neck   Occlusion of arterial bypass graft (HCC)   NSTEMI (non-ST elevated myocardial infarction) (HCC)   Severe sepsis [facility])    Patient Profile:   70 y.o. 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 Found to have occluded left femoropopliteal bypass graft and underwent thrombectomy on 12/06/2015. Developed septic shock and multi-organ failure afterwards. Cardiology consulted on 12/07/2015 for NSTEMI.  Subjective   Remains intubated and sedated. No current requirement for pressors. Did have 10 beats NSVT this AM.  Inpatient Medications    . sodium chloride   Intravenous Once  . antiseptic oral rinse  7 mL Mouth Rinse QID  . bisacodyl  10 mg Rectal Once  . chlorhexidine gluconate  15 mL Mouth Rinse BID  . feeding supplement (PRO-STAT SUGAR FREE 64)  30 mL Per Tube QID  . insulin aspart  0-15 Units Subcutaneous Q4H  . insulin regular  8 Units Subcutaneous Once  . ipratropium-albuterol  3 mL Nebulization Q6H  . metoprolol tartrate  12.5 mg Per Tube BID  . pantoprazole (PROTONIX) IV  40 mg Intravenous Daily  . piperacillin-tazobactam (ZOSYN)  IV  3.375 g Intravenous 3 times per day  . vancomycin  750 mg Intravenous Q36H    Vital Signs    Filed Vitals:   12/10/15 0700 12/10/15 0800 12/10/15 0841 12/10/15 0900  BP: 120/69 132/70    Pulse: 92 98    Temp:  98.4 F (36.9 C)    TempSrc:  Axillary    Resp: 13     Height:      Weight:     174 lb 6.1 oz (79.1 kg)  SpO2: 99% 100% 98%     Intake/Output Summary (Last 24 hours) at 12/10/15 0958 Last data filed at 12/10/15 9470  Gross per 24 hour  Intake 4742.55 ml  Output   2250 ml  Net 2492.55 ml   Filed Weights   12/08/15 0500 12/09/15 0553 12/10/15 0900  Weight: 163 lb 9.3 oz (74.2 kg) 171 lb 4.8 oz (77.7 kg) 174 lb 6.1 oz (79.1 kg)    Physical Exam    General: Elderly African American male currently intubated. Appearing in no acute distress. Head: Normocephalic, atraumatic.  Neck: Bilateral carotid bruits present, JVD not elevated. Lungs:  Resp regular and unlabored, CTA without wheezing or rales. Heart: RRR, S1, S2, no S3, S4, or murmur; no rub. Abdomen: Soft, non-tender, non-distended with normoactive bowel sounds. No hepatomegaly. No rebound/guarding. No obvious abdominal masses. Extremities: No clubbing, cyanosis, or edema. Right AKA. Left leg cold to touch, no dorsalis pedis pulse appreciated. Neuro: Remains intubated and sedated.  Psych: Unable to be assessed.  Labs    CBC  Recent Labs  12/09/15 2034 12/10/15 0816  WBC 18.5* 18.6*  NEUTROABS 16.2* 16.6*  HGB 8.2* 8.7*  HCT 25.0* 26.6*  MCV 84.1 84.5  PLT 86* 962*   Basic Metabolic Panel  Recent Labs  12/08/15 0533  12/09/15 0427 12/10/15 0442  NA 140  < > 140 143  K 4.0  < > 4.1 4.0  CL 105  < > 105 107  CO2 29  < > 31 30  GLUCOSE 181*  < > 180* 188*  BUN 40*  < > 38* 39*  CREATININE 3.07*  < > 2.66* 2.19*  CALCIUM 7.0*  < > 7.1* 7.7*  MG 1.6*  --   --   --   PHOS 4.7*  --  4.3  --   < > = values in this interval not displayed. Liver Function Tests  Recent Labs  12/09/15 0427  ALBUMIN 1.9*   Cardiac Enzymes  Recent Labs  12/07/15 1418  CKTOTAL 104    Telemetry    NSR, HR in 80's - 110's. 10 beats of NSVT.  ECG    No new tracings.   Cardiac Studies and Radiology    Dg Chest 1 View: 12/07/2015  CLINICAL DATA:  Hypoxia EXAM: CHEST 1 VIEW COMPARISON:  December 06, 2015 FINDINGS: Endotracheal tube tip is 4.1 cm above the carina. Nasogastric tube tip and side port are in the stomach. Central catheter tip is in the superior vena cava. No pneumothorax. There is postoperative change in the medial aspect of the left upper lobe. There is also postoperative change in the medial right upper lobe. There is mild scarring in the right base. There is no edema or consolidation. The heart size and pulmonary vascularity are within normal limits. No adenopathy. IMPRESSION: Postoperative change in both upper lobes. No edema or consolidation. Tube and catheter positions as described without pneumothorax. Electronically Signed   By: Lowella Grip III M.D.   On: 12/07/2015 08:39   Ct Head Wo Contrast: 12/07/2015  CLINICAL DATA:  Encephalopathy. Soft tissue swelling in the neck. Occlusion of lower extremity graft. EXAM: CT HEAD WITHOUT CONTRAST CT NECK WITHOUT CONTRAST TECHNIQUE: Contiguous axial images were obtained from the base of the skull through the vertex without contrast. Multidetector CT imaging of the neck was performed using the standard protocol without intravenous contrast. COMPARISON:  CT head without contrast 10/27/2015. FINDINGS: CT HEAD FINDINGS Extensive periventricular and subcortical white matter disease is again noted. The basal ganglia are intact. Hypoattenuation within the internal capsule is not significantly changed. The insular ribbon is within normal limits bilaterally. There is loss of gray-white differentiation in the right PCA distribution suggesting an acute/subacute infarct. No associated hemorrhage is present. Bilateral cerebellar infarcts are also suggested. The fourth ventricle is intact. There is no hydrocephalus. No significant extra-axial fluid collection is present. The paranasal sinuses and mastoid air cells are clear. The calvarium is intact. CT NECK FINDINGS A right-sided catheter is in place. The catheter appears to be extraluminal in the neck to the  level of the thoracic inlet. The distal tip may be in the SVC. Diffuse subcutaneous stranding is more prominent right than left. There is asymmetric enlargement of the right sternocleidomastoid muscle and strap muscles. No discrete hyperdense fluid collection is present. Asymmetric subcutaneous edema extends over the right chest. There is no significant fluid collection in the superior mediastinum. The patient is intubated. An NG tube is in place. Atherosclerotic calcifications are present at the aortic arch and carotid bifurcations. Advanced degenerative changes are present throughout the cervical spine with mild rightward curvature and extensive facet disease. IMPRESSION: 1. 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 2. Otherwise stable  extensive white matter disease. 3. The right neck venous catheter appears to be extra luminal at least to the thoracic inlet. 4. Asymmetric subcutaneous and intramuscular edema and possibly hemorrhage within the right neck and chest. The extraluminal position of the catheter may be contributing. 5. No discrete fluid collection or hemorrhage in the neck. 6. Atherosclerosis. These results were called by telephone at the time of interpretation on 12/07/2015 at 10:02 am to Dr. Mortimer Fries , who verbally acknowledged these results. Electronically Signed   By: San Morelle M.D.   On: 12/07/2015 10:04    Echocardiogram: 12/07/2015 Study Conclusions - Left ventricle: The cavity size was normal. There was mild concentric hypertrophy. Systolic function was normal. The estimated ejection fraction was in the range of 55% to 60%. Images were inadequate for LV wall motion assessment. Doppler parameters are consistent with abnormal left ventricular relaxation (grade 1 diastolic dysfunction).  Assessment & Plan    1. NSTEMI - troponin values peaked at 62.60 on 12/07/2015. -  does have a history of severe  underlying coronary artery disease, s/p CABG w/ LIMA to LAD and SVG to OM in 2012 and DES LCx and OM. Last ischemic evaluation was NST in 05/2015 with no significant ischemia. - Echo this admission shows preserved EF of 55-60% with Grade 1 DD. - Management options are limited at this point from a cardiac standpoint. Continue ASA and BB. - No heparin infusion given acute CVA, recent hemorrhage of the right neck, and severe anemia (Hgb 4.8 on 12/06/2015 --> has received 6 units pRBC's)   2. NSVT - 10 beats noted on telemetry this morning. - K+ normal. Will check Mg. - continue BB. Would not initiate Amiodarone at this time unless these episodes become more frequent.  3. PVD/ Ischemic left leg - s/p endovascular intervention on 12/06/2015. - Left leg below the knee still seems to be ischemic. Not sure if he is currently stable enough for amputation.  - Vascular surgery following  4. Acute CVA - New nonhemorrhagic infarcts involving the right PCA territory and bilateral cerebellum, left greater than right seen on recent imaging this admission. - possibly indicates embolic phenomenon. Would not be a candidate for full-dose anticoagulation at this time due to multiple medical co-morbidities.  5. Type 2 DM - per admitting team  6. Septic shock  - developed hypotension, bradycardia, anemia, and respiratory distress following his procedure on 12/06/2015  - currently off pressors. Hgb improved to 8.7 on 12/10/2015 after receiving 6 units pRBC's this admission.  7. Emphysema/COPD/Right Lung Carcinoma - Prior history of smoking. Bullae seen on CT scan August 2016, worse on the right than the left. - s/p right lobectomy  8. Acute renal failure - creatinine improved to 2.19 on 12/10/2015 -  Nephrology following.   Signed, Erma Heritage , PA-C 9:58 AM 12/10/2015 Pager: [phone]   Attending Note Patient seen and examined, agree with detailed note above,  Patient presentation and plan  discussed on rounds.   Presenting with septic shock, occlusion of left them pop bypass graft Intubated, sedated Several transfusions with improved blood count Improved renal function, creatinine down to 2 Making good urine Blood pressure stable, hypotension has resolved Currently not on anticoagulation given recent stroke, anemia  Telemetry showing several short runs of nonsustained VT, 10 beats Echocardiogram reviewed independently by myself showing normal ejection fraction  Very high troponin on arrival likely secondary to severe hypotension, bradycardia, sepsis in the first 24-48 hours with underlying coronary artery disease.   Not  a good candidate for cardiac catheterization prior to possible amputation as intervention would require the start of dual antiplatelet therapy.  Current situation is critical   Greater than 50% was spent in counseling and coordination of care with patient Total encounter time 35 minutes or more   Signed: Esmond Plants  M.D., Ph.D. [facility]

## 2015-12-10 NOTE — Consult Note (Signed)
PULMONARY / CRITICAL CARE MEDICINE   Name: Stephen Camacho MRN: [id number] DOB: 1946/06/29    ADMISSION DATE:  12/05/2015   CONSULTATION DATE: 12/06/2015   REFERRING MD:  Delana Meyer  CHIEF COMPLAINT: Acute respiratory failure, left fem-pop bypass graft occlusion and SOB  REVIEW OF SYSTEMS:   Unable to obtain, patient is intubated and sedated  SUBJECTIVE:  Off levophed, started having increase UOP, and did not require any dialysis. Now with excessive UOP, possible post ATN diuresis, had severe agitation overnight.  VITAL SIGNS: BP 116/69 mmHg  Pulse 102  Temp(Src) 99.8 F (37.7 C) (Oral)  Resp 12  Ht '6\' 4"'$  (1.93 m)  Wt 174 lb 6.1 oz (79.1 kg)  BMI 21.24 kg/m2  SpO2 100%  HEMODYNAMICS:    VENTILATOR SETTINGS: Vent Mode:  [-] PRVC FiO2 (%):  [30 %] 30 % Set Rate:  [13 bmp] 13 bmp Vt Set:  [500 mL] 500 mL PEEP:  [5 cmH20] 5 cmH20  INTAKE / OUTPUT: I/O last 3 completed shifts: In: 6701.6 [I.V.:4451.4; Blood:660; NG/GT:1240.2; IV Piggyback:350] Out: 3975 [TOIZT:2458]  PHYSICAL EXAMINATION: General: Frail looking male,on vent intubated Neuro: no significant purposeful movement HEENT: PERRLA, Oral mucosa with moderate amount of thick secretions  Cardiovascular: ST, s1/s2, no MRG Lungs:  Labored, bilateral airflow, scattered rhonchi in all lung fields Abdomen: Soft, NT/ND, +BS X4 Musculoskeletal:  Right AKA, left leg with well healed bypass scar Extremities: left leg cold; no palpable pulses, +femoral pulse Skin: Cold; mild discoloration in left leg  LABS:  BMET  Recent Labs Lab 12/09/15 0107 12/09/15 0427 12/10/15 0442  NA 140 140 143  K 4.1 4.1 4.0  CL 106 105 107  CO2 '30 31 30  '$ BUN 36* 38* 39*  CREATININE 2.73* 2.66* 2.19*  GLUCOSE 163* 180* 188*    Electrolytes  Recent Labs Lab 12/07/15 0622 12/08/15 0533 12/09/15 0107 12/09/15 0427 12/10/15 0442  CALCIUM 6.9* 7.0* 7.0* 7.1* 7.7*  MG 1.3* 1.6*  --   --  2.0  PHOS 4.8* 4.7*  --  4.3  --      CBC  Recent Labs Lab 12/09/15 0427 12/09/15 2034 12/10/15 0816  WBC 16.9* 18.5* 18.6*  HGB 6.3* 8.2* 8.7*  HCT 19.8* 25.0* 26.6*  PLT 90* 86* 105*    Coag's  Recent Labs Lab 12/05/15 0344  12/05/15 2053 12/06/15 0332 12/06/15 0832  APTT 41*  < > 132* 79* 72*  INR 1.99  --   --   --   --   < > = values in this interval not displayed.  Sepsis Markers  Recent Labs Lab 12/06/15 1830  12/07/15 0304 12/07/15 0623 12/07/15 1418  LATICACIDVEN  --   < > 3.2* 4.8* 3.0*  PROCALCITON 0.54  --   --   --   --   < > = values in this interval not displayed.  ABG  Recent Labs Lab 12/06/15 1935 12/06/15 2316 12/07/15 0854  PHART 7.52* 7.43 7.49*  PCO2ART 26* 39 36  PO2ART 168* 173* 179*    Liver Enzymes  Recent Labs Lab 12/05/15 0344 12/09/15 0427  AST 17  --   ALT 11*  --   ALKPHOS 83  --   BILITOT 0.4  --   ALBUMIN 3.9 1.9*    Cardiac Enzymes  Recent Labs Lab 12/06/15 2007 12/07/15 0622 12/07/15 0658  TROPONINI 22.40* 62.60* 57.81*    Glucose  Recent Labs Lab 12/10/15 0557 12/10/15 0744 12/10/15 1019 12/10/15 1403 12/10/15 1801 12/10/15 2004  GLUCAP 152* 132* 160* 100* 203* 170*    Imaging No results found.  STUDIES:    CULTURES: 02/11 Blood x2>> Urine>> Respiratory>>heavy growth staph  ANTIBIOTICS: Piperacillin-tazobactam 02/11> Vancomycin 02/11>  SIGNIFICANT EVENTS: 02/07>ED with SOB and left calf pain>treated and released 02/09>ED with cold left foot and calf pain>CT angio>complete occlusion of the left fem-pop bypass graft>admitted 02/11>OR for angiogram with thrombolysis, thrombectomy of left peroneal artery, tibioperoneal trunk, popliteal artery, and femoral to popliteal bypass graft and percutaneous transluminal angioplasty of the left peroneal artery and distal bypass anastomosis and popliteal artery Post-op: residual thrombosis of femoral to popliteal bypass graft and all tibial vessels; acute respiratory  failure>Intubated   LINES/TUBES: Right IJ tlc 02/10> ETT 02/11> L Fem vascath 2/13>>  DISCUSSION: 70 YO male with acute complete occlusion of the  left fem-pop bypass graft s/p thrombolysis/thrombectomy/percuteneous transluminal angioplasty with residual thrombosis, sepsis secondary to possible necrosis from left leg thrombosis, and  acute hypoxic respiratory failure secondary to sepsis and complicated by acute NSTEMI and worsening renal fcn, now slowly improving, still critically ill due to left leg. .   ASSESSMENT / PLAN:  PULMONARY A: Acute hypoxic respiratory failure Possible aspiration pneumonia-Copious amount of secretions noted in airway during intubation-now with improvement.  Severe metabolic acidosis - resolving well H/O Lung cancer s/p right lung lobectomy P:   -Full vent support-Settings: PRVC, wean as tolerated.  -ABG pending -VAP prevention protocol -F/U CXR as needed -Albuterol and Duoneb prn -Abx as above -F/U cultures - respiratory now with stap, currently being treated.   CARDIOVASCULAR-NSTEMI A:  Bradycardia-Resolved; Now in ST Septic shock H/O hypertension NSTEMI P:  -IV fluid boluses until CVP GE12 -Levophed gtt and titrate to MAP goal >65 -Hemodynamics per ICU protocol -Hold home BP meds  -cardiology following - rec low dose ASA -check ECHO>>EF 55-60%, normal LV wall motion  RENAL A:   AKI secondary procedural IV dye and volume depletion; baseline creatinine 0.83 P:   -IV fluids, LR -Trend creatinine -Renally dose meds -nephro following - vascath placed, no dialysis at this time due to increase UOP.   GASTROINTESTINAL A:   Constipation P:   -PPI for GI prophylaxis -Monitor BMs -cont with TF  HEMATOLOGIC A:   Left leg ischemia-Complete Occlusion of the left fem-pop bypass graft s/p thrombolysis/PTA with residual thrombosis Right neck hematoma s/p TLC placement Severe PVD s/p Right AKA P:  -Vascular surgery following - plan for  possible surgical intervention of LLE, awaiting cardiology input.  -Anticoagulation per vasc surgery -Monitor left limb for re-thrombosis -Monitor for bleeding-transfuse as needed, -DVT - heparin on hold, due to neck hematoma  INFECTIOUS A:   Septic shock-likely source is left leg ischemia/necrosis from massive thrombus P:   -Antibiotics as above -F/U cultures -IV fluids and pressure to keep MAP>65 -F/U lactic acid level and procalcitonin  ENDOCRINE A:   Type 2 diabetes mellitus   P:   -Blood glucose monitoring with SSI coverage -Hold oral hypoglycemic agents  NEUROLOGIC A:   Acute metabolic encephalopathy H/o CVA H/O Seizures-Not on home seizure meds P:   RASS goal: -2 -Fentanyl infusion and versed  Prn for vent sedation -Monitor for seizure activity   The Patient requires high complexity decision making for assessment and support, frequent evaluation and titration of therapies, application of advanced monitoring technologies and extensive interpretation of multiple databases.  Critical Care Time devoted to patient care services described in this note is 35 minutes.   Overall, patient is critically ill, prognosis is guarded.  Patient with Multiorgan failure and at high risk for cardiac arrest and death.    Vilinda Boehringer, MD [facility] Pager [phone] (please enter 7-digits) On Call Pager - [phone] (please enter 7-digits)

## 2015-12-10 NOTE — Progress Notes (Signed)
Nutrition Follow-up     INTERVENTION:   EN: recommend continuing TF at current goal rate of 45 ml/hr, Prostat 4 times daily, minimal free water flushes as pt on IV fluids   NUTRITION DIAGNOSIS:   Inadequate oral intake related to inability to eat as evidenced by NPO status.  GOAL:   Patient will meet greater than or equal to 90% of their needs  MONITOR:    (Energy Intake, Electrolyte and renal Profile, Anthropometrics, Digestive System, Glucose Profile, Pulmonary Profile)  REASON FOR ASSESSMENT:   Ventilator, Consult Enteral/tube feeding initiation and management  ASSESSMENT:   Per MD note: Pt admitted with acute complete occlusion of the left fem-pop bypass graft s/p thrombolysis/thrombectomy/percuteneous transluminal angioplasty with residual thrombosis, sepsis secondary to possible necrosis from left leg thrombosis, and acute hypoxic respiratory failure secondary to sepsis and complicated by acute NSTEMI.  Pt remains on vent  EN: tolerating Vital AF 1.2 at rate of 45 ml/hr, Prostat 4 times daily, free water 30 mL q 4 hours  Digestive System: abdomen distended, BS hypoactive, +small BM this AM    Recent Labs Lab 12/07/15 0622 12/08/15 0533 12/09/15 0107 12/09/15 0427 12/10/15 0442  NA 141 140 140 140 143  K 4.0 4.0 4.1 4.1 4.0  CL 105 105 106 105 107  CO2 '24 29 30 31 30  '$ BUN 35* 40* 36* 38* 39*  CREATININE 2.78* 3.07* 2.73* 2.66* 2.19*  CALCIUM 6.9* 7.0* 7.0* 7.1* 7.7*  MG 1.3* 1.6*  --   --  2.0  PHOS 4.8* 4.7*  --  4.3  --   GLUCOSE 212* 181* 163* 180* 188*    Glucose Profile:  Recent Labs  12/10/15 0744 12/10/15 1019 12/10/15 1403  GLUCAP 132* 160* 100*   Meds: ss novolog, LR at 100 ml/hr  Urine Volume: UOP 2425 mL  Height:   Ht Readings from Last 1 Encounters:  12/05/15 '6\' 4"'$  (1.93 m)    Weight:   Wt Readings from Last 1 Encounters:  12/10/15 174 lb 6.1 oz (79.1 kg)    BMI:  Body mass index is 21.24 kg/(m^2).  Estimated  Nutritional Needs:   Kcal:  1738kcals, using [employer] Equation with ABW of 65kg, (BEE: 1506kcals, Temp: 37.7, Ve: 6.5)  Protein:  136-182g protein (1.5-2.0g/kg) using IBW of 91kg  Fluid:  2275-2735m of fluid (25-354mkg) using IBW of 91kg  HIGH Care Level  CaBorgWarnerS, RD, LDN ([phone]  [phone]/On-Call Pager

## 2015-12-10 NOTE — Progress Notes (Signed)
Pharmacy Antibiotic Note  Stephen Camacho is a 70 y.o. male admitted on 12/05/2015 with ischemic leg.  Pharmacy has been consulted for Vancomycin and Zosyn dosing.   Plan: Will continue patient on  Vancomycin 1000 mg IV q24h. Will follow renal function and adjust as appropriate.   Continue Zosyn 3.375 g IV q8h per EI protocol.  Height: '6\' 4"'$  (193 cm) Weight: 174 lb 6.1 oz (79.1 kg) IBW/kg (Calculated) : 86.8  Temp (24hrs), Avg:99.5 F (37.5 C), Min:98.4 F (36.9 C), Max:100.4 F (38 C)   Recent Labs Lab 12/06/15 1508 12/06/15 2007 12/07/15 0304 12/07/15 0622 12/07/15 9518 12/07/15 8416 12/07/15 1418 12/08/15 0533 12/09/15 0107 12/09/15 0427 12/09/15 2034 12/10/15 0442 12/10/15 0816  WBC  --  10.5  --   --   --  17.1*  --   --   --  16.9* 18.5*  --  18.6*  CREATININE  --  2.69*  --  2.78*  --   --   --  3.07* 2.73* 2.66*  --  2.19*  --   LATICACIDVEN 17.8* 7.5* 3.2*  --  4.8*  --  3.0*  --   --   --   --   --   --   VANCOTROUGH  --   --   --   --   --   --   --   --   --   --   --  9*  --     Estimated Creatinine Clearance: 35.1 mL/min (by C-G formula based on Cr of 2.19).    No Known Allergies  Antimicrobials this admission: Zosyn 2/11 >>  Vancomycin 2/11 >>    Microbiology results: Results for orders placed or performed during the hospital encounter of 12/05/15  Culture, respiratory (NON-Expectorated)     Status: None   Collection Time: 12/06/15  4:05 PM  Result Value Ref Range Status   Specimen Description NASAL SWAB  Final   Special Requests NONE  Final   Gram Stain RARE WBC SEEN FEW GRAM POSITIVE COCCI   Final   Culture HEAVY GROWTH STAPHYLOCOCCUS AUREUS  Final   Report Status 12/09/2015 FINAL  Final   Organism ID, Bacteria STAPHYLOCOCCUS AUREUS  Final      Susceptibility   Staphylococcus aureus - MIC*    CIPROFLOXACIN 2 INTERMEDIATE Intermediate     GENTAMICIN <=0.5 SENSITIVE Sensitive     OXACILLIN <=0.25 SENSITIVE Sensitive     TETRACYCLINE <=1  SENSITIVE Sensitive     VANCOMYCIN 1 SENSITIVE Sensitive     TRIMETH/SULFA <=10 SENSITIVE Sensitive     RIFAMPIN <=0.5 SENSITIVE Sensitive     Inducible Clindamycin NEGATIVE Sensitive     * HEAVY GROWTH STAPHYLOCOCCUS AUREUS  Culture, blood (Routine X 2) w Reflex to ID Panel     Status: None (Preliminary result)   Collection Time: 12/07/15 12:09 AM  Result Value Ref Range Status   Specimen Description BLOOD BLOOD RIGHT HAND  Final   Special Requests BOTTLES DRAWN AEROBIC AND ANAEROBIC 10CC  Final   Culture NO GROWTH 3 DAYS  Final   Report Status PENDING  Incomplete  Culture, blood (Routine X 2) w Reflex to ID Panel     Status: None (Preliminary result)   Collection Time: 12/07/15 12:09 AM  Result Value Ref Range Status   Specimen Description BLOOD BLOOD LEFT HAND  Final   Special Requests BOTTLES DRAWN AEROBIC AND ANAEROBIC 10CC  Final   Culture NO GROWTH 3 DAYS  Final  Report Status PENDING  Incomplete  Urine culture     Status: None   Collection Time: 12/07/15 12:11 AM  Result Value Ref Range Status   Specimen Description URINE, CLEAN CATCH  Final   Special Requests NONE  Final   Culture NO GROWTH 1 DAY  Final   Report Status 12/08/2015 FINAL  Final  MRSA PCR Screening     Status: None   Collection Time: 12/08/15  9:12 AM  Result Value Ref Range Status   MRSA by PCR NEGATIVE NEGATIVE Final    Comment:        The GeneXpert MRSA Assay (FDA approved for NASAL specimens only), is one component of a comprehensive MRSA colonization surveillance program. It is not intended to diagnose MRSA infection nor to guide or monitor treatment for MRSA infections.     Pharmacy will continue to monitor and adjust per consult.  Currie Paris, PharmD 12/10/2015

## 2015-12-11 LAB — BASIC METABOLIC PANEL
ANION GAP: 3 — AB (ref 5–15)
Anion gap: 10 (ref 5–15)
Anion gap: 5 (ref 5–15)
BUN: 35 mg/dL — ABNORMAL HIGH (ref 6–20)
BUN: 31 mg/dL — ABNORMAL HIGH (ref 6–20)
BUN: 36 mg/dL — AB (ref 6–20)
CALCIUM: 7.5 mg/dL — AB (ref 8.9–10.3)
CALCIUM: 7.5 mg/dL — AB (ref 8.9–10.3)
CALCIUM: 7.7 mg/dL — AB (ref 8.9–10.3)
CHLORIDE: 110 mmol/L (ref 101–111)
CO2: 23 mmol/L (ref 22–32)
CO2: 28 mmol/L (ref 22–32)
CO2: 29 mmol/L (ref 22–32)
CREATININE: 1.93 mg/dL — AB (ref 0.61–1.24)
Chloride: 109 mmol/L (ref 101–111)
Chloride: 110 mmol/L (ref 101–111)
Creatinine, Ser: 1.99 mg/dL — ABNORMAL HIGH (ref 0.61–1.24)
Creatinine, Ser: 2.06 mg/dL — ABNORMAL HIGH (ref 0.61–1.24)
GFR calc Af Amer: 37 mL/min — ABNORMAL LOW (ref >60)
GFR calc non Af Amer: 31 mL/min — ABNORMAL LOW (ref >60)
GFR calc non Af Amer: 34 mL/min — ABNORMAL LOW (ref >60)
GFR, EST AFRICAN AMERICAN: 36 mL/min — AB (ref >60)
GFR, EST AFRICAN AMERICAN: 39 mL/min — AB (ref >60)
GFR, EST NON AFRICAN AMERICAN: 32 mL/min — AB (ref >60)
GLUCOSE: 264 mg/dL — AB (ref 65–99)
Glucose, Bld: 167 mg/dL — ABNORMAL HIGH (ref 65–99)
Glucose, Bld: 234 mg/dL — ABNORMAL HIGH (ref 65–99)
POTASSIUM: 4 mmol/L (ref 3.5–5.1)
Potassium: 3.8 mmol/L (ref 3.5–5.1)
Potassium: 3.6 mmol/L (ref 3.5–5.1)
SODIUM: 142 mmol/L (ref 135–145)
SODIUM: 142 mmol/L (ref 135–145)
Sodium: 143 mmol/L (ref 135–145)

## 2015-12-11 LAB — GLUCOSE, CAPILLARY
GLUCOSE-CAPILLARY: 153 mg/dL — AB (ref 65–99)
GLUCOSE-CAPILLARY: 201 mg/dL — AB (ref 65–99)
GLUCOSE-CAPILLARY: 189 mg/dL — AB (ref 65–99)
GLUCOSE-CAPILLARY: 209 mg/dL — AB (ref 65–99)
Glucose-Capillary: 198 mg/dL — ABNORMAL HIGH (ref 65–99)
Glucose-Capillary: 205 mg/dL — ABNORMAL HIGH (ref 65–99)
Glucose-Capillary: 190 mg/dL — ABNORMAL HIGH (ref 65–99)

## 2015-12-11 LAB — PHOSPHORUS: PHOSPHORUS: 2.8 mg/dL (ref 2.5–4.6)

## 2015-12-11 LAB — C DIFFICILE QUICK SCREEN W PCR REFLEX
C DIFFICILE (CDIFF) INTERP: NEGATIVE
C DIFFICLE (CDIFF) ANTIGEN: NEGATIVE
C Diff toxin: NEGATIVE

## 2015-12-11 LAB — MAGNESIUM
MAGNESIUM: 2 mg/dL (ref 1.7–2.4)
Magnesium: 2 mg/dL (ref 1.7–2.4)

## 2015-12-11 MED ORDER — MIDAZOLAM HCL 2 MG/2ML IJ SOLN
INTRAMUSCULAR | Status: AC
  Filled 2015-12-11: qty 6

## 2015-12-11 MED ORDER — AMIODARONE HCL IN DEXTROSE 360-4.14 MG/200ML-% IV SOLN
30.0000 mg/h | INTRAVENOUS | Status: DC
  Administered 2015-12-12 – 2015-12-14 (×5): 30 mg/h via INTRAVENOUS
  Filled 2015-12-11 (×8): qty 200

## 2015-12-11 MED ORDER — SODIUM CHLORIDE 0.9 % IV BOLUS (SEPSIS)
500.0000 mL | Freq: Once | INTRAVENOUS | Status: AC
  Administered 2015-12-11: 500 mL via INTRAVENOUS

## 2015-12-11 MED ORDER — METOPROLOL TARTRATE 50 MG PO TABS
50.0000 mg | ORAL_TABLET | Freq: Two times a day (BID) | ORAL | Status: DC
  Administered 2015-12-11 – 2015-12-13 (×4): 50 mg
  Filled 2015-12-11 (×5): qty 1

## 2015-12-11 MED ORDER — AMIODARONE LOAD VIA INFUSION
150.0000 mg | Freq: Once | INTRAVENOUS | Status: DC
  Filled 2015-12-11: qty 83.34

## 2015-12-11 MED ORDER — METOPROLOL TARTRATE 1 MG/ML IV SOLN
5.0000 mg | Freq: Once | INTRAVENOUS | Status: AC
  Administered 2015-12-11: 5 mg via INTRAVENOUS

## 2015-12-11 MED ORDER — CEFAZOLIN SODIUM-DEXTROSE 2-3 GM-% IV SOLR
2.0000 g | INTRAVENOUS | Status: AC
  Filled 2015-12-11: qty 50

## 2015-12-11 MED ORDER — AMIODARONE IV BOLUS ONLY 150 MG/100ML
INTRAVENOUS | Status: AC
  Administered 2015-12-11: 150 mg
  Filled 2015-12-11: qty 100

## 2015-12-11 MED ORDER — METOPROLOL TARTRATE 1 MG/ML IV SOLN
INTRAVENOUS | Status: AC
  Administered 2015-12-11: 5 mg via INTRAVENOUS
  Filled 2015-12-11: qty 25

## 2015-12-11 MED ORDER — AMIODARONE HCL IN DEXTROSE 360-4.14 MG/200ML-% IV SOLN
60.0000 mg/h | INTRAVENOUS | Status: AC
  Administered 2015-12-11: 60 mg/h via INTRAVENOUS
  Filled 2015-12-11: qty 200

## 2015-12-11 MED ORDER — PHENYLEPHRINE HCL 10 MG/ML IJ SOLN
30.0000 ug/min | INTRAVENOUS | Status: DC
  Filled 2015-12-11: qty 1

## 2015-12-11 NOTE — Progress Notes (Signed)
eLink Physician-Brief Progress Note Patient Name: Stephen Camacho DOB: 1946-08-22 MRN: [id number]   Date of Service  12/11/2015  HPI/Events of Note  Prolonged V-tach, normal BP throughout, mental status unchanged from baseline   eICU Interventions  Gave '5mg'$  IV metoprolol Placed pads for cardioversion Converted back to sinus tach with metoprolol Will check BMET, Mg Give amiodarone overnight      Intervention Category Major Interventions: Arrhythmia - evaluation and management  Simonne Maffucci 12/11/2015, 10:48 PM

## 2015-12-11 NOTE — Progress Notes (Signed)
Nutrition Follow-up   INTERVENTION:   EN: Continue current TF regimen as ordered with minimal free water flushes as pt remains on IVF. Will continue to monitor and assess as needs will likely change s/p surgical intervention of left AKA potentially tomorrow.    NUTRITION DIAGNOSIS:   Inadequate oral intake related to inability to eat as evidenced by NPO status. Being addressed with TF.  GOAL:   Patient will meet greater than or equal to 90% of their needs  MONITOR:    (Energy Intake, Electrolyte and renal Profile, Anthropometrics, Digestive System, Glucose Profile, Pulmonary Profile)  REASON FOR ASSESSMENT:   Ventilator, Consult Enteral/tube feeding initiation and management  ASSESSMENT:   Per MD note: Pt admitted with acute complete occlusion of the left fem-pop bypass graft s/p thrombolysis/thrombectomy/percuteneous transluminal angioplasty with residual thrombosis, sepsis secondary to possible necrosis from left leg thrombosis, and acute hypoxic respiratory failure secondary to sepsis and complicated by acute NSTEMI.  Per MD note, plan for left AKA when pt medically stable, potentially tomorrow. RD notes pt s/p right AKA previously.   Diet Order:    NPO   EN: tolerating Vital 1.2 at 54m/hr with Prostat QID currently, 366mfree water flushes q 4 hours.   Gastrointestinal Profile: Last BM: 12/11/2015   Scheduled Medications:  . antiseptic oral rinse  7 mL Mouth Rinse QID  . bisacodyl  10 mg Rectal Once  . chlorhexidine gluconate  15 mL Mouth Rinse BID  . feeding supplement (PRO-STAT SUGAR FREE 64)  30 mL Per Tube QID  . free water  30 mL Per Tube 6 times per day  . insulin aspart  0-15 Units Subcutaneous Q4H  . insulin regular  8 Units Subcutaneous Once  . ipratropium-albuterol  3 mL Nebulization Q6H  . metoprolol tartrate  50 mg Per Tube BID  . pantoprazole sodium  40 mg Per Tube Daily  . piperacillin-tazobactam (ZOSYN)  IV  3.375 g Intravenous 3 times per day     Continuous Medications:  . feeding supplement (VITAL AF 1.2 CAL) 1,000 mL (12/10/15 1645)  . fentaNYL infusion INTRAVENOUS 300 mcg/hr (12/11/15 1034)  . lactated ringers 100 mL/hr at 12/11/15 1126  . nitroGLYCERIN    . norepinephrine (LEVOPHED) Adult infusion 1 mcg/min (12/06/15 2312)     Electrolyte/Renal Profile and Glucose Profile:   Recent Labs Lab 12/08/15 0533  12/09/15 0427 12/10/15 0442 12/10/15 2331 12/11/15 0508  NA 140  < > 140 143 142 142  K 4.0  < > 4.1 4.0 4.0 3.8  CL 105  < > 105 107 109 110  CO2 29  < > '31 30 28 29  '$ BUN 40*  < > 38* 39* 36* 35*  CREATININE 3.07*  < > 2.66* 2.19* 1.99* 2.06*  CALCIUM 7.0*  < > 7.1* 7.7* 7.7* 7.5*  MG 1.6*  --   --  2.0 2.0  --   PHOS 4.7*  --  4.3  --  2.8  --   GLUCOSE 181*  < > 180* 188* 167* 234*  < > = values in this interval not displayed. Protein Profile:  Recent Labs Lab 12/05/15 0344 12/09/15 0427  ALBUMIN 3.9 1.9*    Weight Trend since Admission: Filed Weights   12/09/15 0553 12/10/15 0900 12/11/15 0556  Weight: 171 lb 4.8 oz (77.7 kg) 174 lb 6.1 oz (79.1 kg) 176 lb 5.9 oz (80 kg)    BMI:  Body mass index is 21.48 kg/(m^2).  Estimated Nutritional Needs:   Kcal:  1738kcals, using [employer] Equation with ABW of 65kg, (BEE: 1506kcals, Temp: 37.7, Ve: 6.5)  Protein:  136-182g protein (1.5-2.0g/kg) using IBW of 91kg  Fluid:  2275-2759m of fluid (25-330mkg) using IBW of 91kg   HIGH Care Level  Stephen LuoRD, LDN Pager [phone]/On-Call Pager ([phone]

## 2015-12-11 NOTE — Progress Notes (Signed)
Spoke with Dr. Fletcher Anon regarding patients heart rate and rhythm- metoprolol PRN given- Advised family at bedside not to stimulate patient. Once medication given and family stepped out patient appears more comfortable and rate dropped to 90's. No additional orders given at this time. Family is coming in to speak with Dr Delana Meyer regarding plan of care.

## 2015-12-11 NOTE — Progress Notes (Signed)
Hospital Problem List     Active Problems:   Ischemic leg   Acute respiratory failure (HCC)   Ischemia of lower extremity   Altered mental status   Hematoma of neck   Occlusion of arterial bypass graft (HCC)   NSTEMI (non-ST elevated myocardial infarction) (HCC)   Severe sepsis [facility])   Urinary retention    Patient Profile:   70 y.o. 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 Found to have occluded left femoropopliteal bypass graft and underwent thrombectomy on 12/06/2015. Developed septic shock and multi-organ failure afterwards. Cardiology consulted on 12/07/2015 for NSTEMI.  Subjective   Remains intubated and sedated. He had sinus tachycardia today and was given IV Labetalol.   Inpatient Medications    . antiseptic oral rinse  7 mL Mouth Rinse QID  . bisacodyl  10 mg Rectal Once  . chlorhexidine gluconate  15 mL Mouth Rinse BID  . feeding supplement (PRO-STAT SUGAR FREE 64)  30 mL Per Tube QID  . free water  30 mL Per Tube 6 times per day  . insulin aspart  0-15 Units Subcutaneous Q4H  . insulin regular  8 Units Subcutaneous Once  . ipratropium-albuterol  3 mL Nebulization Q6H  . metoprolol tartrate  50 mg Per Tube BID  . pantoprazole sodium  40 mg Per Tube Daily  . piperacillin-tazobactam (ZOSYN)  IV  3.375 g Intravenous 3 times per day  . vancomycin  1,000 mg Intravenous Q24H    Vital Signs    Filed Vitals:   12/11/15 0556 12/11/15 0600 12/11/15 0800 12/11/15 0900  BP:  124/72 101/52 148/90  Pulse:  97 120 130  Temp:      TempSrc:      Resp:  '14 24 15  '$ Height:      Weight: 176 lb  5.9 oz (80 kg)     SpO2:  100% 100% 100%    Intake/Output Summary (Last 24 hours) at 12/11/15 0949 Last data filed at 12/11/15 0600  Gross per 24 hour  Intake 4028.47 ml  Output   3100 ml  Net 928.47 ml   Filed Weights   12/09/15 0553 12/10/15 0900 12/11/15 0556  Weight: 171 lb 4.8 oz (77.7 kg) 174 lb 6.1 oz (79.1 kg) 176 lb 5.9 oz (80 kg)    Physical Exam    General: Elderly African American male currently intubated. Appearing in no acute distress. Head: Normocephalic, atraumatic.  Neck: Bilateral carotid bruits present, JVD not elevated. Lungs:  Resp regular and unlabored, CTA without wheezing or rales. Heart: RRR, S1, S2, no S3, S4, or murmur; no rub. Abdomen: Soft, non-tender, non-distended with normoactive bowel sounds. No hepatomegaly. No rebound/guarding. No obvious abdominal masses. Extremities: No clubbing, cyanosis, or edema. Right AKA. Left leg cold to touch, no dorsalis pedis pulse appreciated. Neuro: Remains intubated and sedated.  Psych: Unable to be assessed.  Labs    CBC  Recent Labs  12/09/15 2034 12/10/15 0816  WBC 18.5* 18.6*  NEUTROABS 16.2* 16.6*  HGB 8.2* 8.7*  HCT 25.0* 26.6*  MCV 84.1 84.5  PLT 86* 105*  Basic Metabolic Panel  Recent Labs  12/09/15 0427 12/10/15 0442 12/10/15 2331 12/11/15 0508  NA 140 143 142 142  K 4.1 4.0 4.0 3.8  CL 105 107 109 110  CO2 '31 30 28 29  '$ GLUCOSE 180* 188* 167* 234*  BUN 38* 39* 36* 35*  CREATININE 2.66* 2.19* 1.99* 2.06*  CALCIUM 7.1* 7.7* 7.7* 7.5*  MG  --  2.0 2.0  --   PHOS 4.3  --  2.8  --    Liver Function Tests  Recent Labs  12/09/15 0427  ALBUMIN 1.9*   Cardiac Enzymes No results for input(s): CKTOTAL, CKMB, CKMBINDEX, TROPONINI in the last 72 hours.  Telemetry    NSR, HR in 80's - 110's. 10 beats of NSVT.  ECG    No new tracings.   Cardiac Studies and Radiology    Dg Chest 1 View: 12/07/2015  CLINICAL DATA:  Hypoxia EXAM: CHEST 1 VIEW COMPARISON:  December 06, 2015  FINDINGS: Endotracheal tube tip is 4.1 cm above the carina. Nasogastric tube tip and side port are in the stomach. Central catheter tip is in the superior vena cava. No pneumothorax. There is postoperative change in the medial aspect of the left upper lobe. There is also postoperative change in the medial right upper lobe. There is mild scarring in the right base. There is no edema or consolidation. The heart size and pulmonary vascularity are within normal limits. No adenopathy. IMPRESSION: Postoperative change in both upper lobes. No edema or consolidation. Tube and catheter positions as described without pneumothorax. Electronically Signed   By: Lowella Grip III M.D.   On: 12/07/2015 08:39   Ct Head Wo Contrast: 12/07/2015  CLINICAL DATA:  Encephalopathy. Soft tissue swelling in the neck. Occlusion of lower extremity graft. EXAM: CT HEAD WITHOUT CONTRAST CT NECK WITHOUT CONTRAST TECHNIQUE: Contiguous axial images were obtained from the base of the skull through the vertex without contrast. Multidetector CT imaging of the neck was performed using the standard protocol without intravenous contrast. COMPARISON:  CT head without contrast 10/27/2015. FINDINGS: CT HEAD FINDINGS Extensive periventricular and subcortical white matter disease is again noted. The basal ganglia are intact. Hypoattenuation within the internal capsule is not significantly changed. The insular ribbon is within normal limits bilaterally. There is loss of gray-white differentiation in the right PCA distribution suggesting an acute/subacute infarct. No associated hemorrhage is present. Bilateral cerebellar infarcts are also suggested. The fourth ventricle is intact. There is no hydrocephalus. No significant extra-axial fluid collection is present. The paranasal sinuses and mastoid air cells are clear. The calvarium is intact. CT NECK FINDINGS A right-sided catheter is in place. The catheter appears to be extraluminal in the neck to the  level of the thoracic inlet. The distal tip may be in the SVC. Diffuse subcutaneous stranding is more prominent right than left. There is asymmetric enlargement of the right sternocleidomastoid muscle and strap muscles. No discrete hyperdense fluid collection is present. Asymmetric subcutaneous edema extends over the right chest. There is no significant fluid collection in the superior mediastinum. The patient is intubated. An NG tube is in place. Atherosclerotic calcifications are present at the aortic arch and carotid bifurcations. Advanced degenerative changes are present throughout the cervical spine with mild rightward curvature and extensive facet disease. IMPRESSION: 1. 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 2. Otherwise stable extensive white matter disease. 3. The right neck venous catheter appears to be extra luminal at least to the  thoracic inlet. 4. Asymmetric subcutaneous and intramuscular edema and possibly hemorrhage within the right neck and chest. The extraluminal position of the catheter may be contributing. 5. No discrete fluid collection or hemorrhage in the neck. 6. Atherosclerosis. These results were called by telephone at the time of interpretation on 12/07/2015 at 10:02 am to Dr. Mortimer Fries , who verbally acknowledged these results. Electronically Signed   By: San Morelle M.D.   On: 12/07/2015 10:04    Echocardiogram: 12/07/2015 Study Conclusions - Left ventricle: The cavity size was normal. There was mild concentric hypertrophy. Systolic function was normal. The estimated ejection fraction was in the range of 55% to 60%. Images were inadequate for LV wall motion assessment. Doppler parameters are consistent with abnormal left ventricular relaxation (grade 1 diastolic dysfunction).  Assessment & Plan    1. NSTEMI - troponin values peaked at 62.60 on 12/07/2015. -  does have a history of severe  underlying coronary artery disease, s/p CABG w/ LIMA to LAD and SVG to OM in 2012 and DES LCx and OM. Last ischemic evaluation was NST in 05/2015 with no significant ischemia. - Echo this admission shows preserved EF of 55-60% with Grade 1 DD. - Management options are limited at this point from a cardiac standpoint. Continue ASA and BB. - No heparin infusion given acute CVA, recent hemorrhage of the right neck, and severe anemia (Hgb 4.8 on 12/06/2015 --> has received 6 units pRBC's)   2. NSVT - 10 beats noted on telemetry this morning. - K+ normal. Will check Mg. -  Due to sinus tachycardia, I increased the dose of Metoprolol to 50 mg bid.   3. PVD/ Ischemic left leg - s/p endovascular intervention on 12/06/2015. - Left leg below the knee still seems to be ischemic.   - Vascular surgery following. He is at high risk for surgery but that might be the only viable option to save him from multiorgan failure.   4. Acute CVA - New nonhemorrhagic infarcts involving the right PCA territory and bilateral cerebellum, left greater than right seen on recent imaging this admission. - possibly indicates embolic phenomenon. Would not be a candidate for full-dose anticoagulation at this time due to multiple medical co-morbidities.  5. Type 2 DM - per admitting team  6. Septic shock  - developed hypotension, bradycardia, anemia, and respiratory distress following his procedure on 12/06/2015  - currently off pressors. Hgb improved to 8.7 on 12/10/2015 after receiving 6 units pRBC's this admission.  7. Emphysema/COPD/Right Lung Carcinoma - Prior history of smoking. Bullae seen on CT scan August 2016, worse on the right than the left. - s/p right lobectomy  8. Acute renal failure - creatinine improved to 2.19 on 12/10/2015 -  Nephrology following.   Signed, Kathlyn Sacramento , MD 9:49 AM 12/11/2015

## 2015-12-11 NOTE — Progress Notes (Signed)
Patient on vent- Fentanyl. At one point when family visited patient patient became stimulated and heart rate in the 170's. Metoprolol ordered and has been in the 90's to low 100's. Dr Fletcher Anon increased scheduled metoprolol dosages. Patient will intermit follow some simple commands. Has had a few loose bowel movements. Cdiff negative. Dr Delana Meyer is to have a family meeting regarding patients plan of care.

## 2015-12-11 NOTE — Progress Notes (Signed)
[facility]  Daily Progress Note   Subjective  - 5/6 Day Post-Op  Intubated and sedated with severe acidosis this past Saturday  Continues to be borderline hypotensive, off pressors  Became very combative but is not purposeful  Critically ill  Objective Filed Vitals:   12/11/15 0900 12/11/15 1000 12/11/15 1100 12/11/15 1200  BP: 148/90 120/71 119/72 116/68  Pulse: 130 100 96 99  Temp:      TempSrc:      Resp: '15 14 12 19  '$ Height:      Weight:      SpO2: 100% 100% 100% 100%    Intake/Output Summary (Last 24 hours) at 12/11/15 1322 Last data filed at 12/11/15 0600  Gross per 24 hour  Intake 3324.8 ml  Output   3100 ml  Net  224.8 ml    PULM  CTAB CV  RRR VASC  Left leg cool to the calf, no pulses present  Laboratory CBC    Component Value Date/Time   WBC 18.6* 12/10/2015 0816   WBC 4.9 10/25/2014 1331   HGB 8.7* 12/10/2015 0816   HGB 12.1* 10/25/2014 1331   HCT 26.6* 12/10/2015 0816   HCT 38.1* 10/25/2014 1331   PLT 105* 12/10/2015 0816   PLT 151 10/25/2014 1331    BMET    Component Value Date/Time   NA 142 12/11/2015 0508   NA 138 10/25/2014 1331   K 3.8 12/11/2015 0508   K 3.9 10/25/2014 1331   CL 110 12/11/2015 0508   CL 109* 10/25/2014 1331   CO2 29 12/11/2015 0508   CO2 21 10/25/2014 1331   GLUCOSE 234* 12/11/2015 0508   GLUCOSE 232* 10/25/2014 1331   BUN 35* 12/11/2015 0508   BUN 17 10/25/2014 1331   CREATININE 2.06* 12/11/2015 0508   CREATININE 1.11 10/25/2014 1331   CALCIUM 7.5* 12/11/2015 0508   CALCIUM 8.7 10/25/2014 1331   GFRNONAA 31* 12/11/2015 0508   GFRNONAA >60 10/25/2014 1331   GFRNONAA 52* 07/08/2014 1051   GFRAA 36* 12/11/2015 0508   GFRAA >60 10/25/2014 1331   GFRAA >60 07/08/2014 1051    Assessment/Planning: POD #5/6 s/p thrombolysis and thrombectomy for ischemic leg   Bypass is likely rethrombosed secondary to MI and hemodynamic instability.  He will need an AKA on the left when stable enough  to tolerate a general anesthesia. I discussed the situation with cardiology who feel that it is acceptable to proceed with surgery. I will discuss the situation with the son tomorrow and potentially place the patient on the schedule for Friday  Acute MI his hemodynamics have been stable and echo showed an EF greater than 30%  Hypotension on pressors (now off), bypass is reoccluded and little we can do at this point to salvage   Head CT is negative for intracranial hemorrhage, subacute infarcts noted in the cerebellar area  Possible aspiration pneumonia. On abx  Critically ill.  Overall prognosis is very poor.  Appreciate critical care input will plan to discuss the next stage of his care with family tomorrow  [name]  12/11/2015, 1:22 PM

## 2015-12-11 NOTE — Progress Notes (Signed)
Pharmacy Antibiotic Note  Stephen Camacho is a 70 y.o. male admitted on 12/05/2015 with ischemic leg.  Pharmacy has been consulted for Vancomycin and Zosyn dosing.   Plan: Patient with negative MRSA PCR and TA with MSSA. After discussion with Dr. Stevenson Clinch, will d/c vancomycin.  Continue Zosyn 3.375 g IV q8h per EI protocol.  Height: '6\' 4"'$  (193 cm) Weight: 176 lb 5.9 oz (80 kg) IBW/kg (Calculated) : 86.8  Temp (24hrs), Avg:99.4 F (37.4 C), Min:98.5 F (36.9 C), Max:101.6 F (38.7 C)   Recent Labs Lab 12/06/15 1508 12/06/15 2007 12/07/15 0304  12/07/15 4235 12/07/15 3614 12/07/15 1418  12/09/15 0107 12/09/15 0427 12/09/15 2034 12/10/15 0442 12/10/15 0816 12/10/15 2331 12/11/15 0508  WBC  --  10.5  --   --   --  17.1*  --   --   --  16.9* 18.5*  --  18.6*  --   --   CREATININE  --  2.69*  --   < >  --   --   --   < > 2.73* 2.66*  --  2.19*  --  1.99* 2.06*  LATICACIDVEN 17.8* 7.5* 3.2*  --  4.8*  --  3.0*  --   --   --   --   --   --   --   --   VANCOTROUGH  --   --   --   --   --   --   --   --   --   --   --  9*  --   --   --   < > = values in this interval not displayed.  Estimated Creatinine Clearance: 37.8 mL/min (by C-G formula based on Cr of 2.06).    No Known Allergies  Antimicrobials this admission: Zosyn 2/11 >>  Vancomycin 2/11 >> 02/16   Microbiology results: Results for orders placed or performed during the hospital encounter of 12/05/15  Culture, respiratory (NON-Expectorated)     Status: None   Collection Time: 12/06/15  4:05 PM  Result Value Ref Range Status   Specimen Description NASAL SWAB  Final   Special Requests NONE  Final   Gram Stain RARE WBC SEEN FEW GRAM POSITIVE COCCI   Final   Culture HEAVY GROWTH STAPHYLOCOCCUS AUREUS  Final   Report Status 12/09/2015 FINAL  Final   Organism ID, Bacteria STAPHYLOCOCCUS AUREUS  Final      Susceptibility   Staphylococcus aureus - MIC*    CIPROFLOXACIN 2 INTERMEDIATE Intermediate     GENTAMICIN  <=0.5 SENSITIVE Sensitive     OXACILLIN <=0.25 SENSITIVE Sensitive     TETRACYCLINE <=1 SENSITIVE Sensitive     VANCOMYCIN 1 SENSITIVE Sensitive     TRIMETH/SULFA <=10 SENSITIVE Sensitive     RIFAMPIN <=0.5 SENSITIVE Sensitive     Inducible Clindamycin NEGATIVE Sensitive     * HEAVY GROWTH STAPHYLOCOCCUS AUREUS  Culture, blood (Routine X 2) w Reflex to ID Panel     Status: None (Preliminary result)   Collection Time: 12/07/15 12:09 AM  Result Value Ref Range Status   Specimen Description BLOOD BLOOD RIGHT HAND  Final   Special Requests BOTTLES DRAWN AEROBIC AND ANAEROBIC 10CC  Final   Culture NO GROWTH 4 DAYS  Final   Report Status PENDING  Incomplete  Culture, blood (Routine X 2) w Reflex to ID Panel     Status: None (Preliminary result)   Collection Time: 12/07/15 12:09 AM  Result Value Ref Range Status  Specimen Description BLOOD BLOOD LEFT HAND  Final   Special Requests BOTTLES DRAWN AEROBIC AND ANAEROBIC 10CC  Final   Culture NO GROWTH 4 DAYS  Final   Report Status PENDING  Incomplete  Urine culture     Status: None   Collection Time: 12/07/15 12:11 AM  Result Value Ref Range Status   Specimen Description URINE, CLEAN CATCH  Final   Special Requests NONE  Final   Culture NO GROWTH 1 DAY  Final   Report Status 12/08/2015 FINAL  Final  MRSA PCR Screening     Status: None   Collection Time: 12/08/15  9:12 AM  Result Value Ref Range Status   MRSA by PCR NEGATIVE NEGATIVE Final    Comment:        The GeneXpert MRSA Assay (FDA approved for NASAL specimens only), is one component of a comprehensive MRSA colonization surveillance program. It is not intended to diagnose MRSA infection nor to guide or monitor treatment for MRSA infections.     Pharmacy will continue to monitor and adjust per consult.  Currie Paris, PharmD 12/11/2015

## 2015-12-11 NOTE — Progress Notes (Signed)
Pt remains on vent, sedated with fentanyl.  Lungs are clear and diminished.  Pt remains sinus tach, sinus rhythm on the cardiac monitor.Pulses in right leg were found behind the knee with the doppler.   Pt did have occasional pvc's.  At one time, pt did follow some simple commands but most of the time did not.  Tube feeds remain infusing, pt tolerating them well.  Foley remains in place with more than adequate urine output.  Pt did run a temp of 101.59F.  Tylenol was given and fever broke.

## 2015-12-11 NOTE — Consult Note (Signed)
PULMONARY / CRITICAL CARE MEDICINE   Name: Stephen Camacho MRN: [id number] DOB: [birth year]    ADMISSION DATE:  12/05/2015   CONSULTATION DATE: 12/06/2015   REFERRING MD:  Delana Meyer  CHIEF COMPLAINT: Acute respiratory failure, left fem-pop bypass graft occlusion and SOB  REVIEW OF SYSTEMS:   Unable to obtain, patient is intubated and sedated  SUBJECTIVE:  Off levophed, started having increase UOP, and did not require any dialysis. Noted to have large bowel movement this morning, more awake today, following simple commands, noted to have increased blood pressure requiring labetalol, fever overnight to MAXIMUM TEMPERATURE of 101.3 requiring Tylenol.  VITAL SIGNS: BP 131/73 mmHg  Pulse 116  Temp(Src) 98.5 F (36.9 C) (Oral)  Resp 15  Ht '6\' 4"'$  (1.93 m)  Wt 176 lb 5.9 oz (80 kg)  BMI 21.48 kg/m2  SpO2 100%  HEMODYNAMICS:    VENTILATOR SETTINGS: Vent Mode:  [-] PRVC FiO2 (%):  [30 %] 30 % Set Rate:  [13 bmp] 13 bmp Vt Set:  [500 mL] 500 mL PEEP:  [5 cmH20] 5 cmH20  INTAKE / OUTPUT: I/O last 3 completed shifts: In: 6496.4 [I.V.:4310.8; NG/GT:1585.7; IV Piggyback:600] Out: 4500 [Urine:4500]  PHYSICAL EXAMINATION: General: Frail looking male,on vent intubated Neuro: no significant purposeful movement HEENT: PERRLA, Oral mucosa with moderate amount of thick secretions  Cardiovascular: ST, s1/s2, no MRG Lungs:  Labored, bilateral airflow, scattered rhonchi in all lung fields Abdomen: Soft, NT/ND, +BS X4 Musculoskeletal:  Right AKA, left leg with well healed bypass scar Extremities: left leg cold; no palpable pulses, +femoral pulse Skin: Cold; mild discoloration in left leg  LABS:  BMET  Recent Labs Lab 12/10/15 0442 12/10/15 2331 12/11/15 0508  NA 143 142 142  K 4.0 4.0 3.8  CL 107 109 110  CO2 '30 28 29  '$ BUN 39* 36* 35*  CREATININE 2.19* 1.99* 2.06*  GLUCOSE 188* 167* 234*    Electrolytes  Recent Labs Lab 12/08/15 0533  12/09/15 0427 12/10/15 0442  12/10/15 2331 12/11/15 0508  CALCIUM 7.0*  < > 7.1* 7.7* 7.7* 7.5*  MG 1.6*  --   --  2.0 2.0  --   PHOS 4.7*  --  4.3  --  2.8  --   < > = values in this interval not displayed.  CBC  Recent Labs Lab 12/09/15 0427 12/09/15 2034 12/10/15 0816  WBC 16.9* 18.5* 18.6*  HGB 6.3* 8.2* 8.7*  HCT 19.8* 25.0* 26.6*  PLT 90* 86* 105*    Coag's  Recent Labs Lab 12/05/15 0344  12/05/15 2053 12/06/15 0332 12/06/15 0832  APTT 41*  < > 132* 79* 72*  INR 1.99  --   --   --   --   < > = values in this interval not displayed.  Sepsis Markers  Recent Labs Lab 12/06/15 1830  12/07/15 0304 12/07/15 0623 12/07/15 1418  LATICACIDVEN  --   < > 3.2* 4.8* 3.0*  PROCALCITON 0.54  --   --   --   --   < > = values in this interval not displayed.  ABG  Recent Labs Lab 12/06/15 1935 12/06/15 2316 12/07/15 0854  PHART 7.52* 7.43 7.49*  PCO2ART 26* 39 36  PO2ART 168* 173* 179*    Liver Enzymes  Recent Labs Lab 12/05/15 0344 12/09/15 0427  AST 17  --   ALT 11*  --   ALKPHOS 83  --   BILITOT 0.4  --   ALBUMIN 3.9 1.9*    Cardiac Enzymes  Recent Labs Lab 12/06/15 2007 12/07/15 0622 12/07/15 0658  TROPONINI 22.40* 62.60* 57.81*    Glucose  Recent Labs Lab 12/10/15 2355 12/11/15 0246 12/11/15 0612 12/11/15 0926 12/11/15 0953 12/11/15 1319  GLUCAP 135* 153* 198* 205* 201* 189*    Imaging No results found.  STUDIES:    CULTURES: 02/11 Blood x2>> Urine>> Respiratory>>heavy growth staph  ANTIBIOTICS: Piperacillin-tazobactam 02/11> Vancomycin 02/11>  SIGNIFICANT EVENTS: 02/07>ED with SOB and left calf pain>treated and released 02/09>ED with cold left foot and calf pain>CT angio>complete occlusion of the left fem-pop bypass graft>admitted 02/11>OR for angiogram with thrombolysis, thrombectomy of left peroneal artery, tibioperoneal trunk, popliteal artery, and femoral to popliteal bypass graft and percutaneous transluminal angioplasty of the left  peroneal artery and distal bypass anastomosis and popliteal artery Post-op: residual thrombosis of femoral to popliteal bypass graft and all tibial vessels; acute respiratory failure>Intubated   LINES/TUBES: Right IJ tlc 02/10> ETT 02/11> L Fem vascath 2/13>>  DISCUSSION: 70 YO male with acute complete occlusion of the  left fem-pop bypass graft s/p thrombolysis/thrombectomy/percuteneous transluminal angioplasty with residual thrombosis, sepsis secondary to possible necrosis from left leg thrombosis, and  acute hypoxic respiratory failure secondary to sepsis and complicated by acute NSTEMI and worsening renal fcn, now slowly improving, still critically ill due to left leg. .   ASSESSMENT / PLAN:  PULMONARY A: Acute hypoxic respiratory failure Possible aspiration pneumonia-Copious amount of secretions noted in airway during intubation-now with improvement.  Severe metabolic acidosis - resolving well H/O Lung cancer s/p right lung lobectomy P:   -Full vent support-Settings: PRVC, wean as tolerated.  -ABG pending -VAP prevention protocol -F/U CXR as needed -Albuterol and Duoneb prn -Abx as above -F/U cultures - respiratory now with stap, currently being treated.   CARDIOVASCULAR-NSTEMI A:  Bradycardia-Resolved; Now in ST Septic shock H/O hypertension NSTEMI Afib P:  -IV fluid boluses until CVP GE12 -Levophed gtt and titrate to MAP goal >65 -Hemodynamics per ICU protocol -increasing metoprolol for afib -cardiology following - rec low dose ASA -check ECHO>>EF 55-60%, normal LV wall motion  RENAL A:   AKI secondary procedural IV dye and volume depletion; baseline creatinine 0.83 P:   -IV fluids, LR -Trend creatinine -Renally dose meds -nephro following - vascath placed, no dialysis at this time due to increase UOP.   GASTROINTESTINAL A:   Constipation P:   -PPI for GI prophylaxis -Monitor BMs -cont with TF  HEMATOLOGIC A:   Left leg ischemia-Complete Occlusion  of the left fem-pop bypass graft s/p thrombolysis/PTA with residual thrombosis Right neck hematoma s/p TLC placement Severe PVD s/p Right AKA P:  -Vascular surgery following - plan for possible surgical intervention of LLE, awaiting cardiology input.  -Anticoagulation per vasc surgery -Monitor left limb for re-thrombosis -Monitor for bleeding-transfuse as needed, -DVT - heparin on hold, due to neck hematoma  INFECTIOUS A:   Septic shock-likely source is left leg ischemia/necrosis from massive thrombus P:   -Antibiotics as above -F/U cultures -IV fluids and pressure to keep MAP>65 -F/U lactic acid level and procalcitonin  ENDOCRINE A:   Type 2 diabetes mellitus   P:   -Blood glucose monitoring with SSI coverage -Hold oral hypoglycemic agents  NEUROLOGIC A:   Acute metabolic encephalopathy H/o CVA H/O Seizures-Not on home seizure meds P:   RASS goal: -2 -Fentanyl infusion and versed  Prn for vent sedation -Monitor for seizure activity   The Patient requires high complexity decision making for assessment and support, frequent evaluation and titration of therapies, application of  advanced monitoring technologies and extensive interpretation of multiple databases.  Critical Care Time devoted to patient care services described in this note is 35 minutes.   Overall, patient is critically ill, prognosis is guarded.  Patient with Multiorgan failure and at high risk for cardiac arrest and death.    Vilinda Boehringer, MD [facility] Pager [phone] (please enter 7-digits) On Call Pager - [phone] (please enter 7-digits)

## 2015-12-11 NOTE — Progress Notes (Signed)
[facility]  ROUNDING NOTE   Subjective:   UOP 3400 - indwelling foley placed by Urology.   LR at 179m/hr  Objective:  Vital signs in last 24 hours:  Temp:  [98.5 F (36.9 C)-101.6 F (38.7 C)] 98.5 F (36.9 C) (02/16 0400) Pulse Rate:  [97-130] 130 (02/16 0900) Resp:  [10-24] 15 (02/16 0900) BP: (101-148)/(52-90) 148/90 mmHg (02/16 0900) SpO2:  [100 %] 100 % (02/16 0900) FiO2 (%):  [30 %] 30 % (02/16 0833) Weight:  [80 kg (176 lb 5.9 oz)] 80 kg (176 lb 5.9 oz) (02/16 0556)  Weight change:  Filed Weights   12/09/15 0553 12/10/15 0900 12/11/15 0556  Weight: 77.7 kg (171 lb 4.8 oz) 79.1 kg (174 lb 6.1 oz) 80 kg (176 lb 5.9 oz)    Intake/Output: I/O last 3 completed shifts: In: 6496.4 [I.V.:4310.8; NG/GT:1585.7; IV Piggyback:600] Out: 4500 [Urine:4500]   Intake/Output this shift:     Physical Exam: General: Critically ill  Head: Eyes closed, ETT, NGT  Eyes: Eyes closed  Neck: Right central line IJ  Lungs:  Bilateral diminished breath sounds, PRVC 30%  Heart: Sinus to sinus tach  Abdomen:  Soft, nontender  Extremities:  Right AKA, left lower extremity with cyanosis and diminished pulses  Neurologic: Intubated and sedated  Skin: No lesions  Access: Right femoral vascath 12/08/15 Dr. MStevenson Clinch   Basic Metabolic Panel:  Recent Labs Lab 12/06/15 2007 12/07/15 0355902/13/17 0533 12/09/15 0107 12/09/15 0741602/15/17 0442 12/10/15 2331 12/11/15 0508  NA 142 141 140 140 140 143 142 142  K 4.1 4.0 4.0 4.1 4.1 4.0 4.0 3.8  CL 108 105 105 106 105 107 109 110  CO2 '24 24 29 30 31 30 28 29  '$ GLUCOSE 176* 212* 181* 163* 180* 188* 167* 234*  BUN 31* 35* 40* 36* 38* 39* 36* 35*  CREATININE 2.69* 2.78* 3.07* 2.73* 2.66* 2.19* 1.99* 2.06*  CALCIUM 7.3* 6.9* 7.0* 7.0* 7.1* 7.7* 7.7* 7.5*  MG 1.4* 1.3* 1.6*  --   --  2.0 2.0  --   PHOS 4.0 4.8* 4.7*  --  4.3  --  2.8  --     Liver Function Tests:  Recent Labs Lab 12/05/15 0344 12/09/15 0427  AST 17   --   ALT 11*  --   ALKPHOS 83  --   BILITOT 0.4  --   PROT 8.0  --   ALBUMIN 3.9 1.9*   No results for input(s): LIPASE, AMYLASE in the last 168 hours. No results for input(s): AMMONIA in the last 168 hours.  CBC:  Recent Labs Lab 12/06/15 2007 12/07/15 [id number]/14/17 0427 12/09/15 2034 12/10/15 0816  WBC 10.5 17.1* 16.9* 18.5* 18.6*  NEUTROABS  --   --   --  16.2* 16.6*  HGB 4.8* 8.3* 6.3* 8.2* 8.7*  HCT 15.0* 25.1* 19.8* 25.0* 26.6*  MCV 78.1* 81.8 83.4 84.1 84.5  PLT 85* 76* 90* 86* 105*    Cardiac Enzymes:  Recent Labs Lab 12/06/15 1647 12/06/15 2007 12/07/15 0622 12/07/15 0658 12/07/15 1418  CKTOTAL  --   --   --   --  104  TROPONINI 2.41* 22.40* 62.60* 57.81*  --     BNP: Invalid input(s): POCBNP  CBG:  Recent Labs Lab 12/10/15 2355 12/11/15 0246 12/11/15 0612 12/11/15 0926 12/11/15 0953  GLUCAP 135* 153* 198* 205* 201*    Microbiology: Results for orders placed or performed during the hospital encounter of 12/05/15  Culture, respiratory (NON-Expectorated)  Status: None   Collection Time: 12/06/15  4:05 PM  Result Value Ref Range Status   Specimen Description NASAL SWAB  Final   Special Requests NONE  Final   Gram Stain RARE WBC SEEN FEW GRAM POSITIVE COCCI   Final   Culture HEAVY GROWTH STAPHYLOCOCCUS AUREUS  Final   Report Status 12/09/2015 FINAL  Final   Organism ID, Bacteria STAPHYLOCOCCUS AUREUS  Final      Susceptibility   Staphylococcus aureus - MIC*    CIPROFLOXACIN 2 INTERMEDIATE Intermediate     GENTAMICIN <=0.5 SENSITIVE Sensitive     OXACILLIN <=0.25 SENSITIVE Sensitive     TETRACYCLINE <=1 SENSITIVE Sensitive     VANCOMYCIN 1 SENSITIVE Sensitive     TRIMETH/SULFA <=10 SENSITIVE Sensitive     RIFAMPIN <=0.5 SENSITIVE Sensitive     Inducible Clindamycin NEGATIVE Sensitive     * HEAVY GROWTH STAPHYLOCOCCUS AUREUS  Culture, blood (Routine X 2) w Reflex to ID Panel     Status: None (Preliminary result)   Collection Time:  12/07/15 12:09 AM  Result Value Ref Range Status   Specimen Description BLOOD BLOOD RIGHT HAND  Final   Special Requests BOTTLES DRAWN AEROBIC AND ANAEROBIC 10CC  Final   Culture NO GROWTH 4 DAYS  Final   Report Status PENDING  Incomplete  Culture, blood (Routine X 2) w Reflex to ID Panel     Status: None (Preliminary result)   Collection Time: 12/07/15 12:09 AM  Result Value Ref Range Status   Specimen Description BLOOD BLOOD LEFT HAND  Final   Special Requests BOTTLES DRAWN AEROBIC AND ANAEROBIC 10CC  Final   Culture NO GROWTH 4 DAYS  Final   Report Status PENDING  Incomplete  Urine culture     Status: None   Collection Time: 12/07/15 12:11 AM  Result Value Ref Range Status   Specimen Description URINE, CLEAN CATCH  Final   Special Requests NONE  Final   Culture NO GROWTH 1 DAY  Final   Report Status 12/08/2015 FINAL  Final  MRSA PCR Screening     Status: None   Collection Time: 12/08/15  9:12 AM  Result Value Ref Range Status   MRSA by PCR NEGATIVE NEGATIVE Final    Comment:        The GeneXpert MRSA Assay (FDA approved for NASAL specimens only), is one component of a comprehensive MRSA colonization surveillance program. It is not intended to diagnose MRSA infection nor to guide or monitor treatment for MRSA infections.     Coagulation Studies: No results for input(s): LABPROT, INR in the last 72 hours.  Urinalysis: No results for input(s): COLORURINE, LABSPEC, PHURINE, GLUCOSEU, HGBUR, BILIRUBINUR, KETONESUR, PROTEINUR, UROBILINOGEN, NITRITE, LEUKOCYTESUR in the last 72 hours.  Invalid input(s): APPERANCEUR    Imaging: No results found.   Medications:   . feeding supplement (VITAL AF 1.2 CAL) 1,000 mL (12/10/15 1645)  . fentaNYL infusion INTRAVENOUS 300 mcg/hr (12/11/15 0213)  . lactated ringers 100 mL/hr at 12/11/15 0058  . nitroGLYCERIN    . norepinephrine (LEVOPHED) Adult infusion 1 mcg/min (12/06/15 2312)   . antiseptic oral rinse  7 mL Mouth Rinse  QID  . bisacodyl  10 mg Rectal Once  . chlorhexidine gluconate  15 mL Mouth Rinse BID  . feeding supplement (PRO-STAT SUGAR FREE 64)  30 mL Per Tube QID  . free water  30 mL Per Tube 6 times per day  . insulin aspart  0-15 Units Subcutaneous Q4H  . insulin  regular  8 Units Subcutaneous Once  . ipratropium-albuterol  3 mL Nebulization Q6H  . metoprolol tartrate  50 mg Per Tube BID  . pantoprazole sodium  40 mg Per Tube Daily  . piperacillin-tazobactam (ZOSYN)  IV  3.375 g Intravenous 3 times per day  . vancomycin  1,000 mg Intravenous Q24H   acetaminophen **OR** [DISCONTINUED] acetaminophen, albuterol, bisacodyl, fentaNYL (SUBLIMAZE) injection, fentaNYL (SUBLIMAZE) injection, heparin, hydrALAZINE, labetalol, metoprolol, midazolam, ondansetron **OR** ondansetron (ZOFRAN) IV, sennosides  Assessment/ Plan:  Mr. Stephen Camacho is a 70 y.o. black  male with hypertension, hyperlipidemia, diabetes mellitus type II, coronary artery disease status post 2 vessel CABG, peripheral arterial disease status post right above-the-knee amputation, seizure disorder, lung cancer, prostate cancer, history of CVA, who was admitted to [facility] on 12/05/2015 for ischemic left lower extremity. Hospital course complicated by acute renal failure, acute respiratory failure, new CVAs, myocardial infarction.  1. Acute renal failure:  ATN from contrast exposure. nonoliguric urine output. renal function stable. Baseline creatinine of 0.83. Electrolytes currently at goal.  - No acute indication for dialysis at this time. Continue monitor closely for dialysis need. Nonoliguric urine output. Monitor volume status, respiratory status, urine output, electrolytes and kidney function - Renally dose all medications - IVF LR @ 160m/hr  2. Acute respiratory failure: Status post intubation and on mechanical ventilation. With acute myocardial infarction and acute CVA - echocardiogram with diastolic failure.  - Appreciate cardiology and  pulmonary input.   3. Ischemic left lower extremity: peripheral vascular disease: and now with acute DVT.  - Appreciate vascular input.   4. Anemia with renal failure: PRBC transfusion 2/14. Hemoglobin 8.7. Platelets trending upward, 105.   LOS: 6 Stephen Camacho 2/16/201710:05 AM

## 2015-12-12 ENCOUNTER — Inpatient Hospital Stay: Payer: PPO | Admitting: Anesthesiology

## 2015-12-12 ENCOUNTER — Encounter
Admission: EM | Disposition: A | Discharge status: Nursing Home (Includes ICF) | Payer: Self-pay | Source: Home / Self Care | Attending: Vascular Surgery

## 2015-12-12 ENCOUNTER — Encounter: Payer: Self-pay | Admitting: Anesthesiology

## 2015-12-12 DIAGNOSIS — I472 Ventricular tachycardia: Secondary | ICD-10-CM

## 2015-12-12 HISTORY — PX: AMPUTATION: SHX166

## 2015-12-12 LAB — BASIC METABOLIC PANEL
ANION GAP: 5 (ref 5–15)
BUN: 31 mg/dL — ABNORMAL HIGH (ref 6–20)
CHLORIDE: 112 mmol/L — AB (ref 101–111)
CO2: 27 mmol/L (ref 22–32)
Calcium: 7.4 mg/dL — ABNORMAL LOW (ref 8.9–10.3)
Creatinine, Ser: 1.76 mg/dL — ABNORMAL HIGH (ref 0.61–1.24)
GFR, EST AFRICAN AMERICAN: 43 mL/min — AB (ref >60)
GFR, EST NON AFRICAN AMERICAN: 37 mL/min — AB (ref >60)
Glucose, Bld: 228 mg/dL — ABNORMAL HIGH (ref 65–99)
POTASSIUM: 4 mmol/L (ref 3.5–5.1)
SODIUM: 144 mmol/L (ref 135–145)

## 2015-12-12 LAB — CBC
HCT: 25.1 % — ABNORMAL LOW (ref 40.0–52.0)
HCT: 23.4 % — ABNORMAL LOW (ref 40.0–52.0)
Hemoglobin: 8 g/dL — ABNORMAL LOW (ref 13.0–18.0)
Hemoglobin: 7.5 g/dL — ABNORMAL LOW (ref 13.0–18.0)
MCH: 27.5 pg (ref 26.0–34.0)
MCH: 27.3 pg (ref 26.0–34.0)
MCHC: 31.9 g/dL — ABNORMAL LOW (ref 32.0–36.0)
MCHC: 32 g/dL (ref 32.0–36.0)
MCV: 86.1 fL (ref 80.0–100.0)
MCV: 85.4 fL (ref 80.0–100.0)
PLATELETS: 148 10*3/uL — AB (ref 150–440)
Platelets: 143 10*3/uL — ABNORMAL LOW (ref 150–440)
RBC: 2.91 MIL/uL — AB (ref 4.40–5.90)
RBC: 2.74 MIL/uL — ABNORMAL LOW (ref 4.40–5.90)
RDW: 16.1 % — ABNORMAL HIGH (ref 11.5–14.5)
RDW: 15.8 % — AB (ref 11.5–14.5)
WBC: 21 10*3/uL — AB (ref 3.8–10.6)
WBC: 17.7 10*3/uL — AB (ref 3.8–10.6)

## 2015-12-12 LAB — GLUCOSE, CAPILLARY
GLUCOSE-CAPILLARY: 218 mg/dL — AB (ref 65–99)
GLUCOSE-CAPILLARY: 192 mg/dL — AB (ref 65–99)
Glucose-Capillary: 172 mg/dL — ABNORMAL HIGH (ref 65–99)
Glucose-Capillary: 175 mg/dL — ABNORMAL HIGH (ref 65–99)
Glucose-Capillary: 180 mg/dL — ABNORMAL HIGH (ref 65–99)
Glucose-Capillary: 190 mg/dL — ABNORMAL HIGH (ref 65–99)
Glucose-Capillary: 228 mg/dL — ABNORMAL HIGH (ref 65–99)

## 2015-12-12 LAB — PHOSPHORUS: PHOSPHORUS: 2.4 mg/dL — AB (ref 2.5–4.6)

## 2015-12-12 LAB — MAGNESIUM: MAGNESIUM: 2 mg/dL (ref 1.7–2.4)

## 2015-12-12 LAB — SAMPLE TO BLOOD BANK

## 2015-12-12 LAB — PREPARE RBC (CROSSMATCH)

## 2015-12-12 SURGERY — AMPUTATION, ABOVE KNEE
Anesthesia: General | Laterality: Left | Wound class: Dirty or Infected

## 2015-12-12 MED ORDER — SODIUM CHLORIDE 0.9 % IJ SOLN
INTRAMUSCULAR | Status: AC
  Administered 2015-12-12: 3 mL
  Filled 2015-12-12: qty 10

## 2015-12-12 MED ORDER — FENTANYL CITRATE (PF) 100 MCG/2ML IJ SOLN: 25.0000 ug | INTRAMUSCULAR | Status: DC | PRN

## 2015-12-12 MED ORDER — MIDAZOLAM HCL 2 MG/2ML IJ SOLN
INTRAMUSCULAR | Status: DC | PRN
  Administered 2015-12-12: 2 mg via INTRAVENOUS

## 2015-12-12 MED ORDER — ONDANSETRON HCL 4 MG/2ML IJ SOLN: 4.0000 mg | Freq: Once | INTRAMUSCULAR | Status: DC | PRN

## 2015-12-12 MED ORDER — SODIUM CHLORIDE 0.9 % IV BOLUS (SEPSIS): 500.0000 mL | Freq: Once | INTRAVENOUS | Status: DC

## 2015-12-12 MED ORDER — NOREPINEPHRINE BITARTRATE 1 MG/ML IV SOLN
INTRAVENOUS | Status: AC
  Filled 2015-12-12: qty 4

## 2015-12-12 MED ORDER — EPHEDRINE SULFATE 50 MG/ML IJ SOLN
INTRAMUSCULAR | Status: DC | PRN
  Administered 2015-12-12: 10 mg via INTRAVENOUS

## 2015-12-12 MED ORDER — ALTEPLASE 2 MG IJ SOLR
2.0000 mg | Freq: Once | INTRAMUSCULAR | Status: AC
  Administered 2015-12-12: 2 mg
  Filled 2015-12-12: qty 2

## 2015-12-12 MED ORDER — FENTANYL CITRATE (PF) 100 MCG/2ML IJ SOLN
INTRAMUSCULAR | Status: DC | PRN
  Administered 2015-12-12 (×2): 50 ug via INTRAVENOUS
  Administered 2015-12-12: 100 ug via INTRAVENOUS

## 2015-12-12 MED ORDER — POTASSIUM CHLORIDE 10 MEQ/100ML IV SOLN
10.0000 meq | INTRAVENOUS | Status: AC
  Administered 2015-12-12 (×2): 10 meq via INTRAVENOUS
  Filled 2015-12-12 (×2): qty 100

## 2015-12-12 MED ORDER — POTASSIUM PHOSPHATES 15 MMOLE/5ML IV SOLN
10.0000 mmol | Freq: Once | INTRAVENOUS | Status: DC
  Filled 2015-12-12: qty 3.33

## 2015-12-12 SURGICAL SUPPLY — 39 items
BAG COUNTER SPONGE EZ (MISCELLANEOUS) ×2 IMPLANT
BANDAGE ELASTIC 6 LF NS (GAUZE/BANDAGES/DRESSINGS) ×3 IMPLANT
BLADE SAW GIGLI 510 (BLADE) ×2 IMPLANT
BLADE SAW GIGLI 510MM (BLADE) ×1
BLADE SURG SZ10 CARB STEEL (BLADE) ×3 IMPLANT
BNDG COHESIVE 4X5 TAN STRL (GAUZE/BANDAGES/DRESSINGS) ×3 IMPLANT
BNDG GAUZE 4.5X4.1 6PLY STRL (MISCELLANEOUS) ×6 IMPLANT
CANISTER SUCT 1200ML W/VALVE (MISCELLANEOUS) ×3 IMPLANT
CHLORAPREP W/TINT 26ML (MISCELLANEOUS) ×3 IMPLANT
COUNTER SPONGE BAG EZ (MISCELLANEOUS) ×1
DRAPE INCISE IOBAN 66X45 STRL (DRAPES) ×3 IMPLANT
ELECT CAUTERY BLADE 6.4 (BLADE) ×3 IMPLANT
ELECT REM PT RETURN 9FT ADLT (ELECTROSURGICAL) ×3
ELECTRODE REM PT RTRN 9FT ADLT (ELECTROSURGICAL) ×1 IMPLANT
GAUZE FLUFF 18X24 1PLY STRL (GAUZE/BANDAGES/DRESSINGS) ×3 IMPLANT
GAUZE PETRO XEROFOAM 1X8 (MISCELLANEOUS) ×3 IMPLANT
GLOVE BIO SURGEON STRL SZ7 (GLOVE) ×6 IMPLANT
GLOVE INDICATOR 7.5 STRL GRN (GLOVE) ×3 IMPLANT
GLOVE SURG SYN 8.0 (GLOVE) ×3 IMPLANT
GOWN STRL REUS W/ TWL LRG LVL3 (GOWN DISPOSABLE) ×3 IMPLANT
GOWN STRL REUS W/ TWL XL LVL3 (GOWN DISPOSABLE) ×1 IMPLANT
GOWN STRL REUS W/TWL LRG LVL3 (GOWN DISPOSABLE) ×6
GOWN STRL REUS W/TWL XL LVL3 (GOWN DISPOSABLE) ×2
HANDLE YANKAUER SUCT BULB TIP (MISCELLANEOUS) ×3 IMPLANT
KIT RM TURNOVER STRD PROC AR (KITS) ×3 IMPLANT
NS IRRIG 500ML POUR BTL (IV SOLUTION) ×3 IMPLANT
PACK EXTREMITY ARMC (MISCELLANEOUS) ×3 IMPLANT
PAD PREP 24X41 OB/GYN DISP (PERSONAL CARE ITEMS) ×3 IMPLANT
SPONGE LAP 18X18 5 PK (GAUZE/BANDAGES/DRESSINGS) ×12 IMPLANT
STAPLER SKIN PROX 35W (STAPLE) ×3 IMPLANT
STOCKINETTE M/LG 89821 (MISCELLANEOUS) ×3 IMPLANT
SUT ETHIBOND 0 36 GRN (SUTURE) ×6 IMPLANT
SUT SILK 2 0 (SUTURE) ×2
SUT SILK 2-0 18XBRD TIE 12 (SUTURE) ×1 IMPLANT
SUT VIC AB 0 CT1 36 (SUTURE) ×24 IMPLANT
SUT VIC AB 3-0 SH 27 (SUTURE) ×8
SUT VIC AB 3-0 SH 27X BRD (SUTURE) ×4 IMPLANT
SUT VICRYL PLUS ABS 0 54 (SUTURE) ×3 IMPLANT
TAPE UMBIL 1/8X18 RADIOPA (MISCELLANEOUS) ×3 IMPLANT

## 2015-12-12 NOTE — Progress Notes (Signed)
Inpatient Diabetes Program Recommendations  AACE/ADA: New Consensus Statement on Inpatient Glycemic Control (2015)  Target Ranges:  Prepandial:   less than 140 mg/dL      Peak postprandial:   less than 180 mg/dL (1-2 hours)      Critically ill patients:  140 - 180 mg/dL  Results for Stephen Camacho, PETZ (MRN [id number]) as of 12/12/2015 09:01  Ref. Range 12/11/2015 06:12 12/11/2015 09:26 12/11/2015 09:53 12/11/2015 13:19 12/11/2015 18:19 12/11/2015 21:37 12/12/2015 02:50 12/12/2015 06:30  Glucose-Capillary Latest Ref Range: 65-99 mg/dL 198 (H) 205 (H) 201 (H) 189 (H) 209 (H) 190 (H) 218 (H) 192 (H)   Review of Glycemic Control  Diabetes history: DM2 Outpatient Diabetes medications: Lantus 10 units QHS, Metformin 500 mg BID Current orders for Inpatient glycemic control: Novolog 0-15 units Q4H  Inpatient Diabetes Program Recommendations: Insulin - Basal: Over the past 24 hours, glucsoe has ranged from 189-218 mg/dl and patient has received a total of Novolog 24 units for correction. Please consider ordering low dose basal insulin; recommend starting with Lantus 8 units Q24H starting now (based on 78.2 kg x 0.1 units). Insulin - Meal Coverage: Please consider ordering Novolog 2 units Q4H for tube feeding coverage (in addition to Novolog correction scale). Tube feeding coverage should be held if tube feeding is on hold or discontinued.  Thanks, Barnie Alderman, RN, MSN, CDE Diabetes Coordinator Inpatient Diabetes Program [phone] (Team Pager from [place] to [place]) [phone] ([facility] office) [phone] [facility] office) [phone] [facility] office)

## 2015-12-12 NOTE — Progress Notes (Signed)
Re paged Dr Delana Meyer and [facility] regarding patients hemoglobin.

## 2015-12-12 NOTE — Transfer of Care (Deleted)
Immediate Anesthesia Transfer of Care Note  Patient: Stephen Camacho  Procedure(s) Performed: Procedure(s): AMPUTATION ABOVE KNEE (Left)  Patient Location: ICU  Anesthesia Type:General  Level of Consciousness: sedated  Airway & Oxygen Therapy: Patient Spontanous Breathing, Patient remains intubated per anesthesia plan and Patient placed on Ventilator (see vital sign flow sheet for setting)  Post-op Assessment: Report given to RN and Post -op Vital signs reviewed and stable  Post vital signs: Reviewed and stable  Last Vitals:  Filed Vitals:   12/12/15 1200 12/12/15 1400  BP: 109/64 134/79  Pulse: 85 90  Temp: 37.2 C   Resp: 21 21    Complications: No apparent anesthesia complications

## 2015-12-12 NOTE — Progress Notes (Signed)
Initial Nutrition Assessment     INTERVENTION:   EN: recommend continuing current TF regimen, continue to assess   NUTRITION DIAGNOSIS:   Inadequate oral intake related to inability to eat as evidenced by NPO status. 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  Pt remains on vent, possible vascular surgical intervention of LLE today  Diet Order:  Diet NPO time specified   EN:  TF off since midnight for possible procedure, previously tolerating Vital AF 1.2 at 45 ml/hr Prostat QID  Digestive system: +multiple loose stools, Cdiff negative, no signs of TF intolerance   Recent Labs Lab 12/09/15 0427  12/10/15 2331 12/11/15 0508 12/11/15 2246 12/12/15 0501  NA 140  < > 142 142 143 144  K 4.1  < > 4.0 3.8 3.6 4.0  CL 105  < > 109 110 110 112*  CO2 31  < > '28 29 23 27  '$ BUN 38*  < > 36* 35* 31* 31*  CREATININE 2.66*  < > 1.99* 2.06* 1.93* 1.76*  CALCIUM 7.1*  < > 7.7* 7.5* 7.5* 7.4*  MG  --   < > 2.0  --  2.0 2.0  PHOS 4.3  --  2.8  --   --  2.4*  GLUCOSE 180*  < > 167* 234* 264* 228*  < > = values in this interval not displayed.  Glucose Profile:  Recent Labs  12/12/15 0250 12/12/15 0630 12/12/15 1004  GLUCAP 218* 192* 172*   Meds: LR at 100 ml/hr, ss novolog  Height:   Ht Readings from Last 1 Encounters:  12/05/15 '6\' 4"'$  (1.93 m)     Weight:   Wt Readings from Last 1 Encounters:  12/12/15 172 lb 6.4 oz (78.2 kg)   BMI:  Body mass index is 20.99 kg/(m^2).  Estimated Nutritional Needs:   Kcal:  1738kcals, using [employer] Equation with ABW of 65kg, (BEE: 1506kcals, Temp: 37.7, Ve: 6.5)  Protein:  136-182g protein (1.5-2.0g/kg) using IBW of 91kg  Fluid:  2275-2729m of fluid (25-324mkg) using IBW of 91kg  HIGH Care Level  CaBorgWarnerS, RD, LDN [phone]  ([phone]/On-Call Pager

## 2015-12-12 NOTE — Anesthesia Preprocedure Evaluation (Addendum)
Anesthesia Evaluation  Patient identified by MRN, date of birth, ID bandGeneral Assessment Comment:Patient on vent  Reviewed: Allergy & Precautions, NPO status , Patient's Chart, lab work & pertinent test results  Airway Mallampati: Intubated  TM Distance: >3 FB     Dental  (+) Dental Advisory Given   Pulmonary COPD,  oxygen dependent,  On vent   Pulmonary exam normal breath sounds clear to auscultation       Cardiovascular hypertension, Pt. on medications + CAD, + Past MI and + Peripheral Vascular Disease  Normal cardiovascular exam     Neuro/Psych Seizures -, Well Controlled,  CVA, Residual Symptoms negative psych ROS   GI/Hepatic negative GI ROS, Neg liver ROS,   Endo/Other  diabetes, Well Controlled, Type 2  Renal/GU negative Renal ROS     Musculoskeletal  (+) Arthritis , Osteoarthritis,    Abdominal Normal abdominal exam  (+)   Peds  Hematology  (+) anemia ,   Anesthesia Other Findings   Reproductive/Obstetrics                            Anesthesia Physical Anesthesia Plan  ASA: IV and emergent  Anesthesia Plan: General   Post-op Pain Management:    Induction:   Airway Management Planned: Oral ETT  Additional Equipment:   Intra-op Plan:   Post-operative Plan: Extubation in OR  Informed Consent:   Plan Discussed with:   Anesthesia Plan Comments: (Patient on Vent...will hooh up to ETT)        Anesthesia Quick Evaluation

## 2015-12-12 NOTE — Progress Notes (Signed)
[facility] Progress Note Patient Name: Stephen Camacho DOB: 1946-10-16 MRN: [id number]   Date of Service  12/12/2015  HPI/Events of Note  K+ = 3.6, Mg++ = 2.0 and Creatinine = 1.93.  eICU Interventions  Will replete K+.      Intervention Category Intermediate Interventions: Electrolyte abnormality - evaluation and management  [name],[name] 12/12/2015, 1:45 AM

## 2015-12-12 NOTE — Progress Notes (Signed)
RT called to room to assist in removing patient from vent for OR staff to transport patient to OR.  RT unaware upon arrival that this OR staff would not transport using BVM and did not bring trilogy transport vent to room.  RT made aware by nurse anesthesist that I would need to place patient on transport vent to take patient to OR doors despite the fact the OR staff was at bedside.  RT retrieved transport vent and placed patient on it to take to OR doors, then removed patient from vent and OR staff used BVM to take patient into OR.

## 2015-12-12 NOTE — Progress Notes (Signed)
[facility]  ROUNDING NOTE   Subjective:   UOP 1400 (3400)  Creatinine 1.76 (1.93)  LR is off  Objective:  Vital signs in last 24 hours:  Temp:  [98.3 F (36.8 C)-102.5 F (39.2 C)] 98.3 F (36.8 C) (02/17 0800) Pulse Rate:  [81-131] 95 (02/17 0800) Resp:  [12-20] 14 (02/17 0800) BP: (107-153)/(65-92) 128/80 mmHg (02/17 0800) SpO2:  [99 %-100 %] 100 % (02/17 0800) FiO2 (%):  [30 %] 30 % (02/17 0834) Weight:  [78.2 kg (172 lb 6.4 oz)] 78.2 kg (172 lb 6.4 oz) (02/17 0609)  Weight change: -0.9 kg (-1 lb 15.7 oz) Filed Weights   12/10/15 0900 12/11/15 0556 12/12/15 0609  Weight: 79.1 kg (174 lb 6.1 oz) 80 kg (176 lb 5.9 oz) 78.2 kg (172 lb 6.4 oz)    Intake/Output: I/O last 3 completed shifts: In: 6203.1 [I.V.:4813.1; NG/GT:840; IV Piggyback:550] Out: 9326 [Urine:3250]   Intake/Output this shift:  Total I/O In: 93.4 [I.V.:93.4] Out: -   Physical Exam: General: Critically ill  Head: Eyes closed, ETT, NGT  Eyes: Eyes closed  Neck: Right central line IJ  Lungs:  Bilateral diminished breath sounds, PRVC 30%  Heart: Sinus to sinus tach  Abdomen:  Soft, nontender  Extremities:  Right AKA, left lower extremity with cyanosis and diminished pulses  Neurologic: Intubated and sedated  Skin: No lesions  Access: Right femoral vascath 12/08/15 Dr. Stevenson Clinch    Basic Metabolic Panel:  Recent Labs Lab 12/06/15 2007 12/07/15 7124 12/08/15 0533  12/09/15 5809 12/10/15 9833 12/10/15 2331 12/11/15 0508 12/11/15 2246 12/12/15 0501  NA 142 141 140  < > 140 143 142 142 143 144  K 4.1 4.0 4.0  < > 4.1 4.0 4.0 3.8 3.6 4.0  CL 108 105 105  < > 105 107 109 110 110 112*  CO2 '24 24 29  '$ < > '31 30 28 29 23 27  '$ GLUCOSE 176* 212* 181*  < > 180* 188* 167* 234* 264* 228*  BUN 31* 35* 40*  < > 38* 39* 36* 35* 31* 31*  CREATININE 2.69* 2.78* 3.07*  < > 2.66* 2.19* 1.99* 2.06* 1.93* 1.76*  CALCIUM 7.3* 6.9* 7.0*  < > 7.1* 7.7* 7.7* 7.5* 7.5* 7.4*  MG 1.4* 1.3* 1.6*  --   --   2.0 2.0  --  2.0 2.0  PHOS 4.0 4.8* 4.7*  --  4.3  --  2.8  --   --   --   < > = values in this interval not displayed.  Liver Function Tests:  Recent Labs Lab 12/09/15 0427  ALBUMIN 1.9*   No results for input(s): LIPASE, AMYLASE in the last 168 hours. No results for input(s): AMMONIA in the last 168 hours.  CBC:  Recent Labs Lab 12/06/15 2007 12/07/15 8250 12/09/15 0427 12/09/15 2034 12/10/15 0816  WBC 10.5 17.1* 16.9* 18.5* 18.6*  NEUTROABS  --   --   --  16.2* 16.6*  HGB 4.8* 8.3* 6.3* 8.2* 8.7*  HCT 15.0* 25.1* 19.8* 25.0* 26.6*  MCV 78.1* 81.8 83.4 84.1 84.5  PLT 85* 76* 90* 86* 105*    Cardiac Enzymes:  Recent Labs Lab 12/06/15 1647 12/06/15 2007 12/07/15 0622 12/07/15 0658 12/07/15 1418  CKTOTAL  --   --   --   --  104  TROPONINI 2.41* 22.40* 62.60* 57.81*  --     BNP: Invalid input(s): POCBNP  CBG:  Recent Labs Lab 12/11/15 1319 12/11/15 1819 12/11/15 2137 12/12/15 0250 12/12/15  0630  GLUCAP 189* 209* 190* 218* 192*    Microbiology: Results for orders placed or performed during the hospital encounter of 12/05/15  Culture, respiratory (NON-Expectorated)     Status: None   Collection Time: 12/06/15  4:05 PM  Result Value Ref Range Status   Specimen Description NASAL SWAB  Final   Special Requests NONE  Final   Gram Stain RARE WBC SEEN FEW GRAM POSITIVE COCCI   Final   Culture HEAVY GROWTH STAPHYLOCOCCUS AUREUS  Final   Report Status 12/09/2015 FINAL  Final   Organism ID, Bacteria STAPHYLOCOCCUS AUREUS  Final      Susceptibility   Staphylococcus aureus - MIC*    CIPROFLOXACIN 2 INTERMEDIATE Intermediate     GENTAMICIN <=0.5 SENSITIVE Sensitive     OXACILLIN <=0.25 SENSITIVE Sensitive     TETRACYCLINE <=1 SENSITIVE Sensitive     VANCOMYCIN 1 SENSITIVE Sensitive     TRIMETH/SULFA <=10 SENSITIVE Sensitive     RIFAMPIN <=0.5 SENSITIVE Sensitive     Inducible Clindamycin NEGATIVE Sensitive     * HEAVY GROWTH STAPHYLOCOCCUS AUREUS   Culture, blood (Routine X 2) w Reflex to ID Panel     Status: None (Preliminary result)   Collection Time: 12/07/15 12:09 AM  Result Value Ref Range Status   Specimen Description BLOOD BLOOD RIGHT HAND  Final   Special Requests BOTTLES DRAWN AEROBIC AND ANAEROBIC 10CC  Final   Culture NO GROWTH 4 DAYS  Final   Report Status PENDING  Incomplete  Culture, blood (Routine X 2) w Reflex to ID Panel     Status: None (Preliminary result)   Collection Time: 12/07/15 12:09 AM  Result Value Ref Range Status   Specimen Description BLOOD BLOOD LEFT HAND  Final   Special Requests BOTTLES DRAWN AEROBIC AND ANAEROBIC 10CC  Final   Culture NO GROWTH 4 DAYS  Final   Report Status PENDING  Incomplete  Urine culture     Status: None   Collection Time: 12/07/15 12:11 AM  Result Value Ref Range Status   Specimen Description URINE, CLEAN CATCH  Final   Special Requests NONE  Final   Culture NO GROWTH 1 DAY  Final   Report Status 12/08/2015 FINAL  Final  MRSA PCR Screening     Status: None   Collection Time: 12/08/15  9:12 AM  Result Value Ref Range Status   MRSA by PCR NEGATIVE NEGATIVE Final    Comment:        The GeneXpert MRSA Assay (FDA approved for NASAL specimens only), is one component of a comprehensive MRSA colonization surveillance program. It is not intended to diagnose MRSA infection nor to guide or monitor treatment for MRSA infections.   C difficile quick scan w PCR reflex     Status: None   Collection Time: 12/11/15  1:54 PM  Result Value Ref Range Status   C Diff antigen NEGATIVE NEGATIVE Final   C Diff toxin NEGATIVE NEGATIVE Final   C Diff interpretation Negative for C. difficile  Final    Coagulation Studies: No results for input(s): LABPROT, INR in the last 72 hours.  Urinalysis: No results for input(s): COLORURINE, LABSPEC, PHURINE, GLUCOSEU, HGBUR, BILIRUBINUR, KETONESUR, PROTEINUR, UROBILINOGEN, NITRITE, LEUKOCYTESUR in the last 72 hours.  Invalid input(s):  APPERANCEUR    Imaging: No results found.   Medications:   . amiodarone 30 mg/hr (12/12/15 0800)  . feeding supplement (VITAL AF 1.2 CAL) Stopped (12/11/15 0000)  . fentaNYL infusion INTRAVENOUS 300 mcg/hr (12/12/15 0800)  .  lactated ringers Stopped (12/12/15 0600)  . nitroGLYCERIN    . norepinephrine (LEVOPHED) Adult infusion 1 mcg/min (12/06/15 2312)  . phenylephrine (NEO-SYNEPHRINE) Adult infusion Stopped (12/11/15 2300)   . amiodarone  150 mg Intravenous Once  . antiseptic oral rinse  7 mL Mouth Rinse QID  . bisacodyl  10 mg Rectal Once  .  ceFAZolin (ANCEF) IV  2 g Intravenous On Call to OR  . chlorhexidine gluconate  15 mL Mouth Rinse BID  . feeding supplement (PRO-STAT SUGAR FREE 64)  30 mL Per Tube QID  . free water  30 mL Per Tube 6 times per day  . insulin aspart  0-15 Units Subcutaneous Q4H  . insulin regular  8 Units Subcutaneous Once  . ipratropium-albuterol  3 mL Nebulization Q6H  . metoprolol tartrate  50 mg Per Tube BID  . midazolam      . pantoprazole sodium  40 mg Per Tube Daily  . piperacillin-tazobactam (ZOSYN)  IV  3.375 g Intravenous 3 times per day   acetaminophen **OR** [DISCONTINUED] acetaminophen, albuterol, bisacodyl, fentaNYL (SUBLIMAZE) injection, fentaNYL (SUBLIMAZE) injection, heparin, hydrALAZINE, labetalol, midazolam, ondansetron **OR** ondansetron (ZOFRAN) IV, sennosides  Assessment/ Plan:  Mr. Stephen Camacho is a 70 y.o. black  male with hypertension, hyperlipidemia, diabetes mellitus type II, coronary artery disease status post 2 vessel CABG, peripheral arterial disease status post right above-the-knee amputation, seizure disorder, lung cancer, prostate cancer, history of CVA, who was admitted to [facility] on 12/05/2015 for ischemic left lower extremity. Hospital course complicated by acute renal failure, acute respiratory failure, new CVAs, myocardial infarction.  1. Acute renal failure:  ATN from contrast exposure. nonoliguric urine output. renal  function stable. Baseline creatinine of 0.83. Electrolytes currently at goal.  - No acute indication for dialysis at this time. Continue monitor closely for dialysis need. Nonoliguric urine output. Monitor volume status, respiratory status, urine output, electrolytes and kidney function - Renally dose all medications - IVF off - restart when IV access available.   2. Acute respiratory failure: Status post intubation and on mechanical ventilation. With acute myocardial infarction and acute CVA - echocardiogram with diastolic failure.  - Appreciate cardiology and pulmonary input.   3. Ischemic left lower extremity: peripheral vascular disease: and now with acute DVT.  - Appreciate vascular input.   4. Anemia with renal failure: PRBC transfusion 2/14. Hemoglobin 8.7. Platelets trending upward, 105. - check CBC   LOS: Colona, Stephen Camacho 2/17/20179:09 AM

## 2015-12-12 NOTE — Transfer of Care (Signed)
Immediate Anesthesia Transfer of Care Note  Patient: Stephen Camacho  Procedure(s) Performed: Procedure(s): AMPUTATION ABOVE KNEE (Left)  Patient Location: ICU  Anesthesia Type:General  Level of Consciousness: sedated  Airway & Oxygen Therapy: Patient Spontanous Breathing and Patient placed on Ventilator (see vital sign flow sheet for setting)  Post-op Assessment: Report given to RN and Post -op Vital signs reviewed and stable  Post vital signs: Reviewed and stable  Last Vitals:  Filed Vitals:   12/12/15 1730 12/12/15 1745  BP: 123/71 122/69  Pulse: 92 92  Temp:    Resp: 21 20    Complications: No apparent anesthesia complications

## 2015-12-12 NOTE — Progress Notes (Signed)
[facility] NOTE  Pharmacy Consult for amiodarone medication interactions Indication: Amiodarone infusion    No Known Allergies  Patient Measurements: Height: '6\' 4"'$  (193 cm) Weight: 172 lb 6.4 oz (78.2 kg) IBW/kg (Calculated) : 86.8  Vital Signs: Temp: 98.3 F (36.8 C) (02/17 0800) Temp Source: Oral (02/17 0800) BP: 128/80 mmHg (02/17 0800) Pulse Rate: 95 (02/17 0800) Intake/Output from previous day: 02/16 0701 - 02/17 0700 In: 3928.6 [I.V.:3393.6; NG/GT:285; IV Piggyback:250] Out: 1400 [Urine:1400] Intake/Output from this shift: Total I/O In: 93.4 [I.V.:93.4] Out: -  Vent settings for last 24 hours: Vent Mode:  [-] PRVC FiO2 (%):  [30 %] 30 % Set Rate:  [13 bmp] 13 bmp Vt Set:  [500 mL] 500 mL PEEP:  [5 cmH20] 5 cmH20 Plateau Pressure:  [10 cmH20-16 cmH20] 16 cmH20  Labs:  Recent Labs  12/09/15 2034  12/10/15 0816 12/10/15 2331 12/11/15 0508 12/11/15 2246 12/12/15 0501 12/12/15 1050  WBC 18.5*  --  18.6*  --   --   --   --  21.0*  HGB 8.2*  --  8.7*  --   --   --   --  8.0*  HCT 25.0*  --  26.6*  --   --   --   --  25.1*  PLT 86*  --  105*  --   --   --   --  143*  CREATININE  --   < >  --  1.99* 2.06* 1.93* 1.76*  --   MG  --   < >  --  2.0  --  2.0 2.0  --   PHOS  --   --   --  2.8  --   --  2.4*  --   < > = values in this interval not displayed. Estimated Creatinine Clearance: 43.2 mL/min (by C-G formula based on Cr of 1.76).   Recent Labs  12/12/15 0250 12/12/15 0630 12/12/15 1004  GLUCAP 218* 192* 172*    Microbiology: Recent Results (from the past 720 hour(s))  Culture, respiratory (NON-Expectorated)     Status: None   Collection Time: 12/06/15  4:05 PM  Result Value Ref Range Status   Specimen Description NASAL SWAB  Final   Special Requests NONE  Final   Gram Stain RARE WBC SEEN FEW GRAM POSITIVE COCCI   Final   Culture HEAVY GROWTH STAPHYLOCOCCUS AUREUS  Final   Report Status 12/09/2015 FINAL  Final   Organism  ID, Bacteria STAPHYLOCOCCUS AUREUS  Final      Susceptibility   Staphylococcus aureus - MIC*    CIPROFLOXACIN 2 INTERMEDIATE Intermediate     GENTAMICIN <=0.5 SENSITIVE Sensitive     OXACILLIN <=0.25 SENSITIVE Sensitive     TETRACYCLINE <=1 SENSITIVE Sensitive     VANCOMYCIN 1 SENSITIVE Sensitive     TRIMETH/SULFA <=10 SENSITIVE Sensitive     RIFAMPIN <=0.5 SENSITIVE Sensitive     Inducible Clindamycin NEGATIVE Sensitive     * HEAVY GROWTH STAPHYLOCOCCUS AUREUS  Culture, blood (Routine X 2) w Reflex to ID Panel     Status: None (Preliminary result)   Collection Time: 12/07/15 12:09 AM  Result Value Ref Range Status   Specimen Description BLOOD BLOOD RIGHT HAND  Final   Special Requests BOTTLES DRAWN AEROBIC AND ANAEROBIC 10CC  Final   Culture NO GROWTH 4 DAYS  Final   Report Status PENDING  Incomplete  Culture, blood (Routine X 2) w Reflex to ID Panel  Status: None (Preliminary result)   Collection Time: 12/07/15 12:09 AM  Result Value Ref Range Status   Specimen Description BLOOD BLOOD LEFT HAND  Final   Special Requests BOTTLES DRAWN AEROBIC AND ANAEROBIC 10CC  Final   Culture NO GROWTH 4 DAYS  Final   Report Status PENDING  Incomplete  Urine culture     Status: None   Collection Time: 12/07/15 12:11 AM  Result Value Ref Range Status   Specimen Description URINE, CLEAN CATCH  Final   Special Requests NONE  Final   Culture NO GROWTH 1 DAY  Final   Report Status 12/08/2015 FINAL  Final  MRSA PCR Screening     Status: None   Collection Time: 12/08/15  9:12 AM  Result Value Ref Range Status   MRSA by PCR NEGATIVE NEGATIVE Final    Comment:        The GeneXpert MRSA Assay (FDA approved for NASAL specimens only), is one component of a comprehensive MRSA colonization surveillance program. It is not intended to diagnose MRSA infection nor to guide or monitor treatment for MRSA infections.   C difficile quick scan w PCR reflex     Status: None   Collection Time: 12/11/15   1:54 PM  Result Value Ref Range Status   C Diff antigen NEGATIVE NEGATIVE Final   C Diff toxin NEGATIVE NEGATIVE Final   C Diff interpretation Negative for C. difficile  Final    Medications:  Scheduled:  . amiodarone  150 mg Intravenous Once  . antiseptic oral rinse  7 mL Mouth Rinse QID  . bisacodyl  10 mg Rectal Once  .  ceFAZolin (ANCEF) IV  2 g Intravenous On Call to OR  . chlorhexidine gluconate  15 mL Mouth Rinse BID  . feeding supplement (PRO-STAT SUGAR FREE 64)  30 mL Per Tube QID  . free water  30 mL Per Tube 6 times per day  . insulin aspart  0-15 Units Subcutaneous Q4H  . insulin regular  8 Units Subcutaneous Once  . ipratropium-albuterol  3 mL Nebulization Q6H  . metoprolol tartrate  50 mg Per Tube BID  . pantoprazole sodium  40 mg Per Tube Daily  . piperacillin-tazobactam (ZOSYN)  IV  3.375 g Intravenous 3 times per day   Infusions:  . amiodarone 30 mg/hr (12/12/15 0800)  . feeding supplement (VITAL AF 1.2 CAL) Stopped (12/11/15 0000)  . fentaNYL infusion INTRAVENOUS 300 mcg/hr (12/12/15 1055)  . lactated ringers Stopped (12/12/15 0600)  . nitroGLYCERIN    . norepinephrine (LEVOPHED) Adult infusion 1 mcg/min (12/06/15 2312)  . phenylephrine (NEO-SYNEPHRINE) Adult infusion Stopped (12/11/15 2300)    Assessment: Pharmacy consulted for amiodarone drug-drug interactions for 70 yo male on amiodarone infusion.   Plan:  No adjustments warranted at this time.   Pharmacy will continue to monitor and adjust per consult.    Simpson,Michael L 12/12/2015,12:19 PM

## 2015-12-12 NOTE — Consult Note (Signed)
PULMONARY / CRITICAL CARE MEDICINE   Name: Stephen Camacho MRN: [id number] DOB: 08-05-1946    ADMISSION DATE:  12/05/2015   CONSULTATION DATE: 12/06/2015   REFERRING MD:  Delana Meyer  CHIEF COMPLAINT: Acute respiratory failure, left fem-pop bypass graft occlusion and SOB  REVIEW OF SYSTEMS:   Unable to obtain, patient is intubated and sedated  SUBJECTIVE:  Off levophed, started having increase UOP, and did not require any dialysis. Had sustained V. tach last night requiring amiodarone and metoprolol, converted back to normal sinus rhythm this today. Electrolytes checked last night were within except for ranges. Noted to have fever last night, scheduled for left leg amputation today  VITAL SIGNS: BP 128/80 mmHg  Pulse 95  Temp(Src) 98.3 F (36.8 C) (Oral)  Resp 14  Ht '6\' 4"'$  (1.93 m)  Wt 172 lb 6.4 oz (78.2 kg)  BMI 20.99 kg/m2  SpO2 100%  HEMODYNAMICS:    VENTILATOR SETTINGS: Vent Mode:  [-] PRVC FiO2 (%):  [30 %] 30 % Set Rate:  [13 bmp] 13 bmp Vt Set:  [500 mL] 500 mL PEEP:  [5 cmH20] 5 cmH20 Plateau Pressure:  [10 cmH20-16 cmH20] 16 cmH20  INTAKE / OUTPUT: I/O last 3 completed shifts: In: 6203.1 [I.V.:4813.1; NG/GT:840; IV Piggyback:550] Out: 3250 [Urine:3250]  PHYSICAL EXAMINATION: General: Frail looking male,on vent intubated Neuro: no significant purposeful movement HEENT: PERRLA, Oral mucosa with moderate amount of thick secretions  Cardiovascular: ST, s1/s2, no MRG Lungs:  Labored, bilateral airflow, scattered rhonchi in all lung fields Abdomen: Soft, NT/ND, +BS X4 Musculoskeletal:  Right AKA, left leg with well healed bypass scar Extremities: left leg cold; no palpable pulses, +femoral pulse Skin: Cold; mild discoloration in left leg  LABS:  BMET  Recent Labs Lab 12/11/15 0508 12/11/15 2246 12/12/15 0501  NA 142 143 144  K 3.8 3.6 4.0  CL 110 110 112*  CO2 '29 23 27  '$ BUN 35* 31* 31*  CREATININE 2.06* 1.93* 1.76*  GLUCOSE 234* 264* 228*     Electrolytes  Recent Labs Lab 12/08/15 0533  12/09/15 0427  12/10/15 2331 12/11/15 0508 12/11/15 2246 12/12/15 0501  CALCIUM 7.0*  < > 7.1*  < > 7.7* 7.5* 7.5* 7.4*  MG 1.6*  --   --   < > 2.0  --  2.0 2.0  PHOS 4.7*  --  4.3  --  2.8  --   --   --   < > = values in this interval not displayed.  CBC  Recent Labs Lab 12/09/15 0427 12/09/15 2034 12/10/15 0816  WBC 16.9* 18.5* 18.6*  HGB 6.3* 8.2* 8.7*  HCT 19.8* 25.0* 26.6*  PLT 90* 86* 105*    Coag's  Recent Labs Lab 12/05/15 2053 12/06/15 0332 12/06/15 0832  APTT 132* 79* 72*    Sepsis Markers  Recent Labs Lab 12/06/15 1830  12/07/15 0304 12/07/15 0623 12/07/15 1418  LATICACIDVEN  --   < > 3.2* 4.8* 3.0*  PROCALCITON 0.54  --   --   --   --   < > = values in this interval not displayed.  ABG  Recent Labs Lab 12/06/15 1935 12/06/15 2316 12/07/15 0854  PHART 7.52* 7.43 7.49*  PCO2ART 26* 39 36  PO2ART 168* 173* 179*    Liver Enzymes  Recent Labs Lab 12/09/15 0427  ALBUMIN 1.9*    Cardiac Enzymes  Recent Labs Lab 12/06/15 2007 12/07/15 0622 12/07/15 0658  TROPONINI 22.40* 62.60* 57.81*    Glucose  Recent Labs Lab 12/11/15  1319 12/11/15 1819 12/11/15 2137 12/12/15 0250 12/12/15 0630 12/12/15 1004  GLUCAP 189* 209* 190* 218* 192* 172*    Imaging No results found.  STUDIES:    CULTURES: 02/11 Blood x2>> Urine>> Respiratory>>heavy growth staph  ANTIBIOTICS: Piperacillin-tazobactam 02/11> Vancomycin 02/11>  SIGNIFICANT EVENTS: 02/07>ED with SOB and left calf pain>treated and released 02/09>ED with cold left foot and calf pain>CT angio>complete occlusion of the left fem-pop bypass graft>admitted 02/11>OR for angiogram with thrombolysis, thrombectomy of left peroneal artery, tibioperoneal trunk, popliteal artery, and femoral to popliteal bypass graft and percutaneous transluminal angioplasty of the left peroneal artery and distal bypass anastomosis and  popliteal artery Post-op: residual thrombosis of femoral to popliteal bypass graft and all tibial vessels; acute respiratory failure>Intubated   LINES/TUBES: Right IJ tlc 02/10> ETT 02/11> L Fem vascath 2/13>>  DISCUSSION: 70 YO male with acute complete occlusion of the  left fem-pop bypass graft s/p thrombolysis/thrombectomy/percuteneous transluminal angioplasty with residual thrombosis, sepsis secondary to possible necrosis from left leg thrombosis, and  acute hypoxic respiratory failure secondary to sepsis and complicated by acute NSTEMI and worsening renal fcn, now slowly improving, still critically ill due to left leg. .   ASSESSMENT / PLAN:  PULMONARY A: Acute hypoxic respiratory failure Possible aspiration pneumonia-Copious amount of secretions noted in airway during intubation-now with improvement.  Severe metabolic acidosis - resolving well H/O Lung cancer s/p right lung lobectomy P:   -Full vent support-Settings: PRVC, wean as tolerated.  -ABG pending -VAP prevention protocol -F/U CXR as needed -Albuterol and Duoneb prn -Abx as above -F/U cultures - respiratory now with staph, currently being treated.   CARDIOVASCULAR-NSTEMI A:  Bradycardia-Resolved; Now in ST Septic shock H/O hypertension NSTEMI Afib Sustained Vtach - 12 mins, broke with amino and metoprolol 2/17 P:  -IV fluid boluses until CVP GE12 -Levophed gtt and titrate to MAP goal >65 -Hemodynamics per ICU protocol -increasing metoprolol for afib -cont with amio gtt, check phos.  -cardiology following - rec low dose ASA -check ECHO>>EF 55-60%, normal LV wall motion  RENAL A:   AKI secondary procedural IV dye and volume depletion; baseline creatinine 0.83 P:   -IV fluids, LR off at this time.  -Trend creatinine - currently improving -Renally dose meds -nephro following - vascath placed, no dialysis at this time due to increase UOP and improving renal fcn.   GASTROINTESTINAL A:    Constipation P:   -PPI for GI prophylaxis -Monitor BMs -cont with TF  HEMATOLOGIC A:   Left leg ischemia-Complete Occlusion of the left fem-pop bypass graft s/p thrombolysis/PTA with residual thrombosis Right neck hematoma s/p TLC placement Severe PVD s/p Right AKA P:  -Vascular surgery following - plan for possible surgical intervention of LLE today -Anticoagulation per vasc surgery -Monitor left limb for re-thrombosis -Monitor for bleeding-transfuse as needed, -DVT - heparin on hold, due to neck hematoma  INFECTIOUS A:   Septic shock-likely source is left leg ischemia/necrosis from massive thrombus P:   -Antibiotics as above -F/U cultures -IV fluids and pressure to keep MAP>65 -F/U lactic acid level and procalcitonin  ENDOCRINE A:   Type 2 diabetes mellitus   P:   -Blood glucose monitoring with SSI coverage -Hold oral hypoglycemic agents  NEUROLOGIC A:   Acute metabolic encephalopathy H/o CVA H/O Seizures-Not on home seizure meds P:   RASS goal: -2 -Fentanyl infusion and versed  Prn for vent sedation -Monitor for seizure activity   The Patient requires high complexity decision making for assessment and support, frequent evaluation and titration  of therapies, application of advanced monitoring technologies and extensive interpretation of multiple databases.  Critical Care Time devoted to patient care services described in this note is 35 minutes.   Overall, patient is critically ill, prognosis is guarded.  Patient with Multiorgan failure and at high risk for cardiac arrest and death.    Vilinda Boehringer, MD [facility] Pager [phone] (please enter 7-digits) On Call Pager - [phone] (please enter 7-digits)

## 2015-12-12 NOTE — Op Note (Signed)
  [facility]   OPERATIVE NOTE   PROCEDURE: left above-the-knee amputation  PRE-OPERATIVE DIAGNOSIS: left foot gangrene  POST-OPERATIVE DIAGNOSIS: same as above  SURGEON:  Leotis Pain, MD  ASSISTANT(S): Ms. Hezzie Bump  ANESTHESIA: general  ESTIMATED BLOOD LOSS: 200 cc  FINDING(S): Gangrene left foot  SPECIMEN(S):  left above-the-knee amputation  INDICATIONS:   Stephen Camacho is a 70 y.o. male who presents with left foot gangrene.  The patient is scheduled for a left above-the-knee amputation.  I discussed in depth with the patient the risks, benefits, and alternatives to this procedure.  The patient is aware that the risk of this operation included but are not limited to:  bleeding, infection, myocardial infarction, stroke, death, failure to heal amputation wound, and possible need for more proximal amputation.  The patient is aware of the risks and agrees proceed forward with the procedure.  DESCRIPTION: After full informed written consent was obtained from the patient, the patient was taken to the operating room, and placed supine upon the operating table.  Prior to induction, the patient received IV antibiotics.  The patient was then prepped and draped in the standard fashion for an above-the-knee amputation.  After obtaining adequate anesthesia, the patient was prepped and draped in the standard fashion for a above-the-knee amputation.  I marked out the anterior and posterior flaps for a fish-mouth type of amputation.  I made the incisions for these flaps, and then dissected through the subcutaneous tissue, fascia, and muscles circumferentially.  I elevated  the periosteal tissue 4-5 cm more proximal than the anterior skin flap.  I then transected the femur with a power saw at this level.  Then I smoothed out the rough edges of the bone.  At this point, the specimen was passed off the field as the above-the-knee amputation.  At this point, I clamped all  visibly bleeding arteries and veins using a combination of suture ligation with silk suture and electrocautery.   Bleeding continued to be controlled with electrocautery and suture ligature.  The stump was washed off with sterile normal saline and no further active bleeding was noted.  I reapproximated the anterior and posterior fascia  with interrupted stitches of 0 Vicryl.  This was completed along the entire length of anterior and posterior fascia until there were no more loose space in the fascial line. The subcutaneous tissue was then approximated with 2-0 vicryl sutures. The skin was then  reapproximated with staples.  The stump was washed off and dried.  The incision was dressed with Xeroform and ABD pads, and  then fluffs were applied.  Kerlix was wrapped around the leg and then gently an ACE wrap was applied.  A large Ioban was then placed over the ACE wrap to secure the dressing. The patient was then awakened and take to the recovery room in stable condition.   COMPLICATIONS: None  CONDITION:  Unchanged  [name]  12/12/2015, 4:00 PM

## 2015-12-12 NOTE — Progress Notes (Signed)
Dr Lucky Cowboy states to get a hemoglobin in the am on the 18th. No other orders given at this time.

## 2015-12-12 NOTE — Progress Notes (Signed)
Pt went into vtach at 2231 for 69mn. Elink contacted, '5mg'$  metoprolol IV push given, pacer pads placed, labs drawn, and amiodarone stared. Pt converted back to normal sinus at 2243 shortly after metoprolol was given.   Neo held as pts B/P was within normal limits. Tube feedings held at 0000.  Held LR at 0600 due to limited access.  Held 0600 ancef due to limited access as well as pre op abx.  Dr.Dew informed of temp spike and run of vtach, states anesthesia will make decision to proceed with left leg amputation today.

## 2015-12-12 NOTE — Progress Notes (Signed)
Pharmacy Antibiotic Note  Stephen Camacho is a 70 y.o. male admitted on 12/05/2015 with ischemic leg.  Pharmacy has been consulted for Zosyn dosing. Patient previously received vancomycin 2/11-2/16.   Plan:  Continue Zosyn 3.375 g IV q8h per EI protocol.  Height: '6\' 4"'$  (193 cm) Weight: 172 lb 6.4 oz (78.2 kg) IBW/kg (Calculated) : 86.8  Temp (24hrs), Avg:100.1 F (37.8 C), Min:98.3 F (36.8 C), Max:102.5 F (39.2 C)   Recent Labs Lab 12/06/15 1508 12/06/15 2007 12/07/15 0304  12/07/15 8563 12/07/15 0658 12/07/15 1418  12/09/15 0427 12/09/15 2034 12/10/15 0442 12/10/15 0816 12/10/15 2331 12/11/15 0508 12/11/15 2246 12/12/15 0501 12/12/15 1050  WBC  --  10.5  --   --   --  17.1*  --   --  16.9* 18.5*  --  18.6*  --   --   --   --  21.0*  CREATININE  --  2.69*  --   < >  --   --   --   < > 2.66*  --  2.19*  --  1.99* 2.06* 1.93* 1.76*  --   LATICACIDVEN 17.8* 7.5* 3.2*  --  4.8*  --  3.0*  --   --   --   --   --   --   --   --   --   --   VANCOTROUGH  --   --   --   --   --   --   --   --   --   --  9*  --   --   --   --   --   --   < > = values in this interval not displayed.  Estimated Creatinine Clearance: 43.2 mL/min (by C-G formula based on Cr of 1.76).    No Known Allergies  Antimicrobials this admission: Zosyn 2/11 >>  Vancomycin 2/11 >> 02/16   Microbiology results: Results for orders placed or performed during the hospital encounter of 12/05/15  Culture, respiratory (NON-Expectorated)     Status: None   Collection Time: 12/06/15  4:05 PM  Result Value Ref Range Status   Specimen Description NASAL SWAB  Final   Special Requests NONE  Final   Gram Stain RARE WBC SEEN FEW GRAM POSITIVE COCCI   Final   Culture HEAVY GROWTH STAPHYLOCOCCUS AUREUS  Final   Report Status 12/09/2015 FINAL  Final   Organism ID, Bacteria STAPHYLOCOCCUS AUREUS  Final      Susceptibility   Staphylococcus aureus - MIC*    CIPROFLOXACIN 2 INTERMEDIATE Intermediate    GENTAMICIN <=0.5 SENSITIVE Sensitive     OXACILLIN <=0.25 SENSITIVE Sensitive     TETRACYCLINE <=1 SENSITIVE Sensitive     VANCOMYCIN 1 SENSITIVE Sensitive     TRIMETH/SULFA <=10 SENSITIVE Sensitive     RIFAMPIN <=0.5 SENSITIVE Sensitive     Inducible Clindamycin NEGATIVE Sensitive     * HEAVY GROWTH STAPHYLOCOCCUS AUREUS  Culture, blood (Routine X 2) w Reflex to ID Panel     Status: None (Preliminary result)   Collection Time: 12/07/15 12:09 AM  Result Value Ref Range Status   Specimen Description BLOOD BLOOD RIGHT HAND  Final   Special Requests BOTTLES DRAWN AEROBIC AND ANAEROBIC 10CC  Final   Culture NO GROWTH 4 DAYS  Final   Report Status PENDING  Incomplete  Culture, blood (Routine X 2) w Reflex to ID Panel     Status: None (Preliminary result)   Collection Time: 12/07/15  12:09 AM  Result Value Ref Range Status   Specimen Description BLOOD BLOOD LEFT HAND  Final   Special Requests BOTTLES DRAWN AEROBIC AND ANAEROBIC 10CC  Final   Culture NO GROWTH 4 DAYS  Final   Report Status PENDING  Incomplete  Urine culture     Status: None   Collection Time: 12/07/15 12:11 AM  Result Value Ref Range Status   Specimen Description URINE, CLEAN CATCH  Final   Special Requests NONE  Final   Culture NO GROWTH 1 DAY  Final   Report Status 12/08/2015 FINAL  Final  MRSA PCR Screening     Status: None   Collection Time: 12/08/15  9:12 AM  Result Value Ref Range Status   MRSA by PCR NEGATIVE NEGATIVE Final    Comment:        The GeneXpert MRSA Assay (FDA approved for NASAL specimens only), is one component of a comprehensive MRSA colonization surveillance program. It is not intended to diagnose MRSA infection nor to guide or monitor treatment for MRSA infections.   C difficile quick scan w PCR reflex     Status: None   Collection Time: 12/11/15  1:54 PM  Result Value Ref Range Status   C Diff antigen NEGATIVE NEGATIVE Final   C Diff toxin NEGATIVE NEGATIVE Final   C Diff  interpretation Negative for C. difficile  Final    Pharmacy will continue to monitor and adjust per consult.  Currie Paris, PharmD 12/12/2015

## 2015-12-13 ENCOUNTER — Inpatient Hospital Stay: Payer: PPO

## 2015-12-13 DIAGNOSIS — R7989 Other specified abnormal findings of blood chemistry: Secondary | ICD-10-CM

## 2015-12-13 LAB — CBC
HCT: 21.2 % — ABNORMAL LOW (ref 40.0–52.0)
HEMATOCRIT: 22.8 % — AB (ref 40.0–52.0)
HEMOGLOBIN: 7.1 g/dL — AB (ref 13.0–18.0)
HEMOGLOBIN: 6.8 g/dL — AB (ref 13.0–18.0)
MCH: 27.4 pg (ref 26.0–34.0)
MCH: 27.1 pg (ref 26.0–34.0)
MCHC: 31.4 g/dL — AB (ref 32.0–36.0)
MCHC: 31.9 g/dL — ABNORMAL LOW (ref 32.0–36.0)
MCV: 87.3 fL (ref 80.0–100.0)
MCV: 85 fL (ref 80.0–100.0)
Platelets: 174 10*3/uL (ref 150–440)
Platelets: 187 10*3/uL (ref 150–440)
RBC: 2.61 MIL/uL — AB (ref 4.40–5.90)
RBC: 2.5 MIL/uL — AB (ref 4.40–5.90)
RDW: 16.1 % — ABNORMAL HIGH (ref 11.5–14.5)
RDW: 15.9 % — ABNORMAL HIGH (ref 11.5–14.5)
WBC: 17.6 10*3/uL — AB (ref 3.8–10.6)
WBC: 19.5 10*3/uL — ABNORMAL HIGH (ref 3.8–10.6)

## 2015-12-13 LAB — BLOOD GAS, ARTERIAL
ACID-BASE EXCESS: 3 mmol/L (ref 0.0–3.0)
Allens test (pass/fail): POSITIVE — AB
Bicarbonate: 21.4 mEq/L (ref 21.0–28.0)
FIO2: 0.21
O2 SAT: 94.6 %
Patient temperature: 37
pCO2 arterial: 19 mmHg — CL (ref 32.0–48.0)
pH, Arterial: 7.66 (ref 7.350–7.450)
pO2, Arterial: 56 mmHg — ABNORMAL LOW (ref 83.0–108.0)

## 2015-12-13 LAB — GLUCOSE, CAPILLARY
GLUCOSE-CAPILLARY: 279 mg/dL — AB (ref 65–99)
GLUCOSE-CAPILLARY: 246 mg/dL — AB (ref 65–99)
GLUCOSE-CAPILLARY: 230 mg/dL — AB (ref 65–99)
GLUCOSE-CAPILLARY: 269 mg/dL — AB (ref 65–99)
GLUCOSE-CAPILLARY: 203 mg/dL — AB (ref 65–99)
Glucose-Capillary: 255 mg/dL — ABNORMAL HIGH (ref 65–99)

## 2015-12-13 LAB — BASIC METABOLIC PANEL
ANION GAP: 6 (ref 5–15)
BUN: 34 mg/dL — ABNORMAL HIGH (ref 6–20)
CHLORIDE: 111 mmol/L (ref 101–111)
CO2: 28 mmol/L (ref 22–32)
CREATININE: 1.74 mg/dL — AB (ref 0.61–1.24)
Calcium: 7.3 mg/dL — ABNORMAL LOW (ref 8.9–10.3)
GFR calc non Af Amer: 38 mL/min — ABNORMAL LOW (ref >60)
GFR, EST AFRICAN AMERICAN: 44 mL/min — AB (ref >60)
Glucose, Bld: 298 mg/dL — ABNORMAL HIGH (ref 65–99)
Potassium: 3.8 mmol/L (ref 3.5–5.1)
SODIUM: 145 mmol/L (ref 135–145)

## 2015-12-13 LAB — CULTURE, BLOOD (ROUTINE X 2)
CULTURE: NO GROWTH
CULTURE: NO GROWTH

## 2015-12-13 LAB — PHOSPHORUS: PHOSPHORUS: 1.7 mg/dL — AB (ref 2.5–4.6)

## 2015-12-13 LAB — EXPECTORATED SPUTUM ASSESSMENT W GRAM STAIN, RFLX TO RESP C

## 2015-12-13 LAB — EXPECTORATED SPUTUM ASSESSMENT W REFEX TO RESP CULTURE

## 2015-12-13 LAB — TROPONIN I: TROPONIN I: 2.03 ng/mL — AB (ref <0.031)

## 2015-12-13 MED ORDER — FENTANYL CITRATE (PF) 100 MCG/2ML IJ SOLN
25.0000 ug | INTRAMUSCULAR | Status: DC | PRN
  Administered 2015-12-13 – 2015-12-14 (×2): 25 ug via INTRAVENOUS
  Administered 2015-12-14 (×4): 50 ug via INTRAVENOUS
  Filled 2015-12-13 (×6): qty 2

## 2015-12-13 MED ORDER — LACTULOSE 10 GM/15ML PO SOLN
30.0000 g | Freq: Once | ORAL | Status: AC
  Administered 2015-12-13: 30 g via NASOGASTRIC
  Filled 2015-12-13: qty 60

## 2015-12-13 MED ORDER — POTASSIUM PHOSPHATES 15 MMOLE/5ML IV SOLN
20.0000 meq | Freq: Once | INTRAVENOUS | Status: AC
  Administered 2015-12-13: 20 meq via INTRAVENOUS
  Filled 2015-12-13: qty 4.55

## 2015-12-13 MED ORDER — METOPROLOL TARTRATE 1 MG/ML IV SOLN
2.5000 mg | Freq: Four times a day (QID) | INTRAVENOUS | Status: DC
  Administered 2015-12-13 – 2015-12-20 (×26): 2.5 mg via INTRAVENOUS
  Filled 2015-12-13 (×27): qty 5

## 2015-12-13 MED ORDER — INSULIN GLARGINE 100 UNIT/ML ~~LOC~~ SOLN
10.0000 [IU] | Freq: Every day | SUBCUTANEOUS | Status: DC
  Filled 2015-12-13: qty 0.1

## 2015-12-13 MED ORDER — IPRATROPIUM-ALBUTEROL 0.5-2.5 (3) MG/3ML IN SOLN
3.0000 mL | RESPIRATORY_TRACT | Status: DC | PRN
  Administered 2015-12-15 – 2016-01-03 (×2): 3 mL via RESPIRATORY_TRACT
  Filled 2015-12-13 (×2): qty 3

## 2015-12-13 MED ORDER — ASPIRIN 300 MG RE SUPP
150.0000 mg | Freq: Every day | RECTAL | Status: DC
  Administered 2015-12-13 – 2015-12-16 (×2): 150 mg via RECTAL
  Filled 2015-12-13 (×4): qty 1

## 2015-12-13 MED ORDER — SENNOSIDES 8.8 MG/5ML PO SYRP
5.0000 mL | ORAL_SOLUTION | Freq: Two times a day (BID) | ORAL | Status: DC
  Administered 2015-12-13 – 2015-12-16 (×2): 5 mL
  Filled 2015-12-13 (×6): qty 5

## 2015-12-13 MED ORDER — HEPARIN SODIUM (PORCINE) 5000 UNIT/ML IJ SOLN
5000.0000 [IU] | Freq: Two times a day (BID) | INTRAMUSCULAR | Status: DC
  Administered 2015-12-13 – 2015-12-24 (×23): 5000 [IU] via SUBCUTANEOUS
  Filled 2015-12-13 (×24): qty 1

## 2015-12-13 MED ORDER — NALOXONE HCL 0.4 MG/ML IJ SOLN
0.4000 mg | INTRAMUSCULAR | Status: DC | PRN
  Administered 2015-12-13: 0.4 mg via INTRAVENOUS

## 2015-12-13 MED ORDER — MORPHINE SULFATE (PF) 2 MG/ML IV SOLN
2.0000 mg | INTRAVENOUS | Status: DC | PRN
  Administered 2015-12-13 (×2): 2 mg via INTRAVENOUS
  Filled 2015-12-13 (×2): qty 1

## 2015-12-13 MED ORDER — INSULIN GLARGINE 100 UNIT/ML ~~LOC~~ SOLN
5.0000 [IU] | Freq: Every day | SUBCUTANEOUS | Status: DC
  Administered 2015-12-13 – 2015-12-16 (×4): 5 [IU] via SUBCUTANEOUS
  Filled 2015-12-13 (×4): qty 0.05

## 2015-12-13 MED ORDER — NALOXONE HCL 0.4 MG/ML IJ SOLN
INTRAMUSCULAR | Status: AC
  Administered 2015-12-13: 0.4 mg via INTRAVENOUS
  Filled 2015-12-13: qty 1

## 2015-12-13 MED ORDER — ACETAMINOPHEN 650 MG RE SUPP
650.0000 mg | Freq: Four times a day (QID) | RECTAL | Status: DC | PRN
  Administered 2015-12-13: 650 mg via RECTAL
  Filled 2015-12-13 (×4): qty 1

## 2015-12-13 MED ORDER — SODIUM CHLORIDE 0.9 % IV SOLN
Freq: Once | INTRAVENOUS | Status: AC
  Administered 2015-12-13: 22:00:00 via INTRAVENOUS

## 2015-12-13 MED ORDER — FUROSEMIDE 10 MG/ML IJ SOLN
40.0000 mg | Freq: Once | INTRAMUSCULAR | Status: AC
  Administered 2015-12-13: 40 mg via INTRAVENOUS
  Filled 2015-12-13: qty 4

## 2015-12-13 NOTE — Anesthesia Postprocedure Evaluation (Signed)
Anesthesia Post Note  Patient: Azaria Bartell  Procedure(s) Performed: Procedure(s) (LRB): AMPUTATION ABOVE KNEE (Left)  Patient location during evaluation: PACU Anesthesia Type: General Level of consciousness: awake and alert Pain management: pain level controlled Vital Signs Assessment: post-procedure vital signs reviewed and stable Respiratory status: spontaneous breathing, nonlabored ventilation, respiratory function stable and patient connected to nasal cannula oxygen Cardiovascular status: blood pressure returned to baseline and stable Postop Assessment: no signs of nausea or vomiting Anesthetic complications: no    Last Vitals:  Filed Vitals:   12/13/15 0800 12/13/15 0900  BP: 127/73 126/73  Pulse: 96 98  Temp: 38.1 C   Resp: 21 21    Last Pain:  Filed Vitals:   12/13/15 0940  PainSc: Drew G Rickia [place]

## 2015-12-13 NOTE — Progress Notes (Signed)
Pt extubated at 12:05 per order by RT with RN at bedside. Pt tolerated well. Pt alert, oriented to self and place. Pt denies pain. Vss. Hr 89 02 sat 96 on room air rr 28 pulse 89 bp 136/91. Will continue to monitor.

## 2015-12-13 NOTE — Progress Notes (Addendum)
[facility] Progress Note Patient Name: Stephen Camacho DOB: 09-12-1946 MRN: [id number]   Date of Service  12/13/2015  HPI/Events of Note  Multiple issues: 1. ABG on RA = 7.66/19/56/21.4, 2. Patient c/o post op pain and 3. CXR c/w cardiomegaly, mild pulmonary congestion and atelectasis.   eICU Interventions  Will order: 1. Modify Fentanyl 25-50 mg IV Q 2 hours PRN. 2. [place] Oxygen. Keep sat >= 92%. 3. Lasix 40 mg IV now X 1.  4. Cycle Troponin.     Intervention Category Major Interventions: Hypoxemia - evaluation and management Intermediate Interventions: Respiratory distress - evaluation and management;Pain - evaluation and management  Siri Buege Eugene 12/13/2015, 7:51 PM

## 2015-12-13 NOTE — Progress Notes (Signed)
PHARMACY - CRITICAL CARE PROGRESS NOTE  Pharmacy Consult for amiodarone medication interactions Indication: Amiodarone infusion    No Known Allergies  Patient Measurements: Height: '6\' 4"'$  (193 cm) Weight: 162 lb 11.2 oz (73.8 kg) IBW/kg (Calculated) : 86.8  Vital Signs: Temp: 99.2 F (37.3 C) (02/18 0600) Temp Source: Axillary (02/18 0600) BP: 126/72 mmHg (02/18 0600) Pulse Rate: 98 (02/18 0600) Intake/Output from previous day: 02/17 0701 - 02/18 0700 In: 3499.2 [I.V.:3239.2; NG/GT:110; IV Piggyback:150] Out: 2435 [Urine:2435] Intake/Output from this shift:   Vent settings for last 24 hours: Vent Mode:  [-] PRVC FiO2 (%):  [30 %] 30 % Set Rate:  [13 bmp] 13 bmp Vt Set:  [500 mL] 500 mL PEEP:  [5 cmH20] 5 cmH20 Plateau Pressure:  [15 cmH20-17 cmH20] 17 cmH20  Labs:  Recent Labs  12/10/15 2331  12/11/15 2246 12/12/15 0501 12/12/15 1050 12/12/15 1717 12/13/15 0510  WBC  --   --   --   --  21.0* 17.7* 17.6*  HGB  --   --   --   --  8.0* 7.5* 7.1*  HCT  --   --   --   --  25.1* 23.4* 22.8*  PLT  --   --   --   --  143* 148* 174  CREATININE 1.99*  < > 1.93* 1.76*  --   --  1.74*  MG 2.0  --  2.0 2.0  --   --   --   PHOS 2.8  --   --  2.4*  --   --  1.7*  < > = values in this interval not displayed. Estimated Creatinine Clearance: 41.2 mL/min (by C-G formula based on Cr of 1.74).   Recent Labs  12/12/15 2159 12/13/15 0157 12/13/15 0559  GLUCAP 228* 279* 255*    Microbiology: Recent Results (from the past 720 hour(s))  Culture, respiratory (NON-Expectorated)     Status: None   Collection Time: 12/06/15  4:05 PM  Result Value Ref Range Status   Specimen Description NASAL SWAB  Final   Special Requests NONE  Final   Gram Stain RARE WBC SEEN FEW GRAM POSITIVE COCCI   Final   Culture HEAVY GROWTH STAPHYLOCOCCUS AUREUS  Final   Report Status 12/09/2015 FINAL  Final   Organism ID, Bacteria STAPHYLOCOCCUS AUREUS  Final      Susceptibility   Staphylococcus  aureus - MIC*    CIPROFLOXACIN 2 INTERMEDIATE Intermediate     GENTAMICIN <=0.5 SENSITIVE Sensitive     OXACILLIN <=0.25 SENSITIVE Sensitive     TETRACYCLINE <=1 SENSITIVE Sensitive     VANCOMYCIN 1 SENSITIVE Sensitive     TRIMETH/SULFA <=10 SENSITIVE Sensitive     RIFAMPIN <=0.5 SENSITIVE Sensitive     Inducible Clindamycin NEGATIVE Sensitive     * HEAVY GROWTH STAPHYLOCOCCUS AUREUS  Culture, blood (Routine X 2) w Reflex to ID Panel     Status: None   Collection Time: 12/07/15 12:09 AM  Result Value Ref Range Status   Specimen Description BLOOD BLOOD RIGHT HAND  Final   Special Requests BOTTLES DRAWN AEROBIC AND ANAEROBIC 10CC  Final   Culture NO GROWTH 6 DAYS  Final   Report Status 12/13/2015 FINAL  Final  Culture, blood (Routine X 2) w Reflex to ID Panel     Status: None   Collection Time: 12/07/15 12:09 AM  Result Value Ref Range Status   Specimen Description BLOOD BLOOD LEFT HAND  Final   Special Requests BOTTLES  DRAWN AEROBIC AND ANAEROBIC 10CC  Final   Culture NO GROWTH 6 DAYS  Final   Report Status 12/13/2015 FINAL  Final  Urine culture     Status: None   Collection Time: 12/07/15 12:11 AM  Result Value Ref Range Status   Specimen Description URINE, CLEAN CATCH  Final   Special Requests NONE  Final   Culture NO GROWTH 1 DAY  Final   Report Status 12/08/2015 FINAL  Final  MRSA PCR Screening     Status: None   Collection Time: 12/08/15  9:12 AM  Result Value Ref Range Status   MRSA by PCR NEGATIVE NEGATIVE Final    Comment:        The GeneXpert MRSA Assay (FDA approved for NASAL specimens only), is one component of a comprehensive MRSA colonization surveillance program. It is not intended to diagnose MRSA infection nor to guide or monitor treatment for MRSA infections.   C difficile quick scan w PCR reflex     Status: None   Collection Time: 12/11/15  1:54 PM  Result Value Ref Range Status   C Diff antigen NEGATIVE NEGATIVE Final   C Diff toxin NEGATIVE  NEGATIVE Final   C Diff interpretation Negative for C. difficile  Final    Medications:  Scheduled:  . amiodarone  150 mg Intravenous Once  . antiseptic oral rinse  7 mL Mouth Rinse QID  . bisacodyl  10 mg Rectal Once  . chlorhexidine gluconate  15 mL Mouth Rinse BID  . feeding supplement (PRO-STAT SUGAR FREE 64)  30 mL Per Tube QID  . free water  30 mL Per Tube 6 times per day  . insulin aspart  0-15 Units Subcutaneous Q4H  . insulin regular  8 Units Subcutaneous Once  . ipratropium-albuterol  3 mL Nebulization Q6H  . metoprolol tartrate  50 mg Per Tube BID  . pantoprazole sodium  40 mg Per Tube Daily  . piperacillin-tazobactam (ZOSYN)  IV  3.375 g Intravenous 3 times per day  . sodium chloride  500 mL Intravenous Once   Infusions:  . amiodarone 30 mg/hr (12/13/15 0600)  . feeding supplement (VITAL AF 1.2 CAL) Stopped (12/11/15 0000)  . fentaNYL infusion INTRAVENOUS 300 mcg/hr (12/13/15 0600)  . lactated ringers 100 mL/hr at 12/13/15 0600  . nitroGLYCERIN    . norepinephrine (LEVOPHED) Adult infusion Stopped (12/12/15 1850)  . phenylephrine (NEO-SYNEPHRINE) Adult infusion Stopped (12/11/15 2300)    Assessment: Pharmacy consulted for amiodarone drug-drug interactions for 70 yo male on amiodarone infusion.   Plan:  No adjustments warranted at this time.   Pharmacy will continue to monitor and adjust per consult.    [place] Clinical Pharmacist 12/13/2015,7:39 AM

## 2015-12-13 NOTE — Progress Notes (Signed)
PULMONARY / CRITICAL CARE MEDICINE   Name: Stephen Camacho MRN: [id number] DOB: 1946/02/22    ADMISSION DATE:  12/05/2015   CONSULTATION DATE: 12/06/2015   REFERRING MD:  Delana Meyer  CHIEF COMPLAINT: Acute respiratory failure, left fem-pop bypass graft occlusion and SOB  HISTORY OF PRESENT ILLNESS:  This is a 70 yo AA male with a PMH of lung cancer s/p right lung lobectomy, CAD s/p CABG, PVD s/p right AKA/left femoral-popliteal bypass in 2013, type 2 DM and CVA admitted with an acute occlusive thrombus of the femoral-popliteal bypass. Patient initially presented on 02/07 with left calf pain and sob. His dopplers, CXR and labs were unremarkable hence patient was discharged. On 02/09, patient presented with persistent calf pain and cold left foot. A CT angio revealed a complete occlusion of the left fem-po bypass graft. He was taken to the OR this morning for an angiogram with thrombolysis, thrombectomy of left peroneal artery, tibioperoneal trunk, popliteal artery, and femoral to popliteal bypass graft and percutaneous transluminal angioplasty of the left peroneal artery and distal bypass anastomosis and popliteal artery. Post-op, patient was hypoxic, tachypneic and minimally responsive. He was emergently intubated for airway protection. Based on OR report, there is residual thrombosis of femoral to popliteal bypass graft and all tibial vessels Post-intubation, patient became bradycardic with HR of 39 and hypotensive with systolic BP in the 31D. One dose of atropine 0.'5mg'$  given with improvement in HR. He is now in [place] with a rate of 102-106. His blood pressure has improved with fluid boluses and pressors. His current MAP is 74. He is hypothermic with a temp of 35.6.    SUBJECTIVE:  Doing well this morning s/p L eft AKA. Awake and following commands. Blood pressure is stable and not on pressors. Remains in Afib with short runs of V-tach; on amio gtte since 02/16. Febrile post-op; cultures sent.    VITAL SIGNS: BP 127/73 mmHg  Pulse 96  Temp(Src) 100.5 F (38.1 C) (Oral)  Resp 21  Ht '6\' 4"'$  (1.93 m)  Wt 162 lb 11.2 oz (73.8 kg)  BMI 19.81 kg/m2  SpO2 100%  HEMODYNAMICS:    VENTILATOR SETTINGS: Vent Mode:  [-] PRVC FiO2 (%):  [30 %] 30 % Set Rate:  [13 bmp] 13 bmp Vt Set:  [500 mL] 500 mL PEEP:  [5 cmH20] 5 cmH20 Plateau Pressure:  [15 cmH20-18 cmH20] 18 cmH20  INTAKE / OUTPUT: I/O last 3 completed shifts: In: 5642.8 [I.V.:4942.8; NG/GT:350; IV Piggyback:350] Out: 1761 [Urine:3835]  PHYSICAL EXAMINATION: General: Frail looking male,on vent intubated Neuro: Moves upper extremities to command HEENT: PERRLA, oral mucosa pink Cardiovascular: Afib, no MRG Lungs:  bilateral airflow, scattered rhonchi in all lung fields Abdomen: Soft, distended; hypoactive bowl sounds Musculoskeletal:  Bilateral AKA; left stump with dressing Extremities: +femoral pulse Skin: Warm; no rash  LABS:  BMET  Recent Labs Lab 12/11/15 2246 12/12/15 0501 12/13/15 0510  NA 143 144 145  K 3.6 4.0 3.8  CL 110 112* 111  CO2 '23 27 28  '$ BUN 31* 31* 34*  CREATININE 1.93* 1.76* 1.74*  GLUCOSE 264* 228* 298*    Electrolytes  Recent Labs Lab 12/10/15 2331  12/11/15 2246 12/12/15 0501 12/13/15 0510  CALCIUM 7.7*  < > 7.5* 7.4* 7.3*  MG 2.0  --  2.0 2.0  --   PHOS 2.8  --   --  2.4* 1.7*  < > = values in this interval not displayed.  CBC  Recent Labs Lab 12/12/15 1050 12/12/15 1717 12/13/15 0510  WBC 21.0* 17.7* 17.6*  HGB 8.0* 7.5* 7.1*  HCT 25.1* 23.4* 22.8*  PLT 143* 148* 174    Coag's No results for input(s): APTT, INR in the last 168 hours.  Sepsis Markers  Recent Labs Lab 12/06/15 1830  12/07/15 0304 12/07/15 0623 12/07/15 1418  LATICACIDVEN  --   < > 3.2* 4.8* 3.0*  PROCALCITON 0.54  --   --   --   --   < > = values in this interval not displayed.  ABG  Recent Labs Lab 12/06/15 1935 12/06/15 2316 12/07/15 0854  PHART 7.52* 7.43 7.49*  PCO2ART  26* 39 36  PO2ART 168* 173* 179*    Liver Enzymes  Recent Labs Lab 12/09/15 0427  ALBUMIN 1.9*    Cardiac Enzymes  Recent Labs Lab 12/06/15 2007 12/07/15 0622 12/07/15 0658  TROPONINI 22.40* 62.60* 57.81*    Glucose  Recent Labs Lab 12/12/15 1625 12/12/15 1752 12/12/15 1808 12/12/15 2159 12/13/15 0157 12/13/15 0559  GLUCAP 175* 180* 190* 228* 279* 255*    Imaging Dg Chest Port 1 View  12/13/2015  CLINICAL DATA:  70 year old male with acute respiratory failure EXAM: PORTABLE CHEST 1 VIEW COMPARISON:  Prior chest x-ray 12/07/2015 FINDINGS: The tip of the endotracheal tube is 2.5 cm above the carina. A right IJ central venous catheter projects over the superior cavoatrial junction. A gastric tube is present. The tip lies below the diaphragm, presumably within the stomach. External defibrillator pads project over the left chest. Stable cardiomegaly. Patient is status post median sternotomy with evidence of prior CABG including LIMA bypass. Surgical changes suggest prior surgery in both upper lungs as well. Low inspiratory volumes with bibasilar atelectasis. No overt pulmonary edema, pneumothorax or focal airspace consolidation. IMPRESSION: 1. Low inspiratory volumes with bibasilar atelectasis. 2. Stable cardiomegaly. 3. Stable and satisfactory support apparatus. Electronically Signed   By: Jacqulynn Cadet M.D.   On: 12/13/2015 08:01   STUDIES:  02/12: 2-D EUMP>53-61%; grade 1 diastolic dysfunction, LVH  CULTURES: 02/11>Blood>NTD 02/11>Sputum>STAPHYLOCOCCUS AUREUS  02/18 Blood x2>> 02/18 Urine>> 02/18 Respiratory>>  ANTIBIOTICS: Piperacillin-tazobactam 02/11>  SIGNIFICANT EVENTS: 02/07>ED with SOB and left calf pain>treated and released 02/09>ED with cold left foot and calf pain>CT angio>complete occlusion of the left fem-pop bypass graft>admitted 02/11>OR for angiogram with thrombolysis, thrombectomy of left peroneal artery, tibioperoneal trunk, popliteal artery,  and femoral to popliteal bypass graft and percutaneous transluminal angioplasty of the left peroneal artery and distal bypass anastomosis and popliteal artery Post-op: residual thrombosis of femoral to popliteal bypass graft and all tibial vessels; acute respiratory failure>Intubated   LINES/TUBES: Right IJ tlc 02/10> ETT 02/11> L Fem vascath 2/13>>  DISCUSSION: 70 YO male with acute complete occlusion of the  left fem-pop bypass graft s/p thrombolysis/thrombectomy/percuteneous transluminal angioplasty with residual thrombosis, sepsis secondary to possible necrosis from left leg thrombosis, and  acute hypoxic respiratory failure secondary to sepsis and complicated by acute NSTEMI and worsening renal function-Now improved without need for CRRT. OR 02/18 for left AKA ; fever and hypotension post-op; now improved and extubated.   ASSESSMENT / PLAN:  PULMONARY A: Acute hypoxic respiratory failure Possible aspiration pneumonia-Copious amount of secretions noted in airway during intubation-now with improvement; respiratory cultures positive for staph aureus  Severe metabolic acidosis - resolved H/O Lung cancer s/p right lung lobectomy P:   -Extubated 02/18 -Supplemental O2 to keep SPO2>88% -Oral hygiene -CXR in am -Albuterol and Duoneb prn  -Abx as above -F/U cultures - respiratory now with staph, currently being treated.  CARDIOVASCULAR-NSTEMI A:  Bradycardia-Resolved; Now in ST Septic shock H/O hypertension NSTEMI Afib Short runs of V-tach-amino and metoprolol started 2/17 P:  -Neo-synephrine  gtt and titrate to MAP goal >65 -Hemodynamics per ICU protocol -Continue metoprolol for afib -cont with amio gtt -Monitor and replace electrolytes.  -Cardiology following - rec low dose ASA -off anticoagulation 2/2 surgery  RENAL A:   AKI secondary procedural IV dye and volume depletion; baseline creatinine 0.83 P:   -LR at 100 ml/hr.  -Trend creatinine - currently  improving -Renally dose meds -nephro following - vascath placed, no dialysis at this time due to increase UOP and improving renal fcn.   GASTROINTESTINAL A:   Ileus P:   -PPI for GI prophylaxis -Monitor BMs -Hold TF -Lactulose 30 gram NGT x1 -Change senna to bid scheduled   HEMATOLOGIC A:   Left leg ischemia-Complete Occlusion of the left fem-pop bypass graft s/p thrombolysis/PTA with residual thrombosis Right neck hematoma s/p TLC placement Severe PVD s/p Right AKA Anemia P:  -Vascular surgery following - plan for possible surgical intervention of LLE today -Hg trending down; f/u repeat Hg -Monitor for bleeding-transfuse as needed,  INFECTIOUS A:   Septic shock-likely source is left leg ischemia/necrosis from massive thrombus-Resolving P:   -Antibiotics as above -F/U cultures -IV fluids and pressors to keep MAP>65  ENDOCRINE A:   Type 2 diabetes mellitus   P:   -Blood glucose monitoring with SSI coverage -Hold oral hypoglycemic agents  NEUROLOGIC A:   Acute metabolic encephalopathy H/o 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 H/O Seizures-Not on home seizure meds  Acute post-op pain P:   RASS goal: -2 -PRN fentanyl for pain -Monitor for seizure activity -Aspiration precautions   Family update: 02/18-Spoke with daughter Stephen Camacho and updated he that patient is extubated and has an ileus. TFs on hold and will repeat imaging in the morning. All questions answered and support provided  CCM time is 45 minutes  Magdalene S. Tukov ANP-BC Pulmonary and [place] Pager [phone]   I was present for the history, physical exam, assessment and plan as performed by the nurse practitioner.  Marda Stalker, M.D.   Critical Care Attestation.  I have personally obtained a history, examined the patient, evaluated laboratory and imaging results,  formulated the assessment and plan and placed orders. The Patient requires high complexity decision making for assessment and support, frequent evaluation and titration of therapies, application of advanced monitoring technologies and extensive interpretation of multiple databases. The patient has critical illness that could lead imminently to failure of 1 or more organ systems and requires the highest level of physician preparedness to intervene.  Critical Care Time devoted to patient care services described in this note is 35 minutes and is exclusive of time spent in procedures.

## 2015-12-13 NOTE — Progress Notes (Signed)
Pt hgb 6.8, pt rr in 30's, elink notified. elink rn or dr to call back after pt info assessed.

## 2015-12-13 NOTE — Progress Notes (Signed)
Subjective  - POD #1, s/p L AKA  Remains confused Appears comfortable  Physical Exam:  Dressing dry       Assessment/Plan:  POD #1  Monitor renal function, renal following Start SQ heparin PT/OT Stephen Camacho, [place] 12/13/2015 12:58 PM --  Filed Vitals:   12/13/15 1000 12/13/15 1100  BP: 148/84 138/80  Pulse: 107 103  Temp:    Resp: 18 27    Intake/Output Summary (Last 24 hours) at 12/13/15 1258 Last data filed at 12/13/15 1100  Gross per 24 hour  Intake 4467.93 ml  Output   2560 ml  Net 1907.93 ml     Laboratory CBC    Component Value Date/Time   WBC 17.6* 12/13/2015 0510   WBC 4.9 10/25/2014 1331   HGB 7.1* 12/13/2015 0510   HGB 12.1* 10/25/2014 1331   HCT 22.8* 12/13/2015 0510   HCT 38.1* 10/25/2014 1331   PLT 174 12/13/2015 0510   PLT 151 10/25/2014 1331    BMET    Component Value Date/Time   NA 145 12/13/2015 0510   NA 138 10/25/2014 1331   K 3.8 12/13/2015 0510   K 3.9 10/25/2014 1331   CL 111 12/13/2015 0510   CL 109* 10/25/2014 1331   CO2 28 12/13/2015 0510   CO2 21 10/25/2014 1331   GLUCOSE 298* 12/13/2015 0510   GLUCOSE 232* 10/25/2014 1331   BUN 34* 12/13/2015 0510   BUN 17 10/25/2014 1331   CREATININE 1.74* 12/13/2015 0510   CREATININE 1.11 10/25/2014 1331   CALCIUM 7.3* 12/13/2015 0510   CALCIUM 8.7 10/25/2014 1331   GFRNONAA 38* 12/13/2015 0510   GFRNONAA >60 10/25/2014 1331   GFRNONAA 52* 07/08/2014 1051   GFRAA 44* 12/13/2015 0510   GFRAA >60 10/25/2014 1331   GFRAA >60 07/08/2014 1051    COAG Lab Results  Component Value Date   INR 1.99 12/05/2015   INR 1.2 07/08/2014   INR 1.3 06/11/2014   No results found for: PTT  Antibiotics Anti-infectives    Start     Dose/Rate Route Frequency Ordered Stop   12/12/15 0600  ceFAZolin (ANCEF) IVPB 2 g/50 mL premix     2 g 100 mL/hr over 30 Minutes Intravenous On call to O.R. 12/11/15 1750 12/13/15 0559   12/10/15 0900  vancomycin (VANCOCIN) IVPB 750 mg/150 ml premix   Status:  Discontinued     750 mg 150 mL/hr over 60 Minutes Intravenous Every 36 hours 12/09/15 2301 12/10/15 1849   12/10/15 0600  vancomycin (VANCOCIN) IVPB 1000 mg/200 mL premix  Status:  Discontinued     1,000 mg 200 mL/hr over 60 Minutes Intravenous Every 24 hours 12/10/15 1849 12/11/15 1413   12/08/15 1800  vancomycin (VANCOCIN) IVPB 750 mg/150 ml premix  Status:  Discontinued     750 mg 150 mL/hr over 60 Minutes Intravenous Every 36 hours 12/07/15 0755 12/09/15 2301   12/07/15 0600  vancomycin (VANCOCIN) IVPB 750 mg/150 ml premix  Status:  Discontinued     750 mg 150 mL/hr over 60 Minutes Intravenous Every 18 hours 12/06/15 2020 12/07/15 0755   12/06/15 1930  vancomycin (VANCOCIN) IVPB 1000 mg/200 mL premix     1,000 mg 200 mL/hr over 60 Minutes Intravenous  Once 12/06/15 1854 12/06/15 2114   12/06/15 1515  piperacillin-tazobactam (ZOSYN) IVPB 3.375 g     3.375 g 12.5 mL/hr over 240 Minutes Intravenous 3 times per day 12/06/15 1502 12/15/15 1115   12/05/15 1445  [MAR Hold]  ceFAZolin (  ANCEF) IVPB 1 g/50 mL premix  Status:  Discontinued     (MAR Hold since 12/06/15 0910)  Comments:  Send with pt to OR   1 g 100 mL/hr over 30 Minutes Intravenous 3 times per day 12/05/15 1442 12/06/15 1104   12/05/15 1245  cefUROXime (ZINACEF) 1.5 g in dextrose 5 % 50 mL IVPB     1.5 g 100 mL/hr over 30 Minutes Intravenous  Once 12/05/15 1234 12/05/15 1305       V. Leia Alf, M.D. [facility] Office: [phone] Pager:  [phone]

## 2015-12-13 NOTE — Progress Notes (Addendum)
[place] Progress Note Patient Name: Jaymie Misch DOB: 05/30/1946 MRN: [id number]   Date of Service  12/13/2015  HPI/Events of Note  Multiple issues: 1. Change in mental status, 2. Increased RR into the 30's and 3. Hgb = 6.8. Patient received Morphine at about 5 PM. CXR this AM c/w low lung volumes and atelectasis. His ALOC is likely d/t residual anesthesia, opiod pain control, dementia and advanced age.   eICU Interventions  Will order: 1. Narcan 1 amp IV now. 2. D/C Morphine. 3. Transfuse 1 unit PRBC's when available.  4. Portable CXR STAT. 5. ABG now.  6. Turn, cough and incentive spirometry Q 2 hours.      Intervention Category Major Interventions: Change in mental status - evaluation and management Intermediate Interventions: Other:  Lysle Dingwall 12/13/2015, 6:32 PM

## 2015-12-13 NOTE — Progress Notes (Signed)
RT to room to extubate patient per Ledell Noss, NP.  Patient suctioned prior to extubation, then extubated to room air without complication.  Will continue to monitor.

## 2015-12-13 NOTE — Care Management (Signed)
RN working with patient at this time, RT prior to shift attempted ABG, Will attempt to obtain ABG after RN's are done with patient.

## 2015-12-13 NOTE — Progress Notes (Signed)
[facility]  ROUNDING NOTE   Subjective:   Left AKA by Dr. Delana Meyer  Tmax 101.4 hgb 7.1 (7.5)  Creatinine 1.74  LR at 100  Objective:  Vital signs in last 24 hours:  Temp:  [98.1 F (36.7 C)-101.4 F (38.6 C)] 100.5 F (38.1 C) (02/18 0800) Pulse Rate:  [82-115] 96 (02/18 0800) Resp:  [16-25] 21 (02/18 0800) BP: (108-136)/(64-83) 127/73 mmHg (02/18 0800) SpO2:  [96 %-100 %] 100 % (02/18 0800) FiO2 (%):  [30 %] 30 % (02/18 0828) Weight:  [73.8 kg (162 lb 11.2 oz)] 73.8 kg (162 lb 11.2 oz) (02/18 0422)  Weight change: -4.4 kg (-9 lb 11.2 oz) Filed Weights   12/11/15 0556 12/12/15 0609 12/13/15 0422  Weight: 80 kg (176 lb 5.9 oz) 78.2 kg (172 lb 6.4 oz) 73.8 kg (162 lb 11.2 oz)    Intake/Output: I/O last 3 completed shifts: In: 5642.8 [I.V.:4942.8; NG/GT:350; IV Piggyback:350] Out: 1448 [Urine:3835]   Intake/Output this shift:  Total I/O In: 390.1 [I.V.:315.1; NG/GT:75] Out: 125 [Urine:125]  Physical Exam: General: Critically ill  Head: Eyes closed, ETT, NGT  Eyes: Eyes closed  Neck: Right central line IJ  Lungs:  Bilateral diminished breath sounds, PRVC 30%  Heart: Sinus to sinus tach  Abdomen:  Soft, nontender  Extremities:  Right AKA, left AKA  Neurologic: Intubated and sedated  Skin: No lesions  Access: Right femoral vascath 12/08/15 Dr. Stevenson Clinch    Basic Metabolic Panel:  Recent Labs Lab 12/08/15 0533  12/09/15 1856 12/10/15 3149 12/10/15 2331 12/11/15 0508 12/11/15 2246 12/12/15 0501 12/13/15 0510  NA 140  < > 140 143 142 142 143 144 145  K 4.0  < > 4.1 4.0 4.0 3.8 3.6 4.0 3.8  CL 105  < > 105 107 109 110 110 112* 111  CO2 29  < > '31 30 28 29 23 27 28  '$ GLUCOSE 181*  < > 180* 188* 167* 234* 264* 228* 298*  BUN 40*  < > 38* 39* 36* 35* 31* 31* 34*  CREATININE 3.07*  < > 2.66* 2.19* 1.99* 2.06* 1.93* 1.76* 1.74*  CALCIUM 7.0*  < > 7.1* 7.7* 7.7* 7.5* 7.5* 7.4* 7.3*  MG 1.6*  --   --  2.0 2.0  --  2.0 2.0  --   PHOS 4.7*  --  4.3   --  2.8  --   --  2.4* 1.7*  < > = values in this interval not displayed.  Liver Function Tests:  Recent Labs Lab 12/09/15 0427  ALBUMIN 1.9*   No results for input(s): LIPASE, AMYLASE in the last 168 hours. No results for input(s): AMMONIA in the last 168 hours.  CBC:  Recent Labs Lab 12/09/15 2034 12/10/15 0816 12/12/15 1050 12/12/15 1717 12/13/15 0510  WBC 18.5* 18.6* 21.0* 17.7* 17.6*  NEUTROABS 16.2* 16.6*  --   --   --   HGB 8.2* 8.7* 8.0* 7.5* 7.1*  HCT 25.0* 26.6* 25.1* 23.4* 22.8*  MCV 84.1 84.5 86.1 85.4 87.3  PLT 86* 105* 143* 148* 174    Cardiac Enzymes:  Recent Labs Lab 12/06/15 1647 12/06/15 2007 12/07/15 0622 12/07/15 0658 12/07/15 1418  CKTOTAL  --   --   --   --  104  TROPONINI 2.41* 22.40* 62.60* 57.81*  --     BNP: Invalid input(s): POCBNP  CBG:  Recent Labs Lab 12/12/15 1752 12/12/15 1808 12/12/15 2159 12/13/15 0157 12/13/15 0559  GLUCAP 180* 190* 228* 279* 255*  Microbiology: Results for orders placed or performed during the hospital encounter of 12/05/15  Culture, respiratory (NON-Expectorated)     Status: None   Collection Time: 12/06/15  4:05 PM  Result Value Ref Range Status   Specimen Description NASAL SWAB  Final   Special Requests NONE  Final   Gram Stain RARE WBC SEEN FEW GRAM POSITIVE COCCI   Final   Culture HEAVY GROWTH STAPHYLOCOCCUS AUREUS  Final   Report Status 12/09/2015 FINAL  Final   Organism ID, Bacteria STAPHYLOCOCCUS AUREUS  Final      Susceptibility   Staphylococcus aureus - MIC*    CIPROFLOXACIN 2 INTERMEDIATE Intermediate     GENTAMICIN <=0.5 SENSITIVE Sensitive     OXACILLIN <=0.25 SENSITIVE Sensitive     TETRACYCLINE <=1 SENSITIVE Sensitive     VANCOMYCIN 1 SENSITIVE Sensitive     TRIMETH/SULFA <=10 SENSITIVE Sensitive     RIFAMPIN <=0.5 SENSITIVE Sensitive     Inducible Clindamycin NEGATIVE Sensitive     * HEAVY GROWTH STAPHYLOCOCCUS AUREUS  Culture, blood (Routine X 2) w Reflex to ID  Panel     Status: None   Collection Time: 12/07/15 12:09 AM  Result Value Ref Range Status   Specimen Description BLOOD BLOOD RIGHT HAND  Final   Special Requests BOTTLES DRAWN AEROBIC AND ANAEROBIC 10CC  Final   Culture NO GROWTH 6 DAYS  Final   Report Status 12/13/2015 FINAL  Final  Culture, blood (Routine X 2) w Reflex to ID Panel     Status: None   Collection Time: 12/07/15 12:09 AM  Result Value Ref Range Status   Specimen Description BLOOD BLOOD LEFT HAND  Final   Special Requests BOTTLES DRAWN AEROBIC AND ANAEROBIC 10CC  Final   Culture NO GROWTH 6 DAYS  Final   Report Status 12/13/2015 FINAL  Final  Urine culture     Status: None   Collection Time: 12/07/15 12:11 AM  Result Value Ref Range Status   Specimen Description URINE, CLEAN CATCH  Final   Special Requests NONE  Final   Culture NO GROWTH 1 DAY  Final   Report Status 12/08/2015 FINAL  Final  MRSA PCR Screening     Status: None   Collection Time: 12/08/15  9:12 AM  Result Value Ref Range Status   MRSA by PCR NEGATIVE NEGATIVE Final    Comment:        The GeneXpert MRSA Assay (FDA approved for NASAL specimens only), is one component of a comprehensive MRSA colonization surveillance program. It is not intended to diagnose MRSA infection nor to guide or monitor treatment for MRSA infections.   C difficile quick scan w PCR reflex     Status: None   Collection Time: 12/11/15  1:54 PM  Result Value Ref Range Status   C Diff antigen NEGATIVE NEGATIVE Final   C Diff toxin NEGATIVE NEGATIVE Final   C Diff interpretation Negative for C. difficile  Final    Coagulation Studies: No results for input(s): LABPROT, INR in the last 72 hours.  Urinalysis: No results for input(s): COLORURINE, LABSPEC, PHURINE, GLUCOSEU, HGBUR, BILIRUBINUR, KETONESUR, PROTEINUR, UROBILINOGEN, NITRITE, LEUKOCYTESUR in the last 72 hours.  Invalid input(s): APPERANCEUR    Imaging: Dg Chest Port 1 View  12/13/2015  CLINICAL DATA:   70 year old male with acute respiratory failure EXAM: PORTABLE CHEST 1 VIEW COMPARISON:  Prior chest x-ray 12/07/2015 FINDINGS: The tip of the endotracheal tube is 2.5 cm above the carina. A right IJ central venous catheter  projects over the superior cavoatrial junction. A gastric tube is present. The tip lies below the diaphragm, presumably within the stomach. External defibrillator pads project over the left chest. Stable cardiomegaly. Patient is status post median sternotomy with evidence of prior CABG including LIMA bypass. Surgical changes suggest prior surgery in both upper lungs as well. Low inspiratory volumes with bibasilar atelectasis. No overt pulmonary edema, pneumothorax or focal airspace consolidation. IMPRESSION: 1. Low inspiratory volumes with bibasilar atelectasis. 2. Stable cardiomegaly. 3. Stable and satisfactory support apparatus. Electronically Signed   By: Jacqulynn Cadet M.D.   On: 12/13/2015 08:01     Medications:   . amiodarone 30 mg/hr (12/13/15 0828)  . feeding supplement (VITAL AF 1.2 CAL) 1,000 mL (12/13/15 0700)  . fentaNYL infusion INTRAVENOUS 300 mcg/hr (12/13/15 0600)  . lactated ringers 100 mL/hr at 12/13/15 0813  . nitroGLYCERIN    . norepinephrine (LEVOPHED) Adult infusion Stopped (12/12/15 1850)  . phenylephrine (NEO-SYNEPHRINE) Adult infusion Stopped (12/11/15 2300)   . amiodarone  150 mg Intravenous Once  . antiseptic oral rinse  7 mL Mouth Rinse QID  . bisacodyl  10 mg Rectal Once  . chlorhexidine gluconate  15 mL Mouth Rinse BID  . feeding supplement (PRO-STAT SUGAR FREE 64)  30 mL Per Tube QID  . free water  30 mL Per Tube 6 times per day  . insulin aspart  0-15 Units Subcutaneous Q4H  . insulin regular  8 Units Subcutaneous Once  . ipratropium-albuterol  3 mL Nebulization Q6H  . metoprolol tartrate  50 mg Per Tube BID  . pantoprazole sodium  40 mg Per Tube Daily  . piperacillin-tazobactam (ZOSYN)  IV  3.375 g Intravenous 3 times per day  . sodium  chloride  500 mL Intravenous Once   acetaminophen **OR** [DISCONTINUED] acetaminophen, albuterol, bisacodyl, fentaNYL (SUBLIMAZE) injection, fentaNYL (SUBLIMAZE) injection, fentaNYL (SUBLIMAZE) injection, heparin, hydrALAZINE, labetalol, midazolam, ondansetron **OR** ondansetron (ZOFRAN) IV, ondansetron (ZOFRAN) IV, sennosides  Assessment/ Plan:  Stephen Camacho is a 70 y.o. black  male with hypertension, hyperlipidemia, diabetes mellitus type II, coronary artery disease status post 2 vessel CABG, peripheral arterial disease status post right above-the-knee amputation, seizure disorder, lung cancer, prostate cancer, history of CVA, who was admitted to [facility] on 12/05/2015 for ischemic left lower extremity. Hospital course complicated by acute renal failure, acute respiratory failure, new CVAs, myocardial infarction.  1. Acute renal failure:  ATN from contrast exposure. nonoliguric urine output. renal function stable to improving. Baseline creatinine of 0.83. Electrolytes currently at goal.  - No acute indication for dialysis at this time. Continue monitor closely for dialysis need. Nonoliguric urine output. Monitor volume status, respiratory status, urine output, electrolytes and kidney function - Renally dose all medications - IVF LR at 185m/hr  2. Acute respiratory failure: Status post intubation and on mechanical ventilation. With acute myocardial infarction and acute CVA - echocardiogram with diastolic failure.  - Appreciate cardiology and pulmonary input.   3. Ischemic left lower extremity: peripheral vascular disease: status post Left AKA on 2/17 Dr. SDelana Meyer- Appreciate vascular input.   4. Anemia with renal failure: PRBC transfusion 2/14. Hemoglobin dropping. Platelets improved.  - low threshold for another PRBC transfusion.    LOS: [place]2/18/20178:44 AM

## 2015-12-13 NOTE — Progress Notes (Signed)
Subjective: Pt awake but noncommunicative.  In NAD   Objective: Filed Vitals:   12/13/15 0800 12/13/15 0900 12/13/15 1000 12/13/15 1100  BP: 127/73 126/73 148/84 138/80  Pulse: 96 98 107 103  Temp: 100.5 F (38.1 C)     TempSrc: Oral     Resp: '21 21 18 27  '$ Height:      Weight:      SpO2: 100% 99% 100% 100%   Weight change: -4.4 kg (-9 lb 11.2 oz)  Intake/Output Summary (Last 24 hours) at 12/13/15 1222 Last data filed at 12/13/15 1100  Gross per 24 hour  Intake 4467.93 ml  Output   2560 ml  Net 1907.93 ml    General: Awake  Does not answer questions   Neck:  JVP is normal Heart: Regular rate and rhythm, without murmurs, rubs, gallops.  Lungs: Clear to auscultation.  No rales or wheezes. Exemities:  Bilateral amputation  Neuro: Grossly intact, nonfocal.  Tele:  SR    Lab Results: Results for orders placed or performed during the hospital encounter of 12/05/15 (from the past 24 hour(s))  Glucose, capillary     Status: Abnormal   Collection Time: 12/12/15  4:25 PM  Result Value Ref Range   Glucose-Capillary 175 (H) 65 - 99 mg/dL  CBC     Status: Abnormal   Collection Time: 12/12/15  5:17 PM  Result Value Ref Range   WBC 17.7 (H) 3.8 - 10.6 K/uL   RBC 2.74 (L) 4.40 - 5.90 MIL/uL   Hemoglobin 7.5 (L) 13.0 - 18.0 g/dL   HCT 23.4 (L) 40.0 - 52.0 %   MCV 85.4 80.0 - 100.0 fL   MCH 27.3 26.0 - 34.0 pg   MCHC 32.0 32.0 - 36.0 g/dL   RDW 15.8 (H) 11.5 - 14.5 %   Platelets 148 (L) 150 - 440 K/uL  Glucose, capillary     Status: Abnormal   Collection Time: 12/12/15  5:52 PM  Result Value Ref Range   Glucose-Capillary 180 (H) 65 - 99 mg/dL  Glucose, capillary     Status: Abnormal   Collection Time: 12/12/15  6:08 PM  Result Value Ref Range   Glucose-Capillary 190 (H) 65 - 99 mg/dL  Glucose, capillary     Status: Abnormal   Collection Time: 12/12/15  9:59 PM  Result Value Ref Range   Glucose-Capillary 228 (H) 65 - 99 mg/dL   Comment 1 Notify RN   Glucose, capillary      Status: Abnormal   Collection Time: 12/13/15  1:57 AM  Result Value Ref Range   Glucose-Capillary 279 (H) 65 - 99 mg/dL   Comment 1 Notify RN   CBC     Status: Abnormal   Collection Time: 12/13/15  5:10 AM  Result Value Ref Range   WBC 17.6 (H) 3.8 - 10.6 K/uL   RBC 2.61 (L) 4.40 - 5.90 MIL/uL   Hemoglobin 7.1 (L) 13.0 - 18.0 g/dL   HCT 22.8 (L) 40.0 - 52.0 %   MCV 87.3 80.0 - 100.0 fL   MCH 27.4 26.0 - 34.0 pg   MCHC 31.4 (L) 32.0 - 36.0 g/dL   RDW 16.1 (H) 11.5 - 14.5 %   Platelets 174 150 - 440 K/uL  Basic metabolic panel     Status: Abnormal   Collection Time: 12/13/15  5:10 AM  Result Value Ref Range   Sodium 145 135 - 145 mmol/L   Potassium 3.8 3.5 - 5.1 mmol/L   Chloride  111 101 - 111 mmol/L   CO2 28 22 - 32 mmol/L   Glucose, Bld 298 (H) 65 - 99 mg/dL   BUN 34 (H) 6 - 20 mg/dL   Creatinine, Ser 1.74 (H) 0.61 - 1.24 mg/dL   Calcium 7.3 (L) 8.9 - 10.3 mg/dL   GFR calc non Af Amer 38 (L) >60 mL/min   GFR calc Af Amer 44 (L) >60 mL/min   Anion gap 6 5 - 15  Phosphorus     Status: Abnormal   Collection Time: 12/13/15  5:10 AM  Result Value Ref Range   Phosphorus 1.7 (L) 2.5 - 4.6 mg/dL  Glucose, capillary     Status: Abnormal   Collection Time: 12/13/15  5:59 AM  Result Value Ref Range   Glucose-Capillary 255 (H) 65 - 99 mg/dL   Comment 1 Notify RN   Glucose, capillary     Status: Abnormal   Collection Time: 12/13/15  9:40 AM  Result Value Ref Range   Glucose-Capillary 246 (H) 65 - 99 mg/dL    Studies/Results: Dg Abd 1 View  12/13/2015  CLINICAL DATA:  70 year old male status post left above the knee amputation EXAM: ABDOMEN - 1 VIEW COMPARISON:  Prior abdominal radiograph 12/06/2015 FINDINGS: Gaseous distension of the colon. Small volume gas in the rectum. No definite small bowel dilatation. Right femoral approach hemodialysis catheter. The catheter tip overlies the lower IVC. No acute osseous abnormality. IMPRESSION: 1. Diffuse gaseous distension of the colon  suggests postoperative ileus. 2. Right femoral approach hemodialysis catheter with the tip overlying the lower SVC. Electronically Signed   By: Jacqulynn Cadet M.D.   On: 12/13/2015 09:43   Dg Chest Port 1 View  12/13/2015  CLINICAL DATA:  70 year old male with acute respiratory failure EXAM: PORTABLE CHEST 1 VIEW COMPARISON:  Prior chest x-ray 12/07/2015 FINDINGS: The tip of the endotracheal tube is 2.5 cm above the carina. A right IJ central venous catheter projects over the superior cavoatrial junction. A gastric tube is present. The tip lies below the diaphragm, presumably within the stomach. External defibrillator pads project over the left chest. Stable cardiomegaly. Patient is status post median sternotomy with evidence of prior CABG including LIMA bypass. Surgical changes suggest prior surgery in both upper lungs as well. Low inspiratory volumes with bibasilar atelectasis. No overt pulmonary edema, pneumothorax or focal airspace consolidation. IMPRESSION: 1. Low inspiratory volumes with bibasilar atelectasis. 2. Stable cardiomegaly. 3. Stable and satisfactory support apparatus. Electronically Signed   By: Jacqulynn Cadet M.D.   On: 12/13/2015 08:01    Medications:Reviewed    '@PROBHOSP'$ @  1  PVOD  Post Op from amputation Post op day 1   2  CAD Recent NSTEMI  Echo LVEF 55 to 60%  Peak trop >60   Hemodynamics currently stable  Remains on amio IV (started 2/16 for prolonged VT)  I would continue   Continue metoprolol  Recommend ASA 81 mg if OK from surgical and neuro standpoint.    3  CVA     LOS: 8 days   Dorris Carnes 12/13/2015, 12:22 PM

## 2015-12-13 NOTE — Progress Notes (Signed)
Pharmacy Antibiotic Note  Stephen Camacho is a 70 y.o. male admitted on 12/05/2015 with ischemic leg.  Pharmacy has been consulted for Zosyn dosing. Patient previously received vancomycin 2/11-2/16.   Plan:  Continue Zosyn 3.375 g IV q8h per EI protocol.  Height: '6\' 4"'$  (193 cm) Weight: 162 lb 11.2 oz (73.8 kg) IBW/kg (Calculated) : 86.8  Temp (24hrs), Avg:99.5 F (37.5 C), Min:98.1 F (36.7 C), Max:101.4 F (38.6 C)   Recent Labs Lab 12/06/15 1508 12/06/15 2007 12/07/15 0304  12/07/15 0623  12/07/15 1418  12/09/15 2034 12/10/15 0442 12/10/15 0816 12/10/15 2331 12/11/15 0508 12/11/15 2246 12/12/15 0501 12/12/15 1050 12/12/15 1717 12/13/15 0510  WBC  --  10.5  --   --   --   < >  --   < > 18.5*  --  18.6*  --   --   --   --  21.0* 17.7* 17.6*  CREATININE  --  2.69*  --   < >  --   --   --   < >  --  2.19*  --  1.99* 2.06* 1.93* 1.76*  --   --  1.74*  LATICACIDVEN 17.8* 7.5* 3.2*  --  4.8*  --  3.0*  --   --   --   --   --   --   --   --   --   --   --   VANCOTROUGH  --   --   --   --   --   --   --   --   --  9*  --   --   --   --   --   --   --   --   < > = values in this interval not displayed.  Estimated Creatinine Clearance: 41.2 mL/min (by C-G formula based on Cr of 1.74).    No Known Allergies  Antimicrobials this admission: Zosyn 2/11 >>  Vancomycin 2/11 >> 02/16   Microbiology results: Results for orders placed or performed during the hospital encounter of 12/05/15  Culture, respiratory (NON-Expectorated)     Status: None   Collection Time: 12/06/15  4:05 PM  Result Value Ref Range Status   Specimen Description NASAL SWAB  Final   Special Requests NONE  Final   Gram Stain RARE WBC SEEN FEW GRAM POSITIVE COCCI   Final   Culture HEAVY GROWTH STAPHYLOCOCCUS AUREUS  Final   Report Status 12/09/2015 FINAL  Final   Organism ID, Bacteria STAPHYLOCOCCUS AUREUS  Final      Susceptibility   Staphylococcus aureus - MIC*    CIPROFLOXACIN 2 INTERMEDIATE  Intermediate     GENTAMICIN <=0.5 SENSITIVE Sensitive     OXACILLIN <=0.25 SENSITIVE Sensitive     TETRACYCLINE <=1 SENSITIVE Sensitive     VANCOMYCIN 1 SENSITIVE Sensitive     TRIMETH/SULFA <=10 SENSITIVE Sensitive     RIFAMPIN <=0.5 SENSITIVE Sensitive     Inducible Clindamycin NEGATIVE Sensitive     * HEAVY GROWTH STAPHYLOCOCCUS AUREUS  Culture, blood (Routine X 2) w Reflex to ID Panel     Status: None   Collection Time: 12/07/15 12:09 AM  Result Value Ref Range Status   Specimen Description BLOOD BLOOD RIGHT HAND  Final   Special Requests BOTTLES DRAWN AEROBIC AND ANAEROBIC 10CC  Final   Culture NO GROWTH 6 DAYS  Final   Report Status 12/13/2015 FINAL  Final  Culture, blood (Routine X 2) w Reflex to ID  Panel     Status: None   Collection Time: 12/07/15 12:09 AM  Result Value Ref Range Status   Specimen Description BLOOD BLOOD LEFT HAND  Final   Special Requests BOTTLES DRAWN AEROBIC AND ANAEROBIC 10CC  Final   Culture NO GROWTH 6 DAYS  Final   Report Status 12/13/2015 FINAL  Final  Urine culture     Status: None   Collection Time: 12/07/15 12:11 AM  Result Value Ref Range Status   Specimen Description URINE, CLEAN CATCH  Final   Special Requests NONE  Final   Culture NO GROWTH 1 DAY  Final   Report Status 12/08/2015 FINAL  Final  MRSA PCR Screening     Status: None   Collection Time: 12/08/15  9:12 AM  Result Value Ref Range Status   MRSA by PCR NEGATIVE NEGATIVE Final    Comment:        The GeneXpert MRSA Assay (FDA approved for NASAL specimens only), is one component of a comprehensive MRSA colonization surveillance program. It is not intended to diagnose MRSA infection nor to guide or monitor treatment for MRSA infections.   C difficile quick scan w PCR reflex     Status: None   Collection Time: 12/11/15  1:54 PM  Result Value Ref Range Status   C Diff antigen NEGATIVE NEGATIVE Final   C Diff toxin NEGATIVE NEGATIVE Final   C Diff interpretation Negative for  C. difficile  Final    Pharmacy will continue to monitor and adjust per consult.  Olivia Canter, [facility] Clinical Pharmacist  12/13/2015

## 2015-12-13 NOTE — Progress Notes (Signed)
Nutrition Follow-up    INTERVENTION:   EN: re-estimated needs post amputation, recommend contining current TF regimen. Phosphorus low, discussed with Dorene Sorrow NP, plans to supplement.    NUTRITION DIAGNOSIS:   Inadequate oral intake related to inability to eat as evidenced by NPO status.Being addressed via TF  GOAL:   Patient will meet greater than or equal to 90% of their needs  MONITOR:    (Energy Intake, Electrolyte and renal Profile, Anthropometrics, Digestive System, Glucose Profile, Pulmonary Profile)  REASON FOR ASSESSMENT:   Ventilator, Consult Enteral/tube feeding initiation and management  ASSESSMENT:   Pt remains on vent, s/p left AKA yestserday  Diet Order:  Diet NPO time specified  Digestive System: no signs of TF intolerance, +small BM this AM,abdomen taut, BS active, noted abdominal xray pending for this AM   Recent Labs Lab 12/10/15 2331  12/11/15 2246 12/12/15 0501 12/13/15 0510  NA 142  < > 143 144 145  K 4.0  < > 3.6 4.0 3.8  CL 109  < > 110 112* 111  CO2 28  < > '23 27 28  '$ BUN 36*  < > 31* 31* 34*  CREATININE 1.99*  < > 1.93* 1.76* 1.74*  CALCIUM 7.7*  < > 7.5* 7.4* 7.3*  MG 2.0  --  2.0 2.0  --   PHOS 2.8  --   --  2.4* 1.7*  GLUCOSE 167*  < > 264* 228* 298*  < > = values in this interval not displayed.  Glucose Profile:  Recent Labs  12/13/15 0157 12/13/15 0559 12/13/15 0940  GLUCAP 279* 255* 246*   Meds: LR at 100 ml/hr, ss novolog  Height: pre-amputation height is 6 feet 4 inches per chart; pt now bilateral LE amputee; measure length around 44 inches  Ht Readings from Last 1 Encounters:  12/05/15 '6\' 4"'$  (1.93 m)    Weight: weight down as expected post amputation yesterday  Wt Readings from Last 1 Encounters:  12/13/15 162 lb 11.2 oz (73.8 kg)    Filed Weights   12/11/15 0556 12/12/15 0609 12/13/15 0422  Weight: 176 lb 5.9 oz (80 kg) 172 lb 6.4 oz (78.2 kg) 162 lb 11.2 oz (73.8 kg)    BMI:  Body mass index is  19.81 kg/(m^2)., 91% IBW with amputations taken into account based on documented pre-amputation height  Estimated Nutritional Needs:   Kcal:  1738kcals, using [employer] Equation with ABW of 65kg, (BEE: 1506kcals, Temp: 37.7, Ve: 6.5)  Protein:  136-182g protein (1.5-2.0g/kg) using IBW of 91kg  Fluid:  2275-2766m of fluid (25-318mkg) using IBW of 91kg  HIGH Care Level  CaBorgWarnerS, RD, LDN [phone]  ([phone]/On-Call Pager

## 2015-12-13 NOTE — Progress Notes (Signed)
[facility] Progress Note Patient Name: Naphtali Zywicki DOB: 18-Dec-1945 MRN: [id number]   Date of Service  12/13/2015  HPI/Events of Note  Troponin = 2.03 (was in 50's on 12/07/2015).   eICU Interventions  Will order: 1. ASA Suppository 150 mg PR now and Q day.  2. 12 Lead EKG now. 3. Continue to trend Troponin.     Intervention Category Intermediate Interventions: Diagnostic test evaluation  Lysle Dingwall 12/13/2015, 9:21 PM

## 2015-12-13 NOTE — Progress Notes (Signed)
[facility] Progress Note Patient Name: Stephen Camacho DOB: 01/28/1946 MRN: [id number]   Date of Service  12/13/2015  HPI/Events of Note  Multiple issues: 1. Not able to take Metoprolol PO and 2. EKG done. EKG reveals sinus tachycardia. Possible Left atrial enlargement. Rightward axis. Anteroseptal infarct , age undetermined ST & T wave abnormality, consider inferolateral ischemia. Patient is already on Metoprolol PO and ASA.  eICU Interventions  Will change Metoprolol PO to IV.     Intervention Category Intermediate Interventions: Diagnostic test evaluation Minor Interventions: Routine modifications to care plan (e.g. PRN medications for pain, fever)  Stephen Camacho 12/13/2015, 10:17 PM

## 2015-12-14 ENCOUNTER — Inpatient Hospital Stay: Payer: PPO

## 2015-12-14 LAB — MAGNESIUM: Magnesium: 1.9 mg/dL (ref 1.7–2.4)

## 2015-12-14 LAB — CBC
HEMATOCRIT: 24.8 % — AB (ref 40.0–52.0)
HEMOGLOBIN: 8.2 g/dL — AB (ref 13.0–18.0)
MCH: 28.6 pg (ref 26.0–34.0)
MCHC: 33 g/dL (ref 32.0–36.0)
MCV: 86.6 fL (ref 80.0–100.0)
Platelets: 188 10*3/uL (ref 150–440)
RBC: 2.86 MIL/uL — ABNORMAL LOW (ref 4.40–5.90)
RDW: 15.3 % — AB (ref 11.5–14.5)
WBC: 17.8 10*3/uL — ABNORMAL HIGH (ref 3.8–10.6)

## 2015-12-14 LAB — GLUCOSE, CAPILLARY
GLUCOSE-CAPILLARY: 156 mg/dL — AB (ref 65–99)
GLUCOSE-CAPILLARY: 166 mg/dL — AB (ref 65–99)
GLUCOSE-CAPILLARY: 174 mg/dL — AB (ref 65–99)
GLUCOSE-CAPILLARY: 185 mg/dL — AB (ref 65–99)
GLUCOSE-CAPILLARY: 190 mg/dL — AB (ref 65–99)
Glucose-Capillary: 229 mg/dL — ABNORMAL HIGH (ref 65–99)
Glucose-Capillary: 181 mg/dL — ABNORMAL HIGH (ref 65–99)
Glucose-Capillary: 171 mg/dL — ABNORMAL HIGH (ref 65–99)
Glucose-Capillary: 159 mg/dL — ABNORMAL HIGH (ref 65–99)

## 2015-12-14 LAB — URINE CULTURE: Culture: NO GROWTH

## 2015-12-14 LAB — BASIC METABOLIC PANEL
Anion gap: 10 (ref 5–15)
BUN: 36 mg/dL — AB (ref 6–20)
CALCIUM: 7.4 mg/dL — AB (ref 8.9–10.3)
CHLORIDE: 112 mmol/L — AB (ref 101–111)
CO2: 26 mmol/L (ref 22–32)
CREATININE: 2.02 mg/dL — AB (ref 0.61–1.24)
GFR calc non Af Amer: 32 mL/min — ABNORMAL LOW (ref >60)
GFR, EST AFRICAN AMERICAN: 37 mL/min — AB (ref >60)
Glucose, Bld: 248 mg/dL — ABNORMAL HIGH (ref 65–99)
Potassium: 3.3 mmol/L — ABNORMAL LOW (ref 3.5–5.1)
SODIUM: 148 mmol/L — AB (ref 135–145)

## 2015-12-14 LAB — TROPONIN I
TROPONIN I: 2.67 ng/mL — AB (ref <0.031)
TROPONIN I: 3 ng/mL — AB (ref <0.031)

## 2015-12-14 LAB — PHOSPHORUS: PHOSPHORUS: 2.5 mg/dL (ref 2.5–4.6)

## 2015-12-14 LAB — PREPARE RBC (CROSSMATCH)

## 2015-12-14 MED ORDER — POTASSIUM CHLORIDE 20 MEQ/15ML (10%) PO SOLN: 40.0000 meq | Freq: Once | ORAL | Status: DC

## 2015-12-14 MED ORDER — CETYLPYRIDINIUM CHLORIDE 0.05 % MT LIQD
7.0000 mL | Freq: Two times a day (BID) | OROMUCOSAL | Status: DC
  Administered 2015-12-14 – 2015-12-16 (×4): 7 mL via OROMUCOSAL

## 2015-12-14 MED ORDER — AMIODARONE HCL 200 MG PO TABS
200.0000 mg | ORAL_TABLET | Freq: Two times a day (BID) | ORAL | Status: DC
  Administered 2015-12-15 – 2015-12-19 (×8): 200 mg via ORAL
  Filled 2015-12-14 (×8): qty 1

## 2015-12-14 MED ORDER — FENTANYL CITRATE (PF) 100 MCG/2ML IJ SOLN
25.0000 ug | INTRAMUSCULAR | Status: DC | PRN
  Administered 2015-12-14 – 2015-12-17 (×3): 25 ug via INTRAVENOUS
  Filled 2015-12-14 (×3): qty 2

## 2015-12-14 MED ORDER — POLYETHYLENE GLYCOL 3350 17 G PO PACK: 17.0000 g | PACK | Freq: Every day | ORAL | Status: DC

## 2015-12-14 MED ORDER — AMIODARONE HCL 200 MG PO TABS
200.0000 mg | ORAL_TABLET | Freq: Every day | ORAL | Status: DC
  Administered 2015-12-14: 200 mg via ORAL
  Filled 2015-12-14: qty 1

## 2015-12-14 MED ORDER — SENNOSIDES-DOCUSATE SODIUM 8.6-50 MG PO TABS
1.0000 | ORAL_TABLET | Freq: Two times a day (BID) | ORAL | Status: DC
  Administered 2015-12-17: 1 via ORAL
  Filled 2015-12-14: qty 1

## 2015-12-14 MED ORDER — POTASSIUM CHLORIDE 10 MEQ/100ML IV SOLN
10.0000 meq | INTRAVENOUS | Status: AC
  Administered 2015-12-14 (×4): 10 meq via INTRAVENOUS
  Filled 2015-12-14 (×4): qty 100

## 2015-12-14 NOTE — Progress Notes (Signed)
Pt is resting comfortably on 2L nasal cannula.  Pt is confused.  Oriented only to self.  Lungs are diminished.  Foley remains in place with adequate urine output.  Pt has flexi seal in place.  Small amount of stool in tube.  Pt is now sinus rhythm on the cardiac monitor.  Pt had a hemoglobin of 6.8 and one unit of PRBC's was given.  Per Dr. Oletta Darter ok to give blood when pt noted to have slight fever. After blood, hemoglobin then went to 8.2.  Potassium replacement is being given for a potassium level of 3.3.

## 2015-12-14 NOTE — Progress Notes (Signed)
RN preformed bedside swallow eval. Pt sitting up at 45 degrees. Pt swallowed well with no coughing or gagging. Stephen Camacho E .1000 AM. 12/14/2015

## 2015-12-14 NOTE — Progress Notes (Signed)
[facility]  ROUNDING NOTE   Subjective:   Extubated. Confused. Hypoxic and tachypnic overnight.   Tmax 101.8  Creatinine 2.02 (1.74) LR at 100 Objective:  Vital signs in last 24 hours:  Temp:  [98.6 F (37 C)-101.8 F (38.8 C)] 99.2 F (37.3 C) (02/19 0700) Pulse Rate:  [87-107] 91 (02/19 0800) Resp:  [18-43] 26 (02/19 0800) BP: (122-148)/(72-91) 137/83 mmHg (02/19 0800) SpO2:  [93 %-100 %] 98 % (02/19 0800) FiO2 (%):  [30 %] 30 % (02/18 1134) Weight:  [71.85 kg (158 lb 6.4 oz)] 71.85 kg (158 lb 6.4 oz) (02/19 0300)  Weight change: -1.95 kg (-4 lb 4.8 oz) Filed Weights   12/12/15 0609 12/13/15 0422 12/14/15 0300  Weight: 78.2 kg (172 lb 6.4 oz) 73.8 kg (162 lb 11.2 oz) 71.85 kg (158 lb 6.4 oz)    Intake/Output: I/O last 3 completed shifts: In: 6028.2 [I.V.:4423.2; Blood:390; NG/GT:365; IV Piggyback:850] Out: 9470 [Urine:3860]   Intake/Output this shift:  Total I/O In: 116.7 [I.V.:116.7] Out: 200 [Urine:200]  Physical Exam: General: Critically ill  Head: PERRL, moist oral mucosal membranes  Eyes: Eyes closed  Neck: Right central line IJ  Lungs:  Bilateral diminished breath sounds  Heart: Sinus to sinus tach  Abdomen:  Soft, nontender  Extremities:  Right AKA, left AKA  Neurologic: Intubated and sedated  Skin: No lesions  Access: Right femoral vascath 12/08/15 Dr. Stevenson Clinch    Basic Metabolic Panel:  Recent Labs Lab 12/09/15 9628 12/10/15 3662 12/10/15 2331 12/11/15 9476 12/11/15 2246 12/12/15 0501 12/13/15 0510 12/14/15 0213  NA 140 143 142 142 143 144 145 148*  K 4.1 4.0 4.0 3.8 3.6 4.0 3.8 3.3*  CL 105 107 109 110 110 112* 111 112*  CO2 '31 30 28 29 23 27 28 26  '$ GLUCOSE 180* 188* 167* 234* 264* 228* 298* 248*  BUN 38* 39* 36* 35* 31* 31* 34* 36*  CREATININE 2.66* 2.19* 1.99* 2.06* 1.93* 1.76* 1.74* 2.02*  CALCIUM 7.1* 7.7* 7.7* 7.5* 7.5* 7.4* 7.3* 7.4*  MG  --  2.0 2.0  --  2.0 2.0  --  1.9  PHOS 4.3  --  2.8  --   --  2.4* 1.7*  2.5    Liver Function Tests:  Recent Labs Lab 12/09/15 0427  ALBUMIN 1.9*   No results for input(s): LIPASE, AMYLASE in the last 168 hours. No results for input(s): AMMONIA in the last 168 hours.  CBC:  Recent Labs Lab 12/09/15 2034 12/10/15 0816 12/12/15 1050 12/12/15 1717 12/13/15 0510 12/13/15 1701 12/14/15 0213  WBC 18.5* 18.6* 21.0* 17.7* 17.6* 19.5* 17.8*  NEUTROABS 16.2* 16.6*  --   --   --   --   --   HGB 8.2* 8.7* 8.0* 7.5* 7.1* 6.8* 8.2*  HCT 25.0* 26.6* 25.1* 23.4* 22.8* 21.2* 24.8*  MCV 84.1 84.5 86.1 85.4 87.3 85.0 86.6  PLT 86* 105* 143* 148* 174 187 188    Cardiac Enzymes:  Recent Labs Lab 12/07/15 1418 12/13/15 2020 12/14/15 0213 12/14/15 0809  CKTOTAL 104  --   --   --   TROPONINI  --  2.03* 2.67* 3.00*    BNP: Invalid input(s): POCBNP  CBG:  Recent Labs Lab 12/13/15 1814 12/13/15 2216 12/14/15 0155 12/14/15 0613 12/14/15 0744  GLUCAP 269* 203* 229* 181* 166*    Microbiology: Results for orders placed or performed during the hospital encounter of 12/05/15  Culture, respiratory (NON-Expectorated)     Status: None   Collection Time:  12/06/15  4:05 PM  Result Value Ref Range Status   Specimen Description NASAL SWAB  Final   Special Requests NONE  Final   Gram Stain RARE WBC SEEN FEW GRAM POSITIVE COCCI   Final   Culture HEAVY GROWTH STAPHYLOCOCCUS AUREUS  Final   Report Status 12/09/2015 FINAL  Final   Organism ID, Bacteria STAPHYLOCOCCUS AUREUS  Final      Susceptibility   Staphylococcus aureus - MIC*    CIPROFLOXACIN 2 INTERMEDIATE Intermediate     GENTAMICIN <=0.5 SENSITIVE Sensitive     OXACILLIN <=0.25 SENSITIVE Sensitive     TETRACYCLINE <=1 SENSITIVE Sensitive     VANCOMYCIN 1 SENSITIVE Sensitive     TRIMETH/SULFA <=10 SENSITIVE Sensitive     RIFAMPIN <=0.5 SENSITIVE Sensitive     Inducible Clindamycin NEGATIVE Sensitive     * HEAVY GROWTH STAPHYLOCOCCUS AUREUS  Culture, blood (Routine X 2) w Reflex to ID Panel      Status: None   Collection Time: 12/07/15 12:09 AM  Result Value Ref Range Status   Specimen Description BLOOD BLOOD RIGHT HAND  Final   Special Requests BOTTLES DRAWN AEROBIC AND ANAEROBIC 10CC  Final   Culture NO GROWTH 6 DAYS  Final   Report Status 12/13/2015 FINAL  Final  Culture, blood (Routine X 2) w Reflex to ID Panel     Status: None   Collection Time: 12/07/15 12:09 AM  Result Value Ref Range Status   Specimen Description BLOOD BLOOD LEFT HAND  Final   Special Requests BOTTLES DRAWN AEROBIC AND ANAEROBIC 10CC  Final   Culture NO GROWTH 6 DAYS  Final   Report Status 12/13/2015 FINAL  Final  Urine culture     Status: None   Collection Time: 12/07/15 12:11 AM  Result Value Ref Range Status   Specimen Description URINE, CLEAN CATCH  Final   Special Requests NONE  Final   Culture NO GROWTH 1 DAY  Final   Report Status 12/08/2015 FINAL  Final  MRSA PCR Screening     Status: None   Collection Time: 12/08/15  9:12 AM  Result Value Ref Range Status   MRSA by PCR NEGATIVE NEGATIVE Final    Comment:        The GeneXpert MRSA Assay (FDA approved for NASAL specimens only), is one component of a comprehensive MRSA colonization surveillance program. It is not intended to diagnose MRSA infection nor to guide or monitor treatment for MRSA infections.   C difficile quick scan w PCR reflex     Status: None   Collection Time: 12/11/15  1:54 PM  Result Value Ref Range Status   C Diff antigen NEGATIVE NEGATIVE Final   C Diff toxin NEGATIVE NEGATIVE Final   C Diff interpretation Negative for C. difficile  Final  Culture, blood (Routine X 2) w Reflex to ID Panel     Status: None (Preliminary result)   Collection Time: 12/13/15  9:33 AM  Result Value Ref Range Status   Specimen Description BLOOD LEFT ANTECUBITAL  Final   Special Requests BOTTLES DRAWN AEROBIC AND ANAEROBIC  4CC  Final   Culture NO GROWTH < 24 HOURS  Final   Report Status PENDING  Incomplete  Culture, blood (Routine X  2) w Reflex to ID Panel     Status: None (Preliminary result)   Collection Time: 12/13/15  9:41 AM  Result Value Ref Range Status   Specimen Description BLOOD RIGHT ARM  Final   Special Requests BOTTLES DRAWN  AEROBIC AND ANAEROBIC  2CC  Final   Culture NO GROWTH < 24 HOURS  Final   Report Status PENDING  Incomplete  Culture, expectorated sputum-assessment     Status: None   Collection Time: 12/13/15 11:30 AM  Result Value Ref Range Status   Specimen Description TRACHEAL ASPIRATE  Final   Special Requests NONE  Final   Sputum evaluation THIS SPECIMEN IS ACCEPTABLE FOR SPUTUM CULTURE  Final   Report Status 12/13/2015 FINAL  Final    Coagulation Studies: No results for input(s): LABPROT, INR in the last 72 hours.  Urinalysis: No results for input(s): COLORURINE, LABSPEC, PHURINE, GLUCOSEU, HGBUR, BILIRUBINUR, KETONESUR, PROTEINUR, UROBILINOGEN, NITRITE, LEUKOCYTESUR in the last 72 hours.  Invalid input(s): APPERANCEUR    Imaging: Dg Abd 1 View  12/14/2015  CLINICAL DATA:  Ileus, recent lower extremity amputation. EXAM: ABDOMEN - 1 VIEW COMPARISON:  12/13/2015 abdominal radiograph. FINDINGS: Right common femoral central venous catheter terminates over the IVC at the L3-4 disc level. Partially visualized is a vascular stent overlying the right inguinal region. Surgical clips overlie the lower deep pelvis. There is mild gaseous distention of the small and large bowel, improved in the interval. No evidence of pneumatosis, pneumoperitoneum or pathologic soft tissue calcifications. IMPRESSION: Improving mild adynamic ileus. Electronically Signed   By: Ilona Sorrel M.D.   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 Small volume gas in the rectum. No definite small bowel dilatation. Right femoral  approach hemodialysis catheter. The catheter tip overlies the lower IVC. No acute osseous abnormality. IMPRESSION: 1. Diffuse gaseous distension of the colon suggests postoperative ileus. 2. Right femoral approach hemodialysis catheter with the tip overlying the lower SVC. Electronically Signed   By: Jacqulynn Cadet M.D.   On: 12/13/2015 09:43   Dg Chest Port 1 View  12/14/2015  CLINICAL DATA:  Acute respiratory failure.  Subsequent encounter. EXAM: PORTABLE CHEST 1 VIEW COMPARISON:  12/13/2015 FINDINGS: Changes from the previous median sternotomy are stable. There are pulmonary anastomosis staples in both apices, also stable. Cardiopericardial silhouette is mildly enlarged, also stable. Mild interstitial thickening is subtly improved. Left lung base opacity is stable most likely due to pleural fluid. No new lung abnormalities. No pneumothorax. Right internal jugular central venous line is stable and well positioned. IMPRESSION: 1. Slight improvement in presumed pulmonary edema. 2. No other change. Persistent left lung base opacity most likely a combination of pleural fluid and atelectasis. Electronically Signed   By: Lajean Manes M.D.   On: 12/14/2015 09:13   Dg Chest Port 1 View  12/13/2015  CLINICAL DATA:  Respiratory distress. History of right lung lobectomy for lung cancer. EXAM: PORTABLE CHEST 1 VIEW COMPARISON:  Chest radiograph from earlier today. FINDINGS: Right internal jugular central venous catheter terminates at the cavoatrial junction. Sternotomy wires appear aligned and intact. Stable cardiomediastinal silhouette with mild cardiomegaly. No pneumothorax. Probable stable small left pleural effusion. Mild pulmonary edema, increased. Left lower lobe opacity, stable, probably atelectasis. IMPRESSION: 1. Mild cardiomegaly with increased mild pulmonary edema, suggesting congestive heart failure. 2. Probable stable small left pleural effusion. 3. Left lower lobe opacity, stable, probably atelectasis.  Electronically Signed   By: Ilona Sorrel M.D.   On: 12/13/2015 19:22   Dg Chest Port 1 View  12/13/2015  CLINICAL DATA:  70 year old male  with acute respiratory failure EXAM: PORTABLE CHEST 1 VIEW COMPARISON:  Prior chest x-ray 12/07/2015 FINDINGS: The tip of the endotracheal tube is 2.5 cm above the carina. A right IJ central venous catheter projects over the superior cavoatrial junction. A gastric tube is present. The tip lies below the diaphragm, presumably within the stomach. External defibrillator pads project over the left chest. Stable cardiomegaly. Patient is status post median sternotomy with evidence of prior CABG including LIMA bypass. Surgical changes suggest prior surgery in both upper lungs as well. Low inspiratory volumes with bibasilar atelectasis. No overt pulmonary edema, pneumothorax or focal airspace consolidation. IMPRESSION: 1. Low inspiratory volumes with bibasilar atelectasis. 2. Stable cardiomegaly. 3. Stable and satisfactory support apparatus. Electronically Signed   By: Jacqulynn Cadet M.D.   On: 12/13/2015 08:01     Medications:   . amiodarone 30 mg/hr (12/14/15 0700)  . lactated ringers 100 mL/hr at 12/14/15 0700  . nitroGLYCERIN    . norepinephrine (LEVOPHED) Adult infusion Stopped (12/12/15 1850)  . phenylephrine (NEO-SYNEPHRINE) Adult infusion Stopped (12/11/15 2300)   . amiodarone  150 mg Intravenous Once  . antiseptic oral rinse  7 mL Mouth Rinse BID  . aspirin  150 mg Rectal Daily  . bisacodyl  10 mg Rectal Once  . heparin subcutaneous  5,000 Units Subcutaneous Q12H  . insulin aspart  0-15 Units Subcutaneous Q4H  . insulin glargine  5 Units Subcutaneous Daily  . insulin regular  8 Units Subcutaneous Once  . metoprolol  2.5 mg Intravenous 4 times per day  . pantoprazole sodium  40 mg Per Tube Daily  . piperacillin-tazobactam (ZOSYN)  IV  3.375 g Intravenous 3 times per day  . sennosides  5 mL Per Tube BID  . sodium chloride  500 mL Intravenous Once    acetaminophen, acetaminophen **OR** [DISCONTINUED] acetaminophen, albuterol, bisacodyl, fentaNYL (SUBLIMAZE) injection, fentaNYL (SUBLIMAZE) injection, hydrALAZINE, ipratropium-albuterol, labetalol, midazolam, naLOXone (NARCAN)  injection, ondansetron **OR** ondansetron (ZOFRAN) IV, ondansetron (ZOFRAN) IV  Assessment/ Plan:  Mr. Stephen Camacho is a 70 y.o. black  male with hypertension, hyperlipidemia, diabetes mellitus type II, coronary artery disease status post 2 vessel CABG, peripheral arterial disease status post right above-the-knee amputation, seizure disorder, lung cancer, prostate cancer, history of CVA, who was admitted to [facility] on 12/05/2015 for ischemic left lower extremity. Hospital course complicated by acute renal failure, acute respiratory failure, new CVAs, myocardial infarction.  1. Acute renal failure:  ATN from contrast exposure. nonoliguric urine output. renal function stable to improving. Baseline creatinine of 0.83. Electrolytes currently at goal.  - No acute indication for dialysis at this time. Continue monitor closely for dialysis need. Nonoliguric urine output. Monitor volume status, respiratory status, urine output, electrolytes and kidney function - Renally dose all medications - IVF LR at 16m/hr - Vascath placed for temp dialysis however never used - will remove today due to fevers. Culture tip.   2. Acute respiratory failure: Status post intubation and on mechanical ventilation. With acute myocardial infarction and acute CVA. Now extubated. But with hypoxia last night and currently tachypnic.  - echocardiogram with diastolic failure.  - Appreciate cardiology and pulmonary input.   3. Ischemic left lower extremity: peripheral vascular disease: status post Left AKA on 2/17 Dr. SDelana Meyer- Appreciate vascular input.   4. Anemia with renal failure: PRBC transfusion 2/14 and 2/17.  - Continue to monitor CBC.    LOS: 9[place] SLeggett2/19/20179:27 AM

## 2015-12-14 NOTE — Progress Notes (Signed)
[place] Progress Note Patient Name: Stephen Camacho DOB: [birth year] MRN: [id number]   Date of Service  12/14/2015  HPI/Events of Note  Hypokalemia  eICU Interventions  Potassium replaced     Intervention Category Intermediate Interventions: Electrolyte abnormality - evaluation and management  DETERDING,ELIZABETH 12/14/2015, 3:16 AM

## 2015-12-14 NOTE — Progress Notes (Signed)
Subjective: Pt awake but does not communicate   Objective: Filed Vitals:   12/14/15 1000 12/14/15 1100 12/14/15 1200 12/14/15 1300  BP: 140/81 140/81 133/81 142/83  Pulse: 89 86 85 89  Temp:   98.7 F (37.1 C)   TempSrc:   Axillary   Resp: 40 26 37 39  Height:      Weight:      SpO2: 98% 98% 99% 95%   Weight change: -1.95 kg (-4 lb 4.8 oz)  Intake/Output Summary (Last 24 hours) at 12/14/15 1322 Last data filed at 12/14/15 1220  Gross per 24 hour  Intake 3507.4 ml  Output   2800 ml  Net  707.4 ml   I/O 15.6 L Positive    General: [place]  DOes not communicate  Neck:  JVP is increased   Heart: Regular rate and rhythm, without murmurs, rubs, gallops.  Lungs: Rhonchi bilaterally  Abdomen:  Sl distended Exemities:    S/p amputation bilaterally Neuro: Grossly intact, nonfocal.  Tele:  SR Lab Results: Results for orders placed or performed during the hospital encounter of 12/05/15 (from the past 24 hour(s))  Glucose, capillary     Status: Abnormal   Collection Time: 12/13/15  1:41 PM  Result Value Ref Range   Glucose-Capillary 230 (H) 65 - 99 mg/dL  CBC     Status: Abnormal   Collection Time: 12/13/15  5:01 PM  Result Value Ref Range   WBC 19.5 (H) 3.8 - 10.6 K/uL   RBC 2.50 (L) 4.40 - 5.90 MIL/uL   Hemoglobin 6.8 (L) 13.0 - 18.0 g/dL   HCT 21.2 (L) 40.0 - 52.0 %   MCV 85.0 80.0 - 100.0 fL   MCH 27.1 26.0 - 34.0 pg   MCHC 31.9 (L) 32.0 - 36.0 g/dL   RDW 15.9 (H) 11.5 - 14.5 %   Platelets 187 150 - 440 K/uL  Glucose, capillary     Status: Abnormal   Collection Time: 12/13/15  6:14 PM  Result Value Ref Range   Glucose-Capillary 269 (H) 65 - 99 mg/dL  Blood gas, arterial     Status: Abnormal   Collection Time: 12/13/15  6:46 PM  Result Value Ref Range   FIO2 0.21    Delivery systems ROOM AIR    pH, Arterial 7.66 (HH) 7.350 - 7.450   pCO2 arterial 19 (LL) 32.0 - 48.0 mmHg   pO2, Arterial 56 (L) 83.0 - 108.0 mmHg   Bicarbonate 21.4 21.0 - 28.0 mEq/L   Acid-Base Excess 3.0 0.0 - 3.0 mmol/L   O2 Saturation 94.6 %   Patient temperature 37.0    Collection site RIGHT RADIAL    Sample type ARTERIAL DRAW    Allens test (pass/fail) POSITIVE (A) PASS  Troponin I (q 6hr x 3)     Status: Abnormal   Collection Time: 12/13/15  8:20 PM  Result Value Ref Range   Troponin I 2.03 (H) <0.031 ng/mL  Glucose, capillary     Status: Abnormal   Collection Time: 12/13/15 10:16 PM  Result Value Ref Range   Glucose-Capillary 203 (H) 65 - 99 mg/dL  Glucose, capillary     Status: Abnormal   Collection Time: 12/14/15  1:55 AM  Result Value Ref Range   Glucose-Capillary 229 (H) 65 - 99 mg/dL  CBC     Status: Abnormal   Collection Time: 12/14/15  2:13 AM  Result Value Ref Range   WBC 17.8 (H) 3.8 - 10.6 K/uL   RBC 2.86 (L)  4.40 - 5.90 MIL/uL   Hemoglobin 8.2 (L) 13.0 - 18.0 g/dL   HCT 24.8 (L) 40.0 - 52.0 %   MCV 86.6 80.0 - 100.0 fL   MCH 28.6 26.0 - 34.0 pg   MCHC 33.0 32.0 - 36.0 g/dL   RDW 15.3 (H) 11.5 - 14.5 %   Platelets 188 150 - 440 K/uL  Basic metabolic panel     Status: Abnormal   Collection Time: 12/14/15  2:13 AM  Result Value Ref Range   Sodium 148 (H) 135 - 145 mmol/L   Potassium 3.3 (L) 3.5 - 5.1 mmol/L   Chloride 112 (H) 101 - 111 mmol/L   CO2 26 22 - 32 mmol/L   Glucose, Bld 248 (H) 65 - 99 mg/dL   BUN 36 (H) 6 - 20 mg/dL   Creatinine, Ser 2.02 (H) 0.61 - 1.24 mg/dL   Calcium 7.4 (L) 8.9 - 10.3 mg/dL   GFR calc non Af Amer 32 (L) >60 mL/min   GFR calc Af Amer 37 (L) >60 mL/min   Anion gap 10 5 - 15  Magnesium     Status: None   Collection Time: 12/14/15  2:13 AM  Result Value Ref Range   Magnesium 1.9 1.7 - 2.4 mg/dL  Phosphorus     Status: None   Collection Time: 12/14/15  2:13 AM  Result Value Ref Range   Phosphorus 2.5 2.5 - 4.6 mg/dL  Troponin I (q 6hr x 3)     Status: Abnormal   Collection Time: 12/14/15  2:13 AM  Result Value Ref Range   Troponin I 2.67 (H) <0.031 ng/mL  Glucose, capillary     Status: Abnormal    Collection Time: 12/14/15  6:13 AM  Result Value Ref Range   Glucose-Capillary 181 (H) 65 - 99 mg/dL  Glucose, capillary     Status: Abnormal   Collection Time: 12/14/15  7:44 AM  Result Value Ref Range   Glucose-Capillary 166 (H) 65 - 99 mg/dL  Troponin I (q 6hr x 3)     Status: Abnormal   Collection Time: 12/14/15  8:09 AM  Result Value Ref Range   Troponin I 3.00 (H) <0.031 ng/mL  Glucose, capillary     Status: Abnormal   Collection Time: 12/14/15 11:10 AM  Result Value Ref Range   Glucose-Capillary 185 (H) 65 - 99 mg/dL    Studies/Results: Dg Abd 1 View  12/14/2015  CLINICAL DATA:  Ileus, recent lower extremity amputation. EXAM: ABDOMEN - 1 VIEW COMPARISON:  12/13/2015 abdominal radiograph. FINDINGS: Right common femoral central venous catheter terminates over the IVC at the L3-4 disc level. Partially visualized is a vascular stent overlying the right inguinal region. Surgical clips overlie the lower deep pelvis. There is mild gaseous distention of the small and large bowel, improved in the interval. No evidence of pneumatosis, pneumoperitoneum or pathologic soft tissue calcifications. IMPRESSION: Improving mild adynamic ileus. Electronically Signed   By: Ilona Sorrel M.D.   On: 12/14/2015 09:11   Dg Chest Port 1 View  12/14/2015  CLINICAL DATA:  Acute respiratory failure.  Subsequent encounter. EXAM: PORTABLE CHEST 1 VIEW COMPARISON:  12/13/2015 FINDINGS: Changes from the previous median sternotomy are stable. There are pulmonary anastomosis staples in both apices, also stable. Cardiopericardial silhouette is mildly enlarged, also stable. Mild interstitial thickening is subtly improved. Left lung base opacity is stable most likely due to pleural fluid. No new lung abnormalities. No pneumothorax. Right internal jugular central venous line is stable and  well positioned. IMPRESSION: 1. Slight improvement in presumed pulmonary edema. 2. No other change. Persistent left lung base opacity most  likely a combination of pleural fluid and atelectasis. Electronically Signed   By: Lajean Manes M.D.   On: 12/14/2015 09:13   Dg Chest Port 1 View  12/13/2015  CLINICAL DATA:  Respiratory distress. History of right lung lobectomy for lung cancer. EXAM: PORTABLE CHEST 1 VIEW COMPARISON:  Chest radiograph from earlier today. FINDINGS: Right internal jugular central venous catheter terminates at the cavoatrial junction. Sternotomy wires appear aligned and intact. Stable cardiomediastinal silhouette with mild cardiomegaly. No pneumothorax. Probable stable small left pleural effusion. Mild pulmonary edema, increased. Left lower lobe opacity, stable, probably atelectasis. IMPRESSION: 1. Mild cardiomegaly with increased mild pulmonary edema, suggesting congestive heart failure. 2. Probable stable small left pleural effusion. 3. Left lower lobe opacity, stable, probably atelectasis. Electronically Signed   By: Ilona Sorrel M.D.   On: 12/13/2015 19:22    Medications: Reviewed   '@PROBHOSP'$ @  Pt Post Op Day 2.  CAD  S/P NSTEMI  LVEF normal  Note Trop 3 (trending down from greater than 60)  VT  Now on PO amiodarone  WOuld increase to 400 mg daily for right now  May not keep on long term  CVA    Renal  Nephrology following  Cr 2    Received one dose of lasix yesterday 40 mg   Getting 100 cc IVF per hour.  Volume does appear up on exam  Continue to follow renal function.   Anemia  Follow       LOS: 9 days   Dorris Carnes 12/14/2015, 1:22 PM

## 2015-12-14 NOTE — Progress Notes (Signed)
Subjective  - POD #2  Remains confused C/o pain  Physical Exam:  Dressing dry and clean       Assessment/Plan:  POD #2  Renal following for ARF.  Improving, no need for HD currently.  Vas cath removal today given fevers MI:  Cardiology following. Pulm:  Hypoxia last night imoproved   Brabham, [place] 12/14/2015 12:46 PM --  Filed Vitals:   12/14/15 0800 12/14/15 0900  BP: 137/83 139/85  Pulse: 91 91  Temp:    Resp: 26 36    Intake/Output Summary (Last 24 hours) at 12/14/15 1246 Last data filed at 12/14/15 1220  Gross per 24 hour  Intake 3507.4 ml  Output   2800 ml  Net  707.4 ml     Laboratory CBC    Component Value Date/Time   WBC 17.8* 12/14/2015 0213   WBC 4.9 10/25/2014 1331   HGB 8.2* 12/14/2015 0213   HGB 12.1* 10/25/2014 1331   HCT 24.8* 12/14/2015 0213   HCT 38.1* 10/25/2014 1331   PLT 188 12/14/2015 0213   PLT 151 10/25/2014 1331    BMET    Component Value Date/Time   NA 148* 12/14/2015 0213   NA 138 10/25/2014 1331   K 3.3* 12/14/2015 0213   K 3.9 10/25/2014 1331   CL 112* 12/14/2015 0213   CL 109* 10/25/2014 1331   CO2 26 12/14/2015 0213   CO2 21 10/25/2014 1331   GLUCOSE 248* 12/14/2015 0213   GLUCOSE 232* 10/25/2014 1331   BUN 36* 12/14/2015 0213   BUN 17 10/25/2014 1331   CREATININE 2.02* 12/14/2015 0213   CREATININE 1.11 10/25/2014 1331   CALCIUM 7.4* 12/14/2015 0213   CALCIUM 8.7 10/25/2014 1331   GFRNONAA 32* 12/14/2015 0213   GFRNONAA >60 10/25/2014 1331   GFRNONAA 52* 07/08/2014 1051   GFRAA 37* 12/14/2015 0213   GFRAA >60 10/25/2014 1331   GFRAA >60 07/08/2014 1051    COAG Lab Results  Component Value Date   INR 1.99 12/05/2015   INR 1.2 07/08/2014   INR 1.3 06/11/2014   No results found for: PTT  Antibiotics Anti-infectives    Start     Dose/Rate Route Frequency Ordered Stop   12/12/15 0600  ceFAZolin (ANCEF) IVPB 2 g/50 mL premix     2 g 100 mL/hr over 30 Minutes Intravenous On call to O.R.  12/11/15 1750 12/13/15 0559   12/10/15 0900  vancomycin (VANCOCIN) IVPB 750 mg/150 ml premix  Status:  Discontinued     750 mg 150 mL/hr over 60 Minutes Intravenous Every 36 hours 12/09/15 2301 12/10/15 1849   12/10/15 0600  vancomycin (VANCOCIN) IVPB 1000 mg/200 mL premix  Status:  Discontinued     1,000 mg 200 mL/hr over 60 Minutes Intravenous Every 24 hours 12/10/15 1849 12/11/15 1413   12/08/15 1800  vancomycin (VANCOCIN) IVPB 750 mg/150 ml premix  Status:  Discontinued     750 mg 150 mL/hr over 60 Minutes Intravenous Every 36 hours 12/07/15 0755 12/09/15 2301   12/07/15 0600  vancomycin (VANCOCIN) IVPB 750 mg/150 ml premix  Status:  Discontinued     750 mg 150 mL/hr over 60 Minutes Intravenous Every 18 hours 12/06/15 2020 12/07/15 0755   12/06/15 1930  vancomycin (VANCOCIN) IVPB 1000 mg/200 mL premix     1,000 mg 200 mL/hr over 60 Minutes Intravenous  Once 12/06/15 1854 12/06/15 2114   12/06/15 1515  piperacillin-tazobactam (ZOSYN) IVPB 3.375 g     3.375 g 12.5 mL/hr  over 240 Minutes Intravenous 3 times per day 12/06/15 1502 12/15/15 1115   12/05/15 1445  [MAR Hold]  ceFAZolin (ANCEF) IVPB 1 g/50 mL premix  Status:  Discontinued     (MAR Hold since 12/06/15 0910)  Comments:  Send with pt to OR   1 g 100 mL/hr over 30 Minutes Intravenous 3 times per day 12/05/15 1442 12/06/15 1104   12/05/15 1245  cefUROXime (ZINACEF) 1.5 g in dextrose 5 % 50 mL IVPB     1.5 g 100 mL/hr over 30 Minutes Intravenous  Once 12/05/15 1234 12/05/15 1305       V. Leia Alf, M.D. [facility] Office: [phone] Pager:  [phone]

## 2015-12-14 NOTE — Progress Notes (Signed)
PULMONARY / CRITICAL CARE MEDICINE   Name: Stephen Camacho MRN: [id number] DOB: 07/06/1946    ADMISSION DATE:  12/05/2015     SUBJECTIVE:  Doing well this morning s/p L eft AKA and extubation 02/18. Awake, confused and following some commands. Blood pressure is stable. Remains off pressors. Fever improved. Passed bed side swallow screen  VITAL SIGNS: BP 142/83 mmHg  Pulse 89  Temp(Src) 98.7 F (37.1 C) (Axillary)  Resp 39  Ht '6\' 4"'$  (1.93 m)  Wt 158 lb 6.4 oz (71.85 kg)  BMI 19.29 kg/m2  SpO2 95%  HEMODYNAMICS:    VENTILATOR SETTINGS:    INTAKE / OUTPUT: I/O last 3 completed shifts: In: 6028.2 [I.V.:4423.2; Blood:390; NG/GT:365; IV Piggyback:850] Out: 0263 [Urine:3860]  PHYSICAL EXAMINATION: General: Frail looking male, NAD Neuro: Moves upper extremities to command HEENT: PERRLA, oral mucosa pink Cardiovascular: Afib, no MRG Lungs:  bilateral airflow, scattered rhonchi/rales in all lung fields Abdomen: Soft, distended; hypoactive bowl sounds Musculoskeletal:  Bilateral AKA; left stump with dressing Extremities: +femoral pulse Skin: Warm; no rash  LABS:  BMET  Recent Labs Lab 12/12/15 0501 12/13/15 0510 12/14/15 0213  NA 144 145 148*  K 4.0 3.8 3.3*  CL 112* 111 112*  CO2 '27 28 26  '$ BUN 31* 34* 36*  CREATININE 1.76* 1.74* 2.02*  GLUCOSE 228* 298* 248*    Electrolytes  Recent Labs Lab 12/11/15 2246 12/12/15 0501 12/13/15 0510 12/14/15 0213  CALCIUM 7.5* 7.4* 7.3* 7.4*  MG 2.0 2.0  --  1.9  PHOS  --  2.4* 1.7* 2.5    CBC  Recent Labs Lab 12/13/15 0510 12/13/15 1701 12/14/15 0213  WBC 17.6* 19.5* 17.8*  HGB 7.1* 6.8* 8.2*  HCT 22.8* 21.2* 24.8*  PLT 174 187 188    Coag's No results for input(s): APTT, INR in the last 168 hours.  Sepsis Markers  Recent Labs Lab 12/07/15 1418  LATICACIDVEN 3.0*    ABG  Recent Labs Lab 12/13/15 1846  PHART 7.66*  PCO2ART 19*  PO2ART 56*    Liver Enzymes  Recent Labs Lab  12/09/15 0427  ALBUMIN 1.9*    Cardiac Enzymes  Recent Labs Lab 12/13/15 2020 12/14/15 0213 12/14/15 0809  TROPONINI 2.03* 2.67* 3.00*    Glucose  Recent Labs Lab 12/13/15 1814 12/13/15 2216 12/14/15 0155 12/14/15 0613 12/14/15 0744 12/14/15 1110  GLUCAP 269* 203* 229* 181* 166* 185*    Imaging Dg Abd 1 View  12/14/2015  CLINICAL DATA:  Ileus, recent lower extremity amputation. EXAM: ABDOMEN - 1 VIEW COMPARISON:  12/13/2015 abdominal radiograph. FINDINGS: Right common femoral central venous catheter terminates over the IVC at the L3-4 disc level. Partially visualized is a vascular stent overlying the right inguinal region. Surgical clips overlie the lower deep pelvis. There is mild gaseous distention of the small and large bowel, improved in the interval. No evidence of pneumatosis, pneumoperitoneum or pathologic soft tissue calcifications. IMPRESSION: Improving mild adynamic ileus. Electronically Signed   By: Ilona Sorrel M.D.   On: 12/14/2015 09:11   Dg Chest Port 1 View  12/14/2015  CLINICAL DATA:  Acute respiratory failure.  Subsequent encounter. EXAM: PORTABLE CHEST 1 VIEW COMPARISON:  12/13/2015 FINDINGS: Changes from the previous median sternotomy are stable. There are pulmonary anastomosis staples in both apices, also stable. Cardiopericardial silhouette is mildly enlarged, also stable. Mild interstitial thickening is subtly improved. Left lung base opacity is stable most likely due to pleural fluid. No new lung abnormalities. No pneumothorax. Right internal jugular central venous line  is stable and well positioned. IMPRESSION: 1. Slight improvement in presumed pulmonary edema. 2. No other change. Persistent left lung base opacity most likely a combination of pleural fluid and atelectasis. Electronically Signed   By: Lajean Manes M.D.   On: 12/14/2015 09:13   Dg Chest Port 1 View  12/13/2015  CLINICAL DATA:  Respiratory distress. History of right lung lobectomy for lung  cancer. EXAM: PORTABLE CHEST 1 VIEW COMPARISON:  Chest radiograph from earlier today. FINDINGS: Right internal jugular central venous catheter terminates at the cavoatrial junction. Sternotomy wires appear aligned and intact. Stable cardiomediastinal silhouette with mild cardiomegaly. No pneumothorax. Probable stable small left pleural effusion. Mild pulmonary edema, increased. Left lower lobe opacity, stable, probably atelectasis. IMPRESSION: 1. Mild cardiomegaly with increased mild pulmonary edema, suggesting congestive heart failure. 2. Probable stable small left pleural effusion. 3. Left lower lobe opacity, stable, probably atelectasis. Electronically Signed   By: Ilona Sorrel M.D.   On: 12/13/2015 19:22   STUDIES:  02/12: 2-D WKMQ>28-63%; grade 1 diastolic dysfunction, LVH  CULTURES: 02/11>Blood>NTD 02/11>Sputum>STAPHYLOCOCCUS AUREUS  02/18 Blood x2>> 02/18 Urine>> 02/18 Respiratory>>  ANTIBIOTICS: Piperacillin-tazobactam 02/11>  SIGNIFICANT EVENTS: 02/07>ED with SOB and left calf pain>treated and released 02/09>ED with cold left foot and calf pain>CT angio>complete occlusion of the left fem-pop bypass graft>admitted 02/11>OR for angiogram with thrombolysis, thrombectomy of left peroneal artery, tibioperoneal trunk, popliteal artery, and femoral to popliteal bypass graft and percutaneous transluminal angioplasty of the left peroneal artery and distal bypass anastomosis and popliteal artery Post-op: residual thrombosis of femoral to popliteal bypass graft and all tibial vessels; acute respiratory failure>Intubated   LINES/TUBES: Right IJ tlc 02/10> ETT 02/11> L Fem vascath 2/13>>  DISCUSSION: 70 YO male with acute complete occlusion of the  left fem-pop bypass graft s/p thrombolysis/thrombectomy/percuteneous transluminal angioplasty with residual thrombosis, sepsis secondary to possible necrosis from left leg thrombosis, and  acute hypoxic respiratory failure secondary to sepsis and  complicated by acute NSTEMI and worsening renal function-Now improved without need for CRRT. OR 02/18 for left AKA ; fever and hypotension post-op; now improved and extubated.   ASSESSMENT / PLAN:  PULMONARY A: Acute hypoxic respiratory failure Possible aspiration pneumonia-Copious amount of secretions noted in airway during intubation-now with improvement; respiratory cultures positive for staph aureus  Severe metabolic acidosis - resolved H/O Lung cancer s/p right lung lobectomy P:   -Extubated 02/18, respiratory status stable.  -Supplemental O2 to keep SPO2>88% -Oral hygiene -Albuterol and Duoneb prn  -Abx as above -F/U cultures - respiratory now with staph, currently being treated.   CARDIOVASCULAR-NSTEMI A:  Bradycardia-Resolved; Now in ST Septic shock-resolved H/O hypertension NSTEMI Afib Short runs of V-tach-amino and metoprolol started 2/17 P:  -Weaned off pressors.  -Hemodynamics per ICU protocol -Continue metoprolol for afib -Change amio to PO today.  -Monitor and replace electrolytes.  -Cardiology following - rec low dose ASA -off anticoagulation 2/2 surgery  RENAL A:   AKI secondary procedural IV dye and volume depletion; baseline creatinine 0.83 P:   -IVF, advance diet.  -Trend creatinine - currently improving -Renally dose meds -nephro following - vascath placed removed 02/19.   GASTROINTESTINAL A:   Ileus P:   -PPI for GI prophylaxis -Monitor BMs -Continue bowel regimen.  -Passed bedside swallow screen -Advance diet as tolerated  HEMATOLOGIC A:   Left leg ischemia-Complete Occlusion of the left fem-pop bypass graft s/p thrombolysis/PTA with residual thrombosis Right neck hematoma s/p TLC placement-improved Severe PVD s/p Right AKA Acute blood loss Anemia improved post-transfusion P:  -  Vascular surgery following transfused I unit of PRBCs for a Hg of 6.8 -Monitor for bleeding-transfuse as needed,  INFECTIOUS A:   Septic shock-likely  source is left leg ischemia/necrosis from massive thrombus-Resolving Fever P:   -Antibiotics as above -F/U cultures -ID/C Vac cath and culture tip  ENDOCRINE A:   Type 2 diabetes mellitus   P:   -Blood glucose monitoring with SSI coverage -Hold oral hypoglycemic agents  NEUROLOGIC A:   Acute metabolic encephalopathy H/o 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 H/O Seizures-Not on home seizure meds  Acute post-op pain P:   RASS goal: -2 -PRN fentanyl for pain -Monitor for seizure activity -Aspiration precautions   Family update: No family at bedside during 02/19 morning rounds.  Transfer patient to telemetry.    Magdalene S. Tukov ANP-BC Pulmonary and Critical Care Medicine [facility] Pager [phone]  Patient seen and examined, agree with the assessment and plan, participated in formulation of the assessment and plan with the nurse practitioner. Ok to transfer to floor.   Marda Stalker, M.D.

## 2015-12-15 ENCOUNTER — Encounter: Payer: Self-pay | Admitting: Vascular Surgery

## 2015-12-15 ENCOUNTER — Inpatient Hospital Stay: Payer: PPO

## 2015-12-15 LAB — CBC
HCT: 24.4 % — ABNORMAL LOW (ref 40.0–52.0)
Hemoglobin: 8.1 g/dL — ABNORMAL LOW (ref 13.0–18.0)
MCH: 27.9 pg (ref 26.0–34.0)
MCHC: 33.1 g/dL (ref 32.0–36.0)
MCV: 84.4 fL (ref 80.0–100.0)
PLATELETS: 213 10*3/uL (ref 150–440)
RBC: 2.89 MIL/uL — ABNORMAL LOW (ref 4.40–5.90)
RDW: 15.7 % — AB (ref 11.5–14.5)
WBC: 19.1 10*3/uL — AB (ref 3.8–10.6)

## 2015-12-15 LAB — BASIC METABOLIC PANEL
Anion gap: 10 (ref 5–15)
BUN: 50 mg/dL — ABNORMAL HIGH (ref 6–20)
CHLORIDE: 115 mmol/L — AB (ref 101–111)
CO2: 22 mmol/L (ref 22–32)
CREATININE: 2.37 mg/dL — AB (ref 0.61–1.24)
Calcium: 7.3 mg/dL — ABNORMAL LOW (ref 8.9–10.3)
GFR calc non Af Amer: 26 mL/min — ABNORMAL LOW (ref >60)
GFR, EST AFRICAN AMERICAN: 30 mL/min — AB (ref >60)
Glucose, Bld: 172 mg/dL — ABNORMAL HIGH (ref 65–99)
POTASSIUM: 3.6 mmol/L (ref 3.5–5.1)
SODIUM: 147 mmol/L — AB (ref 135–145)

## 2015-12-15 LAB — GLUCOSE, CAPILLARY
GLUCOSE-CAPILLARY: 157 mg/dL — AB (ref 65–99)
GLUCOSE-CAPILLARY: 165 mg/dL — AB (ref 65–99)
GLUCOSE-CAPILLARY: 121 mg/dL — AB (ref 65–99)
Glucose-Capillary: 146 mg/dL — ABNORMAL HIGH (ref 65–99)
Glucose-Capillary: 143 mg/dL — ABNORMAL HIGH (ref 65–99)
Glucose-Capillary: 146 mg/dL — ABNORMAL HIGH (ref 65–99)

## 2015-12-15 LAB — PHOSPHORUS: PHOSPHORUS: 3.7 mg/dL (ref 2.5–4.6)

## 2015-12-15 LAB — MAGNESIUM: MAGNESIUM: 2 mg/dL (ref 1.7–2.4)

## 2015-12-15 MED ORDER — MORPHINE SULFATE (PF) 2 MG/ML IV SOLN
2.0000 mg | INTRAVENOUS | Status: DC | PRN
  Administered 2015-12-15 – 2015-12-16 (×2): 2 mg via INTRAVENOUS
  Filled 2015-12-15 (×2): qty 1

## 2015-12-15 MED ORDER — POTASSIUM CHLORIDE IN NACL 20-0.45 MEQ/L-% IV SOLN
INTRAVENOUS | Status: DC
  Administered 2015-12-15: 11:00:00 via INTRAVENOUS
  Filled 2015-12-15 (×2): qty 1000

## 2015-12-15 NOTE — Progress Notes (Signed)
MD paged on [place] received orders to discontinue pts IVF due to crackles.  She also got order for IV morphine.  MD- Dr. Marcille Blanco paged to get orders placed.  Will continue to monitor Stephen Camacho

## 2015-12-15 NOTE — Care Management (Signed)
POD # 3 s/p left AKA. Moved out of ICU 2/19. Rectal foley remains. WBC 19.1. Worsening renal function. Total care at this point. It is anticipated that patient will need SNF placement.

## 2015-12-15 NOTE — Care Management Important Message (Signed)
Important Message  Patient Details  Name: Stephen Camacho MRN: [id number] Date of Birth: 1946-07-04   Medicare Important Message Given:  Yes    Juliann Pulse A [place]emell [place] 12/15/2015, 1:42 PM

## 2015-12-15 NOTE — Progress Notes (Signed)
Nutrition Follow-up   INTERVENTION:   Medical Food Supplement Therapy: recommend addition of Mighty Shakes, Magic Cup on meal trays  NUTRITION DIAGNOSIS:   Inadequate oral intake related to inability to eat as evidenced by NPO status. Being addressed as diet advanced, adding supplements  GOAL:   Patient will meet greater than or equal to 90% of their needs  MONITOR:    (Energy Intake, Electrolyte and renal Profile, Anthropometrics, Digestive System, Glucose Profile, Pulmonary Profile)  REASON FOR ASSESSMENT:   Ventilator, Consult Enteral/tube feeding initiation and management  ASSESSMENT:   Per MD note: Pt admitted with acute complete occlusion of the left fem-pop bypass graft s/p thrombolysis/thrombectomy/percuteneous transluminal angioplasty with residual thrombosis, sepsis secondary to possible necrosis from left leg thrombosis, and acute hypoxic respiratory failure secondary to sepsis and complicated by acute NSTEMI.  Pt s/p extubation, diet advanced post SLP evaluation, pt confused; post op left AKA 2/17  Diet Order:  DIET - DYS 1 Room service appropriate?: Yes with Assist; Fluid consistency:: Nectar Thick   Energy Intake: recorded po intake 100% of breakfast tray (CL this AM), diet just advanced  Last BM:  12/07/2015   Glucose Profile:  Recent Labs  12/15/15 0538 12/15/15 0739 12/15/15 1137  GLUCAP 157* 165* 143*    Recent Labs Lab 12/12/15 0501 12/13/15 0510 12/14/15 0213 12/15/15 0500  NA 144 145 148* 147*  K 4.0 3.8 3.3* 3.6  CL 112* 111 112* 115*  CO2 '27 28 26 22  '$ BUN 31* 34* 36* 50*  CREATININE 1.76* 1.74* 2.02* 2.37*  CALCIUM 7.4* 7.3* 7.4* 7.3*  MG 2.0  --  1.9 2.0  PHOS 2.4* 1.7* 2.5 3.7  GLUCOSE 228* 298* 248* 172*    Meds: 1/2 NS at 50 ml/hr, ss novolog, lantus  Height:   Ht Readings from Last 1 Encounters:  12/05/15 '6\' 4"'$  (1.93 m)    Weight:   Wt Readings from Last 1 Encounters:  12/15/15 160 lb 4.8 oz (72.712 kg)    BMI:   Body mass index is 19.52 kg/(m^2).  Estimated Nutritional Needs:   Kcal:  1661 kcals (Ve: 12.4, Tmax: 38.6) using wt of 73.8 kg and length of 44 inches  Protein:  111-148 g (1.5-2.0 g/kg) using wt of 74 kg  Fluid:  1850-2220 mL (25-30 ml/kg)   HIGH Care Level  Kerman Passey MS, RD, LDN [phone] Pager  [phone] Weekend/On-Call Pager

## 2015-12-15 NOTE — Progress Notes (Signed)
PA notified again after hospitalist recommendation of bipap if no lasix- BUN/CREA are elevated.  Verbal order to transfer to CCU Jessee Avers

## 2015-12-15 NOTE — Progress Notes (Signed)
Dr. Delana Meyer notified for previous findings- respiratory suggestions- Pt getting breathing treatment now/had oral suctioning.  MD gave verbal order to get a stat CXR- make him NPO- if oxygen needs increase he needs to be transferred back to CCU/no lasix order at this time- will wait results of CXR.   Jessee Avers

## 2015-12-15 NOTE — Progress Notes (Signed)
Patient: Stephen Camacho / Admit Date: 12/05/2015 / Date of Encounter: 12/15/2015, 7:52 AM   Subjective: Patient awake and minimally commutative this morning. WBC count trending up. CXR from 2/19 showed slight improvement in presumed pulmonary edema with persistent left lung base opacity likely combination of pleural fluid and atelectasis.       Review of Systems: Review of Systems  Unable to perform ROS: medical condition     Objective: Telemetry: NSR, 90's, frequent PVCs Physical Exam: Blood pressure 141/74, pulse 94, temperature 98.7 F (37.1 C), temperature source Oral, resp. rate 20, height '6\' 4"'$  (1.93 m), weight 160 lb 4.8 oz (72.712 kg), SpO2 97 %. Body mass index is 19.52 kg/(m^2). General: Well developed, well nourished, in no acute distress. Head: Normocephalic, atraumatic, sclera non-icteric, no xanthomas, nares are without discharge. Neck: Negative for carotid bruits. JVP elevated. Lungs: Decreased breath sounds bilaterally with rhonchi and crackles along the left base. Breathing is unlabored. Heart: RRR S1 S2 without murmurs, rubs, or gallops.  Abdomen: Soft, non-tender, non-distended with normoactive bowel sounds. No rebound/guarding. Extremities: Status post bilateral amputation. Neuro: Alert and oriented X 3. Moves all extremities spontaneously. Psych:  Responds to questions appropriately with a normal affect.   Intake/Output Summary (Last 24 hours) at 12/15/15 0752 Last data filed at 12/15/15 0700  Gross per 24 hour  Intake 1052.71 ml  Output   1200 ml  Net -147.29 ml    Inpatient Medications:  . amiodarone  150 mg Intravenous Once  . amiodarone  200 mg Oral BID  . antiseptic oral rinse  7 mL Mouth Rinse BID  . aspirin  150 mg Rectal Daily  . bisacodyl  10 mg Rectal Once  . heparin subcutaneous  5,000 Units Subcutaneous Q12H  . insulin aspart  0-15 Units Subcutaneous Q4H  . insulin glargine  5 Units Subcutaneous Daily  . insulin regular  8 Units  Subcutaneous Once  . metoprolol  2.5 mg Intravenous 4 times per day  . pantoprazole sodium  40 mg Per Tube Daily  . piperacillin-tazobactam (ZOSYN)  IV  3.375 g Intravenous 3 times per day  . polyethylene glycol  17 g Oral Daily  . senna-docusate  1 tablet Oral BID  . sennosides  5 mL Per Tube BID  . sodium chloride  500 mL Intravenous Once   Infusions:  . lactated ringers 50 mL/hr at 12/15/15 0555  . nitroGLYCERIN    . phenylephrine (NEO-SYNEPHRINE) Adult infusion Stopped (12/11/15 2300)    Labs:  Recent Labs  12/14/15 0213 12/15/15 0500  NA 148* 147*  K 3.3* 3.6  CL 112* 115*  CO2 26 22  GLUCOSE 248* 172*  BUN 36* 50*  CREATININE 2.02* 2.37*  CALCIUM 7.4* 7.3*  MG 1.9 2.0  PHOS 2.5 3.7   No results for input(s): AST, ALT, ALKPHOS, BILITOT, PROT, ALBUMIN in the last 72 hours.  Recent Labs  12/14/15 0213 12/15/15 0500  WBC 17.8* 19.1*  HGB 8.2* 8.1*  HCT 24.8* 24.4*  MCV 86.6 84.4  PLT 188 213    Recent Labs  12/13/15 2020 12/14/15 0213 12/14/15 0809  TROPONINI 2.03* 2.67* 3.00*   Invalid input(s): POCBNP No results for input(s): HGBA1C in the last 72 hours.   Weights: Filed Weights   12/14/15 0300 12/14/15 1616 12/15/15 0500  Weight: 158 lb 6.4 oz (71.85 kg) 160 lb 8 oz (72.802 kg) 160 lb 4.8 oz (72.712 kg)     Radiology/Studies:  Dg Chest 1 View  12/07/2015  CLINICAL DATA:  Hypoxia EXAM: CHEST 1 VIEW COMPARISON:  December 06, 2015 FINDINGS: Endotracheal tube tip is 4.1 cm above the carina. Nasogastric tube tip and side port are in the stomach. Central catheter tip is in the superior vena cava. No pneumothorax. There is postoperative change in the medial aspect of the left upper lobe. There is also postoperative change in the medial right upper lobe. There is mild scarring in the right base. There is no edema or consolidation. The heart size and pulmonary vascularity are within normal limits. No adenopathy. IMPRESSION: Postoperative change in both  upper lobes. No edema or consolidation. Tube and catheter positions as described without pneumothorax. Electronically Signed   By: Lowella Grip III M.D.   On: 12/07/2015 08:39   Dg Chest 2 View  12/02/2015  CLINICAL DATA:  Short of breath since yesterday. EXAM: CHEST  2 VIEW COMPARISON:  CT chest 06/23/2015 FINDINGS: The lungs are hyperinflated likely secondary to COPD. Postsurgical changes at the lung apices bilaterally There is no focal parenchymal opacity. There is no pleural effusion or pneumothorax. The heart and mediastinal contours are unremarkable. There is evidence of prior CABG. The osseous structures are unremarkable. IMPRESSION: No active cardiopulmonary disease. Electronically Signed   By: Kathreen Devoid   On: 12/02/2015 11:10   Dg Abd 1 View  12/14/2015  CLINICAL DATA:  Ileus, recent lower extremity amputation. EXAM: ABDOMEN - 1 VIEW COMPARISON:  12/13/2015 abdominal radiograph. FINDINGS: Right common femoral central venous catheter terminates over the IVC at the L3-4 disc level. Partially visualized is a vascular stent overlying the right inguinal region. Surgical clips overlie the lower deep pelvis. There is mild gaseous distention of the small and large bowel, improved in the interval. No evidence of pneumatosis, pneumoperitoneum or pathologic soft tissue calcifications. IMPRESSION: Improving mild adynamic ileus. Electronically Signed   By: Ilona Sorrel M.D.   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 Small volume gas in the rectum. No definite small bowel dilatation. Right femoral approach hemodialysis catheter. The catheter tip overlies the lower IVC. No acute osseous abnormality. IMPRESSION: 1. Diffuse gaseous distension of the colon suggests postoperative ileus. 2. Right femoral approach  hemodialysis catheter with the tip overlying the lower SVC. Electronically Signed   By: Jacqulynn Cadet M.D.   On: 12/13/2015 09:43   Ct Head Wo Contrast  12/07/2015  CLINICAL DATA:  Encephalopathy. Soft tissue swelling in the neck. Occlusion of lower extremity graft. EXAM: CT HEAD WITHOUT CONTRAST CT NECK WITHOUT CONTRAST TECHNIQUE: Contiguous axial images were obtained from the base of the skull through the vertex without contrast. Multidetector CT imaging of the neck was performed using the standard protocol without intravenous contrast. COMPARISON:  CT head without contrast 10/27/2015. FINDINGS: CT HEAD FINDINGS Extensive periventricular and subcortical white matter disease is again noted. The basal ganglia are intact. Hypoattenuation within the internal capsule is not significantly changed. The insular ribbon is within normal limits bilaterally. There is loss of gray-white differentiation in the right PCA distribution suggesting an acute/subacute infarct. No associated hemorrhage is present. Bilateral cerebellar infarcts are also suggested. The fourth ventricle is intact. There is no hydrocephalus. No significant extra-axial fluid collection is present. The paranasal sinuses and mastoid air cells are clear. The calvarium is intact. CT NECK FINDINGS A right-sided catheter is  in place. The catheter appears to be extraluminal in the neck to the level of the thoracic inlet. The distal tip may be in the SVC. Diffuse subcutaneous stranding is more prominent right than left. There is asymmetric enlargement of the right sternocleidomastoid muscle and strap muscles. No discrete hyperdense fluid collection is present. Asymmetric subcutaneous edema extends over the right chest. There is no significant fluid collection in the superior mediastinum. The patient is intubated. An NG tube is in place. Atherosclerotic calcifications are present at the aortic arch and carotid bifurcations. Advanced degenerative changes are  present throughout the cervical spine with mild rightward curvature and extensive facet disease. IMPRESSION: 1. 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 2. Otherwise stable extensive white matter disease. 3. The right neck venous catheter appears to be extra luminal at least to the thoracic inlet. 4. Asymmetric subcutaneous and intramuscular edema and possibly hemorrhage within the right neck and chest. The extraluminal position of the catheter may be contributing. 5. No discrete fluid collection or hemorrhage in the neck. 6. Atherosclerosis. These results were called by telephone at the time of interpretation on 12/07/2015 at 10:02 am to Dr. Mortimer Fries , who verbally acknowledged these results. Electronically Signed   By: San Morelle M.D.   On: 12/07/2015 10:04   Ct Soft Tissue Neck Wo Contrast  12/07/2015  CLINICAL DATA:  Encephalopathy. Soft tissue swelling in the neck. Occlusion of lower extremity graft. EXAM: CT HEAD WITHOUT CONTRAST CT NECK WITHOUT CONTRAST TECHNIQUE: Contiguous axial images were obtained from the base of the skull through the vertex without contrast. Multidetector CT imaging of the neck was performed using the standard protocol without intravenous contrast. COMPARISON:  CT head without contrast 10/27/2015. FINDINGS: CT HEAD FINDINGS Extensive periventricular and subcortical white matter disease is again noted. The basal ganglia are intact. Hypoattenuation within the internal capsule is not significantly changed. The insular ribbon is within normal limits bilaterally. There is loss of gray-white differentiation in the right PCA distribution suggesting an acute/subacute infarct. No associated hemorrhage is present. Bilateral cerebellar infarcts are also suggested. The fourth ventricle is intact. There is no hydrocephalus. No significant extra-axial fluid collection is present. The paranasal sinuses and mastoid air  cells are clear. The calvarium is intact. CT NECK FINDINGS A right-sided catheter is in place. The catheter appears to be extraluminal in the neck to the level of the thoracic inlet. The distal tip may be in the SVC. Diffuse subcutaneous stranding is more prominent right than left. There is asymmetric enlargement of the right sternocleidomastoid muscle and strap muscles. No discrete hyperdense fluid collection is present. Asymmetric subcutaneous edema extends over the right chest. There is no significant fluid collection in the superior mediastinum. The patient is intubated. An NG tube is in place. Atherosclerotic calcifications are present at the aortic arch and carotid bifurcations. Advanced degenerative changes are present throughout the cervical spine with mild rightward curvature and extensive facet disease. IMPRESSION: 1. 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 2. Otherwise stable extensive white matter disease. 3. The right neck venous catheter appears to be extra luminal at least to the thoracic inlet. 4. Asymmetric subcutaneous and intramuscular edema and possibly hemorrhage within the right neck and chest. The extraluminal position of the catheter may be contributing. 5. No discrete fluid collection or hemorrhage in the neck. 6. Atherosclerosis. These results were called by telephone at the time of interpretation on 12/07/2015  at 10:02 am to Dr. Mortimer Fries , who verbally acknowledged these results. Electronically Signed   By: San Morelle M.D.   On: 12/07/2015 10:04   Ct Angio Low Extrem Left W/cm &/or Wo/cm  12/05/2015  CLINICAL DATA:  Acute onset of left leg pain. Cold left foot. Personal history of left-sided fem-pop bypass graft. Initial encounter. EXAM: CT ANGIOGRAPHY OF THE LEFT LOWER EXTREMITY TECHNIQUE: Multidetector CT imaging of the left lower extremity was performed using the standard protocol during bolus  administration of intravenous contrast. Multiplanar CT image reconstructions and MIPs were obtained to evaluate the vascular anatomy. CONTRAST:  124m OMNIPAQUE IOHEXOL 350 MG/ML SOLN COMPARISON:  Left lower extremity venous Doppler ultrasound performed 12/02/2015 FINDINGS: There is complete occlusion of the patient's left-sided fem-pop bypass graft. This corresponds to the patient's acute symptoms. There is vague diffuse apparent aneurysmal dilatation along the course of the fem-pop bypass graft, which also appears completely occluded. 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 A small amount of blood flow is noted tracking into the profunda femoris artery and its branches, which tracks along small branch vessels distally to the level of the knee. Diffuse calcification is noted along the abdominal aorta and its branches. 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 Visualized small and large bowel loops are grossly unremarkable. The visualized musculature is grossly unremarkable in appearance. No acute osseous abnormalities are seen. No knee joint effusion is identified. Postoperative change is noted about the prostate bed. The bladder is mildly distended and grossly unremarkable. Review of the MIP images confirms the above findings. IMPRESSION: 1. Complete occlusion of the patient's left-sided fem-pop bypass graft, corresponding to the patient's acute symptoms. Underlying vague diffuse apparent aneurysmal dilatation along the course of the fem-pop bypass graft, which also appears completely occluded. 2. 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 Small amount of blood flow tracks into the profunda femoris artery and its branches, which extends along a small branch  vessels distally to the level of the knee and likely explains residual left-sided pedal pulses. 3. Diffuse calcification along the abdominal aorta and its branches. 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 Critical Value/emergent results were called by telephone at the time of interpretation on 12/05/2015 at 4:43 am to Dr. RMarjean Donna who verbally acknowledged these results. Electronically Signed   By: JGarald BaldingM.D.   On: 12/05/2015 04:49   [place]ous Img Lower Unilateral Left  12/02/2015  CLINICAL DATA:  70year old presenting with a 2 day history of left calf pain. Personal history of right lower extremity above knee amputation. EXAM: LEFT LOWER EXTREMITY VENOUS DOPPLER ULTRASOUND TECHNIQUE: Gray-scale sonography with graded compression, as well as color Doppler and duplex ultrasound were performed to evaluate the lower extremity deep venous systems from the level of the common femoral vein and including the common femoral, femoral, profunda femoral, popliteal and calf veins including the posterior tibial, peroneal and gastrocnemius veins when visible. The superficial great saphenous vein was also interrogated. Spectral Doppler was utilized to evaluate flow at rest and with distal augmentation maneuvers in the common femoral, femoral and popliteal veins. COMPARISON:  None. FINDINGS: Contralateral Common Femoral Vein: Respiratory phasicity is normal and symmetric with the symptomatic side. No evidence of thrombus. Normal compressibility. Common Femoral Vein: No evidence of thrombus. Normal compressibility, respiratory phasicity  and response to augmentation. Saphenofemoral Junction: No evidence of thrombus. Normal compressibility and flow on color Doppler imaging. Profunda Femoral Vein: No evidence of thrombus. Normal compressibility and flow on color Doppler imaging. Femoral Vein: No evidence of thrombus. Normal compressibility, respiratory phasicity and response to augmentation.  Popliteal Vein: No evidence of thrombus. Normal compressibility, respiratory phasicity and response to augmentation. Calf Veins: No evidence of thrombus. Normal compressibility and flow on color Doppler imaging. Superficial Great Saphenous Vein: Not evaluated. Venous Reflux:  Not evaluated. Other Findings: Arterial bypass graft in the left lower extremity which was not evaluated on this lower extremity venous examination. IMPRESSION: No evidence of left lower extremity DVT. Electronically Signed   By: Evangeline Dakin M.D.   On: 12/02/2015 12:02   Dg Chest Port 1 View  12/14/2015  CLINICAL DATA:  Acute respiratory failure.  Subsequent encounter. EXAM: PORTABLE CHEST 1 VIEW COMPARISON:  12/13/2015 FINDINGS: Changes from the previous median sternotomy are stable. There are pulmonary anastomosis staples in both apices, also stable. Cardiopericardial silhouette is mildly enlarged, also stable. Mild interstitial thickening is subtly improved. Left lung base opacity is stable most likely due to pleural fluid. No new lung abnormalities. No pneumothorax. Right internal jugular central venous line is stable and well positioned. IMPRESSION: 1. Slight improvement in presumed pulmonary edema. 2. No other change. Persistent left lung base opacity most likely a combination of pleural fluid and atelectasis. Electronically Signed   By: Lajean Manes M.D.   On: 12/14/2015 09:13   Dg Chest Port 1 View  12/13/2015  CLINICAL DATA:  Respiratory distress. History of right lung lobectomy for lung cancer. EXAM: PORTABLE CHEST 1 VIEW COMPARISON:  Chest radiograph from earlier today. FINDINGS: Right internal jugular central venous catheter terminates at the cavoatrial junction. Sternotomy wires appear aligned and intact. Stable cardiomediastinal silhouette with mild cardiomegaly. No pneumothorax. Probable stable small left pleural effusion. Mild pulmonary edema, increased. Left lower lobe opacity, stable, probably atelectasis.  IMPRESSION: 1. Mild cardiomegaly with increased mild pulmonary edema, suggesting congestive heart failure. 2. Probable stable small left pleural effusion. 3. Left lower lobe opacity, stable, probably atelectasis. Electronically Signed   By: Ilona Sorrel M.D.   On: 12/13/2015 19:22   Dg Chest Port 1 View  12/13/2015  CLINICAL DATA:  70 year old male with acute respiratory failure EXAM: PORTABLE CHEST 1 VIEW COMPARISON:  Prior chest x-ray 12/07/2015 FINDINGS: The tip of the endotracheal tube is 2.5 cm above the carina. A right IJ central venous catheter projects over the superior cavoatrial junction. A gastric tube is present. The tip lies below the diaphragm, presumably within the stomach. External defibrillator pads project over the left chest. Stable cardiomegaly. Patient is status post median sternotomy with evidence of prior CABG including LIMA bypass. Surgical changes suggest prior surgery in both upper lungs as well. Low inspiratory volumes with bibasilar atelectasis. No overt pulmonary edema, pneumothorax or focal airspace consolidation. IMPRESSION: 1. Low inspiratory volumes with bibasilar atelectasis. 2. Stable cardiomegaly. 3. Stable and satisfactory support apparatus. Electronically Signed   By: Jacqulynn Cadet M.D.   On: 12/13/2015 08:01   Dg Chest Port 1 View  12/06/2015  CLINICAL DATA:  Hypoxia EXAM: PORTABLE CHEST 1 VIEW COMPARISON:  December 02, 2015 FINDINGS: Endotracheal tube tip is 4.8 cm above the carina. Nasogastric tube tip is in the stomach with the side port at the gastroesophageal junction. Central catheter tip is in the superior vena cava. No pneumothorax. There is scarring in the right and left lung bases. There  is postoperative change in the medial right upper lobe region. Lungs elsewhere clear. Heart size and pulmonary vascularity are normal. No adenopathy. No bone lesions. Patient is status post coronary artery bypass grafting. IMPRESSION: Tube and catheter positions as described  without pneumothorax. Note that the nasogastric tube side port is at the gastroesophageal junction. Advise advancing nasogastric tube 4-5 cm. There is bibasilar lung scarring with postoperative change in the right upper lobe. No edema or consolidation. No change in cardiac silhouette. These results will be called to the ordering clinician or representative by the Radiologist Assistant, and communication documented in the PACS or zVision Dashboard. Electronically Signed   By: Lowella Grip III M.D.   On: 12/06/2015 13:51   Dg Abd Portable 1v  12/06/2015  CLINICAL DATA:  Intubation, OG tube placement. History of coronary artery disease, lung cancer, prostate cancer, stroke and diabetes EXAM: PORTABLE ABDOMEN - 1 VIEW COMPARISON:  None. FINDINGS: The OG tube is only partially seen at the upper aspects of this exam, of uncertain position, presumably in the stomach. Bowel gas pattern is nonobstructive. Moderate amount of stool throughout the grossly nondistended colon. Hyperdense material within the right upper quadrant is of uncertain etiology or significance, possibly contrast within the gallbladder. No evidence of free intraperitoneal air seen. Osseous structures are unremarkable. IMPRESSION: 1. OG tube incompletely visualized at the upper aspects of this study. On a chest x-ray performed earlier same day, tip is seen in the stomach but with proximal side holes at the level of the gastroesophageal junction. Recommend advancing for more optimal radiographic positioning. 2. Nonobstructive bowel gas pattern. Fairly large amount of stool within the colon (constipation? ). 3. Hyperdense material within the right upper quadrant, of uncertain etiology or significance, most likely vicarious excretion of contrast within the gallbladder related to patient's recent CT angiogram runoff. Electronically Signed   By: Franki Cabot M.D.   On: 12/06/2015 14:00     Assessment and Plan   1. CAD s/p NSTEMI: Echo showed normal  LV systolic function. Continue aspirin PR.  2. VT: Episodes of VT over the weekend. On amiodarone 200 mg bid. No further episodes seen on telemetry. Continue amiodarone for now. May not need long term, no LV systolic function.   3. Acute respiratory failure: Status post intubation. CXR on 2/19 with persistent pulmonary edema with possible atelectasis. Needs continued gentle diuresis given his renal function. Also ABX per IM.  4. Acute renal failure: Felt to be ATN from contrast exposure per Renal. Baseline SCr 0.83. Current SCr 2.37. Monitor for dialysis.  5. PAD: Status post bilateral amputation. Per vascular.   6. Stroke: Minimal conversation. Stable.  7. Anemia: Status post pRBC transfusion on 2/14 and 2/17. Maintain hgb >8.5.  8. Acute on chronic diastolic CHF: As above. Avoid beta blocker given his pulmonary function.   SignedChristell Faith, PA-C Pager: [phone] 12/15/2015, 7:52 AM

## 2015-12-15 NOTE — Progress Notes (Signed)
S:  Remains confused  O:  AF VSS       Lungs clear but obvious secretions in the oropharynx       Left leg dressing CD&I  A:  S/P left AKA post op day 3      S/P Acute MI with associated pulmonary and renal failure  P:  Continue supportive care       ? Swallow study       Chest X-Ray

## 2015-12-15 NOTE — Progress Notes (Signed)
Pt alert and oriented x2, no complaints of pain or discomfort.  Bed in low position, call bell within reach.  Bed alarms on and functioning.  Assessment done and charted.  Will continue to monitor and do hourly rounding throughout the shift 

## 2015-12-15 NOTE — Progress Notes (Signed)
Respiratory called for second option on pt breath sounds.  When assessed with respiratory sats were in mid 80's on RA and respiration rate was high.  Respiratory performed oral suctioning and it improved breath sounds.  Pt put on nasal canual at 5.  Breathing treatment administered.  MD paged to see if pt needs lasix or CXR. Jessee Avers

## 2015-12-15 NOTE — Progress Notes (Signed)
[facility]  ROUNDING NOTE   Subjective:  Confused this AM Able to answer a few questions Foley, rectal tube in place UOP 950 cc Stool 250cc Na level is higher at 147, S Cr slightly worse at 2.37 T max 99.4    Objective:  Vital signs in last 24 hours:  Temp:  [98 F (36.7 C)-99.4 F (37.4 C)] 98.6 F (37 C) (02/20 0900) Pulse Rate:  [85-96] 93 (02/20 0900) Resp:  [20-41] 20 (02/20 0900) BP: (133-149)/(70-84) 142/70 mmHg (02/20 0900) SpO2:  [94 %-99 %] 96 % (02/20 0900) Weight:  [72.712 kg (160 lb 4.8 oz)-72.802 kg (160 lb 8 oz)] 72.712 kg (160 lb 4.8 oz) (02/20 0500)  Weight change: 0.953 kg (2 lb 1.6 oz) Filed Weights   12/14/15 0300 12/14/15 1616 12/15/15 0500  Weight: 71.85 kg (158 lb 6.4 oz) 72.802 kg (160 lb 8 oz) 72.712 kg (160 lb 4.8 oz)    Intake/Output: I/O last 3 completed shifts: In: 3243.1 [I.V.:2303.1; Blood:390; IV Piggyback:550] Out: 2750 [Urine:2500; Stool:250]   Intake/Output this shift:     Physical Exam: General: Critically ill appearing  Head: moist oral mucosal membranes  E/ENT: anicteric  Neck: Right central line IJ  Lungs:  Bilateral diminished breath sounds  Heart: No rub  Abdomen:  Soft, nontender  Extremities:  Right AKA, left AKA (new), ++ dependent edema over extremities  Neurologic: Alert. confused  Skin: No lesions  Access:      Basic Metabolic Panel:  Recent Labs Lab 12/10/15 2331  12/11/15 2246 12/12/15 0501 12/13/15 0510 12/14/15 0213 12/15/15 0500  NA 142  < > 143 144 145 148* 147*  K 4.0  < > 3.6 4.0 3.8 3.3* 3.6  CL 109  < > 110 112* 111 112* 115*  CO2 28  < > '23 27 28 26 22  '$ GLUCOSE 167*  < > 264* 228* 298* 248* 172*  BUN 36*  < > 31* 31* 34* 36* 50*  CREATININE 1.99*  < > 1.93* 1.76* 1.74* 2.02* 2.37*  CALCIUM 7.7*  < > 7.5* 7.4* 7.3* 7.4* 7.3*  MG 2.0  --  2.0 2.0  --  1.9 2.0  PHOS 2.8  --   --  2.4* 1.7* 2.5 3.7  < > = values in this interval not displayed.  Liver Function  Tests:  Recent Labs Lab 12/09/15 0427  ALBUMIN 1.9*   No results for input(s): LIPASE, AMYLASE in the last 168 hours. No results for input(s): AMMONIA in the last 168 hours.  CBC:  Recent Labs Lab 12/09/15 2034 12/10/15 0816  12/12/15 1717 12/13/15 0510 12/13/15 1701 12/14/15 0213 12/15/15 0500  WBC 18.5* 18.6*  < > 17.7* 17.6* 19.5* 17.8* 19.1*  NEUTROABS 16.2* 16.6*  --   --   --   --   --   --   HGB 8.2* 8.7*  < > 7.5* 7.1* 6.8* 8.2* 8.1*  HCT 25.0* 26.6*  < > 23.4* 22.8* 21.2* 24.8* 24.4*  MCV 84.1 84.5  < > 85.4 87.3 85.0 86.6 84.4  PLT 86* 105*  < > 148* 174 187 188 213  < > = values in this interval not displayed.  Cardiac Enzymes:  Recent Labs Lab 12/13/15 2020 12/14/15 0213 12/14/15 0809  TROPONINI 2.03* 2.67* 3.00*    BNP: Invalid input(s): POCBNP  CBG:  Recent Labs Lab 12/14/15 2135 12/14/15 2343 12/15/15 0310 12/15/15 0538 12/15/15 0739  GLUCAP 159* 190* 146* 157* 165*    Microbiology: Results for  orders placed or performed during the hospital encounter of 12/05/15  Culture, respiratory (NON-Expectorated)     Status: None   Collection Time: 12/06/15  4:05 PM  Result Value Ref Range Status   Specimen Description NASAL SWAB  Final   Special Requests NONE  Final   Gram Stain RARE WBC SEEN FEW GRAM POSITIVE COCCI   Final   Culture HEAVY GROWTH STAPHYLOCOCCUS AUREUS  Final   Report Status 12/09/2015 FINAL  Final   Organism ID, Bacteria STAPHYLOCOCCUS AUREUS  Final      Susceptibility   Staphylococcus aureus - MIC*    CIPROFLOXACIN 2 INTERMEDIATE Intermediate     GENTAMICIN <=0.5 SENSITIVE Sensitive     OXACILLIN <=0.25 SENSITIVE Sensitive     TETRACYCLINE <=1 SENSITIVE Sensitive     VANCOMYCIN 1 SENSITIVE Sensitive     TRIMETH/SULFA <=10 SENSITIVE Sensitive     RIFAMPIN <=0.5 SENSITIVE Sensitive     Inducible Clindamycin NEGATIVE Sensitive     * HEAVY GROWTH STAPHYLOCOCCUS AUREUS  Culture, blood (Routine X 2) w Reflex to ID Panel      Status: None   Collection Time: 12/07/15 12:09 AM  Result Value Ref Range Status   Specimen Description BLOOD BLOOD RIGHT HAND  Final   Special Requests BOTTLES DRAWN AEROBIC AND ANAEROBIC 10CC  Final   Culture NO GROWTH 6 DAYS  Final   Report Status 12/13/2015 FINAL  Final  Culture, blood (Routine X 2) w Reflex to ID Panel     Status: None   Collection Time: 12/07/15 12:09 AM  Result Value Ref Range Status   Specimen Description BLOOD BLOOD LEFT HAND  Final   Special Requests BOTTLES DRAWN AEROBIC AND ANAEROBIC 10CC  Final   Culture NO GROWTH 6 DAYS  Final   Report Status 12/13/2015 FINAL  Final  Urine culture     Status: None   Collection Time: 12/07/15 12:11 AM  Result Value Ref Range Status   Specimen Description URINE, CLEAN CATCH  Final   Special Requests NONE  Final   Culture NO GROWTH 1 DAY  Final   Report Status 12/08/2015 FINAL  Final  MRSA PCR Screening     Status: None   Collection Time: 12/08/15  9:12 AM  Result Value Ref Range Status   MRSA by PCR NEGATIVE NEGATIVE Final    Comment:        The GeneXpert MRSA Assay (FDA approved for NASAL specimens only), is one component of a comprehensive MRSA colonization surveillance program. It is not intended to diagnose MRSA infection nor to guide or monitor treatment for MRSA infections.   C difficile quick scan w PCR reflex     Status: None   Collection Time: 12/11/15  1:54 PM  Result Value Ref Range Status   C Diff antigen NEGATIVE NEGATIVE Final   C Diff toxin NEGATIVE NEGATIVE Final   C Diff interpretation Negative for C. difficile  Final  Culture, blood (Routine X 2) w Reflex to ID Panel     Status: None (Preliminary result)   Collection Time: 12/13/15  9:33 AM  Result Value Ref Range Status   Specimen Description BLOOD LEFT ANTECUBITAL  Final   Special Requests BOTTLES DRAWN AEROBIC AND ANAEROBIC  4CC  Final   Culture NO GROWTH 2 DAYS  Final   Report Status PENDING  Incomplete  Culture, blood (Routine X 2)  w Reflex to ID Panel     Status: None (Preliminary result)   Collection Time: 12/13/15  9:41 AM  Result Value Ref Range Status   Specimen Description BLOOD RIGHT ARM  Final   Special Requests BOTTLES DRAWN AEROBIC AND ANAEROBIC  2CC  Final   Culture NO GROWTH 2 DAYS  Final   Report Status PENDING  Incomplete  Urine culture     Status: None   Collection Time: 12/13/15 11:01 AM  Result Value Ref Range Status   Specimen Description URINE, CATHETERIZED  Final   Special Requests NONE  Final   Culture NO GROWTH 1 DAY  Final   Report Status 12/14/2015 FINAL  Final  Culture, expectorated sputum-assessment     Status: None   Collection Time: 12/13/15 11:30 AM  Result Value Ref Range Status   Specimen Description TRACHEAL ASPIRATE  Final   Special Requests NONE  Final   Sputum evaluation THIS SPECIMEN IS ACCEPTABLE FOR SPUTUM CULTURE  Final   Report Status 12/13/2015 FINAL  Final  Culture, respiratory (NON-Expectorated)     Status: None (Preliminary result)   Collection Time: 12/13/15 11:30 AM  Result Value Ref Range Status   Specimen Description TRACHEAL ASPIRATE  Final   Special Requests NONE Reflexed from [id number]  Final   Gram Stain TOO YOUNG TO READ  Final   Culture TOO YOUNG TO READ  Final   Report Status PENDING  Incomplete  Cath Tip Culture     Status: None (Preliminary result)   Collection Time: 12/14/15 12:47 PM  Result Value Ref Range Status   Specimen Description CATH TIP  Final   Special Requests NONE  Final   Culture NO GROWTH < 24 HOURS  Final   Report Status PENDING  Incomplete    Coagulation Studies: No results for input(s): LABPROT, INR in the last 72 hours.  Urinalysis: No results for input(s): COLORURINE, LABSPEC, PHURINE, GLUCOSEU, HGBUR, BILIRUBINUR, KETONESUR, PROTEINUR, UROBILINOGEN, NITRITE, LEUKOCYTESUR in the last 72 hours.  Invalid input(s): APPERANCEUR    Imaging: Dg Abd 1 View  12/14/2015  CLINICAL DATA:  Ileus, recent lower extremity amputation.  EXAM: ABDOMEN - 1 VIEW COMPARISON:  12/13/2015 abdominal radiograph. FINDINGS: Right common femoral central venous catheter terminates over the IVC at the L3-4 disc level. Partially visualized is a vascular stent overlying the right inguinal region. Surgical clips overlie the lower deep pelvis. There is mild gaseous distention of the small and large bowel, improved in the interval. No evidence of pneumatosis, pneumoperitoneum or pathologic soft tissue calcifications. IMPRESSION: Improving mild adynamic ileus. Electronically Signed   By: Ilona Sorrel M.D.   On: 12/14/2015 09:11   Dg Chest Port 1 View  12/14/2015  CLINICAL DATA:  Acute respiratory failure.  Subsequent encounter. EXAM: PORTABLE CHEST 1 VIEW COMPARISON:  12/13/2015 FINDINGS: Changes from the previous median sternotomy are stable. There are pulmonary anastomosis staples in both apices, also stable. Cardiopericardial silhouette is mildly enlarged, also stable. Mild interstitial thickening is subtly improved. Left lung base opacity is stable most likely due to pleural fluid. No new lung abnormalities. No pneumothorax. Right internal jugular central venous line is stable and well positioned. IMPRESSION: 1. Slight improvement in presumed pulmonary edema. 2. No other change. Persistent left lung base opacity most likely a combination of pleural fluid and atelectasis. Electronically Signed   By: Lajean Manes M.D.   On: 12/14/2015 09:13   Dg Chest Port 1 View  12/13/2015  CLINICAL DATA:  Respiratory distress. History of right lung lobectomy for lung cancer. EXAM: PORTABLE CHEST 1 VIEW COMPARISON:  Chest radiograph from earlier today. FINDINGS: Right  internal jugular central venous catheter terminates at the cavoatrial junction. Sternotomy wires appear aligned and intact. Stable cardiomediastinal silhouette with mild cardiomegaly. No pneumothorax. Probable stable small left pleural effusion. Mild pulmonary edema, increased. Left lower lobe opacity,  stable, probably atelectasis. IMPRESSION: 1. Mild cardiomegaly with increased mild pulmonary edema, suggesting congestive heart failure. 2. Probable stable small left pleural effusion. 3. Left lower lobe opacity, stable, probably atelectasis. Electronically Signed   By: Ilona Sorrel M.D.   On: 12/13/2015 19:22     Medications:   . lactated ringers 50 mL/hr at 12/15/15 0555  . nitroGLYCERIN    . phenylephrine (NEO-SYNEPHRINE) Adult infusion Stopped (12/11/15 2300)   . amiodarone  150 mg Intravenous Once  . amiodarone  200 mg Oral BID  . antiseptic oral rinse  7 mL Mouth Rinse BID  . aspirin  150 mg Rectal Daily  . bisacodyl  10 mg Rectal Once  . heparin subcutaneous  5,000 Units Subcutaneous Q12H  . insulin aspart  0-15 Units Subcutaneous Q4H  . insulin glargine  5 Units Subcutaneous Daily  . insulin regular  8 Units Subcutaneous Once  . metoprolol  2.5 mg Intravenous 4 times per day  . pantoprazole sodium  40 mg Per Tube Daily  . polyethylene glycol  17 g Oral Daily  . senna-docusate  1 tablet Oral BID  . sennosides  5 mL Per Tube BID  . sodium chloride  500 mL Intravenous Once   acetaminophen, acetaminophen **OR** [DISCONTINUED] acetaminophen, albuterol, bisacodyl, fentaNYL (SUBLIMAZE) injection, hydrALAZINE, ipratropium-albuterol, labetalol, naLOXone (NARCAN)  injection, ondansetron **OR** ondansetron (ZOFRAN) IV, ondansetron (ZOFRAN) IV  Assessment/ Plan:  Mr. Stephen Camacho is a 70 y.o. black  male with hypertension, hyperlipidemia, diabetes mellitus type II, coronary artery disease status post 2 vessel CABG, peripheral arterial disease status post right above-the-knee amputation, seizure disorder, lung cancer, prostate cancer, history of CVA, who was admitted to [facility] on 12/05/2015 for ischemic left lower extremity. Hospital course complicated by acute renal failure, acute respiratory failure, new CVAs, myocardial infarction.  1. Acute renal failure:  ATN from contrast exposure.  nonoliguric urine output.  Baseline creatinine of 0.83. Electrolytes currently at goal.  - Cr had stabilized at 1.74, but started to worsen again since 2/18 ? Due to infection (WBC count worse, fever) - vascath removed - currently no acute indication of HD - monitor closely  2. Hypernatremia, Na high at 147 - IVF LR at 189m/hr - change to 1/2 NS with kcl    3. Ischemic left lower extremity: peripheral vascular disease: status post Left AKA on 2/17 Dr. SDelana Meyer  4. Anemia with renal failure: PRBC transfusion 2/14 and 2/17.   5. Acute respiratory failure: Status post intubation and on mechanical ventilation. With acute myocardial infarction and acute CVA. Now extubated.   6. Adynamic ileus     LOS: 10 Quenna Doepke 2/20/20179:38 AM

## 2015-12-15 NOTE — Progress Notes (Signed)
Pts CXR results called to Dr. Nino Parsley PA- no new orders given.

## 2015-12-15 NOTE — Evaluation (Signed)
Clinical/Bedside Swallow Evaluation Patient Details  Name: Stephen Camacho MRN: [id number] Date of Birth: May 03, 1946  Today's Date: 12/15/2015 Time: SLP Start Time (ACUTE ONLY): 59 SLP Stop Time (ACUTE ONLY): 1510 SLP Time Calculation (min) (ACUTE ONLY): 60 min  Past Medical History:  Past Medical History  Diagnosis Date  . Hyperlipidemia   . Hypertension   . Diabetes mellitus ([place])   . CAD (coronary artery disease)     a. s/p 2 v CABG in 2012 (LIMA-LAD & SVG-OM); b. cath 07/2012 s/p PCI/DES to ostial LCx and OM  . PAD (peripheral artery disease) ([place])     a. s/p right SFA stent; b. s/p right toe amputation 2011; c. s/p right AKA spring 2016  . Seizures ([place])   . Gangrene of foot ([place])   . Wheezing   . Lung cancer ([place])   . Prostate cancer ([place])   . Stroke [facility])    Past Surgical History:  Past Surgical History  Procedure Laterality Date  . Right aka  07-10-2014  . Left femoral popliteal bypass    . Right lung lobeectomy    . Coronary artery bypass graft    . Peripheral vascular catheterization Left 12/06/2015    Procedure: Lower Extremity Angiography;  Surgeon: [name], MD;  Location: [place] [facility];  Service: Cardiovascular;  Laterality: Left;  . Peripheral vascular catheterization Left 12/06/2015    Procedure: Lower Extremity Intervention;  Surgeon: [name], MD;  Location: [place] [facility];  Service: Cardiovascular;  Laterality: Left;  . Peripheral vascular catheterization N/A 12/05/2015    Procedure: Lower Extremity Angiography;  Surgeon: [name], MD;  Location: [place] [facility];  Service: Cardiovascular;  Laterality: N/A;  . Peripheral vascular catheterization  12/05/2015    Procedure: Lower Extremity Intervention;  Surgeon: [name], MD;  Location: [place] [facility];  Service: Cardiovascular;;  . Amputation Left 12/12/2015    Procedure: AMPUTATION ABOVE KNEE;  Surgeon: [name], MD;  Location: [facility];  Service:  Vascular;  Laterality: Left;   HPI:      Assessment / Plan / Recommendation Clinical Impression  Pt appears at increased risk for aspiration sec. to declined Cognitive status and decreased awareness of task of taking po's; poor awareness of the task of eating/drinking. When pt was fed and supported during the task(given verbal/tactile cues of straw and spoon to lips), pt consumed po trials of puree and Nectar w/ no overt s/s of aspiration noted; oral phase was c/b intermittent oral holding and decreased awareness for clearing w/ trials of puree. Pt exhibited delayed coughing w/ trials of thin liquids x1/3 trials. Pt presented w/ declined Cognitive attention and awareness exhibiting closed eyes w/ trials then mumbled speech about others in the room. Due to the increased risk for aspiration, rec. a Dys. 1 w/ Nectar liquids diet; aspiration precautions; feeding assistance at meals; meds in puree w/ NSG.     Aspiration Risk  Mild aspiration risk (-moderate risk)    Diet Recommendation  Dys. 1 w/ Nectar consistency liquids; strict aspiration precautions; feeding assistance at meals.  Medication Administration: Whole meds with puree (or crushed as nec./able)    Other  Recommendations Oral Care Recommendations: Oral care BID;Staff/trained caregiver to provide oral care Other Recommendations: Order thickener from pharmacy;Prohibited food (jello, ice cream, thin soups);Remove water pitcher   Follow up Recommendations  Skilled Nursing facility    Frequency and Duration min 3x week  2 weeks  Prognosis Prognosis for Safe Diet Advancement: Fair Barriers to Reach Goals: Cognitive deficits;Behavior      Swallow Study   General Date of Onset: 12/05/15 Type of Study: Bedside Swallow Evaluation Previous Swallow Assessment: none Diet Prior to this Study:  (clear liquid diet post surgery; L AKA) Temperature Spikes Noted: No (wbc elevated) Respiratory Status: Room air History of Recent  Intubation: Yes Date extubated: 12/13/15 Behavior/Cognition: Cooperative;Confused;Impulsive;Distractible;Requires cueing (awake) Oral Cavity Assessment: Dry Oral Care Completed by SLP: Yes Oral Cavity - Dentition: Missing dentition Vision:  (n/a) Self-Feeding Abilities: Total assist Patient Positioning: Upright in bed Baseline Vocal Quality: Low vocal intensity (mumbled speech) Volitional Cough: Cognitively unable to elicit Volitional Swallow: Unable to elicit    Oral/Motor/Sensory Function Overall Oral Motor/Sensory Function:  (poor follow through w/ OM tasks for full assessment)   Ice Chips Ice chips: Within functional limits Presentation: Spoon (fed; 3 trials) Other Comments: poor awareness of the po trials during oral intake   Thin Liquid Thin Liquid: Impaired Presentation: Cup (fed; 3 trials) Oral Phase Impairments: Reduced labial seal;Poor awareness of bolus Oral Phase Functional Implications:  (anterior spillage) Pharyngeal  Phase Impairments: Cough - Delayed (x1)    Nectar Thick Nectar Thick Liquid: Within functional limits Presentation: Straw (fed; 4 ozs) Other Comments: decreased awareness of task of taking po's   Honey Thick Honey Thick Liquid: Not tested   Puree Puree: Impaired Presentation: Spoon (fed; 9 trials) Oral Phase Impairments: Poor awareness of bolus Oral Phase Functional Implications: Oral residue;Oral holding (intermittent) Pharyngeal Phase Impairments:  (none) Other Comments: decreased awarness of taking po's   Solid   GO   Solid: Not tested       Orinda Kenner, MS, CCC-SLP  Watson,Katherine 12/15/2015,4:13 PM

## 2015-12-16 ENCOUNTER — Inpatient Hospital Stay: Payer: PPO

## 2015-12-16 DIAGNOSIS — T17908A Unspecified foreign body in respiratory tract, part unspecified causing other injury, initial encounter: Secondary | ICD-10-CM

## 2015-12-16 DIAGNOSIS — Z01818 Encounter for other preprocedural examination: Secondary | ICD-10-CM

## 2015-12-16 LAB — BASIC METABOLIC PANEL
Anion gap: 12 (ref 5–15)
BUN: 60 mg/dL — AB (ref 6–20)
CO2: 21 mmol/L — ABNORMAL LOW (ref 22–32)
CREATININE: 2.42 mg/dL — AB (ref 0.61–1.24)
Calcium: 7.4 mg/dL — ABNORMAL LOW (ref 8.9–10.3)
Chloride: 118 mmol/L — ABNORMAL HIGH (ref 101–111)
GFR calc Af Amer: 30 mL/min — ABNORMAL LOW (ref >60)
GFR, EST NON AFRICAN AMERICAN: 25 mL/min — AB (ref >60)
Glucose, Bld: 229 mg/dL — ABNORMAL HIGH (ref 65–99)
POTASSIUM: 3.5 mmol/L (ref 3.5–5.1)
SODIUM: 151 mmol/L — AB (ref 135–145)

## 2015-12-16 LAB — GLUCOSE, CAPILLARY
GLUCOSE-CAPILLARY: 211 mg/dL — AB (ref 65–99)
GLUCOSE-CAPILLARY: 225 mg/dL — AB (ref 65–99)
GLUCOSE-CAPILLARY: 209 mg/dL — AB (ref 65–99)
Glucose-Capillary: 154 mg/dL — ABNORMAL HIGH (ref 65–99)
Glucose-Capillary: 156 mg/dL — ABNORMAL HIGH (ref 65–99)
Glucose-Capillary: 167 mg/dL — ABNORMAL HIGH (ref 65–99)
Glucose-Capillary: 228 mg/dL — ABNORMAL HIGH (ref 65–99)

## 2015-12-16 LAB — BLOOD GAS, ARTERIAL
ACID-BASE EXCESS: 0.4 mmol/L (ref 0.0–3.0)
ACID-BASE EXCESS: 3.2 mmol/L — AB (ref 0.0–3.0)
ALLENS TEST (PASS/FAIL): POSITIVE — AB
ALLENS TEST (PASS/FAIL): POSITIVE — AB
Bicarbonate: 20.1 mEq/L — ABNORMAL LOW (ref 21.0–28.0)
Bicarbonate: 26.8 mEq/L (ref 21.0–28.0)
DELIVERY SYSTEMS: POSITIVE
Expiratory PAP: 6
FIO2: 0.35
FIO2: 0.4
Inspiratory PAP: 12
MECHANICAL RATE: 10
MECHVT: 500 mL
Mechanical Rate: 15
O2 Saturation: 96.1 %
O2 Saturation: 96.9 %
PCO2 ART: 21 mmHg — AB (ref 32.0–48.0)
PEEP: 5 cmH2O
PO2 ART: 83 mmHg (ref 83.0–108.0)
Patient temperature: 37
Patient temperature: 37
RATE: 15 resp/min
pCO2 arterial: 36 mmHg (ref 32.0–48.0)
pH, Arterial: 7.59 — ABNORMAL HIGH (ref 7.350–7.450)
pH, Arterial: 7.48 — ABNORMAL HIGH (ref 7.350–7.450)
pO2, Arterial: 68 mmHg — ABNORMAL LOW (ref 83.0–108.0)

## 2015-12-16 LAB — EXPECTORATED SPUTUM ASSESSMENT W REFEX TO RESP CULTURE

## 2015-12-16 LAB — CBC
HCT: 25.8 % — ABNORMAL LOW (ref 40.0–52.0)
Hemoglobin: 8.4 g/dL — ABNORMAL LOW (ref 13.0–18.0)
MCH: 28.2 pg (ref 26.0–34.0)
MCHC: 32.5 g/dL (ref 32.0–36.0)
MCV: 86.7 fL (ref 80.0–100.0)
PLATELETS: 204 10*3/uL (ref 150–440)
RBC: 2.97 MIL/uL — AB (ref 4.40–5.90)
RDW: 15.9 % — ABNORMAL HIGH (ref 11.5–14.5)
WBC: 28.5 10*3/uL — ABNORMAL HIGH (ref 3.8–10.6)

## 2015-12-16 LAB — TYPE AND SCREEN
ABO/RH(D): B POS
ANTIBODY SCREEN: NEGATIVE
UNIT DIVISION: 0
UNIT DIVISION: 0
Unit division: 0

## 2015-12-16 LAB — SURGICAL PATHOLOGY

## 2015-12-16 LAB — C DIFFICILE QUICK SCREEN W PCR REFLEX
C DIFFICILE (CDIFF) INTERP: NEGATIVE
C DIFFICLE (CDIFF) ANTIGEN: NEGATIVE
C Diff toxin: NEGATIVE

## 2015-12-16 LAB — LACTIC ACID, PLASMA: LACTIC ACID, VENOUS: 2.5 mmol/L — AB (ref 0.5–2.0)

## 2015-12-16 LAB — MAGNESIUM: MAGNESIUM: 2.2 mg/dL (ref 1.7–2.4)

## 2015-12-16 LAB — PROCALCITONIN: Procalcitonin: 3.52 ng/mL

## 2015-12-16 LAB — TRIGLYCERIDES: Triglycerides: 110 mg/dL (ref <150)

## 2015-12-16 LAB — TROPONIN I: Troponin I: 2.7 ng/mL — ABNORMAL HIGH (ref <0.031)

## 2015-12-16 MED ORDER — CHLORHEXIDINE GLUCONATE 0.12% ORAL RINSE (MEDLINE KIT)
15.0000 mL | Freq: Two times a day (BID) | OROMUCOSAL | Status: DC
  Administered 2015-12-16 (×2): 15 mL via OROMUCOSAL
  Filled 2015-12-16 (×4): qty 15

## 2015-12-16 MED ORDER — FREE WATER
200.0000 mL | Freq: Three times a day (TID) | Status: DC
  Administered 2015-12-16 – 2015-12-17 (×3): 200 mL

## 2015-12-16 MED ORDER — PROPOFOL 1000 MG/100ML IV EMUL
5.0000 ug/kg/min | INTRAVENOUS | Status: DC
  Administered 2015-12-16: 45 ug/kg/min via INTRAVENOUS
  Administered 2015-12-16: 50 ug/kg/min via INTRAVENOUS
  Administered 2015-12-16: 30 ug/kg/min via INTRAVENOUS
  Administered 2015-12-17: 50 ug/kg/min via INTRAVENOUS
  Administered 2015-12-17 (×3): 45 ug/kg/min via INTRAVENOUS
  Administered 2015-12-17 – 2015-12-18 (×2): 50 ug/kg/min via INTRAVENOUS
  Administered 2015-12-18: 30 ug/kg/min via INTRAVENOUS
  Administered 2015-12-18: 50 ug/kg/min via INTRAVENOUS
  Administered 2015-12-18 – 2015-12-19 (×4): 40 ug/kg/min via INTRAVENOUS
  Administered 2015-12-19: 30 ug/kg/min via INTRAVENOUS
  Administered 2015-12-20 (×2): 40 ug/kg/min via INTRAVENOUS
  Filled 2015-12-16 (×17): qty 100

## 2015-12-16 MED ORDER — MIDAZOLAM HCL 2 MG/2ML IJ SOLN
2.0000 mg | Freq: Once | INTRAMUSCULAR | Status: AC
  Administered 2015-12-16: 2 mg via INTRAVENOUS

## 2015-12-16 MED ORDER — DEXTROSE 5 % IV SOLN
1.0000 g | Freq: Once | INTRAVENOUS | Status: DC
  Filled 2015-12-16: qty 1

## 2015-12-16 MED ORDER — VITAL HIGH PROTEIN PO LIQD
1000.0000 mL | ORAL | Status: DC
  Administered 2015-12-16: 1000 mL

## 2015-12-16 MED ORDER — DEXTROSE 5 % IV SOLN
1.0000 g | INTRAVENOUS | Status: DC
  Administered 2015-12-17 – 2015-12-18 (×2): 1 g via INTRAVENOUS
  Filled 2015-12-16 (×3): qty 1

## 2015-12-16 MED ORDER — FENTANYL CITRATE (PF) 100 MCG/2ML IJ SOLN
50.0000 ug | Freq: Once | INTRAMUSCULAR | Status: AC
  Administered 2015-12-16: 50 ug via INTRAVENOUS

## 2015-12-16 MED ORDER — VANCOMYCIN HCL 10 G IV SOLR
1250.0000 mg | INTRAVENOUS | Status: DC
  Administered 2015-12-17 – 2015-12-18 (×2): 1250 mg via INTRAVENOUS
  Filled 2015-12-16 (×2): qty 1250

## 2015-12-16 MED ORDER — SUCCINYLCHOLINE CHLORIDE 20 MG/ML IJ SOLN
20.0000 mg | Freq: Once | INTRAMUSCULAR | Status: AC
  Administered 2015-12-16: 20 mg via INTRAVENOUS
  Filled 2015-12-16: qty 1

## 2015-12-16 MED ORDER — FUROSEMIDE 10 MG/ML IJ SOLN
60.0000 mg | Freq: Once | INTRAMUSCULAR | Status: AC
  Administered 2015-12-16: 60 mg via INTRAVENOUS
  Filled 2015-12-16: qty 6

## 2015-12-16 MED ORDER — INSULIN GLARGINE 100 UNIT/ML ~~LOC~~ SOLN
11.0000 [IU] | Freq: Every day | SUBCUTANEOUS | Status: DC
  Administered 2015-12-17: 11 [IU] via SUBCUTANEOUS
  Filled 2015-12-16: qty 0.11

## 2015-12-16 MED ORDER — MIDAZOLAM HCL 2 MG/2ML IJ SOLN
INTRAMUSCULAR | Status: AC
  Administered 2015-12-16: 2 mg via INTRAVENOUS
  Filled 2015-12-16: qty 4

## 2015-12-16 MED ORDER — CETYLPYRIDINIUM CHLORIDE 0.05 % MT LIQD
7.0000 mL | Freq: Four times a day (QID) | OROMUCOSAL | Status: DC
  Administered 2015-12-16 – 2015-12-17 (×3): 7 mL via OROMUCOSAL

## 2015-12-16 MED ORDER — VANCOMYCIN HCL 10 G IV SOLR
1250.0000 mg | Freq: Once | INTRAVENOUS | Status: DC
  Filled 2015-12-16: qty 1250

## 2015-12-16 MED ORDER — FUROSEMIDE 10 MG/ML IJ SOLN
40.0000 mg | Freq: Once | INTRAMUSCULAR | Status: AC
  Administered 2015-12-16: 40 mg via INTRAVENOUS
  Filled 2015-12-16: qty 4

## 2015-12-16 MED ORDER — ACETAMINOPHEN 10 MG/ML IV SOLN
1000.0000 mg | Freq: Once | INTRAVENOUS | Status: AC
  Administered 2015-12-16: 1000 mg via INTRAVENOUS
  Filled 2015-12-16: qty 100

## 2015-12-16 MED ORDER — PROPOFOL 1000 MG/100ML IV EMUL
INTRAVENOUS | Status: AC
  Administered 2015-12-16: 30 ug/kg/min via INTRAVENOUS
  Filled 2015-12-16: qty 100

## 2015-12-16 MED ORDER — FENTANYL CITRATE (PF) 100 MCG/2ML IJ SOLN
INTRAMUSCULAR | Status: AC
  Administered 2015-12-16: 50 ug via INTRAVENOUS
  Filled 2015-12-16: qty 2

## 2015-12-16 NOTE — Progress Notes (Signed)
Inpatient Diabetes Program Recommendations  AACE/ADA: New Consensus Statement on Inpatient Glycemic Control (2015)  Target Ranges:  Prepandial:   less than 140 mg/dL      Peak postprandial:   less than 180 mg/dL (1-2 hours)      Critically ill patients:  140 - 180 mg/dL   Review of Glycemic Control  Results for CHAS, AXEL (MRN [id number]) as of 12/16/2015 11:03  Ref. Range 12/15/2015 21:11 12/16/2015 00:05 12/16/2015 00:50 12/16/2015 03:51 12/16/2015 07:23  Glucose-Capillary Latest Ref Range: 65-99 mg/dL 121 (H) 156 (H) 167 (H) 211 (H) 225 (H)   Diabetes history: DM2 Outpatient Diabetes medications: Lantus 10 units QHS, Metformin 500 mg BID Current orders for Inpatient glycemic control: Novolog 0-15 units Q4H, Lantus 5 units qday  Inpatient Diabetes Program Recommendations: elevated cbg, fasting blood sugar '225mg'$ /dl - consider increasing Lantus to 11 units qday (0.15units/kg)  Gentry Fitz, RN, BA, [place], CDE Diabetes Coordinator Inpatient Diabetes Program  [phone] (Team Pager) [phone] ([place]) 12/16/2015 11:09 AM

## 2015-12-16 NOTE — Progress Notes (Signed)
PULMONARY / CRITICAL CARE MEDICINE   Name: Stephen Camacho MRN: [id number] DOB: December 20, 1945    ADMISSION DATE:  12/05/2015    70 YO male with acute complete occlusion of the  left fem-pop bypass graft s/p thrombolysis/thrombectomy/percuteneous transluminal angioplasty with residual thrombosis, sepsis secondary to possible necrosis from left leg thrombosis, and  acute hypoxic respiratory failure secondary to sepsis and complicated by acute NSTEMI and worsening renal function-Now improved without need for CRRT. OR 02/18 for left AKA ; fever and hypotension post-op; now improved and extubated.   ASSESSMENT / PLAN:  PULMONARY A: Acute hypoxic respiratory failure Suspect pneumonia with sepsis. Review of most recent chest x-ray films shows bilateral diffuse infiltrates suspicious for pneumonia.  Intubated 2/11>>2/18  P:   -Extubated 02/18, respiratory status , worsening, with leukocytosis. -Continue broad-spectrum antibiotics, recheck blood and urine cultures, may require reintubation.  CARDIOVASCULAR-NSTEMI A:  Status post NSTEMI Afib Short runs of V-tach P:  -Continue amiodarone maintenance dose. -Cardiology following - rec low dose ASA -When necessary, metoprolol, aspirin suppository.  RENAL A:   AKI secondary procedural IV dye and volume depletion; baseline creatinine 0.83 P:   -IVF, advance diet.  -Trend creatinine - currently improving -Renally dose meds -nephro following - vascath placed removed 02/19.   GASTROINTESTINAL A:   Ileus P:   -PPI for GI prophylaxis -Monitor BMs -Continue bowel regimen.  -Passed bedside swallow screen -Advance diet as tolerated  HEMATOLOGIC A:   Left leg ischemia-Complete Occlusion of the left fem-pop bypass graft s/p thrombolysis/PTA with residual thrombosis Right neck hematoma s/p TLC placement-improved Severe PVD s/p Right AKA Acute blood loss Anemia improved post-transfusion P:  -Vascular surgery following transfused I unit  of PRBCs for a Hg of 6.8 -Monitor for bleeding-transfuse as needed,  INFECTIOUS A:   Septic shock-likely source is left leg ischemia/necrosis from massive thrombus-Resolving Fever P:   -Antibiotics as above -F/U cultures of blood, urine.  ENDOCRINE A:   Type 2 diabetes mellitus   P:   -Blood glucose monitoring with SSI coverage -Hold oral hypoglycemic agents, continue long-acting insulin plus sliding scale coverage.  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 H/O Seizures-Not on home seizure meds  Acute post-op pain Encephalopathy secondary to hypercapnia.   P:      STUDIES:  02/12: 2-D FTDD>22-02%; grade 1 diastolic dysfunction, LVH  CULTURES: 02/11>Blood>NTD 02/11>Sputum>STAPHYLOCOCCUS AUREUS  02/18 Blood x2>> negative 02/18 Urine>> negative 02/18 Respiratory>> yeast 2/20, catheter tip culture >> negative thus far 2/21. Urine culture>>  ANTIBIOTICS: Piperacillin-tazobactam 02/11> Ceftaz 2/21>>  SIGNIFICANT EVENTS: 02/07>ED with SOB and left calf pain>treated and released 02/09>ED with cold left foot and calf pain>CT angio>complete occlusion of the left fem-pop bypass graft>admitted 02/11>OR for angiogram with thrombolysis, thrombectomy of left peroneal artery, tibioperoneal trunk, popliteal artery, and femoral to popliteal bypass graft and percutaneous transluminal angioplasty of the left peroneal artery and distal bypass anastomosis and popliteal artery Post-op: residual thrombosis of femoral to popliteal bypass graft and all tibial vessels; acute respiratory failure>Intubated  ------------------------------------------   SUBJECTIVE:  The patient was transferred to the floor status on 2/19. However, he was required BiPAP overnight, and his sats dropped, with continued fevers and leukocytosis.  VITAL SIGNS: BP 147/81 mmHg  Pulse  93  Temp(Src) 99.8 F (37.7 C) (Axillary)  Resp 34  Ht '6\' 4"'$  (1.93 m)  Wt 158 lb 11.7 oz (72 kg)  BMI 19.33 kg/m2  SpO2 100%  HEMODYNAMICS:    VENTILATOR SETTINGS: Vent Mode:  [-]  FiO2 (%):  [35 %-45 %] 35 %  INTAKE / OUTPUT: I/O last 3 completed shifts: In: 340 [P.O.:240; IV Piggyback:100] Out: 1287 [Urine:3075; Stool:100]  PHYSICAL EXAMINATION: General: Frail looking male, NAD Neuro: Moves upper extremities to command HEENT: PERRLA, oral mucosa pink Cardiovascular: Afib, no MRG Lungs:  bilateral airflow, scattered rhonchi/rales in all lung fields. Decreased air entry bilaterally. Abdomen: Soft, distended; hypoactive bowl sounds Musculoskeletal:  Bilateral AKA; left stump with dressing Extremities: +femoral pulse Skin: Warm; no rash  LABS:  BMET  Recent Labs Lab 12/14/15 0213 12/15/15 0500 12/16/15 0440  NA 148* 147* 151*  K 3.3* 3.6 3.5  CL 112* 115* 118*  CO2 26 22 21*  BUN 36* 50* 60*  CREATININE 2.02* 2.37* 2.42*  GLUCOSE 248* 172* 229*    Electrolytes  Recent Labs Lab 12/13/15 0510 12/14/15 0213 12/15/15 0500 12/16/15 0440  CALCIUM 7.3* 7.4* 7.3* 7.4*  MG  --  1.9 2.0 2.2  PHOS 1.7* 2.5 3.7  --     CBC  Recent Labs Lab 12/14/15 0213 12/15/15 0500 12/16/15 0440  WBC 17.8* 19.1* 28.5*  HGB 8.2* 8.1* 8.4*  HCT 24.8* 24.4* 25.8*  PLT 188 213 204    Coag's No results for input(s): APTT, INR in the last 168 hours.  Sepsis Markers  Recent Labs Lab 12/16/15 0519  LATICACIDVEN 2.5*  PROCALCITON 3.52    ABG  Recent Labs Lab 12/13/15 1846 12/16/15 0400  PHART 7.66* 7.59*  PCO2ART 19* 21*  PO2ART 56* 68*    Liver Enzymes No results for input(s): AST, ALT, ALKPHOS, BILITOT, ALBUMIN in the last 168 hours.  Cardiac Enzymes  Recent Labs Lab 12/14/15 0213 12/14/15 0809 12/16/15 0440  TROPONINI 2.67* 3.00* 2.70*    Glucose  Recent Labs Lab 12/15/15 1609 12/15/15 2111 12/16/15 0005 12/16/15 0050 12/16/15 0351  12/16/15 0723  GLUCAP 146* 121* 156* 167* 211* 225*    Imaging Dg Chest Port 1 View  12/15/2015  CLINICAL DATA:  Aspiration into respiratory tract. EXAM: PORTABLE CHEST 1 VIEW COMPARISON:  Radiograph yesterday at 0542 hour FINDINGS: Tip of the right central line in the distal SVC. Patient is post median sternotomy. Cardiomegaly is unchanged. Worsening perihilar pulmonary edema from prior exam. Obscuration of left hemidiaphragm, with new right basilar opacity. Chain sutures in the right lung apex. Skin fold projects over the right hemithorax. IMPRESSION: 1. Increasing pulmonary edema.  Unchanged cardiomegaly. 2. Increasing bibasilar opacities, can be seen in the setting of aspiration. Alternatively this could be vascular. Electronically Signed   By: Jeb Levering M.D.   On: 12/15/2015 23:03      Deep Ashby Dawes, M.D.   Critical Care Attestation.  I have personally obtained a history, examined the patient, evaluated laboratory and imaging results, formulated the assessment and plan and placed orders. The Patient requires high complexity decision making for assessment and support, frequent evaluation and titration of therapies, application of advanced monitoring technologies and extensive interpretation of multiple databases. The patient has critical illness that could lead imminently to failure of 1 or more organ systems and requires the highest level of physician preparedness to intervene.  Critical Care Time devoted to patient care services described in this note is 35 minutes and is exclusive of time spent in procedures.       Marda Stalker, M.D.

## 2015-12-16 NOTE — Progress Notes (Signed)
Patient on Bipap 45% with O2 sats 98-99%.  Lungs sound diminished.  Patient remains tachypnic, breathing 45-50 times per minute.  Paged Marcelle Overlie, PA for Dr. Delana Meyer regarding patient's respirations.  Advised to speak to [facility] MD regarding patient's respirations.  No new orders at this time; will continue to monitor.

## 2015-12-16 NOTE — Procedures (Signed)
Endotracheal Intubation: Patient required placement of an artificial airway secondary to resp failure.   Consent: Emergent.   Hand washing performed prior to starting the procedure.   Medications administered for sedation prior to procedure: Midazolam 4 mg IV,  succinylcholine, 20 mg mg IV, Fentanyl 50 mcg IV.   Procedure: A time out procedure was called and correct patient, name, & ID confirmed. Needed supplies and equipment were assembled and checked to include ETT, 10 ml syringe, Glidescope, Mac and Miller blades, suction, oxygen and bag mask valve, end tidal CO2 monitor. Patient was positioned to align the mouth and pharynx to facilitate visualization of the glottis.  Heart rate, SpO2 and blood pressure was continuously monitored during the procedure. Pre-oxygenation was conducted prior to intubation and endotracheal tube was placed through the vocal cords into the trachea.     The artificial airway was placed under direct visualization via glidescope route using a 8 ETT on the first attempt.    ETT was secured at 24 cm mark.    Placement was confirmed by auscuitation of lungs with good breath sounds bilaterally and no stomach sounds.  Condensation was noted on endotracheal tube.  Pulse ox %.  CO2 detector in place with appropriate color change.   Complications: None .   Operator: Rutherford Alarie,M.D.   Chest radiograph ordered and pending.     Marda Stalker, M.D.

## 2015-12-16 NOTE — Progress Notes (Signed)
[facility]  ROUNDING NOTE   Subjective:  Overnight, he was transferred to ICU for resp distress Currently on biPAP Given lasix 60 mg iv x 1 at 5.40 am,, with good response. (patient was getting LR 100 cc/hr yesterday AM) Foley, rectal tube in place UOP 2475 cc  Na level is higher at 151, S Cr slightly worse at 2.42 T max 102.4    Objective:  Vital signs in last 24 hours:  Temp:  [97.6 F (36.4 C)-102.4 F (39.1 C)] 99.8 F (37.7 C) (02/21 0727) Pulse Rate:  [92-105] 93 (02/21 0800) Resp:  [18-48] 34 (02/21 0800) BP: (135-154)/(52-87) 147/81 mmHg (02/21 0800) SpO2:  [86 %-100 %] 100 % (02/21 0800) FiO2 (%):  [35 %-45 %] 35 % (02/21 0700) Weight:  [72 kg (158 lb 11.7 oz)] 72 kg (158 lb 11.7 oz) (02/21 0359)  Weight change: -0.802 kg (-1 lb 12.3 oz) Filed Weights   12/14/15 1616 12/15/15 0500 12/16/15 0359  Weight: 72.802 kg (160 lb 8 oz) 72.712 kg (160 lb 4.8 oz) 72 kg (158 lb 11.7 oz)    Intake/Output: I/O last 3 completed shifts: In: 340 [P.O.:240; IV Piggyback:100] Out: 3300 [Urine:3075; Stool:100]   Intake/Output this shift:  Total I/O In: -  Out: 225 [Urine:225]  Physical Exam: General: Critically ill appearing  Head: moist oral mucosal membranes  E/ENT: BiPAP mask in place  Neck: Right central line IJ  Lungs:  Bilateral diffuse crackles, tachypneic  Heart: No rub, tachycardic  Abdomen:  Soft, nontender  Extremities:  Right AKA, left AKA (new), ++ dependent edema over extremities  Neurologic: Able to nod yes/no to few simple questions  Skin: No lesions  Access:      Basic Metabolic Panel:  Recent Labs Lab 12/10/15 2331  12/11/15 2246 12/12/15 0501 12/13/15 0510 12/14/15 0213 12/15/15 0500 12/16/15 0440  NA 142  < > 143 144 145 148* 147* 151*  K 4.0  < > 3.6 4.0 3.8 3.3* 3.6 3.5  CL 109  < > 110 112* 111 112* 115* 118*  CO2 28  < > '23 27 28 26 22 '$ 21*  GLUCOSE 167*  < > 264* 228* 298* 248* 172* 229*  BUN 36*  < > 31* 31* 34* 36*  50* 60*  CREATININE 1.99*  < > 1.93* 1.76* 1.74* 2.02* 2.37* 2.42*  CALCIUM 7.7*  < > 7.5* 7.4* 7.3* 7.4* 7.3* 7.4*  MG 2.0  --  2.0 2.0  --  1.9 2.0 2.2  PHOS 2.8  --   --  2.4* 1.7* 2.5 3.7  --   < > = values in this interval not displayed.  Liver Function Tests: No results for input(s): AST, ALT, ALKPHOS, BILITOT, PROT, ALBUMIN in the last 168 hours. No results for input(s): LIPASE, AMYLASE in the last 168 hours. No results for input(s): AMMONIA in the last 168 hours.  CBC:  Recent Labs Lab 12/09/15 2034 12/10/15 0816  12/13/15 0510 12/13/15 1701 12/14/15 0213 12/15/15 0500 12/16/15 0440  WBC 18.5* 18.6*  < > 17.6* 19.5* 17.8* 19.1* 28.5*  NEUTROABS 16.2* 16.6*  --   --   --   --   --   --   HGB 8.2* 8.7*  < > 7.1* 6.8* 8.2* 8.1* 8.4*  HCT 25.0* 26.6*  < > 22.8* 21.2* 24.8* 24.4* 25.8*  MCV 84.1 84.5  < > 87.3 85.0 86.6 84.4 86.7  PLT 86* 105*  < > 174 187 188 213 204  < > =  values in this interval not displayed.  Cardiac Enzymes:  Recent Labs Lab 12/13/15 2020 12/14/15 0213 12/14/15 0809 12/16/15 0440  TROPONINI 2.03* 2.67* 3.00* 2.70*    BNP: Invalid input(s): POCBNP  CBG:  Recent Labs Lab 12/15/15 2111 12/16/15 0005 12/16/15 0050 12/16/15 0351 12/16/15 0723  GLUCAP 121* 156* 167* 211* 225*    Microbiology: Results for orders placed or performed during the hospital encounter of 12/05/15  Culture, respiratory (NON-Expectorated)     Status: None   Collection Time: 12/06/15  4:05 PM  Result Value Ref Range Status   Specimen Description NASAL SWAB  Final   Special Requests NONE  Final   Gram Stain RARE WBC SEEN FEW GRAM POSITIVE COCCI   Final   Culture HEAVY GROWTH STAPHYLOCOCCUS AUREUS  Final   Report Status 12/09/2015 FINAL  Final   Organism ID, Bacteria STAPHYLOCOCCUS AUREUS  Final      Susceptibility   Staphylococcus aureus - MIC*    CIPROFLOXACIN 2 INTERMEDIATE Intermediate     GENTAMICIN <=0.5 SENSITIVE Sensitive     OXACILLIN <=0.25  SENSITIVE Sensitive     TETRACYCLINE <=1 SENSITIVE Sensitive     VANCOMYCIN 1 SENSITIVE Sensitive     TRIMETH/SULFA <=10 SENSITIVE Sensitive     RIFAMPIN <=0.5 SENSITIVE Sensitive     Inducible Clindamycin NEGATIVE Sensitive     * HEAVY GROWTH STAPHYLOCOCCUS AUREUS  Culture, blood (Routine X 2) w Reflex to ID Panel     Status: None   Collection Time: 12/07/15 12:09 AM  Result Value Ref Range Status   Specimen Description BLOOD BLOOD RIGHT HAND  Final   Special Requests BOTTLES DRAWN AEROBIC AND ANAEROBIC 10CC  Final   Culture NO GROWTH 6 DAYS  Final   Report Status 12/13/2015 FINAL  Final  Culture, blood (Routine X 2) w Reflex to ID Panel     Status: None   Collection Time: 12/07/15 12:09 AM  Result Value Ref Range Status   Specimen Description BLOOD BLOOD LEFT HAND  Final   Special Requests BOTTLES DRAWN AEROBIC AND ANAEROBIC 10CC  Final   Culture NO GROWTH 6 DAYS  Final   Report Status 12/13/2015 FINAL  Final  Urine culture     Status: None   Collection Time: 12/07/15 12:11 AM  Result Value Ref Range Status   Specimen Description URINE, CLEAN CATCH  Final   Special Requests NONE  Final   Culture NO GROWTH 1 DAY  Final   Report Status 12/08/2015 FINAL  Final  MRSA PCR Screening     Status: None   Collection Time: 12/08/15  9:12 AM  Result Value Ref Range Status   MRSA by PCR NEGATIVE NEGATIVE Final    Comment:        The GeneXpert MRSA Assay (FDA approved for NASAL specimens only), is one component of a comprehensive MRSA colonization surveillance program. It is not intended to diagnose MRSA infection nor to guide or monitor treatment for MRSA infections.   C difficile quick scan w PCR reflex     Status: None   Collection Time: 12/11/15  1:54 PM  Result Value Ref Range Status   C Diff antigen NEGATIVE NEGATIVE Final   C Diff toxin NEGATIVE NEGATIVE Final   C Diff interpretation Negative for C. difficile  Final  Culture, blood (Routine X 2) w Reflex to ID Panel      Status: None (Preliminary result)   Collection Time: 12/13/15  9:33 AM  Result Value Ref Range Status  Specimen Description BLOOD LEFT ANTECUBITAL  Final   Special Requests BOTTLES DRAWN AEROBIC AND ANAEROBIC  4CC  Final   Culture NO GROWTH 3 DAYS  Final   Report Status PENDING  Incomplete  Culture, blood (Routine X 2) w Reflex to ID Panel     Status: None (Preliminary result)   Collection Time: 12/13/15  9:41 AM  Result Value Ref Range Status   Specimen Description BLOOD RIGHT ARM  Final   Special Requests BOTTLES DRAWN AEROBIC AND ANAEROBIC  2CC  Final   Culture NO GROWTH 3 DAYS  Final   Report Status PENDING  Incomplete  Urine culture     Status: None   Collection Time: 12/13/15 11:01 AM  Result Value Ref Range Status   Specimen Description URINE, CATHETERIZED  Final   Special Requests NONE  Final   Culture NO GROWTH 1 DAY  Final   Report Status 12/14/2015 FINAL  Final  Culture, expectorated sputum-assessment     Status: None   Collection Time: 12/13/15 11:30 AM  Result Value Ref Range Status   Specimen Description TRACHEAL ASPIRATE  Final   Special Requests NONE  Final   Sputum evaluation THIS SPECIMEN IS ACCEPTABLE FOR SPUTUM CULTURE  Final   Report Status 12/13/2015 FINAL  Final  Culture, respiratory (NON-Expectorated)     Status: None (Preliminary result)   Collection Time: 12/13/15 11:30 AM  Result Value Ref Range Status   Specimen Description TRACHEAL ASPIRATE  Final   Special Requests NONE Reflexed from [id number]  Final   Gram Stain TOO YOUNG TO READ  Final   Culture LIGHT GROWTH YEAST IDENTIFICATION TO FOLLOW   Final   Report Status PENDING  Incomplete  Cath Tip Culture     Status: None (Preliminary result)   Collection Time: 12/14/15 12:47 PM  Result Value Ref Range Status   Specimen Description CATH TIP  Final   Special Requests NONE  Final   Culture NO GROWTH < 24 HOURS  Final   Report Status PENDING  Incomplete    Coagulation Studies: No results for  input(s): LABPROT, INR in the last 72 hours.  Urinalysis: No results for input(s): COLORURINE, LABSPEC, PHURINE, GLUCOSEU, HGBUR, BILIRUBINUR, KETONESUR, PROTEINUR, UROBILINOGEN, NITRITE, LEUKOCYTESUR in the last 72 hours.  Invalid input(s): APPERANCEUR    Imaging: Dg Chest Port 1 View  12/15/2015  CLINICAL DATA:  Aspiration into respiratory tract. EXAM: PORTABLE CHEST 1 VIEW COMPARISON:  Radiograph yesterday at 0542 hour FINDINGS: Tip of the right central line in the distal SVC. Patient is post median sternotomy. Cardiomegaly is unchanged. Worsening perihilar pulmonary edema from prior exam. Obscuration of left hemidiaphragm, with new right basilar opacity. Chain sutures in the right lung apex. Skin fold projects over the right hemithorax. IMPRESSION: 1. Increasing pulmonary edema.  Unchanged cardiomegaly. 2. Increasing bibasilar opacities, can be seen in the setting of aspiration. Alternatively this could be vascular. Electronically Signed   By: Jeb Levering M.D.   On: 12/15/2015 23:03     Medications:   . nitroGLYCERIN    . phenylephrine (NEO-SYNEPHRINE) Adult infusion Stopped (12/11/15 2300)   . amiodarone  150 mg Intravenous Once  . amiodarone  200 mg Oral BID  . antiseptic oral rinse  7 mL Mouth Rinse BID  . aspirin  150 mg Rectal Daily  . bisacodyl  10 mg Rectal Once  . cefTAZidime (FORTAZ)  IV  1 g Intravenous Once  . [START ON 12/17/2015] cefTAZidime (FORTAZ)  IV  1 g Intravenous Q24H  .  furosemide  40 mg Intravenous Once  . heparin subcutaneous  5,000 Units Subcutaneous Q12H  . insulin aspart  0-15 Units Subcutaneous Q4H  . insulin glargine  5 Units Subcutaneous Daily  . insulin regular  8 Units Subcutaneous Once  . metoprolol  2.5 mg Intravenous 4 times per day  . pantoprazole sodium  40 mg Per Tube Daily  . polyethylene glycol  17 g Oral Daily  . senna-docusate  1 tablet Oral BID  . sennosides  5 mL Per Tube BID  . sodium chloride  500 mL Intravenous Once  .  vancomycin  1,250 mg Intravenous Once  . [START ON 12/17/2015] vancomycin  1,250 mg Intravenous Q36H   acetaminophen, acetaminophen **OR** [DISCONTINUED] acetaminophen, albuterol, bisacodyl, fentaNYL (SUBLIMAZE) injection, hydrALAZINE, ipratropium-albuterol, labetalol, morphine injection, naLOXone (NARCAN)  injection, ondansetron **OR** ondansetron (ZOFRAN) IV, ondansetron (ZOFRAN) IV  Assessment/ Plan:  Mr. Izen Petz is a 70 y.o. black  male with hypertension, hyperlipidemia, diabetes mellitus type II, coronary artery disease status post 2 vessel CABG, peripheral arterial disease status post right above-the-knee amputation, seizure disorder, lung cancer, prostate cancer, history of CVA, who was admitted to [facility] on 12/05/2015 for ischemic left lower extremity. Hospital course complicated by acute renal failure, acute respiratory failure, new CVAs, myocardial infarction.  1. Acute renal failure:  ATN from contrast exposure. nonoliguric urine output.  Baseline creatinine of 0.83.  - Cr had stabilized at 1.74, but started to worsen again since 2/18  - ? Due to infection (WBC count worse, fever), likely aspiration - vascath removed - currently no acute indication of HD    2. Hypernatremia, Na high at 151 - after resp status is stabilized, would start D5W at 50-75 cc/hr with KCl to correct hypernatremia    3. Ischemic left lower extremity: peripheral vascular disease: status post Left AKA on 2/17 Dr. Delana Meyer   4. Acute respiratory failure: Status post intubation and on mechanical ventilation. With acute myocardial infarction and acute CVA.  - probably combination of acute pulm edema and aspiration pneumonia - may need reintubation - ICU team to follow up - repat dose of lasix 40 mg iv x 1  5. Anemia with renal failure: PRBC transfusion 2/14 and 2/17.  6. Adynamic ileus     LOS: 11 Gertha Lichtenberg 2/21/20178:44 AM

## 2015-12-16 NOTE — Progress Notes (Signed)
Report called to CCU.  Daughter will be updated.   Jessee Avers

## 2015-12-16 NOTE — Progress Notes (Signed)
[facility]  Daily Progress Note   Subjective: 4 Days Post-Op: left above-the-knee amputation  Received page last night at 11:30pm patient with RR in 50's on 50% oxygen - asking nurse to call rapid response. Patient placed on Bipap and transferred to unit for closer observation. While in unit given lasix by critical care team.   Objective: Filed Vitals:   12/16/15 0727 12/16/15 0800 12/16/15 0900 12/16/15 1000  BP:  147/81 141/89 150/85  Pulse: 93 93 94 95  Temp: 99.8 F (37.7 C)     TempSrc: Axillary     Resp: 39 34 37 25  Height:      Weight:      SpO2: 100% 100% 100% 99%    Intake/Output Summary (Last 24 hours) at 12/16/15 1056 Last data filed at 12/16/15 1006  Gross per 24 hour  Intake    100 ml  Output   4175 ml  Net  -4075 ml    Physical Exam: A&Ox3, NAD CV: RRR Pulmonary: CTA Bilaterally Abdomen: Soft, Nontender, Nondistended Vascular: Left Lower Extremity: dressing removed - staples clean , dry and intact. No drainage. Stump edematous.   Laboratory: CBC    Component Value Date/Time   WBC 28.5* 12/16/2015 0440   WBC 4.9 10/25/2014 1331   HGB 8.4* 12/16/2015 0440   HGB 12.1* 10/25/2014 1331   HCT 25.8* 12/16/2015 0440   HCT 38.1* 10/25/2014 1331   PLT 204 12/16/2015 0440   PLT 151 10/25/2014 1331    BMET    Component Value Date/Time   NA 151* 12/16/2015 0440   NA 138 10/25/2014 1331   K 3.5 12/16/2015 0440   K 3.9 10/25/2014 1331   CL 118* 12/16/2015 0440   CL 109* 10/25/2014 1331   CO2 21* 12/16/2015 0440   CO2 21 10/25/2014 1331   GLUCOSE 229* 12/16/2015 0440   GLUCOSE 232* 10/25/2014 1331   BUN 60* 12/16/2015 0440   BUN 17 10/25/2014 1331   CREATININE 2.42* 12/16/2015 0440   CREATININE 1.11 10/25/2014 1331   CALCIUM 7.4* 12/16/2015 0440   CALCIUM 8.7 10/25/2014 1331   GFRNONAA 25* 12/16/2015 0440   GFRNONAA >60 10/25/2014 1331   GFRNONAA 52* 07/08/2014 1051   GFRAA 30* 12/16/2015 0440   GFRAA >60 10/25/2014  1331   GFRAA >60 07/08/2014 1051    Assessment/Planning: 70 year old male s/p 4 Days Post-Op: left above-the-knee amputation transferred back to ICU for respiratory distress. 1) Stump healing well 2) Care as per critical care team  [facility] PA-C 12/16/2015 10:56 AM

## 2015-12-16 NOTE — Progress Notes (Signed)
Pt intubated during shift due to increased work of breathing respiratory rate 37 to 40's on bipap; per Dr. Renette Butters' verbal orders placed order for 50 mcg fentanyl, 2 mg versed, and 20 mg succinylcholine once via iv administered during intubation; placed order for propofol per Dr. Mathis Fare verbal orders; vent FiO2 40% pts O2 sats 99%; vss; adequate uop via foley; sinus rhythm with occasional PVC's; no s/s of pain; pts son and daughter updated about plan of care and questions answered via telephone will continue to monitor and assess pt

## 2015-12-16 NOTE — Progress Notes (Signed)
Dr. Halford Chessman notified of patient's ABG results.  Morphine '2mg'$  IV given for respiratory rate control and discomfort.  Also discussed pt's fever of 102.4 axillary with MD.  Pt has PO tylenol and rectal tylenol ordered, however pt is NPO on Bipap and has a rectal tube; MD placed orders for IV tylenol once.  Discussed giving patient IV lasix for diuresis; MD placed orders.  Will continue to monitor.

## 2015-12-16 NOTE — Progress Notes (Signed)
Pharmacy Antibiotic Note  Stephen Camacho is a 70 y.o. male Started on abx 12/16/15 for pneumonia.  Pharmacy has been consulted for vancomycin and ceftazidime dosing.  Plan: TBW 72kg  IBW 86.8kg  DW 72kg  Vd 50L kei 0.028 hr-1  T1/2 25 hours Vancomycin 1250 mg q 36 hours ordered with stacked dosing. Level before 4th dose. Goal 15-20.  Ceftazidime 1 gram q 24 hours ordered for renal dosing.  Height: '6\' 4"'$  (193 cm) Weight: 158 lb 11.7 oz (72 kg) IBW/kg (Calculated) : 86.8  Temp (24hrs), Avg:99.4 F (37.4 C), Min:97.6 F (36.4 C), Max:102.4 F (39.1 C)   Recent Labs Lab 12/10/15 0442  12/12/15 0501  12/13/15 0510 12/13/15 1701 12/14/15 0213 12/15/15 0500 12/16/15 0440 12/16/15 0519  WBC  --   < >  --   < > 17.6* 19.5* 17.8* 19.1* 28.5*  --   CREATININE 2.19*  < > 1.76*  --  1.74*  --  2.02* 2.37* 2.42*  --   LATICACIDVEN  --   --   --   --   --   --   --   --   --  2.5*  VANCOTROUGH 9*  --   --   --   --   --   --   --   --   --   < > = values in this interval not displayed.  Estimated Creatinine Clearance: 28.9 mL/min (by C-G formula based on Cr of 2.42).    No Known Allergies  Antimicrobials this admission:   Dose adjustments this admission:   Microbiology results: 2/18 BCx: NGTD 2/18 UCx: NGTD  2/18 Sputum: Yeast (light), no Gram stain  MRSA PCR: (-)  CXR: increased opacification.  Thank you for allowing pharmacy to be a part of this patient's care.  Ladd Cen,Chrystian S 12/16/2015 6:42 AM

## 2015-12-16 NOTE — Progress Notes (Signed)
[facility] Progress Note Patient Name: Stephen Camacho DOB: 11-Aug-1946 MRN: [id number]   Date of Service  12/16/2015  HPI/Events of Note  Febrile, respiratory distress, respiratory alkalosis on ABG.   eICU Interventions  Continue BiPAP.  IV tylenol x one for fever.  Lasix 60 mg IV x one.  Add Abx for possible HCAP/aspiration.  If no improvement, then might need intubation.      Intervention Category Major Interventions: Other:  Kalib Bhagat 12/16/2015, 4:58 AM

## 2015-12-16 NOTE — Progress Notes (Signed)
Nutrition Follow-up     INTERVENTION:   EN: recommend starting Adult Tube Feeding Protocol with Vital High Protein at rate of 20 ml/hr with initial goal rate of 40 ml/hr; will reassess appropriate goal rate on follow-up   NUTRITION DIAGNOSIS:   Inadequate oral intake related to inability to eat as evidenced by NPO status. 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  Pt with decline in respiratory status requiring reintubation  Diet Order:  Diet NPO time specified   Recent Labs Lab 12/13/15 0510 12/14/15 0213 12/15/15 0500 12/16/15 0440  NA 145 148* 147* 151*  K 3.8 3.3* 3.6 3.5  CL 111 112* 115* 118*  CO2 '28 26 22 '$ 21*  BUN 34* 36* 50* 60*  CREATININE 1.74* 2.02* 2.37* 2.42*  CALCIUM 7.3* 7.4* 7.3* 7.4*  MG  --  1.9 2.0 2.2  PHOS 1.7* 2.5 3.7  --   GLUCOSE 298* 248* 172* 229*    Glucose Profile:  Recent Labs  12/16/15 0723 12/16/15 1107 12/16/15 1550  GLUCAP 225* 228* 209*    Meds: ss novolog, lantus, senokot  Height: pre-amputation height of 6 feet 4 inches; estimated measured length of 44 inches post bilateral amputation  Ht Readings from Last 1 Encounters:  12/05/15 '6\' 4"'$  (1.93 m)    Weight:   Wt Readings from Last 1 Encounters:  12/16/15 158 lb 11.7 oz (72 kg)    BMI:  Body mass index is 19.33 kg/(m^2).  Estimated  Nutritional Needs:   Kcal:  1661 kcals (Ve: 12.4, Tmax: 38.6) using wt of 73.8 kg and length of 44 inches  Protein:  111-148 g (1.5-2.0 g/kg) using wt of 74 kg  Fluid:  1850-2220 mL (25-30 ml/kg)   HIGH Care Level  Kerman Passey MS, RD, LDN [phone] Pager  [phone] Weekend/On-Call Pager

## 2015-12-16 NOTE — Care Management (Signed)
Patient intubated this afternoon and placed on vent

## 2015-12-16 NOTE — Progress Notes (Signed)
Spoke to Dr. Halford Chessman regarding patient's respirations 45-50 times per minute; suggested getting a arterial blood gas. Orders received. Will continue to monitor.

## 2015-12-16 NOTE — Care Management (Signed)
Patient was transferred to icu due to respiratory distress requiring continuous bipap

## 2015-12-17 ENCOUNTER — Inpatient Hospital Stay: Payer: PPO

## 2015-12-17 ENCOUNTER — Encounter: Payer: Self-pay | Admitting: Radiology

## 2015-12-17 DIAGNOSIS — J189 Pneumonia, unspecified organism: Secondary | ICD-10-CM

## 2015-12-17 LAB — PHOSPHORUS: PHOSPHORUS: 3.3 mg/dL (ref 2.5–4.6)

## 2015-12-17 LAB — CBC
HEMATOCRIT: 26.7 % — AB (ref 40.0–52.0)
Hemoglobin: 8.4 g/dL — ABNORMAL LOW (ref 13.0–18.0)
MCH: 27.4 pg (ref 26.0–34.0)
MCHC: 31.6 g/dL — AB (ref 32.0–36.0)
MCV: 86.9 fL (ref 80.0–100.0)
PLATELETS: 190 10*3/uL (ref 150–440)
RBC: 3.07 MIL/uL — ABNORMAL LOW (ref 4.40–5.90)
RDW: 16.4 % — AB (ref 11.5–14.5)
WBC: 36.9 10*3/uL — AB (ref 3.8–10.6)

## 2015-12-17 LAB — BLOOD GAS, ARTERIAL
ALLENS TEST (PASS/FAIL): POSITIVE — AB
Acid-Base Excess: 5.1 mmol/L — ABNORMAL HIGH (ref 0.0–3.0)
BICARBONATE: 27.8 meq/L (ref 21.0–28.0)
FIO2: 0.3
MECHANICAL RATE: 15
MECHVT: 500 mL
O2 Saturation: 92.6 %
PATIENT TEMPERATURE: 37
PEEP: 5 cmH2O
PO2 ART: 58 mmHg — AB (ref 83.0–108.0)
pCO2 arterial: 34 mmHg (ref 32.0–48.0)
pH, Arterial: 7.52 — ABNORMAL HIGH (ref 7.350–7.450)

## 2015-12-17 LAB — GLUCOSE, CAPILLARY
GLUCOSE-CAPILLARY: 168 mg/dL — AB (ref 65–99)
GLUCOSE-CAPILLARY: 169 mg/dL — AB (ref 65–99)
GLUCOSE-CAPILLARY: 225 mg/dL — AB (ref 65–99)
GLUCOSE-CAPILLARY: 274 mg/dL — AB (ref 65–99)
Glucose-Capillary: 226 mg/dL — ABNORMAL HIGH (ref 65–99)
Glucose-Capillary: 205 mg/dL — ABNORMAL HIGH (ref 65–99)

## 2015-12-17 LAB — CATH TIP CULTURE: Culture: NO GROWTH

## 2015-12-17 LAB — CULTURE, RESPIRATORY W GRAM STAIN

## 2015-12-17 LAB — BASIC METABOLIC PANEL
ANION GAP: 11 (ref 5–15)
BUN: 52 mg/dL — AB (ref 6–20)
CALCIUM: 7.3 mg/dL — AB (ref 8.9–10.3)
CO2: 27 mmol/L (ref 22–32)
Chloride: 116 mmol/L — ABNORMAL HIGH (ref 101–111)
Creatinine, Ser: 2.4 mg/dL — ABNORMAL HIGH (ref 0.61–1.24)
GFR calc Af Amer: 30 mL/min — ABNORMAL LOW (ref >60)
GFR, EST NON AFRICAN AMERICAN: 26 mL/min — AB (ref >60)
GLUCOSE: 232 mg/dL — AB (ref 65–99)
Potassium: 2.6 mmol/L — CL (ref 3.5–5.1)
Sodium: 154 mmol/L — ABNORMAL HIGH (ref 135–145)

## 2015-12-17 LAB — URINE CULTURE
Culture: NO GROWTH
SPECIAL REQUESTS: NORMAL

## 2015-12-17 LAB — MAGNESIUM: Magnesium: 2 mg/dL (ref 1.7–2.4)

## 2015-12-17 LAB — SODIUM
SODIUM: 154 mmol/L — AB (ref 135–145)
Sodium: 155 mmol/L — ABNORMAL HIGH (ref 135–145)

## 2015-12-17 LAB — CULTURE, RESPIRATORY

## 2015-12-17 LAB — MRSA PCR SCREENING: MRSA by PCR: NEGATIVE

## 2015-12-17 LAB — PROCALCITONIN: Procalcitonin: 4.32 ng/mL

## 2015-12-17 MED ORDER — FREE WATER
200.0000 mL | Status: DC
  Administered 2015-12-17 – 2015-12-20 (×18): 200 mL

## 2015-12-17 MED ORDER — CHLORHEXIDINE GLUCONATE 0.12% ORAL RINSE (MEDLINE KIT)
15.0000 mL | Freq: Two times a day (BID) | OROMUCOSAL | Status: DC
  Administered 2015-12-17 – 2015-12-21 (×9): 15 mL via OROMUCOSAL
  Filled 2015-12-17 (×10): qty 15

## 2015-12-17 MED ORDER — INSULIN GLARGINE 100 UNIT/ML ~~LOC~~ SOLN
18.0000 [IU] | Freq: Every day | SUBCUTANEOUS | Status: DC
  Administered 2015-12-18 – 2015-12-20 (×3): 18 [IU] via SUBCUTANEOUS
  Filled 2015-12-17 (×4): qty 0.18

## 2015-12-17 MED ORDER — IBUPROFEN 400 MG PO TABS
400.0000 mg | ORAL_TABLET | Freq: Once | ORAL | Status: AC
  Administered 2015-12-17: 400 mg via ORAL
  Filled 2015-12-17: qty 1

## 2015-12-17 MED ORDER — VITAL HIGH PROTEIN PO LIQD
1000.0000 mL | ORAL | Status: DC
  Administered 2015-12-17 – 2015-12-20 (×4): 1000 mL

## 2015-12-17 MED ORDER — PRO-STAT SUGAR FREE PO LIQD
30.0000 mL | Freq: Three times a day (TID) | ORAL | Status: DC
  Administered 2015-12-17 (×2): 30 mL

## 2015-12-17 MED ORDER — POTASSIUM CL IN DEXTROSE 5% 20 MEQ/L IV SOLN
20.0000 meq | INTRAVENOUS | Status: DC
  Administered 2015-12-17 (×2): 20 meq via INTRAVENOUS
  Filled 2015-12-17 (×4): qty 1000

## 2015-12-17 MED ORDER — POTASSIUM CHLORIDE 20 MEQ/15ML (10%) PO SOLN
40.0000 meq | ORAL | Status: AC
  Administered 2015-12-17 (×2): 40 meq
  Filled 2015-12-17 (×2): qty 30

## 2015-12-17 MED ORDER — POLYETHYLENE GLYCOL 3350 17 G PO PACK: 17.0000 g | PACK | Freq: Every day | ORAL | Status: DC | PRN

## 2015-12-17 MED ORDER — ASPIRIN 81 MG PO CHEW
162.0000 mg | CHEWABLE_TABLET | Freq: Every day | ORAL | Status: DC
  Administered 2015-12-17 – 2015-12-19 (×3): 162 mg via ORAL
  Filled 2015-12-17 (×3): qty 2

## 2015-12-17 MED ORDER — ANTISEPTIC ORAL RINSE SOLUTION (CORINZ)
7.0000 mL | Freq: Four times a day (QID) | OROMUCOSAL | Status: DC
  Administered 2015-12-17 – 2015-12-21 (×16): 7 mL via OROMUCOSAL
  Filled 2015-12-17 (×20): qty 7

## 2015-12-17 MED ORDER — INSULIN ASPART 100 UNIT/ML ~~LOC~~ SOLN
0.0000 [IU] | SUBCUTANEOUS | Status: DC
  Administered 2015-12-17: 4 [IU] via SUBCUTANEOUS
  Administered 2015-12-17: 11 [IU] via SUBCUTANEOUS
  Administered 2015-12-17 – 2015-12-18 (×2): 7 [IU] via SUBCUTANEOUS
  Administered 2015-12-18 (×2): 4 [IU] via SUBCUTANEOUS
  Administered 2015-12-18 (×3): 7 [IU] via SUBCUTANEOUS
  Administered 2015-12-19 (×3): 3 [IU] via SUBCUTANEOUS
  Administered 2015-12-19 (×2): 4 [IU] via SUBCUTANEOUS
  Administered 2015-12-19: 7 [IU] via SUBCUTANEOUS
  Administered 2015-12-20 (×2): 4 [IU] via SUBCUTANEOUS
  Administered 2015-12-20 (×2): 7 [IU] via SUBCUTANEOUS
  Administered 2015-12-20 – 2015-12-21 (×3): 4 [IU] via SUBCUTANEOUS
  Administered 2015-12-21: 11 [IU] via SUBCUTANEOUS
  Filled 2015-12-17: qty 3
  Filled 2015-12-17: qty 7
  Filled 2015-12-17 (×2): qty 4
  Filled 2015-12-17 (×2): qty 7
  Filled 2015-12-17: qty 4
  Filled 2015-12-17: qty 7
  Filled 2015-12-17: qty 4
  Filled 2015-12-17 (×2): qty 7
  Filled 2015-12-17 (×2): qty 3
  Filled 2015-12-17: qty 7
  Filled 2015-12-17 (×2): qty 4
  Filled 2015-12-17 (×2): qty 11
  Filled 2015-12-17 (×3): qty 4
  Filled 2015-12-17: qty 7
  Filled 2015-12-17 (×2): qty 4

## 2015-12-17 NOTE — Progress Notes (Signed)
[facility]  ROUNDING NOTE   Subjective:  Intubated 2/21, now on ventilator Na level is critically high, potassium is low UOP is good S Cr stable    Objective:  Vital signs in last 24 hours:  Temp:  [97.6 F (36.4 C)-101 F (38.3 C)] 101 F (38.3 C) (02/22 0750) Pulse Rate:  [87-100] 90 (02/22 0800) Resp:  [21-33] 31 (02/22 0800) BP: (89-158)/(53-101) 134/71 mmHg (02/22 0800) SpO2:  [95 %-100 %] 98 % (02/22 0800) FiO2 (%):  [30 %-40 %] 35 % (02/22 0751)  Weight change:  Filed Weights   12/14/15 1616 12/15/15 0500 12/16/15 0359  Weight: 72.802 kg (160 lb 8 oz) 72.712 kg (160 lb 4.8 oz) 72 kg (158 lb 11.7 oz)    Intake/Output: I/O last 3 completed shifts: In: 1406.8 [I.V.:375.5; NG/GT:681.3; IV Piggyback:350] Out: 0086 [Urine:8000; Stool:150]   Intake/Output this shift:  Total I/O In: 19.4 [I.V.:19.4] Out: -   Physical Exam: General: Critically ill appearing  Head: moist oral mucosal membranes  E/ENT: ETT,  OGT  Neck: Right central line IJ  Lungs:   Vent assisted  Heart: Tachycardic, regular  Abdomen:  Soft, nontender  Extremities:  Right AKA, left AKA (new), ++ dependent edema over extremities  Neurologic: sedated  Skin: No lesions     Foley, rectal tube    Basic Metabolic Panel:  Recent Labs Lab 12/12/15 0501 12/13/15 0510 12/14/15 0213 12/15/15 0500 12/16/15 0440 12/17/15 0421  NA 144 145 148* 147* 151* 154*  K 4.0 3.8 3.3* 3.6 3.5 2.6*  CL 112* 111 112* 115* 118* 116*  CO2 '27 28 26 22 '$ 21* 27  GLUCOSE 228* 298* 248* 172* 229* 232*  BUN 31* 34* 36* 50* 60* 52*  CREATININE 1.76* 1.74* 2.02* 2.37* 2.42* 2.40*  CALCIUM 7.4* 7.3* 7.4* 7.3* 7.4* 7.3*  MG 2.0  --  1.9 2.0 2.2 2.0  PHOS 2.4* 1.7* 2.5 3.7  --  3.3    Liver Function Tests: No results for input(s): AST, ALT, ALKPHOS, BILITOT, PROT, ALBUMIN in the last 168 hours. No results for input(s): LIPASE, AMYLASE in the last 168 hours. No results for input(s): AMMONIA in the last  168 hours.  CBC:  Recent Labs Lab 12/13/15 1701 12/14/15 0213 12/15/15 0500 12/16/15 0440 12/17/15 0421  WBC 19.5* 17.8* 19.1* 28.5* 36.9*  HGB 6.8* 8.2* 8.1* 8.4* 8.4*  HCT 21.2* 24.8* 24.4* 25.8* 26.7*  MCV 85.0 86.6 84.4 86.7 86.9  PLT 187 188 213 204 190    Cardiac Enzymes:  Recent Labs Lab 12/13/15 2020 12/14/15 0213 12/14/15 0809 12/16/15 0440  TROPONINI 2.03* 2.67* 3.00* 2.70*    BNP: Invalid input(s): POCBNP  CBG:  Recent Labs Lab 12/16/15 1550 12/16/15 1939 12/17/15 0002 12/17/15 0402 12/17/15 0746  GLUCAP 209* 154* 168* 225* 61*    Microbiology: Results for orders placed or performed during the hospital encounter of 12/05/15  Culture, respiratory (NON-Expectorated)     Status: None   Collection Time: 12/06/15  4:05 PM  Result Value Ref Range Status   Specimen Description NASAL SWAB  Final   Special Requests NONE  Final   Gram Stain RARE WBC SEEN FEW GRAM POSITIVE COCCI   Final   Culture HEAVY GROWTH STAPHYLOCOCCUS AUREUS  Final   Report Status 12/09/2015 FINAL  Final   Organism ID, Bacteria STAPHYLOCOCCUS AUREUS  Final      Susceptibility   Staphylococcus aureus - MIC*    CIPROFLOXACIN 2 INTERMEDIATE Intermediate     GENTAMICIN <=0.5  SENSITIVE Sensitive     OXACILLIN <=0.25 SENSITIVE Sensitive     TETRACYCLINE <=1 SENSITIVE Sensitive     VANCOMYCIN 1 SENSITIVE Sensitive     TRIMETH/SULFA <=10 SENSITIVE Sensitive     RIFAMPIN <=0.5 SENSITIVE Sensitive     Inducible Clindamycin NEGATIVE Sensitive     * HEAVY GROWTH STAPHYLOCOCCUS AUREUS  Culture, blood (Routine X 2) w Reflex to ID Panel     Status: None   Collection Time: 12/07/15 12:09 AM  Result Value Ref Range Status   Specimen Description BLOOD BLOOD RIGHT HAND  Final   Special Requests BOTTLES DRAWN AEROBIC AND ANAEROBIC 10CC  Final   Culture NO GROWTH 6 DAYS  Final   Report Status 12/13/2015 FINAL  Final  Culture, blood (Routine X 2) w Reflex to ID Panel     Status: None    Collection Time: 12/07/15 12:09 AM  Result Value Ref Range Status   Specimen Description BLOOD BLOOD LEFT HAND  Final   Special Requests BOTTLES DRAWN AEROBIC AND ANAEROBIC 10CC  Final   Culture NO GROWTH 6 DAYS  Final   Report Status 12/13/2015 FINAL  Final  Urine culture     Status: None   Collection Time: 12/07/15 12:11 AM  Result Value Ref Range Status   Specimen Description URINE, CLEAN CATCH  Final   Special Requests NONE  Final   Culture NO GROWTH 1 DAY  Final   Report Status 12/08/2015 FINAL  Final  MRSA PCR Screening     Status: None   Collection Time: 12/08/15  9:12 AM  Result Value Ref Range Status   MRSA by PCR NEGATIVE NEGATIVE Final    Comment:        The GeneXpert MRSA Assay (FDA approved for NASAL specimens only), is one component of a comprehensive MRSA colonization surveillance program. It is not intended to diagnose MRSA infection nor to guide or monitor treatment for MRSA infections.   C difficile quick scan w PCR reflex     Status: None   Collection Time: 12/11/15  1:54 PM  Result Value Ref Range Status   C Diff antigen NEGATIVE NEGATIVE Final   C Diff toxin NEGATIVE NEGATIVE Final   C Diff interpretation Negative for C. difficile  Final  Culture, blood (Routine X 2) w Reflex to ID Panel     Status: None (Preliminary result)   Collection Time: 12/13/15  9:33 AM  Result Value Ref Range Status   Specimen Description BLOOD LEFT ANTECUBITAL  Final   Special Requests BOTTLES DRAWN AEROBIC AND ANAEROBIC  4CC  Final   Culture NO GROWTH 3 DAYS  Final   Report Status PENDING  Incomplete  Culture, blood (Routine X 2) w Reflex to ID Panel     Status: None (Preliminary result)   Collection Time: 12/13/15  9:41 AM  Result Value Ref Range Status   Specimen Description BLOOD RIGHT ARM  Final   Special Requests BOTTLES DRAWN AEROBIC AND ANAEROBIC  2CC  Final   Culture NO GROWTH 3 DAYS  Final   Report Status PENDING  Incomplete  Urine culture     Status: None    Collection Time: 12/13/15 11:01 AM  Result Value Ref Range Status   Specimen Description URINE, CATHETERIZED  Final   Special Requests NONE  Final   Culture NO GROWTH 1 DAY  Final   Report Status 12/14/2015 FINAL  Final  Culture, expectorated sputum-assessment     Status: None   Collection Time:  12/13/15 11:30 AM  Result Value Ref Range Status   Specimen Description TRACHEAL ASPIRATE  Final   Special Requests NONE  Final   Sputum evaluation THIS SPECIMEN IS ACCEPTABLE FOR SPUTUM CULTURE  Final   Report Status 12/13/2015 FINAL  Final  Culture, respiratory (NON-Expectorated)     Status: None (Preliminary result)   Collection Time: 12/13/15 11:30 AM  Result Value Ref Range Status   Specimen Description TRACHEAL ASPIRATE  Final   Special Requests NONE Reflexed from [id number]  Final   Gram Stain   Final    FEW WBC SEEN FEW GRAM POSITIVE COCCI FEW YEAST FAIR SPECIMEN - 70-80% WBCS    Culture LIGHT GROWTH YEAST IDENTIFICATION TO FOLLOW   Final   Report Status PENDING  Incomplete  Cath Tip Culture     Status: None   Collection Time: 12/14/15 12:47 PM  Result Value Ref Range Status   Specimen Description CATH TIP  Final   Special Requests NONE  Final   Culture NO GROWTH 3 DAYS  Final   Report Status 12/17/2015 FINAL  Final  C difficile quick scan w PCR reflex     Status: None   Collection Time: 12/16/15  4:26 PM  Result Value Ref Range Status   C Diff antigen NEGATIVE NEGATIVE Final   C Diff toxin NEGATIVE NEGATIVE Final   C Diff interpretation Negative for C. difficile  Final  Culture, expectorated sputum-assessment     Status: None   Collection Time: 12/16/15  5:32 PM  Result Value Ref Range Status   Specimen Description SPUTUM  Final   Special Requests NONE  Final   Sputum evaluation THIS SPECIMEN IS ACCEPTABLE FOR SPUTUM CULTURE  Final   Report Status 12/16/2015 FINAL  Final    Coagulation Studies: No results for input(s): LABPROT, INR in the last 72  hours.  Urinalysis: No results for input(s): COLORURINE, LABSPEC, PHURINE, GLUCOSEU, HGBUR, BILIRUBINUR, KETONESUR, PROTEINUR, UROBILINOGEN, NITRITE, LEUKOCYTESUR in the last 72 hours.  Invalid input(s): APPERANCEUR    Imaging: Dg Chest 1 View  12/17/2015  CLINICAL DATA:  Acute onset of dyspnea.  Initial encounter. EXAM: CHEST 1 VIEW COMPARISON:  Chest radiograph performed 12/16/2015 FINDINGS: The patient's endotracheal tube is seen ending 3-4 cm above the carina. A right IJ line is noted ending about the distal SVC. An enteric tube is noted extending below the diaphragm. Mildly worsened patchy bilateral airspace opacities are noted, more prominent on the left, concerning for worsening pneumonia. Given prior appearance, this could also reflect interstitial edema. No definite pleural effusion or pneumothorax is seen. The cardiomediastinal silhouette is mildly enlarged. The patient is status post median sternotomy. No acute osseous abnormalities are seen. IMPRESSION: 1. Endotracheal tube seen ending 3-4 cm above the carina. 2. Right IJ line noted ending about the distal SVC. 3. Mildly worsened patchy bilateral airspace opacities, more prominent on the left, concerning for worsening pneumonia. Given prior appearance, this could also reflect interstitial edema. Electronically Signed   By: Garald Balding M.D.   On: 12/17/2015 04:12   Dg Abd 1 View  12/16/2015  CLINICAL DATA:  Orogastric tube placement EXAM: ABDOMEN - 1 VIEW COMPARISON:  Abdominal radiograph 12/14/2015 and 12/06/2015 FINDINGS: The enteric tube is positioned within the stomach, along the greater curvature of the stomach, in satisfactory position. The side port of the tube is within the stomach as well. There is some gaseous distension of left and central abdominal bowel loops. The gaseous distention is without significant change from the  radiograph of 12/14/2015. No evidence of pneumatosis or free intraperitoneal air. Opacities are noted at  both lung bases. IMPRESSION: Satisfactory position of enteric tube (orogastric vs nasogastric) longer greater curvature of the stomach. No significant change in ileus pattern. Electronically Signed   By: Curlene Dolphin M.D.   On: 12/16/2015 13:14   US Venous Img Upper Uni Right  12/16/2015  CLINICAL DATA:  Right upper extremity edema. History of lung cancer. History of pulmonary embolism and right internal jugular approach central venous catheter placement. Evaluate for DVT. EXAM: RIGHT UPPER EXTREMITY VENOUS DOPPLER ULTRASOUND TECHNIQUE: Gray-scale sonography with graded compression, as well as color Doppler and duplex ultrasound were performed to evaluate the upper extremity deep venous system from the level of the subclavian vein and including the jugular, axillary, basilic, radial, ulnar and upper cephalic vein. Spectral Doppler was utilized to evaluate flow at rest and with distal augmentation maneuvers. COMPARISON:  Chest radiograph - earlier same day FINDINGS: Contralateral Subclavian Vein: Respiratory phasicity is normal and symmetric with the symptomatic side. No evidence of thrombus. Normal compressibility. Internal Jugular Vein: Not evaluated secondary to bandages associated with jugular approach central venous catheter. Subclavian Vein: No evidence of thrombus. Normal compressibility, respiratory phasicity and response to augmentation. Axillary Vein: No evidence of thrombus. Normal compressibility, respiratory phasicity and response to augmentation. Cephalic Vein: No evidence of thrombus. Normal compressibility, respiratory phasicity and response to augmentation. Basilic Vein: No evidence of thrombus. Normal compressibility, respiratory phasicity and response to augmentation. Brachial Veins: No evidence of thrombus. Normal compressibility, respiratory phasicity and response to augmentation. Radial Veins: No evidence of thrombus. Normal compressibility, respiratory phasicity and response to  augmentation. Ulnar Veins: No evidence of thrombus. Normal compressibility, respiratory phasicity and response to augmentation. Venous Reflux:  None visualized. Other Findings:  None visualized. IMPRESSION: No evidence of DVT within right upper extremity. Electronically Signed   By: Sandi Mariscal M.D.   On: 12/16/2015 14:15   Dg Chest Port 1 View  12/16/2015  CLINICAL DATA:  Endotracheal tube placement EXAM: PORTABLE CHEST 1 VIEW COMPARISON:  Yesterday FINDINGS: New endotracheal tube with tip just below the clavicular heads. An orogastric tube reaches stomach at least. Right IJ central line in stable position with tip at the upper right atrium. EKG leads over the chest create artifact. Essentially stable cardiopericardial enlargement. The patient is status post CABG. Improved diffuse airspace disease. Superimposed layering pleural effusions. No evidence of pneumothorax (pleural reflection or skin fold noted on the right with lung sutures at both apices). IMPRESSION: 1. New endotracheal and orogastric tubes are in good position. 2. Improved pulmonary edema. Electronically Signed   By: Monte Fantasia M.D.   On: 12/16/2015 13:16   Dg Chest Port 1 View  12/15/2015  CLINICAL DATA:  Aspiration into respiratory tract. EXAM: PORTABLE CHEST 1 VIEW COMPARISON:  Radiograph yesterday at 0542 hour FINDINGS: Tip of the right central line in the distal SVC. Patient is post median sternotomy. Cardiomegaly is unchanged. Worsening perihilar pulmonary edema from prior exam. Obscuration of left hemidiaphragm, with new right basilar opacity. Chain sutures in the right lung apex. Skin fold projects over the right hemithorax. IMPRESSION: 1. Increasing pulmonary edema.  Unchanged cardiomegaly. 2. Increasing bibasilar opacities, can be seen in the setting of aspiration. Alternatively this could be vascular. Electronically Signed   By: Jeb Levering M.D.   On: 12/15/2015 23:03     Medications:   . nitroGLYCERIN    .  phenylephrine (NEO-SYNEPHRINE) Adult infusion Stopped (12/11/15 2300)  .  propofol (DIPRIVAN) infusion 45 mcg/kg/min (12/17/15 0557)   . amiodarone  150 mg Intravenous Once  . amiodarone  200 mg Oral BID  . antiseptic oral rinse  7 mL Mouth Rinse QID  . antiseptic oral rinse  7 mL Mouth Rinse QID  . aspirin  150 mg Rectal Daily  . bisacodyl  10 mg Rectal Once  . cefTAZidime (FORTAZ)  IV  1 g Intravenous Once  . cefTAZidime (FORTAZ)  IV  1 g Intravenous Q24H  . chlorhexidine gluconate  15 mL Mouth Rinse BID  . feeding supplement (VITAL HIGH PROTEIN)  1,000 mL Per Tube Q24H  . free water  200 mL Per Tube 3 times per day  . heparin subcutaneous  5,000 Units Subcutaneous Q12H  . insulin aspart  0-15 Units Subcutaneous Q4H  . insulin glargine  11 Units Subcutaneous Daily  . insulin regular  8 Units Subcutaneous Once  . metoprolol  2.5 mg Intravenous 4 times per day  . pantoprazole sodium  40 mg Per Tube Daily  . polyethylene glycol  17 g Oral Daily  . senna-docusate  1 tablet Oral BID  . sennosides  5 mL Per Tube BID  . sodium chloride  500 mL Intravenous Once  . vancomycin  1,250 mg Intravenous Once  . vancomycin  1,250 mg Intravenous Q36H   acetaminophen, acetaminophen **OR** [DISCONTINUED] acetaminophen, albuterol, bisacodyl, fentaNYL (SUBLIMAZE) injection, hydrALAZINE, ipratropium-albuterol, labetalol, morphine injection, naLOXone (NARCAN)  injection, ondansetron **OR** ondansetron (ZOFRAN) IV, ondansetron (ZOFRAN) IV  Assessment/ Plan:  Mr. Stephen Camacho is a 70 y.o. black  male with hypertension, hyperlipidemia, diabetes mellitus type II, coronary artery disease status post 2 vessel CABG, peripheral arterial disease status post right above-the-knee amputation, seizure disorder, lung cancer, prostate cancer, history of CVA, who was admitted to [facility] on 12/05/2015 for ischemic left lower extremity. Hospital course complicated by acute renal failure, acute respiratory failure, new CVAs,  myocardial infarction.  1. Acute renal failure:  ATN from contrast exposure. nonoliguric urine output.  Baseline creatinine of 0.83.  - Cr had stabilized at 1.74, but started to worsen again since 2/18  - ? Due to infection (WBC count worse, fever), likely aspiration - vascath removed - currently no acute indication of HD - S Cr stablizing. Currently 2.40    2. Hypernatremia, Na high at 154 - start iv D5W + Kcl   3. Ischemic left lower extremity: peripheral vascular disease: status post Left AKA on 2/17 Dr. Delana Meyer   4. Acute respiratory failure: Status post intubation and on mechanical ventilation. With acute myocardial infarction and acute CVA.  - probably combination of acute pulm edema and aspiration pneumonia - re-intubated 2/21  5. Anemia with renal failure: PRBC transfusion 2/14 and 2/17.    6. Hypokalemia - replace iv    LOS: 12 Stephen Camacho 2/22/20179:53 AM

## 2015-12-17 NOTE — Progress Notes (Signed)
Nutrition Follow-up    INTERVENTION:   EN: recommend increasing goal rate to 45 ml/hr, addition of Prostat TID; meets 100% estimated calorie and protein needs based on ASPEN guidelines  NUTRITION DIAGNOSIS:   Inadequate oral intake related to inability to eat as evidenced by NPO status. 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  Pt remains on vent  Diet Order:  Diet NPO time specified   EN: tolerating Vital High protein at rate of 40 ml/hr, free water flush increase to 200 mL q 4 hours per MD  Digestive System: rectal tube in place, +diarrhea, abdomen soft, BS active, no signs of TF intolerance   Recent Labs Lab 12/14/15 0213 12/15/15 0500 12/16/15 0440 12/17/15 0421  NA 148* 147* 151* 154*  K 3.3* 3.6 3.5 2.6*  CL 112* 115* 118* 116*  CO2 26 22 21* 27  BUN 36* 50* 60* 52*  CREATININE 2.02* 2.37* 2.42* 2.40*  CALCIUM 7.4* 7.3* 7.4* 7.3*  MG 1.9 2.0 2.2 2.0  PHOS 2.5 3.7  --  3.3  GLUCOSE 248* 172* 229* 232*    Glucose Profile:   Recent Labs  12/17/15 0002 12/17/15 0402 12/17/15 0746  GLUCAP 168* 225* 226*   Meds: D5 with Kcl at 75 ml/hr (306 kcals), ss novolog, lantus, bowel regimen adjusted today  Height:   Ht Readings from Last 1 Encounters:  12/05/15 '6\' 4"'$  (1.93 m)    Weight:   Wt Readings from Last 1 Encounters:   12/16/15 158 lb 11.7 oz (72 kg)    Filed Weights   12/14/15 1616 12/15/15 0500 12/16/15 0359  Weight: 160 lb 8 oz (72.802 kg) 160 lb 4.8 oz (72.712 kg) 158 lb 11.7 oz (72 kg)    BMI:  Body mass index is 19.33 kg/(m^2).  Estimated Nutritional Needs:   Kcal:  1711 kcals (Ve: 15.6, Tmax: 38.6) using wt of 72 kg, legnth of 44 inches  Protein:  108-144 g (1.5-2.0 g/kg)   Fluid:  1800-2160 mL (25-30 ml/kg)   HIGH Care Level  Kerman Passey MS, RD, LDN [phone] Pager  [phone] Weekend/On-Call Pager

## 2015-12-17 NOTE — Progress Notes (Signed)
Speech Therapy Note: reviewed chart notes; consulted NSG re: pt's transfer to CCU. Pt was orally intubated sec. To worsening respiratory status(CXR). He is sedated and NPO. ST will sign off at this time. NSG to reconsult when pt is extubated and appropriate for po assessment again. NSG agreed.

## 2015-12-17 NOTE — Progress Notes (Signed)
[facility] Progress Note Patient Name: Stephen Camacho DOB: 09/12/1946 MRN: [id number]   Date of Service  12/17/2015  HPI/Events of Note  Hypokalemia  eICU Interventions  Potassium replaced     Intervention Category Intermediate Interventions: Electrolyte abnormality - evaluation and management  Mariene Dickerman 12/17/2015, 5:39 AM

## 2015-12-17 NOTE — Therapy (Signed)
Patient transported to and from CT via portable vent at ordered settings without incident. Back in room on Servo i.

## 2015-12-17 NOTE — Progress Notes (Signed)
Notified Dr. Juanell Fairly that  Patient has temp of 101- tylenol was given, Potassium 2.6 receiving scheduled potassium- asked if he wanted to recheck today- he said no- Stated that patient's blood sugars are in the 200's and patient receiving lantus at 11 units daily- he stated he would increase dose to 18 units.  Sodium 154- Dr. Candiss Norse placed orders for D5 and KCL'@75ML'$ /HR.  Free water order increased frequency.

## 2015-12-17 NOTE — Progress Notes (Signed)
 Vein & Vascular Surgery  Daily Progress Note   Subjective: 5 Days Post-Op: left above-the-knee amputation  Patient intubated yesterday. On Neo. Sedated. No acute distress.   Objective: Filed Vitals:   12/17/15 0500 12/17/15 0600 12/17/15 0750 12/17/15 0751  BP: 142/77 133/71    Pulse: 96 96    Temp:   101 F (38.3 C)   TempSrc:   Oral   Resp:      Height:      Weight:      SpO2: 95% 96%  98%    Intake/Output Summary (Last 24 hours) at 12/17/15 0807 Last data filed at 12/17/15 0600  Gross per 24 hour  Intake 1287.43 ml  Output   5450 ml  Net -4162.57 ml    Physical Exam: Sedated on vent. Unable to answer questions.  CV: RRR Pulmonary: Course Abdomen: Soft, Nontender, Nondistended Vascular: Left Lower Extremity: Staples clean , dry and intact. No drainage. Stump edematous.   Laboratory: CBC    Component Value Date/Time   WBC 36.9* 12/17/2015 0421   WBC 4.9 10/25/2014 1331   HGB 8.4* 12/17/2015 0421   HGB 12.1* 10/25/2014 1331   HCT 26.7* 12/17/2015 0421   HCT 38.1* 10/25/2014 1331   PLT 190 12/17/2015 0421   PLT 151 10/25/2014 1331    BMET    Component Value Date/Time   NA 154* 12/17/2015 0421   NA 138 10/25/2014 1331   K 2.6* 12/17/2015 0421   K 3.9 10/25/2014 1331   CL 116* 12/17/2015 0421   CL 109* 10/25/2014 1331   CO2 27 12/17/2015 0421   CO2 21 10/25/2014 1331   GLUCOSE 232* 12/17/2015 0421   GLUCOSE 232* 10/25/2014 1331   BUN 52* 12/17/2015 0421   BUN 17 10/25/2014 1331   CREATININE 2.40* 12/17/2015 0421   CREATININE 1.11 10/25/2014 1331   CALCIUM 7.3* 12/17/2015 0421   CALCIUM 8.7 10/25/2014 1331   GFRNONAA 26* 12/17/2015 0421   GFRNONAA >60 10/25/2014 1331   GFRNONAA 52* 07/08/2014 1051   GFRAA 30* 12/17/2015 0421   GFRAA >60 10/25/2014 1331   GFRAA >60 07/08/2014 1051   Assessment/Planning: 70 year old male s/p 5 Days Post-Op: left above-the-knee amputation transferred back to ICU for respiratory distress, now  intubated. 1) Stump healing well 2) Care as per critical care team  [facility] PA-C 12/17/2015 8:07 AM

## 2015-12-17 NOTE — Progress Notes (Signed)
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 Now with acute respiratory failure, intubated due to pneumonia.  ASSESSMENT / PLAN:  PULMONARY A: Acute hypoxic respiratory failure Suspect pneumonia with sepsis. Review of most recent chest x-ray films shows bilateral diffuse infiltrates suspicious for pneumonia.  Intubated 2/11>>2/18 Reintubated 2/21>>  P:   -Continue broad-spectrum antibiotics, recheck blood, sputum and urine cultures. -This patient was just intubated yesterday. We'll not wean today, we will check a wakeup assessment to assess mental status.  CARDIOVASCULAR-NSTEMI A:  Status post NSTEMI Afib Short runs of V-tach P:  -Continue amiodarone maintenance dose. -Cardiology following - rec low dose ASA -When necessary, metoprolol, aspirin suppository.  RENAL A:   AKI secondary procedural IV dye and volume depletion; baseline creatinine 0.83 P:   -IVF, advance diet.  -Trend creatinine - currently improving -Renally dose meds -nephro following - vascath placed removed 02/19.   GASTROINTESTINAL A:   Pt now having diarrhea, (was receiving bowel regimen for ileus).  P:   -PPI for GI prophylaxis -Reduce bowel agents.  -continue tube feeds.   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  Fever, leukocytosis, likely due to septic shock.  P:   -Antibiotics as above -F/U cultures of blood, urine. --Consult ID for persistent fever and leukocytosis.   CULTURES: 02/11>Blood>NTD 02/11>Sputum>STAPHYLOCOCCUS AUREUS 02/18 Blood x2>> negative 02/18 Urine>> negative 02/18 Respiratory>> yeast 2/20, catheter tip culture >> negative thus far 2/21. Urine culture>> Sputum culture 2/21>> Repeat MRSA 2/22  ANTIBIOTICS:  Ceftaz 2/21>> Vancomycin 2/21>>  LINES:  -Urethral catheter, 7 days.  -RIJ, 11 days.   ENDOCRINE A:   Type 2 diabetes mellitus  With hyperglycemia.  P:   -Blood glucose monitoring with SSI coverage -continue long-acting insulin increased today.  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 H/O Seizures-Not on home seizure meds  Encephalopathy secondary to hypercapnia and likely due to recent CVA.   P:   Routine supportive care. Wakeup assessment to assess mental status. His prognosis appears poor given severity of CVA as well as other comorbidities.  --Will check CT head to see if his CVA has progressed, as patient is having persistent fevers and leukocytosis.    SOCIAL:  Prognosis appears poor given multiple problems as well as CVA. If continued aggressive care is desired, will need trach/peg placement. Attempted to reach Son/Daughter, could not reach.     STUDIES:  02/12: 2-D JKDT>26-71%; grade 1 diastolic dysfunction, LVH    SIGNIFICANT EVENTS: 02/07>ED with SOB and left calf pain>treated and released 02/09>ED with cold left foot and calf pain>CT angio>complete occlusion of the left fem-pop bypass graft>admitted 02/11>OR for angiogram with thrombolysis, thrombectomy of left peroneal artery, tibioperoneal trunk, popliteal artery, and femoral to popliteal bypass graft and  percutaneous transluminal angioplasty of the left peroneal  artery and distal bypass anastomosis and popliteal artery Post-op: residual thrombosis of femoral to popliteal bypass graft and all tibial vessels; acute respiratory failure>Intubated  ------------------------------------------   SUBJECTIVE:  The patient was transferred to the floor status on 2/19. However, he was required BiPAP overnight, and his sats dropped, with continued fevers and leukocytosis.  VITAL SIGNS: BP 134/71 mmHg  Pulse 90  Temp(Src) 101 F (38.3 C) (Oral)  Resp 31  Ht '6\' 4"'$  (1.93 m)  Wt 158 lb 11.7 oz (72 kg)  BMI 19.33 kg/m2  SpO2 98%  HEMODYNAMICS:    VENTILATOR SETTINGS: Vent Mode:  [-] PRVC FiO2 (%):  [30 %-40 %] 35 % Set Rate:  [15 bmp] 15 bmp Vt Set:  [500 mL] 500 mL PEEP:  [5 cmH20] 5 cmH20  INTAKE / OUTPUT: I/O last 3 completed shifts: In: 1406.8 [I.V.:375.5; NG/GT:681.3; IV Piggyback:350] Out: 8099 [Urine:8000; Stool:150]  PHYSICAL EXAMINATION: General: Frail looking male, NAD Neuro: Moves upper extremities to command HEENT: PERRLA, oral mucosa pink, ET tube in place. Cardiovascular: Afib, no MRG Lungs:  bilateral airflow, scattered rhonchi/rales in all lung fields. Decreased air entry bilaterally. Abdomen: Soft, distended; hypoactive bowl sounds Musculoskeletal:  Bilateral AKA; left stump with dressing Extremities: +femoral pulse Skin: Warm; no rash  LABS:  BMET  Recent Labs Lab 12/15/15 0500 12/16/15 0440 12/17/15 0421  NA 147* 151* 154*  K 3.6 3.5 2.6*  CL 115* 118* 116*  CO2 22 21* 27  BUN 50* 60* 52*  CREATININE 2.37* 2.42* 2.40*  GLUCOSE 172* 229* 232*    Electrolytes  Recent Labs Lab 12/13/15 0510 12/14/15 0213 12/15/15 0500 12/16/15 0440 12/17/15 0421  CALCIUM 7.3* 7.4* 7.3* 7.4* 7.3*  MG  --  1.9 2.0 2.2  --   PHOS 1.7* 2.5 3.7  --   --     CBC  Recent Labs Lab 12/15/15 0500 12/16/15 0440 12/17/15 0421  WBC 19.1* 28.5* 36.9*  HGB 8.1* 8.4*  8.4*  HCT 24.4* 25.8* 26.7*  PLT 213 204 190    Coag's No results for input(s): APTT, INR in the last 168 hours.  Sepsis Markers  Recent Labs Lab 12/16/15 0519  LATICACIDVEN 2.5*  PROCALCITON 3.52    ABG  Recent Labs Lab 12/16/15 0400 12/16/15 1430 12/17/15 0445  PHART 7.59* 7.48* 7.52*  PCO2ART 21* 36 34  PO2ART 68* 83 58*    Liver Enzymes No results for input(s): AST, ALT, ALKPHOS, BILITOT, ALBUMIN in the last 168 hours.  Cardiac Enzymes  Recent Labs Lab 12/14/15 0213 12/14/15 0809 12/16/15 0440  TROPONINI 2.67* 3.00* 2.70*    Glucose  Recent Labs Lab 12/16/15 1107 12/16/15 1550 12/16/15 1939 12/17/15 0002 12/17/15 0402 12/17/15 0746  GLUCAP 228* 209* 154* 168* 225* 226*    Imaging Dg Chest 1 View  12/17/2015  CLINICAL DATA:  Acute onset of dyspnea.  Initial encounter. EXAM: CHEST 1 VIEW COMPARISON:  Chest radiograph performed 12/16/2015 FINDINGS: The patient's endotracheal tube is seen ending 3-4 cm above the carina. A right IJ line is noted ending about the distal SVC. An enteric tube is noted extending below the diaphragm. Mildly worsened patchy bilateral airspace opacities are noted, more prominent on the left, concerning for worsening pneumonia. Given prior appearance, this could also reflect interstitial edema. No definite pleural effusion or pneumothorax is seen. The cardiomediastinal silhouette is mildly enlarged. The patient is status post median sternotomy. No acute osseous abnormalities are seen. IMPRESSION: 1. Endotracheal tube seen ending 3-4 cm above the carina. 2.  Right IJ line noted ending about the distal SVC. 3. Mildly worsened patchy bilateral airspace opacities, more prominent on the left, concerning for worsening pneumonia. Given prior appearance, this could also reflect interstitial edema. Electronically Signed   By: Garald Balding M.D.   On: 12/17/2015 04:12   Dg Abd 1 View  12/16/2015  CLINICAL DATA:  Orogastric tube placement  EXAM: ABDOMEN - 1 VIEW COMPARISON:  Abdominal radiograph 12/14/2015 and 12/06/2015 FINDINGS: The enteric tube is positioned within the stomach, along the greater curvature of the stomach, in satisfactory position. The side port of the tube is within the stomach as well. There is some gaseous distension of left and central abdominal bowel loops. The gaseous distention is without significant change from the radiograph of 12/14/2015. No evidence of pneumatosis or free intraperitoneal air. Opacities are noted at both lung bases. IMPRESSION: Satisfactory position of enteric tube (orogastric vs nasogastric) longer greater curvature of the stomach. No significant change in ileus pattern. Electronically Signed   By: Curlene Dolphin M.D.   On: 12/16/2015 13:14   US Venous Img Upper Uni Right  12/16/2015  CLINICAL DATA:  Right upper extremity edema. History of lung cancer. History of pulmonary embolism and right internal jugular approach central venous catheter placement. Evaluate for DVT. EXAM: RIGHT UPPER EXTREMITY VENOUS DOPPLER ULTRASOUND TECHNIQUE: Gray-scale sonography with graded compression, as well as color Doppler and duplex ultrasound were performed to evaluate the upper extremity deep venous system from the level of the subclavian vein and including the jugular, axillary, basilic, radial, ulnar and upper cephalic vein. Spectral Doppler was utilized to evaluate flow at rest and with distal augmentation maneuvers. COMPARISON:  Chest radiograph - earlier same day FINDINGS: Contralateral Subclavian Vein: Respiratory phasicity is normal and symmetric with the symptomatic side. No evidence of thrombus. Normal compressibility. Internal Jugular Vein: Not evaluated secondary to bandages associated with jugular approach central venous catheter. Subclavian Vein: No evidence of thrombus. Normal compressibility, respiratory phasicity and response to augmentation. Axillary Vein: No evidence of thrombus. Normal compressibility,  respiratory phasicity and response to augmentation. Cephalic Vein: No evidence of thrombus. Normal compressibility, respiratory phasicity and response to augmentation. Basilic Vein: No evidence of thrombus. Normal compressibility, respiratory phasicity and response to augmentation. Brachial Veins: No evidence of thrombus. Normal compressibility, respiratory phasicity and response to augmentation. Radial Veins: No evidence of thrombus. Normal compressibility, respiratory phasicity and response to augmentation. Ulnar Veins: No evidence of thrombus. Normal compressibility, respiratory phasicity and response to augmentation. Venous Reflux:  None visualized. Other Findings:  None visualized. IMPRESSION: No evidence of DVT within right upper extremity. Electronically Signed   By: Sandi Mariscal M.D.   On: 12/16/2015 14:15   Dg Chest Port 1 View  12/16/2015  CLINICAL DATA:  Endotracheal tube placement EXAM: PORTABLE CHEST 1 VIEW COMPARISON:  Yesterday FINDINGS: New endotracheal tube with tip just below the clavicular heads. An orogastric tube reaches stomach at least. Right IJ central line in stable position with tip at the upper right atrium. EKG leads over the chest create artifact. Essentially stable cardiopericardial enlargement. The patient is status post CABG. Improved diffuse airspace disease. Superimposed layering pleural effusions. No evidence of pneumothorax (pleural reflection or skin fold noted on the right with lung sutures at both apices). IMPRESSION: 1. New endotracheal and orogastric tubes are in good position. 2. Improved pulmonary edema. Electronically Signed   By: Monte Fantasia M.D.   On: 12/16/2015 13:16      Deep Ashby Dawes, M.D.   Critical Care Attestation.  I have personally obtained a history, examined the patient, evaluated laboratory and imaging results, formulated the assessment and plan and placed orders. The Patient requires high complexity decision making for assessment and  support, frequent evaluation and titration of therapies, application of advanced monitoring technologies and extensive interpretation of multiple databases. The patient has critical illness that could lead imminently to failure of 1 or more organ systems and requires the highest level of physician preparedness to intervene.  Critical Care Time devoted to patient care services described in this note is 35 minutes and is exclusive of time spent in procedures.

## 2015-12-17 NOTE — Progress Notes (Signed)
RN notified [facility] RN that patient continues to have fever of 101.7 even after tylenol was given. Most recent blood cx 12-16-15, [facility] RN to notify MD.

## 2015-12-17 NOTE — Care Management (Signed)
Reintubated 2/21 and on ventilator.  Patient is full code.  Discussed code status and long term acute care.  CM will reach out to patient's family

## 2015-12-18 ENCOUNTER — Inpatient Hospital Stay: Payer: PPO

## 2015-12-18 LAB — BLOOD GAS, ARTERIAL
ALLENS TEST (PASS/FAIL): POSITIVE — AB
Acid-Base Excess: 1.8 mmol/L (ref 0.0–3.0)
Bicarbonate: 27.2 mEq/L (ref 21.0–28.0)
FIO2: 0.35
O2 SAT: 95.8 %
PCO2 ART: 45 mmHg (ref 32.0–48.0)
PEEP: 5 cmH2O
Patient temperature: 37
RATE: 15 resp/min
VT: 500 mL
pH, Arterial: 7.39 (ref 7.350–7.450)
pO2, Arterial: 81 mmHg — ABNORMAL LOW (ref 83.0–108.0)

## 2015-12-18 LAB — CULTURE, BLOOD (ROUTINE X 2)
CULTURE: NO GROWTH
CULTURE: NO GROWTH

## 2015-12-18 LAB — URINALYSIS COMPLETE WITH MICROSCOPIC (ARMC ONLY)
BACTERIA UA: NONE SEEN
Bilirubin Urine: NEGATIVE
Glucose, UA: 150 mg/dL — AB
KETONES UR: NEGATIVE mg/dL
Leukocytes, UA: NEGATIVE
Nitrite: NEGATIVE
PH: 8 (ref 5.0–8.0)
PROTEIN: 100 mg/dL — AB
SQUAMOUS EPITHELIAL / LPF: NONE SEEN
Specific Gravity, Urine: 1.014 (ref 1.005–1.030)

## 2015-12-18 LAB — BASIC METABOLIC PANEL
Anion gap: 7 (ref 5–15)
BUN: 42 mg/dL — AB (ref 6–20)
CALCIUM: 7 mg/dL — AB (ref 8.9–10.3)
CHLORIDE: 118 mmol/L — AB (ref 101–111)
CO2: 28 mmol/L (ref 22–32)
CREATININE: 1.91 mg/dL — AB (ref 0.61–1.24)
GFR calc non Af Amer: 34 mL/min — ABNORMAL LOW (ref >60)
GFR, EST AFRICAN AMERICAN: 39 mL/min — AB (ref >60)
GLUCOSE: 267 mg/dL — AB (ref 65–99)
Potassium: 3.4 mmol/L — ABNORMAL LOW (ref 3.5–5.1)
Sodium: 153 mmol/L — ABNORMAL HIGH (ref 135–145)

## 2015-12-18 LAB — PROCALCITONIN: PROCALCITONIN: 1.84 ng/mL

## 2015-12-18 LAB — GLUCOSE, CAPILLARY
GLUCOSE-CAPILLARY: 230 mg/dL — AB (ref 65–99)
GLUCOSE-CAPILLARY: 180 mg/dL — AB (ref 65–99)
GLUCOSE-CAPILLARY: 168 mg/dL — AB (ref 65–99)
Glucose-Capillary: 225 mg/dL — ABNORMAL HIGH (ref 65–99)
Glucose-Capillary: 238 mg/dL — ABNORMAL HIGH (ref 65–99)
Glucose-Capillary: 247 mg/dL — ABNORMAL HIGH (ref 65–99)

## 2015-12-18 LAB — SODIUM
SODIUM: 153 mmol/L — AB (ref 135–145)
SODIUM: 151 mmol/L — AB (ref 135–145)
SODIUM: 150 mmol/L — AB (ref 135–145)
Sodium: 152 mmol/L — ABNORMAL HIGH (ref 135–145)

## 2015-12-18 LAB — MAGNESIUM
Magnesium: 2 mg/dL (ref 1.7–2.4)
Magnesium: 1.9 mg/dL (ref 1.7–2.4)

## 2015-12-18 LAB — PHOSPHORUS: Phosphorus: 2.8 mg/dL (ref 2.5–4.6)

## 2015-12-18 MED ORDER — PRO-STAT SUGAR FREE PO LIQD
30.0000 mL | Freq: Every day | ORAL | Status: DC
  Administered 2015-12-19 – 2015-12-20 (×2): 30 mL

## 2015-12-18 MED ORDER — VANCOMYCIN HCL IN DEXTROSE 1-5 GM/200ML-% IV SOLN
1000.0000 mg | INTRAVENOUS | Status: DC
  Filled 2015-12-18: qty 200

## 2015-12-18 MED ORDER — IBUPROFEN 100 MG/5ML PO SUSP
400.0000 mg | Freq: Four times a day (QID) | ORAL | Status: DC | PRN
  Administered 2015-12-18: 400 mg via ORAL
  Filled 2015-12-18 (×3): qty 20

## 2015-12-18 MED ORDER — DEXTROSE 5 % IV SOLN
1.0000 g | Freq: Two times a day (BID) | INTRAVENOUS | Status: DC
  Administered 2015-12-18 – 2015-12-19 (×2): 1 g via INTRAVENOUS
  Filled 2015-12-18 (×3): qty 1

## 2015-12-18 MED ORDER — POTASSIUM CL IN DEXTROSE 5% 20 MEQ/L IV SOLN
20.0000 meq | INTRAVENOUS | Status: DC
  Filled 2015-12-18 (×2): qty 1000

## 2015-12-18 MED ORDER — METRONIDAZOLE IN NACL 5-0.79 MG/ML-% IV SOLN
500.0000 mg | Freq: Three times a day (TID) | INTRAVENOUS | Status: DC
  Administered 2015-12-18 – 2015-12-19 (×4): 500 mg via INTRAVENOUS
  Filled 2015-12-18 (×5): qty 100

## 2015-12-18 MED ORDER — POTASSIUM CHLORIDE 20 MEQ PO PACK
40.0000 meq | PACK | Freq: Once | ORAL | Status: AC
  Administered 2015-12-18: 40 meq via ORAL
  Filled 2015-12-18: qty 2

## 2015-12-18 MED ORDER — POTASSIUM CHLORIDE IN NACL 20-0.45 MEQ/L-% IV SOLN
INTRAVENOUS | Status: DC
  Administered 2015-12-18 – 2015-12-20 (×4): via INTRAVENOUS
  Filled 2015-12-18 (×8): qty 1000

## 2015-12-18 MED ORDER — SENNOSIDES-DOCUSATE SODIUM 8.6-50 MG PO TABS: 2.0000 | ORAL_TABLET | Freq: Two times a day (BID) | ORAL | Status: DC

## 2015-12-18 NOTE — Progress Notes (Signed)
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 Now with acute respiratory failure, intubated due to pneumonia.  ASSESSMENT / PLAN:  PULMONARY A: Acute hypoxic respiratory failure Suspect pneumonia with sepsis. Review of most recent chest x-ray films shows right infiltrates suspicious for pneumonia.  Intubated 2/11>>2/18 Reintubated 2/21>>  P:   -Continue broad-spectrum antibiotics, recheck blood, sputum and urine cultures. -This patient was just intubated yesterday.  we will check a wakeup assessment to assess mental status.  CARDIOVASCULAR-NSTEMI A:  Status post NSTEMI Afib Short runs of V-tach P:  -Continue amiodarone maintenance dose. -Cardiology following - rec low dose ASA -When necessary, metoprolol, aspirin suppository.  RENAL A:   AKI secondary procedural IV dye and volume depletion; baseline creatinine 0.83 P:   -IVF, advance diet.  -Trend creatinine - currently improving -Renally dose meds -nephro following - vascath placed removed 02/19.   GASTROINTESTINAL A:   Pt now having diarrhea, (was receiving bowel regimen for ileus).  P:   -PPI for GI prophylaxis -Reduce bowel agents.  -continue tube feeds.   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  Fever, leukocytosis, likely due to septic shock.  P:   -Antibiotics as above -F/U cultures of blood, urine. --Persistent fevers; consulted ID for persistent fever and leukocytosis.   CULTURES: 02/11>Blood>NTD 02/11>Sputum>STAPHYLOCOCCUS AUREUS 02/18 Blood x2>> negative 02/18 Urine>> negative 02/18 Respiratory>> yeast 2/20, catheter tip culture >> negative thus far 2/21. Urine culture>> Sputum culture 2/21>> Repeat MRSA 2/22  ANTIBIOTICS:  Ceftaz 2/21>> Vancomycin 2/21>>  LINES:  -Urethral catheter, 7 days.  -RIJ, 11 days.   ENDOCRINE A:   Type 2 diabetes mellitus  With hyperglycemia.  P:   -Blood glucose monitoring with SSI coverage -continue long-acting insulin increased today.  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 H/O Seizures-Not on home seizure meds  Encephalopathy secondary to hypercapnia and likely due to recent CVA.  CT head images and report reviewed, continued bilateral cerebellar infarcts with atrophy.  P:   Routine supportive care. Wakeup assessment to assess mental status. His prognosis appears poor given severity of CVA as well as other comorbidities.     SOCIAL:  Prognosis appears poor given multiple problems as well as CVA. If continued aggressive care is desired, will need trach/peg/ placement. Updated the son regarding the patient's care, including the CT head findings, which showed continued stroke. I informed him that the patient will be unlikely to come off the ventilator, we'll likely require a tracheostomy and a feeding tube as well as placement in a LTAC. He would likely pass away, and that state and I'm doubtful that he will be able to be weaned from the ventilator nor expect any meaningful recovery. I asked for decision regarding placement of a tracheostomy. He informed me that he would provide  a  decision later today, he asked that I updated the patient's daughter, [name], I called her number, but it went to voicemail.    STUDIES:  02/12: 2-D NWGN>56-21%; grade 1 diastolic dysfunction, LVH    SIGNIFICANT EVENTS: 02/07>ED with SOB and left calf pain>treated and released 02/09>ED with cold left foot and calf pain>CT angio>complete occlusion of the left fem-pop bypass graft>admitted 02/11>OR for angiogram with thrombolysis, thrombectomy of left peroneal artery, tibioperoneal trunk, popliteal artery, and femoral to popliteal bypass graft and percutaneous transluminal angioplasty of the left peroneal artery and distal bypass anastomosis and popliteal artery Post-op: residual thrombosis of femoral to popliteal bypass graft and all tibial vessels; acute respiratory failure>Intubated  ------------------------------------------   SUBJECTIVE:  The patient was transferred to the floor status on 2/19. However, he was required BiPAP overnight, and his sats dropped, with continued fevers and leukocytosis.  VITAL SIGNS: BP 101/55 mmHg  Pulse 79  Temp(Src) 99.1 F (37.3 C) (Oral)  Resp 21  Ht '6\' 4"'$  (1.93 m)  Wt 158 lb 11.7 oz (72 kg)  BMI 19.33 kg/m2  SpO2 100%  HEMODYNAMICS:    VENTILATOR SETTINGS: Vent Mode:  [-] PRVC FiO2 (%):  [35 %] 35 % Set Rate:  [15 bmp] 15 bmp Vt Set:  [500 mL] 500 mL PEEP:  [5 cmH20] 5 cmH20 Plateau Pressure:  [12 cmH20] 12 cmH20  INTAKE / OUTPUT: I/O last 3 completed shifts: In: 5361.9 [I.V.:2154.4; NG/GT:2957.5; IV Piggyback:250] Out: 3086 [Urine:4675]  PHYSICAL EXAMINATION: General: Frail looking male, NAD Neuro: Moves upper extremities to command HEENT: PERRLA, oral mucosa pink, ET tube in place. Cardiovascular: Afib, no MRG Lungs:  bilateral airflow, scattered rhonchi/rales in all lung fields. Decreased air entry bilaterally. Abdomen: Soft, distended; hypoactive bowl sounds Musculoskeletal:  Bilateral AKA; left stump with  dressing Extremities: +femoral pulse Skin: Warm; no rash  LABS:  BMET  Recent Labs Lab 12/16/15 0440 12/17/15 0421  12/17/15 2222 12/18/15 0244 12/18/15 0526  NA 151* 154*  < > 154* 152* 153*  153*  K 3.5 2.6*  --   --   --  3.4*  CL 118* 116*  --   --   --  118*  CO2 21* 27  --   --   --  28  BUN 60* 52*  --   --   --  42*  CREATININE 2.42* 2.40*  --   --   --  1.91*  GLUCOSE 229* 232*  --   --   --  267*  < > = values in this interval not displayed.  Electrolytes  Recent Labs Lab 12/15/15 0500 12/16/15 0440 12/17/15 0421 12/18/15 0526  CALCIUM 7.3* 7.4* 7.3* 7.0*  MG 2.0 2.2 2.0 2.0  PHOS 3.7  --  3.3 2.8    CBC  Recent Labs Lab 12/15/15 0500 12/16/15 0440 12/17/15 0421  WBC 19.1* 28.5* 36.9*  HGB 8.1* 8.4* 8.4*  HCT 24.4* 25.8* 26.7*  PLT 213 204 190    Coag's No results for input(s): APTT, INR in the last 168 hours.  Sepsis Markers  Recent Labs Lab 12/16/15 0519 12/17/15 0421 12/18/15 0526  LATICACIDVEN 2.5*  --   --   PROCALCITON 3.52 4.32 1.84    ABG  Recent Labs Lab 12/16/15 1430 12/17/15 0445 12/18/15 0500  PHART 7.48* 7.52* 7.39  PCO2ART 36 34 45  PO2ART 83 58* 81*    Liver Enzymes No results for input(s): AST, ALT, ALKPHOS, BILITOT, ALBUMIN in the last 168 hours.  Cardiac Enzymes  Recent Labs Lab 12/14/15 0213 12/14/15 0809 12/16/15 0440  TROPONINI 2.67* 3.00* 2.70*    Glucose  Recent Labs Lab 12/17/15 1225 12/17/15 1616 12/17/15 2002 12/17/15 2348 12/18/15 0408 12/18/15 0748  GLUCAP 274* 205* 169* 225* 247* 230*    Imaging Dg Chest 1 View  12/18/2015  CLINICAL DATA:  Shortness of breath. EXAM: CHEST 1 VIEW COMPARISON:  12/17/2015. FINDINGS: Endotracheal tube, right IJ line, NG tube in stable position. Prior CABG. Persistent but improving bilateral pulmonary infiltrates consistent with improving congestive heart failure. Bilateral pneumonia cannot be excluded. Small left pleural effusion. No  pneumothorax. IMPRESSION: 1. Lines and tubes in stable position. 2. Prior CABG. Stable cardiomegaly. Persistent but slightly improving bilateral pulmonary infiltrates and left pleural effusion. Findings consistent with improving congestive heart failure. Electronically Signed   By: Marcello Moores  Register   On: 12/18/2015 07:05   Ct Head Wo Contrast  12/17/2015  CLINICAL DATA:  Recent nonhemorrhagic infarct involving the right PCA territory and bilateral cerebellum, left greater than right. EXAM: CT HEAD WITHOUT CONTRAST TECHNIQUE: Contiguous axial images were obtained from the base of the skull through the vertex without intravenous contrast. COMPARISON:  12/07/2015 FINDINGS: Interval evolution of bilateral cerebellar infarcts now shows the right cerebellar infarct to the larger than that on the left. No evidence for interval progression on either side. No evidence for hemorrhagic transformation. The cytotoxic edema is seen in the right PCA territory on the previous study shows continued evolution with more noticeable low-density in this territory today. No evidence for associated hemorrhage. No evidence for new acute infarct. No evidence for hydrocephalus or midline shift. Diffuse loss of parenchymal volume is consistent with atrophy. Patchy low attenuation in the deep hemispheric and periventricular white matter is nonspecific, but likely reflects chronic microvascular ischemic demyelination. The visualized paranasal sinuses and mastoid air cells are clear. IMPRESSION: 1. 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 2. No new or progressive findings. 3. Atrophy with chronic small vessel white matter ischemic disease. Electronically Signed   By: Misty Stanley M.D.   On: 12/17/2015 18:42      Deep Ashby Dawes, M.D.   Critical Care Attestation.  I have personally obtained a history, examined the patient, evaluated laboratory and imaging results, formulated the assessment and plan  and placed orders. The Patient requires high complexity decision making for assessment and support, frequent evaluation and titration of therapies, application of advanced monitoring technologies and extensive interpretation of multiple databases. The patient has critical illness that could lead imminently to failure of 1 or more organ systems and requires the highest level of physician preparedness to intervene.  Critical Care Time devoted to patient care services described in this note is 35 minutes and is exclusive of time spent in procedures.

## 2015-12-18 NOTE — Progress Notes (Signed)
Withdraws to pain. Propofol was titrated down to 38mg/kg/min once and patient became tachypnic with mild increased work of breathing therefore propofol titrated back to 544m/kg/min.  NSR with PVCs per cardiac monitor. Minimal output from rectal tube.  Excellent urine output.

## 2015-12-18 NOTE — Progress Notes (Signed)
Pharmacy Antibiotic Note  Stephen Camacho is a 70 y.o. male Started on abx 12/16/15 for pneumonia.  Pharmacy has been consulted for vancomycin and ceftazidime dosing.  Plan: Will transition patient to vancomycin '1000mg'$  IV Q24hr. Will follow serum creatinine on 2/24 and reschedule trough.   Will transition patient to ceftazidime 1 gram q 12 hours.  Height: '6\' 4"'$  (193 cm) Weight:  (bed scale does not work. plan ops notified. ) IBW/kg (Calculated) : 86.8  Temp (24hrs), Avg:100.8 F (38.2 C), Min:99.1 F (37.3 C), Max:102 F (38.9 C)   Recent Labs Lab 12/13/15 1701 12/14/15 0213 12/15/15 0500 12/16/15 0440 12/16/15 0519 12/17/15 0421 12/18/15 0526  WBC 19.5* 17.8* 19.1* 28.5*  --  36.9*  --   CREATININE  --  2.02* 2.37* 2.42*  --  2.40* 1.91*  LATICACIDVEN  --   --   --   --  2.5*  --   --     Estimated Creatinine Clearance: 36.6 mL/min (by C-G formula based on Cr of 1.91).    No Known Allergies  Antimicrobials this admission: Vancomycin 2/21 >> Ceftazidime 2/21 >> Metronidazole 2/23 >>   Microbiology results: 2/18 BCx: NGTD 2/18 UCx: NGTD  2/18 Sputum: Yeast (light), no Gram stain 2/21 Sputum: holding for possible pathogen  MRSA PCR: (-)  Pharmacy will continue to monitor and adjust per consult.    Simpson,Michael L 12/18/2015 4:15 PM

## 2015-12-18 NOTE — Progress Notes (Signed)
 Vein & Vascular Surgery  Daily Progress Note   Subjective: 6 Days Post-Op: left above-the-knee amputation  Patient intubated. Sedated. No acute distress.   Objective: Filed Vitals:   12/18/15 0400 12/18/15 0500 12/18/15 0600 12/18/15 0700  BP: 109/54 108/55 106/57 101/55  Pulse: 86 85 83 79  Temp:  99.5 F (37.5 C)  99.1 F (37.3 C)  TempSrc:  Oral    Resp: '22 19 21 21  '$ Height:      Weight:      SpO2: 99% 100% 99% 100%    Intake/Output Summary (Last 24 hours) at 12/18/15 2263 Last data filed at 12/18/15 0600  Gross per 24 hour  Intake 4217.44 ml  Output   3525 ml  Net 692.44 ml    Physical Exam: Sedated on vent. Unable to answer questions.  CV: RRR Pulmonary: Course Abdomen: Soft, Nontender, Nondistended Vascular: Left Lower Extremity: Staples clean , dry and intact. No drainage. Stump edematous.   Laboratory: CBC    Component Value Date/Time   WBC 36.9* 12/17/2015 0421   WBC 4.9 10/25/2014 1331   HGB 8.4* 12/17/2015 0421   HGB 12.1* 10/25/2014 1331   HCT 26.7* 12/17/2015 0421   HCT 38.1* 10/25/2014 1331   PLT 190 12/17/2015 0421   PLT 151 10/25/2014 1331    BMET    Component Value Date/Time   NA 153* 12/18/2015 0526   NA 153* 12/18/2015 0526   NA 138 10/25/2014 1331   K 3.4* 12/18/2015 0526   K 3.9 10/25/2014 1331   CL 118* 12/18/2015 0526   CL 109* 10/25/2014 1331   CO2 28 12/18/2015 0526   CO2 21 10/25/2014 1331   GLUCOSE 267* 12/18/2015 0526   GLUCOSE 232* 10/25/2014 1331   BUN 42* 12/18/2015 0526   BUN 17 10/25/2014 1331   CREATININE 1.91* 12/18/2015 0526   CREATININE 1.11 10/25/2014 1331   CALCIUM 7.0* 12/18/2015 0526   CALCIUM 8.7 10/25/2014 1331   GFRNONAA 34* 12/18/2015 0526   GFRNONAA >60 10/25/2014 1331   GFRNONAA 52* 07/08/2014 1051   GFRAA 39* 12/18/2015 0526   GFRAA >60 10/25/2014 1331   GFRAA >60 07/08/2014 1051    Assessment/Planning: 70 year old male s/p 6 Days Post-Op: left above-the-knee amputation  transferred back to ICU for respiratory distress, now intubated. 1) Stump healing well 2) Care as per critical care team  [facility] PA-C 12/18/2015 9:22 AM

## 2015-12-18 NOTE — Consult Note (Signed)
[facility] Infectious Disease     Reason for Consult:Fever, leukocytosis   Referring Physician: Michae Kava Date of Admission:  12/05/2015   Active Problems:   Ischemic leg   Acute respiratory failure ([place])   Ischemia of lower extremity   Altered mental status   Hematoma of neck   Occlusion of arterial bypass graft (HCC)   NSTEMI (non-ST elevated myocardial infarction) ([place])   Severe sepsis ([place])   Urinary retention   Aspiration into respiratory tract   Encounter for intubation    HPI: Stephen Camacho is a 70 y.o. male with hx PAD, CAD, DM, admitted with L foot pain and found to have ischemia and occlusion of Fem Pop bypass. Has been admitted for 13 days. Underwent angiogram 2/10 but needed  BKA 2/17. Has had persistent fevers and leukocytosis Course complicated by CVA, ARF, MI. Has been on abx since admit with zosyn and vanco from 2/11- 2/19.  Was off abx for one day 2/20 there restarted ceftazidime and vancol.  Cultures with sputum growing MSSA from 2/11 otherwise negative  Past Medical History  Diagnosis Date  . Hyperlipidemia   . Hypertension   . Diabetes mellitus ([place])   . CAD (coronary artery disease)     a. s/p 2 v CABG in 2012 (LIMA-LAD & SVG-OM); b. cath 07/2012 s/p PCI/DES to ostial LCx and OM  . PAD (peripheral artery disease) ([place])     a. s/p right SFA stent; b. s/p right toe amputation 2011; c. s/p right AKA spring 2016  . Seizures ([place])   . Gangrene of foot ([place])   . Wheezing   . Lung cancer ([place])   . Prostate cancer ([place])   . Stroke [facility])    Past Surgical History  Procedure Laterality Date  . Right aka  07-10-2014  . Left femoral popliteal bypass    . Right lung lobeectomy    . Coronary artery bypass graft    . Peripheral vascular catheterization Left 12/06/2015    Procedure: Lower Extremity Angiography;  Surgeon: [name], MD;  Location: [place] [facility];  Service: Cardiovascular;  Laterality: Left;  . Peripheral vascular catheterization Left  12/06/2015    Procedure: Lower Extremity Intervention;  Surgeon: [name], MD;  Location: [place] [facility];  Service: Cardiovascular;  Laterality: Left;  . Peripheral vascular catheterization N/A 12/05/2015    Procedure: Lower Extremity Angiography;  Surgeon: [name], MD;  Location: [place] [facility];  Service: Cardiovascular;  Laterality: N/A;  . Peripheral vascular catheterization  12/05/2015    Procedure: Lower Extremity Intervention;  Surgeon: [name], MD;  Location: [place] [facility];  Service: Cardiovascular;;  . Amputation Left 12/12/2015    Procedure: AMPUTATION ABOVE KNEE;  Surgeon: [name], MD;  Location: [facility] OR[facility];  Service: Vascular;  Laterality: Left;   Social History  Substance Use Topics  . Smoking status: Never Smoker   . Smokeless tobacco: None  . Alcohol Use: No   Family History  Problem Relation Age of Onset  . Family history unknown: Yes    Allergies: No Known Allergies  Current antibiotics: Antibiotics Given (last 72 hours)    Date/Time Action Medication Dose Rate   12/17/15 0246 Given   vancomycin (VANCOCIN) 1,250 mg in sodium chloride 0.9 % 250 mL IVPB 1,250 mg 166.7 mL/hr   12/17/15 0941 Given   cefTAZidime (FORTAZ) 1 g in dextrose 5 % 50 mL IVPB 1 g 100 mL/hr   12/18/15 0909  Given   cefTAZidime (FORTAZ) 1 g in dextrose 5 % 50 mL IVPB 1 g 100 mL/hr   12/18/15 1313 Given   vancomycin (VANCOCIN) 1,250 mg in sodium chloride 0.9 % 250 mL IVPB 1,250 mg 166.7 mL/hr      MEDICATIONS: . amiodarone  150 mg Intravenous Once  . amiodarone  200 mg Oral BID  . antiseptic oral rinse  7 mL Mouth Rinse QID  . aspirin  162 mg Oral Daily  . cefTAZidime (FORTAZ)  IV  1 g Intravenous Once  . cefTAZidime (FORTAZ)  IV  1 g Intravenous Q24H  . chlorhexidine gluconate  15 mL Mouth Rinse BID  . feeding supplement (PRO-STAT SUGAR FREE 64)  30 mL Per Tube TID  . free water  200 mL Per Tube Q4H  . heparin subcutaneous  5,000 Units  Subcutaneous Q12H  . insulin aspart  0-20 Units Subcutaneous 6 times per day  . insulin glargine  18 Units Subcutaneous Daily  . metoprolol  2.5 mg Intravenous 4 times per day  . pantoprazole sodium  40 mg Per Tube Daily  . sodium chloride  500 mL Intravenous Once  . vancomycin  1,250 mg Intravenous Once  . vancomycin  1,250 mg Intravenous Q36H    Review of Systems - intubated  OBJECTIVE: Temp:  [99.1 F (37.3 C)-102 F (38.9 C)] 99.1 F (37.3 C) (02/23 0700) Pulse Rate:  [79-94] 85 (02/23 1200) Resp:  [19-26] 23 (02/23 1200) BP: (94-132)/(53-69) 116/60 mmHg (02/23 1200) SpO2:  [97 %-100 %] 99 % (02/23 1200) FiO2 (%):  [35 %] 35 % (02/23 1100) Physical Exam  Constitutional: intubated, critically ill appearing HENT: anicteric Mouth/Throat: Oropharynx is clear and moist. No oropharyngeal exudate.  R neck IJ Cardiovascular: Normal rate, regular rhythm and normal heart sounds. Pulmonary/Chest: mech BS Abdominal: Soft. Bowel sounds are normal. He exhibits no distension. There is no tenderness.  Lymphadenopathy: He has no cervical adenopathy.  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 No rash noted. No erythema.  Psychiatric: intubated   LABS: Results for orders placed or performed during the hospital encounter of 12/05/15 (from the past 48 hour(s))  Triglycerides     Status: None   Collection Time: 12/16/15  1:59 PM  Result Value Ref Range   Triglycerides 110 <150 mg/dL  Blood gas, arterial     Status: Abnormal   Collection Time: 12/16/15  2:30 PM  Result Value Ref Range   FIO2 0.40    Delivery systems VENTILATOR    Mode PRESSURE REGULATED VOLUME CONTROL    VT 500 mL   LHR 15 resp/min   Peep/cpap 5.0 cm H20   pH, Arterial 7.48 (H) 7.350 - 7.450   pCO2 arterial 36 32.0 - 48.0 mmHg   pO2, Arterial 83 83.0 - 108.0 mmHg   Bicarbonate 26.8 21.0 - 28.0 mEq/L   Acid-Base Excess 3.2 (H) 0.0 - 3.0 mmol/L   O2 Saturation  96.9 %   Patient temperature 37.0    Collection site RIGHT RADIAL    Sample type ARTERIAL DRAW    Allens test (pass/fail) POSITIVE (A) PASS   Mechanical Rate 15   Glucose, capillary     Status: Abnormal   Collection Time: 12/16/15  3:50 PM  Result Value Ref Range   Glucose-Capillary 209 (H) 65 - 99 mg/dL  C difficile quick scan w PCR reflex     Status: None   Collection Time: 12/16/15  4:26 PM  Result Value Ref Range   C Diff antigen NEGATIVE NEGATIVE   C Diff toxin NEGATIVE NEGATIVE   C Diff interpretation Negative for C. difficile   Culture, expectorated sputum-assessment     Status: None   Collection Time: 12/16/15  5:32 PM  Result Value Ref Range   Specimen Description SPUTUM    Special Requests NONE    Sputum evaluation THIS SPECIMEN IS ACCEPTABLE FOR SPUTUM CULTURE    Report Status 12/16/2015 FINAL   Culture, respiratory (NON-Expectorated)     Status: None (Preliminary result)   Collection Time: 12/16/15  5:32 PM  Result Value Ref Range   Specimen Description SPUTUM    Special Requests NONE Reflexed from [id number]    Gram Stain      MANY WBC SEEN RARE SQUAMOUS EPITHELIAL CELLS PRESENT FEW YEAST EXCELLENT SPECIMEN - 90-100% WBCS    Culture HOLDING FOR POSSIBLE PATHOGEN    Report Status PENDING   Glucose, capillary     Status: Abnormal   Collection Time: 12/16/15  7:39 PM  Result Value Ref Range   Glucose-Capillary 154 (H) 65 - 99 mg/dL  Glucose, capillary     Status: Abnormal   Collection Time: 12/17/15 12:02 AM  Result Value Ref Range   Glucose-Capillary 168 (H) 65 - 99 mg/dL  Glucose, capillary     Status: Abnormal   Collection Time: 12/17/15  4:02 AM  Result Value Ref Range   Glucose-Capillary 225 (H) 65 - 99 mg/dL  Procalcitonin     Status: None   Collection Time: 12/17/15  4:21 AM  Result Value Ref Range   Procalcitonin 4.32 ng/mL    Comment:        Interpretation: PCT > 2 ng/mL: Systemic infection (sepsis) is likely, unless other causes are  known. (NOTE)         ICU PCT Algorithm               Non ICU PCT Algorithm    ----------------------------     ------------------------------         PCT < 0.25 ng/mL                 PCT < 0.1 ng/mL     Stopping of antibiotics            Stopping of antibiotics       strongly encouraged.               strongly encouraged.    ----------------------------     ------------------------------       PCT level decrease by               PCT < 0.25 ng/mL       >= 80% from peak PCT       OR PCT 0.25 - 0.5 ng/mL          Stopping of antibiotics                                             encouraged.     Stopping of antibiotics           encouraged.    ----------------------------     ------------------------------       PCT level decrease by              PCT >= 0.25 ng/mL       <  80% from peak PCT        AND PCT >= 0.5 ng/mL            Continuing antibiotics                                               encouraged.       Continuing antibiotics            encouraged.    ----------------------------     ------------------------------     PCT level increase compared          PCT > 0.5 ng/mL         with peak PCT AND          PCT >= 0.5 ng/mL             Escalation of antibiotics                                          strongly encouraged.      Escalation of antibiotics        strongly encouraged.   CBC     Status: Abnormal   Collection Time: 12/17/15  4:21 AM  Result Value Ref Range   WBC 36.9 (H) 3.8 - 10.6 K/uL   RBC 3.07 (L) 4.40 - 5.90 MIL/uL   Hemoglobin 8.4 (L) 13.0 - 18.0 g/dL   HCT 26.7 (L) 40.0 - 52.0 %   MCV 86.9 80.0 - 100.0 fL   MCH 27.4 26.0 - 34.0 pg   MCHC 31.6 (L) 32.0 - 36.0 g/dL   RDW 16.4 (H) 11.5 - 14.5 %   Platelets 190 150 - 440 K/uL  Basic metabolic panel     Status: Abnormal   Collection Time: 12/17/15  4:21 AM  Result Value Ref Range   Sodium 154 (H) 135 - 145 mmol/L   Potassium 2.6 (LL) 3.5 - 5.1 mmol/L    Comment: CRITICAL RESULT CALLED TO, READ BACK BY  AND VERIFIED WITH FELICIA PREUDHOMME ON 06/11/28 0515 [place]    Chloride 116 (H) 101 - 111 mmol/L   CO2 27 22 - 32 mmol/L   Glucose, Bld 232 (H) 65 - 99 mg/dL   BUN 52 (H) 6 - 20 mg/dL   Creatinine, Ser 2.40 (H) 0.61 - 1.24 mg/dL   Calcium 7.3 (L) 8.9 - 10.3 mg/dL   GFR calc non Af Amer 26 (L) >60 mL/min   GFR calc Af Amer 30 (L) >60 mL/min    Comment: (NOTE) The eGFR has been calculated using the CKD EPI equation. This calculation has not been validated in all clinical situations. eGFR's persistently <60 mL/min signify possible Chronic Kidney Disease.    Anion gap 11 5 - 15  Magnesium     Status: None   Collection Time: 12/17/15  4:21 AM  Result Value Ref Range   Magnesium 2.0 1.7 - 2.4 mg/dL  Phosphorus     Status: None   Collection Time: 12/17/15  4:21 AM  Result Value Ref Range   Phosphorus 3.3 2.5 - 4.6 mg/dL  Blood gas, arterial     Status: Abnormal   Collection Time: 12/17/15  4:45 AM  Result Value Ref Range   FIO2 0.30    Delivery systems VENTILATOR  Mode PRESSURE REGULATED VOLUME CONTROL    VT 500 mL   Peep/cpap 5.0 cm H20   pH, Arterial 7.52 (H) 7.350 - 7.450   pCO2 arterial 34 32.0 - 48.0 mmHg   pO2, Arterial 58 (L) 83.0 - 108.0 mmHg   Bicarbonate 27.8 21.0 - 28.0 mEq/L   Acid-Base Excess 5.1 (H) 0.0 - 3.0 mmol/L   O2 Saturation 92.6 %   Patient temperature 37.0    Collection site RIGHT RADIAL    Sample type ARTERIAL DRAW    Allens test (pass/fail) POSITIVE (A) PASS   Mechanical Rate 15   Glucose, capillary     Status: Abnormal   Collection Time: 12/17/15  7:46 AM  Result Value Ref Range   Glucose-Capillary 226 (H) 65 - 99 mg/dL  Sodium     Status: Abnormal   Collection Time: 12/17/15 10:02 AM  Result Value Ref Range   Sodium 155 (H) 135 - 145 mmol/L  MRSA PCR Screening     Status: None   Collection Time: 12/17/15 11:50 AM  Result Value Ref Range   MRSA by PCR NEGATIVE NEGATIVE    Comment:        The GeneXpert MRSA Assay (FDA approved for NASAL  specimens only), is one component of a comprehensive MRSA colonization surveillance program. It is not intended to diagnose MRSA infection nor to guide or monitor treatment for MRSA infections.   Glucose, capillary     Status: Abnormal   Collection Time: 12/17/15 12:25 PM  Result Value Ref Range   Glucose-Capillary 274 (H) 65 - 99 mg/dL  Glucose, capillary     Status: Abnormal   Collection Time: 12/17/15  4:16 PM  Result Value Ref Range   Glucose-Capillary 205 (H) 65 - 99 mg/dL  Glucose, capillary     Status: Abnormal   Collection Time: 12/17/15  8:02 PM  Result Value Ref Range   Glucose-Capillary 169 (H) 65 - 99 mg/dL  Sodium     Status: Abnormal   Collection Time: 12/17/15 10:22 PM  Result Value Ref Range   Sodium 154 (H) 135 - 145 mmol/L  Glucose, capillary     Status: Abnormal   Collection Time: 12/17/15 11:48 PM  Result Value Ref Range   Glucose-Capillary 225 (H) 65 - 99 mg/dL  Urinalysis complete, with microscopic ([facility] only)     Status: Abnormal   Collection Time: 12/18/15 12:24 AM  Result Value Ref Range   Color, Urine YELLOW (A) YELLOW   APPearance CLEAR (A) CLEAR   Glucose, UA 150 (A) NEGATIVE mg/dL   Bilirubin Urine NEGATIVE NEGATIVE   Ketones, ur NEGATIVE NEGATIVE mg/dL   Specific Gravity, Urine 1.014 1.005 - 1.030   Hgb urine dipstick 2+ (A) NEGATIVE   pH 8.0 5.0 - 8.0   Protein, ur 100 (A) NEGATIVE mg/dL   Nitrite NEGATIVE NEGATIVE   Leukocytes, UA NEGATIVE NEGATIVE   RBC / HPF TOO NUMEROUS TO COUNT 0 - 5 RBC/hpf   WBC, UA 6-30 0 - 5 WBC/hpf   Bacteria, UA NONE SEEN NONE SEEN   Squamous Epithelial / LPF NONE SEEN NONE SEEN  Sodium     Status: Abnormal   Collection Time: 12/18/15  2:44 AM  Result Value Ref Range   Sodium 152 (H) 135 - 145 mmol/L  Glucose, capillary     Status: Abnormal   Collection Time: 12/18/15  4:08 AM  Result Value Ref Range   Glucose-Capillary 247 (H) 65 - 99 mg/dL  Culture, blood (Routine  X 2) w Reflex to ID Panel     Status:  None (Preliminary result)   Collection Time: 12/18/15  4:29 AM  Result Value Ref Range   Specimen Description BLOOD RIGHT HAND    Special Requests BOTTLES DRAWN AEROBIC AND ANAEROBIC 5ML    Culture NO GROWTH <12 HOURS    Report Status PENDING   Culture, blood (Routine X 2) w Reflex to ID Panel     Status: None (Preliminary result)   Collection Time: 12/18/15  4:37 AM  Result Value Ref Range   Specimen Description BLOOD LEFT ARM    Special Requests BOTTLES DRAWN AEROBIC AND ANAEROBIC 10ML    Culture NO GROWTH <12 HOURS    Report Status PENDING   Blood gas, arterial     Status: Abnormal   Collection Time: 12/18/15  5:00 AM  Result Value Ref Range   FIO2 0.35    Delivery systems VENTILATOR    Mode PRESSURE REGULATED VOLUME CONTROL    VT 500 mL   LHR 15 resp/min   Peep/cpap 5.0 cm H20   pH, Arterial 7.39 7.350 - 7.450   pCO2 arterial 45 32.0 - 48.0 mmHg   pO2, Arterial 81 (L) 83.0 - 108.0 mmHg   Bicarbonate 27.2 21.0 - 28.0 mEq/L   Acid-Base Excess 1.8 0.0 - 3.0 mmol/L   O2 Saturation 95.8 %   Patient temperature 37.0    Collection site RIGHT RADIAL    Sample type ARTERIAL DRAW    Allens test (pass/fail) POSITIVE (A) PASS  Sodium     Status: Abnormal   Collection Time: 12/18/15  5:26 AM  Result Value Ref Range   Sodium 153 (H) 135 - 145 mmol/L  Procalcitonin     Status: None   Collection Time: 12/18/15  5:26 AM  Result Value Ref Range   Procalcitonin 1.84 ng/mL    Comment:        Interpretation: PCT > 0.5 ng/mL and <= 2 ng/mL: Systemic infection (sepsis) is possible, but other conditions are known to elevate PCT as well. (NOTE)         ICU PCT Algorithm               Non ICU PCT Algorithm    ----------------------------     ------------------------------         PCT < 0.25 ng/mL                 PCT < 0.1 ng/mL     Stopping of antibiotics            Stopping of antibiotics       strongly encouraged.               strongly encouraged.    ----------------------------      ------------------------------       PCT level decrease by               PCT < 0.25 ng/mL       >= 80% from peak PCT       OR PCT 0.25 - 0.5 ng/mL          Stopping of antibiotics                                             encouraged.     Stopping of antibiotics  encouraged.    ----------------------------     ------------------------------       PCT level decrease by              PCT >= 0.25 ng/mL       < 80% from peak PCT        AND PCT >= 0.5 ng/mL             Continuing antibiotics                                              encouraged.       Continuing antibiotics            encouraged.    ----------------------------     ------------------------------     PCT level increase compared          PCT > 0.5 ng/mL         with peak PCT AND          PCT >= 0.5 ng/mL             Escalation of antibiotics                                          strongly encouraged.      Escalation of antibiotics        strongly encouraged.   Magnesium     Status: None   Collection Time: 12/18/15  5:26 AM  Result Value Ref Range   Magnesium 2.0 1.7 - 2.4 mg/dL  Basic metabolic panel     Status: Abnormal   Collection Time: 12/18/15  5:26 AM  Result Value Ref Range   Sodium 153 (H) 135 - 145 mmol/L   Potassium 3.4 (L) 3.5 - 5.1 mmol/L   Chloride 118 (H) 101 - 111 mmol/L   CO2 28 22 - 32 mmol/L   Glucose, Bld 267 (H) 65 - 99 mg/dL   BUN 42 (H) 6 - 20 mg/dL   Creatinine, Ser 1.91 (H) 0.61 - 1.24 mg/dL   Calcium 7.0 (L) 8.9 - 10.3 mg/dL   GFR calc non Af Amer 34 (L) >60 mL/min   GFR calc Af Amer 39 (L) >60 mL/min    Comment: (NOTE) The eGFR has been calculated using the CKD EPI equation. This calculation has not been validated in all clinical situations. eGFR's persistently <60 mL/min signify possible Chronic Kidney Disease.    Anion gap 7 5 - 15  Phosphorus     Status: None   Collection Time: 12/18/15  5:26 AM  Result Value Ref Range   Phosphorus 2.8 2.5 - 4.6 mg/dL  Glucose,  capillary     Status: Abnormal   Collection Time: 12/18/15  7:48 AM  Result Value Ref Range   Glucose-Capillary 230 (H) 65 - 99 mg/dL  Sodium     Status: Abnormal   Collection Time: 12/18/15  9:04 AM  Result Value Ref Range   Sodium 151 (H) 135 - 145 mmol/L  Glucose, capillary     Status: Abnormal   Collection Time: 12/18/15 11:24 AM  Result Value Ref Range   Glucose-Capillary 238 (H) 65 - 99 mg/dL  Sodium     Status: Abnormal   Collection Time: 12/18/15  1:10 PM  Result Value  Ref Range   Sodium 150 (H) 135 - 145 mmol/L  Magnesium     Status: None   Collection Time: 12/18/15  1:10 PM  Result Value Ref Range   Magnesium 1.9 1.7 - 2.4 mg/dL   No components found for: ESR, C REACTIVE PROTEIN MICRO: Recent Results (from the past 720 hour(s))  Culture, respiratory (NON-Expectorated)     Status: None   Collection Time: 12/06/15  4:05 PM  Result Value Ref Range Status   Specimen Description NASAL SWAB  Final   Special Requests NONE  Final   Gram Stain RARE WBC SEEN FEW GRAM POSITIVE COCCI   Final   Culture HEAVY GROWTH STAPHYLOCOCCUS AUREUS  Final   Report Status 12/09/2015 FINAL  Final   Organism ID, Bacteria STAPHYLOCOCCUS AUREUS  Final      Susceptibility   Staphylococcus aureus - MIC*    CIPROFLOXACIN 2 INTERMEDIATE Intermediate     GENTAMICIN <=0.5 SENSITIVE Sensitive     OXACILLIN <=0.25 SENSITIVE Sensitive     TETRACYCLINE <=1 SENSITIVE Sensitive     VANCOMYCIN 1 SENSITIVE Sensitive     TRIMETH/SULFA <=10 SENSITIVE Sensitive     RIFAMPIN <=0.5 SENSITIVE Sensitive     Inducible Clindamycin NEGATIVE Sensitive     * HEAVY GROWTH STAPHYLOCOCCUS AUREUS  Culture, blood (Routine X 2) w Reflex to ID Panel     Status: None   Collection Time: 12/07/15 12:09 AM  Result Value Ref Range Status   Specimen Description BLOOD BLOOD RIGHT HAND  Final   Special Requests BOTTLES DRAWN AEROBIC AND ANAEROBIC 10CC  Final   Culture NO GROWTH 6 DAYS  Final   Report Status 12/13/2015 FINAL   Final  Culture, blood (Routine X 2) w Reflex to ID Panel     Status: None   Collection Time: 12/07/15 12:09 AM  Result Value Ref Range Status   Specimen Description BLOOD BLOOD LEFT HAND  Final   Special Requests BOTTLES DRAWN AEROBIC AND ANAEROBIC 10CC  Final   Culture NO GROWTH 6 DAYS  Final   Report Status 12/13/2015 FINAL  Final  Urine culture     Status: None   Collection Time: 12/07/15 12:11 AM  Result Value Ref Range Status   Specimen Description URINE, CLEAN CATCH  Final   Special Requests NONE  Final   Culture NO GROWTH 1 DAY  Final   Report Status 12/08/2015 FINAL  Final  MRSA PCR Screening     Status: None   Collection Time: 12/08/15  9:12 AM  Result Value Ref Range Status   MRSA by PCR NEGATIVE NEGATIVE Final    Comment:        The GeneXpert MRSA Assay (FDA approved for NASAL specimens only), is one component of a comprehensive MRSA colonization surveillance program. It is not intended to diagnose MRSA infection nor to guide or monitor treatment for MRSA infections.   C difficile quick scan w PCR reflex     Status: None   Collection Time: 12/11/15  1:54 PM  Result Value Ref Range Status   C Diff antigen NEGATIVE NEGATIVE Final   C Diff toxin NEGATIVE NEGATIVE Final   C Diff interpretation Negative for C. difficile  Final  Culture, blood (Routine X 2) w Reflex to ID Panel     Status: None   Collection Time: 12/13/15  9:33 AM  Result Value Ref Range Status   Specimen Description BLOOD LEFT ANTECUBITAL  Final   Special Requests BOTTLES DRAWN AEROBIC AND ANAEROBIC  4CC  Final   Culture NO GROWTH 5 DAYS  Final   Report Status 12/18/2015 FINAL  Final  Culture, blood (Routine X 2) w Reflex to ID Panel     Status: None   Collection Time: 12/13/15  9:41 AM  Result Value Ref Range Status   Specimen Description BLOOD RIGHT ARM  Final   Special Requests BOTTLES DRAWN AEROBIC AND ANAEROBIC  2CC  Final   Culture NO GROWTH 5 DAYS  Final   Report Status 12/18/2015 FINAL   Final  Urine culture     Status: None   Collection Time: 12/13/15 11:01 AM  Result Value Ref Range Status   Specimen Description URINE, CATHETERIZED  Final   Special Requests NONE  Final   Culture NO GROWTH 1 DAY  Final   Report Status 12/14/2015 FINAL  Final  Culture, expectorated sputum-assessment     Status: None   Collection Time: 12/13/15 11:30 AM  Result Value Ref Range Status   Specimen Description TRACHEAL ASPIRATE  Final   Special Requests NONE  Final   Sputum evaluation THIS SPECIMEN IS ACCEPTABLE FOR SPUTUM CULTURE  Final   Report Status 12/13/2015 FINAL  Final  Culture, respiratory (NON-Expectorated)     Status: None   Collection Time: 12/13/15 11:30 AM  Result Value Ref Range Status   Specimen Description TRACHEAL ASPIRATE  Final   Special Requests NONE Reflexed from [id number]  Final   Gram Stain   Final    FEW WBC SEEN FEW GRAM POSITIVE COCCI FEW YEAST FAIR SPECIMEN - 70-80% WBCS    Culture LIGHT GROWTH CANDIDA ALBICANS  Final   Report Status 12/17/2015 FINAL  Final  Cath Tip Culture     Status: None   Collection Time: 12/14/15 12:47 PM  Result Value Ref Range Status   Specimen Description CATH TIP  Final   Special Requests NONE  Final   Culture NO GROWTH 3 DAYS  Final   Report Status 12/17/2015 FINAL  Final  Urine culture     Status: None   Collection Time: 12/16/15  8:19 AM  Result Value Ref Range Status   Specimen Description URINE, RANDOM  Final   Special Requests Normal  Final   Culture NO GROWTH 1 DAY  Final   Report Status 12/17/2015 FINAL  Final  CULTURE, BLOOD (ROUTINE X 2) w Reflex to PCR ID Panel     Status: None (Preliminary result)   Collection Time: 12/16/15  8:28 AM  Result Value Ref Range Status   Specimen Description BLOOD LEFT ARM  Final   Special Requests BOTTLES DRAWN AEROBIC AND ANAEROBIC 10ML  Final   Culture NO GROWTH 2 DAYS  Final   Report Status PENDING  Incomplete  CULTURE, BLOOD (ROUTINE X 2) w Reflex to PCR ID Panel     Status:  None (Preliminary result)   Collection Time: 12/16/15  8:45 AM  Result Value Ref Range Status   Specimen Description BLOOD RIGHT ARM  Final   Special Requests BOTTLES DRAWN AEROBIC AND ANAEROBIC 10ML  Final   Culture NO GROWTH 2 DAYS  Final   Report Status PENDING  Incomplete  C difficile quick scan w PCR reflex     Status: None   Collection Time: 12/16/15  4:26 PM  Result Value Ref Range Status   C Diff antigen NEGATIVE NEGATIVE Final   C Diff toxin NEGATIVE NEGATIVE Final   C Diff interpretation Negative for C. difficile  Final  Culture, expectorated sputum-assessment  Status: None   Collection Time: 12/16/15  5:32 PM  Result Value Ref Range Status   Specimen Description SPUTUM  Final   Special Requests NONE  Final   Sputum evaluation THIS SPECIMEN IS ACCEPTABLE FOR SPUTUM CULTURE  Final   Report Status 12/16/2015 FINAL  Final  Culture, respiratory (NON-Expectorated)     Status: None (Preliminary result)   Collection Time: 12/16/15  5:32 PM  Result Value Ref Range Status   Specimen Description SPUTUM  Final   Special Requests NONE Reflexed from [id number]  Final   Gram Stain   Final    MANY WBC SEEN RARE SQUAMOUS EPITHELIAL CELLS PRESENT FEW YEAST EXCELLENT SPECIMEN - 90-100% WBCS    Culture HOLDING FOR POSSIBLE PATHOGEN  Final   Report Status PENDING  Incomplete  MRSA PCR Screening     Status: None   Collection Time: 12/17/15 11:50 AM  Result Value Ref Range Status   MRSA by PCR NEGATIVE NEGATIVE Final    Comment:        The GeneXpert MRSA Assay (FDA approved for NASAL specimens only), is one component of a comprehensive MRSA colonization surveillance program. It is not intended to diagnose MRSA infection nor to guide or monitor treatment for MRSA infections.   Culture, blood (Routine X 2) w Reflex to ID Panel     Status: None (Preliminary result)   Collection Time: 12/18/15  4:29 AM  Result Value Ref Range Status   Specimen Description BLOOD RIGHT HAND  Final    Special Requests BOTTLES DRAWN AEROBIC AND ANAEROBIC 5ML  Final   Culture NO GROWTH <12 HOURS  Final   Report Status PENDING  Incomplete  Culture, blood (Routine X 2) w Reflex to ID Panel     Status: None (Preliminary result)   Collection Time: 12/18/15  4:37 AM  Result Value Ref Range Status   Specimen Description BLOOD LEFT ARM  Final   Special Requests BOTTLES DRAWN AEROBIC AND ANAEROBIC 10ML  Final   Culture NO GROWTH <12 HOURS  Final   Report Status PENDING  Incomplete    IMAGING: Dg Chest 1 View  12/18/2015  CLINICAL DATA:  Shortness of breath. EXAM: CHEST 1 VIEW COMPARISON:  12/17/2015. FINDINGS: Endotracheal tube, right IJ line, NG tube in stable position. Prior CABG. Persistent but improving bilateral pulmonary infiltrates consistent with improving congestive heart failure. Bilateral pneumonia cannot be excluded. Small left pleural effusion. No pneumothorax. IMPRESSION: 1. Lines and tubes in stable position. 2. Prior CABG. Stable cardiomegaly. Persistent but slightly improving bilateral pulmonary infiltrates and left pleural effusion. Findings consistent with improving congestive heart failure. Electronically Signed   By: Marcello Moores  Register   On: 12/18/2015 07:05   Dg Chest 1 View  12/17/2015  CLINICAL DATA:  Acute onset of dyspnea.  Initial encounter. EXAM: CHEST 1 VIEW COMPARISON:  Chest radiograph performed 12/16/2015 FINDINGS: The patient's endotracheal tube is seen ending 3-4 cm above the carina. A right IJ line is noted ending about the distal SVC. An enteric tube is noted extending below the diaphragm. Mildly worsened patchy bilateral airspace opacities are noted, more prominent on the left, concerning for worsening pneumonia. Given prior appearance, this could also reflect interstitial edema. No definite pleural effusion or pneumothorax is seen. The cardiomediastinal silhouette is mildly enlarged. The patient is status post median sternotomy. No acute osseous abnormalities are seen.  IMPRESSION: 1. Endotracheal tube seen ending 3-4 cm above the carina. 2. Right IJ line noted ending about the distal SVC. 3. Mildly  worsened patchy bilateral airspace opacities, more prominent on the left, concerning for worsening pneumonia. Given prior appearance, this could also reflect interstitial edema. Electronically Signed   By: Garald Balding M.D.   On: 12/17/2015 04:12   Dg Chest 1 View  12/07/2015  CLINICAL DATA:  Hypoxia EXAM: CHEST 1 VIEW COMPARISON:  December 06, 2015 FINDINGS: Endotracheal tube tip is 4.1 cm above the carina. Nasogastric tube tip and side port are in the stomach. Central catheter tip is in the superior vena cava. No pneumothorax. There is postoperative change in the medial aspect of the left upper lobe. There is also postoperative change in the medial right upper lobe. There is mild scarring in the right base. There is no edema or consolidation. The heart size and pulmonary vascularity are within normal limits. No adenopathy. IMPRESSION: Postoperative change in both upper lobes. No edema or consolidation. Tube and catheter positions as described without pneumothorax. Electronically Signed   By: Lowella Grip III M.D.   On: 12/07/2015 08:39   Dg Chest 2 View  12/02/2015  CLINICAL DATA:  Short of breath since yesterday. EXAM: CHEST  2 VIEW COMPARISON:  CT chest 06/23/2015 FINDINGS: The lungs are hyperinflated likely secondary to COPD. Postsurgical changes at the lung apices bilaterally There is no focal parenchymal opacity. There is no pleural effusion or pneumothorax. The heart and mediastinal contours are unremarkable. There is evidence of prior CABG. The osseous structures are unremarkable. IMPRESSION: No active cardiopulmonary disease. Electronically Signed   By: Kathreen Devoid   On: 12/02/2015 11:10   Dg Abd 1 View  12/16/2015  CLINICAL DATA:  Orogastric tube placement EXAM: ABDOMEN - 1 VIEW COMPARISON:  Abdominal radiograph 12/14/2015 and 12/06/2015 FINDINGS: The enteric  tube is positioned within the stomach, along the greater curvature of the stomach, in satisfactory position. The side port of the tube is within the stomach as well. There is some gaseous distension of left and central abdominal bowel loops. The gaseous distention is without significant change from the radiograph of 12/14/2015. No evidence of pneumatosis or free intraperitoneal air. Opacities are noted at both lung bases. IMPRESSION: Satisfactory position of enteric tube (orogastric vs nasogastric) longer greater curvature of the stomach. No significant change in ileus pattern. Electronically Signed   By: Curlene Dolphin M.D.   On: 12/16/2015 13:14   Dg Abd 1 View  12/14/2015  CLINICAL DATA:  Ileus, recent lower extremity amputation. EXAM: ABDOMEN - 1 VIEW COMPARISON:  12/13/2015 abdominal radiograph. FINDINGS: Right common femoral central venous catheter terminates over the IVC at the L3-4 disc level. Partially visualized is a vascular stent overlying the right inguinal region. Surgical clips overlie the lower deep pelvis. There is mild gaseous distention of the small and large bowel, improved in the interval. No evidence of pneumatosis, pneumoperitoneum or pathologic soft tissue calcifications. IMPRESSION: Improving mild adynamic ileus. Electronically Signed   By: Ilona Sorrel M.D.   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 Small volume gas in the rectum. No definite small bowel dilatation. Right femoral approach hemodialysis catheter. The catheter tip overlies the lower IVC. No acute osseous abnormality. IMPRESSION: 1. Diffuse gaseous distension of the colon suggests postoperative ileus. 2. Right femoral approach hemodialysis catheter with the tip overlying the lower SVC. Electronically Signed   By: Myrle Sheng  Laurence Ferrari M.D.   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 EXAM: CT HEAD WITHOUT CONTRAST TECHNIQUE: Contiguous axial images were obtained from the base of the skull through the vertex without intravenous contrast. COMPARISON:  12/07/2015 FINDINGS: Interval evolution of bilateral cerebellar infarcts now shows the right cerebellar infarct to the larger than that on the left. No evidence for interval progression on either side. No evidence for hemorrhagic transformation. The cytotoxic edema is seen in the right PCA territory on the previous study shows continued evolution with more noticeable low-density in this territory today. No evidence for associated hemorrhage. No evidence for new acute infarct. No evidence for hydrocephalus or midline shift. Diffuse loss of parenchymal volume is consistent with atrophy. Patchy low attenuation in the deep hemispheric and periventricular white matter is nonspecific, but likely reflects chronic microvascular ischemic demyelination. The visualized paranasal sinuses and mastoid air cells are clear. IMPRESSION: 1. 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 2. No new or progressive findings. 3. Atrophy with chronic small vessel white matter ischemic disease. Electronically Signed   By: Misty Stanley M.D.   On: 12/17/2015 18:42   Ct Head Wo Contrast  12/07/2015  CLINICAL DATA:  Encephalopathy. Soft tissue swelling in the neck. Occlusion of lower extremity graft. EXAM: CT HEAD WITHOUT CONTRAST CT NECK WITHOUT CONTRAST TECHNIQUE: Contiguous axial images were obtained from the base of the skull through the vertex without contrast. Multidetector CT imaging of the neck was performed using the standard protocol without intravenous contrast. COMPARISON:  CT head without contrast 10/27/2015. FINDINGS: CT HEAD  FINDINGS Extensive periventricular and subcortical white matter disease is again noted. The basal ganglia are intact. Hypoattenuation within the internal capsule is not significantly changed. The insular ribbon is within normal limits bilaterally. There is loss of gray-white differentiation in the right PCA distribution suggesting an acute/subacute infarct. No associated hemorrhage is present. Bilateral cerebellar infarcts are also suggested. The fourth ventricle is intact. There is no hydrocephalus. No significant extra-axial fluid collection is present. The paranasal sinuses and mastoid air cells are clear. The calvarium is intact. CT NECK FINDINGS A right-sided catheter is in place. The catheter appears to be extraluminal in the neck to the level of the thoracic inlet. The distal tip may be in the SVC. Diffuse subcutaneous stranding is more prominent right than left. There is asymmetric enlargement of the right sternocleidomastoid muscle and strap muscles. No discrete hyperdense fluid collection is present. Asymmetric subcutaneous edema extends over the right chest. There is no significant fluid collection in the superior mediastinum. The patient is intubated. An NG tube is in place. Atherosclerotic calcifications are present at the aortic arch and carotid bifurcations. Advanced degenerative changes are present throughout the cervical spine with mild rightward curvature and extensive facet disease. IMPRESSION: 1. 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 2. Otherwise stable extensive white matter disease. 3. The right neck venous catheter appears to be extra luminal at least to the thoracic inlet. 4. Asymmetric subcutaneous and intramuscular edema and possibly hemorrhage within the right neck and chest. The extraluminal position of the catheter may be contributing. 5. No discrete fluid collection or hemorrhage in the neck. 6. Atherosclerosis.  These results were called by telephone at the time of interpretation on 12/07/2015 at 10:02 am to Dr. Mortimer Fries , who verbally acknowledged  these results. Electronically Signed   By: San Morelle M.D.   On: 12/07/2015 10:04   Ct Soft Tissue Neck Wo Contrast  12/07/2015  CLINICAL DATA:  Encephalopathy. Soft tissue swelling in the neck. Occlusion of lower extremity graft. EXAM: CT HEAD WITHOUT CONTRAST CT NECK WITHOUT CONTRAST TECHNIQUE: Contiguous axial images were obtained from the base of the skull through the vertex without contrast. Multidetector CT imaging of the neck was performed using the standard protocol without intravenous contrast. COMPARISON:  CT head without contrast 10/27/2015. FINDINGS: CT HEAD FINDINGS Extensive periventricular and subcortical white matter disease is again noted. The basal ganglia are intact. Hypoattenuation within the internal capsule is not significantly changed. The insular ribbon is within normal limits bilaterally. There is loss of gray-white differentiation in the right PCA distribution suggesting an acute/subacute infarct. No associated hemorrhage is present. Bilateral cerebellar infarcts are also suggested. The fourth ventricle is intact. There is no hydrocephalus. No significant extra-axial fluid collection is present. The paranasal sinuses and mastoid air cells are clear. The calvarium is intact. CT NECK FINDINGS A right-sided catheter is in place. The catheter appears to be extraluminal in the neck to the level of the thoracic inlet. The distal tip may be in the SVC. Diffuse subcutaneous stranding is more prominent right than left. There is asymmetric enlargement of the right sternocleidomastoid muscle and strap muscles. No discrete hyperdense fluid collection is present. Asymmetric subcutaneous edema extends over the right chest. There is no significant fluid collection in the superior mediastinum. The patient is intubated. An NG tube is in place. Atherosclerotic  calcifications are present at the aortic arch and carotid bifurcations. Advanced degenerative changes are present throughout the cervical spine with mild rightward curvature and extensive facet disease. IMPRESSION: 1. 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 2. Otherwise stable extensive white matter disease. 3. The right neck venous catheter appears to be extra luminal at least to the thoracic inlet. 4. Asymmetric subcutaneous and intramuscular edema and possibly hemorrhage within the right neck and chest. The extraluminal position of the catheter may be contributing. 5. No discrete fluid collection or hemorrhage in the neck. 6. Atherosclerosis. These results were called by telephone at the time of interpretation on 12/07/2015 at 10:02 am to Dr. Mortimer Fries , who verbally acknowledged these results. Electronically Signed   By: San Morelle M.D.   On: 12/07/2015 10:04   Ct Angio Low Extrem Left W/cm &/or Wo/cm  12/05/2015  CLINICAL DATA:  Acute onset of left leg pain. Cold left foot. Personal history of left-sided fem-pop bypass graft. Initial encounter. EXAM: CT ANGIOGRAPHY OF THE LEFT LOWER EXTREMITY TECHNIQUE: Multidetector CT imaging of the left lower extremity was performed using the standard protocol during bolus administration of intravenous contrast. Multiplanar CT image reconstructions and MIPs were obtained to evaluate the vascular anatomy. CONTRAST:  163m OMNIPAQUE IOHEXOL 350 MG/ML SOLN COMPARISON:  Left lower extremity venous Doppler ultrasound performed 12/02/2015 FINDINGS: There is complete occlusion of the patient's left-sided fem-pop bypass graft. This corresponds to the patient's acute symptoms. There is vague diffuse apparent aneurysmal dilatation along the course of the fem-pop bypass graft, which also appears completely occluded. 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 A small amount of blood flow is noted tracking into the profunda femoris artery and its branches, which tracks along small branch vessels distally to the level of the  knee. Diffuse calcification is noted along the abdominal aorta and its branches. 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 Visualized small and large bowel loops are grossly unremarkable. The visualized musculature is grossly unremarkable in appearance. No acute osseous abnormalities are seen. No knee joint effusion is identified. Postoperative change is noted about the prostate bed. The bladder is mildly distended and grossly unremarkable. Review of the MIP images confirms the above findings. IMPRESSION: 1. Complete occlusion of the patient's left-sided fem-pop bypass graft, corresponding to the patient's acute symptoms. Underlying vague diffuse apparent aneurysmal dilatation along the course of the fem-pop bypass graft, which also appears completely occluded. 2. 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 Small amount of blood flow tracks into the profunda femoris artery and its branches, which extends along a small branch vessels distally to the level of the knee and likely explains residual left-sided pedal pulses. 3. Diffuse calcification along the abdominal aorta and its branches. 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 Critical Value/emergent results were called by telephone at the time of interpretation on 12/05/2015 at 4:43 am to Dr. Marjean Donna, who verbally acknowledged these results. Electronically Signed   By: Garald Balding M.D.   On: 12/05/2015 04:49   US Venous Img Lower Unilateral Left  12/02/2015  CLINICAL DATA:  70 year old presenting with a 2 day history of left calf pain. Personal history of right lower  extremity above knee amputation. EXAM: LEFT LOWER EXTREMITY VENOUS DOPPLER ULTRASOUND TECHNIQUE: Gray-scale sonography with graded compression, as well as color Doppler and duplex ultrasound were performed to evaluate the lower extremity deep venous systems from the level of the common femoral vein and including the common femoral, femoral, profunda femoral, popliteal and calf veins including the posterior tibial, peroneal and gastrocnemius veins when visible. The superficial great saphenous vein was also interrogated. Spectral Doppler was utilized to evaluate flow at rest and with distal augmentation maneuvers in the common femoral, femoral and popliteal veins. COMPARISON:  None. FINDINGS: Contralateral Common Femoral Vein: Respiratory phasicity is normal and symmetric with the symptomatic side. No evidence of thrombus. Normal compressibility. Common Femoral Vein: No evidence of thrombus. Normal compressibility, respiratory phasicity and response to augmentation. Saphenofemoral Junction: No evidence of thrombus. Normal compressibility and flow on color Doppler imaging. Profunda Femoral Vein: No evidence of thrombus. Normal compressibility and flow on color Doppler imaging. Femoral Vein: No evidence of thrombus. Normal compressibility, respiratory phasicity and response to augmentation. Popliteal Vein: No evidence of thrombus. Normal compressibility, respiratory phasicity and response to augmentation. Calf Veins: No evidence of thrombus. Normal compressibility and flow on color Doppler imaging. Superficial Great Saphenous Vein: Not evaluated. Venous Reflux:  Not evaluated. Other Findings: Arterial bypass graft in the left lower extremity which was not evaluated on this lower extremity venous examination. IMPRESSION: No evidence of left lower extremity DVT. Electronically Signed   By: Evangeline Dakin M.D.   On: 12/02/2015 12:02   US Venous Img Upper Uni Right  12/16/2015  CLINICAL DATA:  Right upper extremity  edema. History of lung cancer. History of pulmonary embolism and right internal jugular approach central venous catheter placement. Evaluate for DVT. EXAM: RIGHT UPPER EXTREMITY VENOUS DOPPLER ULTRASOUND TECHNIQUE: Gray-scale sonography with graded compression, as well as color Doppler and duplex ultrasound were performed to evaluate the upper extremity deep venous system from the level of the subclavian vein and including the jugular, axillary, basilic,  radial, ulnar and upper cephalic vein. Spectral Doppler was utilized to evaluate flow at rest and with distal augmentation maneuvers. COMPARISON:  Chest radiograph - earlier same day FINDINGS: Contralateral Subclavian Vein: Respiratory phasicity is normal and symmetric with the symptomatic side. No evidence of thrombus. Normal compressibility. Internal Jugular Vein: Not evaluated secondary to bandages associated with jugular approach central venous catheter. Subclavian Vein: No evidence of thrombus. Normal compressibility, respiratory phasicity and response to augmentation. Axillary Vein: No evidence of thrombus. Normal compressibility, respiratory phasicity and response to augmentation. Cephalic Vein: No evidence of thrombus. Normal compressibility, respiratory phasicity and response to augmentation. Basilic Vein: No evidence of thrombus. Normal compressibility, respiratory phasicity and response to augmentation. Brachial Veins: No evidence of thrombus. Normal compressibility, respiratory phasicity and response to augmentation. Radial Veins: No evidence of thrombus. Normal compressibility, respiratory phasicity and response to augmentation. Ulnar Veins: No evidence of thrombus. Normal compressibility, respiratory phasicity and response to augmentation. Venous Reflux:  None visualized. Other Findings:  None visualized. IMPRESSION: No evidence of DVT within right upper extremity. Electronically Signed   By: Sandi Mariscal M.D.   On: 12/16/2015 14:15   Dg Chest Port 1  View  12/16/2015  CLINICAL DATA:  Endotracheal tube placement EXAM: PORTABLE CHEST 1 VIEW COMPARISON:  Yesterday FINDINGS: New endotracheal tube with tip just below the clavicular heads. An orogastric tube reaches stomach at least. Right IJ central line in stable position with tip at the upper right atrium. EKG leads over the chest create artifact. Essentially stable cardiopericardial enlargement. The patient is status post CABG. Improved diffuse airspace disease. Superimposed layering pleural effusions. No evidence of pneumothorax (pleural reflection or skin fold noted on the right with lung sutures at both apices). IMPRESSION: 1. New endotracheal and orogastric tubes are in good position. 2. Improved pulmonary edema. Electronically Signed   By: Monte Fantasia M.D.   On: 12/16/2015 13:16   Dg Chest Port 1 View  12/15/2015  CLINICAL DATA:  Aspiration into respiratory tract. EXAM: PORTABLE CHEST 1 VIEW COMPARISON:  Radiograph yesterday at 0542 hour FINDINGS: Tip of the right central line in the distal SVC. Patient is post median sternotomy. Cardiomegaly is unchanged. Worsening perihilar pulmonary edema from prior exam. Obscuration of left hemidiaphragm, with new right basilar opacity. Chain sutures in the right lung apex. Skin fold projects over the right hemithorax. IMPRESSION: 1. Increasing pulmonary edema.  Unchanged cardiomegaly. 2. Increasing bibasilar opacities, can be seen in the setting of aspiration. Alternatively this could be vascular. Electronically Signed   By: Jeb Levering M.D.   On: 12/15/2015 23:03   Dg Chest Port 1 View  12/14/2015  CLINICAL DATA:  Acute respiratory failure.  Subsequent encounter. EXAM: PORTABLE CHEST 1 VIEW COMPARISON:  12/13/2015 FINDINGS: Changes from the previous median sternotomy are stable. There are pulmonary anastomosis staples in both apices, also stable. Cardiopericardial silhouette is mildly enlarged, also stable. Mild interstitial thickening is subtly  improved. Left lung base opacity is stable most likely due to pleural fluid. No new lung abnormalities. No pneumothorax. Right internal jugular central venous line is stable and well positioned. IMPRESSION: 1. Slight improvement in presumed pulmonary edema. 2. No other change. Persistent left lung base opacity most likely a combination of pleural fluid and atelectasis. Electronically Signed   By: Lajean Manes M.D.   On: 12/14/2015 09:13   Dg Chest Port 1 View  12/13/2015  CLINICAL DATA:  Respiratory distress. History of right lung lobectomy for lung cancer. EXAM: PORTABLE CHEST 1 VIEW COMPARISON:  Chest radiograph from earlier today. FINDINGS: Right internal jugular central venous catheter terminates at the cavoatrial junction. Sternotomy wires appear aligned and intact. Stable cardiomediastinal silhouette with mild cardiomegaly. No pneumothorax. Probable stable small left pleural effusion. Mild pulmonary edema, increased. Left lower lobe opacity, stable, probably atelectasis. IMPRESSION: 1. Mild cardiomegaly with increased mild pulmonary edema, suggesting congestive heart failure. 2. Probable stable small left pleural effusion. 3. Left lower lobe opacity, stable, probably atelectasis. Electronically Signed   By: Ilona Sorrel M.D.   On: 12/13/2015 19:22   Dg Chest Port 1 View  12/13/2015  CLINICAL DATA:  70 year old male with acute respiratory failure EXAM: PORTABLE CHEST 1 VIEW COMPARISON:  Prior chest x-ray 12/07/2015 FINDINGS: The tip of the endotracheal tube is 2.5 cm above the carina. A right IJ central venous catheter projects over the superior cavoatrial junction. A gastric tube is present. The tip lies below the diaphragm, presumably within the stomach. External defibrillator pads project over the left chest. Stable cardiomegaly. Patient is status post median sternotomy with evidence of prior CABG including LIMA bypass. Surgical changes suggest prior surgery in both upper lungs as well. Low  inspiratory volumes with bibasilar atelectasis. No overt pulmonary edema, pneumothorax or focal airspace consolidation. IMPRESSION: 1. Low inspiratory volumes with bibasilar atelectasis. 2. Stable cardiomegaly. 3. Stable and satisfactory support apparatus. Electronically Signed   By: Jacqulynn Cadet M.D.   On: 12/13/2015 08:01   Dg Chest Port 1 View  12/06/2015  CLINICAL DATA:  Hypoxia EXAM: PORTABLE CHEST 1 VIEW COMPARISON:  December 02, 2015 FINDINGS: Endotracheal tube tip is 4.8 cm above the carina. Nasogastric tube tip is in the stomach with the side port at the gastroesophageal junction. Central catheter tip is in the superior vena cava. No pneumothorax. There is scarring in the right and left lung bases. There is postoperative change in the medial right upper lobe region. Lungs elsewhere clear. Heart size and pulmonary vascularity are normal. No adenopathy. No bone lesions. Patient is status post coronary artery bypass grafting. IMPRESSION: Tube and catheter positions as described without pneumothorax. Note that the nasogastric tube side port is at the gastroesophageal junction. Advise advancing nasogastric tube 4-5 cm. There is bibasilar lung scarring with postoperative change in the right upper lobe. No edema or consolidation. No change in cardiac silhouette. These results will be called to the ordering clinician or representative by the Radiologist Assistant, and communication documented in the PACS or zVision Dashboard. Electronically Signed   By: Lowella Grip III M.D.   On: 12/06/2015 13:51   Dg Abd Portable 1v  12/06/2015  CLINICAL DATA:  Intubation, OG tube placement. History of coronary artery disease, lung cancer, prostate cancer, stroke and diabetes EXAM: PORTABLE ABDOMEN - 1 VIEW COMPARISON:  None. FINDINGS: The OG tube is only partially seen at the upper aspects of this exam, of uncertain position, presumably in the stomach. Bowel gas pattern is nonobstructive. Moderate amount of stool  throughout the grossly nondistended colon. Hyperdense material within the right upper quadrant is of uncertain etiology or significance, possibly contrast within the gallbladder. No evidence of free intraperitoneal air seen. Osseous structures are unremarkable. IMPRESSION: 1. OG tube incompletely visualized at the upper aspects of this study. On a chest x-ray performed earlier same day, tip is seen in the stomach but with proximal side holes at the level of the gastroesophageal junction. Recommend advancing for more optimal radiographic positioning. 2. Nonobstructive bowel gas pattern. Fairly large amount of stool within the colon (constipation? ). 3. Hyperdense  material within the right upper quadrant, of uncertain etiology or significance, most likely vicarious excretion of contrast within the gallbladder related to patient's recent CT angiogram runoff. Electronically Signed   By: Franki Cabot M.D.   On: 12/06/2015 14:00    Assessment:   Stephen Camacho is a 70 y.o. male ith hx PAD, CAD, DM, admitted with L foot pain and found to have ischemia and occlusion of Fem Pop bypass. Has been admitted for 13 days. Underwent angiogram 2/10 but needed  BKA 2/17. Has had persistent fevers and leukocytosis Course complicated by CVA, ARF, MI. Has been on abx since admit with zosyn and vanco from 2/11- 2/19.  Was off abx for one day 2/20 there restarted ceftazidime and vancol.  Cultures with sputum growing MSSA from 2/11 otherwise negative  Recommendations He has sputum cx being held for possible pathogen Cotinue ceftazidime and vanco Add flagyl for anaerobes  Thank you very much for allowing me to participate in the care of this patient. Please call with questions.   Cheral Marker. Ola Spurr, MD

## 2015-12-18 NOTE — Progress Notes (Signed)
[facility]  ROUNDING NOTE   Subjective:  Intubated 2/21, now on ventilator Na level is critically high, potassium is still low UOP is good 3525 cc S Cr improved further    Objective:  Vital signs in last 24 hours:  Temp:  [99.1 F (37.3 C)-102 F (38.9 C)] 99.1 F (37.3 C) (02/23 0700) Pulse Rate:  [79-100] 79 (02/23 0700) Resp:  [19-31] 21 (02/23 0700) BP: (94-132)/(53-69) 101/55 mmHg (02/23 0700) SpO2:  [97 %-100 %] 100 % (02/23 0700) FiO2 (%):  [35 %] 35 % (02/23 0748)  Weight change:  Filed Weights   12/16/15 0359  Weight: 72 kg (158 lb 11.7 oz)    Intake/Output: I/O last 3 completed shifts: In: 5361.9 [I.V.:2154.4; NG/GT:2957.5; IV Piggyback:250] Out: 7628 [Urine:4675]   Intake/Output this shift:     Physical Exam: General: Critically ill appearing  Head: moist oral mucosal membranes  E/ENT: ETT,  OGT  Neck: Right central line IJ  Lungs:  Vent assisted  Heart: Tachycardic, regular  Abdomen:  Soft, nontender  Extremities: Right AKA, left AKA (new), ++ dependent edema over extremities  Neurologic: sedated  Skin: No lesions     Foley, rectal tube    Basic Metabolic Panel:  Recent Labs Lab 12/13/15 0510 12/14/15 0213 12/15/15 0500 12/16/15 0440 12/17/15 0421 12/17/15 1002 12/17/15 2222 12/18/15 0244 12/18/15 0526 12/18/15 0904  NA 145 148* 147* 151* 154* 155* 154* 152* 153*  153* 151*  K 3.8 3.3* 3.6 3.5 2.6*  --   --   --  3.4*  --   CL 111 112* 115* 118* 116*  --   --   --  118*  --   CO2 '28 26 22 '$ 21* 27  --   --   --  28  --   GLUCOSE 298* 248* 172* 229* 232*  --   --   --  267*  --   BUN 34* 36* 50* 60* 52*  --   --   --  42*  --   CREATININE 1.74* 2.02* 2.37* 2.42* 2.40*  --   --   --  1.91*  --   CALCIUM 7.3* 7.4* 7.3* 7.4* 7.3*  --   --   --  7.0*  --   MG  --  1.9 2.0 2.2 2.0  --   --   --  2.0  --   PHOS 1.7* 2.5 3.7  --  3.3  --   --   --  2.8  --     Liver Function Tests: No results for input(s): AST, ALT,  ALKPHOS, BILITOT, PROT, ALBUMIN in the last 168 hours. No results for input(s): LIPASE, AMYLASE in the last 168 hours. No results for input(s): AMMONIA in the last 168 hours.  CBC:  Recent Labs Lab 12/13/15 1701 12/14/15 0213 12/15/15 0500 12/16/15 0440 12/17/15 0421  WBC 19.5* 17.8* 19.1* 28.5* 36.9*  HGB 6.8* 8.2* 8.1* 8.4* 8.4*  HCT 21.2* 24.8* 24.4* 25.8* 26.7*  MCV 85.0 86.6 84.4 86.7 86.9  PLT 187 188 213 204 190    Cardiac Enzymes:  Recent Labs Lab 12/13/15 2020 12/14/15 0213 12/14/15 0809 12/16/15 0440  TROPONINI 2.03* 2.67* 3.00* 2.70*    BNP: Invalid input(s): POCBNP  CBG:  Recent Labs Lab 12/17/15 1616 12/17/15 2002 12/17/15 2348 12/18/15 0408 12/18/15 0748  GLUCAP 205* 169* 225* 247* 230*    Microbiology: Results for orders placed or performed during the hospital encounter of 12/05/15  Culture, respiratory (NON-Expectorated)  Status: None   Collection Time: 12/06/15  4:05 PM  Result Value Ref Range Status   Specimen Description NASAL SWAB  Final   Special Requests NONE  Final   Gram Stain RARE WBC SEEN FEW GRAM POSITIVE COCCI   Final   Culture HEAVY GROWTH STAPHYLOCOCCUS AUREUS  Final   Report Status 12/09/2015 FINAL  Final   Organism ID, Bacteria STAPHYLOCOCCUS AUREUS  Final      Susceptibility   Staphylococcus aureus - MIC*    CIPROFLOXACIN 2 INTERMEDIATE Intermediate     GENTAMICIN <=0.5 SENSITIVE Sensitive     OXACILLIN <=0.25 SENSITIVE Sensitive     TETRACYCLINE <=1 SENSITIVE Sensitive     VANCOMYCIN 1 SENSITIVE Sensitive     TRIMETH/SULFA <=10 SENSITIVE Sensitive     RIFAMPIN <=0.5 SENSITIVE Sensitive     Inducible Clindamycin NEGATIVE Sensitive     * HEAVY GROWTH STAPHYLOCOCCUS AUREUS  Culture, blood (Routine X 2) w Reflex to ID Panel     Status: None   Collection Time: 12/07/15 12:09 AM  Result Value Ref Range Status   Specimen Description BLOOD BLOOD RIGHT HAND  Final   Special Requests BOTTLES DRAWN AEROBIC AND  ANAEROBIC 10CC  Final   Culture NO GROWTH 6 DAYS  Final   Report Status 12/13/2015 FINAL  Final  Culture, blood (Routine X 2) w Reflex to ID Panel     Status: None   Collection Time: 12/07/15 12:09 AM  Result Value Ref Range Status   Specimen Description BLOOD BLOOD LEFT HAND  Final   Special Requests BOTTLES DRAWN AEROBIC AND ANAEROBIC 10CC  Final   Culture NO GROWTH 6 DAYS  Final   Report Status 12/13/2015 FINAL  Final  Urine culture     Status: None   Collection Time: 12/07/15 12:11 AM  Result Value Ref Range Status   Specimen Description URINE, CLEAN CATCH  Final   Special Requests NONE  Final   Culture NO GROWTH 1 DAY  Final   Report Status 12/08/2015 FINAL  Final  MRSA PCR Screening     Status: None   Collection Time: 12/08/15  9:12 AM  Result Value Ref Range Status   MRSA by PCR NEGATIVE NEGATIVE Final    Comment:        The GeneXpert MRSA Assay (FDA approved for NASAL specimens only), is one component of a comprehensive MRSA colonization surveillance program. It is not intended to diagnose MRSA infection nor to guide or monitor treatment for MRSA infections.   C difficile quick scan w PCR reflex     Status: None   Collection Time: 12/11/15  1:54 PM  Result Value Ref Range Status   C Diff antigen NEGATIVE NEGATIVE Final   C Diff toxin NEGATIVE NEGATIVE Final   C Diff interpretation Negative for C. difficile  Final  Culture, blood (Routine X 2) w Reflex to ID Panel     Status: None   Collection Time: 12/13/15  9:33 AM  Result Value Ref Range Status   Specimen Description BLOOD LEFT ANTECUBITAL  Final   Special Requests BOTTLES DRAWN AEROBIC AND ANAEROBIC  4CC  Final   Culture NO GROWTH 5 DAYS  Final   Report Status 12/18/2015 FINAL  Final  Culture, blood (Routine X 2) w Reflex to ID Panel     Status: None   Collection Time: 12/13/15  9:41 AM  Result Value Ref Range Status   Specimen Description BLOOD RIGHT ARM  Final   Special Requests BOTTLES  DRAWN AEROBIC AND  ANAEROBIC  2CC  Final   Culture NO GROWTH 5 DAYS  Final   Report Status 12/18/2015 FINAL  Final  Urine culture     Status: None   Collection Time: 12/13/15 11:01 AM  Result Value Ref Range Status   Specimen Description URINE, CATHETERIZED  Final   Special Requests NONE  Final   Culture NO GROWTH 1 DAY  Final   Report Status 12/14/2015 FINAL  Final  Culture, expectorated sputum-assessment     Status: None   Collection Time: 12/13/15 11:30 AM  Result Value Ref Range Status   Specimen Description TRACHEAL ASPIRATE  Final   Special Requests NONE  Final   Sputum evaluation THIS SPECIMEN IS ACCEPTABLE FOR SPUTUM CULTURE  Final   Report Status 12/13/2015 FINAL  Final  Culture, respiratory (NON-Expectorated)     Status: None   Collection Time: 12/13/15 11:30 AM  Result Value Ref Range Status   Specimen Description TRACHEAL ASPIRATE  Final   Special Requests NONE Reflexed from [id number]  Final   Gram Stain   Final    FEW WBC SEEN FEW GRAM POSITIVE COCCI FEW YEAST FAIR SPECIMEN - 70-80% WBCS    Culture LIGHT GROWTH CANDIDA ALBICANS  Final   Report Status 12/17/2015 FINAL  Final  Cath Tip Culture     Status: None   Collection Time: 12/14/15 12:47 PM  Result Value Ref Range Status   Specimen Description CATH TIP  Final   Special Requests NONE  Final   Culture NO GROWTH 3 DAYS  Final   Report Status 12/17/2015 FINAL  Final  Urine culture     Status: None   Collection Time: 12/16/15  8:19 AM  Result Value Ref Range Status   Specimen Description URINE, RANDOM  Final   Special Requests Normal  Final   Culture NO GROWTH 1 DAY  Final   Report Status 12/17/2015 FINAL  Final  CULTURE, BLOOD (ROUTINE X 2) w Reflex to PCR ID Panel     Status: None (Preliminary result)   Collection Time: 12/16/15  8:28 AM  Result Value Ref Range Status   Specimen Description BLOOD LEFT ARM  Final   Special Requests BOTTLES DRAWN AEROBIC AND ANAEROBIC 10ML  Final   Culture NO GROWTH 2 DAYS  Final   Report  Status PENDING  Incomplete  CULTURE, BLOOD (ROUTINE X 2) w Reflex to PCR ID Panel     Status: None (Preliminary result)   Collection Time: 12/16/15  8:45 AM  Result Value Ref Range Status   Specimen Description BLOOD RIGHT ARM  Final   Special Requests BOTTLES DRAWN AEROBIC AND ANAEROBIC 10ML  Final   Culture NO GROWTH 2 DAYS  Final   Report Status PENDING  Incomplete  C difficile quick scan w PCR reflex     Status: None   Collection Time: 12/16/15  4:26 PM  Result Value Ref Range Status   C Diff antigen NEGATIVE NEGATIVE Final   C Diff toxin NEGATIVE NEGATIVE Final   C Diff interpretation Negative for C. difficile  Final  Culture, expectorated sputum-assessment     Status: None   Collection Time: 12/16/15  5:32 PM  Result Value Ref Range Status   Specimen Description SPUTUM  Final   Special Requests NONE  Final   Sputum evaluation THIS SPECIMEN IS ACCEPTABLE FOR SPUTUM CULTURE  Final   Report Status 12/16/2015 FINAL  Final  Culture, respiratory (NON-Expectorated)     Status: None (Preliminary  result)   Collection Time: 12/16/15  5:32 PM  Result Value Ref Range Status   Specimen Description SPUTUM  Final   Special Requests NONE Reflexed from [id number]  Final   Gram Stain PENDING  Incomplete   Culture Consistent with normal respiratory flora.  Final   Report Status PENDING  Incomplete  MRSA PCR Screening     Status: None   Collection Time: 12/17/15 11:50 AM  Result Value Ref Range Status   MRSA by PCR NEGATIVE NEGATIVE Final    Comment:        The GeneXpert MRSA Assay (FDA approved for NASAL specimens only), is one component of a comprehensive MRSA colonization surveillance program. It is not intended to diagnose MRSA infection nor to guide or monitor treatment for MRSA infections.   Culture, blood (Routine X 2) w Reflex to ID Panel     Status: None (Preliminary result)   Collection Time: 12/18/15  4:29 AM  Result Value Ref Range Status   Specimen Description BLOOD RIGHT  HAND  Final   Special Requests BOTTLES DRAWN AEROBIC AND ANAEROBIC 5ML  Final   Culture NO GROWTH <12 HOURS  Final   Report Status PENDING  Incomplete  Culture, blood (Routine X 2) w Reflex to ID Panel     Status: None (Preliminary result)   Collection Time: 12/18/15  4:37 AM  Result Value Ref Range Status   Specimen Description BLOOD LEFT ARM  Final   Special Requests BOTTLES DRAWN AEROBIC AND ANAEROBIC 10ML  Final   Culture NO GROWTH <12 HOURS  Final   Report Status PENDING  Incomplete    Coagulation Studies: No results for input(s): LABPROT, INR in the last 72 hours.  Urinalysis:  Recent Labs  12/18/15 0024  COLORURINE YELLOW*  LABSPEC 1.014  PHURINE 8.0  GLUCOSEU 150*  HGBUR 2+*  BILIRUBINUR NEGATIVE  KETONESUR NEGATIVE  PROTEINUR 100*  NITRITE NEGATIVE  LEUKOCYTESUR NEGATIVE      Imaging: Dg Chest 1 View  12/18/2015  CLINICAL DATA:  Shortness of breath. EXAM: CHEST 1 VIEW COMPARISON:  12/17/2015. FINDINGS: Endotracheal tube, right IJ line, NG tube in stable position. Prior CABG. Persistent but improving bilateral pulmonary infiltrates consistent with improving congestive heart failure. Bilateral pneumonia cannot be excluded. Small left pleural effusion. No pneumothorax. IMPRESSION: 1. Lines and tubes in stable position. 2. Prior CABG. Stable cardiomegaly. Persistent but slightly improving bilateral pulmonary infiltrates and left pleural effusion. Findings consistent with improving congestive heart failure. Electronically Signed   By: Marcello Moores  Register   On: 12/18/2015 07:05   Dg Chest 1 View  12/17/2015  CLINICAL DATA:  Acute onset of dyspnea.  Initial encounter. EXAM: CHEST 1 VIEW COMPARISON:  Chest radiograph performed 12/16/2015 FINDINGS: The patient's endotracheal tube is seen ending 3-4 cm above the carina. A right IJ line is noted ending about the distal SVC. An enteric tube is noted extending below the diaphragm. Mildly worsened patchy bilateral airspace opacities  are noted, more prominent on the left, concerning for worsening pneumonia. Given prior appearance, this could also reflect interstitial edema. No definite pleural effusion or pneumothorax is seen. The cardiomediastinal silhouette is mildly enlarged. The patient is status post median sternotomy. No acute osseous abnormalities are seen. IMPRESSION: 1. Endotracheal tube seen ending 3-4 cm above the carina. 2. Right IJ line noted ending about the distal SVC. 3. Mildly worsened patchy bilateral airspace opacities, more prominent on the left, concerning for worsening pneumonia. Given prior appearance, this could also reflect interstitial edema. Electronically  Signed   By: Garald Balding M.D.   On: 12/17/2015 04:12   Dg Abd 1 View  12/16/2015  CLINICAL DATA:  Orogastric tube placement EXAM: ABDOMEN - 1 VIEW COMPARISON:  Abdominal radiograph 12/14/2015 and 12/06/2015 FINDINGS: The enteric tube is positioned within the stomach, along the greater curvature of the stomach, in satisfactory position. The side port of the tube is within the stomach as well. There is some gaseous distension of left and central abdominal bowel loops. The gaseous distention is without significant change from the radiograph of 12/14/2015. No evidence of pneumatosis or free intraperitoneal air. Opacities are noted at both lung bases. IMPRESSION: Satisfactory position of enteric tube (orogastric vs nasogastric) longer greater curvature of the stomach. No significant change in ileus pattern. Electronically Signed   By: Curlene Dolphin M.D.   On: 12/16/2015 13:14   Ct Head Wo Contrast  12/17/2015  CLINICAL DATA:  Recent nonhemorrhagic infarct involving the right PCA territory and bilateral cerebellum, left greater than right. EXAM: CT HEAD WITHOUT CONTRAST TECHNIQUE: Contiguous axial images were obtained from the base of the skull through the vertex without intravenous contrast. COMPARISON:  12/07/2015 FINDINGS: Interval evolution of bilateral  cerebellar infarcts now shows the right cerebellar infarct to the larger than that on the left. No evidence for interval progression on either side. No evidence for hemorrhagic transformation. The cytotoxic edema is seen in the right PCA territory on the previous study shows continued evolution with more noticeable low-density in this territory today. No evidence for associated hemorrhage. No evidence for new acute infarct. No evidence for hydrocephalus or midline shift. Diffuse loss of parenchymal volume is consistent with atrophy. Patchy low attenuation in the deep hemispheric and periventricular white matter is nonspecific, but likely reflects chronic microvascular ischemic demyelination. The visualized paranasal sinuses and mastoid air cells are clear. IMPRESSION: 1. 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 2. No new or progressive findings. 3. Atrophy with chronic small vessel white matter ischemic disease. Electronically Signed   By: Misty Stanley M.D.   On: 12/17/2015 18:42   US Venous Img Upper Uni Right  12/16/2015  CLINICAL DATA:  Right upper extremity edema. History of lung cancer. History of pulmonary embolism and right internal jugular approach central venous catheter placement. Evaluate for DVT. EXAM: RIGHT UPPER EXTREMITY VENOUS DOPPLER ULTRASOUND TECHNIQUE: Gray-scale sonography with graded compression, as well as color Doppler and duplex ultrasound were performed to evaluate the upper extremity deep venous system from the level of the subclavian vein and including the jugular, axillary, basilic, radial, ulnar and upper cephalic vein. Spectral Doppler was utilized to evaluate flow at rest and with distal augmentation maneuvers. COMPARISON:  Chest radiograph - earlier same day FINDINGS: Contralateral Subclavian Vein: Respiratory phasicity is normal and symmetric with the symptomatic side. No evidence of thrombus. Normal compressibility. Internal Jugular Vein: Not  evaluated secondary to bandages associated with jugular approach central venous catheter. Subclavian Vein: No evidence of thrombus. Normal compressibility, respiratory phasicity and response to augmentation. Axillary Vein: No evidence of thrombus. Normal compressibility, respiratory phasicity and response to augmentation. Cephalic Vein: No evidence of thrombus. Normal compressibility, respiratory phasicity and response to augmentation. Basilic Vein: No evidence of thrombus. Normal compressibility, respiratory phasicity and response to augmentation. Brachial Veins: No evidence of thrombus. Normal compressibility, respiratory phasicity and response to augmentation. Radial Veins: No evidence of thrombus. Normal compressibility, respiratory phasicity and response to augmentation. Ulnar Veins: No evidence of thrombus. Normal compressibility, respiratory phasicity and response to  augmentation. Venous Reflux:  None visualized. Other Findings:  None visualized. IMPRESSION: No evidence of DVT within right upper extremity. Electronically Signed   By: Sandi Mariscal M.D.   On: 12/16/2015 14:15   Dg Chest Port 1 View  12/16/2015  CLINICAL DATA:  Endotracheal tube placement EXAM: PORTABLE CHEST 1 VIEW COMPARISON:  Yesterday FINDINGS: New endotracheal tube with tip just below the clavicular heads. An orogastric tube reaches stomach at least. Right IJ central line in stable position with tip at the upper right atrium. EKG leads over the chest create artifact. Essentially stable cardiopericardial enlargement. The patient is status post CABG. Improved diffuse airspace disease. Superimposed layering pleural effusions. No evidence of pneumothorax (pleural reflection or skin fold noted on the right with lung sutures at both apices). IMPRESSION: 1. New endotracheal and orogastric tubes are in good position. 2. Improved pulmonary edema. Electronically Signed   By: Monte Fantasia M.D.   On: 12/16/2015 13:16     Medications:   .  dextrose 5 % with KCl 20 mEq / L 20 mEq (12/17/15 2358)  . feeding supplement (VITAL HIGH PROTEIN) 1,000 mL (12/17/15 2200)  . nitroGLYCERIN    . propofol (DIPRIVAN) infusion 50 mcg/kg/min (12/18/15 0544)   . amiodarone  150 mg Intravenous Once  . amiodarone  200 mg Oral BID  . antiseptic oral rinse  7 mL Mouth Rinse QID  . aspirin  162 mg Oral Daily  . cefTAZidime (FORTAZ)  IV  1 g Intravenous Once  . cefTAZidime (FORTAZ)  IV  1 g Intravenous Q24H  . chlorhexidine gluconate  15 mL Mouth Rinse BID  . feeding supplement (PRO-STAT SUGAR FREE 64)  30 mL Per Tube TID  . free water  200 mL Per Tube Q4H  . heparin subcutaneous  5,000 Units Subcutaneous Q12H  . insulin aspart  0-20 Units Subcutaneous 6 times per day  . insulin glargine  18 Units Subcutaneous Daily  . metoprolol  2.5 mg Intravenous 4 times per day  . pantoprazole sodium  40 mg Per Tube Daily  . senna-docusate  2 tablet Oral BID  . sodium chloride  500 mL Intravenous Once  . vancomycin  1,250 mg Intravenous Once  . vancomycin  1,250 mg Intravenous Q36H   acetaminophen, acetaminophen **OR** [DISCONTINUED] acetaminophen, albuterol, bisacodyl, fentaNYL (SUBLIMAZE) injection, hydrALAZINE, ibuprofen, ipratropium-albuterol, labetalol, morphine injection, naLOXone (NARCAN)  injection, ondansetron **OR** ondansetron (ZOFRAN) IV, ondansetron (ZOFRAN) IV, polyethylene glycol  Assessment/ Plan:  Mr. Stephen Camacho is a 70 y.o. black  male with hypertension, hyperlipidemia, diabetes mellitus type II, coronary artery disease status post 2 vessel CABG, peripheral arterial disease status post right above-the-knee amputation, seizure disorder, lung cancer, prostate cancer, history of CVA, who was admitted to [facility] on 12/05/2015 for ischemic left lower extremity. Hospital course complicated by acute renal failure, acute respiratory failure, new CVAs, myocardial infarction.  1. Acute renal failure:  ATN from contrast exposure. nonoliguric urine  output.  Baseline creatinine of 0.83.  - Cr had stabilized at 1.74, but started to worsen again since 2/18  - ? Due to infection (WBC count worse, fever), likely aspiration - vascath removed - currently no acute indication of HD - S Cr further improved    2. Hypernatremia, Na high at 151 - iv D5W + Kcl - increased rate to 100 /hr  3. Ischemic left lower extremity: peripheral vascular disease: status post Left AKA on 2/17 Dr. Delana Meyer   4. Acute respiratory failure: Status post intubation and on mechanical ventilation.  With acute myocardial infarction and acute CVA.  - probably combination of acute pulm edema and aspiration pneumonia - re-intubated 2/21  5. Anemia with renal failure: PRBC transfusion 2/14 and 2/17.    6. Hypokalemia - replace iv    LOS: 13 Slater Mcmanaman 2/23/20179:51 AM

## 2015-12-18 NOTE — Progress Notes (Signed)
Inpatient Diabetes Program Recommendations  AACE/ADA: New Consensus Statement on Inpatient Glycemic Control (2015)  Target Ranges:  Prepandial:   less than 140 mg/dL      Peak postprandial:   less than 180 mg/dL (1-2 hours)      Critically ill patients:  140 - 180 mg/dL   Review of Glycemic Control  Results for Stephen Camacho, Stephen Camacho (MRN [id number]) as of 12/18/2015 11:27  Ref. Range 12/17/2015 16:16 12/17/2015 20:02 12/17/2015 23:48 12/18/2015 04:08 12/18/2015 07:48  Glucose-Capillary Latest Ref Range: 65-99 mg/dL 205 (H) 169 (H) 225 (H) 247 (H) 230 (H)    Diabetes history: DM2 Outpatient Diabetes medications: Lantus 10 units QHS, Metformin 500 mg BID Current orders for Inpatient glycemic control: Novolog 0-120 units Q4H, Lantus 18 units qday (started today)  Inpatient Diabetes Program Recommendations: elevated cbg with feeds, fasting blood sugar '230mg'$ /dl - consider adding Novolog tube feed coverage 6 units q4h (hold if feeds are held), continue Novolog correction insulin as ordered.  Gentry Fitz, RN, BA, MHA, CDE Diabetes Coordinator Inpatient Diabetes Program  [phone] (Team Pager) [phone] ([place]) 12/18/2015 11:29 AM

## 2015-12-18 NOTE — Progress Notes (Signed)
[facility] ADULT ICU REPLACEMENT PROTOCOL FOR AM LAB REPLACEMENT ONLY  The patient does not apply for the [facility] Adult ICU Electrolyte Replacment Protocol based on the criteria listed below:   Is GFR >/= 40 ml/min? No.  Patient's GFR today is 34   Abnormal electrolyte(s): K3.3   If a panic level lab has been reported, has the CCM MD in charge been notified? Yes.    Physician:  Dr.  Armando Gang, Gust Rung 12/18/2015 6:44 AM

## 2015-12-18 NOTE — Progress Notes (Signed)
[place] NOTE  Pharmacy Consult for electrolyte replacement     No Known Allergies  Patient Measurements: Height: '6\' 4"'$  (193 cm) Weight:  (bed scale does not work. plan ops notified. ) IBW/kg (Calculated) : 86.8   Vital Signs: Temp: 99.1 F (37.3 C) (02/23 0700) Temp Source: Oral (02/23 0500) BP: 109/56 mmHg (02/23 1500) Pulse Rate: 80 (02/23 1500) Intake/Output from previous day: 02/22 0701 - 02/23 0700 In: 4236.8 [I.V.:1919.3; NG/GT:2317.5] Out: 3525 [Urine:3525] Intake/Output from this shift: Total I/O In: -  Out: 800 [Urine:800] Vent settings for last 24 hours: Vent Mode:  [-] PRVC FiO2 (%):  [35 %] 35 % Set Rate:  [15 bmp] 15 bmp Vt Set:  [500 mL] 500 mL PEEP:  [5 cmH20] 5 cmH20 Plateau Pressure:  [12 cmH20-15 cmH20] 15 cmH20  Labs:  Recent Labs  12/16/15 0440 12/17/15 0421 12/18/15 0526 12/18/15 1310  WBC 28.5* 36.9*  --   --   HGB 8.4* 8.4*  --   --   HCT 25.8* 26.7*  --   --   PLT 204 190  --   --   CREATININE 2.42* 2.40* 1.91*  --   MG 2.2 2.0 2.0 1.9  PHOS  --  3.3 2.8  --    Estimated Creatinine Clearance: 36.6 mL/min (by C-G formula based on Cr of 1.91).   Recent Labs  12/18/15 0408 12/18/15 0748 12/18/15 1124  GLUCAP 247* 230* 238*    Microbiology: Recent Results (from the past 720 hour(s))  Culture, respiratory (NON-Expectorated)     Status: None   Collection Time: 12/06/15  4:05 PM  Result Value Ref Range Status   Specimen Description NASAL SWAB  Final   Special Requests NONE  Final   Gram Stain RARE WBC SEEN FEW GRAM POSITIVE COCCI   Final   Culture HEAVY GROWTH STAPHYLOCOCCUS AUREUS  Final   Report Status 12/09/2015 FINAL  Final   Organism ID, Bacteria STAPHYLOCOCCUS AUREUS  Final      Susceptibility   Staphylococcus aureus - MIC*    CIPROFLOXACIN 2 INTERMEDIATE Intermediate     GENTAMICIN <=0.5 SENSITIVE Sensitive     OXACILLIN <=0.25 SENSITIVE Sensitive     TETRACYCLINE <=1 SENSITIVE Sensitive      VANCOMYCIN 1 SENSITIVE Sensitive     TRIMETH/SULFA <=10 SENSITIVE Sensitive     RIFAMPIN <=0.5 SENSITIVE Sensitive     Inducible Clindamycin NEGATIVE Sensitive     * HEAVY GROWTH STAPHYLOCOCCUS AUREUS  Culture, blood (Routine X 2) w Reflex to ID Panel     Status: None   Collection Time: 12/07/15 12:09 AM  Result Value Ref Range Status   Specimen Description BLOOD BLOOD RIGHT HAND  Final   Special Requests BOTTLES DRAWN AEROBIC AND ANAEROBIC 10CC  Final   Culture NO GROWTH 6 DAYS  Final   Report Status 12/13/2015 FINAL  Final  Culture, blood (Routine X 2) w Reflex to ID Panel     Status: None   Collection Time: 12/07/15 12:09 AM  Result Value Ref Range Status   Specimen Description BLOOD BLOOD LEFT HAND  Final   Special Requests BOTTLES DRAWN AEROBIC AND ANAEROBIC 10CC  Final   Culture NO GROWTH 6 DAYS  Final   Report Status 12/13/2015 FINAL  Final  Urine culture     Status: None   Collection Time: 12/07/15 12:11 AM  Result Value Ref Range Status   Specimen Description URINE, CLEAN CATCH  Final   Special Requests NONE  Final   Culture NO GROWTH 1 DAY  Final   Report Status 12/08/2015 FINAL  Final  MRSA PCR Screening     Status: None   Collection Time: 12/08/15  9:12 AM  Result Value Ref Range Status   MRSA by PCR NEGATIVE NEGATIVE Final    Comment:        The GeneXpert MRSA Assay (FDA approved for NASAL specimens only), is one component of a comprehensive MRSA colonization surveillance program. It is not intended to diagnose MRSA infection nor to guide or monitor treatment for MRSA infections.   C difficile quick scan w PCR reflex     Status: None   Collection Time: 12/11/15  1:54 PM  Result Value Ref Range Status   C Diff antigen NEGATIVE NEGATIVE Final   C Diff toxin NEGATIVE NEGATIVE Final   C Diff interpretation Negative for C. difficile  Final  Culture, blood (Routine X 2) w Reflex to ID Panel     Status: None   Collection Time: 12/13/15  9:33 AM  Result Value  Ref Range Status   Specimen Description BLOOD LEFT ANTECUBITAL  Final   Special Requests BOTTLES DRAWN AEROBIC AND ANAEROBIC  4CC  Final   Culture NO GROWTH 5 DAYS  Final   Report Status 12/18/2015 FINAL  Final  Culture, blood (Routine X 2) w Reflex to ID Panel     Status: None   Collection Time: 12/13/15  9:41 AM  Result Value Ref Range Status   Specimen Description BLOOD RIGHT ARM  Final   Special Requests BOTTLES DRAWN AEROBIC AND ANAEROBIC  2CC  Final   Culture NO GROWTH 5 DAYS  Final   Report Status 12/18/2015 FINAL  Final  Urine culture     Status: None   Collection Time: 12/13/15 11:01 AM  Result Value Ref Range Status   Specimen Description URINE, CATHETERIZED  Final   Special Requests NONE  Final   Culture NO GROWTH 1 DAY  Final   Report Status 12/14/2015 FINAL  Final  Culture, expectorated sputum-assessment     Status: None   Collection Time: 12/13/15 11:30 AM  Result Value Ref Range Status   Specimen Description TRACHEAL ASPIRATE  Final   Special Requests NONE  Final   Sputum evaluation THIS SPECIMEN IS ACCEPTABLE FOR SPUTUM CULTURE  Final   Report Status 12/13/2015 FINAL  Final  Culture, respiratory (NON-Expectorated)     Status: None   Collection Time: 12/13/15 11:30 AM  Result Value Ref Range Status   Specimen Description TRACHEAL ASPIRATE  Final   Special Requests NONE Reflexed from [id number]  Final   Gram Stain   Final    FEW WBC SEEN FEW GRAM POSITIVE COCCI FEW YEAST FAIR SPECIMEN - 70-80% WBCS    Culture LIGHT GROWTH CANDIDA ALBICANS  Final   Report Status 12/17/2015 FINAL  Final  Cath Tip Culture     Status: None   Collection Time: 12/14/15 12:47 PM  Result Value Ref Range Status   Specimen Description CATH TIP  Final   Special Requests NONE  Final   Culture NO GROWTH 3 DAYS  Final   Report Status 12/17/2015 FINAL  Final  Urine culture     Status: None   Collection Time: 12/16/15  8:19 AM  Result Value Ref Range Status   Specimen Description URINE,  RANDOM  Final   Special Requests Normal  Final   Culture NO GROWTH 1 DAY  Final   Report Status 12/17/2015 FINAL  Final  CULTURE, BLOOD (ROUTINE X 2) w Reflex to PCR ID Panel     Status: None (Preliminary result)   Collection Time: 12/16/15  8:28 AM  Result Value Ref Range Status   Specimen Description BLOOD LEFT ARM  Final   Special Requests BOTTLES DRAWN AEROBIC AND ANAEROBIC 10ML  Final   Culture NO GROWTH 2 DAYS  Final   Report Status PENDING  Incomplete  CULTURE, BLOOD (ROUTINE X 2) w Reflex to PCR ID Panel     Status: None (Preliminary result)   Collection Time: 12/16/15  8:45 AM  Result Value Ref Range Status   Specimen Description BLOOD RIGHT ARM  Final   Special Requests BOTTLES DRAWN AEROBIC AND ANAEROBIC 10ML  Final   Culture NO GROWTH 2 DAYS  Final   Report Status PENDING  Incomplete  C difficile quick scan w PCR reflex     Status: None   Collection Time: 12/16/15  4:26 PM  Result Value Ref Range Status   C Diff antigen NEGATIVE NEGATIVE Final   C Diff toxin NEGATIVE NEGATIVE Final   C Diff interpretation Negative for C. difficile  Final  Culture, expectorated sputum-assessment     Status: None   Collection Time: 12/16/15  5:32 PM  Result Value Ref Range Status   Specimen Description SPUTUM  Final   Special Requests NONE  Final   Sputum evaluation THIS SPECIMEN IS ACCEPTABLE FOR SPUTUM CULTURE  Final   Report Status 12/16/2015 FINAL  Final  Culture, respiratory (NON-Expectorated)     Status: None (Preliminary result)   Collection Time: 12/16/15  5:32 PM  Result Value Ref Range Status   Specimen Description SPUTUM  Final   Special Requests NONE Reflexed from [id number]  Final   Gram Stain   Final    MANY WBC SEEN RARE SQUAMOUS EPITHELIAL CELLS PRESENT FEW YEAST EXCELLENT SPECIMEN - 90-100% WBCS    Culture HOLDING FOR POSSIBLE PATHOGEN  Final   Report Status PENDING  Incomplete  MRSA PCR Screening     Status: None   Collection Time: 12/17/15 11:50 AM  Result Value  Ref Range Status   MRSA by PCR NEGATIVE NEGATIVE Final    Comment:        The GeneXpert MRSA Assay (FDA approved for NASAL specimens only), is one component of a comprehensive MRSA colonization surveillance program. It is not intended to diagnose MRSA infection nor to guide or monitor treatment for MRSA infections.   Culture, blood (Routine X 2) w Reflex to ID Panel     Status: None (Preliminary result)   Collection Time: 12/18/15  4:29 AM  Result Value Ref Range Status   Specimen Description BLOOD RIGHT HAND  Final   Special Requests BOTTLES DRAWN AEROBIC AND ANAEROBIC 5ML  Final   Culture NO GROWTH <12 HOURS  Final   Report Status PENDING  Incomplete  Culture, blood (Routine X 2) w Reflex to ID Panel     Status: None (Preliminary result)   Collection Time: 12/18/15  4:37 AM  Result Value Ref Range Status   Specimen Description BLOOD LEFT ARM  Final   Special Requests BOTTLES DRAWN AEROBIC AND ANAEROBIC 10ML  Final   Culture NO GROWTH <12 HOURS  Final   Report Status PENDING  Incomplete    Medications:  Scheduled:  . amiodarone  150 mg Intravenous Once  . amiodarone  200 mg Oral BID  . antiseptic oral rinse  7 mL Mouth Rinse QID  . aspirin  162 mg Oral Daily  . cefTAZidime (FORTAZ)  IV  1 g Intravenous Once  . cefTAZidime (FORTAZ)  IV  1 g Intravenous Q12H  . chlorhexidine gluconate  15 mL Mouth Rinse BID  . [START ON 12/19/2015] feeding supplement (PRO-STAT SUGAR FREE 64)  30 mL Per Tube Daily  . free water  200 mL Per Tube Q4H  . heparin subcutaneous  5,000 Units Subcutaneous Q12H  . insulin aspart  0-20 Units Subcutaneous 6 times per day  . insulin glargine  18 Units Subcutaneous Daily  . metoprolol  2.5 mg Intravenous 4 times per day  . metronidazole  500 mg Intravenous Q8H  . pantoprazole sodium  40 mg Per Tube Daily  . sodium chloride  500 mL Intravenous Once  . vancomycin  1,250 mg Intravenous Once  . [START ON 12/19/2015] vancomycin  1,000 mg Intravenous Q24H    Infusions:  . 0.45 % NaCl with KCl 20 mEq / L 100 mL/hr at 12/18/15 1030  . feeding supplement (VITAL HIGH PROTEIN) 1,000 mL (12/17/15 2200)  . nitroGLYCERIN    . propofol (DIPRIVAN) infusion 30 mcg/kg/min (12/18/15 1237)    Assessment: Pharmacy consulted for electrolyte management for 70 yo male ICU patient requiring mechanical ventilation.     Plan:  Will order potassium 65mq VT x 1. Will recheck electrolytes with am labs.   Pharmacy will continue to monitor and adjust per consult.    Melaina Howerton L 12/18/2015,4:21 PM

## 2015-12-18 NOTE — Progress Notes (Signed)
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 Now with acute respiratory failure, intubated due to pneumonia.  ASSESSMENT / PLAN:  PULMONARY A: Acute hypoxic respiratory failure due to pneumonia with sepsis.  The patient is currently on a weaning trial of pressure support of 5/5, currently he is tachypneic at a rate of 30-35, and appears to be uncomfortable.   Intubated 2/11>>2/18 Reintubated 2/21>>  P:  -Continue broad-spectrum antibiotics, recheck blood, sputum and urine cultures. -We'll DC weaning trial, as the patient appears to have failed. This is likely due to changes of pneumonia, as well as reduced mental status. -Discussed with family the need for a tracheostomy. They would like to wait the full 2 weeks of ventilation, which would be 2/28. They they are going to discuss the possibility of tracheostomy and will inform us of their decision.  CARDIOVASCULAR-NSTEMI A:  Status post NSTEMI Afib Short runs of V-tach P:  -Continue amiodarone maintenance dose. -Cardiology following - rec low dose ASA - aspirin suppository. Metoprolol prn.   RENAL A:  AKI secondary procedural IV dye and volume depletion; baseline creatinine 0.83 P:  -IVF, advance diet.  -Trend creatinine - currently improving -Renally dose meds -nephro following - vascath placed removed 02/19.   GASTROINTESTINAL A:  Pt now having diarrhea, (was receiving bowel regimen for ileus).  P:  -PPI for GI prophylaxis -Reduce bowel agents.  -continue tube feeds.   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  Fever resolved, leukocytosis, improving. P:  -Antibiotics as above -F/U cultures of blood, urine. --consulted ID for persistent fever and leukocytosis.   CULTURES: 02/11>Blood>NTD 02/11>Sputum>STAPHYLOCOCCUS AUREUS 02/18 Blood x2>> negative 02/18 Urine>> negative 02/18 Respiratory>> yeast 2/20, catheter tip culture >> negative thus far 2/21. Urine culture>> pending Sputum culture 2/21>> pending Repeat MRSA 2/22>> negative 2/23: Repeat blood cultures>> negative thus far. 2/23: Urine culture>> pending. 2/23: Respiratory culture>> pending   ANTIBIOTICS:  Ceftaz 2/21>> Vancomycin 2/21>>2/24.   LINES:  -Urethral catheter, 7 days.  -RIJ, 11 days.   ENDOCRINE A:  Type 2 diabetes mellitus With hyperglycemia.  Hypernatremia has improved. Hyperglycemia has been better controlled with long-acting insulin. 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 H/O Seizures-Not on home seizure meds  Encephalopathy secondary to hypercapnia and likely due to recent CVA.  CT head images and report reviewed, continued bilateral cerebellar infarcts with atrophy.  P:  Routine supportive care. Wakeup assessment to assess mental status. His prognosis appears poor given severity of CVA as well as other comorbidities.   Repeat CT head 2/22:  IMPRESSION: 1. 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 2. No new or progressive findings. 3. Atrophy with chronic small vessel white matter  ischemic disease.   SOCIAL: Prognosis appears poor given multiple problems as well as CVA. If continued aggressive care  is desired, will need trach/peg/ placement. Updated the son and daughter regarding the patient's status. They appeared to understand the patients condition. Son asked if I have ever seen someone like his father recover, I informed him that I have not. His most likely outcome would be that he would go to an LTAC with trach/peg, where he would  remain, until he died. Son would still like to wait for the time being to see if God can heal him as he does not want to give up. Pt will remain full code. Pt is not ready for extubation at this time as he is high risk for aspiration and reintubation.  They would like to wait the full 14 days which would be 2/28 to see if he can make a recovery. They will consider whether to proceed with the tracheostomy and inform us of their decision.     STUDIES:  02/12: 2-D ZOXW>96-04%; grade 1 diastolic dysfunction, LVH    SIGNIFICANT EVENTS: 02/07>ED with SOB and left calf pain>treated and released 02/09>ED with cold left foot and calf pain>CT angio>complete occlusion of the left fem-pop bypass graft>admitted 02/11>OR for angiogram with thrombolysis, thrombectomy of left peroneal artery, tibioperoneal trunk, popliteal artery, and femoral to popliteal bypass graft and percutaneous transluminal angioplasty of the left peroneal artery and distal bypass anastomosis and popliteal artery Post-op: residual thrombosis of femoral to popliteal bypass graft and all tibial vessels; acute respiratory failure>Intubated  ---------------------------------------   ----------------------------------------   Name: Stephen Camacho MRN: [id number] DOB: [birth year]    ADMISSION DATE:  12/05/2015    CHIEF COMPLAINT: dyspnea.      SUBJECTIVE:   Pt currently on the ventilator, can not provide history or review of systems.    VITAL SIGNS: Temp:   [99.1 F (37.3 C)-102 F (38.9 C)] 99.7 F (37.6 C) (02/23 1800) Pulse Rate:  [75-94] 75 (02/23 1800) Resp:  [19-33] 33 (02/23 1800) BP: (94-132)/(53-69) 113/56 mmHg (02/23 1800) SpO2:  [97 %-100 %] 100 % (02/23 1916) FiO2 (%):  [35 %] 35 % (02/23 1916) HEMODYNAMICS:   VENTILATOR SETTINGS: Vent Mode:  [-] PRVC FiO2 (%):  [35 %] 35 % Set Rate:  [15 bmp] 15 bmp Vt Set:  [500 mL] 500 mL PEEP:  [5 cmH20] 5 cmH20 Plateau Pressure:  [12 cmH20-15 cmH20] 15 cmH20 INTAKE / OUTPUT:  Intake/Output Summary (Last 24 hours) at 12/18/15 1936 Last data filed at 12/18/15 1834  Gross per 24 hour  Intake 2218.8 ml  Output   3800 ml  Net -1581.2 ml    PHYSICAL EXAMINATION: Physical Examination:   VS: BP 113/56 mmHg  Pulse 75  Temp(Src) 99.7 F (37.6 C) (Oral)  Resp 33  Ht '6\' 4"'$  (1.93 m)  Wt 158 lb 11.7 oz (72 kg)  BMI 19.33 kg/m2  SpO2 100%  General Appearance: No distress  Neuro:without focal findings, mental status normal. HEENT: PERRLA, EOM intact. Pulmonary: normal breath sounds   CardiovascularNormal S1,S2.  No m/r/g.   Abdomen: Benign, Soft, non-tender. Renal:  No costovertebral tenderness  GU:  Not performed at this time. Endocrine: No evident thyromegaly. Skin:   warm, no rashes, no ecchymosis  Extremities: normal, no cyanosis, clubbing.   LABS:   LABORATORY PANEL:   CBC  Recent Labs Lab 12/17/15 0421  WBC 36.9*  HGB 8.4*  HCT 26.7*  PLT 190    Chemistries   Recent Labs Lab 12/18/15 0526  12/18/15 1310  NA 153*  153*  < > 150*  K 3.4*  --   --  CL 118*  --   --   CO2 28  --   --   GLUCOSE 267*  --   --   BUN 42*  --   --   CREATININE 1.91*  --   --   CALCIUM 7.0*  --   --   MG 2.0  --  1.9  PHOS 2.8  --   --   < > = values in this interval not displayed.   Recent Labs Lab 12/17/15 2002 12/17/15 2348 12/18/15 0408 12/18/15 0748 12/18/15 1124 12/18/15 1629  GLUCAP 169* 225* 247* 230* 238* 180*    Recent Labs Lab 12/16/15 1430  12/17/15 0445 12/18/15 0500  PHART 7.48* 7.52* 7.39  PCO2ART 36 34 45  PO2ART 83 58* 81*   No results for input(s): AST, ALT, ALKPHOS, BILITOT, ALBUMIN in the last 168 hours.  Cardiac Enzymes  Recent Labs Lab 12/16/15 0440  TROPONINI 2.70*    RADIOLOGY:  Dg Chest 1 View  12/18/2015  CLINICAL DATA:  Shortness of breath. EXAM: CHEST 1 VIEW COMPARISON:  12/17/2015. FINDINGS: Endotracheal tube, right IJ line, NG tube in stable position. Prior CABG. Persistent but improving bilateral pulmonary infiltrates consistent with improving congestive heart failure. Bilateral pneumonia cannot be excluded. Small left pleural effusion. No pneumothorax. IMPRESSION: 1. Lines and tubes in stable position. 2. Prior CABG. Stable cardiomegaly. Persistent but slightly improving bilateral pulmonary infiltrates and left pleural effusion. Findings consistent with improving congestive heart failure. Electronically Signed   By: Marcello Moores  Register   On: 12/18/2015 07:05   Dg Chest 1 View  12/17/2015  CLINICAL DATA:  Acute onset of dyspnea.  Initial encounter. EXAM: CHEST 1 VIEW COMPARISON:  Chest radiograph performed 12/16/2015 FINDINGS: The patient's endotracheal tube is seen ending 3-4 cm above the carina. A right IJ line is noted ending about the distal SVC. An enteric tube is noted extending below the diaphragm. Mildly worsened patchy bilateral airspace opacities are noted, more prominent on the left, concerning for worsening pneumonia. Given prior appearance, this could also reflect interstitial edema. No definite pleural effusion or pneumothorax is seen. The cardiomediastinal silhouette is mildly enlarged. The patient is status post median sternotomy. No acute osseous abnormalities are seen. IMPRESSION: 1. Endotracheal tube seen ending 3-4 cm above the carina. 2. Right IJ line noted ending about the distal SVC. 3. Mildly worsened patchy bilateral airspace opacities, more prominent on the left, concerning for worsening  pneumonia. Given prior appearance, this could also reflect interstitial edema. Electronically Signed   By: Garald Balding M.D.   On: 12/17/2015 04:12   Ct Head Wo Contrast  12/17/2015  CLINICAL DATA:  Recent nonhemorrhagic infarct involving the right PCA territory and bilateral cerebellum, left greater than right. EXAM: CT HEAD WITHOUT CONTRAST TECHNIQUE: Contiguous axial images were obtained from the base of the skull through the vertex without intravenous contrast. COMPARISON:  12/07/2015 FINDINGS: Interval evolution of bilateral cerebellar infarcts now shows the right cerebellar infarct to the larger than that on the left. No evidence for interval progression on either side. No evidence for hemorrhagic transformation. The cytotoxic edema is seen in the right PCA territory on the previous study shows continued evolution with more noticeable low-density in this territory today. No evidence for associated hemorrhage. No evidence for new acute infarct. No evidence for hydrocephalus or midline shift. Diffuse loss of parenchymal volume is consistent with atrophy. Patchy low attenuation in the deep hemispheric and periventricular white matter is nonspecific, but likely reflects chronic microvascular ischemic demyelination.  The visualized paranasal sinuses and mastoid air cells are clear. IMPRESSION: 1. 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 2. No new or progressive findings. 3. Atrophy with chronic small vessel white matter ischemic disease. Electronically Signed   By: Misty Stanley M.D.   On: 12/17/2015 18:42       --Marda Stalker, MD.  [facility]  Patricia Pesa, M.D.  Vilinda Boehringer, M.D.  Merton Border, M.D   [place].  I have personally obtained a history, examined the patient, evaluated laboratory and imaging results, formulated the assessment and plan and placed orders. The Patient requires high complexity decision  making for assessment and support, frequent evaluation and titration of therapies, application of advanced monitoring technologies and extensive interpretation of multiple databases. The patient has critical illness that could lead imminently to failure of 1 or more organ systems and requires the highest level of physician preparedness to intervene.  Critical Care Time devoted to patient care services described in this note is 35 minutes and is exclusive of time spent in procedures.

## 2015-12-18 NOTE — Progress Notes (Signed)
Nutrition Follow-up   INTERVENTION:   EN: with current diprivan, recommend continuing TF at current goal rate of TF, decrease Prostat to daily. Meets 100% of estiamted calorie and protein needs per ASPEN guidelines. Continuet o assess   NUTRITION DIAGNOSIS:   Inadequate oral intake related to inability to eat as evidenced by NPO status. 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  Pt remains on vent  Diet Order:  Diet NPO time specified   EN: tolerating Vital High Protein at rate of 45 ml/hr with Prostat TID, free water flushes being adjusted by MD  Meds: ss novolog, lantus  Last BM:  12/07/2015    Recent Labs Lab 12/15/15 0500 12/16/15 0440 12/17/15 0421  12/18/15 0526 12/18/15 0904 12/18/15 1310  NA 147* 151* 154*  < > 153*  153* 151* 150*  K 3.6 3.5 2.6*  --  3.4*  --   --   CL 115* 118* 116*  --  118*  --   --   CO2 22 21* 27  --  28  --   --   BUN 50* 60* 52*  --  42*  --   --   CREATININE 2.37* 2.42* 2.40*  --  1.91*  --   --   CALCIUM 7.3* 7.4* 7.3*  --  7.0*  --   --   MG 2.0 2.2 2.0  --  2.0  --  1.9  PHOS 3.7  --  3.3  --  2.8  --   --   GLUCOSE 172* 229* 232*  --  267*  --   --   < > = values in this interval not displayed.  Glucose Profile:  Recent Labs  12/18/15 0408 12/18/15 0748 12/18/15 1124  GLUCAP 247* 230* 238*    Meds: 1/2 NS at 100 ml/hr, ss novolog, lantus, diprivan (520 kcals in 24 hours)  Height:   Ht Readings from Last 1 Encounters:  12/05/15 '6\' 4"'$  (1.93 m)    Weight:   Wt Readings from Last 1 Encounters:  12/02/15 176 lb (79.833 kg)    BMI:  Body mass index is 19.33 kg/(m^2).  Estimated Nutritional Needs:   Kcal:  1711 kcals (Ve: 15.6, Tmax: 38.6) using wt of 72 kg, legnth of 44 inches  Protein:  108-144 g (1.5-2.0 g/kg)   Fluid:  1800-2160 mL (25-30 ml/kg)   HIGH Care Level  Kerman Passey MS, RD, LDN [phone] Pager  [phone] Weekend/On-Call Pager

## 2015-12-18 NOTE — Progress Notes (Signed)
RN called [facility] and spoke with Elta Guadeloupe, RN and made nurse aware that patient's fever is now 102 and that [facility] doctor did not ever order anything for fever. Elta Guadeloupe, RN stated he would let the doctor know.

## 2015-12-18 NOTE — Progress Notes (Signed)
[address] Progress Note Patient Name: Stephen Camacho DOB: [birth year] MRN: [id number]   Date of Service  12/18/2015  HPI/Events of Note  Temp up to 102. WBC count, procalcitonin elevated. Pt is already on vanco, ceftaz  eICU Interventions  Check blood, urine, sputum cultures. Add ibuprofen to tylenol.     Intervention Category Intermediate Interventions: Other:  Draco Malczewski 12/18/2015, 12:11 AM

## 2015-12-19 ENCOUNTER — Inpatient Hospital Stay: Payer: PPO

## 2015-12-19 LAB — BASIC METABOLIC PANEL
Anion gap: 6 (ref 5–15)
BUN: 35 mg/dL — AB (ref 6–20)
CALCIUM: 6.8 mg/dL — AB (ref 8.9–10.3)
CO2: 24 mmol/L (ref 22–32)
Chloride: 118 mmol/L — ABNORMAL HIGH (ref 101–111)
Creatinine, Ser: 1.35 mg/dL — ABNORMAL HIGH (ref 0.61–1.24)
GFR calc Af Amer: 60 mL/min — ABNORMAL LOW (ref >60)
GFR, EST NON AFRICAN AMERICAN: 52 mL/min — AB (ref >60)
GLUCOSE: 166 mg/dL — AB (ref 65–99)
POTASSIUM: 3.1 mmol/L — AB (ref 3.5–5.1)
Sodium: 148 mmol/L — ABNORMAL HIGH (ref 135–145)

## 2015-12-19 LAB — GLUCOSE, CAPILLARY
GLUCOSE-CAPILLARY: 147 mg/dL — AB (ref 65–99)
GLUCOSE-CAPILLARY: 167 mg/dL — AB (ref 65–99)
Glucose-Capillary: 131 mg/dL — ABNORMAL HIGH (ref 65–99)
Glucose-Capillary: 147 mg/dL — ABNORMAL HIGH (ref 65–99)
Glucose-Capillary: 159 mg/dL — ABNORMAL HIGH (ref 65–99)
Glucose-Capillary: 215 mg/dL — ABNORMAL HIGH (ref 65–99)

## 2015-12-19 LAB — POTASSIUM: Potassium: 3.8 mmol/L (ref 3.5–5.1)

## 2015-12-19 LAB — CBC
HCT: 24.9 % — ABNORMAL LOW (ref 40.0–52.0)
Hemoglobin: 7.8 g/dL — ABNORMAL LOW (ref 13.0–18.0)
MCH: 27.7 pg (ref 26.0–34.0)
MCHC: 31.2 g/dL — AB (ref 32.0–36.0)
MCV: 88.8 fL (ref 80.0–100.0)
PLATELETS: 195 10*3/uL (ref 150–440)
RBC: 2.8 MIL/uL — ABNORMAL LOW (ref 4.40–5.90)
RDW: 17 % — AB (ref 11.5–14.5)
WBC: 28.9 10*3/uL — ABNORMAL HIGH (ref 3.8–10.6)

## 2015-12-19 LAB — URINE CULTURE: CULTURE: NO GROWTH

## 2015-12-19 LAB — TRIGLYCERIDES: TRIGLYCERIDES: 136 mg/dL (ref <150)

## 2015-12-19 MED ORDER — DEXTROSE 5 % IV SOLN
1.0000 g | Freq: Three times a day (TID) | INTRAVENOUS | Status: DC
  Filled 2015-12-19 (×2): qty 1

## 2015-12-19 MED ORDER — PIPERACILLIN-TAZOBACTAM 3.375 G IVPB
3.3750 g | Freq: Three times a day (TID) | INTRAVENOUS | Status: AC
Start: 2015-12-19 — End: 2016-01-03
  Administered 2015-12-19 – 2016-01-03 (×45): 3.375 g via INTRAVENOUS
  Filled 2015-12-19 (×50): qty 50

## 2015-12-19 MED ORDER — POTASSIUM CHLORIDE 20 MEQ/15ML (10%) PO SOLN
40.0000 meq | Freq: Once | ORAL | Status: AC
  Administered 2015-12-19: 40 meq via ORAL
  Filled 2015-12-19: qty 30

## 2015-12-19 NOTE — Progress Notes (Signed)
Stephen Camacho INFECTIOUS DISEASE PROGRESS NOTE Date of Admission:  12/05/2015     ID: Stephen Camacho is a 70 y.o. male with Active Problems:   Ischemic leg   Acute respiratory failure (HCC)   Ischemia of lower extremity   Altered mental status   Hematoma of neck   Occlusion of arterial bypass graft (HCC)   NSTEMI (non-ST elevated myocardial infarction) ([place])   Severe sepsis (HCC)   Urinary retention   Aspiration into respiratory tract   Encounter for intubation   Subjective: Remains intubated No fevers since 2/22  ROS  Eleven systems are reviewed and negative except per hpi  Medications:  Antibiotics Given (last 72 hours)    Date/Time Action Medication Dose Rate   12/17/15 0246 Given   vancomycin (VANCOCIN) 1,250 mg in sodium chloride 0.9 % 250 mL IVPB 1,250 mg 166.7 mL/hr   12/17/15 0941 Given   cefTAZidime (FORTAZ) 1 g in dextrose 5 % 50 mL IVPB 1 g 100 mL/hr   12/18/15 0909 Given   cefTAZidime (FORTAZ) 1 g in dextrose 5 % 50 mL IVPB 1 g 100 mL/hr   12/18/15 1313 Given   vancomycin (VANCOCIN) 1,250 mg in sodium chloride 0.9 % 250 mL IVPB 1,250 mg 166.7 mL/hr   12/18/15 1726 Given   metroNIDAZOLE (FLAGYL) IVPB 500 mg 500 mg 100 mL/hr   12/18/15 2208 Given   cefTAZidime (FORTAZ) 1 g in dextrose 5 % 50 mL IVPB 1 g 100 mL/hr   12/18/15 2306 Given   metroNIDAZOLE (FLAGYL) IVPB 500 mg 500 mg 100 mL/hr   12/19/15 0606 Given   metroNIDAZOLE (FLAGYL) IVPB 500 mg 500 mg 100 mL/hr   12/19/15 0901 Given   cefTAZidime (FORTAZ) 1 g in dextrose 5 % 50 mL IVPB 1 g 100 mL/hr     . amiodarone  150 mg Intravenous Once  . amiodarone  200 mg Oral BID  . antiseptic oral rinse  7 mL Mouth Rinse QID  . aspirin  162 mg Oral Daily  . cefTAZidime (FORTAZ)  IV  1 g Intravenous Once  . cefTAZidime (FORTAZ)  IV  1 g Intravenous Q8H  . chlorhexidine gluconate  15 mL Mouth Rinse BID  . feeding supplement (PRO-STAT SUGAR FREE 64)  30 mL Per Tube Daily  . free water  200 mL Per Tube Q4H  .  heparin subcutaneous  5,000 Units Subcutaneous Q12H  . insulin aspart  0-20 Units Subcutaneous 6 times per day  . insulin glargine  18 Units Subcutaneous Daily  . metoprolol  2.5 mg Intravenous 4 times per day  . metronidazole  500 mg Intravenous Q8H  . pantoprazole sodium  40 mg Per Tube Daily  . sodium chloride  500 mL Intravenous Once    Objective: Vital signs in last 24 hours: Temp:  [99.3 F (37.4 C)-99.7 F (37.6 C)] 99.7 F (37.6 C) (02/24 0700) Pulse Rate:  [75-84] 77 (02/24 0700) Resp:  [26-33] 30 (02/24 0700) BP: (107-124)/(54-67) 120/64 mmHg (02/24 0700) SpO2:  [97 %-100 %] 100 % (02/24 0700) FiO2 (%):  [35 %] 35 % (02/24 1109) Constitutional: intubated, critically ill appearing HENT: anicteric Mouth/Throat: ETT in place R neck IJ Cardiovascular: Normal rate, regular rhythm and normal heart sounds. Pulmonary/Chest: mech BS Abdominal: Soft. Bowel sounds are normal. He exhibits no distension. There is no tenderness.  Lymphadenopathy: He has no cervical adenopathy.  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 No rash  noted. No erythema.  Psychiatric: intubated  Lab Results  Recent Labs  12/17/15 0421  12/18/15 0526  12/18/15 1310 12/19/15 0600  WBC 36.9*  --   --   --   --  28.9*  HGB 8.4*  --   --   --   --  7.8*  HCT 26.7*  --   --   --   --  24.9*  NA 154*  < > 153*  153*  < > 150* 148*  K 2.6*  --  3.4*  --   --  3.1*  CL 116*  --  118*  --   --  118*  CO2 27  --  28  --   --  24  BUN 52*  --  42*  --   --  35*  CREATININE 2.40*  --  1.91*  --   --  1.35*  < > = values in this interval not displayed.  Microbiology: Results for orders placed or performed during the hospital encounter of 12/05/15  Culture, respiratory (NON-Expectorated)     Status: None   Collection Time: 12/06/15  4:05 PM  Result Value Ref Range Status   Specimen Description NASAL SWAB  Final   Special Requests NONE  Final   Gram  Stain RARE WBC SEEN FEW GRAM POSITIVE COCCI   Final   Culture HEAVY GROWTH STAPHYLOCOCCUS AUREUS  Final   Report Status 12/09/2015 FINAL  Final   Organism ID, Bacteria STAPHYLOCOCCUS AUREUS  Final      Susceptibility   Staphylococcus aureus - MIC*    CIPROFLOXACIN 2 INTERMEDIATE Intermediate     GENTAMICIN <=0.5 SENSITIVE Sensitive     OXACILLIN <=0.25 SENSITIVE Sensitive     TETRACYCLINE <=1 SENSITIVE Sensitive     VANCOMYCIN 1 SENSITIVE Sensitive     TRIMETH/SULFA <=10 SENSITIVE Sensitive     RIFAMPIN <=0.5 SENSITIVE Sensitive     Inducible Clindamycin NEGATIVE Sensitive     * HEAVY GROWTH STAPHYLOCOCCUS AUREUS  Culture, blood (Routine X 2) w Reflex to ID Panel     Status: None   Collection Time: 12/07/15 12:09 AM  Result Value Ref Range Status   Specimen Description BLOOD BLOOD RIGHT HAND  Final   Special Requests BOTTLES DRAWN AEROBIC AND ANAEROBIC 10CC  Final   Culture NO GROWTH 6 DAYS  Final   Report Status 12/13/2015 FINAL  Final  Culture, blood (Routine X 2) w Reflex to ID Panel     Status: None   Collection Time: 12/07/15 12:09 AM  Result Value Ref Range Status   Specimen Description BLOOD BLOOD LEFT HAND  Final   Special Requests BOTTLES DRAWN AEROBIC AND ANAEROBIC 10CC  Final   Culture NO GROWTH 6 DAYS  Final   Report Status 12/13/2015 FINAL  Final  Urine culture     Status: None   Collection Time: 12/07/15 12:11 AM  Result Value Ref Range Status   Specimen Description URINE, CLEAN CATCH  Final   Special Requests NONE  Final   Culture NO GROWTH 1 DAY  Final   Report Status 12/08/2015 FINAL  Final  MRSA PCR Screening     Status: None   Collection Time: 12/08/15  9:12 AM  Result Value Ref Range Status   MRSA by PCR NEGATIVE NEGATIVE Final    Comment:        The GeneXpert MRSA Assay (FDA approved for NASAL specimens only), is one component of a comprehensive MRSA colonization surveillance program. It  is not intended to diagnose MRSA infection nor to guide  or monitor treatment for MRSA infections.   C difficile quick scan w PCR reflex     Status: None   Collection Time: 12/11/15  1:54 PM  Result Value Ref Range Status   C Diff antigen NEGATIVE NEGATIVE Final   C Diff toxin NEGATIVE NEGATIVE Final   C Diff interpretation Negative for C. difficile  Final  Culture, blood (Routine X 2) w Reflex to ID Panel     Status: None   Collection Time: 12/13/15  9:33 AM  Result Value Ref Range Status   Specimen Description BLOOD LEFT ANTECUBITAL  Final   Special Requests BOTTLES DRAWN AEROBIC AND ANAEROBIC  4CC  Final   Culture NO GROWTH 5 DAYS  Final   Report Status 12/18/2015 FINAL  Final  Culture, blood (Routine X 2) w Reflex to ID Panel     Status: None   Collection Time: 12/13/15  9:41 AM  Result Value Ref Range Status   Specimen Description BLOOD RIGHT ARM  Final   Special Requests BOTTLES DRAWN AEROBIC AND ANAEROBIC  2CC  Final   Culture NO GROWTH 5 DAYS  Final   Report Status 12/18/2015 FINAL  Final  Urine culture     Status: None   Collection Time: 12/13/15 11:01 AM  Result Value Ref Range Status   Specimen Description URINE, CATHETERIZED  Final   Special Requests NONE  Final   Culture NO GROWTH 1 DAY  Final   Report Status 12/14/2015 FINAL  Final  Culture, expectorated sputum-assessment     Status: None   Collection Time: 12/13/15 11:30 AM  Result Value Ref Range Status   Specimen Description TRACHEAL ASPIRATE  Final   Special Requests NONE  Final   Sputum evaluation THIS SPECIMEN IS ACCEPTABLE FOR SPUTUM CULTURE  Final   Report Status 12/13/2015 FINAL  Final  Culture, respiratory (NON-Expectorated)     Status: None   Collection Time: 12/13/15 11:30 AM  Result Value Ref Range Status   Specimen Description TRACHEAL ASPIRATE  Final   Special Requests NONE Reflexed from [id number]  Final   Gram Stain   Final    FEW WBC SEEN FEW GRAM POSITIVE COCCI FEW YEAST FAIR SPECIMEN - 70-80% WBCS    Culture LIGHT GROWTH CANDIDA ALBICANS  Final    Report Status 12/17/2015 FINAL  Final  Cath Tip Culture     Status: None   Collection Time: 12/14/15 12:47 PM  Result Value Ref Range Status   Specimen Description CATH TIP  Final   Special Requests NONE  Final   Culture NO GROWTH 3 DAYS  Final   Report Status 12/17/2015 FINAL  Final  Urine culture     Status: None   Collection Time: 12/16/15  8:19 AM  Result Value Ref Range Status   Specimen Description URINE, RANDOM  Final   Special Requests Normal  Final   Culture NO GROWTH 1 DAY  Final   Report Status 12/17/2015 FINAL  Final  CULTURE, BLOOD (ROUTINE X 2) w Reflex to PCR ID Panel     Status: None (Preliminary result)   Collection Time: 12/16/15  8:28 AM  Result Value Ref Range Status   Specimen Description BLOOD LEFT ARM  Final   Special Requests BOTTLES DRAWN AEROBIC AND ANAEROBIC 10ML  Final   Culture NO GROWTH 3 DAYS  Final   Report Status PENDING  Incomplete  CULTURE, BLOOD (ROUTINE X 2) w Reflex to  PCR ID Panel     Status: None (Preliminary result)   Collection Time: 12/16/15  8:45 AM  Result Value Ref Range Status   Specimen Description BLOOD RIGHT ARM  Final   Special Requests BOTTLES DRAWN AEROBIC AND ANAEROBIC 10ML  Final   Culture NO GROWTH 3 DAYS  Final   Report Status PENDING  Incomplete  C difficile quick scan w PCR reflex     Status: None   Collection Time: 12/16/15  4:26 PM  Result Value Ref Range Status   C Diff antigen NEGATIVE NEGATIVE Final   C Diff toxin NEGATIVE NEGATIVE Final   C Diff interpretation Negative for C. difficile  Final  Culture, expectorated sputum-assessment     Status: None   Collection Time: 12/16/15  5:32 PM  Result Value Ref Range Status   Specimen Description SPUTUM  Final   Special Requests NONE  Final   Sputum evaluation THIS SPECIMEN IS ACCEPTABLE FOR SPUTUM CULTURE  Final   Report Status 12/16/2015 FINAL  Final  Culture, respiratory (NON-Expectorated)     Status: None (Preliminary result)   Collection Time: 12/16/15  5:32  PM  Result Value Ref Range Status   Specimen Description SPUTUM  Final   Special Requests NONE Reflexed from [id number]  Final   Gram Stain   Final    MANY WBC SEEN RARE SQUAMOUS EPITHELIAL CELLS PRESENT FEW YEAST EXCELLENT SPECIMEN - 90-100% WBCS    Culture HOLDING FOR POSSIBLE PATHOGEN  Final   Report Status PENDING  Incomplete  MRSA PCR Screening     Status: None   Collection Time: 12/17/15 11:50 AM  Result Value Ref Range Status   MRSA by PCR NEGATIVE NEGATIVE Final    Comment:        The GeneXpert MRSA Assay (FDA approved for NASAL specimens only), is one component of a comprehensive MRSA colonization surveillance program. It is not intended to diagnose MRSA infection nor to guide or monitor treatment for MRSA infections.   Culture, respiratory (NON-Expectorated)     Status: None (Preliminary result)   Collection Time: 12/18/15 12:15 AM  Result Value Ref Range Status   Specimen Description TRACHEAL ASPIRATE  Final   Special Requests NONE  Final   Gram Stain PENDING  Incomplete   Culture   Final    LIGHT GROWTH YEAST IDENTIFICATION TO FOLLOW ONCE ISOLATED    Report Status PENDING  Incomplete  Urine culture     Status: None   Collection Time: 12/18/15 12:24 AM  Result Value Ref Range Status   Specimen Description URINE, RANDOM  Final   Special Requests NONE  Final   Culture NO GROWTH 1 DAY  Final   Report Status 12/19/2015 FINAL  Final  Culture, blood (Routine X 2) w Reflex to ID Panel     Status: None (Preliminary result)   Collection Time: 12/18/15  4:29 AM  Result Value Ref Range Status   Specimen Description BLOOD RIGHT HAND  Final   Special Requests BOTTLES DRAWN AEROBIC AND ANAEROBIC 5ML  Final   Culture NO GROWTH 1 DAY  Final   Report Status PENDING  Incomplete  Culture, blood (Routine X 2) w Reflex to ID Panel     Status: None (Preliminary result)   Collection Time: 12/18/15  4:37 AM  Result Value Ref Range Status   Specimen Description BLOOD LEFT ARM   Final   Special Requests BOTTLES DRAWN AEROBIC AND ANAEROBIC 10ML  Final   Culture NO GROWTH 1 DAY  Final   Report Status PENDING  Incomplete    Studies/Results: Dg Chest 1 View  12/19/2015  CLINICAL DATA:  Shortness of breath. EXAM: CHEST 1 VIEW COMPARISON:  12/18/2015 . FINDINGS: Endotracheal tube, NG tube, right IJ line in stable position. Prior CABG. Cardiomegaly. Continued partial clearing of bilateral pulmonary infiltrates consistent with partial clearing of pulmonary edema. No pleural effusion or pneumothorax . Surgical sutures noted over the upper lungs. No acute bony abnormality. IMPRESSION: 1. Lines and tubes in stable position. 2. Prior CABG. Cardiomegaly with continued partial clearing of bilateral pulmonary infiltrates consistent with partial clearing of pulmonary edema edema . Electronically Signed   By: Eitzen   On: 12/19/2015 07:13   Dg Chest 1 View  12/18/2015  CLINICAL DATA:  Shortness of breath. EXAM: CHEST 1 VIEW COMPARISON:  12/17/2015. FINDINGS: Endotracheal tube, right IJ line, NG tube in stable position. Prior CABG. Persistent but improving bilateral pulmonary infiltrates consistent with improving congestive heart failure. Bilateral pneumonia cannot be excluded. Small left pleural effusion. No pneumothorax. IMPRESSION: 1. Lines and tubes in stable position. 2. Prior CABG. Stable cardiomegaly. Persistent but slightly improving bilateral pulmonary infiltrates and left pleural effusion. Findings consistent with improving congestive heart failure. Electronically Signed   By: Marcello Moores  Register   On: 12/18/2015 07:05   Ct Head Wo Contrast  12/17/2015  CLINICAL DATA:  Recent nonhemorrhagic infarct involving the right PCA territory and bilateral cerebellum, left greater than right. EXAM: CT HEAD WITHOUT CONTRAST TECHNIQUE: Contiguous axial images were obtained from the base of the skull through the vertex without intravenous contrast. COMPARISON:  12/07/2015 FINDINGS: Interval  evolution of bilateral cerebellar infarcts now shows the right cerebellar infarct to the larger than that on the left. No evidence for interval progression on either side. No evidence for hemorrhagic transformation. The cytotoxic edema is seen in the right PCA territory on the previous study shows continued evolution with more noticeable low-density in this territory today. No evidence for associated hemorrhage. No evidence for new acute infarct. No evidence for hydrocephalus or midline shift. Diffuse loss of parenchymal volume is consistent with atrophy. Patchy low attenuation in the deep hemispheric and periventricular white matter is nonspecific, but likely reflects chronic microvascular ischemic demyelination. The visualized paranasal sinuses and mastoid air cells are clear. IMPRESSION: 1. 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 2. No new or progressive findings. 3. Atrophy with chronic small vessel white matter ischemic disease. Electronically Signed   By: Misty Stanley M.D.   On: 12/17/2015 18:42    Assessment/Plan: Assessment:  Stephen Camacho is a 70 y.o. male ith hx PAD, CAD, DM, admitted with L foot pain and found to have ischemia and occlusion of Fem Pop bypass. Has been admitted for 13 days. 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 Has had persistent fevers and leukocytosis Course complicated by CVA, ARF, MI. Has been on abx since admit with zosyn and vanco from 2/11- 2/19. Was off abx for one day 2/20 there restarted ceftazidime and vanco. Cultures with sputum growing MSSA from 2/11 otherwise negative Amputation site looks very good so doubt source of elevated wbc  WBC down from 37 to 28 Recommendations He has sputum cx 2/21 being held for possible pathogen Change ceftazidime and flagyl to zosyn Cont vanco  Thank you very much for the consult. Will follow with you.  [place], Matthews   12/19/2015, 2:32 PM

## 2015-12-19 NOTE — Progress Notes (Signed)
Spoke with Dr Rockne Menghini this am when decreasing propofol for wake up assessment. Per Dr Quincy Sheehan assessment this am placed back on prop due to patients increased RR/breathing. Family updated this afternoon. Will re-attempt in the am.

## 2015-12-19 NOTE — Progress Notes (Signed)
[facility]  ROUNDING NOTE   Subjective:  Intubated 2/21, now on ventilator Na level is critically high, but improving potassium is still low UOP is good ~ 3500 cc S Cr improved further    Objective:  Vital signs in last 24 hours:  Temp:  [99.3 F (37.4 C)-99.7 F (37.6 C)] 99.7 F (37.6 C) (02/24 0700) Pulse Rate:  [75-84] 76 (02/24 1400) Resp:  [19-33] 19 (02/24 1400) BP: (107-128)/(54-67) 117/65 mmHg (02/24 1400) SpO2:  [97 %-100 %] 100 % (02/24 1400) FiO2 (%):  [35 %] 35 % (02/24 1109)  Weight change:  Filed Weights    Intake/Output: I/O last 3 completed shifts: In: 6405.3 [I.V.:3410.3; MH/DQ:2229; IV Piggyback:350] Out: 7989 [Urine:5180; Stool:875]   Intake/Output this shift:  Total I/O In: -  Out: 1000 [Urine:1000]  Physical Exam: General: Critically ill appearing  Head: moist oral mucosal membranes  E/ENT: ETT,  OGT  Neck: Right central line IJ  Lungs:  Vent assisted  Heart: Tachycardic, regular  Abdomen:  Soft, nontender  Extremities: Right AKA, left AKA (new), ++ dependent edema over extremities  Neurologic: sedated  Skin: No lesions     Foley, rectal tube    Basic Metabolic Panel:  Recent Labs Lab 12/13/15 0510  12/14/15 0213 12/15/15 0500 12/16/15 0440 12/17/15 0421  12/18/15 0244 12/18/15 0526 12/18/15 0904 12/18/15 1310 12/19/15 0600  NA 145  --  148* 147* 151* 154*  < > 152* 153*  153* 151* 150* 148*  K 3.8  --  3.3* 3.6 3.5 2.6*  --   --  3.4*  --   --  3.1*  CL 111  --  112* 115* 118* 116*  --   --  118*  --   --  118*  CO2 28  --  26 22 21* 27  --   --  28  --   --  24  GLUCOSE 298*  --  248* 172* 229* 232*  --   --  267*  --   --  166*  BUN 34*  --  36* 50* 60* 52*  --   --  42*  --   --  35*  CREATININE 1.74*  --  2.02* 2.37* 2.42* 2.40*  --   --  1.91*  --   --  1.35*  CALCIUM 7.3*  --  7.4* 7.3* 7.4* 7.3*  --   --  7.0*  --   --  6.8*  MG  --   < > 1.9 2.0 2.2 2.0  --   --  2.0  --  1.9  --   PHOS 1.7*  --   2.5 3.7  --  3.3  --   --  2.8  --   --   --   < > = values in this interval not displayed.  Liver Function Tests: No results for input(s): AST, ALT, ALKPHOS, BILITOT, PROT, ALBUMIN in the last 168 hours. No results for input(s): LIPASE, AMYLASE in the last 168 hours. No results for input(s): AMMONIA in the last 168 hours.  CBC:  Recent Labs Lab 12/14/15 0213 12/15/15 0500 12/16/15 0440 12/17/15 0421 12/19/15 0600  WBC 17.8* 19.1* 28.5* 36.9* 28.9*  HGB 8.2* 8.1* 8.4* 8.4* 7.8*  HCT 24.8* 24.4* 25.8* 26.7* 24.9*  MCV 86.6 84.4 86.7 86.9 88.8  PLT 188 213 204 190 195    Cardiac Enzymes:  Recent Labs Lab 12/13/15 2020 12/14/15 0213 12/14/15 0809 12/16/15 0440  TROPONINI 2.03* 2.67*  3.00* 2.70*    BNP: Invalid input(s): POCBNP  CBG:  Recent Labs Lab 12/18/15 2011 12/19/15 0023 12/19/15 0400 12/19/15 0743 12/19/15 1207  GLUCAP 168* 215* 159* 131* 147*    Microbiology: Results for orders placed or performed during the hospital encounter of 12/05/15  Culture, respiratory (NON-Expectorated)     Status: None   Collection Time: 12/06/15  4:05 PM  Result Value Ref Range Status   Specimen Description NASAL SWAB  Final   Special Requests NONE  Final   Gram Stain RARE WBC SEEN FEW GRAM POSITIVE COCCI   Final   Culture HEAVY GROWTH STAPHYLOCOCCUS AUREUS  Final   Report Status 12/09/2015 FINAL  Final   Organism ID, Bacteria STAPHYLOCOCCUS AUREUS  Final      Susceptibility   Staphylococcus aureus - MIC*    CIPROFLOXACIN 2 INTERMEDIATE Intermediate     GENTAMICIN <=0.5 SENSITIVE Sensitive     OXACILLIN <=0.25 SENSITIVE Sensitive     TETRACYCLINE <=1 SENSITIVE Sensitive     VANCOMYCIN 1 SENSITIVE Sensitive     TRIMETH/SULFA <=10 SENSITIVE Sensitive     RIFAMPIN <=0.5 SENSITIVE Sensitive     Inducible Clindamycin NEGATIVE Sensitive     * HEAVY GROWTH STAPHYLOCOCCUS AUREUS  Culture, blood (Routine X 2) w Reflex to ID Panel     Status: None   Collection Time:  12/07/15 12:09 AM  Result Value Ref Range Status   Specimen Description BLOOD BLOOD RIGHT HAND  Final   Special Requests BOTTLES DRAWN AEROBIC AND ANAEROBIC 10CC  Final   Culture NO GROWTH 6 DAYS  Final   Report Status 12/13/2015 FINAL  Final  Culture, blood (Routine X 2) w Reflex to ID Panel     Status: None   Collection Time: 12/07/15 12:09 AM  Result Value Ref Range Status   Specimen Description BLOOD BLOOD LEFT HAND  Final   Special Requests BOTTLES DRAWN AEROBIC AND ANAEROBIC 10CC  Final   Culture NO GROWTH 6 DAYS  Final   Report Status 12/13/2015 FINAL  Final  Urine culture     Status: None   Collection Time: 12/07/15 12:11 AM  Result Value Ref Range Status   Specimen Description URINE, CLEAN CATCH  Final   Special Requests NONE  Final   Culture NO GROWTH 1 DAY  Final   Report Status 12/08/2015 FINAL  Final  MRSA PCR Screening     Status: None   Collection Time: 12/08/15  9:12 AM  Result Value Ref Range Status   MRSA by PCR NEGATIVE NEGATIVE Final    Comment:        The GeneXpert MRSA Assay (FDA approved for NASAL specimens only), is one component of a comprehensive MRSA colonization surveillance program. It is not intended to diagnose MRSA infection nor to guide or monitor treatment for MRSA infections.   C difficile quick scan w PCR reflex     Status: None   Collection Time: 12/11/15  1:54 PM  Result Value Ref Range Status   C Diff antigen NEGATIVE NEGATIVE Final   C Diff toxin NEGATIVE NEGATIVE Final   C Diff interpretation Negative for C. difficile  Final  Culture, blood (Routine X 2) w Reflex to ID Panel     Status: None   Collection Time: 12/13/15  9:33 AM  Result Value Ref Range Status   Specimen Description BLOOD LEFT ANTECUBITAL  Final   Special Requests BOTTLES DRAWN AEROBIC AND ANAEROBIC  4CC  Final   Culture NO GROWTH 5  DAYS  Final   Report Status 12/18/2015 FINAL  Final  Culture, blood (Routine X 2) w Reflex to ID Panel     Status: None   Collection  Time: 12/13/15  9:41 AM  Result Value Ref Range Status   Specimen Description BLOOD RIGHT ARM  Final   Special Requests BOTTLES DRAWN AEROBIC AND ANAEROBIC  2CC  Final   Culture NO GROWTH 5 DAYS  Final   Report Status 12/18/2015 FINAL  Final  Urine culture     Status: None   Collection Time: 12/13/15 11:01 AM  Result Value Ref Range Status   Specimen Description URINE, CATHETERIZED  Final   Special Requests NONE  Final   Culture NO GROWTH 1 DAY  Final   Report Status 12/14/2015 FINAL  Final  Culture, expectorated sputum-assessment     Status: None   Collection Time: 12/13/15 11:30 AM  Result Value Ref Range Status   Specimen Description TRACHEAL ASPIRATE  Final   Special Requests NONE  Final   Sputum evaluation THIS SPECIMEN IS ACCEPTABLE FOR SPUTUM CULTURE  Final   Report Status 12/13/2015 FINAL  Final  Culture, respiratory (NON-Expectorated)     Status: None   Collection Time: 12/13/15 11:30 AM  Result Value Ref Range Status   Specimen Description TRACHEAL ASPIRATE  Final   Special Requests NONE Reflexed from [id number]  Final   Gram Stain   Final    FEW WBC SEEN FEW GRAM POSITIVE COCCI FEW YEAST FAIR SPECIMEN - 70-80% WBCS    Culture LIGHT GROWTH CANDIDA ALBICANS  Final   Report Status 12/17/2015 FINAL  Final  Cath Tip Culture     Status: None   Collection Time: 12/14/15 12:47 PM  Result Value Ref Range Status   Specimen Description CATH TIP  Final   Special Requests NONE  Final   Culture NO GROWTH 3 DAYS  Final   Report Status 12/17/2015 FINAL  Final  Urine culture     Status: None   Collection Time: 12/16/15  8:19 AM  Result Value Ref Range Status   Specimen Description URINE, RANDOM  Final   Special Requests Normal  Final   Culture NO GROWTH 1 DAY  Final   Report Status 12/17/2015 FINAL  Final  CULTURE, BLOOD (ROUTINE X 2) w Reflex to PCR ID Panel     Status: None (Preliminary result)   Collection Time: 12/16/15  8:28 AM  Result Value Ref Range Status   Specimen  Description BLOOD LEFT ARM  Final   Special Requests BOTTLES DRAWN AEROBIC AND ANAEROBIC 10ML  Final   Culture NO GROWTH 3 DAYS  Final   Report Status PENDING  Incomplete  CULTURE, BLOOD (ROUTINE X 2) w Reflex to PCR ID Panel     Status: None (Preliminary result)   Collection Time: 12/16/15  8:45 AM  Result Value Ref Range Status   Specimen Description BLOOD RIGHT ARM  Final   Special Requests BOTTLES DRAWN AEROBIC AND ANAEROBIC 10ML  Final   Culture NO GROWTH 3 DAYS  Final   Report Status PENDING  Incomplete  C difficile quick scan w PCR reflex     Status: None   Collection Time: 12/16/15  4:26 PM  Result Value Ref Range Status   C Diff antigen NEGATIVE NEGATIVE Final   C Diff toxin NEGATIVE NEGATIVE Final   C Diff interpretation Negative for C. difficile  Final  Culture, expectorated sputum-assessment     Status: None   Collection Time:  12/16/15  5:32 PM  Result Value Ref Range Status   Specimen Description SPUTUM  Final   Special Requests NONE  Final   Sputum evaluation THIS SPECIMEN IS ACCEPTABLE FOR SPUTUM CULTURE  Final   Report Status 12/16/2015 FINAL  Final  Culture, respiratory (NON-Expectorated)     Status: None (Preliminary result)   Collection Time: 12/16/15  5:32 PM  Result Value Ref Range Status   Specimen Description SPUTUM  Final   Special Requests NONE Reflexed from [id number]  Final   Gram Stain   Final    MANY WBC SEEN RARE SQUAMOUS EPITHELIAL CELLS PRESENT FEW YEAST EXCELLENT SPECIMEN - 90-100% WBCS    Culture HOLDING FOR POSSIBLE PATHOGEN  Final   Report Status PENDING  Incomplete  MRSA PCR Screening     Status: None   Collection Time: 12/17/15 11:50 AM  Result Value Ref Range Status   MRSA by PCR NEGATIVE NEGATIVE Final    Comment:        The GeneXpert MRSA Assay (FDA approved for NASAL specimens only), is one component of a comprehensive MRSA colonization surveillance program. It is not intended to diagnose MRSA infection nor to guide or monitor  treatment for MRSA infections.   Culture, respiratory (NON-Expectorated)     Status: None (Preliminary result)   Collection Time: 12/18/15 12:15 AM  Result Value Ref Range Status   Specimen Description TRACHEAL ASPIRATE  Final   Special Requests NONE  Final   Gram Stain PENDING  Incomplete   Culture   Final    LIGHT GROWTH YEAST IDENTIFICATION TO FOLLOW ONCE ISOLATED    Report Status PENDING  Incomplete  Urine culture     Status: None   Collection Time: 12/18/15 12:24 AM  Result Value Ref Range Status   Specimen Description URINE, RANDOM  Final   Special Requests NONE  Final   Culture NO GROWTH 1 DAY  Final   Report Status 12/19/2015 FINAL  Final  Culture, blood (Routine X 2) w Reflex to ID Panel     Status: None (Preliminary result)   Collection Time: 12/18/15  4:29 AM  Result Value Ref Range Status   Specimen Description BLOOD RIGHT HAND  Final   Special Requests BOTTLES DRAWN AEROBIC AND ANAEROBIC 5ML  Final   Culture NO GROWTH 1 DAY  Final   Report Status PENDING  Incomplete  Culture, blood (Routine X 2) w Reflex to ID Panel     Status: None (Preliminary result)   Collection Time: 12/18/15  4:37 AM  Result Value Ref Range Status   Specimen Description BLOOD LEFT ARM  Final   Special Requests BOTTLES DRAWN AEROBIC AND ANAEROBIC 10ML  Final   Culture NO GROWTH 1 DAY  Final   Report Status PENDING  Incomplete    Coagulation Studies: No results for input(s): LABPROT, INR in the last 72 hours.  Urinalysis:  Recent Labs  12/18/15 0024  COLORURINE YELLOW*  LABSPEC 1.014  PHURINE 8.0  GLUCOSEU 150*  HGBUR 2+*  BILIRUBINUR NEGATIVE  KETONESUR NEGATIVE  PROTEINUR 100*  NITRITE NEGATIVE  LEUKOCYTESUR NEGATIVE      Imaging: Dg Chest 1 View  12/19/2015  CLINICAL DATA:  Shortness of breath. EXAM: CHEST 1 VIEW COMPARISON:  12/18/2015 . FINDINGS: Endotracheal tube, NG tube, right IJ line in stable position. Prior CABG. Cardiomegaly. Continued partial clearing of  bilateral pulmonary infiltrates consistent with partial clearing of pulmonary edema. No pleural effusion or pneumothorax . Surgical sutures noted over the upper lungs.  No acute bony abnormality. IMPRESSION: 1. Lines and tubes in stable position. 2. Prior CABG. Cardiomegaly with continued partial clearing of bilateral pulmonary infiltrates consistent with partial clearing of pulmonary edema edema . Electronically Signed   By: Corona   On: 12/19/2015 07:13   Dg Chest 1 View  12/18/2015  CLINICAL DATA:  Shortness of breath. EXAM: CHEST 1 VIEW COMPARISON:  12/17/2015. FINDINGS: Endotracheal tube, right IJ line, NG tube in stable position. Prior CABG. Persistent but improving bilateral pulmonary infiltrates consistent with improving congestive heart failure. Bilateral pneumonia cannot be excluded. Small left pleural effusion. No pneumothorax. IMPRESSION: 1. Lines and tubes in stable position. 2. Prior CABG. Stable cardiomegaly. Persistent but slightly improving bilateral pulmonary infiltrates and left pleural effusion. Findings consistent with improving congestive heart failure. Electronically Signed   By: Marcello Moores  Register   On: 12/18/2015 07:05   Ct Head Wo Contrast  12/17/2015  CLINICAL DATA:  Recent nonhemorrhagic infarct involving the right PCA territory and bilateral cerebellum, left greater than right. EXAM: CT HEAD WITHOUT CONTRAST TECHNIQUE: Contiguous axial images were obtained from the base of the skull through the vertex without intravenous contrast. COMPARISON:  12/07/2015 FINDINGS: Interval evolution of bilateral cerebellar infarcts now shows the right cerebellar infarct to the larger than that on the left. No evidence for interval progression on either side. No evidence for hemorrhagic transformation. The cytotoxic edema is seen in the right PCA territory on the previous study shows continued evolution with more noticeable low-density in this territory today. No evidence for associated  hemorrhage. No evidence for new acute infarct. No evidence for hydrocephalus or midline shift. Diffuse loss of parenchymal volume is consistent with atrophy. Patchy low attenuation in the deep hemispheric and periventricular white matter is nonspecific, but likely reflects chronic microvascular ischemic demyelination. The visualized paranasal sinuses and mastoid air cells are clear. IMPRESSION: 1. 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 2. No new or progressive findings. 3. Atrophy with chronic small vessel white matter ischemic disease. Electronically Signed   By: Misty Stanley M.D.   On: 12/17/2015 18:42     Medications:   . 0.45 % NaCl with KCl 20 mEq / L 100 mL/hr at 12/18/15 2208  . feeding supplement (VITAL HIGH PROTEIN) 1,000 mL (12/19/15 0050)  . nitroGLYCERIN    . propofol (DIPRIVAN) infusion 40 mcg/kg/min (12/19/15 1307)   . amiodarone  150 mg Intravenous Once  . amiodarone  200 mg Oral BID  . antiseptic oral rinse  7 mL Mouth Rinse QID  . aspirin  162 mg Oral Daily  . cefTAZidime (FORTAZ)  IV  1 g Intravenous Once  . cefTAZidime (FORTAZ)  IV  1 g Intravenous Q8H  . chlorhexidine gluconate  15 mL Mouth Rinse BID  . feeding supplement (PRO-STAT SUGAR FREE 64)  30 mL Per Tube Daily  . free water  200 mL Per Tube Q4H  . heparin subcutaneous  5,000 Units Subcutaneous Q12H  . insulin aspart  0-20 Units Subcutaneous 6 times per day  . insulin glargine  18 Units Subcutaneous Daily  . metoprolol  2.5 mg Intravenous 4 times per day  . metronidazole  500 mg Intravenous Q8H  . pantoprazole sodium  40 mg Per Tube Daily  . sodium chloride  500 mL Intravenous Once   acetaminophen, acetaminophen **OR** [DISCONTINUED] acetaminophen, bisacodyl, fentaNYL (SUBLIMAZE) injection, hydrALAZINE, ibuprofen, ipratropium-albuterol, labetalol, morphine injection, naLOXone (NARCAN)  injection, ondansetron **OR** ondansetron (ZOFRAN) IV, ondansetron (ZOFRAN) IV,  polyethylene glycol  Assessment/ Plan:  Mr. Stephen Camacho is a 70 y.o. black  male with hypertension, hyperlipidemia, diabetes mellitus type II, coronary artery disease status post 2 vessel CABG, peripheral arterial disease status post right above-the-knee amputation, seizure disorder, lung cancer, prostate cancer, history of CVA, who was [facility] 12/05/2015 for ischemic left lower extremity. Hospital course complicated by acute renal failure, acute respiratory failure, new CVAs, myocardial infarction.  1. Acute renal failure:  ATN from contrast exposure. nonoliguric urine output.  Baseline creatinine of 0.83.  - Cr had stabilized at 1.74, but started to worsen again since 2/18  - ? Due to infection (WBC count worse, fever), likely aspiration - vascath removed - currently no acute indication of HD - S Cr further improved    2. Hypernatremia, Na high at 151->148 - currently IV fluids are half normal saline + KCl - free water replacement via OGT 200 q4hr  3. Ischemic left lower extremity: peripheral vascular disease: status post Left AKA on 2/17 Dr. Delana Meyer   4. Acute respiratory failure: Status post intubation and on mechanical ventilation. With acute myocardial infarction and acute CVA.  - probably combination of acute pulm edema and aspiration pneumonia - re-intubated 2/21  5. Anemia with renal failure: PRBC transfusion 2/14 and 2/17.    6. Hypokalemia - replace iv    LOS: 14 Stephen Camacho 2/24/20172:59 PM

## 2015-12-19 NOTE — Progress Notes (Signed)
[place] NOTE  Pharmacy Consult for electrolyte replacement     No Known Allergies  Patient Measurements: Height: '6\' 4"'$  (193 cm) Weight:  (Unable to weigh pt. Bed scale broken. [employer] notified) IBW/kg (Calculated) : 86.8   Vital Signs: Temp: 99.7 F (37.6 C) (02/24 0700) Temp Source: Oral (02/24 0400) BP: 120/64 mmHg (02/24 0700) Pulse Rate: 77 (02/24 0700) Intake/Output from previous day: 02/23 0701 - 02/24 0700 In: 4186.5 [I.V.:2356.5; NG/GT:1480; IV Piggyback:350] Out: 7353 [Urine:3480; Stool:875] Intake/Output from this shift:   Vent settings for last 24 hours: Vent Mode:  [-] PRVC FiO2 (%):  [35 %] 35 % Set Rate:  [15 bmp] 15 bmp Vt Set:  [500 mL] 500 mL PEEP:  [5 cmH20] 5 cmH20 Plateau Pressure:  [16 cmH20] 16 cmH20  Labs:  Recent Labs  12/17/15 0421 12/18/15 0526 12/18/15 1310 12/19/15 0600  WBC 36.9*  --   --  28.9*  HGB 8.4*  --   --  7.8*  HCT 26.7*  --   --  24.9*  PLT 190  --   --  195  CREATININE 2.40* 1.91*  --  1.35*  MG 2.0 2.0 1.9  --   PHOS 3.3 2.8  --   --    Estimated Creatinine Clearance: 51.9 mL/min (by C-G formula based on Cr of 1.35).   Recent Labs  12/19/15 0023 12/19/15 0400 12/19/15 0743  GLUCAP 215* 159* 131*    Microbiology: Recent Results (from the past 720 hour(s))  Culture, respiratory (NON-Expectorated)     Status: None   Collection Time: 12/06/15  4:05 PM  Result Value Ref Range Status   Specimen Description NASAL SWAB  Final   Special Requests NONE  Final   Gram Stain RARE WBC SEEN FEW GRAM POSITIVE COCCI   Final   Culture HEAVY GROWTH STAPHYLOCOCCUS AUREUS  Final   Report Status 12/09/2015 FINAL  Final   Organism ID, Bacteria STAPHYLOCOCCUS AUREUS  Final      Susceptibility   Staphylococcus aureus - MIC*    CIPROFLOXACIN 2 INTERMEDIATE Intermediate     GENTAMICIN <=0.5 SENSITIVE Sensitive     OXACILLIN <=0.25 SENSITIVE Sensitive     TETRACYCLINE <=1 SENSITIVE Sensitive    VANCOMYCIN 1 SENSITIVE Sensitive     TRIMETH/SULFA <=10 SENSITIVE Sensitive     RIFAMPIN <=0.5 SENSITIVE Sensitive     Inducible Clindamycin NEGATIVE Sensitive     * HEAVY GROWTH STAPHYLOCOCCUS AUREUS  Culture, blood (Routine X 2) w Reflex to ID Panel     Status: None   Collection Time: 12/07/15 12:09 AM  Result Value Ref Range Status   Specimen Description BLOOD BLOOD RIGHT HAND  Final   Special Requests BOTTLES DRAWN AEROBIC AND ANAEROBIC 10CC  Final   Culture NO GROWTH 6 DAYS  Final   Report Status 12/13/2015 FINAL  Final  Culture, blood (Routine X 2) w Reflex to ID Panel     Status: None   Collection Time: 12/07/15 12:09 AM  Result Value Ref Range Status   Specimen Description BLOOD BLOOD LEFT HAND  Final   Special Requests BOTTLES DRAWN AEROBIC AND ANAEROBIC 10CC  Final   Culture NO GROWTH 6 DAYS  Final   Report Status 12/13/2015 FINAL  Final  Urine culture     Status: None   Collection Time: 12/07/15 12:11 AM  Result Value Ref Range Status   Specimen Description URINE, CLEAN CATCH  Final   Special Requests NONE  Final  Culture NO GROWTH 1 DAY  Final   Report Status 12/08/2015 FINAL  Final  MRSA PCR Screening     Status: None   Collection Time: 12/08/15  9:12 AM  Result Value Ref Range Status   MRSA by PCR NEGATIVE NEGATIVE Final    Comment:        The GeneXpert MRSA Assay (FDA approved for NASAL specimens only), is one component of a comprehensive MRSA colonization surveillance program. It is not intended to diagnose MRSA infection nor to guide or monitor treatment for MRSA infections.   C difficile quick scan w PCR reflex     Status: None   Collection Time: 12/11/15  1:54 PM  Result Value Ref Range Status   C Diff antigen NEGATIVE NEGATIVE Final   C Diff toxin NEGATIVE NEGATIVE Final   C Diff interpretation Negative for C. difficile  Final  Culture, blood (Routine X 2) w Reflex to ID Panel     Status: None   Collection Time: 12/13/15  9:33 AM  Result Value Ref  Range Status   Specimen Description BLOOD LEFT ANTECUBITAL  Final   Special Requests BOTTLES DRAWN AEROBIC AND ANAEROBIC  4CC  Final   Culture NO GROWTH 5 DAYS  Final   Report Status 12/18/2015 FINAL  Final  Culture, blood (Routine X 2) w Reflex to ID Panel     Status: None   Collection Time: 12/13/15  9:41 AM  Result Value Ref Range Status   Specimen Description BLOOD RIGHT ARM  Final   Special Requests BOTTLES DRAWN AEROBIC AND ANAEROBIC  2CC  Final   Culture NO GROWTH 5 DAYS  Final   Report Status 12/18/2015 FINAL  Final  Urine culture     Status: None   Collection Time: 12/13/15 11:01 AM  Result Value Ref Range Status   Specimen Description URINE, CATHETERIZED  Final   Special Requests NONE  Final   Culture NO GROWTH 1 DAY  Final   Report Status 12/14/2015 FINAL  Final  Culture, expectorated sputum-assessment     Status: None   Collection Time: 12/13/15 11:30 AM  Result Value Ref Range Status   Specimen Description TRACHEAL ASPIRATE  Final   Special Requests NONE  Final   Sputum evaluation THIS SPECIMEN IS ACCEPTABLE FOR SPUTUM CULTURE  Final   Report Status 12/13/2015 FINAL  Final  Culture, respiratory (NON-Expectorated)     Status: None   Collection Time: 12/13/15 11:30 AM  Result Value Ref Range Status   Specimen Description TRACHEAL ASPIRATE  Final   Special Requests NONE Reflexed from [id number]  Final   Gram Stain   Final    FEW WBC SEEN FEW GRAM POSITIVE COCCI FEW YEAST FAIR SPECIMEN - 70-80% WBCS    Culture LIGHT GROWTH CANDIDA ALBICANS  Final   Report Status 12/17/2015 FINAL  Final  Cath Tip Culture     Status: None   Collection Time: 12/14/15 12:47 PM  Result Value Ref Range Status   Specimen Description CATH TIP  Final   Special Requests NONE  Final   Culture NO GROWTH 3 DAYS  Final   Report Status 12/17/2015 FINAL  Final  Urine culture     Status: None   Collection Time: 12/16/15  8:19 AM  Result Value Ref Range Status   Specimen Description URINE, RANDOM   Final   Special Requests Normal  Final   Culture NO GROWTH 1 DAY  Final   Report Status 12/17/2015 FINAL  Final  CULTURE, BLOOD (ROUTINE X 2) w Reflex to PCR ID Panel     Status: None (Preliminary result)   Collection Time: 12/16/15  8:28 AM  Result Value Ref Range Status   Specimen Description BLOOD LEFT ARM  Final   Special Requests BOTTLES DRAWN AEROBIC AND ANAEROBIC 10ML  Final   Culture NO GROWTH 3 DAYS  Final   Report Status PENDING  Incomplete  CULTURE, BLOOD (ROUTINE X 2) w Reflex to PCR ID Panel     Status: None (Preliminary result)   Collection Time: 12/16/15  8:45 AM  Result Value Ref Range Status   Specimen Description BLOOD RIGHT ARM  Final   Special Requests BOTTLES DRAWN AEROBIC AND ANAEROBIC 10ML  Final   Culture NO GROWTH 3 DAYS  Final   Report Status PENDING  Incomplete  C difficile quick scan w PCR reflex     Status: None   Collection Time: 12/16/15  4:26 PM  Result Value Ref Range Status   C Diff antigen NEGATIVE NEGATIVE Final   C Diff toxin NEGATIVE NEGATIVE Final   C Diff interpretation Negative for C. difficile  Final  Culture, expectorated sputum-assessment     Status: None   Collection Time: 12/16/15  5:32 PM  Result Value Ref Range Status   Specimen Description SPUTUM  Final   Special Requests NONE  Final   Sputum evaluation THIS SPECIMEN IS ACCEPTABLE FOR SPUTUM CULTURE  Final   Report Status 12/16/2015 FINAL  Final  Culture, respiratory (NON-Expectorated)     Status: None (Preliminary result)   Collection Time: 12/16/15  5:32 PM  Result Value Ref Range Status   Specimen Description SPUTUM  Final   Special Requests NONE Reflexed from [id number]  Final   Gram Stain   Final    MANY WBC SEEN RARE SQUAMOUS EPITHELIAL CELLS PRESENT FEW YEAST EXCELLENT SPECIMEN - 90-100% WBCS    Culture HOLDING FOR POSSIBLE PATHOGEN  Final   Report Status PENDING  Incomplete  MRSA PCR Screening     Status: None   Collection Time: 12/17/15 11:50 AM  Result Value Ref  Range Status   MRSA by PCR NEGATIVE NEGATIVE Final    Comment:        The GeneXpert MRSA Assay (FDA approved for NASAL specimens only), is one component of a comprehensive MRSA colonization surveillance program. It is not intended to diagnose MRSA infection nor to guide or monitor treatment for MRSA infections.   Culture, blood (Routine X 2) w Reflex to ID Panel     Status: None (Preliminary result)   Collection Time: 12/18/15  4:29 AM  Result Value Ref Range Status   Specimen Description BLOOD RIGHT HAND  Final   Special Requests BOTTLES DRAWN AEROBIC AND ANAEROBIC 5ML  Final   Culture NO GROWTH 1 DAY  Final   Report Status PENDING  Incomplete  Culture, blood (Routine X 2) w Reflex to ID Panel     Status: None (Preliminary result)   Collection Time: 12/18/15  4:37 AM  Result Value Ref Range Status   Specimen Description BLOOD LEFT ARM  Final   Special Requests BOTTLES DRAWN AEROBIC AND ANAEROBIC 10ML  Final   Culture NO GROWTH 1 DAY  Final   Report Status PENDING  Incomplete    Medications:  Scheduled:  . amiodarone  150 mg Intravenous Once  . amiodarone  200 mg Oral BID  . antiseptic oral rinse  7 mL Mouth Rinse QID  . aspirin  162 mg  Oral Daily  . cefTAZidime (FORTAZ)  IV  1 g Intravenous Once  . cefTAZidime (FORTAZ)  IV  1 g Intravenous Q12H  . chlorhexidine gluconate  15 mL Mouth Rinse BID  . feeding supplement (PRO-STAT SUGAR FREE 64)  30 mL Per Tube Daily  . free water  200 mL Per Tube Q4H  . heparin subcutaneous  5,000 Units Subcutaneous Q12H  . insulin aspart  0-20 Units Subcutaneous 6 times per day  . insulin glargine  18 Units Subcutaneous Daily  . metoprolol  2.5 mg Intravenous 4 times per day  . metronidazole  500 mg Intravenous Q8H  . pantoprazole sodium  40 mg Per Tube Daily  . sodium chloride  500 mL Intravenous Once   Infusions:  . 0.45 % NaCl with KCl 20 mEq / L 100 mL/hr at 12/18/15 2208  . feeding supplement (VITAL HIGH PROTEIN) 1,000 mL (12/19/15  0050)  . nitroGLYCERIN    . propofol (DIPRIVAN) infusion 30 mcg/kg/min (12/19/15 1660)    Assessment: Pharmacy consulted for electrolyte management for 71 yo male ICU patient requiring mechanical ventilation.     Plan:  Potassium is low at 3.1. Will replace with KCl 40 meq oral x 1 and recheck potassium at 1800.   Pharmacy will continue to monitor and adjust per consult.    [name] 12/19/2015,11:03 AM

## 2015-12-19 NOTE — Progress Notes (Signed)
RT placed patient back on PRVC settings per Dr. Juanell Fairly.  Patient's family now wants to wait full 2 weeks from 1st intubation to make decision about plan.  Dr. Juanell Fairly suggests no wean trials this weekend.

## 2015-12-19 NOTE — Progress Notes (Signed)
[facility]  Daily Progress Note   Subjective: 7 Days Post-Op: Left above-the-knee amputation  Patient intubated. Sedated. No acute distress.   Objective: Filed Vitals:   12/19/15 0405 12/19/15 0500 12/19/15 0600 12/19/15 0700  BP:  124/67 111/56 120/64  Pulse:  83 79 77  Temp:    99.7 F (37.6 C)  TempSrc:      Resp:  '31 26 30  '$ Height:      SpO2: 100% 100% 100% 100%    Intake/Output Summary (Last 24 hours) at 12/19/15 1143 Last data filed at 12/19/15 0615  Gross per 24 hour  Intake 4186.53 ml  Output   3555 ml  Net 631.53 ml   Physical Exam: Sedated on vent. Unable to answer questions.  CV: RRR Pulmonary: Course Abdomen: Soft, Nontender, Nondistended Vascular: Left Lower Extremity: Staples clean , dry and intact. No drainage. Stump edematous   Laboratory: CBC    Component Value Date/Time   WBC 28.9* 12/19/2015 0600   WBC 4.9 10/25/2014 1331   HGB 7.8* 12/19/2015 0600   HGB 12.1* 10/25/2014 1331   HCT 24.9* 12/19/2015 0600   HCT 38.1* 10/25/2014 1331   PLT 195 12/19/2015 0600   PLT 151 10/25/2014 1331   BMET    Component Value Date/Time   NA 148* 12/19/2015 0600   NA 138 10/25/2014 1331   K 3.1* 12/19/2015 0600   K 3.9 10/25/2014 1331   CL 118* 12/19/2015 0600   CL 109* 10/25/2014 1331   CO2 24 12/19/2015 0600   CO2 21 10/25/2014 1331   GLUCOSE 166* 12/19/2015 0600   GLUCOSE 232* 10/25/2014 1331   BUN 35* 12/19/2015 0600   BUN 17 10/25/2014 1331   CREATININE 1.35* 12/19/2015 0600   CREATININE 1.11 10/25/2014 1331   CALCIUM 6.8* 12/19/2015 0600   CALCIUM 8.7 10/25/2014 1331   GFRNONAA 52* 12/19/2015 0600   GFRNONAA >60 10/25/2014 1331   GFRNONAA 52* 07/08/2014 1051   GFRAA 60* 12/19/2015 0600   GFRAA >60 10/25/2014 1331   GFRAA >60 07/08/2014 1051    Assessment/Planning: 70 year old male s/p 7 Days Post-Op: left above-the-knee amputation transferred back to ICU for respiratory distress, now intubated. 1) Stump  healing well 2) Consult for PEG placement 3) Consult for Trach placement 4) Care as per critical care team  [facility] PA-C 12/19/2015 11:43 AM

## 2015-12-19 NOTE — Care Management (Signed)
Family wishes to address goals of treatment on Tuesday- and whether to pursue trach and peg.  Patient has [employer] insurance and is not know to frequently approve ltac.   No weaning attempts will be made over the weekend.  If family wishes to pursue agressive treatment may benefit from ltac.  Have left voicemail message to son mack.

## 2015-12-19 NOTE — Progress Notes (Signed)
Spoke with Dr Nehemiah Massed patients heart rate in 120-170's. Per Dr Nehemiah Massed saw her at lunch. He will review her chart. At this point no additional orders.

## 2015-12-19 NOTE — Progress Notes (Signed)
[place] NOTE  Pharmacy Consult for amiodarone medication interactions Indication: Amiodarone infusion    No Known Allergies  Patient Measurements: Height: '6\' 4"'$  (193 cm) Weight:  (Unable to weigh pt. Bed scale broken. Biomed notified) IBW/kg (Calculated) : 86.8  Vital Signs: Temp: 99.7 F (37.6 C) (02/24 0700) Temp Source: Oral (02/24 0400) BP: 120/64 mmHg (02/24 0700) Pulse Rate: 77 (02/24 0700) Intake/Output from previous day: 02/23 0701 - 02/24 0700 In: 4186.5 [I.V.:2356.5; NG/GT:1480; IV Piggyback:350] Out: 5631 [Urine:3480; Stool:875] Intake/Output from this shift:   Vent settings for last 24 hours: Vent Mode:  [-] PRVC FiO2 (%):  [35 %] 35 % Set Rate:  [15 bmp] 15 bmp Vt Set:  [500 mL] 500 mL PEEP:  [5 cmH20] 5 cmH20 Plateau Pressure:  [16 cmH20] 16 cmH20  Labs:  Recent Labs  12/17/15 0421 12/18/15 0526 12/18/15 1310 12/19/15 0600  WBC 36.9*  --   --  28.9*  HGB 8.4*  --   --  7.8*  HCT 26.7*  --   --  24.9*  PLT 190  --   --  195  CREATININE 2.40* 1.91*  --  1.35*  MG 2.0 2.0 1.9  --   PHOS 3.3 2.8  --   --    Estimated Creatinine Clearance: 51.9 mL/min (by C-G formula based on Cr of 1.35).   Recent Labs  12/19/15 0023 12/19/15 0400 12/19/15 0743  GLUCAP 215* 159* 131*    Microbiology: Recent Results (from the past 720 hour(s))  Culture, respiratory (NON-Expectorated)     Status: None   Collection Time: 12/06/15  4:05 PM  Result Value Ref Range Status   Specimen Description NASAL SWAB  Final   Special Requests NONE  Final   Gram Stain RARE WBC SEEN FEW GRAM POSITIVE COCCI   Final   Culture HEAVY GROWTH STAPHYLOCOCCUS AUREUS  Final   Report Status 12/09/2015 FINAL  Final   Organism ID, Bacteria STAPHYLOCOCCUS AUREUS  Final      Susceptibility   Staphylococcus aureus - MIC*    CIPROFLOXACIN 2 INTERMEDIATE Intermediate     GENTAMICIN <=0.5 SENSITIVE Sensitive     OXACILLIN <=0.25 SENSITIVE Sensitive    TETRACYCLINE <=1 SENSITIVE Sensitive     VANCOMYCIN 1 SENSITIVE Sensitive     TRIMETH/SULFA <=10 SENSITIVE Sensitive     RIFAMPIN <=0.5 SENSITIVE Sensitive     Inducible Clindamycin NEGATIVE Sensitive     * HEAVY GROWTH STAPHYLOCOCCUS AUREUS  Culture, blood (Routine X 2) w Reflex to ID Panel     Status: None   Collection Time: 12/07/15 12:09 AM  Result Value Ref Range Status   Specimen Description BLOOD BLOOD RIGHT HAND  Final   Special Requests BOTTLES DRAWN AEROBIC AND ANAEROBIC 10CC  Final   Culture NO GROWTH 6 DAYS  Final   Report Status 12/13/2015 FINAL  Final  Culture, blood (Routine X 2) w Reflex to ID Panel     Status: None   Collection Time: 12/07/15 12:09 AM  Result Value Ref Range Status   Specimen Description BLOOD BLOOD LEFT HAND  Final   Special Requests BOTTLES DRAWN AEROBIC AND ANAEROBIC 10CC  Final   Culture NO GROWTH 6 DAYS  Final   Report Status 12/13/2015 FINAL  Final  Urine culture     Status: None   Collection Time: 12/07/15 12:11 AM  Result Value Ref Range Status   Specimen Description URINE, CLEAN CATCH  Final   Special Requests NONE  Final  Culture NO GROWTH 1 DAY  Final   Report Status 12/08/2015 FINAL  Final  MRSA PCR Screening     Status: None   Collection Time: 12/08/15  9:12 AM  Result Value Ref Range Status   MRSA by PCR NEGATIVE NEGATIVE Final    Comment:        The GeneXpert MRSA Assay (FDA approved for NASAL specimens only), is one component of a comprehensive MRSA colonization surveillance program. It is not intended to diagnose MRSA infection nor to guide or monitor treatment for MRSA infections.   C difficile quick scan w PCR reflex     Status: None   Collection Time: 12/11/15  1:54 PM  Result Value Ref Range Status   C Diff antigen NEGATIVE NEGATIVE Final   C Diff toxin NEGATIVE NEGATIVE Final   C Diff interpretation Negative for C. difficile  Final  Culture, blood (Routine X 2) w Reflex to ID Panel     Status: None   Collection  Time: 12/13/15  9:33 AM  Result Value Ref Range Status   Specimen Description BLOOD LEFT ANTECUBITAL  Final   Special Requests BOTTLES DRAWN AEROBIC AND ANAEROBIC  4CC  Final   Culture NO GROWTH 5 DAYS  Final   Report Status 12/18/2015 FINAL  Final  Culture, blood (Routine X 2) w Reflex to ID Panel     Status: None   Collection Time: 12/13/15  9:41 AM  Result Value Ref Range Status   Specimen Description BLOOD RIGHT ARM  Final   Special Requests BOTTLES DRAWN AEROBIC AND ANAEROBIC  2CC  Final   Culture NO GROWTH 5 DAYS  Final   Report Status 12/18/2015 FINAL  Final  Urine culture     Status: None   Collection Time: 12/13/15 11:01 AM  Result Value Ref Range Status   Specimen Description URINE, CATHETERIZED  Final   Special Requests NONE  Final   Culture NO GROWTH 1 DAY  Final   Report Status 12/14/2015 FINAL  Final  Culture, expectorated sputum-assessment     Status: None   Collection Time: 12/13/15 11:30 AM  Result Value Ref Range Status   Specimen Description TRACHEAL ASPIRATE  Final   Special Requests NONE  Final   Sputum evaluation THIS SPECIMEN IS ACCEPTABLE FOR SPUTUM CULTURE  Final   Report Status 12/13/2015 FINAL  Final  Culture, respiratory (NON-Expectorated)     Status: None   Collection Time: 12/13/15 11:30 AM  Result Value Ref Range Status   Specimen Description TRACHEAL ASPIRATE  Final   Special Requests NONE Reflexed from [id number]  Final   Gram Stain   Final    FEW WBC SEEN FEW GRAM POSITIVE COCCI FEW YEAST FAIR SPECIMEN - 70-80% WBCS    Culture LIGHT GROWTH CANDIDA ALBICANS  Final   Report Status 12/17/2015 FINAL  Final  Cath Tip Culture     Status: None   Collection Time: 12/14/15 12:47 PM  Result Value Ref Range Status   Specimen Description CATH TIP  Final   Special Requests NONE  Final   Culture NO GROWTH 3 DAYS  Final   Report Status 12/17/2015 FINAL  Final  Urine culture     Status: None   Collection Time: 12/16/15  8:19 AM  Result Value Ref Range  Status   Specimen Description URINE, RANDOM  Final   Special Requests Normal  Final   Culture NO GROWTH 1 DAY  Final   Report Status 12/17/2015 FINAL  Final  CULTURE, BLOOD (ROUTINE X 2) w Reflex to PCR ID Panel     Status: None (Preliminary result)   Collection Time: 12/16/15  8:28 AM  Result Value Ref Range Status   Specimen Description BLOOD LEFT ARM  Final   Special Requests BOTTLES DRAWN AEROBIC AND ANAEROBIC 10ML  Final   Culture NO GROWTH 3 DAYS  Final   Report Status PENDING  Incomplete  CULTURE, BLOOD (ROUTINE X 2) w Reflex to PCR ID Panel     Status: None (Preliminary result)   Collection Time: 12/16/15  8:45 AM  Result Value Ref Range Status   Specimen Description BLOOD RIGHT ARM  Final   Special Requests BOTTLES DRAWN AEROBIC AND ANAEROBIC 10ML  Final   Culture NO GROWTH 3 DAYS  Final   Report Status PENDING  Incomplete  C difficile quick scan w PCR reflex     Status: None   Collection Time: 12/16/15  4:26 PM  Result Value Ref Range Status   C Diff antigen NEGATIVE NEGATIVE Final   C Diff toxin NEGATIVE NEGATIVE Final   C Diff interpretation Negative for C. difficile  Final  Culture, expectorated sputum-assessment     Status: None   Collection Time: 12/16/15  5:32 PM  Result Value Ref Range Status   Specimen Description SPUTUM  Final   Special Requests NONE  Final   Sputum evaluation THIS SPECIMEN IS ACCEPTABLE FOR SPUTUM CULTURE  Final   Report Status 12/16/2015 FINAL  Final  Culture, respiratory (NON-Expectorated)     Status: None (Preliminary result)   Collection Time: 12/16/15  5:32 PM  Result Value Ref Range Status   Specimen Description SPUTUM  Final   Special Requests NONE Reflexed from [id number]  Final   Gram Stain   Final    MANY WBC SEEN RARE SQUAMOUS EPITHELIAL CELLS PRESENT FEW YEAST EXCELLENT SPECIMEN - 90-100% WBCS    Culture HOLDING FOR POSSIBLE PATHOGEN  Final   Report Status PENDING  Incomplete  MRSA PCR Screening     Status: None   Collection  Time: 12/17/15 11:50 AM  Result Value Ref Range Status   MRSA by PCR NEGATIVE NEGATIVE Final    Comment:        The GeneXpert MRSA Assay (FDA approved for NASAL specimens only), is one component of a comprehensive MRSA colonization surveillance program. It is not intended to diagnose MRSA infection nor to guide or monitor treatment for MRSA infections.   Culture, blood (Routine X 2) w Reflex to ID Panel     Status: None (Preliminary result)   Collection Time: 12/18/15  4:29 AM  Result Value Ref Range Status   Specimen Description BLOOD RIGHT HAND  Final   Special Requests BOTTLES DRAWN AEROBIC AND ANAEROBIC 5ML  Final   Culture NO GROWTH 1 DAY  Final   Report Status PENDING  Incomplete  Culture, blood (Routine X 2) w Reflex to ID Panel     Status: None (Preliminary result)   Collection Time: 12/18/15  4:37 AM  Result Value Ref Range Status   Specimen Description BLOOD LEFT ARM  Final   Special Requests BOTTLES DRAWN AEROBIC AND ANAEROBIC 10ML  Final   Culture NO GROWTH 1 DAY  Final   Report Status PENDING  Incomplete    Medications:  Scheduled:  . amiodarone  150 mg Intravenous Once  . amiodarone  200 mg Oral BID  . antiseptic oral rinse  7 mL Mouth Rinse QID  . aspirin  162 mg  Oral Daily  . cefTAZidime (FORTAZ)  IV  1 g Intravenous Once  . cefTAZidime (FORTAZ)  IV  1 g Intravenous Q8H  . chlorhexidine gluconate  15 mL Mouth Rinse BID  . feeding supplement (PRO-STAT SUGAR FREE 64)  30 mL Per Tube Daily  . free water  200 mL Per Tube Q4H  . heparin subcutaneous  5,000 Units Subcutaneous Q12H  . insulin aspart  0-20 Units Subcutaneous 6 times per day  . insulin glargine  18 Units Subcutaneous Daily  . metoprolol  2.5 mg Intravenous 4 times per day  . metronidazole  500 mg Intravenous Q8H  . pantoprazole sodium  40 mg Per Tube Daily  . sodium chloride  500 mL Intravenous Once   Infusions:  . 0.45 % NaCl with KCl 20 mEq / L 100 mL/hr at 12/18/15 2208  . feeding  supplement (VITAL HIGH PROTEIN) 1,000 mL (12/19/15 0050)  . nitroGLYCERIN    . propofol (DIPRIVAN) infusion 30 mcg/kg/min (12/19/15 7371)    Assessment: Pharmacy consulted for amiodarone drug-drug interactions for 70 yo male on amiodarone infusion.   Plan:  Patient is on metronidazole which can increase the risk of QT prolongation with amiodarone. Would consider changing abx or checking an EKG.   Pharmacy will continue to monitor and adjust per consult.    Napoleon Form, RPh Clinical Pharmacist 12/19/2015,11:20 AM

## 2015-12-19 NOTE — Consult Note (Signed)
Stephen Camacho, Stephen Camacho [id number] 03/10/1946 Stephen Cabal, MD  Reason for Consult: respiratory failure  HPI: 70 y.o. Male with significant vascular disease and NSTEMI and CVA intubated with inability to wean from vent.  Concern for need for long term ventilation and was asked to evaluate for tracheostomy tube placement.  Allergies: No Known Allergies  ROS: Review of systems normal other than 12 systems except per HPI.  PMH:  Past Medical History  Diagnosis Date  . Hyperlipidemia   . Hypertension   . Diabetes mellitus ([place])   . CAD (coronary artery disease)     a. s/p 2 v CABG in 2012 (LIMA-LAD & SVG-OM); b. cath 07/2012 s/p PCI/DES to ostial LCx and OM  . PAD (peripheral artery disease) ([place])     a. s/p right SFA stent; b. s/p right toe amputation 2011; c. s/p right AKA spring 2016  . Seizures ([place])   . Gangrene of foot ([place])   . Wheezing   . Lung cancer ([place])   . Prostate cancer ([place])   . Stroke [facility])     FH:  Family History  Problem Relation Age of Onset  . Family history unknown: Yes    SH:  Social History   Social History  . Marital Status: Widowed    Spouse Name: N/A  . Number of Children: N/A  . Years of Education: N/A   Occupational History  . Not on file.   Social History Main Topics  . Smoking status: Never Smoker   . Smokeless tobacco: Not on file  . Alcohol Use: No  . Drug Use: Not on file  . Sexual Activity: Not on file   Other Topics Concern  . Not on file   Social History Narrative    PSH:  Past Surgical History  Procedure Laterality Date  . Right aka  07-10-2014  . Left femoral popliteal bypass    . Right lung lobeectomy    . Coronary artery bypass graft    . Peripheral vascular catheterization Left 12/06/2015    Procedure: Lower Extremity Angiography;  Surgeon: [name], MD;  Location: [place] [facility];  Service: Cardiovascular;  Laterality: Left;  . Peripheral vascular catheterization Left 12/06/2015    Procedure: Lower  Extremity Intervention;  Surgeon: [name], MD;  Location: [place] [facility];  Service: Cardiovascular;  Laterality: Left;  . Peripheral vascular catheterization N/A 12/05/2015    Procedure: Lower Extremity Angiography;  Surgeon: Stephen Cabal, MD;  Location: [place] [facility];  Service: Cardiovascular;  Laterality: N/A;  . Peripheral vascular catheterization  12/05/2015    Procedure: Lower Extremity Intervention;  Surgeon: Stephen Cabal, MD;  Location: [place];  Service: Cardiovascular;;  . Amputation Left 12/12/2015    Procedure: AMPUTATION ABOVE KNEE;  Surgeon: Stephen Cabal, MD;  Location: [facility];  Service: Vascular;  Laterality: Left;    Physical  Exam: GEN- Intubate and sedated EARS- external ears clear NOSE- clear anteriorly OC/OP- ETT in place and secure NECK- previous midline neck scar on sternum, good palpable landmarks of thyroid  A/P: Respiratory failure with need for long term ventilation  Plan-  Per care manager's note, family still not decided upon tracheostomy and will decide on Tuesday.  Will tentatively schedule for Thursday at earliest for tracheostomy tube placement.  If family decides to proceed, please obtain consent from next of kin.  Please also hold tube feeds as well as anticoagulation night prior to procedure.  Karlo Goeden 12/19/2015 5:15 PM

## 2015-12-19 NOTE — Progress Notes (Addendum)
Pharmacy Antibiotic Note  Denver Bentson is a 70 y.o. male Started on abx 12/16/15 for pneumonia.  Pharmacy has been consulted for Zosyn dosing.   Plan: Patient on amiodarone which has interaction with metronidazole. Per conversation with ID, will transition from ceftaz/metronidazole to Zosyn EI 3.375g IV Q8hr.   Pharmacy will continue to monitor and adjust per consult.    Height: '6\' 4"'$  (193 cm) Weight:  (Unable to weigh pt. Bed scale broken. Biomed notified) IBW/kg (Calculated) : 86.8  Temp (24hrs), Avg:99.6 F (37.6 C), Min:99.3 F (37.4 C), Max:99.7 F (37.6 C)   Recent Labs Lab 12/14/15 0213 12/15/15 0500 12/16/15 0440 12/16/15 0519 12/17/15 0421 12/18/15 0526 12/19/15 0600  WBC 17.8* 19.1* 28.5*  --  36.9*  --  28.9*  CREATININE 2.02* 2.37* 2.42*  --  2.40* 1.91* 1.35*  LATICACIDVEN  --   --   --  2.5*  --   --   --     Estimated Creatinine Clearance: 51.9 mL/min (by C-G formula based on Cr of 1.35).    No Known Allergies  Antimicrobials this admission: Vancomycin 2/21 >> 02/24 Ceftazidime 2/21 >>2/24 Metronidazole 2/23 >>2/24 Zosyn 2/24>>   Microbiology results: 2/18 BCx: NGTD 2/18 UCx: NGTD  2/18 Sputum: Yeast (light), no Gram stain 2/21 Sputum: holding for possible pathogen  MRSA PCR: (-)  Pharmacy will continue to monitor and adjust per consult.    [name] 12/19/2015 11:16 AM

## 2015-12-19 NOTE — Progress Notes (Addendum)
[place] NOTE  Pharmacy Consult for electrolyte replacement     No Known Allergies  Patient Measurements: Height: '6\' 4"'$  (193 cm) Weight:  (Unable to weigh pt. Bed scale broken. Biomed notified) IBW/kg (Calculated) : 86.8   Vital Signs: Temp: 100.5 F (38.1 C) (02/24 2000) Temp Source: Oral (02/24 2000) BP: 126/68 mmHg (02/24 2000) Pulse Rate: 79 (02/24 1800) Intake/Output from previous day: 02/23 0701 - 02/24 0700 In: 4186.5 [I.V.:2356.5; NG/GT:1480; IV Piggyback:350] Out: 9326 [Urine:3480; Stool:875] Intake/Output from this shift: Total I/O In: -  Out: 400 [Urine:400] Vent settings for last 24 hours: Vent Mode:  [-] PRVC FiO2 (%):  [35 %] 35 % Set Rate:  [15 bmp] 15 bmp Vt Set:  [500 mL] 500 mL PEEP:  [5 cmH20] 5 cmH20 Plateau Pressure:  [16 cmH20-17 cmH20] 17 cmH20  Labs:  Recent Labs  12/17/15 0421 12/18/15 0526 12/18/15 1310 12/19/15 0600  WBC 36.9*  --   --  28.9*  HGB 8.4*  --   --  7.8*  HCT 26.7*  --   --  24.9*  PLT 190  --   --  195  CREATININE 2.40* 1.91*  --  1.35*  MG 2.0 2.0 1.9  --   PHOS 3.3 2.8  --   --    Estimated Creatinine Clearance: 51.9 mL/min (by C-G formula based on Cr of 1.35).   Recent Labs  12/19/15 1207 12/19/15 1611 12/19/15 1940  GLUCAP 147* 147* 167*    Microbiology: Recent Results (from the past 720 hour(s))  Culture, respiratory (NON-Expectorated)     Status: None   Collection Time: 12/06/15  4:05 PM  Result Value Ref Range Status   Specimen Description NASAL SWAB  Final   Special Requests NONE  Final   Gram Stain RARE WBC SEEN FEW GRAM POSITIVE COCCI   Final   Culture HEAVY GROWTH STAPHYLOCOCCUS AUREUS  Final   Report Status 12/09/2015 FINAL  Final   Organism ID, Bacteria STAPHYLOCOCCUS AUREUS  Final      Susceptibility   Staphylococcus aureus - MIC*    CIPROFLOXACIN 2 INTERMEDIATE Intermediate     GENTAMICIN <=0.5 SENSITIVE Sensitive     OXACILLIN <=0.25 SENSITIVE Sensitive    TETRACYCLINE <=1 SENSITIVE Sensitive     VANCOMYCIN 1 SENSITIVE Sensitive     TRIMETH/SULFA <=10 SENSITIVE Sensitive     RIFAMPIN <=0.5 SENSITIVE Sensitive     Inducible Clindamycin NEGATIVE Sensitive     * HEAVY GROWTH STAPHYLOCOCCUS AUREUS  Culture, blood (Routine X 2) w Reflex to ID Panel     Status: None   Collection Time: 12/07/15 12:09 AM  Result Value Ref Range Status   Specimen Description BLOOD BLOOD RIGHT HAND  Final   Special Requests BOTTLES DRAWN AEROBIC AND ANAEROBIC 10CC  Final   Culture NO GROWTH 6 DAYS  Final   Report Status 12/13/2015 FINAL  Final  Culture, blood (Routine X 2) w Reflex to ID Panel     Status: None   Collection Time: 12/07/15 12:09 AM  Result Value Ref Range Status   Specimen Description BLOOD BLOOD LEFT HAND  Final   Special Requests BOTTLES DRAWN AEROBIC AND ANAEROBIC 10CC  Final   Culture NO GROWTH 6 DAYS  Final   Report Status 12/13/2015 FINAL  Final  Urine culture     Status: None   Collection Time: 12/07/15 12:11 AM  Result Value Ref Range Status   Specimen Description URINE, CLEAN CATCH  Final  Special Requests NONE  Final   Culture NO GROWTH 1 DAY  Final   Report Status 12/08/2015 FINAL  Final  MRSA PCR Screening     Status: None   Collection Time: 12/08/15  9:12 AM  Result Value Ref Range Status   MRSA by PCR NEGATIVE NEGATIVE Final    Comment:        The GeneXpert MRSA Assay (FDA approved for NASAL specimens only), is one component of a comprehensive MRSA colonization surveillance program. It is not intended to diagnose MRSA infection nor to guide or monitor treatment for MRSA infections.   C difficile quick scan w PCR reflex     Status: None   Collection Time: 12/11/15  1:54 PM  Result Value Ref Range Status   C Diff antigen NEGATIVE NEGATIVE Final   C Diff toxin NEGATIVE NEGATIVE Final   C Diff interpretation Negative for C. difficile  Final  Culture, blood (Routine X 2) w Reflex to ID Panel     Status: None   Collection  Time: 12/13/15  9:33 AM  Result Value Ref Range Status   Specimen Description BLOOD LEFT ANTECUBITAL  Final   Special Requests BOTTLES DRAWN AEROBIC AND ANAEROBIC  4CC  Final   Culture NO GROWTH 5 DAYS  Final   Report Status 12/18/2015 FINAL  Final  Culture, blood (Routine X 2) w Reflex to ID Panel     Status: None   Collection Time: 12/13/15  9:41 AM  Result Value Ref Range Status   Specimen Description BLOOD RIGHT ARM  Final   Special Requests BOTTLES DRAWN AEROBIC AND ANAEROBIC  2CC  Final   Culture NO GROWTH 5 DAYS  Final   Report Status 12/18/2015 FINAL  Final  Urine culture     Status: None   Collection Time: 12/13/15 11:01 AM  Result Value Ref Range Status   Specimen Description URINE, CATHETERIZED  Final   Special Requests NONE  Final   Culture NO GROWTH 1 DAY  Final   Report Status 12/14/2015 FINAL  Final  Culture, expectorated sputum-assessment     Status: None   Collection Time: 12/13/15 11:30 AM  Result Value Ref Range Status   Specimen Description TRACHEAL ASPIRATE  Final   Special Requests NONE  Final   Sputum evaluation THIS SPECIMEN IS ACCEPTABLE FOR SPUTUM CULTURE  Final   Report Status 12/13/2015 FINAL  Final  Culture, respiratory (NON-Expectorated)     Status: None   Collection Time: 12/13/15 11:30 AM  Result Value Ref Range Status   Specimen Description TRACHEAL ASPIRATE  Final   Special Requests NONE Reflexed from [id number]  Final   Gram Stain   Final    FEW WBC SEEN FEW GRAM POSITIVE COCCI FEW YEAST FAIR SPECIMEN - 70-80% WBCS    Culture LIGHT GROWTH CANDIDA ALBICANS  Final   Report Status 12/17/2015 FINAL  Final  Cath Tip Culture     Status: None   Collection Time: 12/14/15 12:47 PM  Result Value Ref Range Status   Specimen Description CATH TIP  Final   Special Requests NONE  Final   Culture NO GROWTH 3 DAYS  Final   Report Status 12/17/2015 FINAL  Final  Urine culture     Status: None   Collection Time: 12/16/15  8:19 AM  Result Value Ref Range  Status   Specimen Description URINE, RANDOM  Final   Special Requests Normal  Final   Culture NO GROWTH 1 DAY  Final  Report Status 12/17/2015 FINAL  Final  CULTURE, BLOOD (ROUTINE X 2) w Reflex to PCR ID Panel     Status: None (Preliminary result)   Collection Time: 12/16/15  8:28 AM  Result Value Ref Range Status   Specimen Description BLOOD LEFT ARM  Final   Special Requests BOTTLES DRAWN AEROBIC AND ANAEROBIC 10ML  Final   Culture NO GROWTH 3 DAYS  Final   Report Status PENDING  Incomplete  CULTURE, BLOOD (ROUTINE X 2) w Reflex to PCR ID Panel     Status: None (Preliminary result)   Collection Time: 12/16/15  8:45 AM  Result Value Ref Range Status   Specimen Description BLOOD RIGHT ARM  Final   Special Requests BOTTLES DRAWN AEROBIC AND ANAEROBIC 10ML  Final   Culture NO GROWTH 3 DAYS  Final   Report Status PENDING  Incomplete  C difficile quick scan w PCR reflex     Status: None   Collection Time: 12/16/15  4:26 PM  Result Value Ref Range Status   C Diff antigen NEGATIVE NEGATIVE Final   C Diff toxin NEGATIVE NEGATIVE Final   C Diff interpretation Negative for C. difficile  Final  Culture, expectorated sputum-assessment     Status: None   Collection Time: 12/16/15  5:32 PM  Result Value Ref Range Status   Specimen Description SPUTUM  Final   Special Requests NONE  Final   Sputum evaluation THIS SPECIMEN IS ACCEPTABLE FOR SPUTUM CULTURE  Final   Report Status 12/16/2015 FINAL  Final  Culture, respiratory (NON-Expectorated)     Status: None (Preliminary result)   Collection Time: 12/16/15  5:32 PM  Result Value Ref Range Status   Specimen Description SPUTUM  Final   Special Requests NONE Reflexed from [id number]  Final   Gram Stain   Final    MANY WBC SEEN RARE SQUAMOUS EPITHELIAL CELLS PRESENT FEW YEAST EXCELLENT SPECIMEN - 90-100% WBCS    Culture HOLDING FOR POSSIBLE PATHOGEN  Final   Report Status PENDING  Incomplete  MRSA PCR Screening     Status: None   Collection  Time: 12/17/15 11:50 AM  Result Value Ref Range Status   MRSA by PCR NEGATIVE NEGATIVE Final    Comment:        The GeneXpert MRSA Assay (FDA approved for NASAL specimens only), is one component of a comprehensive MRSA colonization surveillance program. It is not intended to diagnose MRSA infection nor to guide or monitor treatment for MRSA infections.   Culture, respiratory (NON-Expectorated)     Status: None (Preliminary result)   Collection Time: 12/18/15 12:15 AM  Result Value Ref Range Status   Specimen Description TRACHEAL ASPIRATE  Final   Special Requests NONE  Final   Gram Stain PENDING  Incomplete   Culture   Final    LIGHT GROWTH YEAST IDENTIFICATION TO FOLLOW ONCE ISOLATED    Report Status PENDING  Incomplete  Urine culture     Status: None   Collection Time: 12/18/15 12:24 AM  Result Value Ref Range Status   Specimen Description URINE, RANDOM  Final   Special Requests NONE  Final   Culture NO GROWTH 1 DAY  Final   Report Status 12/19/2015 FINAL  Final  Culture, blood (Routine X 2) w Reflex to ID Panel     Status: None (Preliminary result)   Collection Time: 12/18/15  4:29 AM  Result Value Ref Range Status   Specimen Description BLOOD RIGHT HAND  Final   Special Requests  BOTTLES DRAWN AEROBIC AND ANAEROBIC 5ML  Final   Culture NO GROWTH 1 DAY  Final   Report Status PENDING  Incomplete  Culture, blood (Routine X 2) w Reflex to ID Panel     Status: None (Preliminary result)   Collection Time: 12/18/15  4:37 AM  Result Value Ref Range Status   Specimen Description BLOOD LEFT ARM  Final   Special Requests BOTTLES DRAWN AEROBIC AND ANAEROBIC 10ML  Final   Culture NO GROWTH 1 DAY  Final   Report Status PENDING  Incomplete    Medications:  Scheduled:  . amiodarone  150 mg Intravenous Once  . amiodarone  200 mg Oral BID  . antiseptic oral rinse  7 mL Mouth Rinse QID  . aspirin  162 mg Oral Daily  . chlorhexidine gluconate  15 mL Mouth Rinse BID  . feeding  supplement (PRO-STAT SUGAR FREE 64)  30 mL Per Tube Daily  . free water  200 mL Per Tube Q4H  . heparin subcutaneous  5,000 Units Subcutaneous Q12H  . insulin aspart  0-20 Units Subcutaneous 6 times per day  . insulin glargine  18 Units Subcutaneous Daily  . metoprolol  2.5 mg Intravenous 4 times per day  . pantoprazole sodium  40 mg Per Tube Daily  . piperacillin-tazobactam (ZOSYN)  IV  3.375 g Intravenous 3 times per day  . sodium chloride  500 mL Intravenous Once   Infusions:  . 0.45 % NaCl with KCl 20 mEq / L 100 mL/hr at 12/19/15 2006  . feeding supplement (VITAL HIGH PROTEIN) 1,000 mL (12/19/15 0050)  . nitroGLYCERIN    . propofol (DIPRIVAN) infusion 40 mcg/kg/min (12/19/15 1816)    Assessment: Pharmacy consulted for electrolyte management for 70 yo male ICU patient requiring mechanical ventilation.     Plan:  Potassium is low at 3.1. Will replace with KCl 40 meq oral x 1 and recheck potassium at 1800.   2/24:  K @ 17:00 = 3.8 .  No further supplementation needed.  Will recheck K on 2/25 with AM labs.   Pharmacy will continue to monitor and adjust per consult.    Cregg Jutte D 12/19/2015,9:23 PM

## 2015-12-19 NOTE — Progress Notes (Signed)
Nutrition Follow-up    INTERVENTION:   EN: with current diprivan, recommend continuing current TF regimen. Continue to assess   NUTRITION DIAGNOSIS:   Inadequate oral intake related to inability to eat as evidenced by NPO status. Being addressed via TF  GOAL:   Patient will meet greater than or equal to 90% of their needs  MONITOR:    (Energy Intake, Electrolyte and renal Profile, Anthropometrics, Digestive System, Glucose Profile, Pulmonary Profile)  REASON FOR ASSESSMENT:   Ventilator, Consult Enteral/tube feeding initiation and management  ASSESSMENT:   Pt remains on vent, post op day 7 for left AKA; noted ENT and GI consult for trach and PEG placement  Diet Order:  Diet NPO time specified   EN: tolerating Vital High Protein at rate of 45 ml/hr, Prostat daily, 200 mL free water q 4 hours  Digestive System: rectal tube with 875 mL of stool in past 24 hours, no signs of TF intolerance   Recent Labs Lab 12/15/15 0500  12/17/15 0421  12/18/15 0526 12/18/15 0904 12/18/15 1310 12/19/15 0600  NA 147*  < > 154*  < > 153*  153* 151* 150* 148*  K 3.6  < > 2.6*  --  3.4*  --   --  3.1*  CL 115*  < > 116*  --  118*  --   --  118*  CO2 22  < > 27  --  28  --   --  24  BUN 50*  < > 52*  --  42*  --   --  35*  CREATININE 2.37*  < > 2.40*  --  1.91*  --   --  1.35*  CALCIUM 7.3*  < > 7.3*  --  7.0*  --   --  6.8*  MG 2.0  < > 2.0  --  2.0  --  1.9  --   PHOS 3.7  --  3.3  --  2.8  --   --   --   GLUCOSE 172*  < > 232*  --  267*  --   --  166*  < > = values in this interval not displayed.  Glucose Profile:  Recent Labs  12/19/15 0400 12/19/15 0743 12/19/15 1207  GLUCAP 159* 131* 147*   Meds: ss novolog, lantus, diprivan 448 kcals in past 24 hours  Height:   Ht Readings from Last 1 Encounters:  12/05/15 '6\' 4"'$  (1.93 m)    Weight:   Wt Readings from Last 1 Encounters:  12/02/15 176 lb (79.833 kg)    BMI:  Body mass index is 19.33 kg/(m^2).  Estimated  Nutritional Needs:   Kcal:  1711 kcals (Ve: 15.6, Tmax: 38.6) using wt of 72 kg, legnth of 44 inches  Protein:  108-144 g (1.5-2.0 g/kg)   Fluid:  1800-2160 mL (25-30 ml/kg)   HIGH Care Level  Kerman Passey MS, RD, LDN [phone] Pager  [phone] Weekend/On-Call Pager

## 2015-12-19 NOTE — Progress Notes (Signed)
Chaplain rounded the unit an provided a compassionate presence and support to the patient. Chaplain provided a silent prayer. Minerva Fester [phone]

## 2015-12-20 ENCOUNTER — Inpatient Hospital Stay: Payer: PPO

## 2015-12-20 DIAGNOSIS — I6340 Cerebral infarction due to embolism of unspecified cerebral artery: Secondary | ICD-10-CM

## 2015-12-20 DIAGNOSIS — T17908D Unspecified foreign body in respiratory tract, part unspecified causing other injury, subsequent encounter: Secondary | ICD-10-CM

## 2015-12-20 LAB — BASIC METABOLIC PANEL
ANION GAP: 5 (ref 5–15)
BUN: 32 mg/dL — AB (ref 6–20)
CHLORIDE: 113 mmol/L — AB (ref 101–111)
CO2: 22 mmol/L (ref 22–32)
Calcium: 6.8 mg/dL — ABNORMAL LOW (ref 8.9–10.3)
Creatinine, Ser: 1.32 mg/dL — ABNORMAL HIGH (ref 0.61–1.24)
GFR calc Af Amer: >60 mL/min (ref >60)
GFR, EST NON AFRICAN AMERICAN: 53 mL/min — AB (ref >60)
GLUCOSE: 186 mg/dL — AB (ref 65–99)
POTASSIUM: 3.8 mmol/L (ref 3.5–5.1)
SODIUM: 140 mmol/L (ref 135–145)

## 2015-12-20 LAB — CULTURE, RESPIRATORY W GRAM STAIN

## 2015-12-20 LAB — CBC
HCT: 25.8 % — ABNORMAL LOW (ref 40.0–52.0)
HEMOGLOBIN: 8.1 g/dL — AB (ref 13.0–18.0)
MCH: 27.5 pg (ref 26.0–34.0)
MCHC: 31.5 g/dL — AB (ref 32.0–36.0)
MCV: 87.3 fL (ref 80.0–100.0)
Platelets: 234 10*3/uL (ref 150–440)
RBC: 2.95 MIL/uL — AB (ref 4.40–5.90)
RDW: 16.5 % — ABNORMAL HIGH (ref 11.5–14.5)
WBC: 21.9 10*3/uL — ABNORMAL HIGH (ref 3.8–10.6)

## 2015-12-20 LAB — GLUCOSE, CAPILLARY
GLUCOSE-CAPILLARY: 167 mg/dL — AB (ref 65–99)
GLUCOSE-CAPILLARY: 180 mg/dL — AB (ref 65–99)
GLUCOSE-CAPILLARY: 184 mg/dL — AB (ref 65–99)
Glucose-Capillary: 211 mg/dL — ABNORMAL HIGH (ref 65–99)
Glucose-Capillary: 184 mg/dL — ABNORMAL HIGH (ref 65–99)
Glucose-Capillary: 212 mg/dL — ABNORMAL HIGH (ref 65–99)

## 2015-12-20 LAB — CULTURE, RESPIRATORY

## 2015-12-20 LAB — MAGNESIUM: MAGNESIUM: 1.8 mg/dL (ref 1.7–2.4)

## 2015-12-20 LAB — PHOSPHORUS: Phosphorus: 2.3 mg/dL — ABNORMAL LOW (ref 2.5–4.6)

## 2015-12-20 MED ORDER — VITAL HIGH PROTEIN PO LIQD
1000.0000 mL | ORAL | Status: DC
  Administered 2015-12-20 – 2015-12-23 (×4): 1000 mL

## 2015-12-20 MED ORDER — AMIODARONE HCL 200 MG PO TABS: 200.0000 mg | ORAL_TABLET | Freq: Two times a day (BID) | ORAL | Status: DC

## 2015-12-20 MED ORDER — K PHOS MONO-SOD PHOS DI & MONO 155-852-130 MG PO TABS
500.0000 mg | ORAL_TABLET | Freq: Two times a day (BID) | ORAL | Status: AC
  Administered 2015-12-20 (×2): 500 mg
  Filled 2015-12-20 (×3): qty 2

## 2015-12-20 MED ORDER — AMIODARONE HCL 200 MG PO TABS
200.0000 mg | ORAL_TABLET | Freq: Every day | ORAL | Status: DC
  Administered 2015-12-20 – 2015-12-25 (×6): 200 mg
  Filled 2015-12-20 (×6): qty 1

## 2015-12-20 MED ORDER — K PHOS MONO-SOD PHOS DI & MONO 155-852-130 MG PO TABS
500.0000 mg | ORAL_TABLET | ORAL | Status: DC
  Filled 2015-12-20 (×2): qty 2

## 2015-12-20 MED ORDER — MIDAZOLAM HCL 2 MG/2ML IJ SOLN
1.0000 mg | INTRAMUSCULAR | Status: DC | PRN
  Administered 2015-12-20: 1 mg via INTRAVENOUS
  Filled 2015-12-20 (×2): qty 2

## 2015-12-20 MED ORDER — FENTANYL CITRATE (PF) 100 MCG/2ML IJ SOLN
50.0000 ug | INTRAMUSCULAR | Status: DC | PRN
  Administered 2015-12-20 (×4): 50 ug via INTRAVENOUS
  Filled 2015-12-20 (×3): qty 2

## 2015-12-20 MED ORDER — MIDAZOLAM HCL 2 MG/2ML IJ SOLN
2.0000 mg | INTRAMUSCULAR | Status: DC | PRN
  Administered 2015-12-20 (×2): 2 mg via INTRAVENOUS
  Administered 2015-12-21: 3 mg via INTRAVENOUS
  Administered 2015-12-22: 2 mg via INTRAVENOUS
  Administered 2015-12-22 – 2015-12-27 (×5): 4 mg via INTRAVENOUS
  Administered 2015-12-27: 2 mg via INTRAVENOUS
  Administered 2016-01-01 – 2016-01-03 (×3): 4 mg via INTRAVENOUS
  Filled 2015-12-20: qty 4
  Filled 2015-12-20: qty 2
  Filled 2015-12-20: qty 4
  Filled 2015-12-20: qty 2
  Filled 2015-12-20: qty 4
  Filled 2015-12-20 (×2): qty 2
  Filled 2015-12-20: qty 6
  Filled 2015-12-20: qty 4
  Filled 2015-12-20: qty 2
  Filled 2015-12-20 (×5): qty 4

## 2015-12-20 MED ORDER — MIDAZOLAM HCL 2 MG/2ML IJ SOLN
1.0000 mg | INTRAMUSCULAR | Status: DC | PRN
  Administered 2015-12-20 (×3): 1 mg via INTRAVENOUS
  Filled 2015-12-20 (×3): qty 2

## 2015-12-20 MED ORDER — BISACODYL 10 MG RE SUPP: 10.0000 mg | Freq: Every day | RECTAL | Status: DC | PRN

## 2015-12-20 MED ORDER — FENTANYL 2500MCG IN NS 250ML (10MCG/ML) PREMIX INFUSION
25.0000 ug/h | INTRAVENOUS | Status: DC
  Administered 2015-12-20: 50 ug/h via INTRAVENOUS
  Administered 2015-12-22: 75 ug/h via INTRAVENOUS
  Administered 2015-12-23: 100 ug/h via INTRAVENOUS
  Administered 2015-12-23 – 2015-12-25 (×4): 200 ug/h via INTRAVENOUS
  Filled 2015-12-20 (×6): qty 250

## 2015-12-20 MED ORDER — FREE WATER
200.0000 mL | Freq: Three times a day (TID) | Status: DC
  Administered 2015-12-20 – 2015-12-21 (×3): 200 mL

## 2015-12-20 MED ORDER — ASPIRIN 81 MG PO CHEW
162.0000 mg | CHEWABLE_TABLET | Freq: Every day | ORAL | Status: DC
  Administered 2015-12-20 – 2015-12-25 (×6): 162 mg
  Filled 2015-12-20 (×6): qty 2

## 2015-12-20 MED ORDER — SENNOSIDES 8.8 MG/5ML PO SYRP: 5.0000 mL | ORAL_SOLUTION | Freq: Two times a day (BID) | ORAL | Status: DC | PRN

## 2015-12-20 MED ORDER — FENTANYL CITRATE (PF) 100 MCG/2ML IJ SOLN
50.0000 ug | INTRAMUSCULAR | Status: DC | PRN
  Administered 2015-12-20: 50 ug via INTRAVENOUS
  Filled 2015-12-20 (×3): qty 2

## 2015-12-20 NOTE — Progress Notes (Addendum)
[facility] NOTE  Pharmacy Consult for electrolyte replacement     No Known Allergies  Patient Measurements: Height: '6\' 4"'$  (193 cm) Weight: 142 lb 6.7 oz (64.6 kg) IBW/kg (Calculated) : 86.8   Vital Signs: Temp: 100.2 F (37.9 C) (02/25 0600) Temp Source: Oral (02/25 0600) BP: 114/60 mmHg (02/25 0600) Pulse Rate: 83 (02/25 0600) Intake/Output from previous day: 02/24 0701 - 02/25 0700 In: 4071.8 [I.V.:2791.8; NG/GT:1280] Out: 2300 [Urine:2250; Stool:50] Intake/Output from this shift:   Vent settings for last 24 hours: Vent Mode:  [-] PRVC FiO2 (%):  [35 %] 35 % Set Rate:  [15 bmp] 15 bmp Vt Set:  [500 mL] 500 mL PEEP:  [5 cmH20] 5 cmH20 Plateau Pressure:  [16 cmH20-17 cmH20] 17 cmH20  Labs:  Recent Labs  12/18/15 0526 12/18/15 1310 12/19/15 0600 12/20/15 0542  WBC  --   --  28.9* 21.9*  HGB  --   --  7.8* 8.1*  HCT  --   --  24.9* 25.8*  PLT  --   --  195 234  CREATININE 1.91*  --  1.35* 1.32*  MG 2.0 1.9  --  1.8  PHOS 2.8  --   --  2.3*   Estimated Creatinine Clearance: 47.6 mL/min (by C-G formula based on Cr of 1.32).   Recent Labs  12/19/15 1940 12/20/15 0049 12/20/15 0344  GLUCAP 167* 184* 180*    Microbiology: Recent Results (from the past 720 hour(s))  Culture, respiratory (NON-Expectorated)     Status: None   Collection Time: 12/06/15  4:05 PM  Result Value Ref Range Status   Specimen Description NASAL SWAB  Final   Special Requests NONE  Final   Gram Stain RARE WBC SEEN FEW GRAM POSITIVE COCCI   Final   Culture HEAVY GROWTH STAPHYLOCOCCUS AUREUS  Final   Report Status 12/09/2015 FINAL  Final   Organism ID, Bacteria STAPHYLOCOCCUS AUREUS  Final      Susceptibility   Staphylococcus aureus - MIC*    CIPROFLOXACIN 2 INTERMEDIATE Intermediate     GENTAMICIN <=0.5 SENSITIVE Sensitive     OXACILLIN <=0.25 SENSITIVE Sensitive     TETRACYCLINE <=1 SENSITIVE Sensitive     VANCOMYCIN 1 SENSITIVE Sensitive    TRIMETH/SULFA <=10 SENSITIVE Sensitive     RIFAMPIN <=0.5 SENSITIVE Sensitive     Inducible Clindamycin NEGATIVE Sensitive     * HEAVY GROWTH STAPHYLOCOCCUS AUREUS  Culture, blood (Routine X 2) w Reflex to ID Panel     Status: None   Collection Time: 12/07/15 12:09 AM  Result Value Ref Range Status   Specimen Description BLOOD BLOOD RIGHT HAND  Final   Special Requests BOTTLES DRAWN AEROBIC AND ANAEROBIC 10CC  Final   Culture NO GROWTH 6 DAYS  Final   Report Status 12/13/2015 FINAL  Final  Culture, blood (Routine X 2) w Reflex to ID Panel     Status: None   Collection Time: 12/07/15 12:09 AM  Result Value Ref Range Status   Specimen Description BLOOD BLOOD LEFT HAND  Final   Special Requests BOTTLES DRAWN AEROBIC AND ANAEROBIC 10CC  Final   Culture NO GROWTH 6 DAYS  Final   Report Status 12/13/2015 FINAL  Final  Urine culture     Status: None   Collection Time: 12/07/15 12:11 AM  Result Value Ref Range Status   Specimen Description URINE, CLEAN CATCH  Final   Special Requests NONE  Final   Culture NO GROWTH 1 DAY  Final   Report Status 12/08/2015 FINAL  Final  MRSA PCR Screening     Status: None   Collection Time: 12/08/15  9:12 AM  Result Value Ref Range Status   MRSA by PCR NEGATIVE NEGATIVE Final    Comment:        The GeneXpert MRSA Assay (FDA approved for NASAL specimens only), is one component of a comprehensive MRSA colonization surveillance program. It is not intended to diagnose MRSA infection nor to guide or monitor treatment for MRSA infections.   C difficile quick scan w PCR reflex     Status: None   Collection Time: 12/11/15  1:54 PM  Result Value Ref Range Status   C Diff antigen NEGATIVE NEGATIVE Final   C Diff toxin NEGATIVE NEGATIVE Final   C Diff interpretation Negative for C. difficile  Final  Culture, blood (Routine X 2) w Reflex to ID Panel     Status: None   Collection Time: 12/13/15  9:33 AM  Result Value Ref Range Status   Specimen Description  BLOOD LEFT ANTECUBITAL  Final   Special Requests BOTTLES DRAWN AEROBIC AND ANAEROBIC  4CC  Final   Culture NO GROWTH 5 DAYS  Final   Report Status 12/18/2015 FINAL  Final  Culture, blood (Routine X 2) w Reflex to ID Panel     Status: None   Collection Time: 12/13/15  9:41 AM  Result Value Ref Range Status   Specimen Description BLOOD RIGHT ARM  Final   Special Requests BOTTLES DRAWN AEROBIC AND ANAEROBIC  2CC  Final   Culture NO GROWTH 5 DAYS  Final   Report Status 12/18/2015 FINAL  Final  Urine culture     Status: None   Collection Time: 12/13/15 11:01 AM  Result Value Ref Range Status   Specimen Description URINE, CATHETERIZED  Final   Special Requests NONE  Final   Culture NO GROWTH 1 DAY  Final   Report Status 12/14/2015 FINAL  Final  Culture, expectorated sputum-assessment     Status: None   Collection Time: 12/13/15 11:30 AM  Result Value Ref Range Status   Specimen Description TRACHEAL ASPIRATE  Final   Special Requests NONE  Final   Sputum evaluation THIS SPECIMEN IS ACCEPTABLE FOR SPUTUM CULTURE  Final   Report Status 12/13/2015 FINAL  Final  Culture, respiratory (NON-Expectorated)     Status: None   Collection Time: 12/13/15 11:30 AM  Result Value Ref Range Status   Specimen Description TRACHEAL ASPIRATE  Final   Special Requests NONE Reflexed from [id number]  Final   Gram Stain   Final    FEW WBC SEEN FEW GRAM POSITIVE COCCI FEW YEAST FAIR SPECIMEN - 70-80% WBCS    Culture LIGHT GROWTH CANDIDA ALBICANS  Final   Report Status 12/17/2015 FINAL  Final  Cath Tip Culture     Status: None   Collection Time: 12/14/15 12:47 PM  Result Value Ref Range Status   Specimen Description CATH TIP  Final   Special Requests NONE  Final   Culture NO GROWTH 3 DAYS  Final   Report Status 12/17/2015 FINAL  Final  Urine culture     Status: None   Collection Time: 12/16/15  8:19 AM  Result Value Ref Range Status   Specimen Description URINE, RANDOM  Final   Special Requests Normal   Final   Culture NO GROWTH 1 DAY  Final   Report Status 12/17/2015 FINAL  Final  CULTURE, BLOOD (ROUTINE X 2) w  Reflex to PCR ID Panel     Status: None (Preliminary result)   Collection Time: 12/16/15  8:28 AM  Result Value Ref Range Status   Specimen Description BLOOD LEFT ARM  Final   Special Requests BOTTLES DRAWN AEROBIC AND ANAEROBIC 10ML  Final   Culture NO GROWTH 3 DAYS  Final   Report Status PENDING  Incomplete  CULTURE, BLOOD (ROUTINE X 2) w Reflex to PCR ID Panel     Status: None (Preliminary result)   Collection Time: 12/16/15  8:45 AM  Result Value Ref Range Status   Specimen Description BLOOD RIGHT ARM  Final   Special Requests BOTTLES DRAWN AEROBIC AND ANAEROBIC 10ML  Final   Culture NO GROWTH 3 DAYS  Final   Report Status PENDING  Incomplete  C difficile quick scan w PCR reflex     Status: None   Collection Time: 12/16/15  4:26 PM  Result Value Ref Range Status   C Diff antigen NEGATIVE NEGATIVE Final   C Diff toxin NEGATIVE NEGATIVE Final   C Diff interpretation Negative for C. difficile  Final  Culture, expectorated sputum-assessment     Status: None   Collection Time: 12/16/15  5:32 PM  Result Value Ref Range Status   Specimen Description SPUTUM  Final   Special Requests NONE  Final   Sputum evaluation THIS SPECIMEN IS ACCEPTABLE FOR SPUTUM CULTURE  Final   Report Status 12/16/2015 FINAL  Final  Culture, respiratory (NON-Expectorated)     Status: None (Preliminary result)   Collection Time: 12/16/15  5:32 PM  Result Value Ref Range Status   Specimen Description SPUTUM  Final   Special Requests NONE Reflexed from [id number]  Final   Gram Stain   Final    MANY WBC SEEN RARE SQUAMOUS EPITHELIAL CELLS PRESENT FEW YEAST EXCELLENT SPECIMEN - 90-100% WBCS    Culture HOLDING FOR POSSIBLE PATHOGEN  Final   Report Status PENDING  Incomplete  MRSA PCR Screening     Status: None   Collection Time: 12/17/15 11:50 AM  Result Value Ref Range Status   MRSA by PCR NEGATIVE  NEGATIVE Final    Comment:        The GeneXpert MRSA Assay (FDA approved for NASAL specimens only), is one component of a comprehensive MRSA colonization surveillance program. It is not intended to diagnose MRSA infection nor to guide or monitor treatment for MRSA infections.   Culture, respiratory (NON-Expectorated)     Status: None (Preliminary result)   Collection Time: 12/18/15 12:15 AM  Result Value Ref Range Status   Specimen Description TRACHEAL ASPIRATE  Final   Special Requests NONE  Final   Gram Stain PENDING  Incomplete   Culture   Final    LIGHT GROWTH YEAST IDENTIFICATION TO FOLLOW ONCE ISOLATED    Report Status PENDING  Incomplete  Urine culture     Status: None   Collection Time: 12/18/15 12:24 AM  Result Value Ref Range Status   Specimen Description URINE, RANDOM  Final   Special Requests NONE  Final   Culture NO GROWTH 1 DAY  Final   Report Status 12/19/2015 FINAL  Final  Culture, blood (Routine X 2) w Reflex to ID Panel     Status: None (Preliminary result)   Collection Time: 12/18/15  4:29 AM  Result Value Ref Range Status   Specimen Description BLOOD RIGHT HAND  Final   Special Requests BOTTLES DRAWN AEROBIC AND ANAEROBIC 5ML  Final   Culture NO GROWTH  1 DAY  Final   Report Status PENDING  Incomplete  Culture, blood (Routine X 2) w Reflex to ID Panel     Status: None (Preliminary result)   Collection Time: 12/18/15  4:37 AM  Result Value Ref Range Status   Specimen Description BLOOD LEFT ARM  Final   Special Requests BOTTLES DRAWN AEROBIC AND ANAEROBIC 10ML  Final   Culture NO GROWTH 1 DAY  Final   Report Status PENDING  Incomplete    Medications:  Scheduled:  . amiodarone  150 mg Intravenous Once  . amiodarone  200 mg Oral BID  . antiseptic oral rinse  7 mL Mouth Rinse QID  . aspirin  162 mg Oral Daily  . chlorhexidine gluconate  15 mL Mouth Rinse BID  . feeding supplement (PRO-STAT SUGAR FREE 64)  30 mL Per Tube Daily  . free water  200 mL  Per Tube Q4H  . heparin subcutaneous  5,000 Units Subcutaneous Q12H  . insulin aspart  0-20 Units Subcutaneous 6 times per day  . insulin glargine  18 Units Subcutaneous Daily  . metoprolol  2.5 mg Intravenous 4 times per day  . pantoprazole sodium  40 mg Per Tube Daily  . piperacillin-tazobactam (ZOSYN)  IV  3.375 g Intravenous 3 times per day  . sodium chloride  500 mL Intravenous Once   Infusions:  . 0.45 % NaCl with KCl 20 mEq / L 100 mL/hr at 12/20/15 0537  . feeding supplement (VITAL HIGH PROTEIN) 1,000 mL (12/20/15 0100)  . nitroGLYCERIN    . propofol (DIPRIVAN) infusion 40 mcg/kg/min (12/20/15 0537)    Assessment: Pharmacy consulted for electrolyte management for 70 yo male ICU patient requiring mechanical ventilation.     Plan:  Potassium 3.8 and Mag 1.8 WNL, Phos low at 2.3   Patient now receiving MD orders for KPhos tabs BID.  Pharmacy will continue to monitor and adjust per consult.    Vena Rua 12/20/2015,7:42 AM

## 2015-12-20 NOTE — Progress Notes (Addendum)
Nutrition Follow-up     INTERVENTION:  EN: Recommend increasing tube feeding by 77m q 4 hr until new goal rate of 772mhr of vital high protein.  Provides 1680 kcals and 147 g of protein and 141127mree water.  Recommend discontinuing prostat at this time with increase in tube feeding rate.    NUTRITION DIAGNOSIS:   Inadequate oral intake related to inability to eat as evidenced by NPO status being addressed with tube feeding    GOAL:   Patient will meet greater than or equal to 90% of their needs    MONITOR:    (Energy Intake, Electrolyte and renal Profile, Anthropometrics, Digestive System, Glucose Profile, Pulmonary Profile)  REASON FOR ASSESSMENT:   Ventilator, Consult Enteral/tube feeding initiation and management  ASSESSMENT:   Pt remains on vent, diprivan is currently off  Current Nutrition: tolerating vital high protein at 26m71m with prostat q daily per RN, SandKatharine Lookastrointestinal Profile: Last BM: rectal tube in place, noted 50ml54mput   Scheduled Medications:  . amiodarone  200 mg Per Tube Daily  . antiseptic oral rinse  7 mL Mouth Rinse QID  . aspirin  162 mg Per Tube Daily  . chlorhexidine gluconate  15 mL Mouth Rinse BID  . feeding supplement (PRO-STAT SUGAR FREE 64)  30 mL Per Tube Daily  . free water  200 mL Per Tube 3 times per day  . heparin subcutaneous  5,000 Units Subcutaneous Q12H  . insulin aspart  0-20 Units Subcutaneous 6 times per day  . insulin glargine  18 Units Subcutaneous Daily  . pantoprazole sodium  40 mg Per Tube Daily  . phosphorus  500 mg Per Tube BID  . piperacillin-tazobactam (ZOSYN)  IV  3.375 g Intravenous 3 times per day    Continuous Medications:  . feeding supplement (VITAL HIGH PROTEIN) 1,000 mL (12/20/15 0100)     Electrolyte/Renal Profile and Glucose Profile:   Recent Labs Lab 12/17/15 0421  12/18/15 0526  12/18/15 1310 12/19/15 0600 12/19/15 1715 12/20/15 0542  NA 154*  < > 153*  153*  < > 150*  148*  --  140  K 2.6*  --  3.4*  --   --  3.1* 3.8 3.8  CL 116*  --  118*  --   --  118*  --  113*  CO2 27  --  28  --   --  24  --  22  BUN 52*  --  42*  --   --  35*  --  32*  CREATININE 2.40*  --  1.91*  --   --  1.35*  --  1.32*  CALCIUM 7.3*  --  7.0*  --   --  6.8*  --  6.8*  MG 2.0  --  2.0  --  1.9  --   --  1.8  PHOS 3.3  --  2.8  --   --   --   --  2.3*  GLUCOSE 232*  --  267*  --   --  166*  --  186*  < > = values in this interval not displayed.      Weight Trend since Admission: Filed Weights   12/20/15 0412  Weight: 142 lb 6.7 oz (64.6 kg)       Diet Order:   NPO  Skin:   reviewed  Last BM:  12/07/2015  Height:   Ht Readings from Last 1 Encounters:  12/05/15 '6\' 4"'$  (1.93 m)  Weight:   Wt Readings from Last 1 Encounters:  12/20/15 142 lb 6.7 oz (64.6 kg)    Ideal Body Weight:     BMI:  Body mass index is 17.34 kg/(m^2).  Estimated Nutritional Needs:   Kcal:  1711 kcals (Ve: 15.6, Tmax: 38.6) using wt of 72 kg, legnth of 44 inches  Protein:  108-144 g (1.5-2.0 g/kg)   Fluid:  1800-2160 mL (25-30 ml/kg)   EDUCATION NEEDS:   No education needs identified at this time  HIGH Care Level  Kierrah Kilbride B. Zenia Resides, [place], [place] (pager) Weekend/On-Call pager [phone])

## 2015-12-20 NOTE — Progress Notes (Signed)
eLink Physician-Brief Progress Note Patient Name: Stephen Camacho DOB: [birth year] MRN: [id number]   Date of Service  12/20/2015  HPI/Events of Note  Agitation off propofol drip  eICU Interventions  Try fentnyl drip and prn versed first and if fails restart propofol     Intervention Category Major Interventions: Delirium, psychosis, severe agitation - evaluation and management  Christinia Gully 12/20/2015, 9:40 PM

## 2015-12-20 NOTE — Progress Notes (Signed)
[facility]  Daily Progress Note  Subjective: 8 Days Post-Op: Left above-the-knee amputation.  Patient sedated and intubated - unable to respond to questions. No acute distress.   Objective: Filed Vitals:   12/20/15 0800 12/20/15 0900 12/20/15 1000 12/20/15 1100  BP: 120/62 147/77 152/85 124/74  Pulse: 87 94 110 94  Temp: 100 F (37.8 C)     TempSrc:      Resp: 25 30 33 21  Height:      Weight:      SpO2: 100% 100% 99% 99%    Intake/Output Summary (Last 24 hours) at 12/20/15 1203 Last data filed at 12/20/15 0600  Gross per 24 hour  Intake 4071.76 ml  Output   2300 ml  Net 1771.76 ml   Physical Exam: Sedated on vent. Unable to answer questions.  Neck: Right IJ - no signs of infection noted. CV: RRR, Tachy at times Pulmonary: On Vent. Course breath sounds. Left PICC line: Intact, no signs of infection Abdomen: Soft, Non-tender, Non-distended.  Rectal Tube in place.  Oral Feeding Tube: In place GU: Foley in place  Vascular: Left Lower Extremity: Staples clean , dry and intact. No drainage. Stump edematous but healthy.   Laboratory: CBC    Component Value Date/Time   WBC 21.9* 12/20/2015 0542   WBC 4.9 10/25/2014 1331   HGB 8.1* 12/20/2015 0542   HGB 12.1* 10/25/2014 1331   HCT 25.8* 12/20/2015 0542   HCT 38.1* 10/25/2014 1331   PLT 234 12/20/2015 0542   PLT 151 10/25/2014 1331   BMET    Component Value Date/Time   NA 140 12/20/2015 0542   NA 138 10/25/2014 1331   K 3.8 12/20/2015 0542   K 3.9 10/25/2014 1331   CL 113* 12/20/2015 0542   CL 109* 10/25/2014 1331   CO2 22 12/20/2015 0542   CO2 21 10/25/2014 1331   GLUCOSE 186* 12/20/2015 0542   GLUCOSE 232* 10/25/2014 1331   BUN 32* 12/20/2015 0542   BUN 17 10/25/2014 1331   CREATININE 1.32* 12/20/2015 0542   CREATININE 1.11 10/25/2014 1331   CALCIUM 6.8* 12/20/2015 0542   CALCIUM 8.7 10/25/2014 1331   GFRNONAA 53* 12/20/2015 0542   GFRNONAA >60 10/25/2014 1331   GFRNONAA 52*  07/08/2014 1051   GFRAA >60 12/20/2015 0542   GFRAA >60 10/25/2014 1331   GFRAA >60 07/08/2014 1051   Assessment/Planning: 70 year old male s/p 8 Days Post-Op: left above-the-knee amputation transferred back to ICU for respiratory distress, now intubated. 1) Stump healing well 2) Unable to wean off vent - possible trach on Thursday if family decides to proceed. 3) Awaiting recommendations for PEG placement  Marcelle Overlie PA-C 12/20/2015 12:03 PM

## 2015-12-20 NOTE — Progress Notes (Signed)
[facility]  ROUNDING NOTE   Subjective:  Intubated 2/21, remains on ventilator Na level has improved to 140 potassium is now normal UOP is good ~ 3250 cc S Cr is stable    Objective:  Vital signs in last 24 hours:  Temp:  [99 F (37.2 C)-101.8 F (38.8 C)] 100 F (37.8 C) (02/25 0800) Pulse Rate:  [76-91] 87 (02/25 0800) Resp:  [19-26] 25 (02/25 0800) BP: (111-126)/(58-68) 120/62 mmHg (02/25 0800) SpO2:  [98 %-100 %] 100 % (02/25 0800) FiO2 (%):  [35 %] 35 % (02/25 0722) Weight:  [64.6 kg (142 lb 6.7 oz)] 64.6 kg (142 lb 6.7 oz) (02/25 0412)  Weight change:  Filed Weights   12/20/15 0412  Weight: 64.6 kg (142 lb 6.7 oz)    Intake/Output: I/O last 3 completed shifts: In: 6604.9 [I.V.:4079.9; NG/GT:2175; IV Piggyback:350] Out: 6269 [Urine:4030; Stool:525]   Intake/Output this shift:     Physical Exam: General: Critically ill appearing  Head: normocephalic  E/ENT: ETT,  OGT  Neck: Right central line IJ  Lungs:  Vent assisted, b/l crackles and rhonchi  Heart: Tachycardic, regular  Abdomen:  Soft, nontender  Extremities: Right AKA, left AKA (new), ++ dependent edema over extremities  Neurologic: sedated  Skin: No lesions     Foley, rectal tube    Basic Metabolic Panel:  Recent Labs Lab 12/14/15 0213 12/15/15 0500 12/16/15 0440 12/17/15 0421  12/18/15 0526 12/18/15 0904 12/18/15 1310 12/19/15 0600 12/19/15 1715 12/20/15 0542  NA 148* 147* 151* 154*  < > 153*  153* 151* 150* 148*  --  140  K 3.3* 3.6 3.5 2.6*  --  3.4*  --   --  3.1* 3.8 3.8  CL 112* 115* 118* 116*  --  118*  --   --  118*  --  113*  CO2 26 22 21* 27  --  28  --   --  24  --  22  GLUCOSE 248* 172* 229* 232*  --  267*  --   --  166*  --  186*  BUN 36* 50* 60* 52*  --  42*  --   --  35*  --  32*  CREATININE 2.02* 2.37* 2.42* 2.40*  --  1.91*  --   --  1.35*  --  1.32*  CALCIUM 7.4* 7.3* 7.4* 7.3*  --  7.0*  --   --  6.8*  --  6.8*  MG 1.9 2.0 2.2 2.0  --  2.0  --  1.9   --   --  1.8  PHOS 2.5 3.7  --  3.3  --  2.8  --   --   --   --  2.3*  < > = values in this interval not displayed.  Liver Function Tests: No results for input(s): AST, ALT, ALKPHOS, BILITOT, PROT, ALBUMIN in the last 168 hours. No results for input(s): LIPASE, AMYLASE in the last 168 hours. No results for input(s): AMMONIA in the last 168 hours.  CBC:  Recent Labs Lab 12/15/15 0500 12/16/15 0440 12/17/15 0421 12/19/15 0600 12/20/15 0542  WBC 19.1* 28.5* 36.9* 28.9* 21.9*  HGB 8.1* 8.4* 8.4* 7.8* 8.1*  HCT 24.4* 25.8* 26.7* 24.9* 25.8*  MCV 84.4 86.7 86.9 88.8 87.3  PLT 213 204 190 195 234    Cardiac Enzymes:  Recent Labs Lab 12/13/15 2020 12/14/15 0213 12/14/15 0809 12/16/15 0440  TROPONINI 2.03* 2.67* 3.00* 2.70*    BNP: Invalid input(s): POCBNP  CBG:  Recent Labs Lab 12/19/15 1611 12/19/15 1940 12/20/15 0049 12/20/15 0344 12/20/15 0737  GLUCAP 147* 167* 184* 180* 167*    Microbiology: Results for orders placed or performed during the hospital encounter of 12/05/15  Culture, respiratory (NON-Expectorated)     Status: None   Collection Time: 12/06/15  4:05 PM  Result Value Ref Range Status   Specimen Description NASAL SWAB  Final   Special Requests NONE  Final   Gram Stain RARE WBC SEEN FEW GRAM POSITIVE COCCI   Final   Culture HEAVY GROWTH STAPHYLOCOCCUS AUREUS  Final   Report Status 12/09/2015 FINAL  Final   Organism ID, Bacteria STAPHYLOCOCCUS AUREUS  Final      Susceptibility   Staphylococcus aureus - MIC*    CIPROFLOXACIN 2 INTERMEDIATE Intermediate     GENTAMICIN <=0.5 SENSITIVE Sensitive     OXACILLIN <=0.25 SENSITIVE Sensitive     TETRACYCLINE <=1 SENSITIVE Sensitive     VANCOMYCIN 1 SENSITIVE Sensitive     TRIMETH/SULFA <=10 SENSITIVE Sensitive     RIFAMPIN <=0.5 SENSITIVE Sensitive     Inducible Clindamycin NEGATIVE Sensitive     * HEAVY GROWTH STAPHYLOCOCCUS AUREUS  Culture, blood (Routine X 2) w Reflex to ID Panel     Status:  None   Collection Time: 12/07/15 12:09 AM  Result Value Ref Range Status   Specimen Description BLOOD BLOOD RIGHT HAND  Final   Special Requests BOTTLES DRAWN AEROBIC AND ANAEROBIC 10CC  Final   Culture NO GROWTH 6 DAYS  Final   Report Status 12/13/2015 FINAL  Final  Culture, blood (Routine X 2) w Reflex to ID Panel     Status: None   Collection Time: 12/07/15 12:09 AM  Result Value Ref Range Status   Specimen Description BLOOD BLOOD LEFT HAND  Final   Special Requests BOTTLES DRAWN AEROBIC AND ANAEROBIC 10CC  Final   Culture NO GROWTH 6 DAYS  Final   Report Status 12/13/2015 FINAL  Final  Urine culture     Status: None   Collection Time: 12/07/15 12:11 AM  Result Value Ref Range Status   Specimen Description URINE, CLEAN CATCH  Final   Special Requests NONE  Final   Culture NO GROWTH 1 DAY  Final   Report Status 12/08/2015 FINAL  Final  MRSA PCR Screening     Status: None   Collection Time: 12/08/15  9:12 AM  Result Value Ref Range Status   MRSA by PCR NEGATIVE NEGATIVE Final    Comment:        The GeneXpert MRSA Assay (FDA approved for NASAL specimens only), is one component of a comprehensive MRSA colonization surveillance program. It is not intended to diagnose MRSA infection nor to guide or monitor treatment for MRSA infections.   C difficile quick scan w PCR reflex     Status: None   Collection Time: 12/11/15  1:54 PM  Result Value Ref Range Status   C Diff antigen NEGATIVE NEGATIVE Final   C Diff toxin NEGATIVE NEGATIVE Final   C Diff interpretation Negative for C. difficile  Final  Culture, blood (Routine X 2) w Reflex to ID Panel     Status: None   Collection Time: 12/13/15  9:33 AM  Result Value Ref Range Status   Specimen Description BLOOD LEFT ANTECUBITAL  Final   Special Requests BOTTLES DRAWN AEROBIC AND ANAEROBIC  4CC  Final   Culture NO GROWTH 5 DAYS  Final   Report Status 12/18/2015 FINAL  Final  Culture, blood (Routine X 2) w Reflex to ID Panel      Status: None   Collection Time: 12/13/15  9:41 AM  Result Value Ref Range Status   Specimen Description BLOOD RIGHT ARM  Final   Special Requests BOTTLES DRAWN AEROBIC AND ANAEROBIC  2CC  Final   Culture NO GROWTH 5 DAYS  Final   Report Status 12/18/2015 FINAL  Final  Urine culture     Status: None   Collection Time: 12/13/15 11:01 AM  Result Value Ref Range Status   Specimen Description URINE, CATHETERIZED  Final   Special Requests NONE  Final   Culture NO GROWTH 1 DAY  Final   Report Status 12/14/2015 FINAL  Final  Culture, expectorated sputum-assessment     Status: None   Collection Time: 12/13/15 11:30 AM  Result Value Ref Range Status   Specimen Description TRACHEAL ASPIRATE  Final   Special Requests NONE  Final   Sputum evaluation THIS SPECIMEN IS ACCEPTABLE FOR SPUTUM CULTURE  Final   Report Status 12/13/2015 FINAL  Final  Culture, respiratory (NON-Expectorated)     Status: None   Collection Time: 12/13/15 11:30 AM  Result Value Ref Range Status   Specimen Description TRACHEAL ASPIRATE  Final   Special Requests NONE Reflexed from [id number]  Final   Gram Stain   Final    FEW WBC SEEN FEW GRAM POSITIVE COCCI FEW YEAST FAIR SPECIMEN - 70-80% WBCS    Culture LIGHT GROWTH CANDIDA ALBICANS  Final   Report Status 12/17/2015 FINAL  Final  Cath Tip Culture     Status: None   Collection Time: 12/14/15 12:47 PM  Result Value Ref Range Status   Specimen Description CATH TIP  Final   Special Requests NONE  Final   Culture NO GROWTH 3 DAYS  Final   Report Status 12/17/2015 FINAL  Final  Urine culture     Status: None   Collection Time: 12/16/15  8:19 AM  Result Value Ref Range Status   Specimen Description URINE, RANDOM  Final   Special Requests Normal  Final   Culture NO GROWTH 1 DAY  Final   Report Status 12/17/2015 FINAL  Final  CULTURE, BLOOD (ROUTINE X 2) w Reflex to PCR ID Panel     Status: None (Preliminary result)   Collection Time: 12/16/15  8:28 AM  Result Value Ref  Range Status   Specimen Description BLOOD LEFT ARM  Final   Special Requests BOTTLES DRAWN AEROBIC AND ANAEROBIC 10ML  Final   Culture NO GROWTH 3 DAYS  Final   Report Status PENDING  Incomplete  CULTURE, BLOOD (ROUTINE X 2) w Reflex to PCR ID Panel     Status: None (Preliminary result)   Collection Time: 12/16/15  8:45 AM  Result Value Ref Range Status   Specimen Description BLOOD RIGHT ARM  Final   Special Requests BOTTLES DRAWN AEROBIC AND ANAEROBIC 10ML  Final   Culture NO GROWTH 3 DAYS  Final   Report Status PENDING  Incomplete  C difficile quick scan w PCR reflex     Status: None   Collection Time: 12/16/15  4:26 PM  Result Value Ref Range Status   C Diff antigen NEGATIVE NEGATIVE Final   C Diff toxin NEGATIVE NEGATIVE Final   C Diff interpretation Negative for C. difficile  Final  Culture, expectorated sputum-assessment     Status: None   Collection Time: 12/16/15  5:32 PM  Result Value Ref Range Status  Specimen Description SPUTUM  Final   Special Requests NONE  Final   Sputum evaluation THIS SPECIMEN IS ACCEPTABLE FOR SPUTUM CULTURE  Final   Report Status 12/16/2015 FINAL  Final  Culture, respiratory (NON-Expectorated)     Status: None (Preliminary result)   Collection Time: 12/16/15  5:32 PM  Result Value Ref Range Status   Specimen Description SPUTUM  Final   Special Requests NONE Reflexed from [id number]  Final   Gram Stain   Final    MANY WBC SEEN RARE SQUAMOUS EPITHELIAL CELLS PRESENT FEW YEAST EXCELLENT SPECIMEN - 90-100% WBCS    Culture HOLDING FOR POSSIBLE PATHOGEN  Final   Report Status PENDING  Incomplete  MRSA PCR Screening     Status: None   Collection Time: 12/17/15 11:50 AM  Result Value Ref Range Status   MRSA by PCR NEGATIVE NEGATIVE Final    Comment:        The GeneXpert MRSA Assay (FDA approved for NASAL specimens only), is one component of a comprehensive MRSA colonization surveillance program. It is not intended to diagnose MRSA infection nor  to guide or monitor treatment for MRSA infections.   Culture, respiratory (NON-Expectorated)     Status: None (Preliminary result)   Collection Time: 12/18/15 12:15 AM  Result Value Ref Range Status   Specimen Description TRACHEAL ASPIRATE  Final   Special Requests NONE  Final   Gram Stain PENDING  Incomplete   Culture   Final    LIGHT GROWTH YEAST IDENTIFICATION TO FOLLOW ONCE ISOLATED    Report Status PENDING  Incomplete  Urine culture     Status: None   Collection Time: 12/18/15 12:24 AM  Result Value Ref Range Status   Specimen Description URINE, RANDOM  Final   Special Requests NONE  Final   Culture NO GROWTH 1 DAY  Final   Report Status 12/19/2015 FINAL  Final  Culture, blood (Routine X 2) w Reflex to ID Panel     Status: None (Preliminary result)   Collection Time: 12/18/15  4:29 AM  Result Value Ref Range Status   Specimen Description BLOOD RIGHT HAND  Final   Special Requests BOTTLES DRAWN AEROBIC AND ANAEROBIC 5ML  Final   Culture NO GROWTH 1 DAY  Final   Report Status PENDING  Incomplete  Culture, blood (Routine X 2) w Reflex to ID Panel     Status: None (Preliminary result)   Collection Time: 12/18/15  4:37 AM  Result Value Ref Range Status   Specimen Description BLOOD LEFT ARM  Final   Special Requests BOTTLES DRAWN AEROBIC AND ANAEROBIC 10ML  Final   Culture NO GROWTH 1 DAY  Final   Report Status PENDING  Incomplete    Coagulation Studies: No results for input(s): LABPROT, INR in the last 72 hours.  Urinalysis:  Recent Labs  12/18/15 0024  COLORURINE YELLOW*  LABSPEC 1.014  PHURINE 8.0  GLUCOSEU 150*  HGBUR 2+*  BILIRUBINUR NEGATIVE  KETONESUR NEGATIVE  PROTEINUR 100*  NITRITE NEGATIVE  LEUKOCYTESUR NEGATIVE      Imaging: Dg Chest 1 View  12/20/2015  CLINICAL DATA:  70 year old male with shortness of breath and respiratory failure. EXAM: CHEST 1 VIEW COMPARISON:  12/19/2015 and prior exams. FINDINGS: Endotracheal tube with tip 3.5 cm above  carina, NG tube entering the stomach with tip off the field of view, right IJ central venous catheter with tip overlying the superior cavoatrial junction and postoperative changes in both upper lungs again identified. Cardiomegaly and CABG  changes again noted. Bilateral interstitial and airspace opacities are not significantly changed. There is no evidence of pneumothorax. IMPRESSION: Unchanged appearance the chest as described. Electronically Signed   By: Margarette Canada M.D.   On: 12/20/2015 08:05   Dg Chest 1 View  12/19/2015  CLINICAL DATA:  Shortness of breath. EXAM: CHEST 1 VIEW COMPARISON:  12/18/2015 . FINDINGS: Endotracheal tube, NG tube, right IJ line in stable position. Prior CABG. Cardiomegaly. Continued partial clearing of bilateral pulmonary infiltrates consistent with partial clearing of pulmonary edema. No pleural effusion or pneumothorax . Surgical sutures noted over the upper lungs. No acute bony abnormality. IMPRESSION: 1. Lines and tubes in stable position. 2. Prior CABG. Cardiomegaly with continued partial clearing of bilateral pulmonary infiltrates consistent with partial clearing of pulmonary edema edema . Electronically Signed   By: [place]   On: 12/19/2015 07:13     Medications:   . feeding supplement (VITAL HIGH PROTEIN) 1,000 mL (12/20/15 0100)   . amiodarone  200 mg Per Tube Daily  . antiseptic oral rinse  7 mL Mouth Rinse QID  . aspirin  162 mg Per Tube Daily  . chlorhexidine gluconate  15 mL Mouth Rinse BID  . feeding supplement (PRO-STAT SUGAR FREE 64)  30 mL Per Tube Daily  . free water  200 mL Per Tube 3 times per day  . heparin subcutaneous  5,000 Units Subcutaneous Q12H  . insulin aspart  0-20 Units Subcutaneous 6 times per day  . insulin glargine  18 Units Subcutaneous Daily  . pantoprazole sodium  40 mg Per Tube Daily  . phosphorus  500 mg Per Tube BID  . piperacillin-tazobactam (ZOSYN)  IV  3.375 g Intravenous 3 times per day   acetaminophen **OR**  [DISCONTINUED] acetaminophen, bisacodyl, fentaNYL (SUBLIMAZE) injection, fentaNYL (SUBLIMAZE) injection, ipratropium-albuterol, midazolam, midazolam, ondansetron **OR** ondansetron (ZOFRAN) IV, ondansetron (ZOFRAN) IV, polyethylene glycol, sennosides  Assessment/ Plan:  Mr. Stephen Camacho is a 70 y.o. black  male with hypertension, hyperlipidemia, diabetes mellitus type II, coronary artery disease status post 2 vessel CABG, peripheral arterial disease status post right above-the-knee amputation, seizure disorder, lung cancer, prostate cancer, history of CVA, who was admitted to [facility] on 12/05/2015 for ischemic left lower extremity. Hospital course complicated by acute renal failure, acute respiratory failure, new CVAs, myocardial infarction.  1. Acute renal failure:  ATN from contrast exposure. nonoliguric urine output.  Baseline creatinine of 0.83.  - S Cr now stabilizing at 1.3   2. Hypernatremia, Na high at 151->148->140  - free water replacement via OGT 200 q8hr  3. Ischemic left lower extremity: peripheral vascular disease: status post Left AKA on 2/17 Dr. Delana Meyer   4. Acute respiratory failure: Status post intubation and on mechanical ventilation. With acute myocardial infarction and acute CVA.  - probably combination of acute pulm edema and aspiration pneumonia - re-intubated 2/21  5. Anemia with renal failure: PRBC transfusion 2/14 and 2/17.    6. Hypokalemia -replace prn  Will sign off Please re-consult as necessary    LOS: 15 Mauriah Mcmillen 2/25/20178:58 AM

## 2015-12-20 NOTE — Progress Notes (Signed)
Pt maintained on the 40 mcg propofol this shift. Only change was a temp that was treated with tylenol per peg tube

## 2015-12-20 NOTE — Progress Notes (Signed)
PULMONARY / CRITICAL CARE MEDICINE   Name: Stephen Camacho MRN: [id number] DOB: 06/04/1946    ADMISSION DATE:  12/05/2015 CONSULTATION DATE:  12/08/15  PT PROFILE:   70 YO male with 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 Now with acute respiratory failure, intubated due to pneumonia  EVENTS/DATA: 02/10 >> LLE angiogram, thrombolysis, thrombectomy, angioplasty 02/11 Intubated for suspected PNA 02/11 elevated trop I. Pk trop I 62.6 on 02/20 02/12 CT head: New nonhemorrhagic infarcts involving the right PCA territory and bilateral cerebellum, left greater than right 02/17 L AKA 02/21 TTE: LVEF 55-60% 02/21 RUE [place]: no DVT 02/22 CT head: Interval evolution of bilateral cerebellar and right PCA territory infarcts  INDWELLING DEVICES:: ETT 02/11 >> 02/18, 02/21 >>  R IJ CVL 02/10 >>   MICRO DATA: MRSA PCR 02/13 >> NEG Urine 02/12 >> NEG Resp 02/11 >> MSSA C diff 02/16 >> NEG Blood 02/12 >> NEG Urine 02/19 >> NEG C diff 02/21 >> NEG Cath tip 02/19 >> NEG Resp 02/18 >> light growth candida Urine 02/21 >> NEG MRSA PCR 02/22 >> NEG  ANTIMICROBIALS:  Vanc 02/11 >> 02/23 Pip-tazo 02/11 >> 02/20 Ceftaz 02/21 >> 02/24 Metronidazole 02/21 >> 02/24 Pip-tazo 02/24 >>   SUBJECTIVE:  RASS -3, not F/C  VITAL SIGNS: BP 124/74 mmHg  Pulse 94  Temp(Src) 100 F (37.8 C) (Oral)  Resp 21  Ht '6\' 4"'$  (1.93 m)  Wt 142 lb 6.7 oz (64.6 kg)  BMI 17.34 kg/m2  SpO2 99%  HEMODYNAMICS:    VENTILATOR SETTINGS: Vent Mode:  [-] PRVC FiO2 (%):  [35 %] 35 % Set Rate:  [15 bmp] 15 bmp Vt Set:  [500 mL] 500 mL PEEP:  [5 cmH20] 5 cmH20 Plateau Pressure:  [17 cmH20] 17 cmH20  INTAKE / OUTPUT: I/O last 3 completed shifts: In: 6604.9 [I.V.:4079.9; NG/GT:2175; IV Piggyback:350] Out: 9323  [Urine:4030; Stool:525]  PHYSICAL EXAMINATION: General: Chronically ill appearing, RASS -3, not F/C, agitated on WUA Neuro: CNs intact, MAEs HEENT: NCAT, ETT in place Cardiovascular: Regular, no M Lungs: no wheezes Abdomen: Soft, NT, +BS Ext: B AKA, L surgical wound clean and dry  LABS:  BMET  Recent Labs Lab 12/18/15 0526  12/18/15 1310 12/19/15 0600 12/19/15 1715 12/20/15 0542  NA 153*  153*  < > 150* 148*  --  140  K 3.4*  --   --  3.1* 3.8 3.8  CL 118*  --   --  118*  --  113*  CO2 28  --   --  24  --  22  BUN 42*  --   --  35*  --  32*  CREATININE 1.91*  --   --  1.35*  --  1.32*  GLUCOSE 267*  --   --  166*  --  186*  < > = values in this interval not displayed.  Electrolytes  Recent Labs Lab 12/17/15 0421 12/18/15 0526 12/18/15 1310 12/19/15 0600 12/20/15 0542  CALCIUM 7.3* 7.0*  --  6.8* 6.8*  MG 2.0 2.0 1.9  --  1.8  PHOS 3.3 2.8  --   --  2.3*    CBC  Recent Labs Lab 12/17/15 0421 12/19/15 0600 12/20/15 0542  WBC 36.9* 28.9* 21.9*  HGB 8.4* 7.8* 8.1*  HCT 26.7* 24.9* 25.8*  PLT 190 195 234    Coag's No  results for input(s): APTT, INR in the last 168 hours.  Sepsis Markers  Recent Labs Lab 12/16/15 0519 12/17/15 0421 12/18/15 0526  LATICACIDVEN 2.5*  --   --   PROCALCITON 3.52 4.32 1.84    ABG  Recent Labs Lab 12/16/15 1430 12/17/15 0445 12/18/15 0500  PHART 7.48* 7.52* 7.39  PCO2ART 36 34 45  PO2ART 83 58* 81*    Liver Enzymes No results for input(s): AST, ALT, ALKPHOS, BILITOT, ALBUMIN in the last 168 hours.  Cardiac Enzymes  Recent Labs Lab 12/14/15 0213 12/14/15 0809 12/16/15 0440  TROPONINI 2.67* 3.00* 2.70*    Glucose  Recent Labs Lab 12/19/15 1207 12/19/15 1611 12/19/15 1940 12/20/15 0049 12/20/15 0344 12/20/15 0737  GLUCAP 147* 147* 167* 184* 180* 167*    CXR: CM, patchy B interstitial and alveolar infiltrates   ASSESSMENT / PLAN:  PULMONARY A: Prolonged VDRF Failed extubation  attempt B pulmonary infiltrates - suspected PNA P:   Cont vent support - settings reviewed and/or adjusted Wean in PSV as tolerated Cont vent bundle Daily SBT if/when meets criteria Trach tube and its implications discussed with pt's daughter   CARDIOVASCULAR A:  NSTEMI PAF, NSR presently NSVT P:  MAP goal > 65 mmHg Cont ASA Cont amiodarone - dose adjusted  RENAL A:   AKI, nonoliguric Very poor candidate for HD P:   Monitor BMET intermittently Monitor I/Os Correct electrolytes as indicated  GASTROINTESTINAL A:   No issues P:   SUP: enteral PPI Cont TFs  HEMATOLOGIC A:   ICU acquired anemia without acute blood loss P:  DVT px: SQ heparin Monitor CBC intermittently Transfuse per usual guidelines   INFECTIOUS A:   Severe sepsis LLE gangrene Aspiration PNA P:   Monitor temp, WBC count Micro and abx as above  ENDOCRINE A:   DM 2 P:   Continue Lantus Cont SSI  NEUROLOGIC A:   Multifocal CVAs, acute Acute encewphalopathy H/O seizures - not on chronic AEDs PTA P:   RASS goal: -1, -2 Transition of propofol 02/25 Try to manage with intermittent fentanyl and midaz   FAMILY  Daughter updated @ bedside. I discussed alternatives to trach/PEG   CCM time: 40 mins The above time includes time spent in consultation with patient and/or family members and reviewing care plan on multidisciplinary rounds  Merton Border, MD PCCM service Mobile [phone] Pager [phone]     12/20/2015, 11:11 AM

## 2015-12-20 NOTE — Consult Note (Signed)
GI Inpatient Consult Note  Reason for Consult: unable to swallow,  Vent dependent   Attending Requesting Consult:  Dr Alva Garnet  History of Present Illness: Stephen Camacho is a 70 y.o. male with compl past mhx, this admission with bka and now unable to wean off the vent. GI consulted for possible PEG tube.  Pt is sedated and intubated, unable to give hx.  No family at bedside.  Per nursing staff, develops agitated with increase WOB when weaned off sedating meds, requiring re-seation and inability to wean off vent.   No rectal bleeding, melena, f/c.   Past Medical History:  Past Medical History  Diagnosis Date  . Hyperlipidemia   . Hypertension   . Diabetes mellitus ([place])   . CAD (coronary artery disease)     a. s/p 2 v CABG in 2012 (LIMA-LAD & SVG-OM); b. cath 07/2012 s/p PCI/DES to ostial LCx and OM  . PAD (peripheral artery disease) ([place])     a. s/p right SFA stent; b. s/p right toe amputation 2011; c. s/p right AKA spring 2016  . Seizures ([place])   . Gangrene of foot ([place])   . Wheezing   . Lung cancer ([place])   . Prostate cancer ([place])   . Stroke [facility])     Problem List: Patient Active Problem List   Diagnosis Date Noted  . Aspiration into respiratory tract   . Encounter for intubation   . Urinary retention   . Altered mental status   . Hematoma of neck   . Occlusion of arterial bypass graft (HCC)   . NSTEMI (non-ST elevated myocardial infarction) ([place])   . Severe sepsis ([place])   . Acute respiratory failure ([place])   . Ischemia of lower extremity   . Ischemic leg 12/05/2015  . Chest pain 06/24/2015  . Type II diabetes mellitus with peripheral artery disease ([place]) 08/19/2014  . Gangrene ([place]) 07/22/2014  . PVD (peripheral vascular disease) ([place]) 07/21/2014  . PAD (peripheral artery disease) ([place]) 07/21/2014  . S/P AKA (above knee amputation) ([place]) 07/21/2014  . CAD (coronary artery disease) 07/21/2014  . Dyslipidemia 07/21/2014  . Essential hypertension, benign 07/21/2014    Past Surgical History: Past Surgical History  Procedure Laterality Date  . Right aka  07-10-2014  . Left femoral popliteal bypass    . Right lung lobeectomy    . Coronary artery bypass graft    . Peripheral vascular catheterization Left 12/06/2015    Procedure: Lower Extremity Angiography;  Surgeon: [name], MD;  Location: [place] [facility];  Service: Cardiovascular;  Laterality: Left;  . Peripheral vascular catheterization Left 12/06/2015    Procedure: Lower Extremity Intervention;  Surgeon: [name], MD;  Location: [place] [facility];  Service: Cardiovascular;  Laterality: Left;  . Peripheral vascular catheterization N/A 12/05/2015    Procedure: Lower Extremity Angiography;  Surgeon: [name], MD;  Location: [place] [facility];  Service: Cardiovascular;  Laterality: N/A;  . Peripheral vascular catheterization  12/05/2015    Procedure: Lower Extremity Intervention;  Surgeon: [name], MD;  Location: [place] [facility];  Service: Cardiovascular;;  . Amputation Left 12/12/2015    Procedure: AMPUTATION ABOVE KNEE;  Surgeon: [name], MD;  Location: [facility] ORS;  Service: Vascular;  Laterality: Left;    Allergies: No Known Allergies  Home Medications: Prescriptions prior to admission  Medication Sig Dispense Refill Last Dose  . acetaminophen (TYLENOL) 650 MG CR tablet Take 650 mg by mouth every 8 (eight)  hours as needed for pain.   PRN  . aspirin 81 MG chewable tablet Chew 81 mg by mouth daily.   Unknown  . atorvastatin (LIPITOR) 40 MG tablet Take 40 mg by mouth daily at 6 PM.   Unknown  . cilostazol (PLETAL) 50 MG tablet Take 50 mg by mouth 2 (two) times daily.   Unknown  . insulin glargine (LANTUS) 100 UNIT/ML injection Inject 10 Units into the skin at bedtime.   Unknown  . isosorbide mononitrate (IMDUR) 60 MG 24 hr tablet Take 60 mg by mouth daily.   Unknown  . lisinopril (PRINIVIL,ZESTRIL) 10 MG tablet Take 10 mg by mouth daily.   Unknown  .  meclizine (ANTIVERT) 25 MG tablet Take 1 tablet (25 mg total) by mouth 3 (three) times daily as needed for dizziness. 30 tablet 0 PRN  . metFORMIN (GLUCOPHAGE) 500 MG tablet Take by mouth 2 (two) times daily with a meal.   Unknown  . metoprolol (LOPRESSOR) 50 MG tablet Take 50 mg by mouth 2 (two) times daily.   Unknown  . nitroGLYCERIN (NITROSTAT) 0.4 MG SL tablet Place 0.4 mg under the tongue every 5 (five) minutes as needed for chest pain.   PRN  . ondansetron (ZOFRAN) 4 MG tablet Take 1 tablet (4 mg total) by mouth every 8 (eight) hours as needed for nausea or vomiting. 21 tablet 0 PRN  . rivaroxaban (XARELTO) 20 MG TABS tablet Take 20 mg by mouth daily with supper.   Unknown  . traMADol (ULTRAM) 50 MG tablet Take 25-50 mg by mouth every 6 (six) hours as needed for severe pain.    PRN   Home medication reconciliation was completed with the patient.   Scheduled Inpatient Medications:   . amiodarone  200 mg Per Tube Daily  . antiseptic oral rinse  7 mL Mouth Rinse QID  . aspirin  162 mg Per Tube Daily  . chlorhexidine gluconate  15 mL Mouth Rinse BID  . free water  200 mL Per Tube 3 times per day  . heparin subcutaneous  5,000 Units Subcutaneous Q12H  . insulin aspart  0-20 Units Subcutaneous 6 times per day  . insulin glargine  18 Units Subcutaneous Daily  . pantoprazole sodium  40 mg Per Tube Daily  . phosphorus  500 mg Per Tube BID  . piperacillin-tazobactam (ZOSYN)  IV  3.375 g Intravenous 3 times per day    Continuous Inpatient Infusions:   . feeding supplement (VITAL HIGH PROTEIN) 1,000 mL (12/20/15 1225)    PRN Inpatient Medications:  acetaminophen **OR** [DISCONTINUED] acetaminophen, bisacodyl, fentaNYL (SUBLIMAZE) injection, fentaNYL (SUBLIMAZE) injection, ipratropium-albuterol, midazolam, midazolam, ondansetron **OR** ondansetron (ZOFRAN) IV, ondansetron (ZOFRAN) IV, polyethylene glycol, sennosides  Family History: Family history is unknown by patient.    Social  History:   reports that he has never smoked. He does not have any smokeless tobacco history on file. He reports that he does not drink alcohol.   Review of Systems: Cannot obtain, sedated   Physical Examination: BP 135/79 mmHg  Pulse 110  Temp(Src) 99.3 F (37.4 C) (Axillary)  Resp 33  Ht '6\' 4"'$  (1.93 m)  Wt 64.6 kg (142 lb 6.7 oz)  BMI 17.34 kg/m2  SpO2 100% Gen: NAD, alert and oriented x 0, on vent Neck: supple, no JVD or thyromegaly Chest: coarse bilat, poor air entry bilat, no w/c.  CV: RRR, no m/g/c/r Abd: soft, NT, ND, +BS in all four quadrants; no HSM, guarding, ridigity, or rebound tenderness Ext: no  edema, well perfused with 2+ pulses, Skin: no rash or lesions noted Lymph: no LAD  Data: Lab Results  Component Value Date   WBC 21.9* 12/20/2015   HGB 8.1* 12/20/2015   HCT 25.8* 12/20/2015   MCV 87.3 12/20/2015   PLT 234 12/20/2015    Recent Labs Lab 12/17/15 0421 12/19/15 0600 12/20/15 0542  HGB 8.4* 7.8* 8.1*   Lab Results  Component Value Date   NA 140 12/20/2015   K 3.8 12/20/2015   CL 113* 12/20/2015   CO2 22 12/20/2015   BUN 32* 12/20/2015   CREATININE 1.32* 12/20/2015   Lab Results  Component Value Date   ALT 11* 12/05/2015   AST 17 12/05/2015   ALKPHOS 83 12/05/2015   BILITOT 0.4 12/05/2015   No results for input(s): APTT, INR, PTT in the last 168 hours.   Assessment/Plan: Stephen Camacho is a 70 y.o. male with complicated hospitalization c/b BKA and now with failure to wean off vent.   Recommendations:  - possible PEG next week, pending family discussions and if going ahead with trach.   Thank you for the consult. Please call with questions or concerns.  Chantille Navarrete, Grace Blight, MD

## 2015-12-21 ENCOUNTER — Inpatient Hospital Stay: Payer: PPO

## 2015-12-21 DIAGNOSIS — L8990 Pressure ulcer of unspecified site, unspecified stage: Secondary | ICD-10-CM | POA: Insufficient documentation

## 2015-12-21 LAB — CBC
HEMATOCRIT: 27.2 % — AB (ref 40.0–52.0)
HEMOGLOBIN: 8.4 g/dL — AB (ref 13.0–18.0)
MCH: 27 pg (ref 26.0–34.0)
MCHC: 31 g/dL — ABNORMAL LOW (ref 32.0–36.0)
MCV: 87.1 fL (ref 80.0–100.0)
Platelets: 309 10*3/uL (ref 150–440)
RBC: 3.12 MIL/uL — ABNORMAL LOW (ref 4.40–5.90)
RDW: 17 % — AB (ref 11.5–14.5)
WBC: 20.3 10*3/uL — AB (ref 3.8–10.6)

## 2015-12-21 LAB — GLUCOSE, CAPILLARY
GLUCOSE-CAPILLARY: 198 mg/dL — AB (ref 65–99)
GLUCOSE-CAPILLARY: 235 mg/dL — AB (ref 65–99)
GLUCOSE-CAPILLARY: 296 mg/dL — AB (ref 65–99)
Glucose-Capillary: 274 mg/dL — ABNORMAL HIGH (ref 65–99)
Glucose-Capillary: 254 mg/dL — ABNORMAL HIGH (ref 65–99)
Glucose-Capillary: 203 mg/dL — ABNORMAL HIGH (ref 65–99)

## 2015-12-21 LAB — TROPONIN I: TROPONIN I: 0.28 ng/mL — AB (ref <0.031)

## 2015-12-21 LAB — BASIC METABOLIC PANEL
Anion gap: 5 (ref 5–15)
BUN: 36 mg/dL — AB (ref 6–20)
CALCIUM: 7.2 mg/dL — AB (ref 8.9–10.3)
CO2: 24 mmol/L (ref 22–32)
Chloride: 116 mmol/L — ABNORMAL HIGH (ref 101–111)
Creatinine, Ser: 1.35 mg/dL — ABNORMAL HIGH (ref 0.61–1.24)
GFR calc Af Amer: 60 mL/min — ABNORMAL LOW (ref >60)
GFR, EST NON AFRICAN AMERICAN: 52 mL/min — AB (ref >60)
GLUCOSE: 285 mg/dL — AB (ref 65–99)
POTASSIUM: 3.9 mmol/L (ref 3.5–5.1)
Sodium: 145 mmol/L (ref 135–145)

## 2015-12-21 LAB — CULTURE, BLOOD (ROUTINE X 2)
Culture: NO GROWTH
Culture: NO GROWTH

## 2015-12-21 LAB — PHOSPHORUS: PHOSPHORUS: 2.4 mg/dL — AB (ref 2.5–4.6)

## 2015-12-21 LAB — CULTURE, RESPIRATORY

## 2015-12-21 LAB — CULTURE, RESPIRATORY W GRAM STAIN

## 2015-12-21 MED ORDER — INSULIN GLARGINE 100 UNIT/ML ~~LOC~~ SOLN
25.0000 [IU] | Freq: Every day | SUBCUTANEOUS | Status: DC
Start: 2015-12-21 — End: 2015-12-21

## 2015-12-21 MED ORDER — INSULIN ASPART 100 UNIT/ML ~~LOC~~ SOLN
0.0000 [IU] | SUBCUTANEOUS | Status: DC
  Administered 2015-12-21: 7 [IU] via SUBCUTANEOUS
  Administered 2015-12-21 (×2): 11 [IU] via SUBCUTANEOUS
  Administered 2015-12-21: 7 [IU] via SUBCUTANEOUS
  Administered 2015-12-22: 4 [IU] via SUBCUTANEOUS
  Administered 2015-12-22 (×2): 11 [IU] via SUBCUTANEOUS
  Filled 2015-12-21 (×4): qty 11
  Filled 2015-12-21 (×2): qty 7

## 2015-12-21 MED ORDER — VANCOMYCIN HCL IN DEXTROSE 750-5 MG/150ML-% IV SOLN
750.0000 mg | INTRAVENOUS | Status: DC
  Administered 2015-12-21 – 2015-12-23 (×3): 750 mg via INTRAVENOUS
  Filled 2015-12-21 (×4): qty 150

## 2015-12-21 MED ORDER — INSULIN GLARGINE 100 UNIT/ML ~~LOC~~ SOLN
25.0000 [IU] | Freq: Every day | SUBCUTANEOUS | Status: DC
  Administered 2015-12-21: 25 [IU] via SUBCUTANEOUS
  Filled 2015-12-21 (×2): qty 0.25

## 2015-12-21 MED ORDER — SODIUM CHLORIDE 0.9 % IV SOLN
INTRAVENOUS | Status: DC
  Filled 2015-12-21: qty 2.5

## 2015-12-21 MED ORDER — FREE WATER
200.0000 mL | Status: DC
  Administered 2015-12-21 – 2015-12-23 (×15): 200 mL

## 2015-12-21 MED ORDER — CHLORHEXIDINE GLUCONATE 0.12% ORAL RINSE (MEDLINE KIT)
15.0000 mL | Freq: Two times a day (BID) | OROMUCOSAL | Status: DC
  Administered 2015-12-21 – 2016-01-07 (×34): 15 mL via OROMUCOSAL
  Filled 2015-12-21 (×37): qty 15

## 2015-12-21 MED ORDER — IBUPROFEN 100 MG/5ML PO SUSP
400.0000 mg | Freq: Four times a day (QID) | ORAL | Status: DC | PRN
  Administered 2015-12-21: 400 mg
  Filled 2015-12-21 (×2): qty 20

## 2015-12-21 MED ORDER — ANTISEPTIC ORAL RINSE SOLUTION (CORINZ)
7.0000 mL | OROMUCOSAL | Status: DC
  Administered 2015-12-21 – 2016-01-07 (×165): 7 mL via OROMUCOSAL
  Filled 2015-12-21 (×179): qty 7

## 2015-12-21 MED ORDER — VANCOMYCIN HCL IN DEXTROSE 750-5 MG/150ML-% IV SOLN
750.0000 mg | INTRAVENOUS | Status: AC
  Administered 2015-12-21: 750 mg via INTRAVENOUS
  Filled 2015-12-21: qty 150

## 2015-12-21 NOTE — Progress Notes (Signed)
PULMONARY / CRITICAL CARE MEDICINE   Name: Stephen Camacho MRN: [id number] DOB: 1945/11/27    ADMISSION DATE:  12/05/2015 CONSULTATION DATE:  12/08/15  History of Present Illness:   70 YO male with 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 Now with acute respiratory failure, intubated due to pneumonia. Underwent a left AKA   SUBJECTIVE:  Sedated on the vent. Febrile and agitated overnight. Fentanyl gtte and versed started; much calm this morning. Fever of 103 degrees F on zosyn. Increased perihilar opacities on today's cxr.    EVENTS/DATA: 02/10 >> LLE angiogram, thrombolysis, thrombectomy, angioplasty 02/11 Intubated for suspected PNA 02/11 elevated trop I. Pk trop I 62.6 on 02/20 02/12 CT head: New nonhemorrhagic infarcts involving the right PCA territory and bilateral cerebellum, left greater than right 02/17 L AKA 02/18>extubated 02/21 TTE: LVEF 55-60% 02/21 RUE [place]: no DVT 02/21>Re-intubated 02/22 >CT head: Interval evolution of bilateral cerebellar and right PCA territory infarcts  INDWELLING DEVICES:: ETT 02/11 >> 02/18, 02/21 >>  R IJ CVL 02/10 >>   MICRO DATA: MRSA PCR 02/13 >> NEG Urine 02/12 >> NEG Resp 02/11 >> MSSA C diff 02/16 >> NEG Blood 02/12 >> NEG Urine 02/19 >> NEG C diff 02/21 >> NEG Cath tip 02/19 >> NEG Resp 02/18 >> light growth candida Urine 02/21 >> NEG MRSA PCR 02/22 >> NEG Respiratory 02/26>>  ANTIMICROBIALS:  Vanc 02/11 >> 02/23 Pip-tazo 02/11 >> 02/20 Ceftaz 02/21 >> 02/24 Metronidazole 02/21 >> 02/24 Pip-tazo 02/24 >>  Vancomycin 02/25>>   VITAL SIGNS: BP 121/65 mmHg  Pulse 111  Temp(Src) 103.5 F (39.7 C) (Rectal)  Resp 29  Ht '6\' 4"'$  (1.93 m)  Wt 134 lb 14.7 oz (61.2 kg)  BMI 16.43 kg/m2  SpO2 100%  HEMODYNAMICS:     VENTILATOR SETTINGS: Vent Mode:  [-] PSV FiO2 (%):  [35 %] 35 % Set Rate:  [15 bmp] 15 bmp Vt Set:  [500 mL] 500 mL PEEP:  [5 cmH20] 5 cmH20 Pressure Support:  [25 cmH20] 25 cmH20 Plateau Pressure:  [16 cmH20] 16 cmH20  INTAKE / OUTPUT: I/O last 3 completed shifts: In: 3798.2 [I.V.:1303.6; NG/GT:2194.6; IV Piggyback:300] Out: 5000 [Urine:5000]  PHYSICAL EXAMINATION: General: Chronically ill appearing; NAD Neuro: CNs intact, MAEs, agitated on WUA HEENT: NCAT, ETT in place Cardiovascular: RRR, S1/S2, no MRG Lungs: Bilateral airflow, no wheezes Abdomen: Soft, NT, +BS Ext: B AKA, L surgical wound clean and dry  LABS:  BMET  Recent Labs Lab 12/19/15 0600 12/19/15 1715 12/20/15 0542 12/21/15 0419  NA 148*  --  140 145  K 3.1* 3.8 3.8 3.9  CL 118*  --  113* 116*  CO2 24  --  22 24  BUN 35*  --  32* 36*  CREATININE 1.35*  --  1.32* 1.35*  GLUCOSE 166*  --  186* 285*    Electrolytes  Recent Labs Lab 12/18/15 0526 12/18/15 1310 12/19/15 0600 12/20/15 0542 12/21/15 0419  CALCIUM 7.0*  --  6.8* 6.8* 7.2*  MG 2.0 1.9  --  1.8  --   PHOS 2.8  --   --  2.3* 2.4*    CBC  Recent Labs Lab 12/19/15 0600 12/20/15 0542 12/21/15 0419  WBC 28.9* 21.9* 20.3*  HGB 7.8* 8.1* 8.4*  HCT 24.9* 25.8* 27.2*  PLT 195 234 309    Coag's No results for input(s):  APTT, INR in the last 168 hours.  Sepsis Markers  Recent Labs Lab 12/16/15 0519 12/17/15 0421 12/18/15 0526  LATICACIDVEN 2.5*  --   --   PROCALCITON 3.52 4.32 1.84    ABG  Recent Labs Lab 12/16/15 1430 12/17/15 0445 12/18/15 0500  PHART 7.48* 7.52* 7.39  PCO2ART 36 34 45  PO2ART 83 58* 81*    Liver Enzymes No results for input(s): AST, ALT, ALKPHOS, BILITOT, ALBUMIN in the last 168 hours.  Cardiac Enzymes  Recent Labs Lab 12/16/15 0440  TROPONINI 2.70*    Glucose  Recent Labs Lab 12/20/15 1554 12/20/15 1949 12/21/15 0008 12/21/15 0407 12/21/15 0740 12/21/15 1121  GLUCAP  184* 212* 198* 296* 235* 274*   Dg Chest Port 1 View  12/21/2015  CLINICAL DATA:  70 year old male with respiratory failure. EXAM: PORTABLE CHEST 1 VIEW COMPARISON:  12/20/2015 FINDINGS: An endotracheal tube with tip 5.6 cm above carina, NG tube entering the stomach with tip off field of view, left subclavian central venous catheter with tip overlying the superior cavoatrial junction again noted. Bilateral airspace opacities/ edema have slightly increased. Cardiomegaly, CABG changes and bilateral upper lung surgical changes again identified. Left retrocardiac density has slightly increased. There is no evidence of pneumothorax or large pleural effusion. IMPRESSION: Slight increased bilateral airspace opacities/ edema. Slightly increased left retrocardiac opacity/atelectasis. No other significant changes. Electronically Signed   By: Margarette Canada M.D.   On: 12/21/2015 07:30   CXR 02/25: patchy B interstitial and alveolar infiltrates  ASSESSMENT / PLAN:  PULMONARY A: Prolonged VDRF Failed extubation attempt Bilateral pulmonary infiltrates and fever- suspected PNA P:   Cont vent support - settings reviewed and/or adjusted Wean in PSV as tolerated Cont vent bundle Daily SBT if/when meets criteria Antibiotics as above  CXR in am Repeat respiratory cultures  Trach tube and its implications discussed with pt's daughter   CARDIOVASCULAR A:  NSTEMI PAF, NSR presently NSVT P:  MAP goal > 65 mmHg Cont ASA Continue amiodarone  Hemodynamics per ICU protocol  RENAL A:   AKI, nonoliguric, creatinine stable at 1.35 Very poor candidate for HD P:   Monitor BMET intermittently Monitor I/Os Correct electrolytes as indicated Increase free water flushes  GASTROINTESTINAL A:   No issues P:   SUP: enteral PPI Cont TFs as tolerated  HEMATOLOGIC A:   ICU acquired anemia without acute blood loss P:  DVT px: SQ heparin Monitor CBC intermittently Transfuse per usual  guidelines   INFECTIOUS A:   Severe sepsis-persistent leukocytosis and fever LLE gangrene Aspiration PNA P:   -Vanco and zosyn -Repeat respirtory cultures and f/u pending culture -Cooling blanket, tylenol and motrin prn for fever >103  ENDOCRINE A:   Type 2 DM-Persistent hyperglycemia P:   Increase lantus to 25 units daily and change sliding scale to resistant scale Blood gluocose monitoring per ICU protocol  NEUROLOGIC A:   Multifocal CVAs, acute Acute encewphalopathy H/O seizures - not on chronic AEDs PTA P:   RASS goal: -1, -2 Fentanyl gtt and prn versed for vent sedation   FAMILY  No family at bedside during morning rounds. Plan is to meet with son and daughter together and discuss alternatives to peg/trach as well as prognosis and overall goals of care.   CCM time: 42 mins  Magdalene S. [facility] ANP-BC Pulmonary and [place] Pager [phone]    12/21/2015, 12:54 PM   PCCM ATTENDING ATTESTATION:  I have evaluated patient with ANP Patria Mane, reviewed database in its  entirety and discussed care plan in detail. In addition, this patient was discussed on multidisciplinary rounds.   Important exam findings: High fever Intubated, RASS -3, -4 Tachycardic and tachypneic CXR reveals slightly increased opacities  Major problems addressed by PCCM team: Prolonged VDRF CVA HCAP New fever S/P B AKA Poor prognosis for functional recovery   PLAN/REC: As outlined above. Add Vanc. Reculture resp tract. I continue to believe that trach/PEG are not appropriate in this case as there is no realistic hope for functional recovery. Unfortunately, I was not able to again meet with pt's daughter or son    Merton Border, MD PCCM service Mobile [phone] Pager [phone]

## 2015-12-21 NOTE — Plan of Care (Signed)
Problem: Safety: Goal: Ability to remain free from injury will improve Outcome: Progressing Pt on Q2h oral care, Q2 hour turns, ET tube repositioned Q 4 hours.  Problem: Pain Managment: Goal: General experience of comfort will improve Outcome: Progressing Pt on Fentanyl gtt at 75 mcg. Pt currently nonlabored on the vent., HR is 85, and is resting with eyes closed.  Problem: Skin Integrity: Goal: Risk for impaired skin integrity will decrease Outcome: Progressing Sacral ulcer granulating. Foam changed.   Problem: Phase I Progression Outcomes Goal: GIProphysixis Outcome: Completed/Met Date Met:  12/21/15 Pt on liquid protonix via tube Goal: Oral Care per Protocol Outcome: Completed/Met Date Met:  12/21/15 Q2h oral care Goal: Pain controlled with appropriate interventions Outcome: Progressing Pt on Fentanyl gtt at 75 mcg Goal: Hemodynamically stable Outcome: Progressing Pt spike a fever of 104.2 today. Tylenol and Motrin given and cooling blanket applied. Current temp is 99.5.

## 2015-12-21 NOTE — Progress Notes (Signed)
Spoke with Dr. Alva Garnet in regards to pt's temperature now up to 104. Orders received for Motrin and PRN cooling blanket if Motrin does not bring temperature down.

## 2015-12-21 NOTE — Progress Notes (Signed)
Notified Dr. Alva Garnet at bedside approximately 3 pm of pt's ST segment depressed and T wave inverted. This was also reported a change from Morning telemetry strip. Dr. Alva Garnet requested an EKG-12 lead.  12 lead preformed. Paged on call doctor for CCM. E-link doctor called and results reported. Stated he will review the EKG and would add a troponin on to AM labs from today.

## 2015-12-21 NOTE — Progress Notes (Signed)
[facility]  Daily Progress Note   Subjective: 9 Days Post-Op: Left above-the-knee amputation.  Patient sedated and intubated - unable to respond to questions. No acute distress. Attempts to wean from vent unsuccessful as patient becomes agitated. Febrile - being followed by Infectious Disease. Trach and PEG possible next week pending family decision.   Objective: Filed Vitals:   12/21/15 0900 12/21/15 0920 12/21/15 0929 12/21/15 1000  BP: 134/75   124/74  Pulse: 121 131 125 125  Temp:  103.6 F (39.8 C) 104.2 F (40.1 C) 104.2 F (40.1 C)  TempSrc:  Rectal    Resp: 34 37 31 30  Height:      Weight:      SpO2: 99% 55% 99% 100%    Intake/Output Summary (Last 24 hours) at 12/21/15 1056 Last data filed at 12/21/15 0939  Gross per 24 hour  Intake 2012.91 ml  Output   3750 ml  Net -1737.09 ml   Physical Exam: Sedated on vent. Unable to answer questions.  Neck: Right IJ - no signs of infection noted. CV: Tachy to 120's Pulmonary: On Vent. Course breath sounds. Left PICC line: Intact, no signs of infection Abdomen: Soft, Non-tender, Non-distended. Rectal Tube in place. Oral Feeding Tube: In place GU: Foley in place Vascular: Left Lower Extremity: Staples clean , dry and intact. No drainage. Stump edematous but healthy.   Laboratory: CBC    Component Value Date/Time   WBC 20.3* 12/21/2015 0419   WBC 4.9 10/25/2014 1331   HGB 8.4* 12/21/2015 0419   HGB 12.1* 10/25/2014 1331   HCT 27.2* 12/21/2015 0419   HCT 38.1* 10/25/2014 1331   PLT 309 12/21/2015 0419   PLT 151 10/25/2014 1331   BMET    Component Value Date/Time   NA 145 12/21/2015 0419   NA 138 10/25/2014 1331   K 3.9 12/21/2015 0419   K 3.9 10/25/2014 1331   CL 116* 12/21/2015 0419   CL 109* 10/25/2014 1331   CO2 24 12/21/2015 0419   CO2 21 10/25/2014 1331   GLUCOSE 285* 12/21/2015 0419   GLUCOSE 232* 10/25/2014 1331   BUN 36* 12/21/2015 0419   BUN 17 10/25/2014 1331   CREATININE 1.35* 12/21/2015 0419   CREATININE 1.11 10/25/2014 1331   CALCIUM 7.2* 12/21/2015 0419   CALCIUM 8.7 10/25/2014 1331   GFRNONAA 52* 12/21/2015 0419   GFRNONAA >60 10/25/2014 1331   GFRNONAA 52* 07/08/2014 1051   GFRAA 60* 12/21/2015 0419   GFRAA >60 10/25/2014 1331   GFRAA >60 07/08/2014 1051   Assessment/Planning: 70 year old male s/p 9 Days Post-Op: left above-the-knee amputation transferred back to ICU for respiratory distress, now intubated. 1) Attempts to wean from vent unsuccessful as patient becomes agitated.  2) Febrile with elevated WBC count - being followed by Infectious Disease.  3)Trach and PEG possible next week pending family decision.  4) Stump healing well  Marcelle Overlie PA-C 12/21/2015 10:56 AM

## 2015-12-21 NOTE — Progress Notes (Signed)
Pts. tachypneic on ventilator. RR were high 30's and staying around 38 BPM even with Fentanyl and Versed. Notified e-link.

## 2015-12-21 NOTE — Progress Notes (Addendum)
Pharmacy Antibiotic Note  Stephen Camacho is a 70 y.o. male admitted on 12/05/2015 with pneumonia.  Pharmacy has been consulted for vancomycin dosing. Patient is currently ordered Zosyn 3.375 g EI q 8 hours.   Plan: Vancomycin 750 IV every 18 hours with stacked dosing and a trough with the 5th total dose. Goal trough 15-20 mcg/mL.  Continue Zosyn 3.375 g EI q 8 hours.   Pharmacy will continue to monitor and adjust per consult.   Height: '6\' 4"'$  (193 cm) Weight: 134 lb 14.7 oz (61.2 kg) IBW/kg (Calculated) : 86.8  Temp (24hrs), Avg:101.4 F (38.6 C), Min:99.3 F (37.4 C), Max:104.2 F (40.1 C)   Recent Labs Lab 12/16/15 0440 12/16/15 0519 12/17/15 0421 12/18/15 0526 12/19/15 0600 12/20/15 0542 12/21/15 0419  WBC 28.5*  --  36.9*  --  28.9* 21.9* 20.3*  CREATININE 2.42*  --  2.40* 1.91* 1.35* 1.32* 1.35*  LATICACIDVEN  --  2.5*  --   --   --   --   --     Estimated Creatinine Clearance: 44.1 mL/min (by C-G formula based on Cr of 1.35).    No Known Allergies  Antimicrobials this admission: vancomycin 02/22 >> 02/23 ceftazidime 02/22 >> 02/24 Metronidazole 02/23 >> 02/24 Zosyn 02/24 >> Vancomycin 02/26 >>  Dose adjustments this admission:   Microbiology results: 02/23 BCx: NGTD x 2 02/23 UCx: negative  02/21 Sputum: Candida albicans  02/22 MRSA PCR: negative 02/26 TA pending  Thank you for allowing pharmacy to be a part of this patient's care.  Napoleon Form 12/21/2015 9:37 AM

## 2015-12-21 NOTE — Progress Notes (Signed)
[place] Progress Note Patient Name: Stephen Camacho DOB: [birth year] MRN: [id number]   Date of Service  12/21/2015  HPI/Events of Note  Nursing reports change in EKG. EKG reviewed and shows T wave inversion in precordial leads. NSTEMI during current admission.  eICU Interventions  Will monitor closely. Check troponin.      Intervention Category Minor Interventions: Clinical assessment - ordering diagnostic tests  Dimas Chyle 12/21/2015, 4:04 PM

## 2015-12-21 NOTE — Progress Notes (Signed)
CRITICAL VALUE ALERT  Critical value received:  Troponin 0.28  Date of notification:  12/21/2015  Time of notification:  6728  Critical value read back:Yes.    Nurse who received alert:  Kathi Simpers, RN  MD notified (1st page):  Dr. Lyndel Safe  Time of first page:  32  MD notified (2nd page):  Time of second page:  Responding MD:  Dr. Lyndel Safe  Time MD responded:  [place] to report Troponin, no new orders received.

## 2015-12-22 ENCOUNTER — Inpatient Hospital Stay: Payer: PPO

## 2015-12-22 LAB — BLOOD GAS, ARTERIAL
ALLENS TEST (PASS/FAIL): POSITIVE — AB
Acid-Base Excess: 0 mmol/L (ref 0.0–3.0)
Bicarbonate: 24.8 mEq/L (ref 21.0–28.0)
FIO2: 30
LHR: 15 {breaths}/min
MECHVT: 500 mL
O2 SAT: 93 %
PATIENT TEMPERATURE: 37
PCO2 ART: 40 mmHg (ref 32.0–48.0)
PEEP: 5 cmH2O
PO2 ART: 67 mmHg — AB (ref 83.0–108.0)
pH, Arterial: 7.4 (ref 7.350–7.450)

## 2015-12-22 LAB — GLUCOSE, CAPILLARY
GLUCOSE-CAPILLARY: 254 mg/dL — AB (ref 65–99)
GLUCOSE-CAPILLARY: 277 mg/dL — AB (ref 65–99)
GLUCOSE-CAPILLARY: 224 mg/dL — AB (ref 65–99)
GLUCOSE-CAPILLARY: 252 mg/dL — AB (ref 65–99)
GLUCOSE-CAPILLARY: 254 mg/dL — AB (ref 65–99)
GLUCOSE-CAPILLARY: 204 mg/dL — AB (ref 65–99)
GLUCOSE-CAPILLARY: 199 mg/dL — AB (ref 65–99)
GLUCOSE-CAPILLARY: 141 mg/dL — AB (ref 65–99)
Glucose-Capillary: 123 mg/dL — ABNORMAL HIGH (ref 65–99)
Glucose-Capillary: 131 mg/dL — ABNORMAL HIGH (ref 65–99)
Glucose-Capillary: 133 mg/dL — ABNORMAL HIGH (ref 65–99)
Glucose-Capillary: 141 mg/dL — ABNORMAL HIGH (ref 65–99)
Glucose-Capillary: 141 mg/dL — ABNORMAL HIGH (ref 65–99)
Glucose-Capillary: 159 mg/dL — ABNORMAL HIGH (ref 65–99)
Glucose-Capillary: 199 mg/dL — ABNORMAL HIGH (ref 65–99)
Glucose-Capillary: 259 mg/dL — ABNORMAL HIGH (ref 65–99)
Glucose-Capillary: 270 mg/dL — ABNORMAL HIGH (ref 65–99)

## 2015-12-22 LAB — BASIC METABOLIC PANEL
Anion gap: 9 (ref 5–15)
BUN: 37 mg/dL — AB (ref 6–20)
CHLORIDE: 114 mmol/L — AB (ref 101–111)
CO2: 23 mmol/L (ref 22–32)
CREATININE: 1.34 mg/dL — AB (ref 0.61–1.24)
Calcium: 7.4 mg/dL — ABNORMAL LOW (ref 8.9–10.3)
GFR calc Af Amer: >60 mL/min (ref >60)
GFR calc non Af Amer: 52 mL/min — ABNORMAL LOW (ref >60)
GLUCOSE: 281 mg/dL — AB (ref 65–99)
Potassium: 3.2 mmol/L — ABNORMAL LOW (ref 3.5–5.1)
Sodium: 146 mmol/L — ABNORMAL HIGH (ref 135–145)

## 2015-12-22 LAB — CBC
HCT: 27.8 % — ABNORMAL LOW (ref 40.0–52.0)
Hemoglobin: 8.8 g/dL — ABNORMAL LOW (ref 13.0–18.0)
MCH: 27.6 pg (ref 26.0–34.0)
MCHC: 31.6 g/dL — ABNORMAL LOW (ref 32.0–36.0)
MCV: 87.3 fL (ref 80.0–100.0)
Platelets: 348 K/uL (ref 150–440)
RBC: 3.18 MIL/uL — ABNORMAL LOW (ref 4.40–5.90)
RDW: 16.1 % — ABNORMAL HIGH (ref 11.5–14.5)
WBC: 22.6 K/uL — ABNORMAL HIGH (ref 3.8–10.6)

## 2015-12-22 LAB — PHOSPHORUS
PHOSPHORUS: 3.5 mg/dL (ref 2.5–4.6)
Phosphorus: 2.4 mg/dL — ABNORMAL LOW (ref 2.5–4.6)

## 2015-12-22 LAB — MAGNESIUM: Magnesium: 2.2 mg/dL (ref 1.7–2.4)

## 2015-12-22 LAB — POTASSIUM: POTASSIUM: 3.6 mmol/L (ref 3.5–5.1)

## 2015-12-22 MED ORDER — POTASSIUM CHLORIDE 10 MEQ/50ML IV SOLN
10.0000 meq | INTRAVENOUS | Status: AC
Start: 2015-12-22 — End: 2015-12-22
  Administered 2015-12-22 (×3): 10 meq via INTRAVENOUS
  Filled 2015-12-22 (×4): qty 50

## 2015-12-22 MED ORDER — SODIUM CHLORIDE 0.9 % IV SOLN
100.0000 mg | INTRAVENOUS | Status: DC
  Administered 2015-12-23 – 2015-12-31 (×9): 100 mg via INTRAVENOUS
  Filled 2015-12-22 (×10): qty 100

## 2015-12-22 MED ORDER — DEXTROSE 5 % IV SOLN
10.0000 mmol | Freq: Once | INTRAVENOUS | Status: AC
  Administered 2015-12-22: 10 mmol via INTRAVENOUS
  Filled 2015-12-22: qty 3.33

## 2015-12-22 MED ORDER — SODIUM CHLORIDE 0.9 % IV SOLN
INTRAVENOUS | Status: DC
  Administered 2015-12-22: 11:00:00 via INTRAVENOUS
  Filled 2015-12-22 (×2): qty 2.5

## 2015-12-22 MED ORDER — SODIUM CHLORIDE 0.9 % IV SOLN
200.0000 mg | Freq: Once | INTRAVENOUS | Status: AC
  Administered 2015-12-22: 200 mg via INTRAVENOUS
  Filled 2015-12-22: qty 200

## 2015-12-22 MED ORDER — MAGNESIUM SULFATE IN D5W 10-5 MG/ML-% IV SOLN
1.0000 g | Freq: Once | INTRAVENOUS | Status: AC
  Administered 2015-12-22: 1 g via INTRAVENOUS
  Filled 2015-12-22: qty 100

## 2015-12-22 NOTE — Progress Notes (Signed)
S:  Remains intubated and minimally responsive  O:  AF VSS       Lungs rhonchi bilaterally oropharynx with ET tube present       Left leg incision CD&I  A:  S/P left AKA      S/P Acute MI with associated pulmonary and renal failure  P:  Continue supportive care       Still in discussion with family regarding PEG and trach ultimately if they do agree then he will require LTAC

## 2015-12-22 NOTE — Progress Notes (Signed)
Notified Dr. Mortimer Fries that patient on 17mqs of fentanyl this am was tachypneic at 44 and tachycardic at 120 with copious secretions- patient was awake and in distress- increased fentanyl to 100 mcqs and gave 4 mg of versed- patient resting comfortably at this time- blood sugars remain in the high 200's- patient on 25 units of lantus.  Dr. KMortimer Friesstated that he would place orders for insulin drip and also plan for a meeting tomorrow to speak with the family on the next step (trach and peg).

## 2015-12-22 NOTE — Progress Notes (Signed)
[place] Progress Note Patient Name: Stephen Camacho DOB: 07-11-1946 MRN: [id number]   Date of Service  12/22/2015  HPI/Events of Note  Pt earlier withfPVCs. K 3.2, phos 2.4  eICU Interventions  Will order K and Phos replacement. Rpt K and phos afterr replacement     Intervention Category Intermediate Interventions: Electrolyte abnormality - evaluation and management  [place] 12/22/2015, 3:10 AM

## 2015-12-22 NOTE — Progress Notes (Signed)
Pt had another 12 beat run of vtach. Pushed elink button and notified Elink RN. Dr. Larkin Ina to return call.

## 2015-12-22 NOTE — Progress Notes (Signed)
[place] Progress Note Patient Name: Stephen Camacho DOB: [birth year] MRN: [id number]   Date of Service  12/22/2015  HPI/Events of Note  RN called re: 5 beat run of vtach and another 9 beat vtach. 130/80, 110, 100% on 30%. Temp 102  eICU Interventions  Stat bmp, Mg, Phos.      Intervention Category Major Interventions: Arrhythmia - evaluation and management  [place] 12/22/2015, 12:30 AM

## 2015-12-22 NOTE — Progress Notes (Signed)
Placed pt on cooling blanket post Tylenol administration as fever is still rising.

## 2015-12-22 NOTE — Progress Notes (Signed)
Nutrition Follow-up    INTERVENTION:   EN: recommend continuing current TF regimen at present   NUTRITION DIAGNOSIS:   Inadequate oral intake related to inability to eat as evidenced by NPO status. Being addressed via TF  GOAL:   Patient will meet greater than or equal to 90% of their needs  MONITOR:    (Energy Intake, Electrolyte and renal Profile, Anthropometrics, Digestive System, Glucose Profile, Pulmonary Profile)  REASON FOR ASSESSMENT:   Ventilator, Consult Enteral/tube feeding initiation and management  ASSESSMENT:   Pt remains on vent  EN: tolerating Vital high Protein at rate of 70 ml/hr  Digestive System: flexiseal with liquid stool, no signs of TF intolerance  Electrolyte and Renal Profile:  Recent Labs Lab 12/18/15 1310  12/20/15 0542 12/21/15 0419 12/22/15 0204 12/22/15 0819 12/22/15 1036  BUN  --   < > 32* 36* 37*  --   --   CREATININE  --   < > 1.32* 1.35* 1.34*  --   --   NA 150*  < > 140 145 146*  --   --   K  --   < > 3.8 3.9 3.2*  --  3.6  MG 1.9  --  1.8  --  2.2  --   --   PHOS  --   --  2.3* 2.4* 2.4* 3.5  --   < > = values in this interval not displayed. Glucose Profile:  Recent Labs  12/22/15 1158 12/22/15 1319 12/22/15 1414  GLUCAP 252* 254* 204*   Meds: insulin drip  Height:   Ht Readings from Last 1 Encounters:  12/05/15 '6\' 4"'$  (1.93 m)    Weight:   Wt Readings from Last 1 Encounters:  12/22/15 138 lb 10.7 oz (62.9 kg)    Ideal Body Weight:     BMI:  Body mass index is 16.89 kg/(m^2).  Estimated Nutritional Needs:   Kcal:  1711 kcals (Ve: 15.6, Tmax: 38.6) using wt of 72 kg, legnth of 44 inches  Protein:  108-144 g (1.5-2.0 g/kg)   Fluid:  1800-2160 mL (25-30 ml/kg)   EDUCATION NEEDS:   No education needs identified at this time  [place], RD, LDN [phone] Pager  [phone] Weekend/On-Call Pager

## 2015-12-22 NOTE — Progress Notes (Signed)
Cooling blanket settings changed to moderately fast cooling. Pt placed on at 0111, temperature hasn't gown down at all since then. Will reassess.

## 2015-12-22 NOTE — Progress Notes (Signed)
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 Now with acute respiratory failure, intubated due to pneumonia. Underwent a left AKA   SUBJECTIVE:  Sedated on the vent. Increased WOB on vent, on Fentanyl gtt and versed started;on zosyn. Increased perihilar opacities on today's cxr.  Remains sedated and critically ill  EVENTS/DATA: 02/10 >> LLE angiogram, thrombolysis, thrombectomy, angioplasty 02/11 Intubated for suspected PNA 02/11 elevated trop I. Pk trop I 62.6 on 02/20 02/12 CT head: New nonhemorrhagic infarcts involving the right PCA territory and bilateral cerebellum, left greater than right 02/17 L AKA 02/18>extubated 02/21 TTE: LVEF 55-60% 02/21 RUE [place]: no DVT 02/21>Re-intubated 02/22 >CT head: Interval evolution of bilateral cerebellar and right PCA territory infarcts 02/27-remains intubated,discussion with family regarding plan of care pending  INDWELLING DEVICES:: ETT 02/11 >> 02/18, 02/21 >>  R IJ CVL 02/10 >>   MICRO DATA: MRSA PCR 02/13 >> NEG Urine 02/12 >> NEG Resp 02/11 >> MSSA C diff 02/16 >> NEG Blood 02/12 >> NEG Urine 02/19 >> NEG C diff 02/21 >> NEG Cath tip 02/19 >> NEG Resp 02/18 >> light growth candida Urine 02/21 >> NEG MRSA PCR 02/22 >> NEG Respiratory 02/26>>  ANTIMICROBIALS:  Vanc 02/11 >> 02/23 Pip-tazo 02/11 >> 02/20 Ceftaz 02/21 >> 02/24 Metronidazole 02/21 >> 02/24 Pip-tazo 02/24 >>  Vancomycin 02/25>>   VITAL SIGNS: BP 121/74 mmHg  Pulse 110  Temp(Src) 100.8 F (38.2 C) (Rectal)  Resp 23  Ht '6\' 4"'$  (1.93 m)  Wt 138 lb 10.7 oz (62.9  kg)  BMI 16.89 kg/m2  SpO2 100%  HEMODYNAMICS:    VENTILATOR SETTINGS: Vent Mode:  [-] PRVC FiO2 (%):  [30 %-35 %] 30 % Set Rate:  [15 bmp] 15 bmp Vt Set:  [500 mL-600 mL] 500 mL PEEP:  [5 cmH20] 5 cmH20 Pressure Support:  [20 cmH20] 20 cmH20 Plateau Pressure:  [15 cmH20] 15 cmH20  INTAKE / OUTPUT: I/O last 3 completed shifts: In: 4438.3 [I.V.:227.5; TF/TD:3220.2; IV Piggyback:653.3] Out: 4775 [Urine:4775]  PHYSICAL EXAMINATION: General: Chronically ill appearing;+resp distress Neuro: CNs intact, MAEs, agitated on WUA HEENT: NCAT, ETT in place Cardiovascular: RRR, S1/S2, no MRG Lungs: Bilateral airflow, no wheezes Abdomen: Soft, NT, +BS Ext: B AKA, L surgical wound clean and dry  LABS:  BMET  Recent Labs Lab 12/20/15 0542 12/21/15 0419 12/22/15 0204  NA 140 145 146*  K 3.8 3.9 3.2*  CL 113* 116* 114*  CO2 '22 24 23  '$ BUN 32* 36* 37*  CREATININE 1.32* 1.35* 1.34*  GLUCOSE 186* 285* 281*    Electrolytes  Recent Labs Lab 12/18/15 1310  12/20/15 0542 12/21/15 0419 12/22/15 0204  CALCIUM  --   < > 6.8* 7.2* 7.4*  MG 1.9  --  1.8  --  2.2  PHOS  --   --  2.3* 2.4* 2.4*  < > = values in this interval not displayed.  CBC  Recent Labs Lab 12/20/15 0542 12/21/15 0419 12/22/15 0204  WBC 21.9* 20.3* 22.6*  HGB 8.1* 8.4* 8.8*  HCT 25.8* 27.2* 27.8*  PLT 234 309 348    Coag's No results for input(s): APTT, INR in the last 168  hours.  Sepsis Markers  Recent Labs Lab 12/16/15 0519 12/17/15 0421 12/18/15 0526  LATICACIDVEN 2.5*  --   --   PROCALCITON 3.52 4.32 1.84    ABG  Recent Labs Lab 12/16/15 1430 12/17/15 0445 12/18/15 0500  PHART 7.48* 7.52* 7.39  PCO2ART 36 34 45  PO2ART 83 58* 81*    Liver Enzymes No results for input(s): AST, ALT, ALKPHOS, BILITOT, ALBUMIN in the last 168 hours.  Cardiac Enzymes  Recent Labs Lab 12/16/15 0440 12/21/15 0419  TROPONINI 2.70* 0.28*    Glucose  Recent Labs Lab 12/21/15 2020  12/22/15 0020 12/22/15 0229 12/22/15 0425 12/22/15 0627 12/22/15 0723  GLUCAP 203* 199* 254* 277* 259* 270*   Dg Chest Port 1 View  12/22/2015  CLINICAL DATA:  Ventilator support.  Respiratory failure. EXAM: PORTABLE CHEST 1 VIEW COMPARISON:  12/21/2015 FINDINGS: Endotracheal tube has its tip 5 cm above the carina. Nasogastric tube enters the stomach. Left arm PICC has its tip at the SVC RA junction lower in the very proximal right atrium. Bilateral airspace filling process, left worse than right, showing a trend of continued slight worsening. There is collapse of the left lower lobe. Findings could be due to edema, pneumonia or ARDS. IMPRESSION: Worsening of diffuse airspace filling, left worse than right. Collapse of the left lower lobe. Differential diagnosis is pneumonia, edema an ARDS. Electronically Signed   By: Nelson Chimes M.D.   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 Now with acute respiratory failure, intubated due to pneumonia. Underwent a left AKA    PULMONARY A: Prolonged VDRF Failed extubation attempt Bilateral pulmonary infiltrates and fever- c/w  PNA P:   Cont vent support - settings reviewed and/or adjusted Wean in PSV as tolerated Cont vent bundle Daily SBT if/when meets criteria Antibiotics as above  CXR in am Repeat respiratory cultures  Will need trach for survival  CARDIOVASCULAR A:  NSTEMI PAF, NSR presently NSVT P:  MAP goal > 65 mmHg Cont ASA Continue amiodarone  Hemodynamics per ICU protocol  RENAL A:   AKI, nonoliguric, creatinine stable at 1.35 Very poor candidate for HD P:   Monitor BMET intermittently Monitor  I/Os Correct electrolytes as indicated Increase free water flushes  GASTROINTESTINAL A:   No issues P:   SUP: enteral PPI Cont TFs as tolerated  HEMATOLOGIC A:   ICU acquired anemia without acute blood loss P:  DVT px: SQ heparin Monitor CBC intermittently Transfuse per usual guidelines   INFECTIOUS A:   Severe sepsis-persistent leukocytosis and fever LLE gangrene Aspiration PNA P:   -Vanco and zosyn -Repeat respirtory cultures and f/u pending culture - tylenol and motrin prn for fever >103  ENDOCRINE A:   Type 2 DM-Persistent hyperglycemia P:   Increase lantus to 25 units daily and change sliding scale to resistant scale Blood gluocose monitoring per ICU protocol  NEUROLOGIC A:   Multifocal CVAs, acute Acute encewphalopathy H/O seizures - not on chronic AEDs PTA P:   RASS goal: -1, -2 Fentanyl gtt and prn versed for vent sedation   FAMILY  No family at bedside during morning rounds. Plan is to meet with son and daughter together and discuss alternatives to peg/trach as well as prognosis and  overall goals of care.      The Patient requires high complexity decision making for assessment and support, frequent evaluation and titration of therapies, application of advanced monitoring technologies and extensive interpretation of multiple databases. Critical Care Time devoted to patient care services described in this note is 40 minutes.   Overall, patient is critically ill, prognosis is guarded.  Patient with Multiorgan failure and at high risk for cardiac arrest and death.    Corrin Parker, M.D.  [facility]  Medical Director Secretary Director [facility] Cardio-Pulmonary Department

## 2015-12-22 NOTE — Progress Notes (Signed)
Notified Dr. Mortimer Fries about patient's chest xray results- patient looks very uncomfortable and no blood gas drawn since 2/23- order for blood gas.

## 2015-12-22 NOTE — Progress Notes (Signed)
ANTIBIOTIC CONSULT NOTE - INITIAL  Pharmacy Consult for Eraxis Indication: Candidiasis  No Known Allergies  Patient Measurements: Height: '6\' 4"'$  (193 cm) Weight: 138 lb 10.7 oz (62.9 kg) IBW/kg (Calculated) : 86.8  Vital Signs: Temp: 99.6 F (37.6 C) (02/27 2000) Temp Source: Rectal (02/27 2000) BP: 113/64 mmHg (02/27 2000) Pulse Rate: 92 (02/27 2000) Intake/Output from previous day: 02/26 0701 - 02/27 0700 In: 2802.5 [I.V.:169.1; NG/GT:2080; IV Piggyback:553.3] Out: 2925 [Urine:2925] Intake/Output from this shift: Total I/O In: 140 [NG/GT:140] Out: -   Labs:  Recent Labs  12/20/15 0542 12/21/15 0419 12/22/15 0204  WBC 21.9* 20.3* 22.6*  HGB 8.1* 8.4* 8.8*  PLT 234 309 348  CREATININE 1.32* 1.35* 1.34*   Estimated Creatinine Clearance: 45.6 mL/min (by C-G formula based on Cr of 1.34). No results for input(s): VANCOTROUGH, VANCOPEAK, VANCORANDOM, GENTTROUGH, GENTPEAK, GENTRANDOM, TOBRATROUGH, TOBRAPEAK, TOBRARND, AMIKACINPEAK, AMIKACINTROU, AMIKACIN in the last 72 hours.   Microbiology: Recent Results (from the past 720 hour(s))  Culture, respiratory (NON-Expectorated)     Status: None   Collection Time: 12/06/15  4:05 PM  Result Value Ref Range Status   Specimen Description NASAL SWAB  Final   Special Requests NONE  Final   Gram Stain RARE WBC SEEN FEW GRAM POSITIVE COCCI   Final   Culture HEAVY GROWTH STAPHYLOCOCCUS AUREUS  Final   Report Status 12/09/2015 FINAL  Final   Organism ID, Bacteria STAPHYLOCOCCUS AUREUS  Final      Susceptibility   Staphylococcus aureus - MIC*    CIPROFLOXACIN 2 INTERMEDIATE Intermediate     GENTAMICIN <=0.5 SENSITIVE Sensitive     OXACILLIN <=0.25 SENSITIVE Sensitive     TETRACYCLINE <=1 SENSITIVE Sensitive     VANCOMYCIN 1 SENSITIVE Sensitive     TRIMETH/SULFA <=10 SENSITIVE Sensitive     RIFAMPIN <=0.5 SENSITIVE Sensitive     Inducible Clindamycin NEGATIVE Sensitive     * HEAVY GROWTH STAPHYLOCOCCUS AUREUS  Culture,  blood (Routine X 2) w Reflex to ID Panel     Status: None   Collection Time: 12/07/15 12:09 AM  Result Value Ref Range Status   Specimen Description BLOOD BLOOD RIGHT HAND  Final   Special Requests BOTTLES DRAWN AEROBIC AND ANAEROBIC 10CC  Final   Culture NO GROWTH 6 DAYS  Final   Report Status 12/13/2015 FINAL  Final  Culture, blood (Routine X 2) w Reflex to ID Panel     Status: None   Collection Time: 12/07/15 12:09 AM  Result Value Ref Range Status   Specimen Description BLOOD BLOOD LEFT HAND  Final   Special Requests BOTTLES DRAWN AEROBIC AND ANAEROBIC 10CC  Final   Culture NO GROWTH 6 DAYS  Final   Report Status 12/13/2015 FINAL  Final  Urine culture     Status: None   Collection Time: 12/07/15 12:11 AM  Result Value Ref Range Status   Specimen Description URINE, CLEAN CATCH  Final   Special Requests NONE  Final   Culture NO GROWTH 1 DAY  Final   Report Status 12/08/2015 FINAL  Final  MRSA PCR Screening     Status: None   Collection Time: 12/08/15  9:12 AM  Result Value Ref Range Status   MRSA by PCR NEGATIVE NEGATIVE Final    Comment:        The GeneXpert MRSA Assay (FDA approved for NASAL specimens only), is one component of a comprehensive MRSA colonization surveillance program. It is not intended to diagnose MRSA infection nor to guide or  monitor treatment for MRSA infections.   C difficile quick scan w PCR reflex     Status: None   Collection Time: 12/11/15  1:54 PM  Result Value Ref Range Status   C Diff antigen NEGATIVE NEGATIVE Final   C Diff toxin NEGATIVE NEGATIVE Final   C Diff interpretation Negative for C. difficile  Final  Culture, blood (Routine X 2) w Reflex to ID Panel     Status: None   Collection Time: 12/13/15  9:33 AM  Result Value Ref Range Status   Specimen Description BLOOD LEFT ANTECUBITAL  Final   Special Requests BOTTLES DRAWN AEROBIC AND ANAEROBIC  4CC  Final   Culture NO GROWTH 5 DAYS  Final   Report Status 12/18/2015 FINAL  Final   Culture, blood (Routine X 2) w Reflex to ID Panel     Status: None   Collection Time: 12/13/15  9:41 AM  Result Value Ref Range Status   Specimen Description BLOOD RIGHT ARM  Final   Special Requests BOTTLES DRAWN AEROBIC AND ANAEROBIC  2CC  Final   Culture NO GROWTH 5 DAYS  Final   Report Status 12/18/2015 FINAL  Final  Urine culture     Status: None   Collection Time: 12/13/15 11:01 AM  Result Value Ref Range Status   Specimen Description URINE, CATHETERIZED  Final   Special Requests NONE  Final   Culture NO GROWTH 1 DAY  Final   Report Status 12/14/2015 FINAL  Final  Culture, expectorated sputum-assessment     Status: None   Collection Time: 12/13/15 11:30 AM  Result Value Ref Range Status   Specimen Description TRACHEAL ASPIRATE  Final   Special Requests NONE  Final   Sputum evaluation THIS SPECIMEN IS ACCEPTABLE FOR SPUTUM CULTURE  Final   Report Status 12/13/2015 FINAL  Final  Culture, respiratory (NON-Expectorated)     Status: None   Collection Time: 12/13/15 11:30 AM  Result Value Ref Range Status   Specimen Description TRACHEAL ASPIRATE  Final   Special Requests NONE Reflexed from [id number]  Final   Gram Stain   Final    FEW WBC SEEN FEW GRAM POSITIVE COCCI FEW YEAST FAIR SPECIMEN - 70-80% WBCS    Culture LIGHT GROWTH CANDIDA ALBICANS  Final   Report Status 12/17/2015 FINAL  Final  Cath Tip Culture     Status: None   Collection Time: 12/14/15 12:47 PM  Result Value Ref Range Status   Specimen Description CATH TIP  Final   Special Requests NONE  Final   Culture NO GROWTH 3 DAYS  Final   Report Status 12/17/2015 FINAL  Final  Urine culture     Status: None   Collection Time: 12/16/15  8:19 AM  Result Value Ref Range Status   Specimen Description URINE, RANDOM  Final   Special Requests Normal  Final   Culture NO GROWTH 1 DAY  Final   Report Status 12/17/2015 FINAL  Final  CULTURE, BLOOD (ROUTINE X 2) w Reflex to PCR ID Panel     Status: None   Collection Time:  12/16/15  8:28 AM  Result Value Ref Range Status   Specimen Description BLOOD LEFT ARM  Final   Special Requests BOTTLES DRAWN AEROBIC AND ANAEROBIC 10ML  Final   Culture NO GROWTH 5 DAYS  Final   Report Status 12/21/2015 FINAL  Final  CULTURE, BLOOD (ROUTINE X 2) w Reflex to PCR ID Panel     Status: None   Collection  Time: 12/16/15  8:45 AM  Result Value Ref Range Status   Specimen Description BLOOD RIGHT ARM  Final   Special Requests BOTTLES DRAWN AEROBIC AND ANAEROBIC 10ML  Final   Culture NO GROWTH 5 DAYS  Final   Report Status 12/21/2015 FINAL  Final  C difficile quick scan w PCR reflex     Status: None   Collection Time: 12/16/15  4:26 PM  Result Value Ref Range Status   C Diff antigen NEGATIVE NEGATIVE Final   C Diff toxin NEGATIVE NEGATIVE Final   C Diff interpretation Negative for C. difficile  Final  Culture, expectorated sputum-assessment     Status: None   Collection Time: 12/16/15  5:32 PM  Result Value Ref Range Status   Specimen Description SPUTUM  Final   Special Requests NONE  Final   Sputum evaluation THIS SPECIMEN IS ACCEPTABLE FOR SPUTUM CULTURE  Final   Report Status 12/16/2015 FINAL  Final  Culture, respiratory (NON-Expectorated)     Status: None   Collection Time: 12/16/15  5:32 PM  Result Value Ref Range Status   Specimen Description SPUTUM  Final   Special Requests NONE Reflexed from [id number]  Final   Gram Stain   Final    MANY WBC SEEN RARE SQUAMOUS EPITHELIAL CELLS PRESENT FEW YEAST EXCELLENT SPECIMEN - 90-100% WBCS    Culture MODERATE GROWTH CANDIDA ALBICANS  Final   Report Status 12/20/2015 FINAL  Final  MRSA PCR Screening     Status: None   Collection Time: 12/17/15 11:50 AM  Result Value Ref Range Status   MRSA by PCR NEGATIVE NEGATIVE Final    Comment:        The GeneXpert MRSA Assay (FDA approved for NASAL specimens only), is one component of a comprehensive MRSA colonization surveillance program. It is not intended to diagnose  MRSA infection nor to guide or monitor treatment for MRSA infections.   Culture, respiratory (NON-Expectorated)     Status: None   Collection Time: 12/18/15 12:15 AM  Result Value Ref Range Status   Specimen Description TRACHEAL ASPIRATE  Final   Special Requests NONE  Final   Gram Stain   Final    GOOD SPECIMEN - 80-90% WBCS RARE SQUAMOUS EPITHELIAL CELLS PRESENT MANY WBC SEEN FEW YEAST    Culture LIGHT GROWTH CANDIDA ALBICANS  Final   Report Status 12/21/2015 FINAL  Final  Urine culture     Status: None   Collection Time: 12/18/15 12:24 AM  Result Value Ref Range Status   Specimen Description URINE, RANDOM  Final   Special Requests NONE  Final   Culture NO GROWTH 1 DAY  Final   Report Status 12/19/2015 FINAL  Final  Culture, blood (Routine X 2) w Reflex to ID Panel     Status: None (Preliminary result)   Collection Time: 12/18/15  4:29 AM  Result Value Ref Range Status   Specimen Description BLOOD RIGHT HAND  Final   Special Requests BOTTLES DRAWN AEROBIC AND ANAEROBIC 5ML  Final   Culture NO GROWTH 3 DAYS  Final   Report Status PENDING  Incomplete  Culture, blood (Routine X 2) w Reflex to ID Panel     Status: None (Preliminary result)   Collection Time: 12/18/15  4:37 AM  Result Value Ref Range Status   Specimen Description BLOOD LEFT ARM  Final   Special Requests BOTTLES DRAWN AEROBIC AND ANAEROBIC 10ML  Final   Culture NO GROWTH 3 DAYS  Final   Report Status  PENDING  Incomplete  Culture, respiratory (NON-Expectorated)     Status: None (Preliminary result)   Collection Time: 12/21/15  9:29 AM  Result Value Ref Range Status   Specimen Description TRACHEAL ASPIRATE  Final   Special Requests NONE  Final   Gram Stain PENDING  Incomplete   Culture HOLDING FOR POSSIBLE PATHOGEN  Final   Report Status PENDING  Incomplete    Medical History: Past Medical History  Diagnosis Date  . Hyperlipidemia   . Hypertension   . Diabetes mellitus ([place])   . CAD (coronary artery  disease)     a. s/p 2 v CABG in 2012 (LIMA-LAD & SVG-OM); b. cath 07/2012 s/p PCI/DES to ostial LCx and OM  . PAD (peripheral artery disease) ([place])     a. s/p right SFA stent; b. s/p right toe amputation 2011; c. s/p right AKA spring 2016  . Seizures ([place])   . Gangrene of foot ([place])   . Wheezing   . Lung cancer ([place])   . Prostate cancer ([place])   . Stroke [facility])     Medications:  Scheduled:  . amiodarone  200 mg Per Tube Daily  . [START ON 12/23/2015] anidulafungin  100 mg Intravenous Q24H  . anidulafungin  200 mg Intravenous Once  . antiseptic oral rinse  7 mL Mouth Rinse 10 times per day  . aspirin  162 mg Per Tube Daily  . chlorhexidine gluconate  15 mL Mouth Rinse BID  . free water  200 mL Per Tube Q4H  . heparin subcutaneous  5,000 Units Subcutaneous Q12H  . pantoprazole sodium  40 mg Per Tube Daily  . piperacillin-tazobactam (ZOSYN)  IV  3.375 g Intravenous 3 times per day  . vancomycin  750 mg Intravenous Q18H   Infusions:  . feeding supplement (VITAL HIGH PROTEIN) 1,000 mL (12/22/15 2000)  . fentaNYL infusion INTRAVENOUS 100 mcg/hr (12/22/15 2000)  . insulin (NOVOLIN-R) infusion 1.9 Units/hr (12/22/15 1947)   Assessment: Stephen Camacho is a 70 y.o. male ith hx PAD, CAD, DM, admitted with L foot pain and found to have ischemia and occlusion of Fem Pop bypass. 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 Has had persistent fevers and leukocytosis. 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 Has been on abx since admit with zosyn and vanco from 2/11- 2/19. Was off abx for one day 2/20 there restarted ceftazidime and vanco. Cultures with sputum growing MSSA from 2/11 otherwise candida on trach aspirate. To start empriric antifungal - eraxis.  Plan:  Ordered Eraxis '200mg'$  IV once for tonight, followed by by '100mg'$  IV Q24H beginning tomorrow evening (2/28).  Follow up culture results.  Deidrick Rainey K 12/22/2015,8:18 PM

## 2015-12-22 NOTE — Progress Notes (Signed)
Pt had two 5 beat runs of vtach and then a 9 beat run of vtach. Called Elink, will return call.

## 2015-12-22 NOTE — Progress Notes (Signed)
[facility] INFECTIOUS DISEASE PROGRESS NOTE Date of Admission:  12/05/2015     ID: Jude Naclerio is a 70 y.o. male with Active Problems:   Ischemic leg   Acute respiratory failure (HCC)   Ischemia of lower extremity   Altered mental status   Hematoma of neck   Occlusion of arterial bypass graft (HCC)   NSTEMI (non-ST elevated myocardial infarction) ([place])   Severe sepsis (HCC)   Urinary retention   Aspiration into respiratory tract   Encounter for intubation   Pressure ulcer   Subjective: Remains intubated Fevers recurred, diff weaning  ROS  Unable to obtain  Medications:  Antibiotics Given (last 72 hours)    Date/Time Action Medication Dose Rate   12/19/15 1706 Given   piperacillin-tazobactam (ZOSYN) IVPB 3.375 g 3.375 g 12.5 mL/hr   12/19/15 2258 Given   piperacillin-tazobactam (ZOSYN) IVPB 3.375 g 3.375 g 12.5 mL/hr   12/20/15 0538 Given   piperacillin-tazobactam (ZOSYN) IVPB 3.375 g 3.375 g 12.5 mL/hr   12/20/15 1411 Given   piperacillin-tazobactam (ZOSYN) IVPB 3.375 g 3.375 g 12.5 mL/hr   12/20/15 2118 Given   piperacillin-tazobactam (ZOSYN) IVPB 3.375 g 3.375 g 12.5 mL/hr   12/21/15 0557 Given   piperacillin-tazobactam (ZOSYN) IVPB 3.375 g 3.375 g 12.5 mL/hr   12/21/15 0951 Given   vancomycin (VANCOCIN) IVPB 750 mg/150 ml premix 750 mg 150 mL/hr   12/21/15 1408 Given   piperacillin-tazobactam (ZOSYN) IVPB 3.375 g 3.375 g 12.5 mL/hr   12/21/15 1716 Given   vancomycin (VANCOCIN) IVPB 750 mg/150 ml premix 750 mg 150 mL/hr   12/21/15 2212 Given   piperacillin-tazobactam (ZOSYN) IVPB 3.375 g 3.375 g 12.5 mL/hr   12/22/15 1026 Given   vancomycin (VANCOCIN) IVPB 750 mg/150 ml premix 750 mg 150 mL/hr   12/22/15 1413 Given   piperacillin-tazobactam (ZOSYN) IVPB 3.375 g 3.375 g 12.5 mL/hr     . amiodarone  200 mg Per Tube Daily  . antiseptic oral rinse  7 mL Mouth Rinse 10 times per day  . aspirin  162 mg Per Tube Daily  . chlorhexidine gluconate  15 mL  Mouth Rinse BID  . free water  200 mL Per Tube Q4H  . heparin subcutaneous  5,000 Units Subcutaneous Q12H  . pantoprazole sodium  40 mg Per Tube Daily  . piperacillin-tazobactam (ZOSYN)  IV  3.375 g Intravenous 3 times per day  . vancomycin  750 mg Intravenous Q18H    Objective: Vital signs in last 24 hours: Temp:  [98.2 F (36.8 C)-102.5 F (39.2 C)] 99.6 F (37.6 C) (02/27 1300) Pulse Rate:  [81-121] 98 (02/27 1300) Resp:  [17-41] 20 (02/27 1300) BP: (103-134)/(59-75) 110/62 mmHg (02/27 1300) SpO2:  [93 %-100 %] 100 % (02/27 1300) FiO2 (%):  [30 %] 30 % (02/27 1223) Weight:  [62.9 kg (138 lb 10.7 oz)] 62.9 kg (138 lb 10.7 oz) (02/27 0500) Constitutional: intubated, critically ill appearing HENT: anicteric Mouth/Throat: ETT in place R neck IJ -removed. PICC LUE Cardiovascular: Normal rate, regular rhythm and normal heart sounds. Pulmonary/Chest: mech BS Abdominal: Soft. Bowel sounds are normal. He exhibits no distension. There is no tenderness.  Lymphadenopathy: He has no cervical adenopathy.  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 No rash noted. No erythema.  Psychiatric: intubated  Lab Results  Recent Labs  12/21/15 0419 12/22/15 0204 12/22/15 1036  WBC 20.3* 22.6*  --   HGB 8.4* 8.8*  --  HCT 27.2* 27.8*  --   NA 145 146*  --   K 3.9 3.2* 3.6  CL 116* 114*  --   CO2 24 23  --   BUN 36* 37*  --   CREATININE 1.35* 1.34*  --     Microbiology: Results for orders placed or performed during the hospital encounter of 12/05/15  Culture, respiratory (NON-Expectorated)     Status: None   Collection Time: 12/06/15  4:05 PM  Result Value Ref Range Status   Specimen Description NASAL SWAB  Final   Special Requests NONE  Final   Gram Stain RARE WBC SEEN FEW GRAM POSITIVE COCCI   Final   Culture HEAVY GROWTH STAPHYLOCOCCUS AUREUS  Final   Report Status 12/09/2015 FINAL  Final   Organism ID, Bacteria  STAPHYLOCOCCUS AUREUS  Final      Susceptibility   Staphylococcus aureus - MIC*    CIPROFLOXACIN 2 INTERMEDIATE Intermediate     GENTAMICIN <=0.5 SENSITIVE Sensitive     OXACILLIN <=0.25 SENSITIVE Sensitive     TETRACYCLINE <=1 SENSITIVE Sensitive     VANCOMYCIN 1 SENSITIVE Sensitive     TRIMETH/SULFA <=10 SENSITIVE Sensitive     RIFAMPIN <=0.5 SENSITIVE Sensitive     Inducible Clindamycin NEGATIVE Sensitive     * HEAVY GROWTH STAPHYLOCOCCUS AUREUS  Culture, blood (Routine X 2) w Reflex to ID Panel     Status: None   Collection Time: 12/07/15 12:09 AM  Result Value Ref Range Status   Specimen Description BLOOD BLOOD RIGHT HAND  Final   Special Requests BOTTLES DRAWN AEROBIC AND ANAEROBIC 10CC  Final   Culture NO GROWTH 6 DAYS  Final   Report Status 12/13/2015 FINAL  Final  Culture, blood (Routine X 2) w Reflex to ID Panel     Status: None   Collection Time: 12/07/15 12:09 AM  Result Value Ref Range Status   Specimen Description BLOOD BLOOD LEFT HAND  Final   Special Requests BOTTLES DRAWN AEROBIC AND ANAEROBIC 10CC  Final   Culture NO GROWTH 6 DAYS  Final   Report Status 12/13/2015 FINAL  Final  Urine culture     Status: None   Collection Time: 12/07/15 12:11 AM  Result Value Ref Range Status   Specimen Description URINE, CLEAN CATCH  Final   Special Requests NONE  Final   Culture NO GROWTH 1 DAY  Final   Report Status 12/08/2015 FINAL  Final  MRSA PCR Screening     Status: None   Collection Time: 12/08/15  9:12 AM  Result Value Ref Range Status   MRSA by PCR NEGATIVE NEGATIVE Final    Comment:        The GeneXpert MRSA Assay (FDA approved for NASAL specimens only), is one component of a comprehensive MRSA colonization surveillance program. It is not intended to diagnose MRSA infection nor to guide or monitor treatment for MRSA infections.   C difficile quick scan w PCR reflex     Status: None   Collection Time: 12/11/15  1:54 PM  Result Value Ref Range Status   C  Diff antigen NEGATIVE NEGATIVE Final   C Diff toxin NEGATIVE NEGATIVE Final   C Diff interpretation Negative for C. difficile  Final  Culture, blood (Routine X 2) w Reflex to ID Panel     Status: None   Collection Time: 12/13/15  9:33 AM  Result Value Ref Range Status   Specimen Description BLOOD LEFT ANTECUBITAL  Final   Special Requests  BOTTLES DRAWN AEROBIC AND ANAEROBIC  4CC  Final   Culture NO GROWTH 5 DAYS  Final   Report Status 12/18/2015 FINAL  Final  Culture, blood (Routine X 2) w Reflex to ID Panel     Status: None   Collection Time: 12/13/15  9:41 AM  Result Value Ref Range Status   Specimen Description BLOOD RIGHT ARM  Final   Special Requests BOTTLES DRAWN AEROBIC AND ANAEROBIC  2CC  Final   Culture NO GROWTH 5 DAYS  Final   Report Status 12/18/2015 FINAL  Final  Urine culture     Status: None   Collection Time: 12/13/15 11:01 AM  Result Value Ref Range Status   Specimen Description URINE, CATHETERIZED  Final   Special Requests NONE  Final   Culture NO GROWTH 1 DAY  Final   Report Status 12/14/2015 FINAL  Final  Culture, expectorated sputum-assessment     Status: None   Collection Time: 12/13/15 11:30 AM  Result Value Ref Range Status   Specimen Description TRACHEAL ASPIRATE  Final   Special Requests NONE  Final   Sputum evaluation THIS SPECIMEN IS ACCEPTABLE FOR SPUTUM CULTURE  Final   Report Status 12/13/2015 FINAL  Final  Culture, respiratory (NON-Expectorated)     Status: None   Collection Time: 12/13/15 11:30 AM  Result Value Ref Range Status   Specimen Description TRACHEAL ASPIRATE  Final   Special Requests NONE Reflexed from [id number]  Final   Gram Stain   Final    FEW WBC SEEN FEW GRAM POSITIVE COCCI FEW YEAST FAIR SPECIMEN - 70-80% WBCS    Culture LIGHT GROWTH CANDIDA ALBICANS  Final   Report Status 12/17/2015 FINAL  Final  Cath Tip Culture     Status: None   Collection Time: 12/14/15 12:47 PM  Result Value Ref Range Status   Specimen Description CATH  TIP  Final   Special Requests NONE  Final   Culture NO GROWTH 3 DAYS  Final   Report Status 12/17/2015 FINAL  Final  Urine culture     Status: None   Collection Time: 12/16/15  8:19 AM  Result Value Ref Range Status   Specimen Description URINE, RANDOM  Final   Special Requests Normal  Final   Culture NO GROWTH 1 DAY  Final   Report Status 12/17/2015 FINAL  Final  CULTURE, BLOOD (ROUTINE X 2) w Reflex to PCR ID Panel     Status: None   Collection Time: 12/16/15  8:28 AM  Result Value Ref Range Status   Specimen Description BLOOD LEFT ARM  Final   Special Requests BOTTLES DRAWN AEROBIC AND ANAEROBIC 10ML  Final   Culture NO GROWTH 5 DAYS  Final   Report Status 12/21/2015 FINAL  Final  CULTURE, BLOOD (ROUTINE X 2) w Reflex to PCR ID Panel     Status: None   Collection Time: 12/16/15  8:45 AM  Result Value Ref Range Status   Specimen Description BLOOD RIGHT ARM  Final   Special Requests BOTTLES DRAWN AEROBIC AND ANAEROBIC 10ML  Final   Culture NO GROWTH 5 DAYS  Final   Report Status 12/21/2015 FINAL  Final  C difficile quick scan w PCR reflex     Status: None   Collection Time: 12/16/15  4:26 PM  Result Value Ref Range Status   C Diff antigen NEGATIVE NEGATIVE Final   C Diff toxin NEGATIVE NEGATIVE Final   C Diff interpretation Negative for C. difficile  Final  Culture,  expectorated sputum-assessment     Status: None   Collection Time: 12/16/15  5:32 PM  Result Value Ref Range Status   Specimen Description SPUTUM  Final   Special Requests NONE  Final   Sputum evaluation THIS SPECIMEN IS ACCEPTABLE FOR SPUTUM CULTURE  Final   Report Status 12/16/2015 FINAL  Final  Culture, respiratory (NON-Expectorated)     Status: None   Collection Time: 12/16/15  5:32 PM  Result Value Ref Range Status   Specimen Description SPUTUM  Final   Special Requests NONE Reflexed from [id number]  Final   Gram Stain   Final    MANY WBC SEEN RARE SQUAMOUS EPITHELIAL CELLS PRESENT FEW YEAST EXCELLENT  SPECIMEN - 90-100% WBCS    Culture MODERATE GROWTH CANDIDA ALBICANS  Final   Report Status 12/20/2015 FINAL  Final  MRSA PCR Screening     Status: None   Collection Time: 12/17/15 11:50 AM  Result Value Ref Range Status   MRSA by PCR NEGATIVE NEGATIVE Final    Comment:        The GeneXpert MRSA Assay (FDA approved for NASAL specimens only), is one component of a comprehensive MRSA colonization surveillance program. It is not intended to diagnose MRSA infection nor to guide or monitor treatment for MRSA infections.   Culture, respiratory (NON-Expectorated)     Status: None   Collection Time: 12/18/15 12:15 AM  Result Value Ref Range Status   Specimen Description TRACHEAL ASPIRATE  Final   Special Requests NONE  Final   Gram Stain   Final    GOOD SPECIMEN - 80-90% WBCS RARE SQUAMOUS EPITHELIAL CELLS PRESENT MANY WBC SEEN FEW YEAST    Culture LIGHT GROWTH CANDIDA ALBICANS  Final   Report Status 12/21/2015 FINAL  Final  Urine culture     Status: None   Collection Time: 12/18/15 12:24 AM  Result Value Ref Range Status   Specimen Description URINE, RANDOM  Final   Special Requests NONE  Final   Culture NO GROWTH 1 DAY  Final   Report Status 12/19/2015 FINAL  Final  Culture, blood (Routine X 2) w Reflex to ID Panel     Status: None (Preliminary result)   Collection Time: 12/18/15  4:29 AM  Result Value Ref Range Status   Specimen Description BLOOD RIGHT HAND  Final   Special Requests BOTTLES DRAWN AEROBIC AND ANAEROBIC 5ML  Final   Culture NO GROWTH 3 DAYS  Final   Report Status PENDING  Incomplete  Culture, blood (Routine X 2) w Reflex to ID Panel     Status: None (Preliminary result)   Collection Time: 12/18/15  4:37 AM  Result Value Ref Range Status   Specimen Description BLOOD LEFT ARM  Final   Special Requests BOTTLES DRAWN AEROBIC AND ANAEROBIC 10ML  Final   Culture NO GROWTH 3 DAYS  Final   Report Status PENDING  Incomplete  Culture, respiratory (NON-Expectorated)      Status: None (Preliminary result)   Collection Time: 12/21/15  9:29 AM  Result Value Ref Range Status   Specimen Description TRACHEAL ASPIRATE  Final   Special Requests NONE  Final   Gram Stain PENDING  Incomplete   Culture HOLDING FOR POSSIBLE PATHOGEN  Final   Report Status PENDING  Incomplete    Studies/Results: Dg Chest Port 1 View  12/22/2015  CLINICAL DATA:  Ventilator support.  Respiratory failure. EXAM: PORTABLE CHEST 1 VIEW COMPARISON:  12/21/2015 FINDINGS: Endotracheal tube has its tip 5 cm above the  carina. Nasogastric tube enters the stomach. Left arm PICC has its tip at the SVC RA junction lower in the very proximal right atrium. Bilateral airspace filling process, left worse than right, showing a trend of continued slight worsening. There is collapse of the left lower lobe. Findings could be due to edema, pneumonia or ARDS. IMPRESSION: Worsening of diffuse airspace filling, left worse than right. Collapse of the left lower lobe. Differential diagnosis is pneumonia, edema an ARDS. Electronically Signed   By: Nelson Chimes M.D.   On: 12/22/2015 07:26   Dg Chest Port 1 View  12/21/2015  CLINICAL DATA:  70 year old male with respiratory failure. EXAM: PORTABLE CHEST 1 VIEW COMPARISON:  12/20/2015 FINDINGS: An endotracheal tube with tip 5.6 cm above carina, NG tube entering the stomach with tip off field of view, left subclavian central venous catheter with tip overlying the superior cavoatrial junction again noted. Bilateral airspace opacities/ edema have slightly increased. Cardiomegaly, CABG changes and bilateral upper lung surgical changes again identified. Left retrocardiac density has slightly increased. There is no evidence of pneumothorax or large pleural effusion. IMPRESSION: Slight increased bilateral airspace opacities/ edema. Slightly increased left retrocardiac opacity/atelectasis. No other significant changes. Electronically Signed   By: Margarette Canada M.D.   On: 12/21/2015  07:30    Assessment/Plan: Assessment:  Brianna Esson is a 70 y.o. male ith hx PAD, CAD, DM, admitted with L foot pain and found to have ischemia and occlusion of Fem Pop bypass. 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 Has had persistent fevers and leukocytosis.  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 Has been on abx since admit with zosyn and vanco from 2/11- 2/19. Was off abx for one day 2/20 there restarted ceftazidime and vanco. Cultures with sputum growing MSSA from 2/11 otherwise candida on trach aspirate.  Amputation site looks very good so doubt source of elevated wbc  IJ line removed, Picc place.  Recommendations He has sputum cx 2/21 being held for possible pathogen Cont zosyn Cont vanco Start empriric antifungal - eraxis  Thank you very much for the consult. Will follow with you.  [place], [place]   12/22/2015, 4:10 PM

## 2015-12-22 NOTE — Addendum Note (Signed)
Addendum  created 12/22/15 1018 by Molli Barrows, MD   Modules edited: Anesthesia Attestations   Anesthesia Attestations:  Attestation Deleted

## 2015-12-23 LAB — CBC
HCT: 24.3 % — ABNORMAL LOW (ref 40.0–52.0)
Hemoglobin: 7.6 g/dL — ABNORMAL LOW (ref 13.0–18.0)
MCH: 27.2 pg (ref 26.0–34.0)
MCHC: 31.4 g/dL — AB (ref 32.0–36.0)
MCV: 86.8 fL (ref 80.0–100.0)
PLATELETS: 396 10*3/uL (ref 150–440)
RBC: 2.81 MIL/uL — ABNORMAL LOW (ref 4.40–5.90)
RDW: 16.9 % — AB (ref 11.5–14.5)
WBC: 18.5 10*3/uL — ABNORMAL HIGH (ref 3.8–10.6)

## 2015-12-23 LAB — GLUCOSE, CAPILLARY
GLUCOSE-CAPILLARY: 206 mg/dL — AB (ref 65–99)
GLUCOSE-CAPILLARY: 231 mg/dL — AB (ref 65–99)
GLUCOSE-CAPILLARY: 121 mg/dL — AB (ref 65–99)
GLUCOSE-CAPILLARY: 165 mg/dL — AB (ref 65–99)
GLUCOSE-CAPILLARY: 193 mg/dL — AB (ref 65–99)
GLUCOSE-CAPILLARY: 173 mg/dL — AB (ref 65–99)
GLUCOSE-CAPILLARY: 128 mg/dL — AB (ref 65–99)
GLUCOSE-CAPILLARY: 99 mg/dL (ref 65–99)
GLUCOSE-CAPILLARY: 162 mg/dL — AB (ref 65–99)
Glucose-Capillary: 111 mg/dL — ABNORMAL HIGH (ref 65–99)
Glucose-Capillary: 132 mg/dL — ABNORMAL HIGH (ref 65–99)
Glucose-Capillary: 134 mg/dL — ABNORMAL HIGH (ref 65–99)
Glucose-Capillary: 136 mg/dL — ABNORMAL HIGH (ref 65–99)
Glucose-Capillary: 161 mg/dL — ABNORMAL HIGH (ref 65–99)
Glucose-Capillary: 185 mg/dL — ABNORMAL HIGH (ref 65–99)
Glucose-Capillary: 188 mg/dL — ABNORMAL HIGH (ref 65–99)
Glucose-Capillary: 202 mg/dL — ABNORMAL HIGH (ref 65–99)
Glucose-Capillary: 208 mg/dL — ABNORMAL HIGH (ref 65–99)

## 2015-12-23 LAB — CULTURE, BLOOD (ROUTINE X 2)
CULTURE: NO GROWTH
Culture: NO GROWTH

## 2015-12-23 LAB — BASIC METABOLIC PANEL
Anion gap: 7 (ref 5–15)
BUN: 45 mg/dL — AB (ref 6–20)
CALCIUM: 7.7 mg/dL — AB (ref 8.9–10.3)
CHLORIDE: 113 mmol/L — AB (ref 101–111)
CO2: 27 mmol/L (ref 22–32)
CREATININE: 1.19 mg/dL (ref 0.61–1.24)
GFR calc Af Amer: >60 mL/min (ref >60)
GFR calc non Af Amer: >60 mL/min (ref >60)
Glucose, Bld: 225 mg/dL — ABNORMAL HIGH (ref 65–99)
Potassium: 3.3 mmol/L — ABNORMAL LOW (ref 3.5–5.1)
Sodium: 147 mmol/L — ABNORMAL HIGH (ref 135–145)

## 2015-12-23 LAB — MAGNESIUM: Magnesium: 1.9 mg/dL (ref 1.7–2.4)

## 2015-12-23 LAB — PHOSPHORUS: Phosphorus: 2.7 mg/dL (ref 2.5–4.6)

## 2015-12-23 LAB — VANCOMYCIN, TROUGH: VANCOMYCIN TR: 13 ug/mL (ref 10–20)

## 2015-12-23 MED ORDER — POTASSIUM CHLORIDE 10 MEQ/100ML IV SOLN
10.0000 meq | INTRAVENOUS | Status: AC
  Administered 2015-12-23 (×4): 10 meq via INTRAVENOUS
  Filled 2015-12-23 (×5): qty 100

## 2015-12-23 MED ORDER — VANCOMYCIN HCL IN DEXTROSE 1-5 GM/200ML-% IV SOLN
1000.0000 mg | INTRAVENOUS | Status: DC
  Administered 2015-12-23 – 2015-12-25 (×3): 1000 mg via INTRAVENOUS
  Filled 2015-12-23 (×4): qty 200

## 2015-12-23 MED ORDER — MAGNESIUM SULFATE 2 GM/50ML IV SOLN
2.0000 g | Freq: Once | INTRAVENOUS | Status: AC
  Administered 2015-12-23: 2 g via INTRAVENOUS
  Filled 2015-12-23 (×2): qty 50

## 2015-12-23 MED ORDER — ATORVASTATIN CALCIUM 20 MG PO TABS
40.0000 mg | ORAL_TABLET | Freq: Every day | ORAL | Status: DC
  Administered 2015-12-23 – 2015-12-25 (×3): 40 mg via ORAL
  Filled 2015-12-23 (×3): qty 2

## 2015-12-23 NOTE — Care Management (Signed)
Spoke with patient's daughter [name] and son [name].  They wish to proceed with aggressive treatment and agreeable to proceed with ltac referral.  Discussed that must receive approval from insurance company.  If this is denied and unable to wean off vent, would need to seek a skilled nursing facility that can manage ventilators and those would be [place] of state.  Facility preference right now [facility] be [facility]. Informed that not sure of in and out of network benefits/contracts. Notified liaison.  Mac and [name] also speak of a facility in [place]- do not know if they are speaking of an ltac or vent snf.  [name] is currently working with a Product/process development scientist at [employer] on a Insurance underwriter

## 2015-12-23 NOTE — Progress Notes (Signed)
GI Note:  Possible PEG tomorrow late morning if we can reach the family.    Hold tube feeds after mn.

## 2015-12-23 NOTE — Progress Notes (Signed)
PULMONARY / CRITICAL CARE MEDICINE   Name: Stephen Camacho MRN: [id number] DOB: [birth year]    ADMISSION DATE:  12/05/2015 CONSULTATION DATE:  12/08/15  History of Present Illness:   70 YO male with 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 Now with acute respiratory failure, intubated due to pneumonia. Underwent a left AKA   SUBJECTIVE:  Sedated on the vent. Increased WOB on vent, on Fentanyl gtt and versed started;on zosyn/Vanc/antfungal therapy. Still with high fevers started on antifungal therapy Remains sedated and critically ill, awaiting Trach and PEG tube placement  EVENTS/DATA: 02/10 >> LLE angiogram, thrombolysis, thrombectomy, angioplasty 02/11 Intubated for suspected PNA 02/11 elevated trop I. Pk trop I 62.6 on 02/20 02/12 CT head: New nonhemorrhagic infarcts involving the right PCA territory and bilateral cerebellum, left greater than right 02/17 L AKA 02/18>extubated 02/21 TTE: LVEF 55-60% 02/21 RUE [place]: no DVT 02/21>Re-intubated 02/22 >CT head: Interval evolution of bilateral cerebellar and right PCA territory infarcts 02/27-remains intubated,discussion with family-proceed with trach and PEG  INDWELLING DEVICES:: ETT 02/11 >> 02/18, 02/21 >>  R IJ CVL 02/10 >> removed PICC line RT  MICRO DATA: MRSA PCR 02/13 >> NEG Urine 02/12 >> NEG Resp 02/11 >> MSSA C diff 02/16 >> NEG Blood 02/12 >> NEG Urine 02/19 >> NEG C diff 02/21 >> NEG Cath tip 02/19 >> NEG Resp 02/18 >> light growth candida Urine 02/21 >> NEG MRSA PCR 02/22 >> NEG Respiratory 02/26>>  ANTIMICROBIALS:  Vanc 02/11 >> 02/23 Pip-tazo 02/11 >> 02/20 Ceftaz 02/21 >> 02/24 Metronidazole 02/21 >> 02/24 Pip-tazo 02/24 >>  Vancomycin 02/25>>   VITAL SIGNS: BP 121/68 mmHg  Pulse 87  Temp(Src)  99.5 F (37.5 C) (Rectal)  Resp 15  Ht '6\' 4"'$  (1.93 m)  Wt 132 lb 0.9 oz (59.9 kg)  BMI 16.08 kg/m2  SpO2 100%  HEMODYNAMICS:    VENTILATOR SETTINGS: Vent Mode:  [-] PRVC FiO2 (%):  [30 %] 30 % Set Rate:  [15 bmp] 15 bmp Vt Set:  [500 mL] 500 mL PEEP:  [5 cmH20] 5 cmH20  INTAKE / OUTPUT: I/O last 3 completed shifts: In: 2545.2 [I.V.:101.9; NG/GT:1990; IV Piggyback:453.3] Out: 7829 [Urine:3775]  PHYSICAL EXAMINATION: General: Chronically ill appearing;+resp distress Neuro: CNs intact, MAEs, agitated on WUA HEENT: NCAT, ETT in place Cardiovascular: RRR, S1/S2, no MRG Lungs: Bilateral airflow, no wheezes Abdomen: Soft, NT, +BS Ext: B AKA, L surgical wound clean and dry  LABS:  BMET  Recent Labs Lab 12/21/15 0419 12/22/15 0204 12/22/15 1036 12/23/15 0533  NA 145 146*  --  147*  K 3.9 3.2* 3.6 3.3*  CL 116* 114*  --  113*  CO2 24 23  --  27  BUN 36* 37*  --  45*  CREATININE 1.35* 1.34*  --  1.19  GLUCOSE 285* 281*  --  225*    Electrolytes  Recent Labs Lab 12/18/15 1310  12/20/15 0542 12/21/15 0419 12/22/15 0204 12/22/15 0819 12/23/15 0533  CALCIUM  --   < > 6.8* 7.2* 7.4*  --  7.7*  MG 1.9  --  1.8  --  2.2  --   --   PHOS  --   --  2.3* 2.4* 2.4* 3.5  --   < > = values in this interval not displayed.  CBC  Recent Labs Lab 12/21/15 0419 12/22/15 0204 12/23/15 0533  WBC 20.3* 22.6* 18.5*  HGB 8.4* 8.8* 7.6*  HCT 27.2* 27.8* 24.3*  PLT 309 348 396    Coag's No results for input(s): APTT, INR in the last 168 hours.  Sepsis Markers  Recent Labs Lab 12/17/15 0421 12/18/15 0526  PROCALCITON 4.32 1.84    ABG  Recent Labs Lab 12/17/15 0445 12/18/15 0500 12/22/15 0850  PHART 7.52* 7.39 7.40  PCO2ART 34 45 40  PO2ART 58* 81* 67*    Liver Enzymes No results for input(s): AST, ALT, ALKPHOS, BILITOT, ALBUMIN in the last 168 hours.  Cardiac Enzymes  Recent Labs Lab 12/21/15 0419  TROPONINI 0.28*    Glucose  Recent  Labs Lab 12/23/15 0255 12/23/15 0356 12/23/15 0453 12/23/15 0600 12/23/15 0652 12/23/15 0754  GLUCAP 185* 206* 231* 202* 134* 111*   No results found. 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 Now with acute respiratory failure, intubated due to pneumonia. Underwent a left AKA    PULMONARY A: Prolonged VDRF Failed extubation attempt Bilateral pulmonary infiltrates and fever- c/w  PNA P:   Cont vent support - settings reviewed and/or adjusted Wean in PSV as tolerated Cont vent bundle Daily SBT if/when meets criteria Antibiotics as above  CXR in am Repeat respiratory cultures  Will need trach for survival-ENT consulted  CARDIOVASCULAR A:  NSTEMI PAF, NSR presently NSVT P:  MAP goal > 65 mmHg Cont ASA Continue amiodarone  Hemodynamics per ICU protocol  RENAL A:   AKI, nonoliguric, creatinine stable at 1.35 Very poor candidate for HD P:   Monitor BMET intermittently Monitor I/Os Correct electrolytes as indicated Increase free water flushes  GASTROINTESTINAL A:   No issues P:   SUP: enteral PPI Cont TFs as tolerated -needs PEG tube-Gi consult placed  HEMATOLOGIC A:   ICU acquired anemia without acute blood loss P:  DVT px: SQ heparin Monitor CBC intermittently Transfuse per usual guidelines   INFECTIOUS A:   Severe sepsis-persistent leukocytosis and fever LLE gangrene Aspiration PNA P:   -Vanco and zosyn, antifungal therapy -Repeat respirtory cultures and f/u pending culture - tylenol and motrin prn for fever >103 -follow up ID recs  ENDOCRINE A:   Type 2 DM-Persistent hyperglycemia P:   -started insulin infusion Blood gluocose monitoring  per ICU protocol  NEUROLOGIC A:   Multifocal CVAs, acute Acute encewphalopathy H/O seizures - not on chronic AEDs PTA P:   RASS goal: -1, -2 Fentanyl gtt and prn versed for vent sedation       The Patient requires high complexity decision making for assessment and support, frequent evaluation and titration of therapies, application of advanced monitoring technologies and extensive interpretation of multiple databases. Critical Care Time devoted to patient care services described in this note is 40 minutes.   Overall, patient is critically ill, prognosis is guarded.  Patient with Multiorgan failure and at high risk for cardiac arrest and death.    Corrin Parker, M.D.  [facility]  Medical Director [place] Director [facility] Cardio-Pulmonary Department

## 2015-12-23 NOTE — Progress Notes (Signed)
Inpatient Diabetes Program Recommendations  AACE/ADA: New Consensus Statement on Inpatient Glycemic Control (2015)  Target Ranges:  Prepandial:   less than 140 mg/dL      Peak postprandial:   less than 180 mg/dL (1-2 hours)      Critically ill patients:  140 - 180 mg/dL   Review of Glycemic Control:  Note insulin drip rates variable and patient does not meet transition criteria at this time.  Per protocol patient will continue on insulin drip until: drip rate less than 4 units/hr, blood sugars less than 180 mg/dL, and tube feeds at goal rate.   Thanks,   Adah Perl, RN, BC-ADM Inpatient Diabetes Coordinator Pager [phone] (8a-5p)

## 2015-12-23 NOTE — Progress Notes (Signed)
Pt remains on vent, settings include: 15,500,30%,5.  Sedated with fentanyl drip at 100 mcg/hr.  Pt resting comfortably, will arouse to voice but doesn't follow commands.  NSR with T wave depression.  Rhonchi upon auscultation.  Tube feeds infusing at goal rate of 70 ml/hr, tolerating feeds.  Adequate urine output from foley.  VSS, low grade temp, temp max 99.9 as start of shift, cooling blanket off since 0000.  Talked with pt daughter and updated her via telephone.  Remains on insulin drip.

## 2015-12-23 NOTE — Progress Notes (Signed)
Notified Dr. Marcille Blanco of pt morning labwork: Potassium 3.3 and Hemoglobin 7.6 (no active signs of bleeding, was 8.8 yesterday).  Per Dr. Marcille Blanco order for iv potassium replacement.

## 2015-12-23 NOTE — Progress Notes (Signed)
Chaplain rounded the unit and provided a compassionate presence, support and silent prayer.Stephen Camacho [phone]

## 2015-12-23 NOTE — Progress Notes (Signed)
Pharmacy Antibiotic Note  Stephen Camacho is a 70 y.o. male admitted on 12/05/2015 with pneumonia.  Pharmacy has been consulted for vancomycin and Zosyn dosing.  Plan: Vancomycin 750 IV every 18 hours. Goal trough 15-20 mcg/mL.  Zosyn 3.375 g EI q 8 hours.   Anidulafungin '100mg'$  IV Q24hr.    Pharmacy will continue to monitor and adjust per consult.   Height: '6\' 4"'$  (193 cm) Weight: 132 lb 0.9 oz (59.9 kg) IBW/kg (Calculated) : 86.8  Temp (24hrs), Avg:99.5 F (37.5 C), Min:98.6 F (37 C), Max:100.3 F (37.9 C)   Recent Labs Lab 12/19/15 0600 12/20/15 0542 12/21/15 0419 12/22/15 0204 12/23/15 0533  WBC 28.9* 21.9* 20.3* 22.6* 18.5*  CREATININE 1.35* 1.32* 1.35* 1.34* 1.19    Estimated Creatinine Clearance: 48.9 mL/min (by C-G formula based on Cr of 1.19).    No Known Allergies  Antimicrobials this admission: vancomycin 02/22 >> 02/23 ceftazidime 02/22 >> 02/24 Metronidazole 02/23 >> 02/24 Zosyn 02/24 >> Vancomycin 02/26 >> Anidulafungin 02/27>>  Dose adjustments this admission:   Microbiology results: 02/23 BCx: No growth 02/23 UCx: negative  02/21 Sputum: Candida albicans  02/22 MRSA PCR: negative 02/26 TA pending  Thank you for allowing pharmacy to be a part of this patient's care.  Simpson,Michael L 12/23/2015 10:16 PM

## 2015-12-23 NOTE — Progress Notes (Signed)
Pharmacy Antibiotic Note  Stephen Camacho is a 70 y.o. male admitted on 12/05/2015 with pneumonia.  Pharmacy has been consulted for vancomycin and Zosyn dosing.   Plan: Vancomycin trough 13 mcg/mL. Recalculated Ke 0.048 hr-1. Increase to 1000 mg IV Q18H, predicted trough 17 mcg/mL. Pharmacy will continue to follow and adjust dose as needed to maintain trough 15 to 20 mcg/mL.  Height: '6\' 4"'$  (193 cm) Weight: 132 lb 0.9 oz (59.9 kg) IBW/kg (Calculated) : 86.8  Temp (24hrs), Avg:99.5 F (37.5 C), Min:98.6 F (37 C), Max:100.3 F (37.9 C)   Recent Labs Lab 12/19/15 0600 12/20/15 0542 12/21/15 0419 12/22/15 0204 12/23/15 0533 12/23/15 2137  WBC 28.9* 21.9* 20.3* 22.6* 18.5*  --   CREATININE 1.35* 1.32* 1.35* 1.34* 1.19  --   VANCOTROUGH  --   --   --   --   --  13    Estimated Creatinine Clearance: 48.9 mL/min (by C-G formula based on Cr of 1.19).    No Known Allergies  Antimicrobials this admission: vancomycin 02/22 >> 02/23 ceftazidime 02/22 >> 02/24 Metronidazole 02/23 >> 02/24 Zosyn 02/24 >> Vancomycin 02/26 >> Anidulafungin 02/27>>  Dose adjustments this admission:   Microbiology results: 02/23 BCx: No growth 02/23 UCx: negative  02/21 Sputum: Candida albicans  02/22 MRSA PCR: negative 02/26 TA pending  Thank you for allowing pharmacy to be a part of this patient's care.  Laural Benes, Pharm.D., BCPS Clinical Pharmacist 12/23/2015 11:01 PM

## 2015-12-24 ENCOUNTER — Other Ambulatory Visit: Payer: Self-pay | Admitting: Diagnostic Radiology

## 2015-12-24 ENCOUNTER — Inpatient Hospital Stay: Payer: PPO

## 2015-12-24 ENCOUNTER — Encounter
Admission: EM | Disposition: A | Discharge status: Nursing Home (Includes ICF) | Payer: Self-pay | Source: Home / Self Care | Attending: Vascular Surgery

## 2015-12-24 ENCOUNTER — Encounter: Payer: Self-pay | Admitting: Surgery

## 2015-12-24 DIAGNOSIS — R140 Abdominal distension (gaseous): Secondary | ICD-10-CM | POA: Insufficient documentation

## 2015-12-24 DIAGNOSIS — K668 Other specified disorders of peritoneum: Secondary | ICD-10-CM | POA: Insufficient documentation

## 2015-12-24 HISTORY — PX: PEG PLACEMENT: SHX5437

## 2015-12-24 LAB — BASIC METABOLIC PANEL
Anion gap: 8 (ref 5–15)
BUN: 38 mg/dL — AB (ref 6–20)
CHLORIDE: 112 mmol/L — AB (ref 101–111)
CO2: 24 mmol/L (ref 22–32)
CREATININE: 1.23 mg/dL (ref 0.61–1.24)
Calcium: 7.8 mg/dL — ABNORMAL LOW (ref 8.9–10.3)
GFR calc non Af Amer: 58 mL/min — ABNORMAL LOW (ref >60)
Glucose, Bld: 210 mg/dL — ABNORMAL HIGH (ref 65–99)
POTASSIUM: 4.3 mmol/L (ref 3.5–5.1)
Sodium: 144 mmol/L (ref 135–145)

## 2015-12-24 LAB — CBC
HEMATOCRIT: 25 % — AB (ref 40.0–52.0)
HEMOGLOBIN: 7.6 g/dL — AB (ref 13.0–18.0)
MCH: 27.3 pg (ref 26.0–34.0)
MCHC: 30.3 g/dL — AB (ref 32.0–36.0)
MCV: 89.8 fL (ref 80.0–100.0)
Platelets: 474 10*3/uL — ABNORMAL HIGH (ref 150–440)
RBC: 2.78 MIL/uL — AB (ref 4.40–5.90)
RDW: 17.2 % — ABNORMAL HIGH (ref 11.5–14.5)
WBC: 18.5 10*3/uL — ABNORMAL HIGH (ref 3.8–10.6)

## 2015-12-24 LAB — GLUCOSE, CAPILLARY
GLUCOSE-CAPILLARY: 213 mg/dL — AB (ref 65–99)
GLUCOSE-CAPILLARY: 137 mg/dL — AB (ref 65–99)
GLUCOSE-CAPILLARY: 62 mg/dL — AB (ref 65–99)
Glucose-Capillary: 87 mg/dL (ref 65–99)
Glucose-Capillary: 120 mg/dL — ABNORMAL HIGH (ref 65–99)
Glucose-Capillary: 127 mg/dL — ABNORMAL HIGH (ref 65–99)
Glucose-Capillary: 159 mg/dL — ABNORMAL HIGH (ref 65–99)
Glucose-Capillary: 167 mg/dL — ABNORMAL HIGH (ref 65–99)
Glucose-Capillary: 212 mg/dL — ABNORMAL HIGH (ref 65–99)
Glucose-Capillary: 173 mg/dL — ABNORMAL HIGH (ref 65–99)

## 2015-12-24 LAB — CULTURE, RESPIRATORY

## 2015-12-24 LAB — CULTURE, RESPIRATORY W GRAM STAIN

## 2015-12-24 SURGERY — INSERTION, PEG TUBE
Anesthesia: General

## 2015-12-24 MED ORDER — FENTANYL CITRATE (PF) 100 MCG/2ML IJ SOLN
INTRAMUSCULAR | Status: DC | PRN
  Administered 2015-12-24 (×4): 50 ug via INTRAVENOUS

## 2015-12-24 MED ORDER — NOREPINEPHRINE BITARTRATE 1 MG/ML IV SOLN
0.0000 ug/min | INTRAVENOUS | Status: DC
  Filled 2015-12-24 (×2): qty 4

## 2015-12-24 MED ORDER — MIDAZOLAM HCL 5 MG/5ML IJ SOLN
INTRAMUSCULAR | Status: AC
  Filled 2015-12-24: qty 5

## 2015-12-24 MED ORDER — DEXTROSE 50 % IV SOLN
INTRAVENOUS | Status: AC
  Filled 2015-12-24: qty 50

## 2015-12-24 MED ORDER — IOHEXOL 240 MG/ML SOLN
50.0000 mL | INTRAMUSCULAR | Status: AC
  Administered 2015-12-24 (×2): 50 mL via ORAL

## 2015-12-24 MED ORDER — BARIUM SULFATE 2.1 % PO SUSP
450.0000 mL | Freq: Once | ORAL | Status: AC
  Administered 2015-12-25: 450 mL via NASOGASTRIC

## 2015-12-24 MED ORDER — IOHEXOL 300 MG/ML  SOLN
100.0000 mL | Freq: Once | INTRAMUSCULAR | Status: AC | PRN
  Administered 2015-12-24: 100 mL via INTRAVENOUS

## 2015-12-24 MED ORDER — INSULIN ASPART 100 UNIT/ML ~~LOC~~ SOLN
2.0000 [IU] | SUBCUTANEOUS | Status: DC
  Administered 2015-12-24: 4 [IU] via SUBCUTANEOUS
  Administered 2015-12-24: 6 [IU] via SUBCUTANEOUS
  Administered 2015-12-25 (×2): 4 [IU] via SUBCUTANEOUS
  Administered 2015-12-25: 2 [IU] via SUBCUTANEOUS
  Administered 2015-12-25 – 2015-12-26 (×7): 4 [IU] via SUBCUTANEOUS
  Filled 2015-12-24 (×6): qty 4
  Filled 2015-12-24: qty 6
  Filled 2015-12-24 (×2): qty 4
  Filled 2015-12-24: qty 2
  Filled 2015-12-24 (×2): qty 4

## 2015-12-24 MED ORDER — MIDAZOLAM HCL 5 MG/ML IJ SOLN
INTRAMUSCULAR | Status: DC | PRN
  Administered 2015-12-24: 1 mg via INTRAVENOUS
  Administered 2015-12-24 (×3): 2 mg via INTRAVENOUS

## 2015-12-24 MED ORDER — DEXTROSE 50 % IV SOLN
15.0000 mL | Freq: Once | INTRAVENOUS | Status: AC
  Administered 2015-12-24: 15 mL via INTRAVENOUS

## 2015-12-24 MED ORDER — FENTANYL CITRATE (PF) 100 MCG/2ML IJ SOLN
INTRAMUSCULAR | Status: AC
  Filled 2015-12-24: qty 4

## 2015-12-24 NOTE — Progress Notes (Signed)
Spoke to Dr. Dahlia Byes- he stated that he saw the results of the CT scan and he spoke to the son.  No new orders at this time.

## 2015-12-24 NOTE — Consult Note (Signed)
Patient ID: Stephen Camacho, male   DOB: 03/19/1946, 70 y.o.   MRN: [id number]  HPI [name] is a 70 y.o. male   HPI  To see patient by Dr. Rayann Heman from GI regarding free air after attempted PEG placement. Disposition history is very complex and extensive. He is a 70 y.o. 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 He underwent thrombectomy and eventually ended abdomen with a left above-the-knee amputation for gangrene. He has already had a right above-the-knee amputation the distant past. His postoperative course has been rocky and complicated including non-STEMI, new stroke and minimal responsiveness on his mental status. He has been dealing with prolonged respiratory failure and persistent febrile illness on multiple antibiotics and with different cultures.  The family was having some second thoughts regarding further care but finally decided go ahead with PEG placement and a tracheostomy. Patient underwent an attempted PEG placement via Dr. Rayann Heman and a half personally discussed the case with pain he was unable to get full transillumination and but was able to place a needle unfortunately he never saw the needle into the stomach. The procedure was aborted and follow-up KUB show some free air in between the small bowel.  Patient exam is very unreliable because he is sedated and he had a stroke so his mental status is compromised also he has been spiking fevers and being tachycardic with an increased white count evening for the attempted PEG placement. I have attempted  to contact the son and the family on multiple occasions since afternoon and have been UNSUCCESSFUL   Past Medical History  Diagnosis Date  . Hyperlipidemia   . Hypertension   . Diabetes mellitus ([place])   . CAD (coronary artery disease)     a. s/p 2 v CABG in 2012 (LIMA-LAD & SVG-OM); b. cath 07/2012 s/p PCI/DES to ostial LCx and OM  . PAD (peripheral artery disease) ([place])     a. s/p right SFA stent; b. s/p right toe amputation 2011; c. s/p right AKA spring 2016  . Seizures ([place])   . Gangrene of foot ([place])   . Wheezing   . Lung cancer ([place])   . Prostate cancer ([place])   . Stroke [facility])     Past Surgical History  Procedure Laterality Date  . Right aka  07-10-2014  . Left femoral popliteal bypass    . Right lung lobeectomy    . Coronary artery bypass graft    . Peripheral vascular catheterization Left 12/06/2015    Procedure: Lower Extremity Angiography;  Surgeon: [name], MD;  Location: [place] [facility];  Service: Cardiovascular;  Laterality: Left;  . Peripheral vascular catheterization Left 12/06/2015    Procedure: Lower Extremity Intervention;  Surgeon: [name], MD;  Location: [place] [facility];  Service: Cardiovascular;  Laterality: Left;  . Peripheral vascular catheterization N/A 12/05/2015    Procedure: Lower Extremity Angiography;  Surgeon: [name], MD;  Location: [place] [facility];  Service: Cardiovascular;  Laterality: N/A;  . Peripheral vascular catheterization  12/05/2015  Procedure: Lower Extremity Intervention;  Surgeon: [name], MD;  Location: [place] [facility];  Service: Cardiovascular;;  . Amputation Left 12/12/2015    Procedure: AMPUTATION ABOVE KNEE;  Surgeon: [name], MD;  Location: [facility] OR[facility];  Service: Vascular;  Laterality: Left;    Family History  Problem Relation Age of Onset  . Family history unknown: Yes    Social History Social History  Substance Use Topics  . Smoking status: Never Smoker   . Smokeless  tobacco: None  . Alcohol Use: No    No Known Allergies  Current Facility-Administered Medications  Medication Dose Route Frequency Provider Last Rate Last Dose  . acetaminophen (TYLENOL) tablet 650 mg  650 mg Per Tube Q6H PRN Mikael Spray, NP   650 mg at 12/24/15 1020  . amiodarone (PACERONE) tablet 200 mg  200 mg Per Tube Daily Wilhelmina Mcardle, MD   200 mg at 12/24/15 1020  . anidulafungin (ERAXIS) 100 mg in sodium chloride 0.9 % 100 mL IVPB  100 mg Intravenous Q24H Adrian Prows, MD   100 mg at 12/24/15 1730  . antiseptic oral rinse solution (CORINZ)  7 mL Mouth Rinse 10 times per day Wilhelmina Mcardle, MD   7 mL at 12/24/15 1636  . aspirin chewable tablet 162 mg  162 mg Per Tube Daily Wilhelmina Mcardle, MD   162 mg at 12/24/15 1020  . atorvastatin (LIPITOR) tablet 40 mg  40 mg Oral q1800 Flora Lipps, MD   40 mg at 12/24/15 1730  . [START ON 12/25/2015] Barium Sulfate 2.1 % SUSP 450 mL  450 mL Nasogastric Once Inez Catalina, MD      . bisacodyl (DULCOLAX) suppository 10 mg  10 mg Rectal Daily PRN Wilhelmina Mcardle, MD      . chlorhexidine gluconate (PERIDEX) 0.12 % solution 15 mL  15 mL Mouth Rinse BID Wilhelmina Mcardle, MD   15 mL at 12/24/15 0816  . fentaNYL (SUBLIMAZE) 100 MCG/2ML injection           . fentaNYL 2553mg in NS 2559m(1045mml) infusion-PREMIX  25-400 mcg/hr Intravenous Continuous MicTanda RockersD 20 mL/hr at 12/24/15 0807 200 mcg/hr at 12/24/15 0807  . heparin injection 5,000 Units  5,000 Units Subcutaneous Q12H VanSerafina MitchellD   Stopped at 12/24/15 1013  . ibuprofen (ADVIL,MOTRIN) 100 MG/5ML suspension 400 mg  400 mg Per Tube Q6H PRN MagMikael SprayP   400 mg at 12/21/15 1143  . insulin aspart (novoLOG) injection 2-6 Units  2-6 Units Subcutaneous 6 times per day KurFlora LippsD   6 Units at 12/24/15 1635  . ipratropium-albuterol (DUONEB) 0.5-2.5 (3) MG/3ML nebulizer solution 3 mL  3 mL Nebulization Q4H PRN PraLaverle HobbyD   3 mL at 12/15/15 2216  .  midazolam (VERSED) 5 MG/5ML injection           . midazolam (VERSED) 5 MG/5ML injection           . midazolam (VERSED) injection 2-5 mg  2-5 mg Intravenous Q1H PRN MicTanda RockersD   4 mg at 12/22/15 0815  . norepinephrine (LEVOPHED) 4 mg in dextrose 5 % 250 mL (0.016 mg/mL) infusion  0-40 mcg/min Intravenous Titrated KurFlora LippsD      . ondansetron (ZOFRAN) tablet 4 mg  4 mg Oral Q6H PRN GreKatha CabalD       Or  . ondansetron (ZOAmerican [facility] Center [facility] Novamednjection 4 mg  4 mg Intravenous  Q6H PRN [name], MD      . pantoprazole sodium (PROTONIX) 40 mg/20 mL oral suspension 40 mg  40 mg Per Tube Daily Charlett Nose, RPH   40 mg at 12/24/15 1021  . piperacillin-tazobactam (ZOSYN) IVPB 3.375 g  3.375 g Intravenous 3 times per day Charlett Nose, RPH   3.375 g at 12/24/15 1443  . polyethylene glycol (MIRALAX / GLYCOLAX) packet 17 g  17 g Oral Daily PRN Mikael Spray, NP      . sennosides (SENOKOT) 8.8 MG/5ML syrup 5 mL  5 mL Per Tube BID PRN Wilhelmina Mcardle, MD      . vancomycin (VANCOCIN) IVPB 1000 mg/200 mL premix  1,000 mg Intravenous Q18H [name], MD   1,000 mg at 12/24/15 1730     Review of Systems A 10 point review of systems was asked and was negative except for the information on the HPI  Physical Exam Blood pressure 105/66, pulse 98, temperature 101.2 F (38.4 C), temperature source Rectal, resp. rate 17, height '6\' 4"'$  (1.93 m), weight 60.2 kg (132 lb 11.5 oz), SpO2 100 %. CONSTITUTIONAL: He is sedated Neuro: Not following commands has some opening of the eyes but don't think that it's voluntarily. He is on the ventilator Chest: Some rales bilaterally Cardiac: Sinus tachycardia with no murmurs Abd: Soft but distended he seems to be uncomfortable on palpation of the abdomen but again it's impossible to have a good exam due to the sedation and his previous stroke with minimal neurological response Ext: Bilateral stumps from the AKA healing okay he does have groin  incisions are healing well.   Data Reviewed Duration of white count with an acute kidney injury. Free air on plain x-ray I have personally reviewed the patient's imaging, laboratory findings and medical records.    Assessment/Plan  Debilitated male with significant comorbidities including coronary artery disease with a recent non-STEMI history of stent and CABG before new and recent stroke, respiratory failure in the setting of COPD and a previous lobectomy on the right side for cancer. Now with a possible viscous perforation based on a plain KUB. This case doses definitive ethical and medical challenges. #1 have not been able to contact the family and #2 any major surgical intervention in this critically ill patient will carry prohibitive morbidity and have very high mortality. I do not think that he will recover from this injury if in fact he has a perforation. I think I am major surgical intervention will be futile and actually may increase his suffering. Will be on standby for an order CT scan of the abdomen and pelvis and hopefully we'll be able to explain this to the family and in my personal opinion and judgment he might be better served with comfort care.  Again this is a very complicated case for this unfortunate gentleman. Please note that I have personally reviewed extensive medical records and imaging studies I have discussed the case with his primary gastroenterologist  Dr Rayann Heman.  Caroleen Hamman, MD FACS General Surgeon 12/24/2015, 5:33 PM

## 2015-12-24 NOTE — Progress Notes (Signed)
PULMONARY / CRITICAL CARE MEDICINE   Name: Stephen Camacho MRN: [id number] DOB: 05/22/1946    ADMISSION DATE:  12/05/2015 CONSULTATION DATE:  12/08/15  History of Present Illness:   69 YO male with 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 Now with acute respiratory failure, intubated due to pneumonia. Underwent a left AKA   SUBJECTIVE:  Sedated on the vent. Increased WOB on vent, on Fentanyl gtt and versed started;on zosyn/Vanc/antfungal therapy. on antifungal therapy Remains sedated and critically ill, awaiting Trach and PEG tube placement Family updated yesterday  EVENTS/DATA: 02/10 >> LLE angiogram, thrombolysis, thrombectomy, angioplasty 02/11 Intubated for suspected PNA 02/11 elevated trop I. Pk trop I 62.6 on 02/20 02/12 CT head: New nonhemorrhagic infarcts involving the right PCA territory and bilateral cerebellum, left greater than right 02/17 L AKA 02/18>extubated 02/21 TTE: LVEF 55-60% 02/21 RUE [place]: no DVT 02/21>Re-intubated 02/22 >CT head: Interval evolution of bilateral cerebellar and right PCA territory infarcts 02/27-remains intubated,discussion with family-proceed with trach and PEG  INDWELLING DEVICES:: ETT 02/11 >> 02/18, 02/21 >>  R IJ CVL 02/10 >> removed PICC line RT  MICRO DATA: MRSA PCR 02/13 >> NEG Urine 02/12 >> NEG Resp 02/11 >> MSSA C diff 02/16 >> NEG Blood 02/12 >> NEG Urine 02/19 >> NEG C diff 02/21 >> NEG Cath tip 02/19 >> NEG Resp 02/18 >> light growth candida Urine 02/21 >> NEG MRSA PCR 02/22 >> NEG Respiratory 02/26>>  ANTIMICROBIALS:  Vanc 02/11 >> 02/23 Pip-tazo 02/11 >> 02/20 Ceftaz 02/21 >> 02/24 Metronidazole 02/21 >> 02/24 Pip-tazo 02/24 >>  Vancomycin 02/25>>   VITAL SIGNS: BP 113/69 mmHg  Pulse 103  Temp(Src)  100.8 F (38.2 C) (Rectal)  Resp 15  Ht '6\' 4"'$  (1.93 m)  Wt 132 lb 11.5 oz (60.2 kg)  BMI 16.16 kg/m2  SpO2 100%  HEMODYNAMICS:    VENTILATOR SETTINGS: Vent Mode:  [-] PRVC FiO2 (%):  [30 %] 30 % Set Rate:  [15 bmp] 15 bmp Vt Set:  [500 mL] 500 mL PEEP:  [5 cmH20] 5 cmH20 Plateau Pressure:  [13 cmH20] 13 cmH20  INTAKE / OUTPUT: I/O last 3 completed shifts: In: 4550.8 [I.V.:817.6; NG/GT:2703.2; IV Piggyback:1030] Out: 3905 [Urine:3775; Stool:130]  PHYSICAL EXAMINATION: General: Chronically ill appearing;+resp distress Neuro: CNs intact, MAEs, agitated on WUA HEENT: NCAT, ETT in place Cardiovascular: RRR, S1/S2, no MRG Lungs: Bilateral airflow, no wheezes Abdomen: Soft, NT, +BS Ext: B AKA, L surgical wound clean and dry  LABS:  BMET  Recent Labs Lab 12/21/15 0419 12/22/15 0204 12/22/15 1036 12/23/15 0533  NA 145 146*  --  147*  K 3.9 3.2* 3.6 3.3*  CL 116* 114*  --  113*  CO2 24 23  --  27  BUN 36* 37*  --  45*  CREATININE 1.35* 1.34*  --  1.19  GLUCOSE 285* 281*  --  225*    Electrolytes  Recent Labs Lab 12/20/15 0542 12/21/15 0419 12/22/15 0204 12/22/15 0819 12/23/15 0533  CALCIUM 6.8* 7.2* 7.4*  --  7.7*  MG 1.8  --  2.2  --  1.9  PHOS 2.3* 2.4* 2.4* 3.5 2.7    CBC  Recent Labs Lab 12/21/15 0419 12/22/15 0204 12/23/15 0533  WBC 20.3* 22.6* 18.5*  HGB 8.4* 8.8* 7.6*  HCT 27.2* 27.8* 24.3*  PLT 309 348 396    Coag's No results  for input(s): APTT, INR in the last 168 hours.  Sepsis Markers  Recent Labs Lab 12/18/15 0526  PROCALCITON 1.84    ABG  Recent Labs Lab 12/18/15 0500 12/22/15 0850  PHART 7.39 7.40  PCO2ART 45 40  PO2ART 81* 67*    Liver Enzymes No results for input(s): AST, ALT, ALKPHOS, BILITOT, ALBUMIN in the last 168 hours.  Cardiac Enzymes  Recent Labs Lab 12/21/15 0419  TROPONINI 0.28*    Glucose  Recent Labs Lab 12/24/15 0126 12/24/15 0315 12/24/15 0525 12/24/15 0549 12/24/15 0751  12/24/15 0858  GLUCAP 213* 137* 62* 87 120* 127*   No results found. 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 Now with acute respiratory failure, intubated due to pneumonia. Underwent a left AKA    PULMONARY A: Prolonged VDRF Failed extubation attempt Bilateral pulmonary infiltrates and fever- c/w  PNA P:   Cont vent support -  Cont vent bundle Daily SBT if/when meets criteria Antibiotics as above  CXR in am Repeat respiratory cultures  Will need trach for survival-ENT consulted  CARDIOVASCULAR A:  NSTEMI PAF, NSR presently NSVT P:  MAP goal > 65 mmHg Cont ASA Continue amiodarone  Hemodynamics per ICU protocol  RENAL A:   AKI, nonoliguric, creatinine stable at 1.35 Very poor candidate for HD P:   Monitor BMET intermittently Monitor I/Os Correct electrolytes as indicated Increase free water flushes  GASTROINTESTINAL A:   No issues P:   SUP: enteral PPI Cont TFs as tolerated -needs PEG tube-Gi consult placed  HEMATOLOGIC A:   ICU acquired anemia without acute blood loss P:  DVT px: SQ heparin Monitor CBC intermittently Transfuse per usual guidelines   INFECTIOUS A:   Severe sepsis-persistent leukocytosis and fever LLE gangrene Aspiration PNA P:   -Vanco and zosyn, antifungal therapy -Repeat respirtory cultures and f/u pending culture - tylenol and motrin prn for fever >103 -follow up ID recs  ENDOCRINE A:   Type 2 DM-Persistent hyperglycemia P:   -started insulin infusion Blood gluocose monitoring per ICU protocol  NEUROLOGIC A:   Multifocal CVAs, acute Acute encewphalopathy H/O seizures - not on chronic AEDs PTA P:   RASS goal: -1,  -2 Fentanyl gtt and prn versed for vent sedation       The Patient requires high complexity decision making for assessment and support, frequent evaluation and titration of therapies, application of advanced monitoring technologies and extensive interpretation of multiple databases. Critical Care Time devoted to patient care services described in this note is 40 minutes.   Overall, patient is critically ill, prognosis is guarded.  Patient with Multiorgan failure and at high risk for cardiac arrest and death.    Corrin Parker, M.D.  [facility]  Medical Director [place] Director [facility] Cardio-Pulmonary Department

## 2015-12-24 NOTE — Progress Notes (Signed)
RT transported patient to and from CT on trilogy vent without incident, upon arrival to ICU 1 placed patient back on servo vent.

## 2015-12-24 NOTE — Progress Notes (Signed)
Spoke to Dr. Rayann Heman about results of patient's abdominal xray- he stated he would place orders for CT scan of abdomen. Also keep patient NPO.

## 2015-12-24 NOTE — H&P (Signed)
Primary Care Physician:  Pcp Not In System  Pre-Procedure History & Physical: HPI:  Stephen Camacho is a 70 y.o. male is here for an EGD with PEG placement.   Past Medical History  Diagnosis Date  . Hyperlipidemia   . Hypertension   . Diabetes mellitus ([place])   . CAD (coronary artery disease)     a. s/p 2 v CABG in 2012 (LIMA-LAD & SVG-OM); b. cath 07/2012 s/p PCI/DES to ostial LCx and OM  . PAD (peripheral artery disease) ([place])     a. s/p right SFA stent; b. s/p right toe amputation 2011; c. s/p right AKA spring 2016  . Seizures ([place])   . Gangrene of foot ([place])   . Wheezing   . Lung cancer ([place])   . Prostate cancer ([place])   . Stroke [facility])     Past Surgical History  Procedure Laterality Date  . Right aka  07-10-2014  . Left femoral popliteal bypass    . Right lung lobeectomy    . Coronary artery bypass graft    . Peripheral vascular catheterization Left 12/06/2015    Procedure: Lower Extremity Angiography;  Surgeon: [name], MD;  Location: [place] CV [facility];  Service: Cardiovascular;  Laterality: Left;  . Peripheral vascular catheterization Left 12/06/2015    Procedure: Lower Extremity Intervention;  Surgeon: [name], MD;  Location: [place] [facility];  Service: Cardiovascular;  Laterality: Left;  . Peripheral vascular catheterization N/A 12/05/2015    Procedure: Lower Extremity Angiography;  Surgeon: [name], MD;  Location: [place] [facility];  Service: Cardiovascular;  Laterality: N/A;  . Peripheral vascular catheterization  12/05/2015    Procedure: Lower Extremity Intervention;  Surgeon: [name], MD;  Location: [place] CV [facility];  Service: Cardiovascular;;  . Amputation Left 12/12/2015    Procedure: AMPUTATION ABOVE KNEE;  Surgeon: [name], MD;  Location: [facility] ORS;  Service: Vascular;  Laterality: Left;    Prior to Admission medications   Medication Sig Start Date End Date Taking? Authorizing Provider  acetaminophen (TYLENOL)  650 MG CR tablet Take 650 mg by mouth every 8 (eight) hours as needed for pain.   Yes Historical Provider, MD  aspirin 81 MG chewable tablet Chew 81 mg by mouth daily.   Yes Historical Provider, MD  atorvastatin (LIPITOR) 40 MG tablet Take 40 mg by mouth daily at 6 PM.   Yes Historical Provider, MD  cilostazol (PLETAL) 50 MG tablet Take 50 mg by mouth 2 (two) times daily.   Yes Historical Provider, MD  insulin glargine (LANTUS) 100 UNIT/ML injection Inject 10 Units into the skin at bedtime.   Yes Historical Provider, MD  isosorbide mononitrate (IMDUR) 60 MG 24 hr tablet Take 60 mg by mouth daily.   Yes Historical Provider, MD  lisinopril (PRINIVIL,ZESTRIL) 10 MG tablet Take 10 mg by mouth daily.   Yes Historical Provider, MD  meclizine (ANTIVERT) 25 MG tablet Take 1 tablet (25 mg total) by mouth 3 (three) times daily as needed for dizziness. 10/27/15  Yes Daymon Larsen, MD  metFORMIN (GLUCOPHAGE) 500 MG tablet Take by mouth 2 (two) times daily with a meal.   Yes Historical Provider, MD  metoprolol (LOPRESSOR) 50 MG tablet Take 50 mg by mouth 2 (two) times daily.   Yes Historical Provider, MD  nitroGLYCERIN (NITROSTAT) 0.4 MG SL tablet Place 0.4 mg under the tongue every 5 (five) minutes as needed for chest pain.   Yes Historical Provider, MD  ondansetron (ZOFRAN) 4 MG tablet Take 1 tablet (4 mg total) by mouth every 8 (eight) hours as needed for nausea or vomiting. 10/27/15  Yes Daymon Larsen, MD  rivaroxaban (XARELTO) 20 MG TABS tablet Take 20 mg by mouth daily with supper.   Yes Historical Provider, MD  traMADol (ULTRAM) 50 MG tablet Take 25-50 mg by mouth every 6 (six) hours as needed for severe pain.    Yes Historical Provider, MD    Allergies as of 12/04/2015  . (No Known Allergies)    Family History  Problem Relation Age of Onset  . Family history unknown: Yes    Social History   Social History  . Marital Status: Widowed    Spouse Name: N/A  . Number of Children: N/A  . Years of  Education: N/A   Occupational History  . Not on file.   Social History Main Topics  . Smoking status: Never Smoker   . Smokeless tobacco: Not on file  . Alcohol Use: No  . Drug Use: Not on file  . Sexual Activity: Not on file   Other Topics Concern  . Not on file   Social History Narrative     Physical Exam: BP 106/66 mmHg  Pulse 113  Temp(Src) 101.7 F (38.7 C) (Rectal)  Resp 20  Ht '6\' 4"'$  (1.93 m)  Wt 60.2 kg (132 lb 11.5 oz)  BMI 16.16 kg/m2  SpO2 100% General:   Intubated, sedated Head:  Normocephalic and atraumatic. Neck:  Supple; no masses or thyromegaly. Lungs:  Decreased bilat.    Heart:  Regular rate and rhythm. Abdomen:  Soft, nontender and nondistended. Normal bowel sounds, without guarding, and without rebound.     Impression/Plan: Stephen Camacho is here for an EGD with PEG to be performed for faiure to wean off vent, unable to swallow  Risks, benefits, limitations, and alternatives regarding  EGD with PEG have been reviewed with the [facility] daughter..  Questions have been answered.  All parties agreeable. I personally discussed the risks and benefits with the [facility] and she is in agreement with going ahead with the procedure. Risks include infection, bleeding, organ perforation requiring surgery, hitting another organ, and sedation complications including death.   Josefine Class, MD  12/24/2015, 11:23 AM

## 2015-12-24 NOTE — Op Note (Signed)
[facility] Gastroenterology Patient Name: Stephen Camacho Procedure Date: 12/24/2015 11:05 AM MRN: [id number] Account #: [id number] Date of Birth: Aug 15, 1946 Admit Type: Inpatient Age: 70 Room: [facility] ENDO ROOM 2 Gender: Male Note Status: Finalized Procedure:            Upper GI endoscopy Indications:          Place PEG due to impaired swallowing, Place PEG because                        patient requires ventilator support, Place PEG to                        improve nutrition in patient with prolonged severe                        illness Patient Profile:      This is a 70 year old male. Providers:            Gerrit Heck. Rayann Heman, MD Referring MD:         No Local Md, MD (Referring MD) Medicines:            Fentanyl 200 micrograms IV, Midazolam 7 mg IV Complications:        No immediate complications. Procedure:            Pre-Anesthesia Assessment:                       - Prior to the procedure, a History and Physical was                        performed, and patient medications, allergies and                        sensitivities were reviewed. The patient's tolerance of                        previous anesthesia was reviewed.                       After obtaining informed consent, the endoscope was                        passed under direct vision. Throughout the procedure,                        the patient's blood pressure, pulse, and oxygen                        saturations were monitored continuously. The Endoscope                        was introduced through the mouth, and advanced to the                        second part of duodenum. Findings:      The esophagus was normal.      Moderate inflammation characterized by erythema was found in the gastric       body.      The examined duodenum was normal.      Placement of an externally removable PEG was attempted but could not  be       successfully completed. Finger indendation was poor and tranilluminaton       was only observed transiently. The skin was injected with lidocaine and       the trocar was inserted. However, the trocar was unable to be advanced       into the stomach. Estimated blood loss: none. Impression:           - Normal esophagus.                       - Gastritis.                       - Normal examined duodenum.                       - An externally removable PEG placement was attempted                        but could not be successfully completed. Finger                        indentation and transillumination were poor and trocar                        could not be advanced into stomach. ? due to gaseous                        distension of bowel.                       - Unable to place G tube endoscopically.                       - No specimens collected. Recommendation:       - Return patient to ICU for ongoing care.                       - Resume tube feeds.                       - KUB today                       - Possible attempt at G tube placement by                        interventional radiology. If they are uable to perform,                        will require surgical consult. Procedure Code(s):    --- Professional ---                       [phone], 52,59, Esophagogastroduodenoscopy, flexible,                        transoral; with directed placement of percutaneous                        gastrostomy tube Diagnosis Code(s):    --- Professional ---  K29.70, Gastritis, unspecified, without bleeding                       R13.10, Dysphagia, unspecified                       Z43.1, Encounter for attention to gastrostomy                       Z99.11, Dependence on respirator [ventilator] status                       E63.9, Nutritional deficiency, unspecified CPT copyright 2016 American [employer] Association. All rights reserved. The codes documented in this report are preliminary and upon coder review may  be revised to meet current compliance  requirements. Mellody Life, MD 12/24/2015 1:00:41 PM This report has been signed electronically. Number of Addenda: 0 Note Initiated On: 12/24/2015 11:05 AM      [facility]

## 2015-12-24 NOTE — Progress Notes (Signed)
Stephen Camacho Date of Admission:  12/05/2015     ID: Stephen Camacho is a 70 y.o. male with Active Problems:   Ischemic leg   Acute respiratory failure (HCC)   Ischemia of lower extremity   Altered mental status   Hematoma of neck   Occlusion of arterial bypass graft (HCC)   NSTEMI (non-ST elevated myocardial infarction) (HCC)   Severe sepsis (HCC)   Urinary retention   Aspiration into respiratory tract   Encounter for intubation   Pressure ulcer   Subjective: Remains intubated Fevers recurred,  Unsuccessful peg placement  ROS  Unable to obtain  Medications:  Antibiotics Given (last 72 hours)    Date/Time Action Medication Dose Rate   12/21/15 1716 Given   vancomycin (VANCOCIN) IVPB 750 mg/150 ml premix 750 mg 150 mL/hr   12/21/15 2212 Given   piperacillin-tazobactam (ZOSYN) IVPB 3.375 g 3.375 g 12.5 mL/hr   12/22/15 1026 Given   vancomycin (VANCOCIN) IVPB 750 mg/150 ml premix 750 mg 150 mL/hr   12/22/15 1413 Given   piperacillin-tazobactam (ZOSYN) IVPB 3.375 g 3.375 g 12.5 mL/hr   12/22/15 2312 Given   piperacillin-tazobactam (ZOSYN) IVPB 3.375 g 3.375 g 12.5 mL/hr   12/23/15 0408 Given   vancomycin (VANCOCIN) IVPB 750 mg/150 ml premix 750 mg 150 mL/hr   12/23/15 3614 Given   piperacillin-tazobactam (ZOSYN) IVPB 3.375 g 3.375 g 12.5 mL/hr   12/23/15 1420 Given   piperacillin-tazobactam (ZOSYN) IVPB 3.375 g 3.375 g 12.5 mL/hr   12/23/15 2128 Given   piperacillin-tazobactam (ZOSYN) IVPB 3.375 g 3.375 g 12.5 mL/hr   12/23/15 2359 Given   vancomycin (VANCOCIN) IVPB 1000 mg/200 mL premix 1,000 mg 200 mL/hr   12/24/15 0540 Given   piperacillin-tazobactam (ZOSYN) IVPB 3.375 g 3.375 g 12.5 mL/hr   12/24/15 1443 Given   piperacillin-tazobactam (ZOSYN) IVPB 3.375 g 3.375 g 12.5 mL/hr     . amiodarone  200 mg Per Tube Daily  . anidulafungin  100 mg Intravenous Q24H  . antiseptic oral rinse  7 mL Mouth Rinse 10 times per day  . aspirin   162 mg Per Tube Daily  . atorvastatin  40 mg Oral q1800  . [START ON 12/25/2015] Barium Sulfate  450 mL Nasogastric Once  . chlorhexidine gluconate  15 mL Mouth Rinse BID  . fentaNYL      . heparin subcutaneous  5,000 Units Subcutaneous Q12H  . insulin aspart  2-6 Units Subcutaneous 6 times per day  . iohexol  50 mL Oral Q1 Hr x 2  . midazolam      . midazolam      . pantoprazole sodium  40 mg Per Tube Daily  . piperacillin-tazobactam (ZOSYN)  IV  3.375 g Intravenous 3 times per day  . vancomycin  1,000 mg Intravenous Q18H    Objective: Vital signs in last 24 hours: Temp:  [99.9 F (37.7 C)-101.7 F (38.7 C)] 100.9 F (38.3 C) (03/01 1145) Pulse Rate:  [84-117] 98 (03/01 1300) Resp:  [14-27] 17 (03/01 1400) BP: (92-124)/(55-69) 105/66 mmHg (03/01 1400) SpO2:  [90 %-100 %] 100 % (03/01 1515) FiO2 (%):  [30 %] 30 % (03/01 1515) Weight:  [60.2 kg (132 lb 11.5 oz)] 60.2 kg (132 lb 11.5 oz) (03/01 0537) Constitutional: intubated, critically ill appearing HENT: anicteric Mouth/Throat: ETT in place R neck IJ -removed. PICC LUE Cardiovascular: Normal rate, regular rhythm and normal heart sounds. Pulmonary/Chest: mech BS Abdominal: Soft. Bowel sounds are normal. He exhibits no  distension. There is no tenderness.  Lymphadenopathy: He has no cervical adenopathy.  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 No rash noted. No erythema.  Psychiatric: intubated  Lab Results  Recent Labs  12/23/15 0533 12/24/15 1032  WBC 18.5* 18.5*  HGB 7.6* 7.6*  HCT 24.3* 25.0*  NA 147* 144  K 3.3* 4.3  CL 113* 112*  CO2 27 24  BUN 45* 38*  CREATININE 1.19 1.23    Microbiology: Results for orders placed or performed during the hospital encounter of 12/05/15  Culture, respiratory (NON-Expectorated)     Status: None   Collection Time: 12/06/15  4:05 PM  Result Value Ref Range Status   Specimen Description NASAL SWAB  Final   Special  Requests NONE  Final   Gram Stain RARE WBC SEEN FEW GRAM POSITIVE COCCI   Final   Culture HEAVY GROWTH STAPHYLOCOCCUS AUREUS  Final   Report Status 12/09/2015 FINAL  Final   Organism ID, Bacteria STAPHYLOCOCCUS AUREUS  Final      Susceptibility   Staphylococcus aureus - MIC*    CIPROFLOXACIN 2 INTERMEDIATE Intermediate     GENTAMICIN <=0.5 SENSITIVE Sensitive     OXACILLIN <=0.25 SENSITIVE Sensitive     TETRACYCLINE <=1 SENSITIVE Sensitive     VANCOMYCIN 1 SENSITIVE Sensitive     TRIMETH/SULFA <=10 SENSITIVE Sensitive     RIFAMPIN <=0.5 SENSITIVE Sensitive     Inducible Clindamycin NEGATIVE Sensitive     * HEAVY GROWTH STAPHYLOCOCCUS AUREUS  Culture, blood (Routine X 2) w Reflex to ID Panel     Status: None   Collection Time: 12/07/15 12:09 AM  Result Value Ref Range Status   Specimen Description BLOOD BLOOD RIGHT HAND  Final   Special Requests BOTTLES DRAWN AEROBIC AND ANAEROBIC 10CC  Final   Culture NO GROWTH 6 DAYS  Final   Report Status 12/13/2015 FINAL  Final  Culture, blood (Routine X 2) w Reflex to ID Panel     Status: None   Collection Time: 12/07/15 12:09 AM  Result Value Ref Range Status   Specimen Description BLOOD BLOOD LEFT HAND  Final   Special Requests BOTTLES DRAWN AEROBIC AND ANAEROBIC 10CC  Final   Culture NO GROWTH 6 DAYS  Final   Report Status 12/13/2015 FINAL  Final  Urine culture     Status: None   Collection Time: 12/07/15 12:11 AM  Result Value Ref Range Status   Specimen Description URINE, CLEAN CATCH  Final   Special Requests NONE  Final   Culture NO GROWTH 1 DAY  Final   Report Status 12/08/2015 FINAL  Final  MRSA PCR Screening     Status: None   Collection Time: 12/08/15  9:12 AM  Result Value Ref Range Status   MRSA by PCR NEGATIVE NEGATIVE Final    Comment:        The GeneXpert MRSA Assay (FDA approved for NASAL specimens only), is one component of a comprehensive MRSA colonization surveillance program. It is not intended to diagnose  MRSA infection nor to guide or monitor treatment for MRSA infections.   C difficile quick scan w PCR reflex     Status: None   Collection Time: 12/11/15  1:54 PM  Result Value Ref Range Status   C Diff antigen NEGATIVE NEGATIVE Final   C Diff toxin NEGATIVE NEGATIVE Final   C Diff interpretation Negative for C. difficile  Final  Culture, blood (Routine  X 2) w Reflex to ID Panel     Status: None   Collection Time: 12/13/15  9:33 AM  Result Value Ref Range Status   Specimen Description BLOOD LEFT ANTECUBITAL  Final   Special Requests BOTTLES DRAWN AEROBIC AND ANAEROBIC  4CC  Final   Culture NO GROWTH 5 DAYS  Final   Report Status 12/18/2015 FINAL  Final  Culture, blood (Routine X 2) w Reflex to ID Panel     Status: None   Collection Time: 12/13/15  9:41 AM  Result Value Ref Range Status   Specimen Description BLOOD RIGHT ARM  Final   Special Requests BOTTLES DRAWN AEROBIC AND ANAEROBIC  2CC  Final   Culture NO GROWTH 5 DAYS  Final   Report Status 12/18/2015 FINAL  Final  Urine culture     Status: None   Collection Time: 12/13/15 11:01 AM  Result Value Ref Range Status   Specimen Description URINE, CATHETERIZED  Final   Special Requests NONE  Final   Culture NO GROWTH 1 DAY  Final   Report Status 12/14/2015 FINAL  Final  Culture, expectorated sputum-assessment     Status: None   Collection Time: 12/13/15 11:30 AM  Result Value Ref Range Status   Specimen Description TRACHEAL ASPIRATE  Final   Special Requests NONE  Final   Sputum evaluation THIS SPECIMEN IS ACCEPTABLE FOR SPUTUM CULTURE  Final   Report Status 12/13/2015 FINAL  Final  Culture, respiratory (NON-Expectorated)     Status: None   Collection Time: 12/13/15 11:30 AM  Result Value Ref Range Status   Specimen Description TRACHEAL ASPIRATE  Final   Special Requests NONE Reflexed from [id number]  Final   Gram Stain   Final    FEW WBC SEEN FEW GRAM POSITIVE COCCI FEW YEAST FAIR SPECIMEN - 70-80% WBCS    Culture LIGHT  GROWTH CANDIDA ALBICANS  Final   Report Status 12/17/2015 FINAL  Final  Cath Tip Culture     Status: None   Collection Time: 12/14/15 12:47 PM  Result Value Ref Range Status   Specimen Description CATH TIP  Final   Special Requests NONE  Final   Culture NO GROWTH 3 DAYS  Final   Report Status 12/17/2015 FINAL  Final  Urine culture     Status: None   Collection Time: 12/16/15  8:19 AM  Result Value Ref Range Status   Specimen Description URINE, RANDOM  Final   Special Requests Normal  Final   Culture NO GROWTH 1 DAY  Final   Report Status 12/17/2015 FINAL  Final  CULTURE, BLOOD (ROUTINE X 2) w Reflex to PCR ID Panel     Status: None   Collection Time: 12/16/15  8:28 AM  Result Value Ref Range Status   Specimen Description BLOOD LEFT ARM  Final   Special Requests BOTTLES DRAWN AEROBIC AND ANAEROBIC 10ML  Final   Culture NO GROWTH 5 DAYS  Final   Report Status 12/21/2015 FINAL  Final  CULTURE, BLOOD (ROUTINE X 2) w Reflex to PCR ID Panel     Status: None   Collection Time: 12/16/15  8:45 AM  Result Value Ref Range Status   Specimen Description BLOOD RIGHT ARM  Final   Special Requests BOTTLES DRAWN AEROBIC AND ANAEROBIC 10ML  Final   Culture NO GROWTH 5 DAYS  Final   Report Status 12/21/2015 FINAL  Final  C difficile quick scan w PCR reflex     Status: None   Collection  Time: 12/16/15  4:26 PM  Result Value Ref Range Status   C Diff antigen NEGATIVE NEGATIVE Final   C Diff toxin NEGATIVE NEGATIVE Final   C Diff interpretation Negative for C. difficile  Final  Culture, expectorated sputum-assessment     Status: None   Collection Time: 12/16/15  5:32 PM  Result Value Ref Range Status   Specimen Description SPUTUM  Final   Special Requests NONE  Final   Sputum evaluation THIS SPECIMEN IS ACCEPTABLE FOR SPUTUM CULTURE  Final   Report Status 12/16/2015 FINAL  Final  Culture, respiratory (NON-Expectorated)     Status: None   Collection Time: 12/16/15  5:32 PM  Result Value Ref  Range Status   Specimen Description SPUTUM  Final   Special Requests NONE Reflexed from [id number]  Final   Gram Stain   Final    MANY WBC SEEN RARE SQUAMOUS EPITHELIAL CELLS PRESENT FEW YEAST EXCELLENT SPECIMEN - 90-100% WBCS    Culture MODERATE GROWTH CANDIDA ALBICANS  Final   Report Status 12/20/2015 FINAL  Final  MRSA PCR Screening     Status: None   Collection Time: 12/17/15 11:50 AM  Result Value Ref Range Status   MRSA by PCR NEGATIVE NEGATIVE Final    Comment:        The GeneXpert MRSA Assay (FDA approved for NASAL specimens only), is one component of a comprehensive MRSA colonization surveillance program. It is not intended to diagnose MRSA infection nor to guide or monitor treatment for MRSA infections.   Culture, respiratory (NON-Expectorated)     Status: None   Collection Time: 12/18/15 12:15 AM  Result Value Ref Range Status   Specimen Description TRACHEAL ASPIRATE  Final   Special Requests NONE  Final   Gram Stain   Final    GOOD SPECIMEN - 80-90% WBCS RARE SQUAMOUS EPITHELIAL CELLS PRESENT MANY WBC SEEN FEW YEAST    Culture LIGHT GROWTH CANDIDA ALBICANS  Final   Report Status 12/21/2015 FINAL  Final  Urine culture     Status: None   Collection Time: 12/18/15 12:24 AM  Result Value Ref Range Status   Specimen Description URINE, RANDOM  Final   Special Requests NONE  Final   Culture NO GROWTH 1 DAY  Final   Report Status 12/19/2015 FINAL  Final  Culture, blood (Routine X 2) w Reflex to ID Panel     Status: None   Collection Time: 12/18/15  4:29 AM  Result Value Ref Range Status   Specimen Description BLOOD RIGHT HAND  Final   Special Requests BOTTLES DRAWN AEROBIC AND ANAEROBIC 5ML  Final   Culture NO GROWTH 5 DAYS  Final   Report Status 12/23/2015 FINAL  Final  Culture, blood (Routine X 2) w Reflex to ID Panel     Status: None   Collection Time: 12/18/15  4:37 AM  Result Value Ref Range Status   Specimen Description BLOOD LEFT ARM  Final   Special  Requests BOTTLES DRAWN AEROBIC AND ANAEROBIC 10ML  Final   Culture NO GROWTH 5 DAYS  Final   Report Status 12/23/2015 FINAL  Final  Culture, respiratory (NON-Expectorated)     Status: None   Collection Time: 12/21/15  9:29 AM  Result Value Ref Range Status   Specimen Description TRACHEAL ASPIRATE  Final   Special Requests NONE  Final   Gram Stain   Final    MODERATE WBC SEEN FEW YEAST GOOD SPECIMEN - 80-90% WBCS    Culture MODERATE  GROWTH CANDIDA ALBICANS  Final   Report Status 12/24/2015 FINAL  Final    Studies/Results: Dg Abd 1 View  12/24/2015  CLINICAL DATA:  Abdominal distention patient with history of prostate and lung carcinoma. Initial encounter. EXAM: ABDOMEN - 1 VIEW COMPARISON:  Single view of the abdomen 02/29/2017 and 12/14/2015. FINDINGS: There is visualization of both the inner and outer walls of multiple small bowel loops in the right upper quadrant of the abdomen compatible with a double wall sign as seen in free intraperitoneal air. Lucency in the right upper quadrant of the abdomen appears to be extraluminal air. Gas-filled but non dilated loops of bowel seen throughout the abdomen. IMPRESSION: Findings consistent with free intraperitoneal air. CT abdomen and pelvis with oral and IV contrast recommended for further evaluation. Critical Value/emergent results were called by telephone at the time of interpretation on 12/24/2015 at 1:30 pm to the patient's nurse, Eduard Clos, who verbally acknowledged these results. Electronically Signed   By: Inge Rise M.D.   On: 12/24/2015 13:34    Assessment/Plan: Assessment:  Kaileb Monsanto is a 70 y.o. male ith hx PAD, CAD, DM, admitted with L foot pain and found to have ischemia and occlusion of Fem Pop bypass. 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 Has had persistent fevers and leukocytosis.  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 Has been on abx since admit with zosyn and vanco from 2/11- 2/19. Was off abx for one day 2/20  there restarted ceftazidime and vanco. Cultures with sputum growing MSSA from 2/11 otherwise candida on trach aspirate.  Amputation site looks very good so doubt source of elevated wbc  IJ line removed, Picc place. May have perfed bowel . Remains febrile, not on pressors.  Recommendations Cont zosyn, vanco and eraxis Check CT abd   Thank you very much for the consult. Will follow with you.  [place], [place]   12/24/2015, 3:41 PM

## 2015-12-24 NOTE — OR Nursing (Signed)
MD was unable to gert peg in.

## 2015-12-24 NOTE — Progress Notes (Signed)
Nutrition Follow-up    INTERVENTION:   EN: recommend restarting TF post PEG and trach placement tomorrow; will reassess needs as needed and make further recommendations at this time pt may benefit from change in TF formula, continue to assess. Discussed sodium and potassium levels from 2/28 with MD Kasa during ICU rounds; repeat labs pending for today. If pt remains hypernatremic, recommend IV hydration as pt will remain NPO until at least after trach tomorrow. If potassium low, recommend supplementing   NUTRITION DIAGNOSIS:   Inadequate oral intake related to inability to eat as evidenced by NPO status. Being addressed  GOAL:   Patient will meet greater than or equal to 90% of their needs  MONITOR:    (Energy Intake, Electrolyte and renal Profile, Anthropometrics, Digestive System, Glucose Profile, Pulmonary Profile)  REASON FOR ASSESSMENT:   Ventilator, Consult Enteral/tube feeding initiation and management  ASSESSMENT:   Pt remains on vent, plan for PEG placement today, trach placement tomorrow  Diet Order:  Diet NPO time specified   EN: TF on hold since midnight for PEG placement  Digestive System: diarrhea via flexiseal    Recent Labs Lab 12/20/15 0542 12/21/15 0419 12/22/15 0204 12/22/15 0819 12/22/15 1036 12/23/15 0533  NA 140 145 146*  --   --  147*  K 3.8 3.9 3.2*  --  3.6 3.3*  CL 113* 116* 114*  --   --  113*  CO2 '22 24 23  '$ --   --  27  BUN 32* 36* 37*  --   --  45*  CREATININE 1.32* 1.35* 1.34*  --   --  1.19  CALCIUM 6.8* 7.2* 7.4*  --   --  7.7*  MG 1.8  --  2.2  --   --  1.9  PHOS 2.3* 2.4* 2.4* 3.5  --  2.7  GLUCOSE 186* 285* 281*  --   --  225*    Glucose Profile:  Recent Labs  12/24/15 0549 12/24/15 0751 12/24/15 0858  GLUCAP 87 120* 127*   Meds: reviewed  Height:   Ht Readings from Last 1 Encounters:  12/05/15 '6\' 4"'$  (1.93 m)    Weight:   Wt Readings from Last 1 Encounters:  12/24/15 132 lb 11.5 oz (60.2 kg)    Filed  Weights   12/22/15 0500 12/23/15 0432 12/24/15 0537  Weight: 138 lb 10.7 oz (62.9 kg) 132 lb 0.9 oz (59.9 kg) 132 lb 11.5 oz (60.2 kg)    BMI:  Body mass index is 16.16 kg/(m^2).  Estimated Nutritional Needs:   Kcal:  1711 kcals (Ve: 15.6, Tmax: 38.6) using wt of 72 kg, legnth of 44 inches  Protein:  108-144 g (1.5-2.0 g/kg)   Fluid:  1800-2160 mL (25-30 ml/kg)   EDUCATION NEEDS:   No education needs identified at this time  [place], RD, LDN [phone] Pager  [phone] Weekend/On-Call Pager

## 2015-12-24 NOTE — Progress Notes (Signed)
Paged Dr. Rayann Heman to notify him of the Abdominal xray results.  Waiting on call back.

## 2015-12-24 NOTE — Progress Notes (Signed)
Pharmacy Antibiotic Note  Stephen Camacho is a 70 y.o. male admitted on 12/05/2015 with pneumonia.  Pharmacy has been consulted for vancomycin, Zosyn, and anidulafungin dosing.  Plan: Vancomycin 1000 mg IV every 18 hours. Trough scheduled with the 4th dose 3/3. Goal trough 15-20 mcg/mL.  Zosyn 3.375 g EI q 8 hours.   Anidulafungin '100mg'$  IV Q24hr.    Pharmacy will continue to monitor and adjust per consult.   Height: '6\' 4"'$  (193 cm) Weight: 132 lb 11.5 oz (60.2 kg) IBW/kg (Calculated) : 86.8  Temp (24hrs), Avg:100.3 F (37.9 C), Min:99.9 F (37.7 C), Max:101.7 F (38.7 C)   Recent Labs Lab 12/19/15 0600 12/20/15 0542 12/21/15 0419 12/22/15 0204 12/23/15 0533 12/23/15 2137  WBC 28.9* 21.9* 20.3* 22.6* 18.5*  --   CREATININE 1.35* 1.32* 1.35* 1.34* 1.19  --   VANCOTROUGH  --   --   --   --   --  13    Estimated Creatinine Clearance: 49.2 mL/min (by C-G formula based on Cr of 1.19).    No Known Allergies  Antimicrobials this admission: vancomycin 02/22 >> 02/23 ceftazidime 02/22 >> 02/24 Metronidazole 02/23 >> 02/24 Zosyn 02/24 >> Vancomycin 02/26 >> Anidulafungin 02/27>>  Dose adjustments this admission:   Microbiology results: 02/23 BCx: negative x 2 02/23 UCx: negative  02/21 Sputum: Candida albicans  02/22 MRSA PCR: negative 02/23 TA Candida albicans 02/26 TA moderate growth Candida albicans  Thank you for allowing pharmacy to be a part of this patient's care.  [name] 12/24/2015 11:08 AM

## 2015-12-24 NOTE — Progress Notes (Signed)
Telephone call with daughter, Stephen Camacho. Long discussion with the daughter of Stephen Camacho concerning the findings of free air following an attempted EGD and PEG placement which resulted in free air. I have reviewed the chart and the CT scan personally as well as the operative report from Dr. Rayann Heman. Discussed these findings with Stephen Camacho who had given the opinion that Stephen Camacho. This is all reviewed with the daughter and Stephen Camacho had spoken to the son earlier today. I discussed CODE STATUS and I discussed general anesthesia and the fact that there is a small possibility that a hole in the stomach or colon could heal on its own especially in light of the fact that Stephen Camacho is on so many antibiotics at this point. I also reviewed his extreme risk from a general anesthetic following a myocardial infarction is generalized weakness and poor medical condition. Stephen Camacho dated that she did not want him to have a major surgery at this point but did want him to proceed with tracheostomy and feeding tube placement again so that he could get to a long-term facility. She also wants him to remain a full code. I reminded her that he may die from any of his current medical conditions and that a perforation of the colon or stomach could hasten his demise if it is untreated. She again stated that she wanted to try to have this heal on its own. I discussed hospice and palliative care with her but she is of the belief that Stephen Camacho is a candidate for rehabilitation after he receives his tracheostomy. I suggested that she have a discussion with her father's physicians in the morning to discuss some of these possibilities but I'm in agreement and a voice that opinion with her that Stephen Camacho and I believe that Stephen Camacho would not survive a major surgery and that he should not undergo a major laparotomy  at this time. Stephen Camacho was in agreement with that plan

## 2015-12-24 NOTE — Progress Notes (Signed)
GI Note:  I have reviewed KUB and CT findings.  Transillumination was transient and the trocar needle likely hit the transverse colon during a respiration.  I had a long discussion with the daughter [name].  She is upset about this latest complication.   I agree with the surgical team recommendations and appreciate their extensive care and communication with the family with this unfortunate complication.

## 2015-12-24 NOTE — Progress Notes (Signed)
Notified Dr. Mortimer Fries about patient being NPO since midnight and patient has had 6 consecutive sugars in a row and per ICU glycemic control protocol- we are able to wean patient off the insulin drip.  Per Dr. Mortimer Fries- no basal insulin should be given since patient is NPO and will continue to be NPO and just turn off drip and check blood sugars Q4 hr.

## 2015-12-24 NOTE — Care Management (Signed)
Stephen Camacho is initiating referral for ltac through patient's insurance.  Process can not be started until patient has trach and peg.  Peg has been placed and for trach 3/2. Obtaining auth for this level of care could be a tedious process with the insurance company

## 2015-12-24 NOTE — Progress Notes (Signed)
S:  Remains intubated and minimally responsive, PEG unsuccessful today  O:  AF VSS       Lungs rhonchi bilaterally oropharynx with ET tube present       Left leg incision CD&I  A:  S/P left AKA      S/P Acute MI with associated pulmonary and renal failure  P:  Continue supportive care

## 2015-12-25 ENCOUNTER — Encounter: Payer: Self-pay | Admitting: Gastroenterology

## 2015-12-25 ENCOUNTER — Encounter
Admission: EM | Disposition: A | Discharge status: Nursing Home (Includes ICF) | Payer: Self-pay | Source: Home / Self Care | Attending: Vascular Surgery

## 2015-12-25 ENCOUNTER — Other Ambulatory Visit: Payer: Self-pay | Admitting: Radiology

## 2015-12-25 LAB — GLUCOSE, CAPILLARY
GLUCOSE-CAPILLARY: 144 mg/dL — AB (ref 65–99)
GLUCOSE-CAPILLARY: 157 mg/dL — AB (ref 65–99)
GLUCOSE-CAPILLARY: 167 mg/dL — AB (ref 65–99)
GLUCOSE-CAPILLARY: 181 mg/dL — AB (ref 65–99)
Glucose-Capillary: 159 mg/dL — ABNORMAL HIGH (ref 65–99)
Glucose-Capillary: 163 mg/dL — ABNORMAL HIGH (ref 65–99)
Glucose-Capillary: 185 mg/dL — ABNORMAL HIGH (ref 65–99)

## 2015-12-25 LAB — BLOOD GAS, ARTERIAL
Acid-Base Excess: 3.3 mmol/L — ABNORMAL HIGH (ref 0.0–3.0)
Allens test (pass/fail): POSITIVE — AB
BICARBONATE: 26.1 meq/L (ref 21.0–28.0)
FIO2: 0.3
LHR: 15 {breaths}/min
O2 SAT: 99.1 %
PATIENT TEMPERATURE: 37
PEEP/CPAP: 5 cmH2O
PO2 ART: 124 mmHg — AB (ref 83.0–108.0)
VT: 500 mL
pCO2 arterial: 32 mmHg (ref 32.0–48.0)
pH, Arterial: 7.52 — ABNORMAL HIGH (ref 7.350–7.450)

## 2015-12-25 SURGERY — CREATION, TRACHEOSTOMY
Anesthesia: Choice

## 2015-12-25 MED ORDER — SODIUM CHLORIDE 0.9 % IV SOLN
25.0000 ug/h | INTRAVENOUS | Status: DC
  Administered 2015-12-25 – 2015-12-26 (×2): 250 ug/h via INTRAVENOUS
  Administered 2015-12-27: 175 ug/h via INTRAVENOUS
  Administered 2015-12-28: 200 ug/h via INTRAVENOUS
  Administered 2015-12-28 – 2015-12-31 (×6): 250 ug/h via INTRAVENOUS
  Administered 2015-12-31: 200 ug/h via INTRAVENOUS
  Administered 2016-01-01: 250 ug/h via INTRAVENOUS
  Administered 2016-01-01: 300 ug/h via INTRAVENOUS
  Administered 2016-01-03: 250 ug/h via INTRAVENOUS
  Filled 2015-12-25 (×18): qty 50

## 2015-12-25 MED ORDER — ENOXAPARIN SODIUM 40 MG/0.4ML ~~LOC~~ SOLN
40.0000 mg | SUBCUTANEOUS | Status: DC
  Administered 2015-12-25 – 2015-12-28 (×4): 40 mg via SUBCUTANEOUS
  Filled 2015-12-25 (×4): qty 0.4

## 2015-12-25 SURGICAL SUPPLY — 29 items
BLADE SURG 15 STRL LF DISP TIS (BLADE) IMPLANT
BLADE SURG 15 STRL SS (BLADE)
BLADE SURG SZ11 CARB STEEL (BLADE) IMPLANT
CANISTER SUCT 1200ML W/VALVE (MISCELLANEOUS) IMPLANT
CORD BIP STRL DISP 12FT (MISCELLANEOUS) IMPLANT
ELECT REM PT RETURN 9FT ADLT (ELECTROSURGICAL)
ELECTRODE REM PT RTRN 9FT ADLT (ELECTROSURGICAL) IMPLANT
FORCEPS JEWEL BIP 4-3/4 STR (INSTRUMENTS) IMPLANT
GAUZE IODOFORM PACK 1/2 7832 (GAUZE/BANDAGES/DRESSINGS) IMPLANT
GLOVE BIO SURGEON STRL SZ7.5 (GLOVE) IMPLANT
GOWN STRL REUS W/ TWL LRG LVL3 (GOWN DISPOSABLE) IMPLANT
GOWN STRL REUS W/TWL LRG LVL3 (GOWN DISPOSABLE)
HARMONIC SCALPEL FOCUS (MISCELLANEOUS) IMPLANT
HEMOSTAT SURGICEL 2X3 (HEMOSTASIS) IMPLANT
KIT RM TURNOVER STRD PROC AR (KITS) IMPLANT
LABEL OR SOLS (LABEL) IMPLANT
NS IRRIG 500ML POUR BTL (IV SOLUTION) IMPLANT
PACK HEAD/NECK (MISCELLANEOUS) IMPLANT
SPONGE DRAIN TRACH 4X4 STRL 2S (GAUZE/BANDAGES/DRESSINGS) IMPLANT
SPONGE KITTNER 5P (MISCELLANEOUS) IMPLANT
SUCTION FRAZIER HANDLE 10FR (MISCELLANEOUS)
SUCTION TUBE FRAZIER 10FR DISP (MISCELLANEOUS) IMPLANT
SUT SILK 2 0 (SUTURE)
SUT SILK 2 0 SH (SUTURE) IMPLANT
SUT SILK 2-0 18XBRD TIE 12 (SUTURE) IMPLANT
SUT VIC AB 3-0 PS2 18 (SUTURE) IMPLANT
SYR 3ML LL SCALE MARK (SYRINGE) IMPLANT
TUBE TRACH SHILEY  6 DIST  CUF (TUBING) IMPLANT
TUBE TRACH SHILEY 8 DIST CUF (TUBING) IMPLANT

## 2015-12-25 NOTE — Care Management (Signed)
Patient has experienced complication of peg tube insertion - there was a perforation of the colon.  Lurline Idol is on hold.  There is to be a family meeting this afternoon to discuss goals of care.

## 2015-12-25 NOTE — Progress Notes (Signed)
Pharmacy Antibiotic Note  Stephen Camacho is a 70 y.o. male admitted on 12/05/2015 with pneumonia.  Pharmacy has been consulted for vancomycin, Zosyn, and anidulafungin dosing.  Plan: Vancomycin 1000 mg IV every 18 hours. Trough scheduled with the 4th dose 3/3. Goal trough 15-20 mcg/mL.  Zosyn 3.375 g EI q 8 hours.   Anidulafungin '100mg'$  IV Q24hr.    Pharmacy will continue to monitor and adjust per consult.   Height: '6\' 4"'$  (193 cm) Weight: 134 lb 7.7 oz (61 kg) IBW/kg (Calculated) : 86.8  Temp (24hrs), Avg:100.2 F (37.9 C), Min:99.3 F (37.4 C), Max:101.2 F (38.4 C)   Recent Labs Lab 12/20/15 0542 12/21/15 0419 12/22/15 0204 12/23/15 0533 12/23/15 2137 12/24/15 1032  WBC 21.9* 20.3* 22.6* 18.5*  --  18.5*  CREATININE 1.32* 1.35* 1.34* 1.19  --  1.23  VANCOTROUGH  --   --   --   --  13  --     Estimated Creatinine Clearance: 48.2 mL/min (by C-G formula based on Cr of 1.23).    No Known Allergies  Antimicrobials this admission: vancomycin 02/22 >> 02/23 ceftazidime 02/22 >> 02/24 Metronidazole 02/23 >> 02/24 Zosyn 02/24 >> Vancomycin 02/26 >> Anidulafungin 02/27>>  Dose adjustments this admission:   Microbiology results: 02/23 BCx: negative x 2 02/23 UCx: negative  02/21 Sputum: Candida albicans  02/22 MRSA PCR: negative 02/23 TA Candida albicans 02/26 TA moderate growth Candida albicans  Thank you for allowing pharmacy to be a part of this patient's care.  [name] 12/25/2015 11:06 AM

## 2015-12-25 NOTE — Progress Notes (Signed)
Nutrition Follow-up    INTERVENTION:   Coordination of Care: await further determination of poc, family meeting pending; if aggressive care is wanted, pt will likely require TPN for nutrition support. Will continue to assess  NUTRITION DIAGNOSIS:   Inadequate oral intake related to inability to eat as evidenced by NPO status.  GOAL:   Patient will meet greater than or equal to 90% of their needs  MONITOR:    (Energy Intake, Electrolyte and renal Profile, Anthropometrics, Digestive System, Glucose Profile, Pulmonary Profile)  REASON FOR ASSESSMENT:   Ventilator, Consult Enteral/tube feeding initiation and management  ASSESSMENT:   PEG tube placement was attempted yesterday but unsuccessful, pt now with likely bowel perforation (needle likely hit transverse colon during procedure) with no surgical intervention planned at this time, pt remains full code, remains sedated on vent, family meeting planned for this afternoon to discuss goals of care  Diet Order:  Diet NPO time specified   EN: TF discontinued   Recent Labs Lab 12/20/15 0542  12/22/15 0204 12/22/15 0819 12/22/15 1036 12/23/15 0533 12/24/15 1032  NA 140  < > 146*  --   --  147* 144  K 3.8  < > 3.2*  --  3.6 3.3* 4.3  CL 113*  < > 114*  --   --  113* 112*  CO2 22  < > 23  --   --  27 24  BUN 32*  < > 37*  --   --  45* 38*  CREATININE 1.32*  < > 1.34*  --   --  1.19 1.23  CALCIUM 6.8*  < > 7.4*  --   --  7.7* 7.8*  MG 1.8  --  2.2  --   --  1.9  --   PHOS 2.3*  < > 2.4* 3.5  --  2.7  --   GLUCOSE 186*  < > 281*  --   --  225* 210*  < > = values in this interval not displayed.  Glucose Profile:  Recent Labs  12/25/15 0753 12/25/15 1150 12/25/15 1618  GLUCAP 144* 167* 185*   Meds: ss novolog, levophed  Height:   Ht Readings from Last 1 Encounters:  12/05/15 '6\' 4"'$  (1.93 m)    Weight:   Wt Readings from Last 1 Encounters:  12/25/15 134 lb 7.7 oz (61 kg)    Filed Weights   12/23/15 0432  12/24/15 0537 12/25/15 0500  Weight: 132 lb 0.9 oz (59.9 kg) 132 lb 11.5 oz (60.2 kg) 134 lb 7.7 oz (61 kg)    BMI:  Body mass index is 16.38 kg/(m^2).  Estimated Nutritional Needs:   Kcal:  1711 kcals (Ve: 15.6, Tmax: 38.6) using wt of 72 kg, legnth of 44 inches  Protein:  108-144 g (1.5-2.0 g/kg)   Fluid:  1800-2160 mL (25-30 ml/kg)   EDUCATION NEEDS:   No education needs identified at this time  [place], RD, LDN [phone] Pager  [phone] Weekend/On-Call Pager

## 2015-12-25 NOTE — Progress Notes (Signed)
S:  Remains intubated and minimally responsive, PEG unsuccessful today  O:  Tm 100.4  HR 111 BP 127/81       Lungs rhonchi bilaterally oropharynx with ET tube present       Left leg incision CD&I  A:  S/P left AKA      S/P Acute MI with associated pulmonary and renal failure       Complication of attempted PEG placement with free air  P:  Continue supportive care       I have had a long discussion of over an hour with the patient's son and daughter. I have reviewed the findings after the attempted PEG placement and the implications of worsening peritonitis. Both the patient's son and daughter feel strongly that he will recover and wish that everything be done. I have discussed with them that in all likelihood he would not survive an exploratory laparotomy and therefore our only option given the finding of free air following attempted PEG placement is to continue broad-spectrum antibiotics. Having established this I have also informed them that if indeed there is a colon or small bowel perforation then antibiotics will not likely be adequate therapy and that he would succumb to this problem. They seem to acknowledge this and understand that his condition does not allow for a major intra-abdominal operation. I have also discussed with him that over the past 24 hours although he has not been improving he does not seem to be deteriorating as I would expect given an injury to a viscus. Along this line I have discussed with them that he does not appear to be improving from a pulmonary point of view and that exclusive of this complication his chance of survival is quite minimal. This is a issue that the stool seem to grasp with regarding the gravity of his situation. At this point we will continue antibiotics and supportive care. I have discussed with the family should he remain relatively stable that next week we will once again need to address a G-tube for nutrition and then a tracheostomy. Both the son and  the daughter are very emphatic that we continue to press on with maximal medical care.

## 2015-12-25 NOTE — Progress Notes (Addendum)
Barium contrast given per MD order. Verified/received clarification on order from Dr. Annamaria Boots, radiologist on call, who said go ahead with the order. Daughter and Son both called and requested to be updated on Stephen Camacho's condition and any changes throughout the night. Son would like to speak with MD in the AM before procedure. Stephen Camacho remains stable at this time. Will continue to monitor.

## 2015-12-25 NOTE — Progress Notes (Signed)
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 Now with acute respiratory failure, intubated due to pneumonia. Underwent a left AKA   SUBJECTIVE:  Sedated on the vent. Increased WOB on vent, on Fentanyl gtt and versed started;on zosyn/Vanc/antfungal therapy. on antifungal therapy Remains sedated and critically ill, awaiting Trach and PEG tube placement Complication from PEG tube placement s/p bowel/stomach perforation-gen surgery notes reviewed  EVENTS/DATA: 02/10 >> LLE angiogram, thrombolysis, thrombectomy, angioplasty 02/11 Intubated for suspected PNA 02/11 elevated trop I. Pk trop I 62.6 on 02/20 02/12 CT head: New nonhemorrhagic infarcts involving the right PCA territory and bilateral cerebellum, left greater than right 02/17 L AKA 02/18>extubated 02/21 TTE: LVEF 55-60% 02/21 RUE [place]: no DVT 02/21>Re-intubated 02/22 >CT head: Interval evolution of bilateral cerebellar and right PCA territory infarcts 02/27-remains intubated,discussion with family-proceed with trach and PEG  INDWELLING DEVICES:: ETT 02/11 >> 02/18, 02/21 >>  R IJ CVL 02/10 >> removed PICC line RT  MICRO DATA: MRSA PCR 02/13 >> NEG Urine 02/12 >> NEG Resp 02/11 >> MSSA C diff 02/16 >> NEG Blood 02/12 >> NEG Urine 02/19 >> NEG C diff 02/21 >> NEG Cath tip 02/19 >> NEG Resp 02/18 >> light growth candida Urine 02/21 >> NEG MRSA PCR 02/22 >> NEG Respiratory 02/26>>  ANTIMICROBIALS:  Vanc 02/11 >> 02/23 Pip-tazo 02/11 >> 02/20 Ceftaz 02/21 >> 02/24 Metronidazole 02/21 >> 02/24 Pip-tazo 02/24 >>  Vancomycin  02/25>>   VITAL SIGNS: BP 115/67 mmHg  Pulse 98  Temp(Src) 99.3 F (37.4 C) (Rectal)  Resp 15  Ht '6\' 4"'$  (1.93 m)  Wt 134 lb 7.7 oz (61 kg)  BMI 16.38 kg/m2  SpO2 100%  HEMODYNAMICS:    VENTILATOR SETTINGS: Vent Mode:  [-] PRVC FiO2 (%):  [30 %] 30 % Set Rate:  [15 bmp] 15 bmp Vt Set:  [500 mL] 500 mL PEEP:  [5 cmH20] 5 cmH20 Plateau Pressure:  [15 cmH20-18 cmH20] 18 cmH20  INTAKE / OUTPUT: I/O last 3 completed shifts: In: 1883.6 [I.V.:783.6; NG/GT:350; IV Piggyback:750] Out: 4098 [Urine:3125; Stool:100]  PHYSICAL EXAMINATION: General: Chronically ill appearing;+resp distress, on vent Neuro: CNs intact, MAEs, agitated on WUA HEENT: NCAT, ETT in place Cardiovascular: RRR, S1/S2, no MRG Lungs: Bilateral airflow, no wheezes Abdomen: Soft, NT, +BS Ext: B AKA, L surgical wound clean and dry  LABS:  BMET  Recent Labs Lab 12/22/15 0204 12/22/15 1036 12/23/15 0533 12/24/15 1032  NA 146*  --  147* 144  K 3.2* 3.6 3.3* 4.3  CL 114*  --  113* 112*  CO2 23  --  27 24  BUN 37*  --  45* 38*  CREATININE 1.34*  --  1.19 1.23  GLUCOSE 281*  --  225* 210*    Electrolytes  Recent Labs Lab 12/20/15 0542  12/22/15 0204 12/22/15 0819 12/23/15 0533 12/24/15 1032  CALCIUM 6.8*  < > 7.4*  --  7.7* 7.8*  MG 1.8  --  2.2  --  1.9  --   PHOS 2.3*  < > 2.4* 3.5 2.7  --   < > = values in this interval not displayed.  CBC  Recent Labs Lab 12/22/15  0174 12/23/15 0533 12/24/15 1032  WBC 22.6* 18.5* 18.5*  HGB 8.8* 7.6* 7.6*  HCT 27.8* 24.3* 25.0*  PLT 348 396 474*    Coag's No results for input(s): APTT, INR in the last 168 hours.  Sepsis Markers No results for input(s): LATICACIDVEN, PROCALCITON, O2SATVEN in the last 168 hours.  ABG  Recent Labs Lab 12/22/15 0850  PHART 7.40  PCO2ART 40  PO2ART 67*    Liver Enzymes No results for input(s): AST, ALT, ALKPHOS, BILITOT, ALBUMIN in the last 168 hours.  Cardiac Enzymes  Recent Labs Lab  12/21/15 0419  TROPONINI 0.28*    Glucose  Recent Labs Lab 12/24/15 1337 12/24/15 1628 12/24/15 1941 12/24/15 2349 12/25/15 0452 12/25/15 0753  GLUCAP 167* 212* 173* 157* 159* 144*   Dg Abd 1 View  12/24/2015  CLINICAL DATA:  Abdominal distention patient with history of prostate and lung carcinoma. Initial encounter. EXAM: ABDOMEN - 1 VIEW COMPARISON:  Single view of the abdomen 02/29/2017 and 12/14/2015. FINDINGS: There is visualization of both the inner and outer walls of multiple small bowel loops in the right upper quadrant of the abdomen compatible with a double wall sign as seen in free intraperitoneal air. Lucency in the right upper quadrant of the abdomen appears to be extraluminal air. Gas-filled but non dilated loops of bowel seen throughout the abdomen. IMPRESSION: Findings consistent with free intraperitoneal air. CT abdomen and pelvis with oral and IV contrast recommended for further evaluation. Critical Value/emergent results were called by telephone at the time of interpretation on 12/24/2015 at 1:30 pm to the patient's nurse, Eduard Clos, who verbally acknowledged these results. Electronically Signed   By: Inge Rise M.D.   On: 12/24/2015 13:34   Ct Abdomen Pelvis W Contrast  12/24/2015  ADDENDUM REPORT: 12/24/2015 19:49 ADDENDUM: These results were called by telephone at the time of interpretation on 12/24/2015 at 7:48 pm to Dr. Arther Dames , who verbally acknowledged these results. Electronically Signed   By: Anner Crete M.D.   On: 12/24/2015 19:49  12/24/2015  CLINICAL DATA:  70 year old male with attempted PEG placement is and free air was seen on the follow-up radiograph. EXAM: CT ABDOMEN AND PELVIS WITH CONTRAST TECHNIQUE: Multidetector CT imaging of the abdomen and pelvis was performed using the standard protocol following bolus administration of intravenous contrast. CONTRAST:  155m OMNIPAQUE IOHEXOL 300 MG/ML  SOLN COMPARISON:  Radiograph dated 12/24/2015 FINDINGS:  There small bilateral pleural effusions. Patchy areas of consolidation at the lung bases bilaterally most concerning for pneumonia. Clinical correlation is recommended. There is mild cardiomegaly. Advanced coronary vascular calcification and CABG surgery. A central venous catheter is partially visualized with tip in the right atrium. There is pneumoperitoneum. No significant free fluid identified within the abdomen and pelvis. The liver appears unremarkable with the gallbladder is distended. No calcified gallstones or pericholecystic fluid identified. Ultrasound may provide better evaluation of the gallbladder if clinically indicated. The pancreas, spleen, and adrenal glands appear unremarkable. Multiple bilateral renal hypodensities measuring up to 2.3 cm in the interpolar aspect of the left kidney, likely cysts. Ultrasound may provide better characterization. There is no hydronephrosis on either side. Check the urinary bladder is decompressed around a Foley catheter. Prostatectomy. A rectal catheter is seen. An enteric tube is noted with tip in the body of the stomach. There is no evidence of bowel obstruction. No extravasation of oral contrast identified. Normal appendix. 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 There is  ectasia of the abdominal aorta. Bilateral superficial femoral artery vascular bypass grafts are partially visualized and appear occluded. Further evaluation with ultrasound or angiography recommended. There is a 1.4 x 2.4 cm fluid collection surrounding the left femoral stent graft in the anterior thigh which may represent a seroma. New of no adenopathy identified. There is degenerative changes of the spine.  No acute fracture. IMPRESSION: Pneumoperitoneum concerning for bowel perforation. Clinical correlation is recommended. No extravasation of oral contrast identified. No evidence of bowel obstruction or inflammation.  Normal appendix.  Distended gallbladder. No calcified stone. Ultrasound may provide better evaluation of the gallbladder. 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 Partially visualized bilateral femoral artery bypass grafts appear occluded. Further evaluation with ultrasound or CT angiography recommended. Small bilateral pleural effusions and bilateral lower lobe consolidative changes concerning for pneumonia. Clinical correlation and follow-up resolution recommended. Electronically Signed: By: Anner Crete M.D. 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 Now with acute respiratory failure, intubated due to pneumonia. Underwent a left AKA    PULMONARY A: Prolonged VDRF Failed extubation attempt Bilateral pulmonary infiltrates and fever- c/w  PNA P:   Cont vent support -  Cont vent bundle Daily SBT if/when meets criteria Antibiotics as above  CXR in am Repeat respiratory cultures  Will need trach for survival-ENT consulted  CARDIOVASCULAR A:  NSTEMI PAF, NSR presently NSVT P:  MAP goal > 65 mmHg Cont ASA Continue amiodarone  Hemodynamics per ICU protocol  RENAL A:   AKI, nonoliguric, creatinine stable at 1.35 Very poor candidate for HD P:   Monitor BMET intermittently Monitor I/Os Correct electrolytes as indicated Increase free water flushes  GASTROINTESTINAL A:   No issues P:   SUP: enteral PPI -now with bowel/stomach perforation-follow up gen surgery recs   HEMATOLOGIC A:   ICU acquired anemia without acute blood loss P:  DVT px: SQ heparin Monitor CBC intermittently Transfuse per usual  guidelines   INFECTIOUS A:   Severe sepsis-persistent leukocytosis and fever LLE gangrene Aspiration PNA P:   -Vanco and zosyn, antifungal therapy -Repeat respirtory cultures and f/u pending culture - tylenol and motrin prn for fever >103 -follow up ID recs  ENDOCRINE A:   Type 2 DM-Persistent hyperglycemia P:   -started insulin infusion Blood gluocose monitoring per ICU protocol  NEUROLOGIC A:   Multifocal CVAs, acute Acute encewphalopathy H/O seizures - not on chronic AEDs PTA P:   RASS goal: -1, -2 Fentanyl gtt and prn versed for vent sedation       The Patient requires high complexity decision making for assessment and support, frequent evaluation and titration of therapies, application of advanced monitoring technologies and extensive interpretation of multiple databases. Critical Care Time devoted to patient care services described in this note is 40 minutes.   Overall, patient is critically ill, prognosis is guarded.  Patient with Multiorgan failure and at high risk for cardiac arrest and death.  Will proceed with [facility] and PEG tube as per surgery/GI  Corrin Parker, M.D.  [facility]  Medical Director [place] Director [facility] Cardio-Pulmonary Department

## 2015-12-25 NOTE — Progress Notes (Signed)
Notified Dr. Mortimer Fries that patient is off sedation and still has not followed commands- he ordered to place patient back on sedation and keep comfortable and also a ABG.

## 2015-12-26 ENCOUNTER — Other Ambulatory Visit: Payer: PPO

## 2015-12-26 DIAGNOSIS — I638 Other cerebral infarction: Secondary | ICD-10-CM

## 2015-12-26 DIAGNOSIS — K567 Ileus, unspecified: Secondary | ICD-10-CM

## 2015-12-26 DIAGNOSIS — I639 Cerebral infarction, unspecified: Secondary | ICD-10-CM | POA: Insufficient documentation

## 2015-12-26 DIAGNOSIS — R140 Abdominal distension (gaseous): Secondary | ICD-10-CM

## 2015-12-26 LAB — COMPREHENSIVE METABOLIC PANEL
ALBUMIN: 1.8 g/dL — AB (ref 3.5–5.0)
ALK PHOS: 101 U/L (ref 38–126)
ALT: 31 U/L (ref 17–63)
ANION GAP: 10 (ref 5–15)
AST: 20 U/L (ref 15–41)
BUN: 33 mg/dL — ABNORMAL HIGH (ref 6–20)
CALCIUM: 8.1 mg/dL — AB (ref 8.9–10.3)
CO2: 25 mmol/L (ref 22–32)
CREATININE: 1.34 mg/dL — AB (ref 0.61–1.24)
Chloride: 112 mmol/L — ABNORMAL HIGH (ref 101–111)
GFR calc Af Amer: >60 mL/min (ref >60)
GFR calc non Af Amer: 52 mL/min — ABNORMAL LOW (ref >60)
GLUCOSE: 229 mg/dL — AB (ref 65–99)
Potassium: 3.8 mmol/L (ref 3.5–5.1)
SODIUM: 147 mmol/L — AB (ref 135–145)
Total Bilirubin: 1.1 mg/dL (ref 0.3–1.2)
Total Protein: 6.5 g/dL (ref 6.5–8.1)

## 2015-12-26 LAB — GLUCOSE, CAPILLARY
GLUCOSE-CAPILLARY: 197 mg/dL — AB (ref 65–99)
GLUCOSE-CAPILLARY: 172 mg/dL — AB (ref 65–99)
GLUCOSE-CAPILLARY: 210 mg/dL — AB (ref 65–99)
Glucose-Capillary: 168 mg/dL — ABNORMAL HIGH (ref 65–99)
Glucose-Capillary: 186 mg/dL — ABNORMAL HIGH (ref 65–99)
Glucose-Capillary: 255 mg/dL — ABNORMAL HIGH (ref 65–99)

## 2015-12-26 LAB — PHOSPHORUS: Phosphorus: 3.5 mg/dL (ref 2.5–4.6)

## 2015-12-26 LAB — MAGNESIUM: Magnesium: 1.9 mg/dL (ref 1.7–2.4)

## 2015-12-26 LAB — VANCOMYCIN, TROUGH: Vancomycin Tr: 22 ug/mL (ref 10–20)

## 2015-12-26 MED ORDER — ACETAMINOPHEN 650 MG RE SUPP
650.0000 mg | Freq: Four times a day (QID) | RECTAL | Status: DC | PRN
  Administered 2015-12-26: 650 mg via RECTAL
  Filled 2015-12-26 (×4): qty 1

## 2015-12-26 MED ORDER — VANCOMYCIN HCL IN DEXTROSE 750-5 MG/150ML-% IV SOLN
750.0000 mg | INTRAVENOUS | Status: DC
  Administered 2015-12-26 – 2015-12-28 (×4): 750 mg via INTRAVENOUS
  Filled 2015-12-26 (×4): qty 150

## 2015-12-26 MED ORDER — ACETAMINOPHEN 325 MG PO TABS: 650.0000 mg | ORAL_TABLET | Freq: Four times a day (QID) | ORAL | Status: DC | PRN

## 2015-12-26 MED ORDER — TRACE MINERALS CR-CU-MN-SE-ZN 10-1000-500-60 MCG/ML IV SOLN: INTRAVENOUS | Status: DC

## 2015-12-26 MED ORDER — TRACE MINERALS CR-CU-MN-SE-ZN 10-1000-500-60 MCG/ML IV SOLN
INTRAVENOUS | Status: AC
  Administered 2015-12-26: 18:00:00 via INTRAVENOUS
  Filled 2015-12-26: qty 984

## 2015-12-26 MED ORDER — PANTOPRAZOLE SODIUM 40 MG IV SOLR
40.0000 mg | INTRAVENOUS | Status: DC
  Administered 2015-12-26 – 2015-12-30 (×5): 40 mg via INTRAVENOUS
  Filled 2015-12-26 (×5): qty 40

## 2015-12-26 MED ORDER — INSULIN ASPART 100 UNIT/ML ~~LOC~~ SOLN
0.0000 [IU] | SUBCUTANEOUS | Status: DC
  Administered 2015-12-26: 5 [IU] via SUBCUTANEOUS
  Administered 2015-12-26: 4 [IU] via SUBCUTANEOUS
  Administered 2015-12-26: 3 [IU] via SUBCUTANEOUS
  Filled 2015-12-26: qty 5
  Filled 2015-12-26: qty 4
  Filled 2015-12-26: qty 3

## 2015-12-26 NOTE — Plan of Care (Signed)
Problem: Phase I Progression Outcomes Goal: Patient tolerating nututrition at goal Outcome: Progressing Unable to start tube feeding due to gastroinstestional complications. Patient started TPN this shift. Goal: Tracheostomy by Vent Day 14 Outcome: Progressing Dr. Mortimer Fries spoke with ENT MD, plan for tracheostomy on upcoming Tuesday 12/30/15.

## 2015-12-26 NOTE — Progress Notes (Signed)
Pharmacy Antibiotic Note  Stephen Camacho is a 70 y.o. male admitted on 12/05/2015 with pneumonia.  Pharmacy has been consulted for vancomycin, Zosyn, and anidulafungin dosing.  Plan: Vancomycin dose decreased to 750 mg iv q 18 hours with slightly elevated trough. Next trough scheduled with the 4th dose of the new regimen on 3/5 at 1130.s Goal trough 15-20 mcg/mL.  Zosyn 3.375 g EI q 8 hours.   Anidulafungin '100mg'$  IV Q24hr.    Pharmacy will continue to monitor and adjust per consult.   Height: '6\' 4"'$  (193 cm) Weight: 130 lb 1.1 oz (59 kg) IBW/kg (Calculated) : 86.8  Temp (24hrs), Avg:99.8 F (37.7 C), Min:99.1 F (37.3 C), Max:101.1 F (38.4 C)   Recent Labs Lab 12/20/15 0542 12/21/15 0419 12/22/15 0204 12/23/15 0533 12/23/15 2137 12/24/15 1032 12/26/15 0446 12/26/15 1109  WBC 21.9* 20.3* 22.6* 18.5*  --  18.5*  --   --   CREATININE 1.32* 1.35* 1.34* 1.19  --  1.23  --  1.34*  VANCOTROUGH  --   --   --   --  13  --  22*  --     Estimated Creatinine Clearance: 42.8 mL/min (by C-G formula based on Cr of 1.34).    No Known Allergies  Antimicrobials this admission: vancomycin 02/22 >> 02/23 ceftazidime 02/22 >> 02/24 Metronidazole 02/23 >> 02/24 Zosyn 02/24 >> Vancomycin 02/26 >> Anidulafungin 02/27>>  Dose adjustments this admission:   Microbiology results: 02/23 BCx: negative x 2 02/23 UCx: negative  02/21 Sputum: Candida albicans  02/22 MRSA PCR: negative 02/23 TA Candida albicans 02/26 TA moderate growth Candida albicans  Thank you for allowing pharmacy to be a part of this patient's care.  [name] 12/26/2015 12:11 PM

## 2015-12-26 NOTE — Progress Notes (Signed)
Pharmacy Antibiotic Note  Stephen Camacho is a 70 y.o. male admitted on 12/05/2015 with pneumonia.  Pharmacy has been consulted for vancomycin, Zosyn, and anidulafungin dosing.  Plan: Vancomycin trough 22 mcg/mL. Recalculated Ke 0.04 hr-1. Decrease to 750 mg IV Q18H, predicted trough 17 mcg/mL.     Pharmacy will continue to monitor and adjust per consult.   Height: '6\' 4"'$  (193 cm) Weight: 134 lb 7.7 oz (61 kg) IBW/kg (Calculated) : 86.8  Temp (24hrs), Avg:99.6 F (37.6 C), Min:99.1 F (37.3 C), Max:100.4 F (38 C)   Recent Labs Lab 12/20/15 0542 12/21/15 0419 12/22/15 0204 12/23/15 0533 12/23/15 2137 12/24/15 1032 12/26/15 0446  WBC 21.9* 20.3* 22.6* 18.5*  --  18.5*  --   CREATININE 1.32* 1.35* 1.34* 1.19  --  1.23  --   VANCOTROUGH  --   --   --   --  13  --  22*    Estimated Creatinine Clearance: 48.2 mL/min (by C-G formula based on Cr of 1.23).    No Known Allergies  Antimicrobials this admission: vancomycin 02/22 >> 02/23 ceftazidime 02/22 >> 02/24 Metronidazole 02/23 >> 02/24 Zosyn 02/24 >> Vancomycin 02/26 >> Anidulafungin 02/27>>  Dose adjustments this admission:   Microbiology results: 02/23 BCx: negative x 2 02/23 UCx: negative  02/21 Sputum: Candida albicans  02/22 MRSA PCR: negative 02/23 TA Candida albicans 02/26 TA moderate growth Candida albicans  Thank you for allowing pharmacy to be a part of this patient's care.  Laural Benes, Pharm.D., BCPS Clinical Pharmacist 12/26/2015 5:27 AM

## 2015-12-26 NOTE — Care Management (Signed)
Attending met with son and daughter last pm and both are adamant to move forward with aggressive treatment.  Anticipate trach early next week.  If patient remains stable- may readdress the PEG.  Will continue to pursue ltac referral/auth

## 2015-12-26 NOTE — Progress Notes (Signed)
Received report from Judson Roch, Therapist, sports.  Pt. Restless: HR >120, temp elevated >101, expiratory pressure alarming on vent. Increased fent gtt, gave versed PRN and tylenol. Rolled pt. Check on multiple skin tears, changed dsg.  Verified ETT/OGT placement, gtt information and emergency equipment bedside. Will continue to monitor pt. Closely.

## 2015-12-26 NOTE — Progress Notes (Signed)
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 Now with acute respiratory failure, intubated due to pneumonia. Underwent a left AKA   SUBJECTIVE:  Sedated on the vent. Increased WOB on vent, on Fentanyl gtt and versed started;on zosyn/Vanc/antfungal therapy. on antifungal therapy Remains sedated and critically ill, family discussion held by surgeons-family requests pushing forward Complication from PEG tube placement s/p bowel/stomach perforation-gen surgery notes reviewed -will proceed with Trach  EVENTS/DATA: 02/10 >> LLE angiogram, thrombolysis, thrombectomy, angioplasty 02/11 Intubated for suspected PNA 02/11 elevated trop I. Pk trop I 62.6 on 02/20 02/12 CT head: New nonhemorrhagic infarcts involving the right PCA territory and bilateral cerebellum, left greater than right 02/17 L AKA 02/18>extubated 02/21 TTE: LVEF 55-60% 02/21 RUE [place]: no DVT 02/21>Re-intubated 02/22 >CT head: Interval evolution of bilateral cerebellar and right PCA territory infarcts 02/27-remains intubated,discussion with family-proceed with trach and PEG  INDWELLING DEVICES:: ETT 02/11 >> 02/18, 02/21 >>  R IJ CVL 02/10 >> removed PICC line RT  MICRO DATA: MRSA PCR 02/13 >> NEG Urine 02/12 >> NEG Resp 02/11 >> MSSA C diff 02/16 >> NEG Blood 02/12 >> NEG Urine 02/19 >> NEG C diff 02/21 >> NEG Cath tip 02/19 >> NEG Resp 02/18 >> light growth candida Urine 02/21 >> NEG MRSA PCR 02/22 >> NEG Respiratory 02/26>>  ANTIMICROBIALS:  Vanc 02/11 >> 02/23 Pip-tazo 02/11 >> 02/20 Ceftaz 02/21 >>  02/24 Metronidazole 02/21 >> 02/24 Pip-tazo 02/24 >>  Vancomycin 02/25>>   VITAL SIGNS: BP 110/66 mmHg  Pulse 98  Temp(Src) 99.7 F (37.6 C) (Rectal)  Resp 17  Ht '6\' 4"'$  (1.93 m)  Wt 130 lb 1.1 oz (59 kg)  BMI 15.84 kg/m2  SpO2 100%  HEMODYNAMICS:    VENTILATOR SETTINGS: Vent Mode:  [-] PRVC FiO2 (%):  [30 %] 30 % Set Rate:  [15 bmp] 15 bmp Vt Set:  [500 mL] 500 mL PEEP:  [5 cmH20] 5 cmH20 Plateau Pressure:  [14 cmH20-19 cmH20] 14 cmH20  INTAKE / OUTPUT: I/O last 3 completed shifts: In: 1569.7 [I.V.:769.7; IV Piggyback:800] Out: 2500 [Urine:2500]  PHYSICAL EXAMINATION: General: Chronically ill appearing;+resp distress, on vent Neuro: CNs intact, MAEs, agitated on WUA HEENT: NCAT, ETT in place Cardiovascular: RRR, S1/S2, no MRG Lungs: Bilateral airflow, no wheezes Abdomen: Soft, NT, +BS Ext: B AKA, L surgical wound clean and dry  LABS:  BMET  Recent Labs Lab 12/22/15 0204 12/22/15 1036 12/23/15 0533 12/24/15 1032  NA 146*  --  147* 144  K 3.2* 3.6 3.3* 4.3  CL 114*  --  113* 112*  CO2 23  --  27 24  BUN 37*  --  45* 38*  CREATININE 1.34*  --  1.19 1.23  GLUCOSE 281*  --  225* 210*    Electrolytes  Recent Labs Lab 12/20/15 0542  12/22/15 0204 12/22/15 0819 12/23/15 0533 12/24/15 1032  CALCIUM 6.8*  < > 7.4*  --  7.7* 7.8*  MG 1.8  --  2.2  --  1.9  --   PHOS 2.3*  < > 2.4* 3.5 2.7  --   < > = values in this interval not displayed.  CBC  Recent Labs Lab 12/22/15 0204 12/23/15 0533 12/24/15 1032  WBC 22.6* 18.5* 18.5*  HGB 8.8* 7.6* 7.6*  HCT 27.8* 24.3* 25.0*  PLT 348 396 474*    Coag's No results for input(s): APTT, INR in the last 168 hours.  Sepsis Markers No results for input(s): LATICACIDVEN, PROCALCITON, O2SATVEN in the last 168 hours.  ABG  Recent Labs Lab 12/22/15 0850 12/25/15 1000  PHART 7.40 7.52*  PCO2ART 40 32  PO2ART 67* 124*    Liver Enzymes No results for input(s): AST, ALT, ALKPHOS, BILITOT, ALBUMIN  in the last 168 hours.  Cardiac Enzymes  Recent Labs Lab 12/21/15 0419  TROPONINI 0.28*    Glucose  Recent Labs Lab 12/25/15 1150 12/25/15 1618 12/25/15 2019 12/25/15 2347 12/26/15 0420 12/26/15 0733  GLUCAP 167* 185* 181* 163* 168* 186*   No results found. 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 Now with acute respiratory failure, intubated due to pneumonia. Underwent a left AKA    PULMONARY A: Prolonged VDRF Failed extubation attempt Bilateral pulmonary infiltrates and fever- c/w  PNA P:   Cont vent support -  Cont vent bundle Daily SBT if/when meets criteria Antibiotics as above  CXR in am Repeat respiratory cultures  Will need trach for survival-trach pending  CARDIOVASCULAR A:  NSTEMI PAF, NSR presently NSVT P:  MAP goal > 65 mmHg Cont ASA Continue amiodarone  Hemodynamics per ICU protocol  RENAL A:   AKI, nonoliguric, creatinine stable at 1.35 Very poor candidate for HD P:   Monitor BMET intermittently Monitor I/Os Correct electrolytes as indicated Increase free water flushes  GASTROINTESTINAL A:   No issues P:   SUP: enteral PPI -now with bowel/stomach perforation-follow up gen surgery recs   HEMATOLOGIC A:   ICU acquired anemia without acute blood loss P:  DVT px: SQ heparin Monitor CBC intermittently Transfuse per usual guidelines   INFECTIOUS A:   Severe sepsis-persistent leukocytosis and fever LLE gangrene Aspiration PNA P:   -Vanco and zosyn, antifungal therapy -Repeat respirtory cultures and f/u pending culture - tylenol and motrin prn for fever >103 -follow up ID recs  ENDOCRINE A:   Type 2 DM-Persistent  hyperglycemia P:   -started insulin infusion Blood gluocose monitoring per ICU protocol  NEUROLOGIC A:   Multifocal CVAs, acute Acute encewphalopathy H/O seizures - not on chronic AEDs PTA P:   RASS goal: -1, -2 Fentanyl gtt and prn versed for vent sedation       The Patient requires high complexity decision making for assessment and support, frequent evaluation and titration of therapies, application of advanced monitoring technologies and extensive interpretation of multiple databases. Critical Care Time devoted to patient care services described in this note is 40 minutes.   Overall, patient is critically ill, prognosis is guarded.  Patient with Multiorgan failure and at high risk for cardiac arrest and death.  Will proceed with TRACH PEG tube placement to be assessed at a later time  Corrin Parker, M.D.  [facility]  Medical Director [place] Director [facility] Cardio-Pulmonary Department

## 2015-12-26 NOTE — Progress Notes (Signed)
Spoke with Vascular Surgery PA, Joelene Millin, and Dr. Rayann Heman about scheduled PEG tube placement in radiology. Had been informed by the night RN that after talking with several Radiologists, they were planning to proceed with PEG tube placement today (12/26/15). Joelene Millin PA and Dr. Rayann Heman stated that plan was to reassess patient and the earliest potential PEG tube placement would be Monday. Joelene Millin PA spoke with radiology and canceled procedure that has been scheduled for today.

## 2015-12-26 NOTE — Progress Notes (Signed)
Nutrition Follow-up      INTERVENTION:   PN: discussed nutritional poc during ICU rounds and received verbal order from MD Kasa to start TPN; pt with L triple lumen PICC with dedicated port available for TPN. Recommend starting 5%AA/15%Dextrose at rate of 41 ml/hr (1 Liter volume in 24 hour) with goal of 83 ml/hr (100 g of protein, 1415 kcals, 1992 mL). No BMP/CMP since 3/1, recommend checking CMP with phosphorus and magnesium today prior to TPN initiation tonight. Recommend TPN panel in AM. Pharmacy consult for electrolyte and glucose management; daily weights, strict I/O.    NUTRITION DIAGNOSIS:   Inadequate oral intake related to inability to eat as evidenced by NPO status. Being addressed via TPN  GOAL:   Patient will meet greater than or equal to 90% of their needs  MONITOR:    (Energy Intake, Electrolyte and renal Profile, Anthropometrics, Digestive System, Glucose Profile, Pulmonary Profile)  REASON FOR ASSESSMENT:   Consult New TPN/TNA  ASSESSMENT:   Pt remains on vent, possible perforation of colon or stomach, no plans for surgical intervention, family wanting aggressive care at this point with plan fortrach   Diet Order:  Diet NPO time specified .TPN (CLINIMIX-E) Adult  Skin:   (stage II pressure ulcer on sacrum and lip)  Last BM:  12/07/2015   Digestive System: NG in place, possible perforation of colon or stomach, rectal tube removed   Recent Labs Lab 12/20/15 0542  12/22/15 0204 12/22/15 0819 12/22/15 1036 12/23/15 0533 12/24/15 1032  NA 140  < > 146*  --   --  147* 144  K 3.8  < > 3.2*  --  3.6 3.3* 4.3  CL 113*  < > 114*  --   --  113* 112*  CO2 22  < > 23  --   --  27 24  BUN 32*  < > 37*  --   --  45* 38*  CREATININE 1.32*  < > 1.34*  --   --  1.19 1.23  CALCIUM 6.8*  < > 7.4*  --   --  7.7* 7.8*  MG 1.8  --  2.2  --   --  1.9  --   PHOS 2.3*  < > 2.4* 3.5  --  2.7  --   GLUCOSE 186*  < > 281*  --   --  225* 210*  < > = values in this  interval not displayed.  Glucose Profile:   Recent Labs  12/25/15 2347 12/26/15 0420 12/26/15 0733  GLUCAP 163* 168* 186*   Meds: ss novolog, fentanyl drip  Urine Volume: UOP 1600 mL in past 24 hours  Height: estimated/measured length of 44 inches post bilateral amputations  Ht Readings from Last 1 Encounters:  12/05/15 '6\' 4"'$  (1.93 m)    Weight:   Wt Readings from Last 1 Encounters:  12/26/15 130 lb 1.1 oz (59 kg)   Filed Weights   12/24/15 0537 12/25/15 0500 12/26/15 0500  Weight: 132 lb 11.5 oz (60.2 kg) 134 lb 7.7 oz (61 kg) 130 lb 1.1 oz (59 kg)    BMI:  Body mass index is 15.84 kg/(m^2).  Estimated Nutritional Needs:   Kcal:  1371 kcals (Ve: 8.7, Tmax: 38.4) using wt of 59 kg, length of 44 inches  Protein:  89-118 g (1.5-2.0 g/kg)   Fluid:  1770-2065 mL (30-35 ml/kg)   EDUCATION NEEDS:   No education needs identified at this time  [place] MS,  RD, LDN [phone] Pager  [phone] Weekend/On-Call Pager

## 2015-12-26 NOTE — Progress Notes (Signed)
[facility]  Daily Progress Note   Subjective    Patient remains intubated sedated and critically ill no significant changes low-grade fever persists  Objective Filed Vitals:   12/26/15 1500 12/26/15 1522 12/26/15 1600 12/26/15 1700  BP: 132/79  135/80 136/83  Pulse:      Temp: 100.6 F (38.1 C)  100.6 F (38.1 C) 100.8 F (38.2 C)  TempSrc: Rectal  Rectal Rectal  Resp: '16  22 24  '$ Height:      Weight:      SpO2: 100% 100% 100% 100%    Intake/Output Summary (Last 24 hours) at 12/26/15 1751 Last data filed at 12/26/15 1700  Gross per 24 hour  Intake 1128.12 ml  Output   1775 ml  Net -646.88 ml    PULM  Normal effort , no use of accessory muscles CV  No JVD, RRR Abd      No distended, nontender VASC  bilateral above-knee indications incision clean dry and intact  Laboratory CBC    Component Value Date/Time   WBC 18.5* 12/24/2015 1032   WBC 4.9 10/25/2014 1331   HGB 7.6* 12/24/2015 1032   HGB 12.1* 10/25/2014 1331   HCT 25.0* 12/24/2015 1032   HCT 38.1* 10/25/2014 1331   PLT 474* 12/24/2015 1032   PLT 151 10/25/2014 1331    BMET    Component Value Date/Time   NA 147* 12/26/2015 1109   NA 138 10/25/2014 1331   K 3.8 12/26/2015 1109   K 3.9 10/25/2014 1331   CL 112* 12/26/2015 1109   CL 109* 10/25/2014 1331   CO2 25 12/26/2015 1109   CO2 21 10/25/2014 1331   GLUCOSE 229* 12/26/2015 1109   GLUCOSE 232* 10/25/2014 1331   BUN 33* 12/26/2015 1109   BUN 17 10/25/2014 1331   CREATININE 1.34* 12/26/2015 1109   CREATININE 1.11 10/25/2014 1331   CALCIUM 8.1* 12/26/2015 1109   CALCIUM 8.7 10/25/2014 1331   GFRNONAA 52* 12/26/2015 1109   GFRNONAA >60 10/25/2014 1331   GFRNONAA 52* 07/08/2014 1051   GFRAA >60 12/26/2015 1109   GFRAA >60 10/25/2014 1331   GFRAA >60 07/08/2014 1051    Assessment/Planning:   Continuing supportive care no changes per vascular this time    [name]  12/26/2015, 5:51 PM

## 2015-12-26 NOTE — Clinical Social Work Note (Signed)
No CSW consult at this time however CSW has reviewed due to length of stay. CSW spoke with RN CM and they are working on LTAC level of care for patient. Lurline Idol not anticipated prior to next Tuesday. CSW has noted vascular surgery's documentation that patient's son and daughter wish for aggressive care at this time. If patient is denied for LTAC, CSW will speak with patient's children regarding vent/SNF level of care. Shela Leff MSW,LCSW [phone]

## 2015-12-26 NOTE — Progress Notes (Signed)
Family Conference last night with Dr. Perrin Maltese and Dr. Delana Meyer resulted in the decision to not perform any surgical intervention at this point. She is visited and discussed with the RN the RN notified me that a colon contrast study had been ordered. He is not sure who ordered that contrast study. Dr. Tamala Julian I had discussed with Dr Earley Brooke last night that no further investigation including CAT scans etc. needed to be done since the decision to not perform surgery had already been decided upon by surgical team and family.  RN reports no new problems. Abdomen is soft and completely nontender but slightly distended and tympanitic he awakens to voice and does not grimace when examined.   Possible perforation of viscus whether it's colon or stomach is unclear but family and surgical team is decided that because of the patient's grave medical condition no surgical intervention would be entertained and this was all discussed at a family conference last night I've discussed this with Dr. Perrin Maltese and with Dr. Delana Meyer. Would not recommend doing any additional studies as that would not change our overall plan. There are no surgical indications at this point. As discussed with the RN

## 2015-12-26 NOTE — Progress Notes (Signed)
Spoke with Dr. Mortimer Fries about patient's oral medications with issues with GI system. MD ordered medications to be switched to IV (or rectally with tylenol). MD ordered amiodarone to be discontinued at this time.

## 2015-12-26 NOTE — Progress Notes (Signed)
PARENTERAL NUTRITION CONSULT NOTE - INITIAL  Pharmacy Consult for TPN Electrolyte and Glucose Monitoirng  Indication: Bowel Perforation  No Known Allergies  Patient Measurements: Height: '6\' 4"'$  (193 cm) Weight: 130 lb 1.1 oz (59 kg) IBW/kg (Calculated) : 86.8   Vital Signs: Temp: 100.9 F (38.3 C) (03/03 1100) Temp Source: Rectal (03/03 1100) BP: 128/73 mmHg (03/03 1100) Intake/Output from previous day: 03/02 0701 - 03/03 0700 In: 1254.7 [I.V.:554.7; IV Piggyback:700] Out: 1600 [Urine:1600] Intake/Output from this shift: Total I/O In: 41 [I.V.:41] Out: 270 [Urine:270]  Labs:  Recent Labs  12/24/15 1032  WBC 18.5*  HGB 7.6*  HCT 25.0*  PLT 474*     Recent Labs  12/24/15 1032 12/26/15 1109  NA 144 147*  K 4.3 3.8  CL 112* 112*  CO2 24 25  GLUCOSE 210* 229*  BUN 38* 33*  CREATININE 1.23 1.34*  CALCIUM 7.8* 8.1*  MG  --  1.9  PHOS  --  3.5  PROT  --  6.5  ALBUMIN  --  1.8*  AST  --  20  ALT  --  31  ALKPHOS  --  101  BILITOT  --  1.1   Estimated Creatinine Clearance: 42.8 mL/min (by C-G formula based on Cr of 1.34).    Recent Labs  12/25/15 2347 12/26/15 0420 12/26/15 0733  GLUCAP 163* 168* 186*    Medical History: Past Medical History  Diagnosis Date  . Hyperlipidemia   . Hypertension   . Diabetes mellitus ([place])   . CAD (coronary artery disease)     a. s/p 2 v CABG in 2012 (LIMA-LAD & SVG-OM); b. cath 07/2012 s/p PCI/DES to ostial LCx and OM  . PAD (peripheral artery disease) ([place])     a. s/p right SFA stent; b. s/p right toe amputation 2011; c. s/p right AKA spring 2016  . Seizures ([place])   . Gangrene of foot ([place])   . Wheezing   . Lung cancer ([place])   . Prostate cancer ([place])   . Stroke [facility])     Medications:  Scheduled:  . anidulafungin  100 mg Intravenous Q24H  . antiseptic oral rinse  7 mL Mouth Rinse 10 times per day  . chlorhexidine gluconate  15 mL Mouth Rinse BID  . enoxaparin (LOVENOX) injection  40 mg Subcutaneous Q24H   . insulin aspart  2-6 Units Subcutaneous 6 times per day  . pantoprazole (PROTONIX) IV  40 mg Intravenous Q24H  . piperacillin-tazobactam (ZOSYN)  IV  3.375 g Intravenous 3 times per day  . vancomycin  750 mg Intravenous Q18H   Infusions:  . Marland KitchenTPN (CLINIMIX-E) Adult    . fentaNYL 10 mcg/ml infusion 125 mcg/hr (12/26/15 1043)  . norepinephrine (LEVOPHED) Adult infusion      Insulin Requirements in the past 24 hours:    Current Nutrition:  Clinimix E 5/15 at 40 ml/hr  Assessment: 70 y/o M with multiple medical problems including bowel perforation to begin TPN therapy.   Plan:  Patient is on SSI which will be continued while patient is on TPN.   Electrolytes are WNL today. Will f/u AM labs.   Pharmacy will continue to monitor and adust per consult.   [name] 12/26/2015,12:00 PM

## 2015-12-27 ENCOUNTER — Inpatient Hospital Stay: Payer: PPO

## 2015-12-27 DIAGNOSIS — K668 Other specified disorders of peritoneum: Secondary | ICD-10-CM

## 2015-12-27 LAB — COMPREHENSIVE METABOLIC PANEL
ALT: 26 U/L (ref 17–63)
ANION GAP: 7 (ref 5–15)
AST: 20 U/L (ref 15–41)
Albumin: 1.9 g/dL — ABNORMAL LOW (ref 3.5–5.0)
Alkaline Phosphatase: 111 U/L (ref 38–126)
BUN: 32 mg/dL — ABNORMAL HIGH (ref 6–20)
CHLORIDE: 113 mmol/L — AB (ref 101–111)
CO2: 28 mmol/L (ref 22–32)
CREATININE: 1.24 mg/dL (ref 0.61–1.24)
Calcium: 7.9 mg/dL — ABNORMAL LOW (ref 8.9–10.3)
GFR calc non Af Amer: 57 mL/min — ABNORMAL LOW (ref >60)
Glucose, Bld: 326 mg/dL — ABNORMAL HIGH (ref 65–99)
POTASSIUM: 3.6 mmol/L (ref 3.5–5.1)
SODIUM: 148 mmol/L — AB (ref 135–145)
Total Bilirubin: 0.8 mg/dL (ref 0.3–1.2)
Total Protein: 6.4 g/dL — ABNORMAL LOW (ref 6.5–8.1)

## 2015-12-27 LAB — CBC
HCT: 25.6 % — ABNORMAL LOW (ref 40.0–52.0)
HEMOGLOBIN: 7.9 g/dL — AB (ref 13.0–18.0)
MCH: 28 pg (ref 26.0–34.0)
MCHC: 31.1 g/dL — ABNORMAL LOW (ref 32.0–36.0)
MCV: 90.3 fL (ref 80.0–100.0)
Platelets: 486 10*3/uL — ABNORMAL HIGH (ref 150–440)
RBC: 2.83 MIL/uL — AB (ref 4.40–5.90)
RDW: 17.5 % — ABNORMAL HIGH (ref 11.5–14.5)
WBC: 18.5 10*3/uL — AB (ref 3.8–10.6)

## 2015-12-27 LAB — GLUCOSE, CAPILLARY
GLUCOSE-CAPILLARY: 293 mg/dL — AB (ref 65–99)
GLUCOSE-CAPILLARY: 281 mg/dL — AB (ref 65–99)
GLUCOSE-CAPILLARY: 226 mg/dL — AB (ref 65–99)
GLUCOSE-CAPILLARY: 214 mg/dL — AB (ref 65–99)
GLUCOSE-CAPILLARY: 300 mg/dL — AB (ref 65–99)
Glucose-Capillary: 329 mg/dL — ABNORMAL HIGH (ref 65–99)

## 2015-12-27 LAB — TRIGLYCERIDES: TRIGLYCERIDES: 139 mg/dL (ref <150)

## 2015-12-27 LAB — MAGNESIUM: Magnesium: 2 mg/dL (ref 1.7–2.4)

## 2015-12-27 LAB — PHOSPHORUS: PHOSPHORUS: 3.2 mg/dL (ref 2.5–4.6)

## 2015-12-27 MED ORDER — INSULIN ASPART 100 UNIT/ML ~~LOC~~ SOLN
0.0000 [IU] | SUBCUTANEOUS | Status: DC
  Administered 2015-12-27 (×3): 8 [IU] via SUBCUTANEOUS
  Administered 2015-12-27 (×2): 5 [IU] via SUBCUTANEOUS
  Administered 2015-12-28: 11 [IU] via SUBCUTANEOUS
  Filled 2015-12-27: qty 8
  Filled 2015-12-27: qty 5
  Filled 2015-12-27: qty 8
  Filled 2015-12-27: qty 5
  Filled 2015-12-27: qty 8
  Filled 2015-12-27: qty 11

## 2015-12-27 MED ORDER — INSULIN GLARGINE 100 UNIT/ML ~~LOC~~ SOLN
10.0000 [IU] | Freq: Every day | SUBCUTANEOUS | Status: DC
  Administered 2015-12-27: 10 [IU] via SUBCUTANEOUS
  Filled 2015-12-27 (×2): qty 0.1

## 2015-12-27 MED ORDER — INSULIN GLARGINE 100 UNIT/ML ~~LOC~~ SOLN
10.0000 [IU] | Freq: Every day | SUBCUTANEOUS | Status: DC
  Filled 2015-12-27: qty 0.1

## 2015-12-27 MED ORDER — TRACE MINERALS CR-CU-MN-SE-ZN 10-1000-500-60 MCG/ML IV SOLN
INTRAVENOUS | Status: AC
  Administered 2015-12-27: 18:00:00 via INTRAVENOUS
  Filled 2015-12-27: qty 1992

## 2015-12-27 NOTE — Progress Notes (Signed)
3 Days Post-Op  Subjective: No changes overnight  Objective: Vital signs in last 24 hours: Temp:  [99 F (37.2 C)-101.1 F (38.4 C)] 99 F (37.2 C) (03/04 0800) Resp:  [15-27] 15 (03/04 0800) BP: (102-141)/(62-89) 114/65 mmHg (03/04 0800) SpO2:  [100 %] 100 % (03/04 0800) FiO2 (%):  [30 %] 30 % (03/04 0900) Weight:  [58.1 kg (128 lb 1.4 oz)] 58.1 kg (128 lb 1.4 oz) (03/04 0432) Last BM Date: 12/26/15  Intake/Output from previous day: 03/03 0701 - 03/04 0700 In: 1223.5 [I.V.:310.3; IV Piggyback:430; TPN:483.1] Out: 7062 [Urine:1435] Intake/Output this shift: Total I/O In: 17.5 [I.V.:17.5] Out: -   Gen: Intubated, opens eyes to voice CV: RR Lungs: CTA ABD: Soft, ND EXT: palp femoral pulses, right stump C/D/I  Lab Results:   Recent Labs  12/24/15 1032 12/27/15 0419  WBC 18.5* 18.5*  HGB 7.6* 7.9*  HCT 25.0* 25.6*  PLT 474* 486*   BMET  Recent Labs  12/26/15 1109 12/27/15 0419  NA 147* 148*  K 3.8 3.6  CL 112* 113*  CO2 25 28  GLUCOSE 229* 326*  BUN 33* 32*  CREATININE 1.34* 1.24  CALCIUM 8.1* 7.9*   PT/INR No results for input(s): LABPROT, INR in the last 72 hours. ABG  Recent Labs  12/25/15 1000  PHART 7.52*  HCO3 26.1    Studies/Results: No results found.  Anti-infectives: Anti-infectives    Start     Dose/Rate Route Frequency Ordered Stop   12/26/15 0600  vancomycin (VANCOCIN) IVPB 750 mg/150 ml premix     750 mg 150 mL/hr over 60 Minutes Intravenous Every 18 hours 12/26/15 0526     12/24/15 0000  vancomycin (VANCOCIN) IVPB 1000 mg/200 mL premix  Status:  Discontinued     1,000 mg 200 mL/hr over 60 Minutes Intravenous Every 18 hours 12/23/15 2300 12/26/15 0526   12/23/15 1800  anidulafungin (ERAXIS) 100 mg in sodium chloride 0.9 % 100 mL IVPB     100 mg over 90 Minutes Intravenous Every 24 hours 12/22/15 2016     12/22/15 2130  anidulafungin (ERAXIS) 200 mg in sodium chloride 0.9 % 200 mL IVPB     200 mg over 180 Minutes  Intravenous  Once 12/22/15 2016 12/23/15 0046   12/21/15 1630  vancomycin (VANCOCIN) IVPB 750 mg/150 ml premix  Status:  Discontinued     750 mg 150 mL/hr over 60 Minutes Intravenous Every 18 hours 12/21/15 0936 12/23/15 2259   12/21/15 0945  vancomycin (VANCOCIN) IVPB 750 mg/150 ml premix     750 mg 150 mL/hr over 60 Minutes Intravenous STAT 12/21/15 0936 12/21/15 1051   12/19/15 1701  cefTAZidime (FORTAZ) 1 g in dextrose 5 % 50 mL IVPB  Status:  Discontinued     1 g 100 mL/hr over 30 Minutes Intravenous Every 8 hours 12/19/15 1116 12/19/15 1523   12/19/15 1530  piperacillin-tazobactam (ZOSYN) IVPB 3.375 g     3.375 g 12.5 mL/hr over 240 Minutes Intravenous 3 times per day 12/19/15 1524     12/19/15 1300  vancomycin (VANCOCIN) IVPB 1000 mg/200 mL premix  Status:  Discontinued     1,000 mg 200 mL/hr over 60 Minutes Intravenous Every 24 hours 12/18/15 1614 12/19/15 0925   12/18/15 2200  cefTAZidime (FORTAZ) 1 g in dextrose 5 % 50 mL IVPB  Status:  Discontinued     1 g 100 mL/hr over 30 Minutes Intravenous Every 12 hours 12/18/15 1616 12/19/15 1116   12/18/15 1430  metroNIDAZOLE (FLAGYL)  IVPB 500 mg  Status:  Discontinued     500 mg 100 mL/hr over 60 Minutes Intravenous Every 8 hours 12/18/15 1412 12/19/15 1523   12/17/15 1000  cefTAZidime (FORTAZ) 1 g in dextrose 5 % 50 mL IVPB  Status:  Discontinued     1 g 100 mL/hr over 30 Minutes Intravenous Every 24 hours 12/16/15 0638 12/18/15 1616   12/17/15 0200  vancomycin (VANCOCIN) 1,250 mg in sodium chloride 0.9 % 250 mL IVPB  Status:  Discontinued     1,250 mg 166.7 mL/hr over 90 Minutes Intravenous Every 36 hours 12/16/15 0635 12/18/15 1614   12/16/15 0645  vancomycin (VANCOCIN) 1,250 mg in sodium chloride 0.9 % 250 mL IVPB  Status:  Discontinued     1,250 mg 166.7 mL/hr over 90 Minutes Intravenous  Once 12/16/15 0635 12/18/15 1623   12/16/15 0645  cefTAZidime (FORTAZ) 1 g in dextrose 5 % 50 mL IVPB  Status:  Discontinued     1 g 100  mL/hr over 30 Minutes Intravenous  Once 12/16/15 0638 12/19/15 1523   12/12/15 0600  ceFAZolin (ANCEF) IVPB 2 g/50 mL premix     2 g 100 mL/hr over 30 Minutes Intravenous On call to O.R. 12/11/15 1750 12/13/15 0559   12/10/15 0900  vancomycin (VANCOCIN) IVPB 750 mg/150 ml premix  Status:  Discontinued     750 mg 150 mL/hr over 60 Minutes Intravenous Every 36 hours 12/09/15 2301 12/10/15 1849   12/10/15 0600  vancomycin (VANCOCIN) IVPB 1000 mg/200 mL premix  Status:  Discontinued     1,000 mg 200 mL/hr over 60 Minutes Intravenous Every 24 hours 12/10/15 1849 12/11/15 1413   12/08/15 1800  vancomycin (VANCOCIN) IVPB 750 mg/150 ml premix  Status:  Discontinued     750 mg 150 mL/hr over 60 Minutes Intravenous Every 36 hours 12/07/15 0755 12/09/15 2301   12/07/15 0600  vancomycin (VANCOCIN) IVPB 750 mg/150 ml premix  Status:  Discontinued     750 mg 150 mL/hr over 60 Minutes Intravenous Every 18 hours 12/06/15 2020 12/07/15 0755   12/06/15 1930  vancomycin (VANCOCIN) IVPB 1000 mg/200 mL premix     1,000 mg 200 mL/hr over 60 Minutes Intravenous  Once 12/06/15 1854 12/06/15 2114   12/06/15 1515  piperacillin-tazobactam (ZOSYN) IVPB 3.375 g     3.375 g 12.5 mL/hr over 240 Minutes Intravenous 3 times per day 12/06/15 1502 12/15/15 0917   12/05/15 1445  [MAR Hold]  ceFAZolin (ANCEF) IVPB 1 g/50 mL premix  Status:  Discontinued     (MAR Hold since 12/06/15 0910)  Comments:  Send with pt to OR   1 g 100 mL/hr over 30 Minutes Intravenous 3 times per day 12/05/15 1442 12/06/15 1104   12/05/15 1245  cefUROXime (ZINACEF) 1.5 g in dextrose 5 % 50 mL IVPB     1.5 g 100 mL/hr over 30 Minutes Intravenous  Once 12/05/15 1234 12/05/15 1305      Assessment/Plan:  Stable from Vascular Standpoint. Continue supportive care  Jamesetta So A 12/27/2015

## 2015-12-27 NOTE — Progress Notes (Signed)
Pharmacy Anti-infective Note  Stephen Camacho is a 70 y.o. male admitted on 12/05/2015 with pneumonia.  Pharmacy has been consulted for vancomycin, Zosyn, and anidulafungin dosing.  Plan: Vancomycin dose decreased to 750 mg iv q 18 hours with slightly elevated trough. Next trough scheduled with the 4th dose of the new regimen on 3/5 at 1130.s Goal trough 15-20 mcg/mL.  Zosyn 3.375 g EI q 8 hours.   Anidulafungin '100mg'$  IV Q24hr.    Pharmacy will continue to monitor and adjust per consult.   Height: '6\' 4"'$  (193 cm) Weight: 128 lb 1.4 oz (58.1 kg) IBW/kg (Calculated) : 86.8  Temp (24hrs), Avg:100.3 F (37.9 C), Min:99 F (37.2 C), Max:101.1 F (38.4 C)   Recent Labs Lab 12/21/15 0419 12/22/15 0204 12/23/15 0533 12/23/15 2137 12/24/15 1032 12/26/15 0446 12/26/15 1109 12/27/15 0419  WBC 20.3* 22.6* 18.5*  --  18.5*  --   --  18.5*  CREATININE 1.35* 1.34* 1.19  --  1.23  --  1.34* 1.24  VANCOTROUGH  --   --   --  13  --  22*  --   --     Estimated Creatinine Clearance: 45.6 mL/min (by C-G formula based on Cr of 1.24).    No Known Allergies  Antimicrobials this admission: vancomycin 02/22 >> 02/23 ceftazidime 02/22 >> 02/24 Metronidazole 02/23 >> 02/24 Zosyn 02/24 >> Vancomycin 02/26 >> Anidulafungin 02/27>>  Dose adjustments this admission:   Microbiology results: 02/23 BCx: negative x 2 02/23 UCx: negative  02/21 Sputum: Candida albicans  02/22 MRSA PCR: negative 02/23 TA Candida albicans 02/26 TA moderate growth Candida albicans  Thank you for allowing pharmacy to be a part of this patient's care.  [name] 12/27/2015 9:41 AM

## 2015-12-27 NOTE — Progress Notes (Signed)
PARENTERAL NUTRITION CONSULT NOTE - INITIAL  Pharmacy Consult for TPN Electrolyte and Glucose Monitoirng  Indication: Bowel Perforation  No Known Allergies  Patient Measurements: Height: '6\' 4"'$  (193 cm) Weight: 128 lb 1.4 oz (58.1 kg) IBW/kg (Calculated) : 86.8   Vital Signs: Temp: 99.3 F (37.4 C) (03/04 0600) Temp Source: Rectal (03/04 0400) BP: 110/66 mmHg (03/04 0600) Intake/Output from previous day: 03/03 0701 - 03/04 0700 In: 1223.5 [I.V.:310.3; IV Piggyback:430; TPN:483.1] Out: 1435 [CBJSE:8315] Intake/Output from this shift: Total I/O In: 870.3 [I.V.:169.3; IV Piggyback:250; TPN:451] Out: 735 [Urine:735]  Labs:  Recent Labs  12/24/15 1032 12/27/15 0419  WBC 18.5* 18.5*  HGB 7.6* 7.9*  HCT 25.0* 25.6*  PLT 474* 486*     Recent Labs  12/24/15 1032 12/26/15 1109 12/27/15 0419  NA 144 147* 148*  K 4.3 3.8 3.6  CL 112* 112* 113*  CO2 '24 25 28  '$ GLUCOSE 210* 229* 326*  BUN 38* 33* 32*  CREATININE 1.23 1.34* 1.24  CALCIUM 7.8* 8.1* 7.9*  MG  --  1.9 2.0  PHOS  --  3.5 3.2  PROT  --  6.5 6.4*  ALBUMIN  --  1.8* 1.9*  AST  --  20 20  ALT  --  31 26  ALKPHOS  --  101 111  BILITOT  --  1.1 0.8  TRIG  --   --  139   Estimated Creatinine Clearance: 45.6 mL/min (by C-G formula based on Cr of 1.24).    Recent Labs  12/26/15 1956 12/26/15 2339 12/27/15 0331  GLUCAP 210* 255* 293*    Medical History: Past Medical History  Diagnosis Date  . Hyperlipidemia   . Hypertension   . Diabetes mellitus ([place] [address])   . CAD (coronary artery disease)     a. s/p 2 v CABG in 2012 (LIMA-LAD & SVG-OM); b. cath 07/2012 s/p PCI/DES to ostial LCx and OM  . PAD (peripheral artery disease) ([place])     a. s/p right SFA stent; b. s/p right toe amputation 2011; c. s/p right AKA spring 2016  . Seizures ([place])   . Gangrene of foot ([place])   . Wheezing   . Lung cancer ([place])   . Prostate cancer ([place])   . Stroke [facility])     Medications:  Scheduled:  . anidulafungin  100 mg  Intravenous Q24H  . antiseptic oral rinse  7 mL Mouth Rinse 10 times per day  . chlorhexidine gluconate  15 mL Mouth Rinse BID  . enoxaparin (LOVENOX) injection  40 mg Subcutaneous Q24H  . insulin aspart  0-15 Units Subcutaneous 6 times per day  . pantoprazole (PROTONIX) IV  40 mg Intravenous Q24H  . piperacillin-tazobactam (ZOSYN)  IV  3.375 g Intravenous 3 times per day  . vancomycin  750 mg Intravenous Q18H   Infusions:  . Marland KitchenTPN (CLINIMIX-E) Adult 41 mL/hr at 12/27/15 0600  . fentaNYL 10 mcg/ml infusion 175 mcg/hr (12/27/15 0600)  . norepinephrine (LEVOPHED) Adult infusion Stopped (12/26/15 2151)    Insulin Requirements in the past 24 hours:    Current Nutrition:  Clinimix E 5/15 at 40 ml/hr  Assessment: 70 y/o M with multiple medical problems including bowel perforation to begin TPN therapy.   Plan:  Patient is on SSI which will be continued while patient is on TPN.   Electrolytes are WNL today. Will f/u AM labs.   Pharmacy will continue to monitor and adust per consult.   Laural Benes, Pharm.D., BCPS Clinical Pharmacist 12/27/2015,6:54 AM

## 2015-12-27 NOTE — Progress Notes (Signed)
Stephen Camacho remains on vent. Does not follow commands. Also did not follow commands during sedation vacation and started breathing over vent, with agitation. Sedation turned back on. Daughter and son came to visit. Carolan Shiver, NP spoke with daughter today. Family is still wanting to be aggressive with his care. Foley in place. U/o adequate. Afebrile.

## 2015-12-27 NOTE — Progress Notes (Signed)
Nutrition Follow-up   INTERVENTION:   PN: Spoke with MD, Ashby Dawes agreeable to titrate TPN to goal rate of 21m/hr (100 g of protein, 1415 kcals, 1992 mL). MD aware that TPN is 5%AA/15% Dextrose and Pharmacy following for electrolyte and glucose management. Spoke with Teldr[place], Pharmacist, made aware of increase in TPN today, and he has made adjustments to insulin regimen for glucose coverage. Will continue to monitor and make recommendations accordingly.    NUTRITION DIAGNOSIS:   Inadequate oral intake related to inability to eat as evidenced by NPO status.  being addressed with TPN.  GOAL:   Patient will meet greater than or equal to 90% of their needs; ongoing   MONITOR:    (Energy Intake, Electrolyte and renal Profile, Anthropometrics, Digestive System, Glucose Profile, Pulmonary Profile)  REASON FOR ASSESSMENT:   Consult New TPN/TNA  ASSESSMENT:   Pt remains on vent, family wanting aggressive care at this point with plan for trach 12/30/2015. Surgery following; per note chest xray for persistence of free air and note if absent my consider OG tube feedings. Vascular following. Pt s/p b/l AKA.   Diet Order:  Diet NPO time specified .TPN (CLINIMIX-E) Adult .TPN (CLINIMIX-E) Adult   Current Nutrition: tolerating 5%AA/15% Dextrose at 458mhr this am   Gastrointestinal Profile: abdomen soft, minimal distension per MD note.    Scheduled Medications:  . anidulafungin  100 mg Intravenous Q24H  . antiseptic oral rinse  7 mL Mouth Rinse 10 times per day  . chlorhexidine gluconate  15 mL Mouth Rinse BID  . enoxaparin (LOVENOX) injection  40 mg Subcutaneous Q24H  . insulin aspart  0-15 Units Subcutaneous 6 times per day  . insulin glargine  10 Units Subcutaneous QHS  . pantoprazole (PROTONIX) IV  40 mg Intravenous Q24H  . piperacillin-tazobactam (ZOSYN)  IV  3.375 g Intravenous 3 times per day  . vancomycin  750 mg Intravenous Q18H    Continuous Medications:  . .TMarland KitchenN  (CLINIMIX-E) Adult 41 mL/hr at 12/27/15 0600  . .TMarland KitchenN (CLINIMIX-E) Adult    . fentaNYL 10 mcg/ml infusion 175 mcg/hr (12/27/15 0600)  . norepinephrine (LEVOPHED) Adult infusion Stopped (12/26/15 2151)     Electrolyte/Renal Profile and Glucose Profile:   Recent Labs Lab 12/23/15 0533 12/24/15 1032 12/26/15 1109 12/27/15 0419  NA 147* 144 147* 148*  K 3.3* 4.3 3.8 3.6  CL 113* 112* 112* 113*  CO2 '27 24 25 28  '$ BUN 45* 38* 33* 32*  CREATININE 1.19 1.23 1.34* 1.24  CALCIUM 7.7* 7.8* 8.1* 7.9*  MG 1.9  --  1.9 2.0  PHOS 2.7  --  3.5 3.2  GLUCOSE 225* 210* 229* 326*   Nutritional Anemia Profile:  CBC Latest Ref Rng 12/27/2015 12/24/2015 12/23/2015  WBC 3.8 - 10.6 K/uL 18.5(H) 18.5(H) 18.5(H)  Hemoglobin 13.0 - 18.0 g/dL 7.9(L) 7.6(L) 7.6(L)  Hematocrit 40.0 - 52.0 % 25.6(L) 25.0(L) 24.3(L)  Platelets 150 - 440 K/uL 486(H) 474(H) 396   Lipid Panel     Component Value Date/Time   TRIG 139 12/27/2015 0419    Protein Profile:  Recent Labs Lab 12/26/15 1109 12/27/15 0419  ALBUMIN 1.8* 1.9*     Weight Trend since Admission: Filed Weights   12/25/15 0500 12/26/15 0500 12/27/15 0432  Weight: 134 lb 7.7 oz (61 kg) 130 lb 1.1 oz (59 kg) 128 lb 1.4 oz (58.1 kg)     Skin:   (stage II pressure ulcer on sacrum and lip)   BMI:  Body mass index is 15.6  kg/(m^2).  Estimated Nutritional Needs:   Kcal:  1371 kcals (Ve: 8.7, Tmax: 38.4) using wt of 59 kg, length of 44 inches  Protein:  89-118 g (1.5-2.0 g/kg)   Fluid:  1770-2065 mL (30-35 ml/kg)   EDUCATION NEEDS:   No education needs identified at this time   [place], RD, LDN Pager [phone] Weekend/On-Call Pager [phone]

## 2015-12-27 NOTE — Progress Notes (Signed)
Called [facility] and spoke with Dr. Jimmy Footman r/t pt.'s rising blood sugars since starting TPN.  Pt. Currently on sensitive sliding scale, MD ordered change to moderate scale.  Will continue to monitor pt. Closely.

## 2015-12-27 NOTE — Progress Notes (Signed)
PULMONARY / CRITICAL CARE MEDICINE   Name: Stephen Camacho MRN: [id number] DOB: 01-29-1946    ADMISSION DATE:  12/05/2015   CONSULTATION DATE:  12/08/15  History of Present Illness:   70 YO male with 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 Now with acute respiratory failure, intubated due to pneumonia. Underwent a left AKA   SUBJECTIVE:  Remains sedated on the vent with normal WOB.  Fentanyl gtt and versed started;on zosyn/Vanc/antfungal therapy.  Complication from PEG tube placement s/p bowel/stomach perforation-gen surgery notes reviewed Awaiting peg and trach. Overall prognosis is poor   EVENTS/DATA: 02/10 >> LLE angiogram, thrombolysis, thrombectomy, angioplasty 02/11 Intubated for suspected PNA 02/11 elevated trop I. Pk trop I 62.6 on 02/20 02/12 CT head: New nonhemorrhagic infarcts involving the right PCA territory and bilateral cerebellum, left greater than right 02/17 L AKA 02/18>extubated 02/21 TTE: LVEF 55-60% 02/21 RUE [place]: no DVT 02/21>Re-intubated 02/22 >CT head: Interval evolution of bilateral cerebellar and right PCA territory infarcts 02/27-remains intubated,discussion with family-proceed with trach and PEG  INDWELLING DEVICES:: ETT 02/11 >> 02/18, 02/21 >>  R IJ CVL 02/10 >> removed PICC line RT  MICRO DATA: MRSA PCR 02/13 >> NEG Urine 02/12 >> NEG Resp 02/11 >> MSSA C diff 02/16 >> NEG Blood 02/12 >> NEG Urine 02/19 >> NEG C diff 02/21 >> NEG Cath tip 02/19 >> NEG Resp 02/18 >> light growth candida Urine 02/21 >> NEG MRSA PCR 02/22 >> NEG Respiratory 02/26>>  ANTIMICROBIALS:  Vanc 02/11 >> 02/23 Pip-tazo 02/11 >> 02/20 Ceftaz 02/21 >> 02/24 Metronidazole 02/21 >> 02/24 Pip-tazo 02/24 >>  Vancomycin 02/25>>    VITAL SIGNS: BP 100/57 mmHg   Pulse 98  Temp(Src) 98.2 F (36.8 C) (Rectal)  Resp 15  Ht '6\' 4"'$  (1.93 m)  Wt 128 lb 1.4 oz (58.1 kg)  BMI 15.60 kg/m2  SpO2 99%  HEMODYNAMICS:    VENTILATOR SETTINGS: Vent Mode:  [-] PRVC FiO2 (%):  [30 %] 30 % Set Rate:  [15 bmp] 15 bmp Vt Set:  [500 mL] 500 mL PEEP:  [5 cmH20] 5 cmH20 Plateau Pressure:  [17 cmH20] 17 cmH20  INTAKE / OUTPUT: I/O last 3 completed shifts: In: 1773.5 [I.V.:610.3; IV Piggyback:680] Out: 2285 [Urine:2285]  PHYSICAL EXAMINATION: General: Chronically ill appearing;+resp distress, on vent Neuro: CNs intact, MAEs, agitated on WUA HEENT: NCAT, ETT in place Cardiovascular: RRR, S1/S2, no MRG Lungs: Bilateral airflow, no wheezes Abdomen: Soft, NT, +BS Ext: B AKA, L surgical wound clean and dry  LABS:  BMET  Recent Labs Lab 12/24/15 1032 12/26/15 1109 12/27/15 0419  NA 144 147* 148*  K 4.3 3.8 3.6  CL 112* 112* 113*  CO2 '24 25 28  '$ BUN 38* 33* 32*  CREATININE 1.23 1.34* 1.24  GLUCOSE 210* 229* 326*    Electrolytes  Recent Labs Lab 12/23/15 0533 12/24/15 1032 12/26/15 1109 12/27/15 0419  CALCIUM 7.7* 7.8* 8.1* 7.9*  MG 1.9  --  1.9 2.0  PHOS 2.7  --  3.5 3.2    CBC  Recent Labs Lab 12/23/15 0533 12/24/15 1032 12/27/15 0419  WBC 18.5* 18.5* 18.5*  HGB 7.6* 7.6* 7.9*  HCT 24.3* 25.0* 25.6*  PLT 396 474* 486*    Coag's No results for input(s): APTT, INR in the last 168 hours.  Sepsis Markers No results for input(s): LATICACIDVEN, PROCALCITON, O2SATVEN in the  last 168 hours.  ABG  Recent Labs Lab 12/22/15 0850 12/25/15 1000  PHART 7.40 7.52*  PCO2ART 40 32  PO2ART 67* 124*    Liver Enzymes  Recent Labs Lab 12/26/15 1109 12/27/15 0419  AST 20 20  ALT 31 26  ALKPHOS 101 111  BILITOT 1.1 0.8  ALBUMIN 1.8* 1.9*    Cardiac Enzymes  Recent Labs Lab 12/21/15 0419  TROPONINI 0.28*    Glucose  Recent Labs Lab 12/26/15 1156 12/26/15 1619 12/26/15 1956 12/26/15 2339 12/27/15 0331  12/27/15 0721  GLUCAP 197* 172* 210* 255* 293* 281*   Dg Chest 1 View  12/27/2015  CLINICAL DATA:  History of right lung lobectomy and CABG with PEG placement 3 days ago, evaluate for free intraperitoneal air EXAM: CHEST 1 VIEW COMPARISON:  12/22/2015 FINDINGS: No change in endotracheal tube, NG tube, or left PICC line. Diffuse bilateral airspace disease again identified. Improved aeration in the previously consolidated left lower lobe. On the right, there is increased opacity involving the mid to lower lung zone. No pneumoperitoneum identified on this upright image. IMPRESSION: Continued bilateral airspace disease, with some improvement in the left lower lobe but with interval increase in severity of consolidation right mid to lower lung zone. Pneumoperitoneum not identified. Electronically Signed   By: Skipper Cliche M.D.   On: 12/27/2015 10:29   CXR 02/25: patchy B interstitial and alveolar infiltrates  ASSESSMENT / PLAN: 71 YO male with 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 Now with acute respiratory failure, intubated due to pneumonia. Underwent a left AKA    PULMONARY A: Prolonged VDRF Failed extubation attempt Bilateral pulmonary infiltrates and fever- c/w  PNA P:   -Cont vent support -  -Cont vent bundle -Daily SBT if/when meets criteria -Antibiotics as above  -CXR in am -f/u respiratory cultures  -Trach decision pending  CARDIOVASCULAR A:  NSTEMI PAF, NSR presently NSVT P:  -MAP goal > 65 mmHg -Cont ASA -Continue amiodarone  -Hemodynamics per ICU protocol  RENAL A:   AKI, nonoliguric, creatinine stable at 1.35 Very poor candidate for HD P:   -Monitor BMET intermittently -Monitor I/Os -Correct electrolytes as indicated -Continue free water  flushes  GASTROINTESTINAL A:   No issues P:   -SUP: enteral PPI -Now with bowel/stomach perforation -Awaiting GI and surgery recs  HEMATOLOGIC A:   ICU acquired anemia without acute blood loss P:  -DVT px: SQ heparin -Monitor CBC intermittently -Transfuse per usual guidelines   INFECTIOUS A:   Severe sepsis-persistent leukocytosis and fever LLE gangrene Aspiration PNA P:   -Vanco and zosyn, antifungal therapy -Repeat respirtory cultures and f/u pending culture - tylenol and motrin prn for fever >103 -follow up ID recs  ENDOCRINE A:   Type 2 DM-Persistent hyperglycemia P:   -off insulin infusion -Continue blood gluocose monitoring per ICU protocol -Change sliding scale to resistant  NEUROLOGIC A:   Multifocal CVAs, acute Acute encewphalopathy H/O seizures - not on chronic AEDs PTA P:   RASS goal: -1, -2 -Fentanyl gtt and prn versed for vent sedation  Family update/Disposition: Plan is to re-approach family about comfort care at this point.   CCM time =40 minutes  Magdalene S. Tukov ANP-BC Pulmonary and Critical Care Medicine [facility] Pager [phone]   The patient was seen and examined with the nurse practitioner, I have reviewed and agree with  the assessment and plan as stated above.  Marda Stalker, M.D.   Critical Care Attestation.  I have personally obtained a history, examined the patient, evaluated laboratory and imaging results, formulated the assessment and plan and placed orders. The Patient requires high complexity decision making for assessment and support, frequent evaluation and titration of therapies, application of advanced monitoring technologies and extensive interpretation of multiple databases. The patient has critical illness that could lead imminently to failure of 1 or more organ systems and requires the highest level of physician preparedness to intervene.  Critical Care Time devoted to patient care services described  in this note is 35 minutes and is exclusive of time spent in procedures.

## 2015-12-27 NOTE — Progress Notes (Signed)
[facility] Progress Note Patient Name: Stephen Camacho DOB: 15-May-1946 MRN: [id number]   Date of Service  12/27/2015  HPI/Events of Note  Hyperglycemia on sensitive SSI  eICU Interventions  Changed to moderate SSI     Intervention Category Intermediate Interventions: Hyperglycemia - evaluation and treatment  DETERDING,ELIZABETH 12/27/2015, 3:42 AM

## 2015-12-27 NOTE — Progress Notes (Addendum)
Tmax 100.6, VSS. Sedated on vent. Opens eyes in response to touch. BM yesterday. Lungs: Clear. Cardio: RR. ABD: Minimal distension, soft. Labs: WBC unchanged at 18K. Stable anemia at 7.9. TPN started. Will check CXR re: persistence of free air. If absent, could consider OG feedings in place of TPN with less dramatic glycemic swings.   CXR reviewed: Scant residual free air under left hemidiaphragm. No small intestine dilatation. CT contrast in left colon. Could consider use of NG for enteral feedings. NG is in the stomach.

## 2015-12-27 NOTE — Progress Notes (Signed)
PARENTERAL NUTRITION CONSULT NOTE - INITIAL  Pharmacy Consult for TPN Electrolyte and Glucose Monitoirng  Indication: Bowel Perforation  No Known Allergies  Patient Measurements: Height: '6\' 4"'$  (193 cm) Weight: 128 lb 1.4 oz (58.1 kg) IBW/kg (Calculated) : 86.8   Vital Signs: Temp: 99 F (37.2 C) (03/04 0800) Temp Source: Rectal (03/04 0400) BP: 114/65 mmHg (03/04 0800) Intake/Output from previous day: 03/03 0701 - 03/04 0700 In: 1223.5 [I.V.:310.3; IV Piggyback:430; TPN:483.1] Out: 7564 [Urine:1435] Intake/Output from this shift: Total I/O In: 17.5 [I.V.:17.5] Out: -   Labs:  Recent Labs  12/24/15 1032 12/27/15 0419  WBC 18.5* 18.5*  HGB 7.6* 7.9*  HCT 25.0* 25.6*  PLT 474* 486*     Recent Labs  12/24/15 1032 12/26/15 1109 12/27/15 0419  NA 144 147* 148*  K 4.3 3.8 3.6  CL 112* 112* 113*  CO2 '24 25 28  '$ GLUCOSE 210* 229* 326*  BUN 38* 33* 32*  CREATININE 1.23 1.34* 1.24  CALCIUM 7.8* 8.1* 7.9*  MG  --  1.9 2.0  PHOS  --  3.5 3.2  PROT  --  6.5 6.4*  ALBUMIN  --  1.8* 1.9*  AST  --  20 20  ALT  --  31 26  ALKPHOS  --  101 111  BILITOT  --  1.1 0.8  TRIG  --   --  139   Estimated Creatinine Clearance: 45.6 mL/min (by C-G formula based on Cr of 1.24).    Recent Labs  12/26/15 2339 12/27/15 0331 12/27/15 0721  GLUCAP 255* 293* 281*    Medical History: Past Medical History  Diagnosis Date  . Hyperlipidemia   . Hypertension   . Diabetes mellitus ([place])   . CAD (coronary artery disease)     a. s/p 2 v CABG in 2012 (LIMA-LAD & SVG-OM); b. cath 07/2012 s/p PCI/DES to ostial LCx and OM  . PAD (peripheral artery disease) ([place])     a. s/p right SFA stent; b. s/p right toe amputation 2011; c. s/p right AKA spring 2016  . Seizures ([place])   . Gangrene of foot ([place])   . Wheezing   . Lung cancer ([place])   . Prostate cancer ([place])   . Stroke [facility])     Medications:  Scheduled:  . anidulafungin  100 mg Intravenous Q24H  . antiseptic oral rinse  7  mL Mouth Rinse 10 times per day  . chlorhexidine gluconate  15 mL Mouth Rinse BID  . enoxaparin (LOVENOX) injection  40 mg Subcutaneous Q24H  . insulin aspart  0-15 Units Subcutaneous 6 times per day  . insulin glargine  10 Units Subcutaneous QHS  . pantoprazole (PROTONIX) IV  40 mg Intravenous Q24H  . piperacillin-tazobactam (ZOSYN)  IV  3.375 g Intravenous 3 times per day  . vancomycin  750 mg Intravenous Q18H   Infusions:  . Marland KitchenTPN (CLINIMIX-E) Adult 41 mL/hr at 12/27/15 0600  . fentaNYL 10 mcg/ml infusion 175 mcg/hr (12/27/15 0600)  . norepinephrine (LEVOPHED) Adult infusion Stopped (12/26/15 2151)    Insulin Requirements in the past 24 hours:    Current Nutrition:  Clinimix E 5/15 at 40 ml/hr  ADD: Spoke with Ebony Hail, RD and patient will be transition to goal rate of 83 ml/min of Clinimix. RD concerned about glucose ranges over past 24 hours. Patient has been switched to moderate SSI   Assessment: 70 y/o M with multiple medical problems including bowel perforation to begin TPN therapy.   Plan:  Patient is on  SSI which will be continued while patient is on TPN. Will start Lantus 10 units qhs.  Electrolytes are WNL today. Will f/u AM labs.   Pharmacy will continue to monitor and adust per consult.   Davison Ohms D, Pharm.D., BCPS Clinical Pharmacist 12/27/2015,9:29 AM

## 2015-12-27 NOTE — Plan of Care (Signed)
Problem: Safety: Goal: Ability to remain free from injury will improve Outcome: Progressing Pt. Is on bedrest  Problem: Health Behavior/Discharge Planning: Goal: Ability to manage health-related needs will improve Outcome: Not Progressing Pt. Intubated w/ foley and TPN  Problem: Pain Managment: Goal: General experience of comfort will improve Outcome: Progressing Fentanyl gtt running, versed PRN pushes given Q2-3h to maintain comfort level  Problem: Physical Regulation: Goal: Ability to maintain clinical measurements within normal limits will improve Outcome: Not Progressing Low grade fever, HR 90's - 110's, SBP: 120's - 140's, RR 20's-30's, O2SATs > 95% Goal: Will remain free from infection Outcome: Not Progressing WBC: 18.5  Problem: Skin Integrity: Goal: Risk for impaired skin integrity will decrease Outcome: Not Progressing Pt. Immobile, TPN  Problem: Tissue Perfusion: Goal: Risk factors for ineffective tissue perfusion will decrease Outcome: Not Progressing Pt. Immobile  Problem: Activity: Goal: Risk for activity intolerance will decrease Outcome: Not Progressing Pt. immobile  Problem: Nutrition: Goal: Adequate nutrition will be maintained Outcome: Progressing TPN started 3/3  Problem: Consults Goal: Diabetes Guidelines if Diabetic/Glucose > 140 If diabetic or lab glucose is > 140 mg/dl - Initiate Diabetes/Hyperglycemia Guidelines & Document Interventions  Outcome: Not Progressing CBG has been rising since TPN administration, sliding scale changed from sensitive to moderate  Problem: Phase I Progression Outcomes Goal: Code status addressed with pt/family Outcome: Progressing Family adamant about Full code status (per multiple MD notes) Goal: Patient tolerating weaning plan Outcome: Not Progressing Vent on PRVC Goal: Tracheostomy by Vent Day 14 Outcome: Progressing Pt. Planned for trach placement Monday-Tuesday next week Goal: Optimized method of  communication. Outcome: Not Progressing Pt. Non-communicative Goal: Initial discharge plan identified Outcome: Progressing LTAC placement being considered per Ms. Greene's note (case management) Goal: Voiding-avoid urinary catheter unless indicated Outcome: Progressing Pt. Voiding w/ urinal placed by urology, not to be taken out while in ICU (per Dr. Cherrie Gauze note)- 2/15

## 2015-12-28 ENCOUNTER — Inpatient Hospital Stay
Admit: 2015-12-28 | Discharge: 2015-12-28 | Disposition: A | Discharge status: Home / Self Care | Payer: PPO | Attending: Vascular Surgery | Admitting: Vascular Surgery

## 2015-12-28 ENCOUNTER — Inpatient Hospital Stay: Payer: PPO

## 2015-12-28 DIAGNOSIS — I2699 Other pulmonary embolism without acute cor pulmonale: Secondary | ICD-10-CM

## 2015-12-28 LAB — BLOOD GAS, ARTERIAL
Acid-Base Excess: 4.7 mmol/L — ABNORMAL HIGH (ref 0.0–3.0)
Bicarbonate: 29.8 mEq/L — ABNORMAL HIGH (ref 21.0–28.0)
FIO2: 0.6
Mechanical Rate: 15
O2 SAT: 99.6 %
PATIENT TEMPERATURE: 37
PCO2 ART: 47 mmHg (ref 32.0–48.0)
PEEP: 5 cmH2O
PO2 ART: 175 mmHg — AB (ref 83.0–108.0)
VT: 500 mL
pH, Arterial: 7.41 (ref 7.350–7.450)

## 2015-12-28 LAB — CBC
HCT: 24 % — ABNORMAL LOW (ref 40.0–52.0)
HCT: 29.5 % — ABNORMAL LOW (ref 40.0–52.0)
HEMATOCRIT: 25.5 % — AB (ref 40.0–52.0)
Hemoglobin: 7.3 g/dL — ABNORMAL LOW (ref 13.0–18.0)
Hemoglobin: 7.3 g/dL — ABNORMAL LOW (ref 13.0–18.0)
Hemoglobin: 9.2 g/dL — ABNORMAL LOW (ref 13.0–18.0)
MCH: 27.5 pg (ref 26.0–34.0)
MCH: 27.7 pg (ref 26.0–34.0)
MCH: 28.1 pg (ref 26.0–34.0)
MCHC: 28.8 g/dL — ABNORMAL LOW (ref 32.0–36.0)
MCHC: 30.3 g/dL — ABNORMAL LOW (ref 32.0–36.0)
MCHC: 31.1 g/dL — AB (ref 32.0–36.0)
MCV: 90.5 fL (ref 80.0–100.0)
MCV: 90.7 fL (ref 80.0–100.0)
MCV: 96.2 fL (ref 80.0–100.0)
PLATELETS: 392 10*3/uL (ref 150–440)
PLATELETS: 409 10*3/uL (ref 150–440)
Platelets: 428 10*3/uL (ref 150–440)
RBC: 2.65 MIL/uL — AB (ref 4.40–5.90)
RBC: 2.65 MIL/uL — ABNORMAL LOW (ref 4.40–5.90)
RBC: 3.27 MIL/uL — ABNORMAL LOW (ref 4.40–5.90)
RDW: 19.6 % — ABNORMAL HIGH (ref 11.5–14.5)
RDW: 18.2 % — AB (ref 11.5–14.5)
RDW: 17.8 % — AB (ref 11.5–14.5)
WBC: 16.6 10*3/uL — AB (ref 3.8–10.6)
WBC: 16.7 10*3/uL — AB (ref 3.8–10.6)
WBC: 17.8 10*3/uL — AB (ref 3.8–10.6)

## 2015-12-28 LAB — POTASSIUM: Potassium: 3.3 mmol/L — ABNORMAL LOW (ref 3.5–5.1)

## 2015-12-28 LAB — COMPREHENSIVE METABOLIC PANEL
ALK PHOS: 96 U/L (ref 38–126)
ALT: 29 U/L (ref 17–63)
AST: 45 U/L — AB (ref 15–41)
Albumin: 1.6 g/dL — ABNORMAL LOW (ref 3.5–5.0)
Anion gap: 12 (ref 5–15)
BILIRUBIN TOTAL: 0.7 mg/dL (ref 0.3–1.2)
BUN: 29 mg/dL — AB (ref 6–20)
CALCIUM: 7.4 mg/dL — AB (ref 8.9–10.3)
CO2: 24 mmol/L (ref 22–32)
CREATININE: 1.26 mg/dL — AB (ref 0.61–1.24)
Chloride: 109 mmol/L (ref 101–111)
GFR calc Af Amer: >60 mL/min (ref >60)
GFR, EST NON AFRICAN AMERICAN: 56 mL/min — AB (ref >60)
Glucose, Bld: 600 mg/dL (ref 65–99)
Potassium: 3.2 mmol/L — ABNORMAL LOW (ref 3.5–5.1)
Sodium: 145 mmol/L (ref 135–145)
TOTAL PROTEIN: 5.7 g/dL — AB (ref 6.5–8.1)

## 2015-12-28 LAB — PHOSPHORUS: Phosphorus: 3.7 mg/dL (ref 2.5–4.6)

## 2015-12-28 LAB — BASIC METABOLIC PANEL
Anion gap: 5 (ref 5–15)
BUN: 34 mg/dL — AB (ref 6–20)
CHLORIDE: 114 mmol/L — AB (ref 101–111)
CO2: 30 mmol/L (ref 22–32)
CREATININE: 1.22 mg/dL (ref 0.61–1.24)
Calcium: 7.8 mg/dL — ABNORMAL LOW (ref 8.9–10.3)
GFR calc Af Amer: >60 mL/min (ref >60)
GFR calc non Af Amer: 58 mL/min — ABNORMAL LOW (ref >60)
GLUCOSE: 416 mg/dL — AB (ref 65–99)
POTASSIUM: 3.5 mmol/L (ref 3.5–5.1)
Sodium: 149 mmol/L — ABNORMAL HIGH (ref 135–145)

## 2015-12-28 LAB — TROPONIN I: Troponin I: 0.27 ng/mL — ABNORMAL HIGH (ref <0.031)

## 2015-12-28 LAB — VANCOMYCIN, TROUGH: Vancomycin Tr: 12 ug/mL (ref 10–20)

## 2015-12-28 LAB — APTT: APTT: 38 s — AB (ref 24–36)

## 2015-12-28 LAB — GLUCOSE, CAPILLARY
GLUCOSE-CAPILLARY: 311 mg/dL — AB (ref 65–99)
GLUCOSE-CAPILLARY: 208 mg/dL — AB (ref 65–99)
Glucose-Capillary: 245 mg/dL — ABNORMAL HIGH (ref 65–99)
Glucose-Capillary: 309 mg/dL — ABNORMAL HIGH (ref 65–99)
Glucose-Capillary: 341 mg/dL — ABNORMAL HIGH (ref 65–99)

## 2015-12-28 LAB — LACTIC ACID, PLASMA
Lactic Acid, Venous: 6 mmol/L (ref 0.5–2.0)
Lactic Acid, Venous: 2.2 mmol/L (ref 0.5–2.0)

## 2015-12-28 LAB — PREPARE RBC (CROSSMATCH)

## 2015-12-28 LAB — FIBRIN DERIVATIVES D-DIMER (ARMC ONLY)

## 2015-12-28 LAB — BRAIN NATRIURETIC PEPTIDE

## 2015-12-28 LAB — PROTIME-INR
INR: 1.35
PROTHROMBIN TIME: 16.8 s — AB (ref 11.4–15.0)

## 2015-12-28 LAB — HEPARIN LEVEL (UNFRACTIONATED): Heparin Unfractionated: 0.15 IU/mL — ABNORMAL LOW (ref 0.30–0.70)

## 2015-12-28 LAB — MAGNESIUM: Magnesium: 2.3 mg/dL (ref 1.7–2.4)

## 2015-12-28 MED ORDER — POTASSIUM PHOSPHATES 15 MMOLE/5ML IV SOLN: 40.0000 meq | Freq: Once | INTRAVENOUS | Status: DC

## 2015-12-28 MED ORDER — INSULIN ASPART 100 UNIT/ML ~~LOC~~ SOLN
0.0000 [IU] | SUBCUTANEOUS | Status: DC
  Administered 2015-12-28: 15 [IU] via SUBCUTANEOUS
  Administered 2015-12-28: 7 [IU] via SUBCUTANEOUS
  Administered 2015-12-28: 15 [IU] via SUBCUTANEOUS
  Administered 2015-12-28: 7 [IU] via SUBCUTANEOUS
  Administered 2015-12-28: 15 [IU] via SUBCUTANEOUS
  Administered 2015-12-28: 4 [IU] via SUBCUTANEOUS
  Administered 2015-12-29: 15 [IU] via SUBCUTANEOUS
  Administered 2015-12-29: 7 [IU] via SUBCUTANEOUS
  Filled 2015-12-28 (×2): qty 7
  Filled 2015-12-28: qty 15
  Filled 2015-12-28: qty 4
  Filled 2015-12-28: qty 7
  Filled 2015-12-28 (×3): qty 15

## 2015-12-28 MED ORDER — SODIUM CHLORIDE 0.9 % IV SOLN
Freq: Once | INTRAVENOUS | Status: AC
Start: 2015-12-28 — End: 2015-12-28
  Administered 2015-12-28: 13:00:00 via INTRAVENOUS

## 2015-12-28 MED ORDER — AMIODARONE HCL IN DEXTROSE 360-4.14 MG/200ML-% IV SOLN
60.0000 mg/h | INTRAVENOUS | Status: AC
  Administered 2015-12-28 (×2): 60 mg/h via INTRAVENOUS
  Filled 2015-12-28 (×2): qty 200

## 2015-12-28 MED ORDER — AMIODARONE HCL IN DEXTROSE 360-4.14 MG/200ML-% IV SOLN
30.0000 mg/h | INTRAVENOUS | Status: DC
  Administered 2015-12-28 – 2016-01-03 (×9): 30 mg/h via INTRAVENOUS
  Filled 2015-12-28 (×21): qty 200

## 2015-12-28 MED ORDER — VANCOMYCIN HCL IN DEXTROSE 1-5 GM/200ML-% IV SOLN
1000.0000 mg | INTRAVENOUS | Status: DC
  Administered 2015-12-29 – 2015-12-31 (×4): 1000 mg via INTRAVENOUS
  Filled 2015-12-28 (×5): qty 200

## 2015-12-28 MED ORDER — INSULIN GLARGINE 100 UNIT/ML ~~LOC~~ SOLN
20.0000 [IU] | Freq: Every day | SUBCUTANEOUS | Status: DC
  Administered 2015-12-28: 20 [IU] via SUBCUTANEOUS
  Filled 2015-12-28 (×2): qty 0.2

## 2015-12-28 MED ORDER — TRACE MINERALS CR-CU-MN-SE-ZN 10-1000-500-60 MCG/ML IV SOLN
INTRAVENOUS | Status: AC
  Administered 2015-12-28: 17:00:00 via INTRAVENOUS
  Filled 2015-12-28: qty 1992

## 2015-12-28 MED ORDER — HEPARIN (PORCINE) IN NACL 100-0.45 UNIT/ML-% IJ SOLN
1350.0000 [IU]/h | INTRAMUSCULAR | Status: DC
  Administered 2015-12-28: 850 [IU]/h via INTRAVENOUS
  Administered 2015-12-28: 700 [IU]/h via INTRAVENOUS
  Filled 2015-12-28 (×4): qty 250

## 2015-12-28 MED ORDER — HEPARIN BOLUS VIA INFUSION
1800.0000 [IU] | Freq: Once | INTRAVENOUS | Status: AC
  Administered 2015-12-28: 1800 [IU] via INTRAVENOUS
  Filled 2015-12-28: qty 1800

## 2015-12-28 MED ORDER — POTASSIUM CHLORIDE 10 MEQ/50ML IV SOLN
10.0000 meq | INTRAVENOUS | Status: AC
  Administered 2015-12-28 – 2015-12-29 (×5): 10 meq via INTRAVENOUS
  Filled 2015-12-28 (×5): qty 50

## 2015-12-28 MED ORDER — SODIUM CHLORIDE 0.9 % IV SOLN: 1.0000 g | Freq: Once | INTRAVENOUS | Status: DC

## 2015-12-28 NOTE — Progress Notes (Signed)
Son [place] called to come to the hospital. Left message on machine with Avion Kutzer to call the hospital.

## 2015-12-28 NOTE — Progress Notes (Signed)
Pharmacy Anti-infective Note  Stephen Camacho is a 70 y.o. male admitted on 12/05/2015 with pneumonia.  Pharmacy has been consulted for vancomycin, Zosyn, and anidulafungin dosing.  Plan: Trough level resulted @ 12 mcg/ml. Will increase Vancomycin dose to 1 g IV q18 hours.   Zosyn: Continue Zosyn 3.375 g EI q 8 hours.   Anidulafungin '100mg'$  IV Q24hr.    Pharmacy will continue to monitor and adjust per consult.   Height: '6\' 4"'$  (193 cm) Weight: 134 lb 7.7 oz (61 kg) IBW/kg (Calculated) : 86.8  Temp (24hrs), Avg:98.5 F (36.9 C), Min:97.5 F (36.4 C), Max:99.3 F (37.4 C)   Recent Labs Lab 12/23/15 0533  12/24/15 1032 12/26/15 0446 12/26/15 1109 12/27/15 0419 12/28/15 0012 12/28/15 0300 12/28/15 0519 12/28/15 1130  WBC 18.5*  --  18.5*  --   --  18.5* 16.7*  --  17.8*  --   CREATININE 1.19  --  1.23  --  1.34* 1.24 1.26*  --  1.22  --   LATICACIDVEN  --   --   --   --   --   --  6.0* 2.2*  --   --   VANCOTROUGH  --   < >  --  22*  --   --   --   --   --  12  < > = values in this interval not displayed.  Estimated Creatinine Clearance: 48.6 mL/min (by C-G formula based on Cr of 1.22).    No Known Allergies  Antimicrobials this admission: vancomycin 02/22 >> 02/23 ceftazidime 02/22 >> 02/24 Metronidazole 02/23 >> 02/24 Zosyn 02/24 >> Vancomycin 02/26 >> Anidulafungin 02/27>>  Dose adjustments this admission:   Microbiology results: 02/23 BCx: negative x 2 02/23 UCx: negative  02/21 Sputum: Candida albicans  02/22 MRSA PCR: negative 02/23 TA Candida albicans 02/26 TA moderate growth Candida albicans  Thank you for allowing pharmacy to be a part of this patient's care.  Stephen Camacho D 12/28/2015 11:53 AM

## 2015-12-28 NOTE — Progress Notes (Signed)
ANTICOAGULATION CONSULT NOTE - Initial Consult  Pharmacy Consult for Heparin Indication: chest pain/ACS  No Known Allergies  Patient Measurements: Height: '6\' 4"'$  (193 cm) Weight: 134 lb 7.7 oz (61 kg) IBW/kg (Calculated) : 86.8 Heparin Dosing Weight: 61 kg  Vital Signs: Temp: 99.9 F (37.7 C) (03/05 2000) Temp Source: Rectal (03/05 1900) BP: 130/74 mmHg (03/05 2200) Pulse Rate: 102 (03/05 1630)  Labs:  Recent Labs  12/27/15 0419 12/28/15 0012 12/28/15 0519 12/28/15 1156 12/28/15 1732 12/28/15 2219  HGB 7.9* 7.3* 7.3*  --  9.2*  --   HCT 25.6* 25.5* 24.0*  --  29.5*  --   PLT 486* 428 392  --  409  --   APTT  --   --   --  38*  --   --   LABPROT  --   --   --  16.8*  --   --   INR  --   --   --  1.35  --   --   HEPARINUNFRC  --   --   --   --   --  0.15*  CREATININE 1.24 1.26* 1.22  --   --   --   TROPONINI  --  0.27*  --   --   --   --     Estimated Creatinine Clearance: 48.6 mL/min (by C-G formula based on Cr of 1.22).   Assessment: 70 yo male with hx of CAD s/p cardiac arrest to start on heparin drip Hgb 7.3 ordered to receive blood today, Plt 392 - per CCU RN will check Hgb one hour after blood is transfused aptt and INR ordered --> INR 1.35, aPTT 38 Pt received enoxaparin 40 mg at 1124  Goal of Therapy:  Heparin level 0.3-0.7 units/ml Monitor platelets by anticoagulation protocol: Yes   Plan:  Heparin level subtherapeutic. 1800 units IV x 1 bolus and increase rate to 850 units/hr. Will recheck heparin level in 8 hours.  Laural Benes, Pharm.D., BCPS Clinical Pharmacist 12/28/2015,11:10 PM

## 2015-12-28 NOTE — Progress Notes (Signed)
Nutrition Follow-up   INTERVENTION:   PN: Continue current PN regimen as ordered. Pharmacy consulted for electrolytes and glucose management. RD notes pt has been on insulin drip previously during this admission. Pharmacy making adjustments to insulin regimen. Will continue to follow and assess.   NUTRITION DIAGNOSIS:   Inadequate oral intake related to inability to eat as evidenced by NPO status. Being addressed with TPN  GOAL:   Patient will meet greater than or equal to 90% of their needs; ongoing  MONITOR:    (Energy Intake, Electrolyte and renal Profile, Anthropometrics, Digestive System, Glucose Profile, Pulmonary Profile)  REASON FOR ASSESSMENT:   Consult New TPN/TNA  ASSESSMENT:   Pt coded twice last night per chart review. Pt remains intubated on the vent, possibly scheduled for trach tomorrow per MD notes.  Per Surgical MD note,OK starting EN via NG tube s/p trach placement.   Diet Order:  Diet NPO time specified .TPN (CLINIMIX-E) Adult .TPN (CLINIMIX-E) Adult    PN: 5%AA/15%Dextrose at 26m/hr    Gastrointestinal Profile: soft abdomen, hypoactive BS, nontender per Nsg documentation Last BM: 12/26/2015 smear brown stool   Scheduled Medications:  . sodium chloride   Intravenous Once  . anidulafungin  100 mg Intravenous Q24H  . antiseptic oral rinse  7 mL Mouth Rinse 10 times per day  . chlorhexidine gluconate  15 mL Mouth Rinse BID  . insulin aspart  0-20 Units Subcutaneous 6 times per day  . insulin glargine  20 Units Subcutaneous Daily  . pantoprazole (PROTONIX) IV  40 mg Intravenous Q24H  . piperacillin-tazobactam (ZOSYN)  IV  3.375 g Intravenous 3 times per day  . [START ON 12/29/2015] vancomycin  1,000 mg Intravenous Q18H    Continuous Medications:  . .Marland KitchenPN (CLINIMIX-E) Adult 83 mL/hr at 12/28/15 0600  . .Marland KitchenPN (CLINIMIX-E) Adult    . amiodarone 30 mg/hr (12/28/15 0649)  . fentaNYL 10 mcg/ml infusion 250 mcg/hr (12/28/15 0850)  . heparin    .  norepinephrine (LEVOPHED) Adult infusion Stopped (12/26/15 2151)     Electrolyte/Renal Profile and Glucose Profile:   Recent Labs Lab 12/26/15 1109 12/27/15 0419 12/28/15 0012 12/28/15 0519  NA 147* 148* 145 149*  K 3.8 3.6 3.2* 3.5  CL 112* 113* 109 114*  CO2 '25 28 24 30  '$ BUN 33* 32* 29* 34*  CREATININE 1.34* 1.24 1.26* 1.22  CALCIUM 8.1* 7.9* 7.4* 7.8*  MG 1.9 2.0  --  2.3  PHOS 3.5 3.2  --  3.7  GLUCOSE 229* 326* 600* 416*   Protein Profile:  Recent Labs Lab 12/26/15 1109 12/27/15 0419 12/28/15 0012  ALBUMIN 1.8* 1.9* 1.6*     Weight Trend since Admission: Filed Weights   12/26/15 0500 12/27/15 0432 12/28/15 0500  Weight: 130 lb 1.1 oz (59 kg) 128 lb 1.4 oz (58.1 kg) 134 lb 7.7 oz (61 kg)    Skin:   (stage II pressure ulcer on sacrum and lip)    BMI:  Body mass index is 16.38 kg/(m^2).  Estimated Nutritional Needs:   Kcal:  1371 kcals (Ve: 8.7, Tmax: 38.4) using wt of 59 kg, length of 44 inches  Protein:  89-118 g (1.5-2.0 g/kg)   Fluid:  1770-2065 mL (30-35 ml/kg)   EDUCATION NEEDS:   No education needs identified at this time  HElwood RD, LDN Pager ([phone]/On-Call Pager [phone]

## 2015-12-28 NOTE — Progress Notes (Signed)
Dr. Melynda Ripple spoke with family : son and daughter bedside, pt.'s family adamant about pt. Being full code.  Family discussed miracles happening and continuing aggressive tx.

## 2015-12-28 NOTE — Progress Notes (Signed)
Responded to code on this patient. Took off vent and bagged via ambu bag. Bilateral breath sounds verified by Dr Dahlia Client. Breath sounds coarse post code. Lavaged and suctioned patient post code for moderate amount of tan secretions. Patient tolerated well.

## 2015-12-28 NOTE — Progress Notes (Signed)
2345: Pt. desynchronous w/ the vent, had 3 beat run of V-tach - gave versed PRN push (see EMR for details). Pt. Became apneic and HR began trending down into 40's then 30's in what looked like a junctional rhythm.  - Called Code Blue, Demetria-RT and charge nurse first responders.  RT bagged pt. Via ambu bag.   Dr. Dahlia Client and Dr. Melynda Ripple bedside with code cart.   Unable to verify pulse w/ junctional rhythm on monitor, pushed atropine. 2351: asystole on monitor, Started CPR  - felicia RN called son and family.  Achieved ROSC, then lost pulse again, CPR/epi/defib x1.  Achieved ROSC 1203.  - Dr. Melynda Ripple spoke with son on phone, ordered labwork, [place], started amio gtt.

## 2015-12-28 NOTE — ED Provider Notes (Signed)
[facility] Department of Emergency Medicine   Code Blue CONSULT NOTE  Chief Complaint: Cardiac arrest/unresponsive   Level V Caveat: Unresponsive  History of present illness: I was contacted by the hospital for a CODE BLUE cardiac arrest upstairs and presented to the patient's bedside.  I arrived upstairs to find the patient bradycardic. According to the nurse the patient had been agitated on his event and she was giving him 4 mg of Versed. She reports that as she was pushing the Versed she noticed the patient was bradycardic and he did have a 3 beat run of V. tach. When I arrived up into the patient's room the patient's rhythm disappeared and he went into asystole. The patient did not have a pulse at that time. Patient was already intubated with a central line when I arrived into the room.  ROS: Unable to obtain, Level V caveat  Scheduled Meds: . sodium chloride   Intravenous Once  . anidulafungin  100 mg Intravenous Q24H  . antiseptic oral rinse  7 mL Mouth Rinse 10 times per day  . chlorhexidine gluconate  15 mL Mouth Rinse BID  . enoxaparin (LOVENOX) injection  40 mg Subcutaneous Q24H  . insulin aspart  0-20 Units Subcutaneous 6 times per day  . insulin glargine  20 Units Subcutaneous Daily  . pantoprazole (PROTONIX) IV  40 mg Intravenous Q24H  . piperacillin-tazobactam (ZOSYN)  IV  3.375 g Intravenous 3 times per day  . vancomycin  750 mg Intravenous Q18H   Continuous Infusions: . Marland KitchenTPN (CLINIMIX-E) Adult 83 mL/hr at 12/28/15 0600  . amiodarone 30 mg/hr (12/28/15 0649)  . fentaNYL 10 mcg/ml infusion 250 mcg/hr (12/28/15 0850)  . norepinephrine (LEVOPHED) Adult infusion Stopped (12/26/15 2151)   PRN Meds:.acetaminophen **OR** acetaminophen, bisacodyl, ibuprofen, ipratropium-albuterol, midazolam, ondansetron **OR** ondansetron (ZOFRAN) IV, polyethylene glycol, sennosides Past Medical History  Diagnosis Date  . Hyperlipidemia   . Hypertension   . Diabetes  mellitus ([place])   . CAD (coronary artery disease)     a. s/p 2 v CABG in 2012 (LIMA-LAD & SVG-OM); b. cath 07/2012 s/p PCI/DES to ostial LCx and OM  . PAD (peripheral artery disease) ([place])     a. s/p right SFA stent; b. s/p right toe amputation 2011; c. s/p right AKA spring 2016  . Seizures ([place])   . Gangrene of foot ([place])   . Wheezing   . Lung cancer ([place])   . Prostate cancer ([place])   . Stroke [facility])    Past Surgical History  Procedure Laterality Date  . Right aka  07-10-2014  . Left femoral popliteal bypass    . Right lung lobeectomy    . Coronary artery bypass graft    . Peripheral vascular catheterization Left 12/06/2015    Procedure: Lower Extremity Angiography;  Surgeon: [name], MD;  Location: [place] CV LAB;  Service: Cardiovascular;  Laterality: Left;  . Peripheral vascular catheterization Left 12/06/2015    Procedure: Lower Extremity Intervention;  Surgeon: [name], MD;  Location: [place] [facility] LAB;  Service: Cardiovascular;  Laterality: Left;  . Peripheral vascular catheterization N/A 12/05/2015    Procedure: Lower Extremity Angiography;  Surgeon: [name], MD;  Location: [place] [facility] LAB;  Service: Cardiovascular;  Laterality: N/A;  . Peripheral vascular catheterization  12/05/2015    Procedure: Lower Extremity Intervention;  Surgeon: [name], MD;  Location: [place] CV LAB;  Service: Cardiovascular;;  . Amputation Left 12/12/2015    Procedure:  AMPUTATION ABOVE KNEE;  Surgeon: [name], MD;  Location: [facility] ORS;  Service: Vascular;  Laterality: Left;  . Peg placement N/A 12/24/2015    Procedure: PERCUTANEOUS ENDOSCOPIC GASTROSTOMY (PEG) PLACEMENT;  Surgeon: Josefine Class, MD;  Location: [facility] ENDOSCOPY;  Service: Endoscopy;  Laterality: N/A;   Social History   Social History  . Marital Status: Widowed    Spouse Name: N/A  . Number of Children: N/A  . Years of Education: N/A   Occupational History  . Not on file.    Social History Main Topics  . Smoking status: Never Smoker   . Smokeless tobacco: Not on file  . Alcohol Use: No  . Drug Use: Not on file  . Sexual Activity: Not on file   Other Topics Concern  . Not on file   Social History Narrative   No Known Allergies  Last set of Vital Signs (not current) Filed Vitals:   12/28/15 0500 12/28/15 0600  BP: 105/60 101/61  Pulse:    Temp: 99.1 F (37.3 C) 99 F (37.2 C)  Resp: 15 21      Physical Exam  Gen: unresponsive Cardiovascular: pulseless  Resp: apneic. Breath sounds equal bilaterally with bagging  Abd: nondistended  Neuro: GCS 3, unresponsive to pain  HEENT: No blood in posterior pharynx, gag reflex absent  Neck: No crepitus  Musculoskeletal: No deformity  Skin: warm  Procedures    CRITICAL CARE Performed by: Charlesetta Ivory P Total critical care time: 30 minutes Critical care time was exclusive of separately billable procedures and treating other patients. Critical care was necessary to treat or prevent imminent or life-threatening deterioration. Critical care was time spent personally by me on the following activities: development of treatment plan with patient and/or surrogate as well as nursing, discussions with consultants, evaluation of patient's response to treatment, examination of patient, obtaining history from patient or surrogate, ordering and performing treatments and interventions, ordering and review of laboratory studies, ordering and review of radiographic studies, pulse oximetry and re-evaluation of patient's condition.  Cardiopulmonary Resuscitation (CPR) Procedure Note  Directed/Performed by: Loney Hering I personally directed ancillary staff and/or performed CPR in an effort to regain return of spontaneous circulation and to maintain cardiac, neuro and systemic perfusion.    Medical Decision making  When the patient went into asystole he was given 1 mg of atropine and a milligram of  epinephrine. The patient had chest compressions done and after multiple minutes received another dose of epinephrine and some sodium bicarbonate. The patient did regain pulses and was tachycardic into the 170s and 180s. The patient then went into ventricular tachycardia that appeared to have some torsades rhythm to it. The patient received 1 shock and had return of pulses. He did receive a dose of magnesium sulfate as well and he received some amiodarone 150 mg after having more runs of ventricular tachycardia. After listening to the patient's breath sounds he did have some mildly decreased breath sounds on the left so we did pull back endotracheal tube. The patient will also be placed on the amiodarone drip. We contacted the intensivist on call to take over the care of the patient alongside the hospitalist who arrived at the bedside. The patient did have pulses and return of spontaneous circulation at the time I left his room.  Assessment and Plan  The patient had return of spontaneous circulation after some CPR. The specific times can be noted on the CPR sheet. The patient's care was  turned over to the hospitalist as well as the intensivist.    Loney Hering, MD 12/28/15 [phone]

## 2015-12-28 NOTE — Progress Notes (Signed)
PARENTERAL NUTRITION CONSULT NOTE - FOLLOW UP   Pharmacy Consult for TPN Electrolyte and Glucose Monitoirng  Indication: Bowel Perforation  No Known Allergies  Patient Measurements: Height: '6\' 4"'$  (193 cm) Weight: 134 lb 7.7 oz (61 kg) IBW/kg (Calculated) : 86.8   Vital Signs: Temp: 100 F (37.8 C) (03/05 1900) Temp Source: Rectal (03/05 1630) BP: 143/78 mmHg (03/05 1900) Pulse Rate: 102 (03/05 1630) Intake/Output from previous day: 03/04 0701 - 03/05 0700 In: 2408.8 [I.V.:615.2; IV Piggyback:300; MPN:3614.4] Out: 3154 [Urine:1750] Intake/Output from this shift:    Labs:  Recent Labs  12/28/15 0012 12/28/15 0519 12/28/15 1156 12/28/15 1732  WBC 16.7* 17.8*  --  16.6*  HGB 7.3* 7.3*  --  9.2*  HCT 25.5* 24.0*  --  29.5*  PLT 428 392  --  409  APTT  --   --  38*  --   INR  --   --  1.35  --      Recent Labs  12/26/15 1109 12/27/15 0419 12/28/15 0012 12/28/15 0519 12/28/15 1729  NA 147* 148* 145 149*  --   K 3.8 3.6 3.2* 3.5 3.3*  CL 112* 113* 109 114*  --   CO2 '25 28 24 30  '$ --   GLUCOSE 229* 326* 600* 416*  --   BUN 33* 32* 29* 34*  --   CREATININE 1.34* 1.24 1.26* 1.22  --   CALCIUM 8.1* 7.9* 7.4* 7.8*  --   MG 1.9 2.0  --  2.3  --   PHOS 3.5 3.2  --  3.7  --   PROT 6.5 6.4* 5.7*  --   --   ALBUMIN 1.8* 1.9* 1.6*  --   --   AST 20 20 45*  --   --   ALT '31 26 29  '$ --   --   ALKPHOS 101 111 96  --   --   BILITOT 1.1 0.8 0.7  --   --   TRIG  --  139  --   --   --    Estimated Creatinine Clearance: 48.6 mL/min (by C-G formula based on Cr of 1.22).    Recent Labs  12/28/15 0723 12/28/15 1121 12/28/15 1552  GLUCAP 309* 311* 208*    Medical History: Past Medical History  Diagnosis Date  . Hyperlipidemia   . Hypertension   . Diabetes mellitus ([place])   . CAD (coronary artery disease)     a. s/p 2 v CABG in 2012 (LIMA-LAD & SVG-OM); b. cath 07/2012 s/p PCI/DES to ostial LCx and OM  . PAD (peripheral artery disease) ([place])     a. s/p right SFA  stent; b. s/p right toe amputation 2011; c. s/p right AKA spring 2016  . Seizures ([place])   . Gangrene of foot ([place])   . Wheezing   . Lung cancer ([place])   . Prostate cancer ([place])   . Stroke [facility])     Medications:  Scheduled:  . anidulafungin  100 mg Intravenous Q24H  . antiseptic oral rinse  7 mL Mouth Rinse 10 times per day  . chlorhexidine gluconate  15 mL Mouth Rinse BID  . insulin aspart  0-20 Units Subcutaneous 6 times per day  . insulin glargine  20 Units Subcutaneous Daily  . pantoprazole (PROTONIX) IV  40 mg Intravenous Q24H  . piperacillin-tazobactam (ZOSYN)  IV  3.375 g Intravenous 3 times per day  . potassium chloride  10 mEq Intravenous Q1 Hr x 5  . [  START ON 12/29/2015] vancomycin  1,000 mg Intravenous Q18H   Infusions:  . Marland KitchenTPN (CLINIMIX-E) Adult 83 mL/hr at 12/28/15 1725  . amiodarone 30 mg/hr (12/28/15 1546)  . fentaNYL 10 mcg/ml infusion 250 mcg/hr (12/28/15 1705)  . heparin 700 Units/hr (12/28/15 1706)  . norepinephrine (LEVOPHED) Adult infusion Stopped (12/26/15 2151)    Insulin Requirements in the past 24 hours:    Current Nutrition:  Clinimix E 5/15 at 83 ml/min   Assessment: 70 y/o M with multiple medical problems including bowel perforation.  SSI requirement over past 24 hours: 52 units   Plan:  Patient is on SSI which will be continued while patient is on TPN. Will increase to  Lantus 20 units qhs.  MD ordered Ca chloride 1g and KPhos 40 mEq x1  Electrolytes are WNL today; however surgery would like Potassium closer to 4.Will order additional potassium 62mq IV x 5 doses.Will recheck electrolytes with am labs.     Pharmacy will continue to monitor and adust per consult.   Jakirah Zaun L,  12/28/2015,7:17 PM

## 2015-12-28 NOTE — Progress Notes (Signed)
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 Now with acute respiratory failure, intubated due to pneumonia. Underwent a left AKA   SUBJECTIVE:  Episode of vent dyssynchrony, 3 beat run of VT and bradycardia with HR in the 30s and sequent asystole arrest last night. Patient was given one dose of atropine for HR in the 30s and then went into a witnessed asystole arrest. CPR was initiated and sustained for a total of 12 minutes with ROSC. He was given epi pushes and defibrillated x 1.  Trach planned for Monday 12/29/15.  Zosyn/Vanc/antfungal therapy for complication from PEG tube placement s/p bowel/stomach perforation and candida in sputum   VITAL SIGNS: BP 132/77 mmHg  Pulse 98  Temp(Src) 99.3 F (37.4 C) (Rectal)  Resp 17  Ht '6\' 4"'$  (1.93 m)  Wt 134 lb 7.7 oz (61 kg)  BMI 16.38 kg/m2  SpO2 99%  HEMODYNAMICS:    VENTILATOR SETTINGS: Vent Mode:  [-] PRVC FiO2 (%):  [30 %-60 %] 30 % Set Rate:  [15 bmp] 15 bmp Vt Set:  [500 mL] 500 mL PEEP:  [5 cmH20] 5 cmH20 Plateau Pressure:  [21 cmH20] 21 cmH20  INTAKE / OUTPUT: I/O last 3 completed shifts: In: 3279.1 [I.V.:784.5; IV Piggyback:550] Out: 2485 [Urine:2485]  PHYSICAL EXAMINATION: General: Chronically ill appearing Neuro:Awake on the vent, not tracking and not following commands HEENT: NCAT, ETT in place Cardiovascular: RRR, S1/S2, no MRG Lungs: Bilateral airflow, no wheezes Abdomen: Soft, NT, +BS Ext: Bilateral AKA, Left stump clean and dry  LABS:  BMET  Recent Labs Lab 12/27/15 0419 12/28/15 0012 12/28/15 0519   NA 148* 145 149*  K 3.6 3.2* 3.5  CL 113* 109 114*  CO2 '28 24 30  '$ BUN 32* 29* 34*  CREATININE 1.24 1.26* 1.22  GLUCOSE 326* 600* 416*    Electrolytes  Recent Labs Lab 12/26/15 1109 12/27/15 0419 12/28/15 0012 12/28/15 0519  CALCIUM 8.1* 7.9* 7.4* 7.8*  MG 1.9 2.0  --  2.3  PHOS 3.5 3.2  --  3.7    CBC  Recent Labs Lab 12/27/15 0419 12/28/15 0012 12/28/15 0519  WBC 18.5* 16.7* 17.8*  HGB 7.9* 7.3* 7.3*  HCT 25.6* 25.5* 24.0*  PLT 486* 428 392    Coag's  Recent Labs Lab 12/28/15 1156  APTT 38*  INR 1.35    Sepsis Markers  Recent Labs Lab 12/28/15 0012 12/28/15 0300  LATICACIDVEN 6.0* 2.2*    ABG  Recent Labs Lab 12/22/15 0850 12/25/15 1000 12/28/15 0416  PHART 7.40 7.52* 7.41  PCO2ART 40 32 47  PO2ART 67* 124* 175*    Liver Enzymes  Recent Labs Lab 12/26/15 1109 12/27/15 0419 12/28/15 0012  AST 20 20 45*  ALT '31 26 29  '$ ALKPHOS 101 111 96  BILITOT 1.1 0.8 0.7  ALBUMIN 1.8* 1.9* 1.6*    Cardiac Enzymes  Recent Labs Lab 12/28/15 0012  TROPONINI 0.27*    Glucose  Recent Labs Lab 12/27/15 1534 12/27/15 2001 12/27/15 2342 12/28/15 0402 12/28/15 0723 12/28/15 1121  GLUCAP 214* 300* 329* 341* 309* 311*   Dg Chest 1 View  12/28/2015  CLINICAL DATA:  70 year old male with cardiac arrest. History of right lobectomy. EXAM: CHEST 1 VIEW COMPARISON:  Radiograph dated 12/27/2015 FINDINGS: Endotracheal tube above the carina, an enteric tube coursing towards the left upper abdomen with tip extending beyond the inferior margin of the image. Left-sided PICC with tip over right cardiac border similar to prior study. The lungs are hyperexpanded with evidence of mild emphysema. Bilateral upper lobe surgical sutures noted. Patchy area of airspace opacity noted in the left upper lung field stable or slightly progressed compared the prior study. The previously seen bibasilar airspace densities and improved. There is no pleural effusion. No  pneumothorax. There is moderate cardiomegaly. Median sternotomy wires and CABG vascular clips noted. No acute osseous pathology. IMPRESSION: Persistent area of hazy airspace density in the left upper lung field. Interval improvement of the airspace densities seen at the lung bases. Electronically Signed   By: Anner Crete M.D.   On: 12/28/2015 01:03   EVENTS/DATA: 02/10 >> LLE angiogram, thrombolysis, thrombectomy, angioplasty 02/11 Intubated for suspected PNA 02/11 elevated trop I. Pk trop I 62.6 on 02/20 02/12 CT head: New nonhemorrhagic infarcts involving the right PCA territory and bilateral cerebellum, left greater than right 02/17 L AKA 02/18>extubated 02/21 TTE: LVEF 55-60% 02/21 RUE [place]: no DVT 02/21>Re-intubated 02/22 >CT head: Interval evolution of bilateral cerebellar and right PCA territory infarcts 02/27-remains intubated,discussion with family-proceed with trach and PEG 12/24/2015: Attempted EGD and PEG placement resulting in complication of bowel perforation 03/04-Asystole arrest; CPR x 12 minutes with ROSC  INDWELLING DEVICES:: ETT 02/11 >> 02/18, 02/21 >>  R IJ CVL 02/10 >> removed PICC line RT  MICRO DATA: MRSA PCR 02/13 >> NEG Urine 02/12 >> NEG Resp 02/11 >> MSSA C diff 02/16 >> NEG Blood 02/12 >> NEG Urine 02/19 >> NEG C diff 02/21 >> NEG Cath tip 02/19 >> NEG Resp 02/18 >> light growth candida Urine 02/21 >> NEG MRSA PCR 02/22 >> NEG Respiratory 02/26>>  ANTIMICROBIALS:  Vanc 02/11 >> 02/23 Pip-tazo 02/11 >> 02/20 Ceftaz 02/21 >> 02/24 Metronidazole 02/21 >> 02/24 Pip-tazo 02/24 >>  Vancomycin 02/25>>  ASSESSMENT / PLAN: 70 YO male with 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 Now with acute respiratory failure, intubated due to pneumonia.  Underwent a left AKA 02/17, failed extubation and prolonged ventilator dependence, bowel perforation s/p peg tube placement attempt, and cardiac arrest. Clinical presentation consistent with anoxic brain injury. Given his multiple co-morbidities and frail status, his overall prognosis is very poor.  PULMONARY A: Prolonged VDRF Failed extubation attempt Bilateral pulmonary infiltrates and fever- c/w  PNA P:   -Cont vent support -settings reviewed -Cont vent bundle -Daily SBT if/when meets criteria -Antibiotics as above  -CXR in am -Trach per ENT  CARDIOVASCULAR A:  Cardiac arrest Recurrent NSTEMI PAF, NSR presently NSVT Acute CHF with proBNP>45000; Echo at bedside revealed ejection fraction 41% with moderate to severe hypokinesis of the septum and inferior wall moderate mitral regurgitation and severe pulmonary hypertension with PA systolic pressure 67 mmHg  P:  -Seen by cardiology post-code, appreciate recs -Heparin gtt per cardilogy -MAP goal > 65 mmHg -Cont ASA -Continue amiodarone  -Hemodynamics per ICU protocol -f/u 2-D echo report  RENAL A:   AKI, nonoliguric-improved  Very poor candidate for HD P:   -Monitor BMET intermittently -Monitor I/Os -Correct electrolytes as indicated -Continue free water flushes  GASTROINTESTINAL  A:   No issues P:   -SUP: enteral PPI -Now with bowel/stomach perforation -Awaiting GI and surgery recs -Continue TPN  HEMATOLOGIC A:   ICU acquired anemia without acute blood loss P:  -DVT px: SQ heparin -Monitor CBC intermittently -Transfuse per usual guidelines   INFECTIOUS A:   Severe sepsis-persistent leukocytosis; no fever in 24hrs LLE gangrene Aspiration PNA P:   -Vanco and zosyn, antifungal therapy - Repeat cultures if febrile -Tylenol and motrin prn for fever >103 - Follow up ID recs  ENDOCRINE A:   Type 2 DM-Persistent hyperglycemia P:   -Off insulin infusion -Continue blood gluocose monitoring per ICU  protocol -Change sliding scale to resistant  NEUROLOGIC A:   Multifocal CVAs, acute Acute encewphalopathy H/O seizures - not on chronic AEDs PTA P:   RASS goal: -1, -2 -Fentanyl gtt and prn versed for vent sedation  Family update/Disposition: Spoke at length with patient's daughter on March 4 in patient's room. I reviewed the patient's current treatment plan, imaging, imaging studies,, and daily clinical evaluations. Patient is making no interval progress and his overall prognosis is very poor. I did review comfort care and palliative care options. Patient's daughter, but she indicated that " they want to go to the max with patient's treatment" even though I clearly articulated to that any invasive interventions at this point have a significantly high risk of complications as well as an impact on the patient's already poor quality of life. This morning there was no family at the bedside during rounds. However, they were updated by the primary team last night following patient's cardiac arrest. They still indicated that they wanted to proceed with all invasive interventions, and maintain patient is a full code. Lurline Idol is planned for tomorrow by ENT. We will continue to review patient's prognosis and recommend more conservative plan of care that is for focused on quality end-of-life care   CCM time is 50 minutes  Magdalene S. Tukov ANP-BC Pulmonary and Critical Care Medicine [facility] Pager [phone]  The patient was seen and examined with the nurse practitioner, I reviewed her assessment and plan and agree with the assessment and plan as stated above.  Marda Stalker M.D.  Critical Care Attestation.  I have personally obtained a history, examined the patient, evaluated laboratory and imaging results, formulated the assessment and plan and placed orders. The Patient requires high complexity decision making for assessment and support, frequent evaluation and titration of  therapies, application of advanced monitoring technologies and extensive interpretation of multiple databases. The patient has critical illness that could lead imminently to failure of 1 or more organ systems and requires the highest level of physician preparedness to intervene.  Critical Care Time devoted to patient care services described in this note is 35 minutes and is exclusive of time spent in procedures.

## 2015-12-28 NOTE — Progress Notes (Signed)
[facility] Progress Note Patient Name: Stephen Camacho DOB: 1946/01/22 MRN: [id number]   Date of Service  12/28/2015  HPI/Events of Note  Code episode.  Now with ongoing hyperglycemia.  Had elevated lactate following code.    eICU Interventions  Plan: Changed to q4 hour SSI - resistant scale Will check ABG     Intervention Category Intermediate Interventions: Hyperglycemia - evaluation and treatment  DETERDING,ELIZABETH 12/28/2015, 2:14 AM

## 2015-12-28 NOTE — Progress Notes (Signed)
Patient with audible wheezing. Suctioned pink froth from inline x 3 passes. Iona Beard with respiratory at bedside. Patient becoming anxious with increase in heart rate, BP, respiratory rate, and diaphoretic. Fentanyl turned back up for patient comfort.

## 2015-12-28 NOTE — Progress Notes (Addendum)
PARENTERAL NUTRITION CONSULT NOTE - FOLLOW UP   Pharmacy Consult for TPN Electrolyte and Glucose Monitoirng  Indication: Bowel Perforation  No Known Allergies  Patient Measurements: Height: '6\' 4"'$  (193 cm) Weight: 134 lb 7.7 oz (61 kg) IBW/kg (Calculated) : 86.8   Vital Signs: Temp: 99 F (37.2 C) (03/05 0600) Temp Source: Rectal (03/05 0400) BP: 101/61 mmHg (03/05 0600) Intake/Output from previous day: 03/04 0701 - 03/05 0700 In: 2408.8 [I.V.:615.2; IV Piggyback:300; ZOX:0960.4] Out: 5409 [Urine:1750] Intake/Output from this shift:    Labs:  Recent Labs  12/27/15 0419 12/28/15 0012 12/28/15 0519  WBC 18.5* 16.7* 17.8*  HGB 7.9* 7.3* 7.3*  HCT 25.6* 25.5* 24.0*  PLT 486* 428 392     Recent Labs  12/26/15 1109 12/27/15 0419 12/28/15 0012 12/28/15 0519  NA 147* 148* 145 149*  K 3.8 3.6 3.2* 3.5  CL 112* 113* 109 114*  CO2 '25 28 24 30  '$ GLUCOSE 229* 326* 600* 416*  BUN 33* 32* 29* 34*  CREATININE 1.34* 1.24 1.26* 1.22  CALCIUM 8.1* 7.9* 7.4* 7.8*  MG 1.9 2.0  --  2.3  PHOS 3.5 3.2  --  3.7  PROT 6.5 6.4* 5.7*  --   ALBUMIN 1.8* 1.9* 1.6*  --   AST 20 20 45*  --   ALT '31 26 29  '$ --   ALKPHOS 101 111 96  --   BILITOT 1.1 0.8 0.7  --   TRIG  --  139  --   --    Estimated Creatinine Clearance: 48.6 mL/min (by C-G formula based on Cr of 1.22).    Recent Labs  12/27/15 2342 12/28/15 0402 12/28/15 0723  GLUCAP 329* 341* 309*    Medical History: Past Medical History  Diagnosis Date  . Hyperlipidemia   . Hypertension   . Diabetes mellitus ([place])   . CAD (coronary artery disease)     a. s/p 2 v CABG in 2012 (LIMA-LAD & SVG-OM); b. cath 07/2012 s/p PCI/DES to ostial LCx and OM  . PAD (peripheral artery disease) ([place])     a. s/p right SFA stent; b. s/p right toe amputation 2011; c. s/p right AKA spring 2016  . Seizures ([place])   . Gangrene of foot ([place])   . Wheezing   . Lung cancer ([place])   . Prostate cancer ([place])   . Stroke [facility])      Medications:  Scheduled:  . sodium chloride   Intravenous Once  . anidulafungin  100 mg Intravenous Q24H  . antiseptic oral rinse  7 mL Mouth Rinse 10 times per day  . chlorhexidine gluconate  15 mL Mouth Rinse BID  . enoxaparin (LOVENOX) injection  40 mg Subcutaneous Q24H  . insulin aspart  0-20 Units Subcutaneous 6 times per day  . insulin glargine  20 Units Subcutaneous Daily  . pantoprazole (PROTONIX) IV  40 mg Intravenous Q24H  . piperacillin-tazobactam (ZOSYN)  IV  3.375 g Intravenous 3 times per day  . vancomycin  750 mg Intravenous Q18H   Infusions:  . Marland KitchenTPN (CLINIMIX-E) Adult 83 mL/hr at 12/28/15 0600  . amiodarone 30 mg/hr (12/28/15 0649)  . fentaNYL 10 mcg/ml infusion 250 mcg/hr (12/28/15 0600)  . norepinephrine (LEVOPHED) Adult infusion Stopped (12/26/15 2151)    Insulin Requirements in the past 24 hours:    Current Nutrition:  Clinimix E 5/15 at 83 ml/min   Assessment: 70 y/o M with multiple medical problems including bowel perforation.  SSI requirement over past 24 hours: 52  units   Plan:  Patient is on SSI which will be continued while patient is on TPN. Will increase to  Lantus 20 units qhs.  MD ordered Ca chloride 1g and KPhos 40 mEq x1  Electrolytes are WNL today; however surgery would like Potassium closer to 4. Potassium level ordered for 16:00.   Pharmacy will continue to monitor and adust per consult.   Darci Lykins D, Pharm.D., BCPS Clinical Pharmacist 12/28/2015,8:44 AM

## 2015-12-28 NOTE — Progress Notes (Cosign Needed)
Stephen Camacho is a 70 y.o. male  [id number]  Primary Cardiologist. Neoma Laming Reason for Consultation: Cardiac arrest  HPI: This is a 70 year old African-American male with a past medical history of hyperlipidemia hypertension diabetes coronary artery disease status post CABG presented to the hospital for PEG placement and had right above-knee amputation but this morning had a cardiac arrest after initially having ventricular tachycardia and then went into asystole. Patient is intubated and unable to give any history.   Review of Systems: Unable to get review of systems   Past Medical History  Diagnosis Date  . Hyperlipidemia   . Hypertension   . Diabetes mellitus ([place])   . CAD (coronary artery disease)     a. s/p 2 v CABG in 2012 (LIMA-LAD & SVG-OM); b. cath 07/2012 s/p PCI/DES to ostial LCx and OM  . PAD (peripheral artery disease) ([place])     a. s/p right SFA stent; b. s/p right toe amputation 2011; c. s/p right AKA spring 2016  . Seizures ([place])   . Gangrene of foot ([place])   . Wheezing   . Lung cancer ([place])   . Prostate cancer ([place])   . Stroke [facility])     Medications Prior to Admission  Medication Sig Dispense Refill  . acetaminophen (TYLENOL) 650 MG CR tablet Take 650 mg by mouth every 8 (eight) hours as needed for pain.    Marland Kitchen aspirin 81 MG chewable tablet Chew 81 mg by mouth daily.    Marland Kitchen atorvastatin (LIPITOR) 40 MG tablet Take 40 mg by mouth daily at 6 PM.    . cilostazol (PLETAL) 50 MG tablet Take 50 mg by mouth 2 (two) times daily.    . insulin glargine (LANTUS) 100 UNIT/ML injection Inject 10 Units into the skin at bedtime.    . isosorbide mononitrate (IMDUR) 60 MG 24 hr tablet Take 60 mg by mouth daily.    Marland Kitchen lisinopril (PRINIVIL,ZESTRIL) 10 MG tablet Take 10 mg by mouth daily.    . meclizine (ANTIVERT) 25 MG tablet Take 1 tablet (25 mg total) by mouth 3 (three) times daily as needed for dizziness. 30 tablet 0  . metFORMIN (GLUCOPHAGE) 500 MG tablet Take by mouth 2  (two) times daily with a meal.    . metoprolol (LOPRESSOR) 50 MG tablet Take 50 mg by mouth 2 (two) times daily.    . nitroGLYCERIN (NITROSTAT) 0.4 MG SL tablet Place 0.4 mg under the tongue every 5 (five) minutes as needed for chest pain.    Marland Kitchen ondansetron (ZOFRAN) 4 MG tablet Take 1 tablet (4 mg total) by mouth every 8 (eight) hours as needed for nausea or vomiting. 21 tablet 0  . rivaroxaban (XARELTO) 20 MG TABS tablet Take 20 mg by mouth daily with supper.    . traMADol (ULTRAM) 50 MG tablet Take 25-50 mg by mouth every 6 (six) hours as needed for severe pain.        . sodium chloride   Intravenous Once  . anidulafungin  100 mg Intravenous Q24H  . antiseptic oral rinse  7 mL Mouth Rinse 10 times per day  . chlorhexidine gluconate  15 mL Mouth Rinse BID  . enoxaparin (LOVENOX) injection  40 mg Subcutaneous Q24H  . insulin aspart  0-20 Units Subcutaneous 6 times per day  . insulin glargine  20 Units Subcutaneous Daily  . pantoprazole (PROTONIX) IV  40 mg Intravenous Q24H  . piperacillin-tazobactam (ZOSYN)  IV  3.375 g Intravenous 3 times per day  .  vancomycin  750 mg Intravenous Q18H    Infusions: . Marland KitchenTPN (CLINIMIX-E) Adult 83 mL/hr at 12/28/15 0600  . amiodarone 30 mg/hr (12/28/15 0649)  . fentaNYL 10 mcg/ml infusion 250 mcg/hr (12/28/15 0850)  . norepinephrine (LEVOPHED) Adult infusion Stopped (12/26/15 2151)    No Known Allergies  Social History   Social History  . Marital Status: Widowed    Spouse Name: N/A  . Number of Children: N/A  . Years of Education: N/A   Occupational History  . Not on file.   Social History Main Topics  . Smoking status: Never Smoker   . Smokeless tobacco: Not on file  . Alcohol Use: No  . Drug Use: Not on file  . Sexual Activity: Not on file   Other Topics Concern  . Not on file   Social History Narrative    Family History  Problem Relation Age of Onset  . Family history unknown: Yes    PHYSICAL EXAM: Filed Vitals:   12/28/15  0800 12/28/15 0900  BP: 127/73 132/77  Pulse:    Temp: 99.1 F (37.3 C) 99.3 F (37.4 C)  Resp: 16 17     Intake/Output Summary (Last 24 hours) at 12/28/15 1015 Last data filed at 12/28/15 0646  Gross per 24 hour  Intake 2391.28 ml  Output   1750 ml  Net 641.28 ml    General:  Well appearing. No respiratory difficulty HEENT: normal Neck: supple. no JVD. Carotids 2+ bilat; no bruits. No lymphadenopathy or thryomegaly appreciated. Cor: PMI nondisplaced. Regular rate & rhythm. No rubs, gallops or murmurs. Lungs: clear Abdomen: soft, nontender, nondistended. No hepatosplenomegaly. No bruits or masses. Good bowel sounds. Extremities: no cyanosis, clubbing, rash, edema Neuro: alert & oriented x 3, cranial nerves grossly intact. moves all 4 extremities w/o difficulty. Affect pleasant.  ECG: Sinus rhythm with old anteroseptal wall MI and occasional PVCs  Results for orders placed or performed during the hospital encounter of 12/05/15 (from the past 24 hour(s))  Glucose, capillary     Status: Abnormal   Collection Time: 12/27/15 11:41 AM  Result Value Ref Range   Glucose-Capillary 226 (H) 65 - 99 mg/dL  Glucose, capillary     Status: Abnormal   Collection Time: 12/27/15  3:34 PM  Result Value Ref Range   Glucose-Capillary 214 (H) 65 - 99 mg/dL  Glucose, capillary     Status: Abnormal   Collection Time: 12/27/15  8:01 PM  Result Value Ref Range   Glucose-Capillary 300 (H) 65 - 99 mg/dL  Glucose, capillary     Status: Abnormal   Collection Time: 12/27/15 11:42 PM  Result Value Ref Range   Glucose-Capillary 329 (H) 65 - 99 mg/dL  CBC     Status: Abnormal   Collection Time: 12/28/15 12:12 AM  Result Value Ref Range   WBC 16.7 (H) 3.8 - 10.6 K/uL   RBC 2.65 (L) 4.40 - 5.90 MIL/uL   Hemoglobin 7.3 (L) 13.0 - 18.0 g/dL   HCT 25.5 (L) 40.0 - 52.0 %   MCV 96.2 80.0 - 100.0 fL   MCH 27.7 26.0 - 34.0 pg   MCHC 28.8 (L) 32.0 - 36.0 g/dL   RDW 19.6 (H) 11.5 - 14.5 %   Platelets 428  150 - 440 K/uL  Comprehensive metabolic panel     Status: Abnormal   Collection Time: 12/28/15 12:12 AM  Result Value Ref Range   Sodium 145 135 - 145 mmol/L   Potassium 3.2 (L) 3.5 - 5.1  mmol/L   Chloride 109 101 - 111 mmol/L   CO2 24 22 - 32 mmol/L   Glucose, Bld 600 (HH) 65 - 99 mg/dL   BUN 29 (H) 6 - 20 mg/dL   Creatinine, Ser 1.26 (H) 0.61 - 1.24 mg/dL   Calcium 7.4 (L) 8.9 - 10.3 mg/dL   Total Protein 5.7 (L) 6.5 - 8.1 g/dL   Albumin 1.6 (L) 3.5 - 5.0 g/dL   AST 45 (H) 15 - 41 U/L   ALT 29 17 - 63 U/L   Alkaline Phosphatase 96 38 - 126 U/L   Total Bilirubin 0.7 0.3 - 1.2 mg/dL   GFR calc non Af Amer 56 (L) >60 mL/min   GFR calc Af Amer >60 >60 mL/min   Anion gap 12 5 - 15  Fibrin derivatives D-Dimer ([facility] only)     Status: Abnormal   Collection Time: 12/28/15 12:12 AM  Result Value Ref Range   Fibrin derivatives D-dimer ([facility]) >7500 (H) 0 - 499  Troponin I (q 6hr x 3)     Status: Abnormal   Collection Time: 12/28/15 12:12 AM  Result Value Ref Range   Troponin I 0.27 (H) <0.031 ng/mL  Lactic acid, plasma     Status: Abnormal   Collection Time: 12/28/15 12:12 AM  Result Value Ref Range   Lactic Acid, Venous 6.0 (HH) 0.5 - 2.0 mmol/L  Brain natriuretic peptide     Status: Abnormal   Collection Time: 12/28/15 12:12 AM  Result Value Ref Range   B Natriuretic Peptide >4500.0 (H) 0.0 - 100.0 pg/mL  Lactic acid, plasma     Status: Abnormal   Collection Time: 12/28/15  3:00 AM  Result Value Ref Range   Lactic Acid, Venous 2.2 (HH) 0.5 - 2.0 mmol/L  Glucose, capillary     Status: Abnormal   Collection Time: 12/28/15  4:02 AM  Result Value Ref Range   Glucose-Capillary 341 (H) 65 - 99 mg/dL  Blood gas, arterial     Status: Abnormal   Collection Time: 12/28/15  4:16 AM  Result Value Ref Range   FIO2 0.60    Delivery systems VENTILATOR    Mode PRESSURE REGULATED VOLUME CONTROL    VT 500 mL   Peep/cpap 5.0 cm H20   pH, Arterial 7.41 7.350 - 7.450   pCO2 arterial 47 32.0  - 48.0 mmHg   pO2, Arterial 175 (H) 83.0 - 108.0 mmHg   Bicarbonate 29.8 (H) 21.0 - 28.0 mEq/L   Acid-Base Excess 4.7 (H) 0.0 - 3.0 mmol/L   O2 Saturation 99.6 %   Patient temperature 37.0    Collection site RIGHT RADIAL    Sample type ARTERIAL DRAW    Allens test (pass/fail) PASS PASS   Mechanical Rate 15   Basic metabolic panel     Status: Abnormal   Collection Time: 12/28/15  5:19 AM  Result Value Ref Range   Sodium 149 (H) 135 - 145 mmol/L   Potassium 3.5 3.5 - 5.1 mmol/L   Chloride 114 (H) 101 - 111 mmol/L   CO2 30 22 - 32 mmol/L   Glucose, Bld 416 (H) 65 - 99 mg/dL   BUN 34 (H) 6 - 20 mg/dL   Creatinine, Ser 1.22 0.61 - 1.24 mg/dL   Calcium 7.8 (L) 8.9 - 10.3 mg/dL   GFR calc non Af Amer 58 (L) >60 mL/min   GFR calc Af Amer >60 >60 mL/min   Anion gap 5 5 - 15  Magnesium  Status: None   Collection Time: 12/28/15  5:19 AM  Result Value Ref Range   Magnesium 2.3 1.7 - 2.4 mg/dL  Phosphorus     Status: None   Collection Time: 12/28/15  5:19 AM  Result Value Ref Range   Phosphorus 3.7 2.5 - 4.6 mg/dL  CBC     Status: Abnormal   Collection Time: 12/28/15  5:19 AM  Result Value Ref Range   WBC 17.8 (H) 3.8 - 10.6 K/uL   RBC 2.65 (L) 4.40 - 5.90 MIL/uL   Hemoglobin 7.3 (L) 13.0 - 18.0 g/dL   HCT 24.0 (L) 40.0 - 52.0 %   MCV 90.7 80.0 - 100.0 fL   MCH 27.5 26.0 - 34.0 pg   MCHC 30.3 (L) 32.0 - 36.0 g/dL   RDW 18.2 (H) 11.5 - 14.5 %   Platelets 392 150 - 440 K/uL  Glucose, capillary     Status: Abnormal   Collection Time: 12/28/15  7:23 AM  Result Value Ref Range   Glucose-Capillary 309 (H) 65 - 99 mg/dL  Type and screen [facility]     Status: None (Preliminary result)   Collection Time: 12/28/15  8:53 AM  Result Value Ref Range   ABO/RH(D) B POS    Antibody Screen NEG    Sample Expiration 12/31/2015    Unit Number [id number]    Blood Component Type RED CELLS,LR    Unit division 00    Status of Unit ALLOCATED    Transfusion Status OK  TO TRANSFUSE    Crossmatch Result Compatible   Prepare RBC     Status: None   Collection Time: 12/28/15  8:53 AM  Result Value Ref Range   Order Confirmation ORDER PROCESSED BY BLOOD BANK    Dg Chest 1 View  12/28/2015  CLINICAL DATA:  70 year old male with cardiac arrest. History of right lobectomy. EXAM: CHEST 1 VIEW COMPARISON:  Radiograph dated 12/27/2015 FINDINGS: Endotracheal tube above the carina, an enteric tube coursing towards the left upper abdomen with tip extending beyond the inferior margin of the image. Left-sided PICC with tip over right cardiac border similar to prior study. The lungs are hyperexpanded with evidence of mild emphysema. Bilateral upper lobe surgical sutures noted. Patchy area of airspace opacity noted in the left upper lung field stable or slightly progressed compared the prior study. The previously seen bibasilar airspace densities and improved. There is no pleural effusion. No pneumothorax. There is moderate cardiomegaly. Median sternotomy wires and CABG vascular clips noted. No acute osseous pathology. IMPRESSION: Persistent area of hazy airspace density in the left upper lung field. Interval improvement of the airspace densities seen at the lung bases. Electronically Signed   By: Anner Crete M.D.   On: 12/28/2015 01:03   Dg Chest 1 View  12/27/2015  CLINICAL DATA:  History of right lung lobectomy and CABG with PEG placement 3 days ago, evaluate for free intraperitoneal air EXAM: CHEST 1 VIEW COMPARISON:  12/22/2015 FINDINGS: No change in endotracheal tube, NG tube, or left PICC line. Diffuse bilateral airspace disease again identified. Improved aeration in the previously consolidated left lower lobe. On the right, there is increased opacity involving the mid to lower lung zone. No pneumoperitoneum identified on this upright image. IMPRESSION: Continued bilateral airspace disease, with some improvement in the left lower lobe but with interval increase in severity of  consolidation right mid to lower lung zone. Pneumoperitoneum not identified. Electronically Signed   By: Elodia Florence.D.  On: 12/27/2015 10:29   Dg Abd 1 View  12/27/2015  CLINICAL DATA:  Nasogastric placement. EXAM: ABDOMEN - 1 VIEW COMPARISON:  Chest radiograph of earlier in the day. FINDINGS: Nasogastric terminates at the body of the stomach. Endotracheal tube terminates 3.5 cm above carina. Nonobstructive visualized bowel gas pattern. Possible residual free intraperitoneal air in the left upper quadrant. IMPRESSION: Nasogastric terminating at the body of the stomach. Electronically Signed   By: Abigail Miyamoto M.D.   On: 12/27/2015 11:50     ASSESSMENT AND PLAN: Cardiac arrest with a history of coronary artery disease and peripheral vascular disease status post right above-knee amputation now intubated with respiratory failure. EKG shows anteroseptal wall MI age of the MI is uncertain. Echocardiogram on the bedside revealed ejection fraction 41% with moderate to severe hypokinesis of the septum and inferior wall moderate mitral regurgitation and severe pulmonary hypertension with PA systolic pressure 67 mmHg. Advise giving anticoagulation with heparin or Lovenox and treat conservatively.  Elliott Quade A

## 2015-12-28 NOTE — Significant Event (Addendum)
A code blue was called on Mr Leib who is a 70 yo black male admitted due to lower extremity gangrene and has subsequently undergone bilateral AKA. Hospital course has been complicated by multiple CVAs, bowel perforation and recuurent respiratory failure requiring intubation and mech ventilation.  Patient was said to have had a run of NSVT before transitioning to bradycardia (30bpm) and asytole. He was coded per ACLS protocol and he received atropine and 2 doses of epinephrine and bicarbonate. After a pulse was obtained, he went into v tach again which necessitated defibrillation x 1.  He was given amiodarone 189m and is currently on amiodarone infusion.   Assessment/Plan  - Patient had a cardiac arrest and has a poor prognosis. [facility] who aggress with current plan. Continue amiodarone infusion and use levofed for MAP < 65. CXR, CBC, CMP, Ddimer, troponin and lactic acid are pending. Follow results.   I spoke with patient's son ([name] and he is on the way to the hospital. Treatment goals will have to be readdressed due to very poor prognosis. ===============================  I just met with Patient's son and sister and had an extensive discussion about patients clinical state and treatment goals. Family insist that due to their eprsonal beliefs they want patient to remain FULL CODE.

## 2015-12-28 NOTE — Progress Notes (Signed)
ANTICOAGULATION CONSULT NOTE - Initial Consult  Pharmacy Consult for Heparin Indication: chest pain/ACS  No Known Allergies  Patient Measurements: Height: '6\' 4"'$  (193 cm) Weight: 134 lb 7.7 oz (61 kg) IBW/kg (Calculated) : 86.8 Heparin Dosing Weight: 61 kg  Vital Signs: Temp: 99.3 F (37.4 C) (03/05 0900) Temp Source: Rectal (03/05 0400) BP: 132/77 mmHg (03/05 0900)  Labs:  Recent Labs  12/27/15 0419 12/28/15 0012 12/28/15 0519  HGB 7.9* 7.3* 7.3*  HCT 25.6* 25.5* 24.0*  PLT 486* 428 392  CREATININE 1.24 1.26* 1.22  TROPONINI  --  0.27*  --     Estimated Creatinine Clearance: 48.6 mL/min (by C-G formula based on Cr of 1.22).   Assessment: 70 yo male with hx of CAD s/p cardiac arrest to start on heparin drip Hgb 7.3 ordered to receive blood today, Plt 392 - per CCU RN will check Hgb one hour after blood is transfused aptt and INR ordered --> INR 1.35, aPTT 38 Pt received enoxaparin 40 mg at 1124  Goal of Therapy:  Heparin level 0.3-0.7 units/ml Monitor platelets by anticoagulation protocol: Yes   Plan:  Will not bolus due to proximity to enoxaparin dose Start Heparin drip at 700 units/hr (=34m//hr) Anti-Xa level in 8h - 3/5 at 2100 CBC in AM  WRayna SextonL 12/28/2015,11:58 AM

## 2015-12-28 NOTE — Progress Notes (Signed)
4 Days Post-Op  Subjective: Events overnight noted. Coded x 2. Currently stable but remains critical. Appreciate other MD and RN input and care. Family updated last night and wish to continue all measures.  Objective: Vital signs in last 24 hours: Temp:  [97.5 F (36.4 C)-99.1 F (37.3 C)] 99 F (37.2 C) (03/05 0600) Resp:  [12-22] 21 (03/05 0600) BP: (91-149)/(57-107) 101/61 mmHg (03/05 0600) SpO2:  [66 %-100 %] 100 % (03/05 0600) FiO2 (%):  [30 %-60 %] 40 % (03/05 0453) Weight:  [61 kg (134 lb 7.7 oz)] 61 kg (134 lb 7.7 oz) (03/05 0500) Last BM Date: 12/26/15  Intake/Output from previous day: 03/04 0701 - 03/05 0700 In: 2408.8 [I.V.:615.2; IV Piggyback:300; TPN:1493.6] Out: 1750 [Urine:1750] Intake/Output this shift:    Gen: Intubated, eyes open but no response to commands CV: RR Lungs: CTA  ABD: soft, NT/ND Ext: warm, palpable femoral pulses, stump incisions C/D/I  Lab Results:   Recent Labs  12/28/15 0012 12/28/15 0519  WBC 16.7* 17.8*  HGB 7.3* 7.3*  HCT 25.5* 24.0*  PLT 428 392   BMET  Recent Labs  12/28/15 0012 12/28/15 0519  NA 145 149*  K 3.2* 3.5  CL 109 114*  CO2 24 30  GLUCOSE 600* 416*  BUN 29* 34*  CREATININE 1.26* 1.22  CALCIUM 7.4* 7.8*   PT/INR No results for input(s): LABPROT, INR in the last 72 hours. ABG  Recent Labs  12/25/15 1000 12/28/15 0416  PHART 7.52* 7.41  HCO3 26.1 29.8*    Studies/Results: Dg Chest 1 View  12/28/2015  CLINICAL DATA:  70 year old male with cardiac arrest. History of right lobectomy. EXAM: CHEST 1 VIEW COMPARISON:  Radiograph dated 12/27/2015 FINDINGS: Endotracheal tube above the carina, an enteric tube coursing towards the left upper abdomen with tip extending beyond the inferior margin of the image. Left-sided PICC with tip over right cardiac border similar to prior study. The lungs are hyperexpanded with evidence of mild emphysema. Bilateral upper lobe surgical sutures noted. Patchy area of airspace  opacity noted in the left upper lung field stable or slightly progressed compared the prior study. The previously seen bibasilar airspace densities and improved. There is no pleural effusion. No pneumothorax. There is moderate cardiomegaly. Median sternotomy wires and CABG vascular clips noted. No acute osseous pathology. IMPRESSION: Persistent area of hazy airspace density in the left upper lung field. Interval improvement of the airspace densities seen at the lung bases. Electronically Signed   By: Anner Crete M.D.   On: 12/28/2015 01:03   Dg Chest 1 View  12/27/2015  CLINICAL DATA:  History of right lung lobectomy and CABG with PEG placement 3 days ago, evaluate for free intraperitoneal air EXAM: CHEST 1 VIEW COMPARISON:  12/22/2015 FINDINGS: No change in endotracheal tube, NG tube, or left PICC line. Diffuse bilateral airspace disease again identified. Improved aeration in the previously consolidated left lower lobe. On the right, there is increased opacity involving the mid to lower lung zone. No pneumoperitoneum identified on this upright image. IMPRESSION: Continued bilateral airspace disease, with some improvement in the left lower lobe but with interval increase in severity of consolidation right mid to lower lung zone. Pneumoperitoneum not identified. Electronically Signed   By: Skipper Cliche M.D.   On: 12/27/2015 10:29   Dg Abd 1 View  12/27/2015  CLINICAL DATA:  Nasogastric placement. EXAM: ABDOMEN - 1 VIEW COMPARISON:  Chest radiograph of earlier in the day. FINDINGS: Nasogastric terminates at the body of the stomach.  Endotracheal tube terminates 3.5 cm above carina. Nonobstructive visualized bowel gas pattern. Possible residual free intraperitoneal air in the left upper quadrant. IMPRESSION: Nasogastric terminating at the body of the stomach. Electronically Signed   By: Abigail Miyamoto M.D.   On: 12/27/2015 11:50    Anti-infectives: Anti-infectives    Start     Dose/Rate Route Frequency  Ordered Stop   12/26/15 0600  vancomycin (VANCOCIN) IVPB 750 mg/150 ml premix     750 mg 150 mL/hr over 60 Minutes Intravenous Every 18 hours 12/26/15 0526     12/24/15 0000  vancomycin (VANCOCIN) IVPB 1000 mg/200 mL premix  Status:  Discontinued     1,000 mg 200 mL/hr over 60 Minutes Intravenous Every 18 hours 12/23/15 2300 12/26/15 0526   12/23/15 1800  anidulafungin (ERAXIS) 100 mg in sodium chloride 0.9 % 100 mL IVPB     100 mg over 90 Minutes Intravenous Every 24 hours 12/22/15 2016     12/22/15 2130  anidulafungin (ERAXIS) 200 mg in sodium chloride 0.9 % 200 mL IVPB     200 mg over 180 Minutes Intravenous  Once 12/22/15 2016 12/23/15 0046   12/21/15 1630  vancomycin (VANCOCIN) IVPB 750 mg/150 ml premix  Status:  Discontinued     750 mg 150 mL/hr over 60 Minutes Intravenous Every 18 hours 12/21/15 0936 12/23/15 2259   12/21/15 0945  vancomycin (VANCOCIN) IVPB 750 mg/150 ml premix     750 mg 150 mL/hr over 60 Minutes Intravenous STAT 12/21/15 0936 12/21/15 1051   12/19/15 1701  cefTAZidime (FORTAZ) 1 g in dextrose 5 % 50 mL IVPB  Status:  Discontinued     1 g 100 mL/hr over 30 Minutes Intravenous Every 8 hours 12/19/15 1116 12/19/15 1523   12/19/15 1530  piperacillin-tazobactam (ZOSYN) IVPB 3.375 g     3.375 g 12.5 mL/hr over 240 Minutes Intravenous 3 times per day 12/19/15 1524     12/19/15 1300  vancomycin (VANCOCIN) IVPB 1000 mg/200 mL premix  Status:  Discontinued     1,000 mg 200 mL/hr over 60 Minutes Intravenous Every 24 hours 12/18/15 1614 12/19/15 0925   12/18/15 2200  cefTAZidime (FORTAZ) 1 g in dextrose 5 % 50 mL IVPB  Status:  Discontinued     1 g 100 mL/hr over 30 Minutes Intravenous Every 12 hours 12/18/15 1616 12/19/15 1116   12/18/15 1430  metroNIDAZOLE (FLAGYL) IVPB 500 mg  Status:  Discontinued     500 mg 100 mL/hr over 60 Minutes Intravenous Every 8 hours 12/18/15 1412 12/19/15 1523   12/17/15 1000  cefTAZidime (FORTAZ) 1 g in dextrose 5 % 50 mL IVPB  Status:   Discontinued     1 g 100 mL/hr over 30 Minutes Intravenous Every 24 hours 12/16/15 0638 12/18/15 1616   12/17/15 0200  vancomycin (VANCOCIN) 1,250 mg in sodium chloride 0.9 % 250 mL IVPB  Status:  Discontinued     1,250 mg 166.7 mL/hr over 90 Minutes Intravenous Every 36 hours 12/16/15 0635 12/18/15 1614   12/16/15 0645  vancomycin (VANCOCIN) 1,250 mg in sodium chloride 0.9 % 250 mL IVPB  Status:  Discontinued     1,250 mg 166.7 mL/hr over 90 Minutes Intravenous  Once 12/16/15 0635 12/18/15 1623   12/16/15 0645  cefTAZidime (FORTAZ) 1 g in dextrose 5 % 50 mL IVPB  Status:  Discontinued     1 g 100 mL/hr over 30 Minutes Intravenous  Once 12/16/15 7893 12/19/15 1523   12/12/15 0600  ceFAZolin (  ANCEF) IVPB 2 g/50 mL premix     2 g 100 mL/hr over 30 Minutes Intravenous On call to O.R. 12/11/15 1750 12/13/15 0559   12/10/15 0900  vancomycin (VANCOCIN) IVPB 750 mg/150 ml premix  Status:  Discontinued     750 mg 150 mL/hr over 60 Minutes Intravenous Every 36 hours 12/09/15 2301 12/10/15 1849   12/10/15 0600  vancomycin (VANCOCIN) IVPB 1000 mg/200 mL premix  Status:  Discontinued     1,000 mg 200 mL/hr over 60 Minutes Intravenous Every 24 hours 12/10/15 1849 12/11/15 1413   12/08/15 1800  vancomycin (VANCOCIN) IVPB 750 mg/150 ml premix  Status:  Discontinued     750 mg 150 mL/hr over 60 Minutes Intravenous Every 36 hours 12/07/15 0755 12/09/15 2301   12/07/15 0600  vancomycin (VANCOCIN) IVPB 750 mg/150 ml premix  Status:  Discontinued     750 mg 150 mL/hr over 60 Minutes Intravenous Every 18 hours 12/06/15 2020 12/07/15 0755   12/06/15 1930  vancomycin (VANCOCIN) IVPB 1000 mg/200 mL premix     1,000 mg 200 mL/hr over 60 Minutes Intravenous  Once 12/06/15 1854 12/06/15 2114   12/06/15 1515  piperacillin-tazobactam (ZOSYN) IVPB 3.375 g     3.375 g 12.5 mL/hr over 240 Minutes Intravenous 3 times per day 12/06/15 1502 12/15/15 0917   12/05/15 1445  [MAR Hold]  ceFAZolin (ANCEF) IVPB 1 g/50 mL  premix  Status:  Discontinued     (MAR Hold since 12/06/15 0910)  Comments:  Send with pt to OR   1 g 100 mL/hr over 30 Minutes Intravenous 3 times per day 12/05/15 1442 12/06/15 1104   12/05/15 1245  cefUROXime (ZINACEF) 1.5 g in dextrose 5 % 50 mL IVPB     1.5 g 100 mL/hr over 30 Minutes Intravenous  Once 12/05/15 1234 12/05/15 1305      Assessment/Plan: As family wishes all measures despite poor prognosis will continue to be aggressive with supportive measures Cardiac arrest Replete electrolyes Transfuse pRBC- cardiac disease, recent cardiac arrest and low HgB  Cardiology eval, possible PE?- CT, ECHO  Pharmacy consult- electrolyte and insulin replacement in TPN Continue supportive care  Jamesetta So A 12/28/2015

## 2015-12-28 NOTE — Progress Notes (Signed)
Called son back so MD can speak with him regarding patient's condition. MD informed pt about the current situation.

## 2015-12-28 NOTE — Progress Notes (Signed)
Pt. Hgb. 7.3 spoke with Dr. Jimmy Footman at [facility], no new orders at this time. Will continue to monitor pt. Closely.

## 2015-12-28 NOTE — Progress Notes (Signed)
Patient ID: Stephen Camacho, male   DOB: [birth year], 70 y.o.   MRN: [id number] Pt coded last night. Recovered, now on amio srio. NSR. Pt sedated and on vent. Abdomen is mildly distended, hypoactive bowel sounds. Hgb,WBC are unchanged.  Discussed with IM, tentative plan for trach tomorrow. OK to start feeding via ngt after trach.

## 2015-12-28 NOTE — Progress Notes (Signed)
PARENTERAL NUTRITION CONSULT NOTE - INITIAL  Pharmacy Consult for TPN Electrolyte and Glucose Monitoirng  Indication: Bowel Perforation  No Known Allergies  Patient Measurements: Height: '6\' 4"'$  (193 cm) Weight: 134 lb 7.7 oz (61 kg) IBW/kg (Calculated) : 86.8   Vital Signs: Temp: 99.1 F (37.3 C) (03/05 0500) Temp Source: Rectal (03/05 0400) BP: 105/60 mmHg (03/05 0500) Intake/Output from previous day: 03/04 0701 - 03/05 0700 In: 2217.5 [I.V.:556.9; IV Piggyback:250; TPN:1410.6] Out: 1265 [Urine:1265] Intake/Output from this shift: Total I/O In: 1426.9 [I.V.:346.9; IV Piggyback:250; TPN:830] Out: 490 [Urine:490]  Labs:  Recent Labs  12/27/15 0419 12/28/15 0012 12/28/15 0519  WBC 18.5* 16.7* 17.8*  HGB 7.9* 7.3* 7.3*  HCT 25.6* 25.5* 24.0*  PLT 486* 428 392     Recent Labs  12/26/15 1109 12/27/15 0419 12/28/15 0012 12/28/15 0519  NA 147* 148* 145 149*  K 3.8 3.6 3.2* 3.5  CL 112* 113* 109 114*  CO2 '25 28 24 30  '$ GLUCOSE 229* 326* 600* 416*  BUN 33* 32* 29* 34*  CREATININE 1.34* 1.24 1.26* 1.22  CALCIUM 8.1* 7.9* 7.4* 7.8*  MG 1.9 2.0  --  2.3  PHOS 3.5 3.2  --  3.7  PROT 6.5 6.4* 5.7*  --   ALBUMIN 1.8* 1.9* 1.6*  --   AST 20 20 45*  --   ALT '31 26 29  '$ --   ALKPHOS 101 111 96  --   BILITOT 1.1 0.8 0.7  --   TRIG  --  139  --   --    Estimated Creatinine Clearance: 48.6 mL/min (by C-G formula based on Cr of 1.22).    Recent Labs  12/27/15 2001 12/27/15 2342 12/28/15 0402  GLUCAP 300* 329* 341*    Medical History: Past Medical History  Diagnosis Date  . Hyperlipidemia   . Hypertension   . Diabetes mellitus ([place])   . CAD (coronary artery disease)     a. s/p 2 v CABG in 2012 (LIMA-LAD & SVG-OM); b. cath 07/2012 s/p PCI/DES to ostial LCx and OM  . PAD (peripheral artery disease) ([place])     a. s/p right SFA stent; b. s/p right toe amputation 2011; c. s/p right AKA spring 2016  . Seizures ([place])   . Gangrene of foot ([place])   . Wheezing   .  Lung cancer ([place])   . Prostate cancer ([place])   . Stroke [facility])     Medications:  Scheduled:  . anidulafungin  100 mg Intravenous Q24H  . antiseptic oral rinse  7 mL Mouth Rinse 10 times per day  . chlorhexidine gluconate  15 mL Mouth Rinse BID  . enoxaparin (LOVENOX) injection  40 mg Subcutaneous Q24H  . insulin aspart  0-20 Units Subcutaneous 6 times per day  . insulin glargine  10 Units Subcutaneous Daily  . pantoprazole (PROTONIX) IV  40 mg Intravenous Q24H  . piperacillin-tazobactam (ZOSYN)  IV  3.375 g Intravenous 3 times per day  . vancomycin  750 mg Intravenous Q18H   Infusions:  . Marland KitchenTPN (CLINIMIX-E) Adult 83 mL/hr at 12/28/15 0500  . amiodarone 60 mg/hr (12/28/15 0535)  . amiodarone    . fentaNYL 10 mcg/ml infusion 250 mcg/hr (12/28/15 0500)  . norepinephrine (LEVOPHED) Adult infusion Stopped (12/26/15 2151)    Insulin Requirements in the past 24 hours:    Current Nutrition:  Clinimix E 5/15 at 40 ml/hr  ADD: Spoke with Ebony Hail, RD and patient will be transition to goal rate of 83 ml/min  of Clinimix. RD concerned about glucose ranges over past 24 hours. Patient has been switched to moderate SSI   Assessment: 70 y/o M with multiple medical problems including bowel perforation to begin TPN therapy.   Plan:  Patient is on SSI which will be continued while patient is on TPN. Will start Lantus 10 units qhs.  Electrolytes are WNL today. Will f/u AM labs.   Pharmacy will continue to monitor and adust per consult.   Laural Benes, Pharm.D., BCPS Clinical Pharmacist 12/28/2015,6:06 AM

## 2015-12-29 DIAGNOSIS — R69 Illness, unspecified: Secondary | ICD-10-CM

## 2015-12-29 LAB — CBC
HCT: 31.1 % — ABNORMAL LOW (ref 40.0–52.0)
HEMOGLOBIN: 9.6 g/dL — AB (ref 13.0–18.0)
MCH: 28 pg (ref 26.0–34.0)
MCHC: 30.9 g/dL — ABNORMAL LOW (ref 32.0–36.0)
MCV: 90.6 fL (ref 80.0–100.0)
Platelets: 408 10*3/uL (ref 150–440)
RBC: 3.44 MIL/uL — AB (ref 4.40–5.90)
RDW: 18.3 % — ABNORMAL HIGH (ref 11.5–14.5)
WBC: 15.7 10*3/uL — ABNORMAL HIGH (ref 3.8–10.6)

## 2015-12-29 LAB — BASIC METABOLIC PANEL
Anion gap: 6 (ref 5–15)
BUN: 33 mg/dL — AB (ref 6–20)
CHLORIDE: 114 mmol/L — AB (ref 101–111)
CO2: 27 mmol/L (ref 22–32)
Calcium: 8 mg/dL — ABNORMAL LOW (ref 8.9–10.3)
Creatinine, Ser: 1.24 mg/dL (ref 0.61–1.24)
GFR calc Af Amer: >60 mL/min (ref >60)
GFR calc non Af Amer: 57 mL/min — ABNORMAL LOW (ref >60)
Glucose, Bld: 317 mg/dL — ABNORMAL HIGH (ref 65–99)
POTASSIUM: 4.3 mmol/L (ref 3.5–5.1)
SODIUM: 147 mmol/L — AB (ref 135–145)

## 2015-12-29 LAB — GLUCOSE, CAPILLARY
GLUCOSE-CAPILLARY: 196 mg/dL — AB (ref 65–99)
GLUCOSE-CAPILLARY: 233 mg/dL — AB (ref 65–99)
GLUCOSE-CAPILLARY: 248 mg/dL — AB (ref 65–99)
GLUCOSE-CAPILLARY: 236 mg/dL — AB (ref 65–99)
GLUCOSE-CAPILLARY: 218 mg/dL — AB (ref 65–99)
GLUCOSE-CAPILLARY: 182 mg/dL — AB (ref 65–99)
GLUCOSE-CAPILLARY: 170 mg/dL — AB (ref 65–99)
GLUCOSE-CAPILLARY: 140 mg/dL — AB (ref 65–99)
GLUCOSE-CAPILLARY: 95 mg/dL (ref 65–99)
GLUCOSE-CAPILLARY: 120 mg/dL — AB (ref 65–99)
Glucose-Capillary: 113 mg/dL — ABNORMAL HIGH (ref 65–99)
Glucose-Capillary: 115 mg/dL — ABNORMAL HIGH (ref 65–99)
Glucose-Capillary: 144 mg/dL — ABNORMAL HIGH (ref 65–99)
Glucose-Capillary: 238 mg/dL — ABNORMAL HIGH (ref 65–99)
Glucose-Capillary: 282 mg/dL — ABNORMAL HIGH (ref 65–99)
Glucose-Capillary: 308 mg/dL — ABNORMAL HIGH (ref 65–99)

## 2015-12-29 LAB — TYPE AND SCREEN
ABO/RH(D): B POS
ANTIBODY SCREEN: NEGATIVE
UNIT DIVISION: 0

## 2015-12-29 LAB — PHOSPHORUS: Phosphorus: 3.3 mg/dL (ref 2.5–4.6)

## 2015-12-29 LAB — MAGNESIUM: MAGNESIUM: 2 mg/dL (ref 1.7–2.4)

## 2015-12-29 LAB — HEPARIN LEVEL (UNFRACTIONATED)
HEPARIN UNFRACTIONATED: 0.17 [IU]/mL — AB (ref 0.30–0.70)
HEPARIN UNFRACTIONATED: 0.21 [IU]/mL — AB (ref 0.30–0.70)

## 2015-12-29 MED ORDER — SODIUM CHLORIDE 0.9 % IV SOLN
INTRAVENOUS | Status: AC
  Administered 2015-12-29: 1.8 [IU]/h via INTRAVENOUS
  Administered 2015-12-29: 5.4 [IU]/h via INTRAVENOUS
  Administered 2015-12-30: 9 [IU]/h via INTRAVENOUS
  Administered 2015-12-30: 4.1 [IU]/h via INTRAVENOUS
  Administered 2016-01-01: 16:00:00 via INTRAVENOUS
  Administered 2016-01-02: 2.9 [IU]/h via INTRAVENOUS
  Filled 2015-12-29 (×4): qty 2.5

## 2015-12-29 MED ORDER — HEPARIN BOLUS VIA INFUSION
1800.0000 [IU] | Freq: Once | INTRAVENOUS | Status: AC
  Administered 2015-12-29: 1800 [IU] via INTRAVENOUS
  Filled 2015-12-29: qty 1800

## 2015-12-29 MED ORDER — TRACE MINERALS CR-CU-MN-SE-ZN 10-1000-500-60 MCG/ML IV SOLN
INTRAVENOUS | Status: AC
  Administered 2015-12-29: 18:00:00 via INTRAVENOUS
  Filled 2015-12-29: qty 1992

## 2015-12-29 MED ORDER — COLLAGENASE 250 UNIT/GM EX OINT
TOPICAL_OINTMENT | Freq: Every day | CUTANEOUS | Status: DC
  Administered 2015-12-29 – 2016-01-05 (×8): via TOPICAL
  Administered 2016-01-06: 1 via TOPICAL
  Administered 2016-01-07: 15:00:00 via TOPICAL
  Filled 2015-12-29 (×2): qty 30

## 2015-12-29 NOTE — Progress Notes (Signed)
Patient has LVEF 41 %, on echo and hemodynamically stable, advise proceeding with trachesotomy.

## 2015-12-29 NOTE — Progress Notes (Addendum)
Inpatient Diabetes Program Recommendations  AACE/ADA: New Consensus Statement on Inpatient Glycemic Control (2015)  Target Ranges:  Prepandial:   less than 140 mg/dL      Peak postprandial:   less than 180 mg/dL (1-2 hours)      Critically ill patients:  140 - 180 mg/dL  Results for Stephen Camacho, Stephen Camacho (MRN [id number]) as of 12/29/2015 10:26  Ref. Range 12/28/2015 07:23 12/28/2015 11:21 12/28/2015 15:52 12/28/2015 19:44 12/28/2015 23:51 12/29/2015 03:44 12/29/2015 07:19  Glucose-Capillary Latest Ref Range: 65-99 mg/dL 309 (H) 311 (H) 208 (H) 196 (H) 245 (H) 308 (H) 233 (H)   Review of Glycemic Control  Current orders for Inpatient glycemic control: Lantus 20 units daily, Novolog 0-20 units Q4H  Inpatient Diabetes Program Recommendations: Insulin - Basal: Glucose has ranged from 196-311 mg/dl over the past 24 hours. Please consider increasing Lantus to 25 units daily (based on 60.7 kg x 0.4 units). Insulin - TPN Coverage: Patient is ordered TPN @ 83 ml/hr. Please order Novolog 5 units Q4H for TPN coverage. If TPN is stopped or held then Novolog TPN coverage would also be stopped or held. Insulin-Correction: Patient is currently ordered Novolog correction from regular glycemic control order set. Please consider ordering ICU Glycemic control order set to improve glycemic control.  Thanks, Barnie Alderman, RN, MSN, CDE Diabetes Coordinator Inpatient Diabetes Program [phone] (Team Pager from [place] to [place]) [phone] ([facility] office) [phone] [facility] office) [phone] [facility] office)

## 2015-12-29 NOTE — Progress Notes (Signed)
Consent for tracheostomy obtained from son.  Plan for tomorrow. TObe NPO p MN.  Heparingtt to be held after MN. Seen by anesthesia and cleared for OR by cardiology

## 2015-12-29 NOTE — Progress Notes (Signed)
Nutrition Follow-up   INTERVENTION:   Coordination of Care: Per discussion in rounds this am, Pharmacy to add insulin drip for better glucose control while pt on TPN. Recommend continuing current TPN regimen as ordered. Will continue to follow and assess.    NUTRITION DIAGNOSIS:   Inadequate oral intake related to inability to eat as evidenced by NPO status.  GOAL:   Patient will meet greater than or equal to 90% of their needs  MONITOR:    (Energy Intake, Electrolyte and renal Profile, Anthropometrics, Digestive System, Glucose Profile, Pulmonary Profile)  REASON FOR ASSESSMENT:   Consult New TPN/TNA  ASSESSMENT:   Pt s/p MI 2 days ago. Pt remains intubated on the vent. Possible trach.  Diet Order:  Diet NPO time specified .TPN (CLINIMIX-E) Adult .TPN (CLINIMIX-E) Adult  PN: 5%AA/15%Dextrose at 18m/hr    Gastrointestinal Profile: soft, hypoactive BS, nontender abdomen per documentation Last BM: 3/3 smear   Scheduled Medications:  . anidulafungin  100 mg Intravenous Q24H  . antiseptic oral rinse  7 mL Mouth Rinse 10 times per day  . chlorhexidine gluconate  15 mL Mouth Rinse BID  . collagenase   Topical Daily  . pantoprazole (PROTONIX) IV  40 mg Intravenous Q24H  . piperacillin-tazobactam (ZOSYN)  IV  3.375 g Intravenous 3 times per day  . vancomycin  1,000 mg Intravenous Q18H    Continuous Medications:  . .Marland KitchenPN (CLINIMIX-E) Adult 83 mL/hr at 12/29/15 0700  . .Marland KitchenPN (CLINIMIX-E) Adult    . amiodarone 30 mg/hr (12/29/15 0700)  . fentaNYL 10 mcg/ml infusion 250 mcg/hr (12/29/15 0815)  . heparin 1,050 Units/hr (12/29/15 0953)  . insulin (NOVOLIN-R) infusion 1.8 Units/hr (12/29/15 1200)  . norepinephrine (LEVOPHED) Adult infusion Stopped (12/26/15 2151)     Electrolyte/Renal Profile and Glucose Profile:   Recent Labs Lab 12/27/15 0419 12/28/15 0012 12/28/15 0519 12/28/15 1729 12/29/15 0455  NA 148* 145 149*  --  147*  K 3.6 3.2* 3.5 3.3* 4.3  CL 113*  109 114*  --  114*  CO2 '28 24 30  '$ --  27  BUN 32* 29* 34*  --  33*  CREATININE 1.24 1.26* 1.22  --  1.24  CALCIUM 7.9* 7.4* 7.8*  --  8.0*  MG 2.0  --  2.3  --  2.0  PHOS 3.2  --  3.7  --  3.3  GLUCOSE 326* 600* 416*  --  317*   Protein Profile:  Recent Labs Lab 12/26/15 1109 12/27/15 0419 12/28/15 0012  ALBUMIN 1.8* 1.9* 1.6*     Weight Trend since Admission: Filed Weights   12/27/15 0432 12/28/15 0500 12/29/15 0409  Weight: 128 lb 1.4 oz (58.1 kg) 134 lb 7.7 oz (61 kg) 133 lb 13.1 oz (60.7 kg)     Skin:   (stage II pressure ulcer on sacrum and lip)   Estimated Nutritional Needs:   Kcal:  1371 kcals (Ve: 8.7, Tmax: 38.4) using wt of 59 kg, length of 44 inches  Protein:  89-118 g (1.5-2.0 g/kg)   Fluid:  1770-2065 mL (30-35 ml/kg)   EDUCATION NEEDS:   No education needs identified at this time   [place] RD, LDN Pager [phone]/On-Call Pager [phone]

## 2015-12-29 NOTE — Progress Notes (Addendum)
[facility]  Daily Progress Note   Subjective    The patient has again sustained a cardiac arrest this weekend. He does open eyes but does not track or follow commands  Objective Filed Vitals:   12/29/15 1500 12/29/15 1600 12/29/15 1700 12/29/15 1800  BP: 141/84 141/83 136/79 126/79  Pulse:      Temp: 99.7 F (37.6 C) 99.7 F (37.6 C) 99.9 F (37.7 C) 99.7 F (37.6 C)  TempSrc:      Resp: '15 14 18 14  '$ Height:      Weight:      SpO2: 98% 100% 100% 100%    Intake/Output Summary (Last 24 hours) at 12/29/15 1812 Last data filed at 12/29/15 1800  Gross per 24 hour  Intake 3199.33 ml  Output   2000 ml  Net 1199.33 ml    PULM  Normal effort , no use of accessory muscles CV  No JVD, RRR Abd      No distended, nontender VASC  left above-knee amputation stump clean dry and intact  Laboratory CBC    Component Value Date/Time   WBC 15.7* 12/29/2015 0455   WBC 4.9 10/25/2014 1331   HGB 9.6* 12/29/2015 0455   HGB 12.1* 10/25/2014 1331   HCT 31.1* 12/29/2015 0455   HCT 38.1* 10/25/2014 1331   PLT 408 12/29/2015 0455   PLT 151 10/25/2014 1331    BMET    Component Value Date/Time   NA 147* 12/29/2015 0455   NA 138 10/25/2014 1331   K 4.3 12/29/2015 0455   K 3.9 10/25/2014 1331   CL 114* 12/29/2015 0455   CL 109* 10/25/2014 1331   CO2 27 12/29/2015 0455   CO2 21 10/25/2014 1331   GLUCOSE 317* 12/29/2015 0455   GLUCOSE 232* 10/25/2014 1331   BUN 33* 12/29/2015 0455   BUN 17 10/25/2014 1331   CREATININE 1.24 12/29/2015 0455   CREATININE 1.11 10/25/2014 1331   CALCIUM 8.0* 12/29/2015 0455   CALCIUM 8.7 10/25/2014 1331   GFRNONAA 57* 12/29/2015 0455   GFRNONAA >60 10/25/2014 1331   GFRNONAA 52* 07/08/2014 1051   GFRAA >60 12/29/2015 0455   GFRAA >60 10/25/2014 1331   GFRAA >60 07/08/2014 1051    Assessment/Planning:    Family continues to wish everything be done patient is therefore undergoing tracheostomy tomorrow and later this  week we will address the issue of enteral feeds and/or a G-tube. His overall continued condition is grave his multiple cardiac events and his elevated white count suggest that his prognosis is quite poor family has been informed of this.    Lamara Brecht, [name]  12/29/2015, 6:12 PM

## 2015-12-29 NOTE — Progress Notes (Signed)
SUBJECTIVE: Patient remains intubated but hemodynamically stable   Filed Vitals:   12/29/15 0600 12/29/15 0700 12/29/15 0800 12/29/15 0820  BP: 132/81 125/80 126/78   Pulse:      Temp:  100.8 F (38.2 C) 100.4 F (38 C)   TempSrc:      Resp: '17 14 15   '$ Height:      Weight:      SpO2: 100% 96% 99% 98%    Intake/Output Summary (Last 24 hours) at 12/29/15 0911 Last data filed at 12/29/15 0831  Gross per 24 hour  Intake 3666.61 ml  Output   2050 ml  Net 1616.61 ml    LABS: Basic Metabolic Panel:  Recent Labs  12/28/15 0519 12/28/15 1729 12/29/15 0455  NA 149*  --  147*  K 3.5 3.3* 4.3  CL 114*  --  114*  CO2 30  --  27  GLUCOSE 416*  --  317*  BUN 34*  --  33*  CREATININE 1.22  --  1.24  CALCIUM 7.8*  --  8.0*  MG 2.3  --  2.0  PHOS 3.7  --  3.3   Liver Function Tests:  Recent Labs  12/27/15 0419 12/28/15 0012  AST 20 45*  ALT 26 29  ALKPHOS 111 96  BILITOT 0.8 0.7  PROT 6.4* 5.7*  ALBUMIN 1.9* 1.6*   No results for input(s): LIPASE, AMYLASE in the last 72 hours. CBC:  Recent Labs  12/28/15 1732 12/29/15 0455  WBC 16.6* 15.7*  HGB 9.2* 9.6*  HCT 29.5* 31.1*  MCV 90.5 90.6  PLT 409 408   Cardiac Enzymes:  Recent Labs  12/28/15 0012  TROPONINI 0.27*   BNP: Invalid input(s): POCBNP D-Dimer: No results for input(s): DDIMER in the last 72 hours. Hemoglobin A1C: No results for input(s): HGBA1C in the last 72 hours. Fasting Lipid Panel:  Recent Labs  12/27/15 0419  TRIG 139   Thyroid Function Tests: No results for input(s): TSH, T4TOTAL, T3FREE, THYROIDAB in the last 72 hours.  Invalid input(s): FREET3 Anemia Panel: No results for input(s): VITAMINB12, FOLATE, FERRITIN, TIBC, IRON, RETICCTPCT in the last 72 hours.   PHYSICAL EXAM General: Well developed, well nourished, in no acute distress HEENT:  Normocephalic and atramatic Neck:  No JVD.  Lungs: Clear bilaterally to auscultation and percussion. Heart: HRRR . Normal S1 and  S2 without gallops or murmurs.  Abdomen: Bowel sounds are positive, abdomen soft and non-tender  Msk:  Back normal, normal gait. Normal strength and tone for age. Extremities: No clubbing, cyanosis or edema.   Neuro: Alert and oriented X 3. Psych:  Good affect, responds appropriately The top TELEMETRY: Sinus rhythm at 80 bpm  ASSESSMENT AND PLAN: Patient had cardiac arrest secondary to probably coronary artery disease. Echocardiogram showed ejection fraction 41% with septal hypokinesis. Patient is not a candidate for revascularization with above-knee amputation bilaterally. Advise conservative treatment and advise making the patient DO NOT RESUSCITATE.  Active Problems:   Ischemic leg   Acute respiratory failure (HCC)   Ischemia of lower extremity   Altered mental status   Hematoma of neck   Occlusion of arterial bypass graft (HCC)   NSTEMI (non-ST elevated myocardial infarction) (HCC)   Severe sepsis (HCC)   Urinary retention   Aspiration into respiratory tract   Encounter for intubation   Pressure ulcer   Abdominal distension   Free intraperitoneal air   Ileus ([place])   Stroke (HCC)    Dionisio David, MD, [facility] 12/29/2015 9:11  AM

## 2015-12-29 NOTE — Progress Notes (Signed)
Pharmacy Anti-infective Note  Stephen Camacho is a 70 y.o. male admitted on 12/05/2015 with pneumonia.  Pharmacy has been consulted for vancomycin, Zosyn, and anidulafungin dosing.   Plan: Will continue vancomycin 1 g IV q18 hours. Trough scheduled with the 4th dose of the new regimen since dosage change.    Zosyn: Continue Zosyn 3.375 g EI q 8 hours.   Anidulafungin '100mg'$  IV Q24hr.    Pharmacy will continue to monitor and adjust per consult.   Height: '6\' 4"'$  (193 cm) Weight: 133 lb 13.1 oz (60.7 kg) IBW/kg (Calculated) : 86.8  Temp (24hrs), Avg:100.1 F (37.8 C), Min:99.7 F (37.6 C), Max:100.9 F (38.3 C)   Recent Labs Lab 12/26/15 0446 12/26/15 1109 12/27/15 0419 12/28/15 0012 12/28/15 0300 12/28/15 0519 12/28/15 1130 12/28/15 1732 12/29/15 0455  WBC  --   --  18.5* 16.7*  --  17.8*  --  16.6* 15.7*  CREATININE  --  1.34* 1.24 1.26*  --  1.22  --   --  1.24  LATICACIDVEN  --   --   --  6.0* 2.2*  --   --   --   --   VANCOTROUGH 22*  --   --   --   --   --  12  --   --     Estimated Creatinine Clearance: 47.6 mL/min (by C-G formula based on Cr of 1.24).    No Known Allergies  Antimicrobials this admission: vancomycin 02/22 >> 02/23 ceftazidime 02/22 >> 02/24 Metronidazole 02/23 >> 02/24 Zosyn 02/24 >> Vancomycin 02/26 >> Anidulafungin 02/27>>  Dose adjustments this admission:   Microbiology results: 02/23 BCx: negative x 2 02/23 UCx: negative  02/21 Sputum: Candida albicans  02/22 MRSA PCR: negative 02/23 TA Candida albicans 02/26 TA moderate growth Candida albicans  Thank you for allowing pharmacy to be a part of this patient's care.  [name] 12/29/2015 11:27 AM

## 2015-12-29 NOTE — Progress Notes (Signed)
[facility] NAME: Stephen Camacho    MR#:  [id number]  DATE OF BIRTH:  15-Apr-1946  SUBJECTIVE:   Patient here due to a prolonged hospital course status post left femoropopliteal bypass status post thrombolysis, thrombectomy and angioplasty with residual thrombosis, status post sepsis from left lower extremity gangrene end STEMI, ischemic respiratory failure due to pneumonia, perforated viscus status post PEG placement, recent episode of PE a cardiac arrest. Currently critically ill and remains on Amio gtt.    REVIEW OF SYSTEMS:    Review of Systems  Unable to perform ROS: intubated    Nutrition: TPN Tolerating Diet: no due to perforated viscus Tolerating PT: AWait eval.    DRUG ALLERGIES:  No Known Allergies  VITALS:  Blood pressure 125/78, pulse 102, temperature 100.2 F (37.9 C), temperature source Rectal, resp. rate 14, height '6\' 4"'$  (1.93 m), weight 60.7 kg (133 lb 13.1 oz), SpO2 100 %.  PHYSICAL EXAMINATION:   Physical Exam  GENERAL:  70 y.o.-year-old critically ill patient lying in the bed in NAD.  EYES: Pupils equal, round, reactive to light. No scleral icterus.  HEENT: Head atraumatic, normocephalic. ET and OG tube in place.   NECK:  Supple, no jugular venous distention. No thyroid enlargement, no tenderness.  LUNGS: Normal breath sounds bilaterally, no wheezing, rales, rhonchi. No use of accessory muscles of respiration.  CARDIOVASCULAR: S1, S2 normal. No murmurs, rubs, or gallops.  ABDOMEN: Soft, nondistended. Hypoactive BS. No organomegaly or mass.  EXTREMITIES: No cyanosis, clubbing or edema b/l.   B/L AKA.  Left AKA with staples in place and wound clean. NEUROLOGIC: Sedated & intubated. Does not follow any commands.   PSYCHIATRIC: Sedated & intubated.  Does not follow any commands.  SKIN: No obvious rash, lesion, or ulcer.    LABORATORY PANEL:   CBC  Recent Labs Lab 12/29/15 0455  WBC 15.7*   HGB 9.6*  HCT 31.1*  PLT 408   ------------------------------------------------------------------------------------------------------------------  Chemistries   Recent Labs Lab 12/28/15 0012  12/29/15 0455  NA 145  < > 147*  K 3.2*  < > 4.3  CL 109  < > 114*  CO2 24  < > 27  GLUCOSE 600*  < > 317*  BUN 29*  < > 33*  CREATININE 1.26*  < > 1.24  CALCIUM 7.4*  < > 8.0*  MG  --   < > 2.0  AST 45*  --   --   ALT 29  --   --   ALKPHOS 96  --   --   BILITOT 0.7  --   --   < > = values in this interval not displayed. ------------------------------------------------------------------------------------------------------------------  Cardiac Enzymes  Recent Labs Lab 12/28/15 0012  TROPONINI 0.27*   ------------------------------------------------------------------------------------------------------------------  RADIOLOGY:  Dg Chest 1 View  12/28/2015  CLINICAL DATA:  70 year old male with cardiac arrest. History of right lobectomy. EXAM: CHEST 1 VIEW COMPARISON:  Radiograph dated 12/27/2015 FINDINGS: Endotracheal tube above the carina, an enteric tube coursing towards the left upper abdomen with tip extending beyond the inferior margin of the image. Left-sided PICC with tip over right cardiac border similar to prior study. The lungs are hyperexpanded with evidence of mild emphysema. Bilateral upper lobe surgical sutures noted. Patchy area of airspace opacity noted in the left upper lung field stable or slightly progressed compared the prior study. The previously seen bibasilar airspace densities and improved. There is no pleural effusion. No pneumothorax. There  is moderate cardiomegaly. Median sternotomy wires and CABG vascular clips noted. No acute osseous pathology. IMPRESSION: Persistent area of hazy airspace density in the left upper lung field. Interval improvement of the airspace densities seen at the lung bases. Electronically Signed   By: Anner Crete M.D.   On: 12/28/2015  01:03     ASSESSMENT AND PLAN:   70 year old male with past history of peripheral vascular disease, hypertension, hyperlipidemia, diabetes, history of seizures, history of prostate cancer, CVA who is admitted to the hospital for a left femoral pop bypass graft status post thrombolysis/thrombectomy/angioplasty and postoperatively has had complications due to left lower extremity gangrene with sepsis, acute respiratory failure with hypoxia due to pneumonia, and STEMI, perforated viscus status post PEG placement, and recent PEA cardiac arrest.  #1 PEA cardiac arrest-as per Cards due to underlying CAD/MI.  - Echo showing EF of 41% w/ septal hypokinesis.  No a candidate for urgent intervention as he is critically ill. - conservative treatment for now.  Cont. Heparin gtt, Amio gtt.   #2 Acute Resp Failure - cont. Care as per Pulmonary.  - no ready to be weaned yet.  - due to pneumonia and cont. Vanco, Zosyn.   #3 Perforated viscus post PEG tube placement - no plans for surgical intervention as he is critically ill - cont. TPN - cont. Empiric abx.   #4. NSETMI - cont. Medical management due to critical illness.   #5. S/p Fem-pop bypass w/ thrombolysis - cont. Care as per Vascular.   #6 COPD - PRN duonebs.   Palliative CAre consult to discuss goals of care as pt's prognosis is very poor.    All the records are reviewed and case discussed with Care Management/Social Workerr. Management plans discussed with the patient, family and they are in agreement.  CODE STATUS: FULL  DVT Prophylaxis: heparin gtt  TOTAL TIME TAKING CARE OF THIS PATIENT: 30 minutes.   POSSIBLE D/C  unclear, DEPENDING ON CLINICAL CONDITION and hospital course.    Henreitta Leber M.D on 12/29/2015 at 11:43 AM  Between 7am to 6pm - Pager - [phone]  After 6pm go to [email or web address] - password [email or web address] [facility]  Office  [phone]  CC: Primary care physician; Pcp Not In System

## 2015-12-29 NOTE — Progress Notes (Signed)
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 Now with acute respiratory failure, intubated due to pneumonia. Underwent a left AKA   SUBJECTIVE:  Episode of vent dyssynchrony, 3 beat run of VT and bradycardia with HR in the 30s and sequent asystole arrest last night. Patient was given one dose of atropine for HR in the 30s and then went into a witnessed asystole arrest. CPR was initiated and sustained for a total of 12 minutes with ROSC.  He was given epi pushes and defibrillated x 1.  Trach planned for today or tomorrow  Zosyn/Vanc/antfungal therapy for complication from PEG tube placement s/p bowel/stomach perforation and candida in sputum   VITAL SIGNS: BP 132/81 mmHg  Pulse 102  Temp(Src) 100.8 F (38.2 C) (Rectal)  Resp 17  Ht '6\' 4"'$  (1.93 m)  Wt 133 lb 13.1 oz (60.7 kg)  BMI 16.30 kg/m2  SpO2 100%  HEMODYNAMICS:    VENTILATOR SETTINGS: Vent Mode:  [-] PRVC FiO2 (%):  [30 %] 30 % Set Rate:  [15 bmp] 15 bmp Vt Set:  [500 mL] 500 mL PEEP:  [5 cmH20] 5 cmH20 Plateau Pressure:  [22 cmH20] 22 cmH20  INTAKE / OUTPUT: I/O last 3 completed shifts: In: 4768.4 [I.V.:1361; Blood:320; IV Piggyback:300] Out: 3025 [Urine:3025]  PHYSICAL EXAMINATION: General: Chronically ill appearing Neuro:Awake on the vent, not tracking and not following commands HEENT: NCAT, ETT in place Cardiovascular: RRR, S1/S2, no MRG Lungs: Bilateral airflow, no wheezes Abdomen: Soft, NT, +BS Ext: Bilateral AKA, Left stump clean and dry  LABS:  BMET  Recent Labs Lab 12/28/15 0012 12/28/15 0519  12/28/15 1729 12/29/15 0455  NA 145 149*  --  147*  K 3.2* 3.5 3.3* 4.3  CL 109 114*  --  114*  CO2 24 30  --  27  BUN 29* 34*  --  33*  CREATININE 1.26* 1.22  --  1.24  GLUCOSE 600* 416*  --  317*    Electrolytes  Recent Labs Lab 12/27/15 0419 12/28/15 0012 12/28/15 0519 12/29/15 0455  CALCIUM 7.9* 7.4* 7.8* 8.0*  MG 2.0  --  2.3 2.0  PHOS 3.2  --  3.7 3.3    CBC  Recent Labs Lab 12/28/15 0519 12/28/15 1732 12/29/15 0455  WBC 17.8* 16.6* 15.7*  HGB 7.3* 9.2* 9.6*  HCT 24.0* 29.5* 31.1*  PLT 392 409 408    Coag's  Recent Labs Lab 12/28/15 1156  APTT 38*  INR 1.35    Sepsis Markers  Recent Labs Lab 12/28/15 0012 12/28/15 0300  LATICACIDVEN 6.0* 2.2*    ABG  Recent Labs Lab 12/22/15 0850 12/25/15 1000 12/28/15 0416  PHART 7.40 7.52* 7.41  PCO2ART 40 32 47  PO2ART 67* 124* 175*    Liver Enzymes  Recent Labs Lab 12/26/15 1109 12/27/15 0419 12/28/15 0012  AST 20 20 45*  ALT '31 26 29  '$ ALKPHOS 101 111 96  BILITOT 1.1 0.8 0.7  ALBUMIN 1.8* 1.9* 1.6*    Cardiac Enzymes  Recent Labs Lab 12/28/15 0012  TROPONINI 0.27*    Glucose  Recent Labs Lab 12/28/15 1121 12/28/15 1552 12/28/15 1944 12/28/15 2351 12/29/15  0344 12/29/15 0719  GLUCAP 311* 208* 196* 245* 308* 233*   No results found. EVENTS/DATA: 02/10 >> LLE angiogram, thrombolysis, thrombectomy, angioplasty 02/11 Intubated for suspected PNA 02/11 elevated trop I. Pk trop I 62.6 on 02/20 02/12 CT head: New nonhemorrhagic infarcts involving the right PCA territory and bilateral cerebellum, left greater than right 02/17 L AKA 02/18>extubated 02/21 TTE: LVEF 55-60% 02/21 RUE [place]: no DVT 02/21>Re-intubated 02/22 >CT head: Interval evolution of bilateral cerebellar and right PCA territory infarcts 02/27-remains intubated,discussion with family-proceed with trach and PEG 12/24/2015: Attempted EGD and PEG placement resulting in complication of bowel  perforation 03/04-Asystole arrest; CPR x 12 minutes with ROSC  INDWELLING DEVICES:: ETT 02/11 >> 02/18, 02/21 >>  R IJ CVL 02/10 >> removed PICC line RT  MICRO DATA: MRSA PCR 02/13 >> NEG Urine 02/12 >> NEG Resp 02/11 >> MSSA C diff 02/16 >> NEG Blood 02/12 >> NEG Urine 02/19 >> NEG C diff 02/21 >> NEG Cath tip 02/19 >> NEG Resp 02/18 >> light growth candida Urine 02/21 >> NEG MRSA PCR 02/22 >> NEG Respiratory 02/26>>  ANTIMICROBIALS:  Vanc 02/11 >> 02/23 Pip-tazo 02/11 >> 02/20 Ceftaz 02/21 >> 02/24 Metronidazole 02/21 >> 02/24 Pip-tazo 02/24 >>  Vancomycin 02/25>>  ASSESSMENT / PLAN: 70 YO male with 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 Now with acute respiratory failure, intubated due to pneumonia. Underwent a left AKA 02/17, failed extubation and prolonged ventilator dependence, bowel perforation s/p peg tube placement attempt, and cardiac arrest. Clinical presentation consistent with anoxic brain injury. Given his multiple co-morbidities and frail status, his overall prognosis is very poor.  PULMONARY A: Prolonged VDRF Failed extubation attempt Bilateral pulmonary infiltrates and fever- c/w  PNA P:   -Cont vent support -settings reviewed -Cont vent bundle -Daily SBT if/when meets criteria -Antibiotics as above  -CXR in am -Trach per ENT  CARDIOVASCULAR A:  Cardiac arrest Recurrent NSTEMI PAF, NSR presently NSVT Acute CHF with proBNP>45000; Echo at bedside revealed ejection fraction 41% with moderate to severe hypokinesis of the septum and inferior wall moderate mitral regurgitation and severe pulmonary hypertension with PA systolic pressure 67 mmHg S/p cardiac arrest-prognosis is poor P:  -Seen by cardiology post-code, appreciate recs -Heparin gtt  per cardilogy -MAP goal > 65 mmHg -Cont ASA -Continue amiodarone  -Hemodynamics per ICU protocol  RENAL A:   AKI, nonoliguric-improved  Very poor candidate for HD P:   -Monitor BMET intermittently -Monitor I/Os -Correct electrolytes as indicated -Continue free water flushes  GASTROINTESTINAL A:   No issues P:   -SUP: enteral PPI -Now with bowel/stomach perforation -Awaiting GI and surgery recs -Continue TPN  HEMATOLOGIC A:   ICU acquired anemia without acute blood loss P:  -DVT px: SQ heparin -Monitor CBC intermittently -Transfuse per usual guidelines   INFECTIOUS A:   Severe sepsis-persistent leukocytosis; no fever in 24hrs LLE gangrene Aspiration PNA P:   -Vanco and zosyn, antifungal therapy - Repeat cultures if febrile -Tylenol and motrin prn for fever >103 - Follow up ID recs  ENDOCRINE A:   Type 2 DM-Persistent hyperglycemia P:   -Off insulin infusion -Continue blood gluocose monitoring per ICU protocol -Change sliding scale to resistant  NEUROLOGIC A:   Multifocal CVAs, acute Acute encewphalopathy H/O seizures - not on chronic AEDs PTA P:   RASS goal: -1, -2 -Fentanyl gtt and prn  versed for vent sedation  I have personally obtained a history, examined the patient, evaluated Pertinent laboratory and RadioGraphic/imaging results, and  formulated the assessment and plan   The Patient requires high complexity decision making for assessment and support, frequent evaluation and titration of therapies, application of advanced monitoring technologies and extensive interpretation of multiple databases. Critical Care Time devoted to patient care services described in this note is 45 minutes.   Overall, patient is critically ill, prognosis is guarded.  Patient with Multiorgan failure and at high risk for cardiac arrest and death.  Very poor chance of meaningful recovery  Stephen Camacho, M.D.  [facility]  Medical  Director [place] Director [place] Department

## 2015-12-29 NOTE — Progress Notes (Signed)
Pt remains on vent.vent settings as charted. Lung sound diminished. Foley in place. [id number]. SR on CM. Bilateral knee incisions clear and dry.sedated with fentanyl. Resting in b ed without any distress.

## 2015-12-29 NOTE — Progress Notes (Signed)
ANTICOAGULATION CONSULT NOTE - Initial Consult  Pharmacy Consult for Heparin Indication: chest pain/ACS  No Known Allergies  Patient Measurements: Height: '6\' 4"'$  (193 cm) Weight: 133 lb 13.1 oz (60.7 kg) IBW/kg (Calculated) : 86.8 Heparin Dosing Weight: 61 kg  Vital Signs: Temp: 100.4 F (38 C) (03/06 0800) BP: 126/78 mmHg (03/06 0800)  Labs:  Recent Labs  12/28/15 0012 12/28/15 0519 12/28/15 1156 12/28/15 1732 12/28/15 2219 12/29/15 0455 12/29/15 0802  HGB 7.3* 7.3*  --  9.2*  --  9.6*  --   HCT 25.5* 24.0*  --  29.5*  --  31.1*  --   PLT 428 392  --  409  --  408  --   APTT  --   --  38*  --   --   --   --   LABPROT  --   --  16.8*  --   --   --   --   INR  --   --  1.35  --   --   --   --   HEPARINUNFRC  --   --   --   --  0.15*  --  <0.10*  CREATININE 1.26* 1.22  --   --   --  1.24  --   TROPONINI 0.27*  --   --   --   --   --   --     Estimated Creatinine Clearance: 47.6 mL/min (by C-G formula based on Cr of 1.24).   Assessment: 70 yo male with hx of CAD s/p cardiac arrest to start on heparin drip  Goal of Therapy:  Heparin level 0.3-0.7 units/ml Monitor platelets by anticoagulation protocol: Yes   Plan:  Heparin level remains below goal. Heparin drip has not been paused per RN. Will bolus heparin 1800 units iv once and increase infusion to 1050 units/hr and recheck another HL in 6 hours.   Ulice Dash, PharmD Clinical Pharmacist   12/29/2015,8:56 AM

## 2015-12-29 NOTE — Progress Notes (Signed)
Dr. Humphrey Rolls spoke with this RN via phone and gave verbal order that it is okay to proceed at this time with anesthesia for tracheostomy placement. RN notified Dr. Andree Elk.

## 2015-12-29 NOTE — Plan of Care (Signed)
Problem: Safety: Goal: Ability to remain free from injury will improve Outcome: Progressing No injury  Problem: Pain Managment: Goal: General experience of comfort will improve Outcome: Progressing Appears comfortable on fentanyl gtt  Problem: Physical Regulation: Goal: Ability to maintain clinical measurements within normal limits will improve Outcome: Progressing VSS.  Sats good on vent. NSR with occassional PVC Goal: Will remain free from infection Outcome: Progressing Temp 99.7-100.9 per foley core temp. WBC 15.7.  On zosyn and vancomycin.  ETT secretions clear to pink tinge  Problem: Skin Integrity: Goal: Risk for impaired skin integrity will decrease Outcome: Progressing Seen by wound care nurse.  Santyl added to sacral wound bed. Left AKA incision with staples and no sign of infection  Problem: Tissue Perfusion: Goal: Risk factors for ineffective tissue perfusion will decrease Outcome: Progressing On heparin gtt.  Problem: Activity: Goal: Risk for activity intolerance will decrease Outcome: Not Progressing Remains on bedrest.  Turning and contious lateral rotation on  Problem: Fluid Volume: Goal: Ability to maintain a balanced intake and output will improve Outcome: Progressing Good uop  Problem: Nutrition: Goal: Adequate nutrition will be maintained Outcome: Progressing TPN at 81m/hr. OGT clamped and not in use per MDorder  Problem: Bowel/Gastric: Goal: Will not experience complications related to bowel motility Outcome: Not Progressing No stool.  BS hypoactive.  Abdomen soft and slightly distended  Problem: Consults Goal: Diabetes Guidelines if Diabetic/Glucose > 140 If diabetic or lab glucose is > 140 mg/dl - Initiate Diabetes/Hyperglycemia Guidelines & Document Interventions  Outcome: Not Progressing Insulin gtt added today to keep BS in range

## 2015-12-29 NOTE — Progress Notes (Signed)
GI Note:  Follow up plain films show no extrav of contrast and no further pneumoperitoneum.   Interventional radiology will attempt G-tube place on Wed or Thursday if tracheostomy goes well.   Would require PO contrast night before g-tube placement to highlight the transverse colon.

## 2015-12-29 NOTE — Progress Notes (Signed)
ANTICOAGULATION CONSULT NOTE - Initial Consult  Pharmacy Consult for Heparin Indication: chest pain/ACS  No Known Allergies  Patient Measurements: Height: '6\' 4"'$  (193 cm) Weight: 133 lb 13.1 oz (60.7 kg) IBW/kg (Calculated) : 86.8 Heparin Dosing Weight: 61 kg  Vital Signs: Temp: 99.7 F (37.6 C) (03/06 1500) BP: 141/84 mmHg (03/06 1500)  Labs:  Recent Labs  12/28/15 0012 12/28/15 0519 12/28/15 1156 12/28/15 1732 12/28/15 2219 12/29/15 0455 12/29/15 0802 12/29/15 1529  HGB 7.3* 7.3*  --  9.2*  --  9.6*  --   --   HCT 25.5* 24.0*  --  29.5*  --  31.1*  --   --   PLT 428 392  --  409  --  408  --   --   APTT  --   --  38*  --   --   --   --   --   LABPROT  --   --  16.8*  --   --   --   --   --   INR  --   --  1.35  --   --   --   --   --   HEPARINUNFRC  --   --   --   --  0.15*  --  <0.10* 0.17*  CREATININE 1.26* 1.22  --   --   --  1.24  --   --   TROPONINI 0.27*  --   --   --   --   --   --   --     Estimated Creatinine Clearance: 47.6 mL/min (by C-G formula based on Cr of 1.24).   Assessment: 70 yo male with hx of CAD s/p cardiac arrest to start on heparin drip  Goal of Therapy:  Heparin level 0.3-0.7 units/ml Monitor platelets by anticoagulation protocol: Yes   Plan:  Heparin level remains below goal.  Will bolus heparin 1800 units iv once and increase infusion to 1250 units/hr and recheck another HL in 6 hours.   Ulice Dash, PharmD Clinical Pharmacist   12/29/2015,4:03 PM

## 2015-12-29 NOTE — Progress Notes (Signed)
PARENTERAL NUTRITION CONSULT NOTE - FOLLOW UP   Pharmacy Consult for TPN Electrolyte and Glucose Monitoirng  Indication: Bowel Perforation  No Known Allergies  Patient Measurements: Height: '6\' 4"'$  (193 cm) Weight: 133 lb 13.1 oz (60.7 kg) IBW/kg (Calculated) : 86.8   Vital Signs: Temp: 100.8 F (38.2 C) (03/06 0500) Temp Source: Rectal (03/05 1900) BP: 132/81 mmHg (03/06 0600) Intake/Output from previous day: 03/05 0701 - 03/06 0700 In: 3150.2 [I.V.:955.8; Blood:320; URK:2706.2] Out: 2050 [Urine:2050] Intake/Output from this shift: Total I/O In: 1165.1 [I.V.:418.1; TPN:747] Out: 1100 [Urine:1100]  Labs:  Recent Labs  12/28/15 0519 12/28/15 1156 12/28/15 1732 12/29/15 0455  WBC 17.8*  --  16.6* 15.7*  HGB 7.3*  --  9.2* 9.6*  HCT 24.0*  --  29.5* 31.1*  PLT 392  --  409 408  APTT  --  38*  --   --   INR  --  1.35  --   --      Recent Labs  12/26/15 1109 12/27/15 0419 12/28/15 0012 12/28/15 0519 12/28/15 1729 12/29/15 0455  NA 147* 148* 145 149*  --  147*  K 3.8 3.6 3.2* 3.5 3.3* 4.3  CL 112* 113* 109 114*  --  114*  CO2 '25 28 24 30  '$ --  27  GLUCOSE 229* 326* 600* 416*  --  317*  BUN 33* 32* 29* 34*  --  33*  CREATININE 1.34* 1.24 1.26* 1.22  --  1.24  CALCIUM 8.1* 7.9* 7.4* 7.8*  --  8.0*  MG 1.9 2.0  --  2.3  --  2.0  PHOS 3.5 3.2  --  3.7  --  3.3  PROT 6.5 6.4* 5.7*  --   --   --   ALBUMIN 1.8* 1.9* 1.6*  --   --   --   AST 20 20 45*  --   --   --   ALT '31 26 29  '$ --   --   --   ALKPHOS 101 111 96  --   --   --   BILITOT 1.1 0.8 0.7  --   --   --   TRIG  --  139  --   --   --   --    Estimated Creatinine Clearance: 47.6 mL/min (by C-G formula based on Cr of 1.24).    Recent Labs  12/28/15 1944 12/28/15 2351 12/29/15 0344  GLUCAP 196* 245* 308*    Medical History: Past Medical History  Diagnosis Date  . Hyperlipidemia   . Hypertension   . Diabetes mellitus ([place])   . CAD (coronary artery disease)     a. s/p 2 v CABG in 2012  (LIMA-LAD & SVG-OM); b. cath 07/2012 s/p PCI/DES to ostial LCx and OM  . PAD (peripheral artery disease) ([place])     a. s/p right SFA stent; b. s/p right toe amputation 2011; c. s/p right AKA spring 2016  . Seizures ([place])   . Gangrene of foot ([place])   . Wheezing   . Lung cancer ([place])   . Prostate cancer ([place])   . Stroke [facility])     Medications:  Scheduled:  . anidulafungin  100 mg Intravenous Q24H  . antiseptic oral rinse  7 mL Mouth Rinse 10 times per day  . chlorhexidine gluconate  15 mL Mouth Rinse BID  . insulin aspart  0-20 Units Subcutaneous 6 times per day  . insulin glargine  20 Units Subcutaneous Daily  . pantoprazole (PROTONIX)  IV  40 mg Intravenous Q24H  . piperacillin-tazobactam (ZOSYN)  IV  3.375 g Intravenous 3 times per day  . vancomycin  1,000 mg Intravenous Q18H   Infusions:  . Marland KitchenTPN (CLINIMIX-E) Adult 83 mL/hr at 12/28/15 1725  . amiodarone 30 mg/hr (12/29/15 0221)  . fentaNYL 10 mcg/ml infusion 200 mcg/hr (12/28/15 2155)  . heparin 850 Units/hr (12/28/15 2346)  . norepinephrine (LEVOPHED) Adult infusion Stopped (12/26/15 2151)    Insulin Requirements in the past 24 hours:    Current Nutrition:  Clinimix E 5/15 at 83 ml/min   Assessment: 70 y/o M with multiple medical problems including bowel perforation.  SSI requirement over past 24 hours: 52 units   Plan:  Patient is on SSI which will be continued while patient is on TPN. Will increase to  Lantus 20 units qhs.  K 4.3, Mg 2, phos 3.3, corrected calcium 9.9. No further supplement warranted at this time, will recheck electrolytes with AM labs.   Laural Benes, Pharm.D., BCPS Clinical Pharmacist 12/29/2015,6:47 AM

## 2015-12-29 NOTE — Care Management (Addendum)
Patient had cardiac arrest 3/5.  Currently on Amiodarone drip.  Family not present but CM was informed that they are still wishing to pursue aggressive treatment.  Tracheostomy had been planned for Tuesday but there is some discussion as to whether ENT will proceed.  Cardiology has cleared patient from cardiac standpoint.   Updated [facility] .

## 2015-12-29 NOTE — Consult Note (Signed)
WOC wound consult note Reason for Consult:Unstageable pressure injury to sacrum. Was noted as a Stage 2 and 100% pink wound bed.  Patient had a code blue yesterday.  Wound has declined.  Wound type:Unstageable pressure injury to sacrum Pressure Ulcer POA: Yes  Has declined from stage 2.  S/P code blue, 12/28/15.  CAD.  Measurement:6 cm x 5 cm across sacrum and bilateral upper buttocks.  Center is 3 cm x 2.5 cm darkened devitalized tissue.  Foul odor is present.  Wound bed:50% devitalized tissue.  Drainage (amount, consistency, odor) Moderate serosanguinous drainage, with foul odor Periwound:Intact Dressing procedure/placement/frequency:Cleanse sacral wound with NS and pat gently dry.  Apply Santyl to wound bed.  Cover with NS moist gauze.  Apply ABD pad and tape.  Change daily.  Will not follow at this time.  Please re-consult if needed.  Domenic Moras RN BSN [place] Pager [phone]

## 2015-12-30 ENCOUNTER — Encounter
Admission: EM | Disposition: A | Discharge status: Nursing Home (Includes ICF) | Payer: Self-pay | Source: Home / Self Care | Attending: Vascular Surgery

## 2015-12-30 ENCOUNTER — Inpatient Hospital Stay: Payer: PPO | Admitting: Anesthesiology

## 2015-12-30 ENCOUNTER — Inpatient Hospital Stay: Payer: PPO

## 2015-12-30 DIAGNOSIS — I2699 Other pulmonary embolism without acute cor pulmonale: Secondary | ICD-10-CM | POA: Insufficient documentation

## 2015-12-30 DIAGNOSIS — I469 Cardiac arrest, cause unspecified: Secondary | ICD-10-CM | POA: Insufficient documentation

## 2015-12-30 HISTORY — PX: TRACHEOSTOMY TUBE PLACEMENT: SHX814

## 2015-12-30 LAB — GLUCOSE, CAPILLARY
GLUCOSE-CAPILLARY: 105 mg/dL — AB (ref 65–99)
GLUCOSE-CAPILLARY: 111 mg/dL — AB (ref 65–99)
GLUCOSE-CAPILLARY: 129 mg/dL — AB (ref 65–99)
GLUCOSE-CAPILLARY: 129 mg/dL — AB (ref 65–99)
GLUCOSE-CAPILLARY: 131 mg/dL — AB (ref 65–99)
GLUCOSE-CAPILLARY: 167 mg/dL — AB (ref 65–99)
GLUCOSE-CAPILLARY: 168 mg/dL — AB (ref 65–99)
GLUCOSE-CAPILLARY: 173 mg/dL — AB (ref 65–99)
GLUCOSE-CAPILLARY: 184 mg/dL — AB (ref 65–99)
GLUCOSE-CAPILLARY: 185 mg/dL — AB (ref 65–99)
GLUCOSE-CAPILLARY: 98 mg/dL (ref 65–99)
Glucose-Capillary: 202 mg/dL — ABNORMAL HIGH (ref 65–99)
Glucose-Capillary: 175 mg/dL — ABNORMAL HIGH (ref 65–99)
Glucose-Capillary: 146 mg/dL — ABNORMAL HIGH (ref 65–99)
Glucose-Capillary: 159 mg/dL — ABNORMAL HIGH (ref 65–99)
Glucose-Capillary: 186 mg/dL — ABNORMAL HIGH (ref 65–99)
Glucose-Capillary: 201 mg/dL — ABNORMAL HIGH (ref 65–99)
Glucose-Capillary: 208 mg/dL — ABNORMAL HIGH (ref 65–99)
Glucose-Capillary: 122 mg/dL — ABNORMAL HIGH (ref 65–99)
Glucose-Capillary: 167 mg/dL — ABNORMAL HIGH (ref 65–99)

## 2015-12-30 LAB — BASIC METABOLIC PANEL
Anion gap: 4 — ABNORMAL LOW (ref 5–15)
BUN: 36 mg/dL — ABNORMAL HIGH (ref 6–20)
CHLORIDE: 114 mmol/L — AB (ref 101–111)
CO2: 28 mmol/L (ref 22–32)
CREATININE: 1.08 mg/dL (ref 0.61–1.24)
Calcium: 8.2 mg/dL — ABNORMAL LOW (ref 8.9–10.3)
Glucose, Bld: 190 mg/dL — ABNORMAL HIGH (ref 65–99)
POTASSIUM: 4.5 mmol/L (ref 3.5–5.1)
SODIUM: 146 mmol/L — AB (ref 135–145)

## 2015-12-30 LAB — CBC
HCT: 29 % — ABNORMAL LOW (ref 40.0–52.0)
HEMOGLOBIN: 9 g/dL — AB (ref 13.0–18.0)
MCH: 28.2 pg (ref 26.0–34.0)
MCHC: 31 g/dL — AB (ref 32.0–36.0)
MCV: 91 fL (ref 80.0–100.0)
PLATELETS: 326 10*3/uL (ref 150–440)
RBC: 3.19 MIL/uL — ABNORMAL LOW (ref 4.40–5.90)
RDW: 18.4 % — ABNORMAL HIGH (ref 11.5–14.5)
WBC: 14.7 10*3/uL — ABNORMAL HIGH (ref 3.8–10.6)

## 2015-12-30 LAB — PHOSPHORUS: PHOSPHORUS: 3.7 mg/dL (ref 2.5–4.6)

## 2015-12-30 LAB — MAGNESIUM: MAGNESIUM: 1.9 mg/dL (ref 1.7–2.4)

## 2015-12-30 SURGERY — CREATION, TRACHEOSTOMY
Anesthesia: General

## 2015-12-30 MED ORDER — HEPARIN BOLUS VIA INFUSION
900.0000 [IU] | Freq: Once | INTRAVENOUS | Status: DC
  Filled 2015-12-30: qty 900

## 2015-12-30 MED ORDER — M.V.I. ADULT IV INJ
INJECTION | INTRAVENOUS | Status: AC
  Administered 2015-12-30: 18:00:00 via INTRAVENOUS
  Filled 2015-12-30: qty 1992

## 2015-12-30 MED ORDER — LACTATED RINGERS IV SOLN
INTRAVENOUS | Status: DC | PRN
  Administered 2015-12-30: 09:00:00 via INTRAVENOUS

## 2015-12-30 MED ORDER — MAGNESIUM SULFATE 2 GM/50ML IV SOLN
2.0000 g | Freq: Once | INTRAVENOUS | Status: AC
  Administered 2015-12-30: 2 g via INTRAVENOUS
  Filled 2015-12-30: qty 50

## 2015-12-30 MED ORDER — INSULIN GLARGINE 100 UNIT/ML ~~LOC~~ SOLN
12.0000 [IU] | Freq: Once | SUBCUTANEOUS | Status: AC
  Administered 2015-12-30: 12 [IU] via SUBCUTANEOUS
  Filled 2015-12-30: qty 0.12

## 2015-12-30 MED ORDER — LIDOCAINE-EPINEPHRINE (PF) 1 %-1:200000 IJ SOLN
INTRAMUSCULAR | Status: DC | PRN
  Administered 2015-12-30: 4 mL

## 2015-12-30 MED ORDER — LIDOCAINE-EPINEPHRINE 1 %-1:100000 IJ SOLN
INTRAMUSCULAR | Status: AC
  Filled 2015-12-30: qty 1

## 2015-12-30 MED ORDER — PHENYLEPHRINE HCL 10 MG/ML IJ SOLN
INTRAMUSCULAR | Status: DC | PRN
  Administered 2015-12-30: 200 ug via INTRAVENOUS
  Administered 2015-12-30 (×2): 300 ug via INTRAVENOUS

## 2015-12-30 MED ORDER — INSULIN ASPART 100 UNIT/ML ~~LOC~~ SOLN
5.0000 [IU] | SUBCUTANEOUS | Status: DC
  Administered 2015-12-30: 5 [IU] via SUBCUTANEOUS
  Filled 2015-12-30: qty 5

## 2015-12-30 MED ORDER — MIDAZOLAM HCL 2 MG/2ML IJ SOLN
INTRAMUSCULAR | Status: DC | PRN
  Administered 2015-12-30: 2 mg via INTRAVENOUS

## 2015-12-30 MED ORDER — ROCURONIUM BROMIDE 100 MG/10ML IV SOLN
INTRAVENOUS | Status: DC | PRN
  Administered 2015-12-30: 20 mg via INTRAVENOUS
  Administered 2015-12-30: 30 mg via INTRAVENOUS

## 2015-12-30 MED ORDER — ONDANSETRON HCL 4 MG/2ML IJ SOLN
INTRAMUSCULAR | Status: DC | PRN
  Administered 2015-12-30: 4 mg via INTRAVENOUS

## 2015-12-30 MED ORDER — PHENYLEPHRINE HCL 10 MG/ML IJ SOLN
10000.0000 ug | INTRAMUSCULAR | Status: DC | PRN
  Administered 2015-12-30: 70 ug/min via INTRAVENOUS

## 2015-12-30 MED ORDER — PANTOPRAZOLE SODIUM 40 MG IV SOLR
40.0000 mg | INTRAVENOUS | Status: DC
Start: 2015-12-31 — End: 2016-01-04
  Administered 2015-12-31 – 2016-01-03 (×4): 40 mg via INTRAVENOUS
  Filled 2015-12-30 (×4): qty 40

## 2015-12-30 MED ORDER — FENTANYL CITRATE (PF) 100 MCG/2ML IJ SOLN
INTRAMUSCULAR | Status: DC | PRN
  Administered 2015-12-30: 100 ug via INTRAVENOUS

## 2015-12-30 SURGICAL SUPPLY — 26 items
BLADE SURG 15 STRL LF DISP TIS (BLADE) ×1 IMPLANT
BLADE SURG 15 STRL SS (BLADE) ×2
BLADE SURG SZ11 CARB STEEL (BLADE) ×3 IMPLANT
CANISTER SUCT 1200ML W/VALVE (MISCELLANEOUS) ×3 IMPLANT
ELECT CAUTERY BLADE TIP 2.5 (TIP) ×3
ELECT REM PT RETURN 9FT ADLT (ELECTROSURGICAL) ×3
ELECTRODE CAUTERY BLDE TIP 2.5 (TIP) ×1 IMPLANT
ELECTRODE REM PT RTRN 9FT ADLT (ELECTROSURGICAL) ×1 IMPLANT
GAUZE IODOFORM PACK 1/2 7832 (GAUZE/BANDAGES/DRESSINGS) IMPLANT
GLOVE BIO SURGEON STRL SZ7.5 (GLOVE) ×3 IMPLANT
GOWN STRL REUS W/ TWL LRG LVL3 (GOWN DISPOSABLE) ×3 IMPLANT
GOWN STRL REUS W/TWL LRG LVL3 (GOWN DISPOSABLE) ×6
HARMONIC SCALPEL FOCUS (MISCELLANEOUS) ×3 IMPLANT
HLDR TRACH TUBE NECKBAND 18 (MISCELLANEOUS) ×1 IMPLANT
HOLDER TRACH TUBE NECKBAND 18 (MISCELLANEOUS) ×2
LABEL OR SOLS (LABEL) IMPLANT
NS IRRIG 500ML POUR BTL (IV SOLUTION) ×3 IMPLANT
PACK HEAD/NECK (MISCELLANEOUS) ×3 IMPLANT
SPONGE EXCIL AMD DRAIN 4X4 6P (MISCELLANEOUS) ×3 IMPLANT
SUCTION FRAZIER HANDLE 10FR (MISCELLANEOUS) ×2
SUCTION TUBE FRAZIER 10FR DISP (MISCELLANEOUS) ×1 IMPLANT
SUT ETHILON 2 0 FS 18 (SUTURE) ×12 IMPLANT
SUT SILK 2 0 (SUTURE) ×2
SUT SILK 2-0 18XBRD TIE 12 (SUTURE) ×1 IMPLANT
SUT VIC AB 3-0 PS2 18 (SUTURE) ×6 IMPLANT
TUBE TRACH SHILEY 8 DIST CUF (TUBING) ×3 IMPLANT

## 2015-12-30 NOTE — Anesthesia Postprocedure Evaluation (Signed)
Anesthesia Post Note  Patient: Stephen Camacho  Procedure(s) Performed: Procedure(s) (LRB): TRACHEOSTOMY (N/A)  Patient location during evaluation: SICU Anesthesia Type: General Level of consciousness: sedated Pain management: pain level controlled Vital Signs Assessment: post-procedure vital signs reviewed and stable Respiratory status: patient on ventilator - see flowsheet for VS Cardiovascular status: stable Anesthetic complications: no    Last Vitals:  Filed Vitals:   12/30/15 0941 12/30/15 1000  BP: 98/63 98/62  Pulse:    Temp: 36.9 C 36.9 C  Resp: 16 19    Last Pain:  Filed Vitals:   12/30/15 1036  PainSc: 0-No pain                 [name],Stephen Camacho

## 2015-12-30 NOTE — Progress Notes (Addendum)
Inpatient Diabetes Program Recommendations  AACE/ADA: New Consensus Statement on Inpatient Glycemic Control (2015)  Target Ranges:  Prepandial:   less than 140 mg/dL      Peak postprandial:   less than 180 mg/dL (1-2 hours)      Critically ill patients:  140 - 180 mg/dL   Review of Glycemic Control  Results for DAT, DERKSEN (MRN [id number]) as of 12/30/2015 10:36  Ref. Range 12/30/2015 04:18 12/30/2015 05:15 12/30/2015 05:58 12/30/2015 07:13 12/30/2015 09:52  Glucose-Capillary Latest Ref Range: 65-99 mg/dL 175 (H) 146 (H) 159 (H) 129 (H) 131 (H)    Patient meets criteria to transition to phase 3 ICU Glycemic Control order set. Based on the current insulin rate of 2 units/hour, consider Lantus 20 units qday starting 2 hours before the insulin drip is stopped.  Novolog correction insulin 2-6 units q4h. TPN coverage 3 units q4h- if TPN is stopped, please hold this TPN insulin coverage.   Gentry Fitz, RN, BA, MHA, CDE Diabetes Coordinator Inpatient Diabetes Program  [phone] (Team Pager) [phone] ([place]) 12/30/2015 10:51 AM

## 2015-12-30 NOTE — Progress Notes (Signed)
I looked for family members following the tracheostomy but could not find anyone.  Have alerted unit secretary to call me when they arrive if they have any questions.

## 2015-12-30 NOTE — Progress Notes (Signed)
SUBJECTIVE: Remains intubated and unresponsive   Filed Vitals:   12/30/15 0600 12/30/15 0700 12/30/15 0800 12/30/15 0824  BP: 121/73 115/71 134/80   Pulse:      Temp: 99 F (37.2 C) 98.8 F (37.1 C) 99 F (37.2 C)   TempSrc:      Resp: '13 14 15   '$ Height:      Weight:      SpO2: 100% 100% 100% 100%    Intake/Output Summary (Last 24 hours) at 12/30/15 0943 Last data filed at 12/30/15 0743  Gross per 24 hour  Intake 2931.23 ml  Output   1520 ml  Net 1411.23 ml    LABS: Basic Metabolic Panel:  Recent Labs  12/29/15 0455 12/30/15 0452  NA 147* 146*  K 4.3 4.5  CL 114* 114*  CO2 27 28  GLUCOSE 317* 190*  BUN 33* 36*  CREATININE 1.24 1.08  CALCIUM 8.0* 8.2*  MG 2.0 1.9  PHOS 3.3 3.7   Liver Function Tests:  Recent Labs  12/28/15 0012  AST 45*  ALT 29  ALKPHOS 96  BILITOT 0.7  PROT 5.7*  ALBUMIN 1.6*   No results for input(s): LIPASE, AMYLASE in the last 72 hours. CBC:  Recent Labs  12/29/15 0455 12/30/15 0452  WBC 15.7* 14.7*  HGB 9.6* 9.0*  HCT 31.1* 29.0*  MCV 90.6 91.0  PLT 408 326   Cardiac Enzymes:  Recent Labs  12/28/15 0012  TROPONINI 0.27*   BNP: Invalid input(s): POCBNP D-Dimer: No results for input(s): DDIMER in the last 72 hours. Hemoglobin A1C: No results for input(s): HGBA1C in the last 72 hours. Fasting Lipid Panel: No results for input(s): CHOL, HDL, LDLCALC, TRIG, CHOLHDL, LDLDIRECT in the last 72 hours. Thyroid Function Tests: No results for input(s): TSH, T4TOTAL, T3FREE, THYROIDAB in the last 72 hours.  Invalid input(s): FREET3 Anemia Panel: No results for input(s): VITAMINB12, FOLATE, FERRITIN, TIBC, IRON, RETICCTPCT in the last 72 hours.   PHYSICAL EXAM General: Well developed, well nourished, in no acute distress HEENT:  Normocephalic and atramatic Neck:  No JVD.  Lungs: Clear bilaterally to auscultation and percussion. Heart: HRRR . Normal S1 and S2 without gallops or murmurs.  Abdomen: Bowel sounds are  positive, abdomen soft and non-tender  Msk:  Back normal, normal gait. Normal strength and tone for age. Extremities: No clubbing, cyanosis or edema.   Neuro: Alert and oriented X 3. Psych:  Good affect, responds appropriately  TELEMETRY: Sinus rhythm  ASSESSMENT AND PLAN: Respiratory failure with status post cardiac arrest. Left ventricular ejection fraction is 41% with septal hypokinesis suggestive of coronary artery disease. Patient has above-the-knee amputations bilaterally and is not a candidate for revascularization. Advise making the patient DO NOT RESUSCITATE. Patient is getting tracheostomy today.  Active Problems:   Ischemic leg   Acute respiratory failure (HCC)   Ischemia of lower extremity   Altered mental status   Hematoma of neck   Occlusion of arterial bypass graft (HCC)   NSTEMI (non-ST elevated myocardial infarction) (HCC)   Severe sepsis (HCC)   Urinary retention   Aspiration into respiratory tract   Encounter for intubation   Pressure ulcer   Abdominal distension   Free intraperitoneal air   Ileus ([place])   Stroke (HCC)    Stephen David, MD, [facility] 12/30/2015 9:43 AM

## 2015-12-30 NOTE — Progress Notes (Signed)
GI Inpatient Follow-up Note  Subjective:  No change.  Trach placed.   On TPN.   Abd distended.  KUB with air and contrast in L colon.    Scheduled Inpatient Medications:  . anidulafungin  100 mg Intravenous Q24H  . antiseptic oral rinse  7 mL Mouth Rinse 10 times per day  . chlorhexidine gluconate  15 mL Mouth Rinse BID  . collagenase   Topical Daily  . [START ON 12/31/2015] pantoprazole (PROTONIX) IV  40 mg Intravenous Q24H  . piperacillin-tazobactam (ZOSYN)  IV  3.375 g Intravenous 3 times per day  . vancomycin  1,000 mg Intravenous Q18H    Continuous Inpatient Infusions:   . Marland KitchenTPN (CLINIMIX-E) Adult 83 mL/hr at 12/29/15 1802  . Marland KitchenTPN (CLINIMIX-E) Adult    . amiodarone 30 mg/hr (12/30/15 1522)  . fentaNYL 10 mcg/ml infusion 250 mcg/hr (12/30/15 0605)  . insulin (NOVOLIN-R) infusion 2.6 mL/hr at 12/30/15 0743  . norepinephrine (LEVOPHED) Adult infusion Stopped (12/26/15 2151)    PRN Inpatient Medications:  acetaminophen **OR** acetaminophen, ibuprofen, ipratropium-albuterol, midazolam  Review of Systems:  Cannot obtain   Physical Examination: BP 108/68 mmHg  Pulse 102  Temp(Src) 98.2 F (36.8 C) (Other (Comment))  Resp 15  Ht '6\' 4"'$  (1.93 m)  Wt 62.6 kg (138 lb 0.1 oz)  BMI 16.81 kg/m2  SpO2 97% Gen: NAD, alert and oriented x 0 Neck: +trach Chest: course bilat CV: RRR, no m/g/c/r Abd: mild distension,+tympanic,  decreased bs, NT.  Ext: bilat aka   Data: Lab Results  Component Value Date   WBC 14.7* 12/30/2015   HGB 9.0* 12/30/2015   HCT 29.0* 12/30/2015   MCV 91.0 12/30/2015   PLT 326 12/30/2015    Recent Labs Lab 12/28/15 1732 12/29/15 0455 12/30/15 0452  HGB 9.2* 9.6* 9.0*   Lab Results  Component Value Date   NA 146* 12/30/2015   K 4.5 12/30/2015   CL 114* 12/30/2015   CO2 28 12/30/2015   BUN 36* 12/30/2015   CREATININE 1.08 12/30/2015   Lab Results  Component Value Date   ALT 29 12/28/2015   AST 45* 12/28/2015   ALKPHOS 96 12/28/2015    BILITOT 0.7 12/28/2015    Recent Labs Lab 12/28/15 1156  APTT 38*  INR 1.35   Assessment/Plan: Mr. Weatherall is a 70 y.o. male with failure to wean of vent now s/p trach.  Failed PEG on 12/24/15 with free air on f/u images.  Free air has resolved on f/u imaging and no contrast from CT leaked into abdomen. No clinical changes since free air was demonstrated.  Likely small bowel perf from needle has now sealed.   Recommendations: - I recommend radiology attempt at g-tube with fluoro and barium into transverse colon this week.  - Will request surgical team input on timing of this g-tube placement. - daily KUB   Please call with questions or concerns.  REIN, Grace Blight, MD

## 2015-12-30 NOTE — Progress Notes (Signed)
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 Now with acute respiratory failure, intubated due to pneumonia. Underwent a left AKA   SUBJECTIVE:  S/p trach, remains on Vent, abd is distended Zosyn/Vanc/antfungal therapy for complication from PEG tube placement s/p bowel/stomach perforation and candida in sputum Prognosis is very poor   VITAL SIGNS: BP 98/62 mmHg  Pulse 102  Temp(Src) 98.4 F (36.9 C) (Other (Comment))  Resp 19  Ht '6\' 4"'$  (1.93 m)  Wt 138 lb 0.1 oz (62.6 kg)  BMI 16.81 kg/m2  SpO2 97%  HEMODYNAMICS:    VENTILATOR SETTINGS: Vent Mode:  [-] PRVC FiO2 (%):  [30 %] 30 % Set Rate:  [15 bmp] 15 bmp Vt Set:  [500 mL] 500 mL PEEP:  [5 cmH20] 5 cmH20  INTAKE / OUTPUT: I/O last 3 completed shifts: In: 4621.2 [I.V.:1632.2; IV Piggyback:250] Out: 2820 [Urine:2820]  PHYSICAL EXAMINATION: General: Chronically ill appearing Neuro:Awake on the vent, not tracking and not following commands HEENT: NCAT, ETT in place Cardiovascular: RRR, S1/S2, no MRG Lungs: Bilateral airflow, no wheezes Abdomen: no BS, distended abd Ext: Bilateral AKA, Left stump clean and dry  LABS:  BMET  Recent Labs Lab 12/28/15 0519 12/28/15 1729 12/29/15 0455 12/30/15 0452  NA 149*  --  147* 146*  K 3.5 3.3* 4.3 4.5  CL 114*  --  114* 114*  CO2 30  --  27 28  BUN 34*  --  33* 36*  CREATININE 1.22  --  1.24 1.08  GLUCOSE 416*  --  317* 190*    Electrolytes  Recent Labs Lab 12/28/15 0519 12/29/15 0455 12/30/15 0452  CALCIUM 7.8* 8.0* 8.2*  MG 2.3 2.0 1.9  PHOS 3.7 3.3 3.7     CBC  Recent Labs Lab 12/28/15 1732 12/29/15 0455 12/30/15 0452  WBC 16.6* 15.7* 14.7*  HGB 9.2* 9.6* 9.0*  HCT 29.5* 31.1* 29.0*  PLT 409 408 326    Coag's  Recent Labs Lab 12/28/15 1156  APTT 38*  INR 1.35    Sepsis Markers  Recent Labs Lab 12/28/15 0012 12/28/15 0300  LATICACIDVEN 6.0* 2.2*    ABG  Recent Labs Lab 12/25/15 1000 12/28/15 0416  PHART 7.52* 7.41  PCO2ART 32 47  PO2ART 124* 175*    Liver Enzymes  Recent Labs Lab 12/26/15 1109 12/27/15 0419 12/28/15 0012  AST 20 20 45*  ALT '31 26 29  '$ ALKPHOS 101 111 96  BILITOT 1.1 0.8 0.7  ALBUMIN 1.8* 1.9* 1.6*    Cardiac Enzymes  Recent Labs Lab 12/28/15 0012  TROPONINI 0.27*    Glucose  Recent Labs Lab 12/30/15 0418 12/30/15 0515 12/30/15 0558 12/30/15 0713 12/30/15 0952 12/30/15 1118  GLUCAP 175* 146* 159* 129* 131* 173*   Dg Abd 1 View  12/30/2015  CLINICAL DATA:  Check OG tube placement EXAM: ABDOMEN - 1 VIEW COMPARISON:  12/27/15 FINDINGS: Gastric catheter is noted within the stomach. Stable bibasilar changes are noted. Contrast is noted within the colon from previous CT examination. IMPRESSION: Catheter within the stomach as described. Electronically Signed   By: Inez Catalina M.D.   On: 12/30/2015 11:41  EVENTS/DATA: 02/10 >> LLE angiogram, thrombolysis, thrombectomy, angioplasty 02/11 Intubated for suspected PNA 02/11 elevated trop I. Pk trop I 62.6 on 02/20 02/12 CT head: New nonhemorrhagic infarcts involving the right PCA territory and bilateral cerebellum, left greater than right 02/17 L AKA 02/18>extubated 02/21 TTE: LVEF 55-60% 02/21 RUE [place]: no DVT 02/21>Re-intubated 02/22 >CT head: Interval evolution of bilateral cerebellar and right PCA territory infarcts 02/27-remains intubated,discussion with family-proceed with trach and PEG 12/24/2015: Attempted EGD and PEG placement resulting in complication of bowel perforation 03/04-Asystole arrest; CPR x 12 minutes  with ROSC 03/07 -trach placed  INDWELLING DEVICES:: ETT 02/11 >> 02/18, 02/21 >>  R IJ CVL 02/10 >> removed PICC line RT  MICRO DATA: MRSA PCR 02/13 >> NEG Urine 02/12 >> NEG Resp 02/11 >> MSSA C diff 02/16 >> NEG Blood 02/12 >> NEG Urine 02/19 >> NEG C diff 02/21 >> NEG Cath tip 02/19 >> NEG Resp 02/18 >> light growth candida Urine 02/21 >> NEG MRSA PCR 02/22 >> NEG Respiratory 02/26>>  ANTIMICROBIALS:  Vanc 02/11 >> 02/23 Pip-tazo 02/11 >> 02/20 Ceftaz 02/21 >> 02/24 Metronidazole 02/21 >> 02/24 Pip-tazo 02/24 >>  Vancomycin 02/25>>  ASSESSMENT / PLAN: 70 YO male with 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 Now with acute respiratory failure, intubated due to pneumonia. Underwent a left AKA 02/17, failed extubation and prolonged ventilator dependence, bowel perforation s/p peg tube placement attempt, and cardiac arrest. Clinical presentation consistent with anoxic brain injury. Given his multiple co-morbidities and frail status, his overall prognosis is very poor.  PULMONARY A: Prolonged VDRF Failed extubation attempt Bilateral pulmonary infiltrates and fever- c/w  PNA P:   -Cont vent support -settings reviewed -Cont vent bundle -Daily SBT if/when meets criteria -Antibiotics as above  -CXR as needed -s/p Trach -remains on vent  CARDIOVASCULAR A:  Cardiac arrest Recurrent NSTEMI PAF, NSR presently NSVT Acute CHF with proBNP>45000; Echo at bedside revealed ejection fraction 41% with moderate to severe hypokinesis of the septum and inferior wall moderate mitral regurgitation and severe pulmonary hypertension with PA systolic pressure 67 mmHg S/p cardiac arrest-prognosis is poor P:  -Heparin gtt stopped -MAP goal > 65 mmHg -Cont ASA -Continue amiodarone   -Hemodynamics per ICU protocol  RENAL A:   AKI, nonoliguric-improved  Very poor candidate for HD P:   -Monitor BMET intermittently -Monitor I/Os -Correct electrolytes as indicated  GASTROINTESTINAL A:   No issues P:   - PPI -Now with bowel/stomach perforation -Awaiting GI and surgery recs-will place OG to suction -Continue TPN  HEMATOLOGIC A:   ICU acquired anemia without acute blood loss P:  -DVT px: SQ heparin -Monitor CBC intermittently -Transfuse per usual guidelines   INFECTIOUS A:   Severe sepsis-persistent leukocytosis; no fever in 24hrs LLE gangrene Aspiration PNA P:   -Vanco and zosyn, antifungal therapy - Repeat cultures if febrile -Tylenol and motrin prn for fever >103 - Follow up ID recs  ENDOCRINE A:   Type 2 DM-Persistent hyperglycemia P:   -on insulin infusion -Continue blood gluocose monitoring per ICU protocol  NEUROLOGIC A:   Multifocal CVAs, acute Acute encewphalopathy H/O seizures - not on chronic AEDs PTA P:   RASS goal: -1, -2 -Fentanyl gtt and prn versed for vent sedation  I have personally obtained a history, examined the patient, evaluated Pertinent laboratory and RadioGraphic/imaging results, and  formulated the  assessment and plan   The Patient requires high complexity decision making for assessment and support, frequent evaluation and titration of therapies, application of advanced monitoring technologies and extensive interpretation of multiple databases. Critical Care Time devoted to patient care services described in this note is 45 minutes.   Overall, patient is critically ill, prognosis is guarded.  Patient with Multiorgan failure and at high risk for cardiac arrest and death.  Very poor chance of meaningful recovery  Corrin Parker, M.D.  [facility]  Medical Director [place] Director [place] Department

## 2015-12-30 NOTE — Progress Notes (Signed)
Nutrition Follow-up     INTERVENTION:   PN: recommend continuing current TPN   NUTRITION DIAGNOSIS:   Inadequate oral intake related to inability to eat as evidenced by NPO status.  GOAL:   Patient will meet greater than or equal to 90% of their needs   MONITOR:    (Energy Intake, Electrolyte and renal Profile, Anthropometrics, Digestive System, Glucose Profile, Pulmonary Profile)  REASON FOR ASSESSMENT:   Consult New TPN/TNA  ASSESSMENT:   Pt remains on vent, s/p trach this AM  Diet Order:  .TPN (CLINIMIX-E) Adult Diet NPO time specified .TPN (CLINIMIX-E) Adult  PN: 5%AA/15%Dextrose at 83 ml/hr  Skin:   (stage II pressure ulcer on sacrum and lip)  Last BM:  12/07/2015    Recent Labs Lab 12/28/15 0519 12/28/15 1729 12/29/15 0455 12/30/15 0452  NA 149*  --  147* 146*  K 3.5 3.3* 4.3 4.5  CL 114*  --  114* 114*  CO2 30  --  27 28  BUN 34*  --  33* 36*  CREATININE 1.22  --  1.24 1.08  CALCIUM 7.8*  --  8.0* 8.2*  MG 2.3  --  2.0 1.9  PHOS 3.7  --  3.3 3.7  GLUCOSE 416*  --  317* 190*    Glucose Profile:  Recent Labs  12/30/15 0952 12/30/15 1118 12/30/15 1253  GLUCAP 131* 173* 186*   Meds: insulin drip  Height:   Ht Readings from Last 1 Encounters:  12/29/15 '6\' 4"'$  (1.93 m)    Weight:   Wt Readings from Last 1 Encounters:  12/30/15 138 lb 0.1 oz (62.6 kg)   Filed Weights   12/28/15 0500 12/29/15 0409 12/30/15 0500  Weight: 134 lb 7.7 oz (61 kg) 133 lb 13.1 oz (60.7 kg) 138 lb 0.1 oz (62.6 kg)    BMI:  Body mass index is 16.81 kg/(m^2).  Estimated Nutritional Needs:   Kcal:  1371 kcals (Ve: 8.7, Tmax: 38.4) using wt of 59 kg, length of 44 inches  Protein:  89-118 g (1.5-2.0 g/kg)   Fluid:  1770-2065 mL (30-35 ml/kg)   EDUCATION NEEDS:   No education needs identified at this time  [place], RD, LDN [phone] Pager  [phone] Weekend/On-Call Pager

## 2015-12-30 NOTE — Progress Notes (Signed)
PARENTERAL NUTRITION CONSULT NOTE - FOLLOW UP   Pharmacy Consult for TPN Electrolyte and Glucose Monitoirng  Indication: Bowel Perforation  No Known Allergies  Patient Measurements: Height: '6\' 4"'$  (193 cm) Weight: 138 lb 0.1 oz (62.6 kg) IBW/kg (Calculated) : 86.8   Vital Signs: Temp: 98.2 F (36.8 C) (03/07 1500) Temp Source: Other (Comment) (03/07 0941) BP: 117/71 mmHg (03/07 1500) Intake/Output from previous day: 03/06 0701 - 03/07 0700 In: 3122.9 [I.V.:1163.9; IV Piggyback:50; NWG:9562] Out: 1308 [Urine:1720] Intake/Output from this shift: Total I/O In: 724.7 [I.V.:641.7; TPN:83] Out: 730 [Urine:725; Blood:5]  Labs:  Recent Labs  12/28/15 1156 12/28/15 1732 12/29/15 0455 12/30/15 0452  WBC  --  16.6* 15.7* 14.7*  HGB  --  9.2* 9.6* 9.0*  HCT  --  29.5* 31.1* 29.0*  PLT  --  409 408 326  APTT 38*  --   --   --   INR 1.35  --   --   --      Recent Labs  12/28/15 0012 12/28/15 0519 12/28/15 1729 12/29/15 0455 12/30/15 0452  NA 145 149*  --  147* 146*  K 3.2* 3.5 3.3* 4.3 4.5  CL 109 114*  --  114* 114*  CO2 24 30  --  27 28  GLUCOSE 600* 416*  --  317* 190*  BUN 29* 34*  --  33* 36*  CREATININE 1.26* 1.22  --  1.24 1.08  CALCIUM 7.4* 7.8*  --  8.0* 8.2*  MG  --  2.3  --  2.0 1.9  PHOS  --  3.7  --  3.3 3.7  PROT 5.7*  --   --   --   --   ALBUMIN 1.6*  --   --   --   --   AST 45*  --   --   --   --   ALT 29  --   --   --   --   ALKPHOS 96  --   --   --   --   BILITOT 0.7  --   --   --   --    Estimated Creatinine Clearance: 56.4 mL/min (by C-G formula based on Cr of 1.08).    Recent Labs  12/30/15 1253 12/30/15 1357 12/30/15 1459  GLUCAP 186* 201* 208*    Medical History: Past Medical History  Diagnosis Date  . Hyperlipidemia   . Hypertension   . Diabetes mellitus ([place])   . CAD (coronary artery disease)     a. s/p 2 v CABG in 2012 (LIMA-LAD & SVG-OM); b. cath 07/2012 s/p PCI/DES to ostial LCx and OM  . PAD (peripheral artery  disease) ([place])     a. s/p right SFA stent; b. s/p right toe amputation 2011; c. s/p right AKA spring 2016  . Seizures ([place])   . Gangrene of foot ([place])   . Wheezing   . Lung cancer ([place])   . Prostate cancer ([place])   . Stroke [facility])     Medications:  Scheduled:  . anidulafungin  100 mg Intravenous Q24H  . antiseptic oral rinse  7 mL Mouth Rinse 10 times per day  . chlorhexidine gluconate  15 mL Mouth Rinse BID  . collagenase   Topical Daily  . insulin glargine  12 Units Subcutaneous Once  . [START ON 12/31/2015] pantoprazole (PROTONIX) IV  40 mg Intravenous Q24H  . piperacillin-tazobactam (ZOSYN)  IV  3.375 g Intravenous 3 times per day  . vancomycin  1,000 mg Intravenous Q18H   Infusions:  . Marland KitchenTPN (CLINIMIX-E) Adult 83 mL/hr at 12/29/15 1802  . Marland KitchenTPN (CLINIMIX-E) Adult    . amiodarone 30 mg/hr (12/30/15 1522)  . fentaNYL 10 mcg/ml infusion 250 mcg/hr (12/30/15 0605)  . insulin (NOVOLIN-R) infusion 2.6 mL/hr at 12/30/15 0743  . norepinephrine (LEVOPHED) Adult infusion Stopped (12/26/15 2151)    Insulin Requirements in the past 24 hours: On continuous drip.    Current Nutrition:  Clinimix E 5/15 at 83 mL/min   Assessment: 70 y/o M with multiple medical problems including bowel perforation.  SSI requirement over past 24 hours: 52 units   Plan:  1. Electrolytes: Will replace magnesium 2g IV x1 for goal of magnesium >/= 2. Will obtain follow-up electrolytes with am labs.   2. Insulin: will continue on insulin drip. Will order Lantus 12 units x 1 in attempt to wean patient off drip. Patient will need to continue on insulin drip. Further management will be discussed on rounds on 3/8.    Pharmacy will continue to monitor and adjust per consult.   Forrest Jaroszewski L 12/30/2015,3:30 PM

## 2015-12-30 NOTE — Progress Notes (Addendum)
ANTICOAGULATION CONSULT NOTE - Initial Consult  Pharmacy Consult for Heparin Indication: chest pain/ACS  No Known Allergies  Patient Measurements: Height: '6\' 4"'$  (193 cm) Weight: 133 lb 13.1 oz (60.7 kg) IBW/kg (Calculated) : 86.8 Heparin Dosing Weight: 61 kg  Vital Signs: Temp: 99.5 F (37.5 C) (03/06 2100) BP: 138/85 mmHg (03/07 0000)  Labs:  Recent Labs  12/28/15 0012 12/28/15 0519 12/28/15 1156 12/28/15 1732  12/29/15 0455 12/29/15 0802 12/29/15 1529 12/29/15 2225  HGB 7.3* 7.3*  --  9.2*  --  9.6*  --   --   --   HCT 25.5* 24.0*  --  29.5*  --  31.1*  --   --   --   PLT 428 392  --  409  --  408  --   --   --   APTT  --   --  38*  --   --   --   --   --   --   LABPROT  --   --  16.8*  --   --   --   --   --   --   INR  --   --  1.35  --   --   --   --   --   --   HEPARINUNFRC  --   --   --   --   < >  --  <0.10* 0.17* 0.21*  CREATININE 1.26* 1.22  --   --   --  1.24  --   --   --   TROPONINI 0.27*  --   --   --   --   --   --   --   --   < > = values in this interval not displayed.  Estimated Creatinine Clearance: 47.6 mL/min (by C-G formula based on Cr of 1.24).   Assessment: 70 yo male with hx of CAD s/p cardiac arrest to start on heparin drip  Goal of Therapy:  Heparin level 0.3-0.7 units/ml Monitor platelets by anticoagulation protocol: Yes   Plan:  Heparin level remains below goal.  Will bolus heparin 1800 units iv once and increase infusion to 1250 units/hr and recheck another HL in 6 hours.   3/6 PM heparin level 0.21. 900 unit bolus and increase to 1350 units/hr. Recheck in 6 hours.  3/7 Per RN heparin is off for trach today. Level drawn before drip stopped so will still need to increase from previous rate.  Will need to reorder level after drip resumed.  Ulice Dash, PharmD Clinical Pharmacist   12/30/2015,12:53 AM

## 2015-12-30 NOTE — Consult Note (Addendum)
Palliative Medicine Inpatient Consult Note   Name: Stephen Camacho Date: 12/30/2015 MRN: [id number]  DOB: [birth year]  Referring Physician: [name], MD  Palliative Care consult requested for this 70 y.o. male for goals of medical therapy in patient with PVD and Acute Resp Failure --now s/p trache performed on 12/30/15.  HISTORY (very complex but summarized below): Pt is 70 yo man with a history of lung cancer and right lung lobectomy as well as severe PAD who was seen in the ER on 2/7 for complaint of pain in left leg.  He returned on 2/9 and was noted to have no pulses in that leg by doppler.  A CT angio showed occlusion of his prior fem-pop bypass which had been placed in 2013.    On 2/11, he had thrombectomy and angioplasty with stent placement in left leg vessels by Dr. Leotis Pain.  There was residual thrombosis of femoral to popliteal bypass graft and all tibial vessels. Post operatively, he developed respiratory distress and he was intubated. He was hypotensive and boluses of fluid and pressors were given.  He was also hypothermic with a temp of 35.6.    On 2/12, he was anemic and given transfusions.  A Troponin returned at 22.4.   Vanco and Zosyn were started for sepsis and pneumonia (possibly aspiration related due to high volume of secretions noted at time of intubation).  Serum Creatinine doubled overnight to 2.78. He was felt to have sepsis from necrosis of the left leg thrombosis with acute hypoxic respiratory failure due to sepsis and complicated by an acute NSTEMI.  He had some bradycardia which resolved.  Tube Feedings were initiated and constipation was managed.  Lactic Acid went from 17 to 4.5.  Troponin went as high as 60.  A head CT on 2/12 showed a new strokes (nonhemorrhagic, embolic-- involving right PCA territory and bilateral cerebellum with left greater than right involvement) as well as a hemorrhage at site of right neck (possible line misplacement).    By 2/13,  pressors were stopped but renal function was worsening due to ATN from contrast dye exposure and volume depletion. He also developed a DVT of left leg. Hgb was as low as 4.8 and after transfusions was up to 8.7 on 2/13.  He became combative with sedation reduced but he had no purposeful movements or interactions.   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    Pt was extubated on 2/18 but re-intubated on 2/21 due to worsening bilateral infiltrates.  On 2/23, Dr Ashby Dawes had a conversation with pt's son about pts grim prognosis and probable need for PEG and Trache and LTAC placement.  Son and daughter wanted to wait for the full two weeks of ventilation and make a decision about trache on 2/28.  He had runs of Vtach and was started on amiodarone and aspirin. He had a consult from ID about fevers.  WBC was noted to come down from 37 to 28.   He had a PEG placement attempted by Dr. Josefine Class on 12/24/15.  Gastritis was seen but esophagus was normal. But gaseous distension of the bowel prevented transillumination and placement. Imaging later showed free air likely related to needle placed during attempted PEG placement. It may have hit the transverse colon during a respiration.  Family was made aware of patient's bowel perforation. TPN was started with TF held.   On 3/1, Dr. Phoebe Perch, GI, spoke with daughter, Alleen Borne, about pts GI  issues and PEG placement and also about poor overall prognosis of pt.  He mentioned consideration of Hospice or Palliative care but daughter felt that pt would be a candidate for rehab if he could get a trache.  He recommended against an open laparotomy for feeding tube placement due to pts comorbid conditions.  A Family Conference was held with Dr Perrin Maltese and Dr Delana Meyer and a decision was made not to perform any major surgery at this time.    Then, on 3/5, a Code Blue was called due to nonsustained [facility] which was followed by bradycardia down to 30  beats per minute, apnea, and then asystole.  He had return of pulse but went into VTach again and was defibrillated x1 with recovery.  Amiodarone was started and continued.  Family was spoken with and they stated that due to 'personal beliefs' that they want pt to remain FULL CODE. Critical Care team again spoke with patient's daughter and family stated that they 'want to go to the max with treatment' for pt.    On 3/6, an ulcer that had been stage ii on his sacrum was noted to have worsened.  It had gone to 6 cm x 5 cm across the sacrum and bilateral upper buttocks with a central devitalized area of 3 cm x 2.5 cm.  Foul odor was noted.  Santyl was ordered by wound nurse. An insulin drip was added due to continued TPN administration with associated hyperglycemia.  Dr Humphrey Rolls, cardiology, was consulted for pre-op assessment of risk before trache placement.  Given EF of 41 %on echo, Dr. Humphrey Rolls recommended it was ok to proceed with tracheostomy. Pt underwent trache placement by Dr Tami Ribas, ENT, today (3/7).  Pt now can only open eyes but does not track or follow commands. Follow up films show no continued pneumoperitoneum.  Interventional radiology will attempt a G tube placement on Wed or Thursday of this week.   Multiple people have had multiple conversations with the pt's son and daughter about goals of care.  Today, the care manager had another conversation with them and they indicated no change in plan for ongoing aggressive care.  Nursing staff informed me that the son was just here and verbalized to nursing that they still want everything possible done. A Palliative Care Consult is requested now to also have this conversation with family.     TODAY'S DECISIONS: I spent over one hour learning about patient's very complicated month long hospital stay.  I cannot have a conversation with family without knowing the details of his recent history and all that has taken place.  Unfortunately, I missed seeing the  son.   I did have a brief talk with pt's daughter, Alleen Borne. I let her know my role in the care of her father and let her know that I am aware of all the many conversations others have had with her and her brother about code status and pts care, etc.  I let her know that I was not going to push for any specific decision on their part, but that I am available to talk and explain things that are going on with pt.   I also was able to find out that pt's prostate cancer was treated surgically about 10 yrs ago at this hospital and she does not think he had follow up although he might have.  And, she let me know that his lung cancer was surgically treated at [facility] about 5 yrs ago and he was  going there yearly for a chest xray and went this year and chest xray was [facility]. She doesn't think he had any chemo or radiation for either the prostate or the lung cancer.  i explained to her that I do not have any evidence at all that he has an active cancer, but that it would be something to think about since that can cause people to have clotting problems.    I think that I should get acquainted with family and listen to them first.  Then, perhaps after IR places or attempts to place a PEG, then we can discuss code status and expectations with the family.  Daughter was somewhat receptive to considering changing code status to DNR--BUT she was not inclined to do so today and feels I need to talk with her brother the way we talked about it today.  I mentioned to her that a big problem now is 'his brain not coming back after he died two days ago and was brought back in body but no so much in his mind'.    I do think we ought to check his old records and consider checking a PSA and looking into his cancer history just in case he could have a recurrence (none seen in alk phos lab and none noted to be suspicious in imaging done to date, however).  Additionally, I think a neuro consult is needed to help educate family about pts  apparent anoxic brain injury.  Will discuss with others in care team.    IMPRESSION Cardiac Arrest on 3/5 (see details above) Acute Resp Failure ---intubated on day of admission 12/06/15 ---he had probably aspirated as there were extensive secretions suctioned upon intubation ---extubated on 2/18 (he began to be more awake and following some commands) ---out of ICU on 2/19 and on BIPAP  ---re-intubated 2/21 due to worsening resp status due to bilat pneumonia ---now also with trache placed 3/7 COPD/ Emphysema as seen in records from CT done at [facility] this past year Former Smoker ---quit date not known but daughter said he smoked 1-2 packs daily for almost his entire adult life Septic shock -- due to left leg thrombus necrosis Past H/O lung cancer ---s/p right lung lobectomy Severe PAD ---with prior Right AKA  (2015--following thrombosis of arterial bypass graft ) and prior left Femoral Popliteal Bypass  Occlusive Thrombus of Left Femoral Popliteal Bypass  ---s/p angioplasty of left peroneal artery with distal bypass anastomosis & popliteal artery ---with post op resp distress on 12/06/15 CAD  ---with drug eluting stent placements in 2013 ---s/p CABG X2 ---with an acute NSTEMI here on 2/11-12.  HTN ---with malignant HTN here after shock from sepsis was resolved Dyslipidemia Acute renal failure due to ATN ---partly due to procedure related dye use and volume depletion with baseline Cr 0.83 ---did not require dialysis as he started putting out more urine by 2/15.   Anemia requiring transfusions Pneumonia possibly due to aspiration DM2 Seizure Disorder  Gangrene of right foot Urinary Retention ---had urology consult by Dr. Erlene Quan who rec to keep foley in place while pt in ICU and to start Flomax when able to take po capsules (also to follow up Prostate Cancer hx as outpt).  Prostate Cancer ---daughter says he had his prostate surgically removed and she is unsure if he had  follow up (about 10 yrs ago ? Here) CVA Severe Metabolic Acidosis Anemia --transfused Acute Diastolic CHF  --echo on 6/76 showed EF 55-60% and grade 1 diastolic  dysfunction Acute Ischemic CHF --echo on 3/5 showed EF decreased following NSTEMI down to 35-40% --moderate Mitral Regurg and Tricuspid Regurg was noted.   --four chamber dilation was noted with septal hypokinesis also Severe Pulmonary HTN with pA peak pressure was 67 mm HG Thrombocytopenia Severe Hypernatremia Hypokalemia Persistent Fevers ---antifungal Rx started due to persistent fevers and also due to candida in sputum Metabolic Encephalopathy Perforated Bowel  ---likely due to needle perforation which occurred during a difficult and aborted PEG placement attempt on 3/1.  Possible Anoxic Brain Injury Very severe malnutrition and hypoalbuminemia (Alb =1.6 )  REVIEW OF SYSTEMS:  Patient is not able to provide ROS due to sedation and critical illness  SPIRITUAL SUPPORT SYSTEM: Yes.  SOCIAL HISTORY: Autopopulated information saying pt is a 'never smoker' is inaccurate.  Per pts daughter, pt smoked 1-2 packs per day for most of his adult life.  His wife died a little over a year ago (she was also a 'heavy smoker' just like him).  Pt had emphysema seen on CT at [place] per their records.    LEGAL DOCUMENTS:  None  CODE STATUS: Full code  PAST MEDICAL HISTORY: Past Medical History  Diagnosis Date  . Hyperlipidemia   . Hypertension   . Diabetes mellitus ([place])   . CAD (coronary artery disease)     a. s/p 2 v CABG in 2012 (LIMA-LAD & SVG-OM); b. cath 07/2012 s/p PCI/DES to ostial LCx and OM  . PAD (peripheral artery disease) ([place])     a. s/p right SFA stent; b. s/p right toe amputation 2011; c. s/p right AKA spring 2016  . Seizures ([place])   . Gangrene of foot ([place])   . Wheezing   . Lung cancer ([place])   . Prostate cancer ([place])   . Stroke [facility])     PAST SURGICAL HISTORY:  Past Surgical History  Procedure  Laterality Date  . Right aka  07-10-2014  . Left femoral popliteal bypass    . Right lung lobeectomy    . Coronary artery bypass graft    . Peripheral vascular catheterization Left 12/06/2015    Procedure: Lower Extremity Angiography;  Surgeon: [name], MD;  Location: [place] CV [facility];  Service: Cardiovascular;  Laterality: Left;  . Peripheral vascular catheterization Left 12/06/2015    Procedure: Lower Extremity Intervention;  Surgeon: [name], MD;  Location: [place] [facility];  Service: Cardiovascular;  Laterality: Left;  . Peripheral vascular catheterization N/A 12/05/2015    Procedure: Lower Extremity Angiography;  Surgeon: [name], MD;  Location: [place] [facility];  Service: Cardiovascular;  Laterality: N/A;  . Peripheral vascular catheterization  12/05/2015    Procedure: Lower Extremity Intervention;  Surgeon: [name], MD;  Location: [place] [facility];  Service: Cardiovascular;;  . Amputation Left 12/12/2015    Procedure: AMPUTATION ABOVE KNEE;  Surgeon: [name], MD;  Location: [facility] ORS;  Service: Vascular;  Laterality: Left;  . Peg placement N/A 12/24/2015    Procedure: PERCUTANEOUS ENDOSCOPIC GASTROSTOMY (PEG) PLACEMENT;  Surgeon: Josefine Class, MD;  Location: [facility] ENDOSCOPY;  Service: Endoscopy;  Laterality: N/A;    ALLERGIES:  has No Known Allergies.  MEDICATIONS:  Current Facility-Administered Medications  Medication Dose Route Frequency Provider Last Rate Last Dose  . Marland KitchenTPN (CLINIMIX-E) Adult   Intravenous Continuous TPN Flora Lipps, MD 83 mL/hr at 12/29/15 1802    . Marland KitchenTPN (CLINIMIX-E) Adult   Intravenous Continuous TPN Flora Lipps, MD      .  acetaminophen (TYLENOL) suppository 650 mg  650 mg Rectal Q6H PRN Flora Lipps, MD   650 mg at 12/26/15 1937   Or  . acetaminophen (TYLENOL) tablet 650 mg  650 mg Oral Q6H PRN Flora Lipps, MD      . amiodarone (NEXTERONE PREMIX) 360 MG/200ML (1.8 mg/mL) IV infusion  30 mg/hr Intravenous  Continuous Loney Hering, MD 16.7 mL/hr at 12/30/15 1522 30 mg/hr at 12/30/15 1522  . anidulafungin (ERAXIS) 100 mg in sodium chloride 0.9 % 100 mL IVPB  100 mg Intravenous Q24H Adrian Prows, MD   100 mg at 12/29/15 1802  . antiseptic oral rinse solution (CORINZ)  7 mL Mouth Rinse 10 times per day Wilhelmina Mcardle, MD   7 mL at 12/30/15 1450  . chlorhexidine gluconate (PERIDEX) 0.12 % solution 15 mL  15 mL Mouth Rinse BID Wilhelmina Mcardle, MD   15 mL at 12/30/15 1042  . collagenase (SANTYL) ointment   Topical Daily Adrian Prows, MD      . fentaNYL (SUBLIMAZE) 2,500 mcg in sodium chloride 0.9 % 250 mL (10 mcg/mL) infusion  25-400 mcg/hr Intravenous Continuous Lenis Noon, RPH 25 mL/hr at 12/30/15 0605 250 mcg/hr at 12/30/15 0605  . ibuprofen (ADVIL,MOTRIN) 100 MG/5ML suspension 400 mg  400 mg Per Tube Q6H PRN Mikael Spray, NP   400 mg at 12/21/15 1143  . insulin glargine (LANTUS) injection 12 Units  12 Units Subcutaneous Once Charlett Nose, [facility]      . insulin regular (NOVOLIN R,HUMULIN R) 250 Units in sodium chloride 0.9 % 250 mL (1 Units/mL) infusion   Intravenous Continuous Flora Lipps, MD 2.6 mL/hr at 12/30/15 0743    . ipratropium-albuterol (DUONEB) 0.5-2.5 (3) MG/3ML nebulizer solution 3 mL  3 mL Nebulization Q4H PRN Laverle Hobby, MD   3 mL at 12/15/15 2216  . midazolam (VERSED) injection 2-5 mg  2-5 mg Intravenous Q1H PRN Tanda Rockers, MD   4 mg at 12/27/15 2346  . norepinephrine (LEVOPHED) 4 mg in dextrose 5 % 250 mL (0.016 mg/mL) infusion  0-40 mcg/min Intravenous Titrated Flora Lipps, MD   Stopped at 12/26/15 2151  . [START ON 12/31/2015] pantoprazole (PROTONIX) injection 40 mg  40 mg Intravenous Q24H Charlett Nose, RPH      . piperacillin-tazobactam (ZOSYN) IVPB 3.375 g  3.375 g Intravenous 3 times per day Charlett Nose, RPH   3.375 g at 12/30/15 1413  . vancomycin (VANCOCIN) IVPB 1000 mg/200 mL premix  1,000 mg Intravenous Q18H Wilhelmina Mcardle, MD   1,000  mg at 12/29/15 2305    Vital Signs: BP 117/71 mmHg  Pulse 102  Temp(Src) 98.2 F (36.8 C) (Other (Comment))  Resp 15  Ht 6' 4"  (1.93 m)  Wt 62.6 kg (138 lb 0.1 oz)  BMI 16.81 kg/m2  SpO2 99% Filed Weights   12/28/15 0500 12/29/15 0409 12/30/15 0500  Weight: 61 kg (134 lb 7.7 oz) 60.7 kg (133 lb 13.1 oz) 62.6 kg (138 lb 0.1 oz)    Estimated body mass index is 16.81 kg/(m^2) as calculated from the following:   Height as of this encounter: 6' 4"  (1.93 m).   Weight as of this encounter: 62.6 kg (138 lb 0.1 oz).  PERFORMANCE STATUS (ECOG) : 4 - Bedbound  PHYSICAL EXAM: Sedated and intubated via new trache  --trache just placed this am Eyes closed No JVD or TM Hrt rrr no m Lungs with ronchi and vent sounds Abd soft and NT  Ext no mottling or cyanosis Skin warm and dry  LABS: CBC:    Component Value Date/Time   WBC 14.7* 12/30/2015 0452   WBC 4.9 10/25/2014 1331   HGB 9.0* 12/30/2015 0452   HGB 12.1* 10/25/2014 1331   HCT 29.0* 12/30/2015 0452   HCT 38.1* 10/25/2014 1331   PLT 326 12/30/2015 0452   PLT 151 10/25/2014 1331   MCV 91.0 12/30/2015 0452   MCV 86 10/25/2014 1331   NEUTROABS 16.6* 12/10/2015 0816   NEUTROABS 2.8 10/25/2014 1331   LYMPHSABS 0.6* 12/10/2015 0816   LYMPHSABS 1.6 10/25/2014 1331   MONOABS 1.3* 12/10/2015 0816   MONOABS 0.4 10/25/2014 1331   EOSABS 0.1 12/10/2015 0816   EOSABS 0.2 10/25/2014 1331   BASOSABS 0.0 12/10/2015 0816   BASOSABS 0.0 10/25/2014 1331   Comprehensive Metabolic Panel:    Component Value Date/Time   NA 146* 12/30/2015 0452   NA 138 10/25/2014 1331   K 4.5 12/30/2015 0452   K 3.9 10/25/2014 1331   CL 114* 12/30/2015 0452   CL 109* 10/25/2014 1331   CO2 28 12/30/2015 0452   CO2 21 10/25/2014 1331   BUN 36* 12/30/2015 0452   BUN 17 10/25/2014 1331   CREATININE 1.08 12/30/2015 0452   CREATININE 1.11 10/25/2014 1331   GLUCOSE 190* 12/30/2015 0452   GLUCOSE 232* 10/25/2014 1331   CALCIUM 8.2* 12/30/2015 0452    CALCIUM 8.7 10/25/2014 1331   AST 45* 12/28/2015 0012   ALT 29 12/28/2015 0012   ALKPHOS 96 12/28/2015 0012   BILITOT 0.7 12/28/2015 0012   PROT 5.7* 12/28/2015 0012   ALBUMIN 1.6* 12/28/2015 0012   More than 50% of the visit was spent in counseling/coordination of care: Yes  Time Spent: 80 minutes

## 2015-12-30 NOTE — Progress Notes (Signed)
12/30/2015 1:14 PM  Stephen Camacho, Stephen Camacho [id number]  Post-Op Day 0    Temp:  [97.9 F (36.6 C)-100 F (37.8 C)] 97.9 F (36.6 C) (03/07 1200) Resp:  [13-20] 20 (03/07 1200) BP: (98-141)/(62-85) 110/67 mmHg (03/07 1200) SpO2:  [97 %-100 %] 99 % (03/07 1221) FiO2 (%):  [30 %] 30 % (03/07 1221) Weight:  [62.6 kg (138 lb 0.1 oz)] 62.6 kg (138 lb 0.1 oz) (03/07 0500),     Intake/Output Summary (Last 24 hours) at 12/30/15 1314 Last data filed at 12/30/15 0949  Gross per 24 hour  Intake 2990.4 ml  Output   1150 ml  Net 1840.4 ml    Results for orders placed or performed during the hospital encounter of 12/05/15 (from the past 24 hour(s))  Glucose, capillary     Status: Abnormal   Collection Time: 12/29/15  2:03 PM  Result Value Ref Range   Glucose-Capillary 282 (H) 65 - 99 mg/dL  Glucose, capillary     Status: Abnormal   Collection Time: 12/29/15  2:59 PM  Result Value Ref Range   Glucose-Capillary 238 (H) 65 - 99 mg/dL  Heparin level (unfractionated)     Status: Abnormal   Collection Time: 12/29/15  3:29 PM  Result Value Ref Range   Heparin Unfractionated 0.17 (L) 0.30 - 0.70 IU/mL  Glucose, capillary     Status: Abnormal   Collection Time: 12/29/15  3:59 PM  Result Value Ref Range   Glucose-Capillary 218 (H) 65 - 99 mg/dL  Glucose, capillary     Status: Abnormal   Collection Time: 12/29/15  4:58 PM  Result Value Ref Range   Glucose-Capillary 182 (H) 65 - 99 mg/dL  Glucose, capillary     Status: Abnormal   Collection Time: 12/29/15  5:57 PM  Result Value Ref Range   Glucose-Capillary 170 (H) 65 - 99 mg/dL  Glucose, capillary     Status: Abnormal   Collection Time: 12/29/15  7:06 PM  Result Value Ref Range   Glucose-Capillary 140 (H) 65 - 99 mg/dL  Glucose, capillary     Status: Abnormal   Collection Time: 12/29/15  7:55 PM  Result Value Ref Range   Glucose-Capillary 115 (H) 65 - 99 mg/dL   Comment 1 Notify RN   Glucose, capillary     Status: None   Collection Time:  12/29/15  9:00 PM  Result Value Ref Range   Glucose-Capillary 95 65 - 99 mg/dL   Comment 1 Document in Chart   Glucose, capillary     Status: Abnormal   Collection Time: 12/29/15 10:02 PM  Result Value Ref Range   Glucose-Capillary 113 (H) 65 - 99 mg/dL   Comment 1 Notify RN   Heparin level (unfractionated)     Status: Abnormal   Collection Time: 12/29/15 10:25 PM  Result Value Ref Range   Heparin Unfractionated 0.21 (L) 0.30 - 0.70 IU/mL  Glucose, capillary     Status: Abnormal   Collection Time: 12/29/15 11:02 PM  Result Value Ref Range   Glucose-Capillary 120 (H) 65 - 99 mg/dL   Comment 1 Notify RN   Glucose, capillary     Status: Abnormal   Collection Time: 12/29/15 11:53 PM  Result Value Ref Range   Glucose-Capillary 144 (H) 65 - 99 mg/dL   Comment 1 Notify RN   Glucose, capillary     Status: Abnormal   Collection Time: 12/30/15 12:58 AM  Result Value Ref Range   Glucose-Capillary 168 (H) 65 - 99  mg/dL   Comment 1 Notify RN   Glucose, capillary     Status: Abnormal   Collection Time: 12/30/15  1:54 AM  Result Value Ref Range   Glucose-Capillary 185 (H) 65 - 99 mg/dL   Comment 1 Notify RN   Glucose, capillary     Status: Abnormal   Collection Time: 12/30/15  2:58 AM  Result Value Ref Range   Glucose-Capillary 202 (H) 65 - 99 mg/dL  Glucose, capillary     Status: Abnormal   Collection Time: 12/30/15  4:18 AM  Result Value Ref Range   Glucose-Capillary 175 (H) 65 - 99 mg/dL  Basic metabolic panel     Status: Abnormal   Collection Time: 12/30/15  4:52 AM  Result Value Ref Range   Sodium 146 (H) 135 - 145 mmol/L   Potassium 4.5 3.5 - 5.1 mmol/L   Chloride 114 (H) 101 - 111 mmol/L   CO2 28 22 - 32 mmol/L   Glucose, Bld 190 (H) 65 - 99 mg/dL   BUN 36 (H) 6 - 20 mg/dL   Creatinine, Ser 1.08 0.61 - 1.24 mg/dL   Calcium 8.2 (L) 8.9 - 10.3 mg/dL   GFR calc non Af Amer >60 >60 mL/min   GFR calc Af Amer >60 >60 mL/min   Anion gap 4 (L) 5 - 15  Magnesium     Status: None    Collection Time: 12/30/15  4:52 AM  Result Value Ref Range   Magnesium 1.9 1.7 - 2.4 mg/dL  Phosphorus     Status: None   Collection Time: 12/30/15  4:52 AM  Result Value Ref Range   Phosphorus 3.7 2.5 - 4.6 mg/dL  CBC     Status: Abnormal   Collection Time: 12/30/15  4:52 AM  Result Value Ref Range   WBC 14.7 (H) 3.8 - 10.6 K/uL   RBC 3.19 (L) 4.40 - 5.90 MIL/uL   Hemoglobin 9.0 (L) 13.0 - 18.0 g/dL   HCT 29.0 (L) 40.0 - 52.0 %   MCV 91.0 80.0 - 100.0 fL   MCH 28.2 26.0 - 34.0 pg   MCHC 31.0 (L) 32.0 - 36.0 g/dL   RDW 18.4 (H) 11.5 - 14.5 %   Platelets 326 150 - 440 K/uL  Glucose, capillary     Status: Abnormal   Collection Time: 12/30/15  5:15 AM  Result Value Ref Range   Glucose-Capillary 146 (H) 65 - 99 mg/dL  Glucose, capillary     Status: Abnormal   Collection Time: 12/30/15  5:58 AM  Result Value Ref Range   Glucose-Capillary 159 (H) 65 - 99 mg/dL   Comment 1 Notify RN   Glucose, capillary     Status: Abnormal   Collection Time: 12/30/15  7:13 AM  Result Value Ref Range   Glucose-Capillary 129 (H) 65 - 99 mg/dL  Glucose, capillary     Status: Abnormal   Collection Time: 12/30/15  9:52 AM  Result Value Ref Range   Glucose-Capillary 131 (H) 65 - 99 mg/dL  Glucose, capillary     Status: Abnormal   Collection Time: 12/30/15 11:18 AM  Result Value Ref Range   Glucose-Capillary 173 (H) 65 - 99 mg/dL  Glucose, capillary     Status: Abnormal   Collection Time: 12/30/15 12:53 PM  Result Value Ref Range   Glucose-Capillary 186 (H) 65 - 99 mg/dL    SUBJECTIVE:  S/P trache  OBJECTIVE:  Trache clean and dry functioning  IMPRESSION:  S/P trache  PLAN:  Routine trache care  Lynsay Fesperman T 12/30/2015, 1:14 PM

## 2015-12-30 NOTE — Progress Notes (Addendum)
Palliative Care Update  Consult is initiated.  Very complicated pt history.    See full note to follow.   Kirby Funk, MD

## 2015-12-30 NOTE — Progress Notes (Signed)
[facility]  Daily Progress Note   Subjective    The patient has again sustained a cardiac arrest this weekend. He does open eyes but does not track or follow commands he remains intubated he did have successful tracheostomy placed today  Objective Filed Vitals:   12/30/15 1400 12/30/15 1500 12/30/15 1600 12/30/15 1621  BP: 108/65 117/71 108/68   Pulse:      Temp: 98.1 F (36.7 C) 98.2 F (36.8 C) 98.2 F (36.8 C)   TempSrc:      Resp: '15 15 15   '$ Height:      Weight:      SpO2: 99% 99% 99% 97%    Intake/Output Summary (Last 24 hours) at 12/30/15 1757 Last data filed at 12/30/15 1434  Gross per 24 hour  Intake 2422.7 ml  Output   1550 ml  Net  872.7 ml    PULM  new tracheostomy present no bleeding, patient is ventilated , no use of accessory muscles CV  No JVD, RRR Abd      No distended, nontender VASC  left above-knee amputation stump clean dry and intact  Laboratory CBC    Component Value Date/Time   WBC 14.7* 12/30/2015 0452   WBC 4.9 10/25/2014 1331   HGB 9.0* 12/30/2015 0452   HGB 12.1* 10/25/2014 1331   HCT 29.0* 12/30/2015 0452   HCT 38.1* 10/25/2014 1331   PLT 326 12/30/2015 0452   PLT 151 10/25/2014 1331    BMET    Component Value Date/Time   NA 146* 12/30/2015 0452   NA 138 10/25/2014 1331   K 4.5 12/30/2015 0452   K 3.9 10/25/2014 1331   CL 114* 12/30/2015 0452   CL 109* 10/25/2014 1331   CO2 28 12/30/2015 0452   CO2 21 10/25/2014 1331   GLUCOSE 190* 12/30/2015 0452   GLUCOSE 232* 10/25/2014 1331   BUN 36* 12/30/2015 0452   BUN 17 10/25/2014 1331   CREATININE 1.08 12/30/2015 0452   CREATININE 1.11 10/25/2014 1331   CALCIUM 8.2* 12/30/2015 0452   CALCIUM 8.7 10/25/2014 1331   GFRNONAA >60 12/30/2015 0452   GFRNONAA >60 10/25/2014 1331   GFRNONAA 52* 07/08/2014 1051   GFRAA >60 12/30/2015 0452   GFRAA >60 10/25/2014 1331   GFRAA >60 07/08/2014 1051    Assessment/Planning:    Family continues to wish  everything be done patient is therefore had successful tracheostomy today and later this week we will address the issue of enteral feeds and/or a G-tube. His overall continued condition is grave his multiple cardiac events and his elevated white count suggest that his prognosis is quite poor family has been informed of this.    Schnier, [name]  12/30/2015, 5:57 PM

## 2015-12-30 NOTE — Progress Notes (Signed)
ICU nurse called-to talk with MD and call Radiology nurse back concerning possible IR Gastrostomy tube insertion this week.-+

## 2015-12-30 NOTE — Progress Notes (Signed)
[facility] [place] NAME: Stephen Camacho    MR#:  [id number]  DATE OF BIRTH:  1946/09/08  SUBJECTIVE:   Patient here due to a prolonged hospital course status post left femoropopliteal bypass status post thrombolysis, thrombectomy and angioplasty with residual thrombosis, status post sepsis from left lower extremity gangrene end STEMI, Acute respiratory failure due to pneumonia, perforated viscus from post PEG placement, recent episode of PE a cardiac arrest.   S/p Tracheostomy today.  Remains on TPN.  Opens eyes but does not follow commands.    REVIEW OF SYSTEMS:    Review of Systems  Unable to perform ROS: intubated    Nutrition: TPN Tolerating Diet: no due to perforated viscus Tolerating PT: AWait eval.    DRUG ALLERGIES:  No Known Allergies  VITALS:  Blood pressure 100/63, pulse 102, temperature 98.1 F (36.7 C), temperature source Other (Comment), resp. rate 15, height '6\' 4"'$  (1.93 m), weight 62.6 kg (138 lb 0.1 oz), SpO2 99 %.  PHYSICAL EXAMINATION:   Physical Exam  GENERAL:  70 y.o.-year-old critically ill patient lying in the bed in NAD.  EYES: Pupils equal, round, reactive to light. No scleral icterus.  HEENT: Head atraumatic, normocephalic. ET and OG tube in place.   NECK:  Supple, no jugular venous distention. Trach in place.  LUNGS: Normal breath sounds bilaterally, no wheezing, rales, rhonchi. No use of accessory muscles of respiration.  CARDIOVASCULAR: S1, S2 normal. No murmurs, rubs, or gallops.  ABDOMEN: Soft, nondistended. Hypoactive BS. No organomegaly or mass.  EXTREMITIES: No cyanosis, clubbing or edema b/l.   B/L AKA.  Left AKA with staples in place and wound clean. NEUROLOGIC: Sedated & intubated. Does not follow any commands.  Eyes Open PSYCHIATRIC: Sedated & intubated.  Does not follow any commands.  SKIN: No obvious rash, lesion, or ulcer.    LABORATORY PANEL:   CBC  Recent Labs Lab  12/30/15 0452  WBC 14.7*  HGB 9.0*  HCT 29.0*  PLT 326   ------------------------------------------------------------------------------------------------------------------  Chemistries   Recent Labs Lab 12/28/15 0012  12/30/15 0452  NA 145  < > 146*  K 3.2*  < > 4.5  CL 109  < > 114*  CO2 24  < > 28  GLUCOSE 600*  < > 190*  BUN 29*  < > 36*  CREATININE 1.26*  < > 1.08  CALCIUM 7.4*  < > 8.2*  MG  --   < > 1.9  AST 45*  --   --   ALT 29  --   --   ALKPHOS 96  --   --   BILITOT 0.7  --   --   < > = values in this interval not displayed. ------------------------------------------------------------------------------------------------------------------  Cardiac Enzymes  Recent Labs Lab 12/28/15 0012  TROPONINI 0.27*   ------------------------------------------------------------------------------------------------------------------  RADIOLOGY:  Dg Abd 1 View  12/30/2015  CLINICAL DATA:  Check OG tube placement EXAM: ABDOMEN - 1 VIEW COMPARISON:  12/27/15 FINDINGS: Gastric catheter is noted within the stomach. Stable bibasilar changes are noted. Contrast is noted within the colon from previous CT examination. IMPRESSION: Catheter within the stomach as described. Electronically Signed   By: Inez Catalina M.D.   On: 12/30/2015 11:41     ASSESSMENT AND PLAN:   70 year old male with past history of peripheral vascular disease, hypertension, hyperlipidemia, diabetes, history of seizures, history of prostate cancer, CVA who is admitted to the hospital for a left femoral pop bypass  graft status post thrombolysis/thrombectomy/angioplasty and postoperatively has had complications due to left lower extremity gangrene with sepsis, acute respiratory failure with hypoxia due to pneumonia, and STEMI, perforated viscus status post PEG placement, and recent PEA cardiac arrest.  #1 PEA cardiac arrest-as per Cards due to underlying CAD/MI.  - Echo showing EF of 41% w/ septal hypokinesis.  Not a  candidate for urgent intervention as he is critically ill. - conservative treatment for now.  Cont. Amio gtt.   #2 Acute Resp Failure - cont. Care as per Pulmonary.  - difficult to wean and s/p Tracheostomy today.  - Continue vancomycin, Zosyn for pneumonia  #3 Perforated viscus post PEG tube placement - no plans for surgical intervention as he is critically ill - cont. TPN - cont. Empiric abx. As mentioned above. -Possible plan for CT scan of abdomen pelvis later this week to further evaluate this.  #4. NSETMI - cont. Medical management due to critical illness.   #5. S/p Fem-pop bypass w/ thrombolysis - cont. Care as per Vascular.   #6 COPD - PRN duonebs.   Palliative CAre consult to discuss goals of care as pt's prognosis is very poor.    All the records are reviewed and case discussed with Care Management/Social Workerr. Management plans discussed with the patient, family and they are in agreement.  CODE STATUS: FULL  DVT Prophylaxis: heparin gtt  TOTAL TIME TAKING CARE OF THIS PATIENT: 25 minutes.   POSSIBLE D/C  unclear, DEPENDING ON CLINICAL CONDITION and hospital course.    Henreitta Leber M.D on 12/30/2015 at 2:01 PM  Between 7am to 6pm - Pager - [phone]  After 6pm go to [email or web address] - password [email or web address] [facility]  Office  [phone]  CC: Primary care physician; Pcp Not In System

## 2015-12-30 NOTE — Op Note (Signed)
12/30/2015 9:29 AM  Johnnette Litter [id number]  Pre-Op Dx: prolonged intubation Post-Op Dx:  Same  Proc:  Tracheostomy  Surg:  Beverly Gust T  Anes:  GOT  EBL:  < 10cc  Comp:  none  Findings:  Normal anterior neck anatomy  Procedure:  The patient was brought from the intensive care unit to the operating room and transferred to an operating table.  Anesthesia was administered per indwelling orotracheal tube.   Neck extension was achieved as possible anda shoulder rolke was placed.  The lower neck was palpated with the findings as described above.  1% Xylocaine with 1:100,000 epinephrine, 4 cc's, was infiltrated into the surgical field for intraoperative hemostasis.  Several minutes were allowed for this to take effect. The patient was prepped in a sterile fashion with a surgical prep from the chin down to the upper chest.  Sterile draping was accomplished in the standard fashion.  A  3 cm horizontal incision was made sharply a finger's breadth above the sternal notch, and extended through skin and subcutaneous fat.  Using cautery, the superficial layer of the deep cervical fascia was lysed.  Additional dissection revealed the strap muscles.  The midline raphe was divided in two layers and the muscles retracted laterally.  The pretracheal plane was visualized.  This was entered bluntly.  The thyroid isthmus was isolated and divided with the Harmonic scalpel.  The thyroid gland was retracted to either side.  The anterior face of the trachea was cleared. There was what appeared to be a double first tracheal ring which was approximately 2cm in height. In the  2nd interspace, a transverse incision was made between cartilage rings into the tracheal lumen.  A 6 mm wide inferiorly based flap was generated and secured to the lower wound with a 4-0 vicryl suture.   A previously tested  # 8 Shiley cuffed tracheostomy tube was brought into the field.  With the endotracheal tube under direct  visualization through the tracheostomy, it was gently backed up.  The tracheostomy tube was inserted into the tracheal lumen.  Hemostasis was observed. The cuff was inflated and observed to be intact and containing pressure. The inner cannula was placed and ventilation assumed per tracheostomy tube.  Good chest wall motion was observed, and CO2 was documented per anesthesia.  The trach tube was secured in the standard fashion with trach ties. A 2-0 Silk suture was used to secure the trach tube to the skin on both sides.  Hemostasis was observed again.  When satisfactory ventilation was assured, the orotracheal tube was removed.  At this point the procedure was completed.  The patient was returned to anesthesia, awakened as possible, and transferred back to the intensive care unit in stable condition.  Comment: 70 y.o. male with prolonged ventilation was the indication for today's procedure.  Anticipate a routine postoperative recovery including standard tracheal hygiene.  The sutures should be removed in 5 days.  When the patient no longer requires ventilator or pressure support, the cuff should be deflated.  Changing to an uncuffed tube and downsizing will be according to the clinical condition of the patient.   Buffi Ewton T  9:29 AM 12/30/2015

## 2015-12-30 NOTE — Anesthesia Preprocedure Evaluation (Signed)
Anesthesia Evaluation  Patient identified by MRN, date of birth, ID band Patient unresponsive    Reviewed: Allergy & Precautions, NPO status , Patient's Chart, lab work & pertinent test results  Airway Mallampati: Intubated       Dental   Pulmonary former smoker,     + decreased breath sounds      Cardiovascular Exercise Tolerance: Poor hypertension, Pt. on medications and Pt. on home beta blockers + CAD, + Past MI and + Peripheral Vascular Disease   Rhythm:Regular Rate:Tachycardia     Neuro/Psych Seizures -, Well Controlled,     GI/Hepatic   Endo/Other  diabetes, Type 1, Insulin Dependent  Renal/GU DialysisRenal disease     Musculoskeletal   Abdominal (+) + scaphoid   Peds  Hematology   Anesthesia Other Findings   Reproductive/Obstetrics                             Anesthesia Physical Anesthesia Plan  ASA: III  Anesthesia Plan: General   Post-op Pain Management:    Induction: Intravenous  Airway Management Planned: Oral ETT  Additional Equipment:   Intra-op Plan:   Post-operative Plan:   Informed Consent: I have reviewed the patients History and Physical, chart, labs and discussed the procedure including the risks, benefits and alternatives for the proposed anesthesia with the patient or authorized representative who has indicated his/her understanding and acceptance.     Plan Discussed with: CRNA  Anesthesia Plan Comments:         Anesthesia Quick Evaluation

## 2015-12-30 NOTE — Care Management (Signed)
Barrier to discharge- Still uncertain of actual status of ? colonic perforation.  Patient's temp and white count, vital signs do not indicate an acute abdomen at present.  Patient is not stable for transfer or initiate ltac referral until until know specific status of abdomen.  Discussed with [facility]

## 2015-12-30 NOTE — Progress Notes (Signed)
70 year old multiple medical issues due to ones being prolonged hospital stay and cardiac arrest twice during this hospitalization.  Patient does have some grimace to his sternal rales however no other neurologic function. My partners Dr. Burt Knack and Dr. Dahlia Byes has seen the patient spoken with both the patient's family as well as participated in a family conference with palliative care which recommended that patient not have any major surgeries to include an open or laparoscopic G-tube at this time.   Filed Vitals:   12/30/15 1900 12/30/15 2000  BP: 108/60 103/62  Pulse:    Temp: 98.2 F (36.8 C) 98.1 F (36.7 C)  Resp: 15 15   I/O last 3 completed shifts: In: 3847.6 [I.V.:1805.6; IV Piggyback:50] Out: 2825 [Urine:2820; Blood:5] Total I/O In: 50 [I.V.:50] Out: -    PE: Gen: Grimace to strong sternal rub, no respond to deep palpation in other parts of the body Neck: trach in place with some brown secrection Res: creackles in bilateral bases, decreased somewhat on the right side Cardio: tachycardia Abd: soft, non-tender, no masses Ext: B/L AKA   A/P 1. Given the concern with the previously attempted PEG would recommend a fluoroscopy guided G-tube which can be done at any time radiology deems appropriate.  He would not be candidate for open or laparoscopy G tube placement 2.  Would NOT get barium enema, patient's WBC is going down and non-tender to my exam on abdomen, if there was an appreciable perforation and not just a pinpoint hole that the body likely has closed off, could risk making the problem worse 3.  The patient's condition continues to remain grave without hope of meaningful neurologic recovery which has been relayed to the family.

## 2015-12-30 NOTE — Anesthesia Procedure Notes (Signed)
Date/Time: 12/30/2015 8:41 AM Performed by: Doreen Salvage Comments: Patient arrived to the OR intubated. Patient attached to the ventilator

## 2015-12-30 NOTE — Progress Notes (Signed)
Mr. Olund remains on ventilator. Trach placed this morning without any difficulties. VSS. He is still on insulin drip at 3.3 units an hour. Opens eyes but does not follow commands. TPN , and amiodarone still infusing. Afebrile.

## 2015-12-30 NOTE — Progress Notes (Signed)
Discussed with Dr. Delana Meyer earlier and we will sign off patient for now.   Please call us back if any further help needed.  Thank you.

## 2015-12-30 NOTE — Care Management (Signed)
Confirmed with patient's son Stephen Camacho that family still wish to move forward with aggressive plan of care and ltac

## 2015-12-30 NOTE — Transfer of Care (Signed)
Immediate Anesthesia Transfer of Care Note  Patient: Stephen Camacho  Procedure(s) Performed: Procedure(s): TRACHEOSTOMY (N/A)  Patient Location: ICU 1  Anesthesia Type:General  Level of Consciousness: sedated  Airway & Oxygen Therapy: Patient Spontanous Breathing and Patient connected to face mask oxygen  Post-op Assessment: Report given to RN and Post -op Vital signs reviewed and stable  Post vital signs: Reviewed and stable  Last Vitals:  Filed Vitals:   12/30/15 0800 12/30/15 0941  BP: 134/80 98/63  Pulse:    Temp: 37.2 C 36.9 C  Resp: 15 16    Complications: No apparent anesthesia complications

## 2015-12-30 NOTE — Progress Notes (Signed)
Pharmacy Anti-infective Note  Stephen Camacho is a 69 y.o. male admitted on 12/05/2015 with pneumonia.  Pharmacy has been consulted for vancomycin, Zosyn, and anidulafungin dosing.   Plan: Vancomycin 1 g IV q18 hours. Will obtain follow up trough prior to 1100 dose on 3/7.    Zosyn: Continue Zosyn 3.375 g EI q 8 hours.   Anidulafungin '100mg'$  IV Q24hr.    Pharmacy will continue to monitor and adjust per consult.   Height: '6\' 4"'$  (193 cm) Weight: 138 lb 0.1 oz (62.6 kg) IBW/kg (Calculated) : 86.8  Temp (24hrs), Avg:99.1 F (37.3 C), Min:97.9 F (36.6 C), Max:100 F (37.8 C)   Recent Labs Lab 12/26/15 0446  12/27/15 0419 12/28/15 0012 12/28/15 0300 12/28/15 0519 12/28/15 1130 12/28/15 1732 12/29/15 0455 12/30/15 0452  WBC  --   --  18.5* 16.7*  --  17.8*  --  16.6* 15.7* 14.7*  CREATININE  --   < > 1.24 1.26*  --  1.22  --   --  1.24 1.08  LATICACIDVEN  --   --   --  6.0* 2.2*  --   --   --   --   --   VANCOTROUGH 22*  --   --   --   --   --  12  --   --   --   < > = values in this interval not displayed.  Estimated Creatinine Clearance: 56.4 mL/min (by C-G formula based on Cr of 1.08).    No Known Allergies  Antimicrobials this admission: vancomycin 02/22 >> 02/23 ceftazidime 02/22 >> 02/24 Metronidazole 02/23 >> 02/24 Zosyn 02/24 >> Vancomycin 02/26 >> Anidulafungin 02/27>>  Dose adjustments this admission:   Microbiology results: 02/23 BCx: negative x 2 02/23 UCx: negative  02/21 Sputum: Candida albicans  02/22 MRSA PCR: negative 02/23 TA Candida albicans 02/26 TA moderate growth Candida albicans  Pharmacy will continue to monitor and adjust per consult.    Araseli Sherry L 12/30/2015 3:26 PM

## 2015-12-31 ENCOUNTER — Inpatient Hospital Stay: Payer: PPO

## 2015-12-31 ENCOUNTER — Encounter: Payer: Self-pay | Admitting: Unknown Physician Specialty

## 2015-12-31 DIAGNOSIS — G40909 Epilepsy, unspecified, not intractable, without status epilepticus: Secondary | ICD-10-CM

## 2015-12-31 DIAGNOSIS — I82402 Acute embolism and thrombosis of unspecified deep veins of left lower extremity: Secondary | ICD-10-CM

## 2015-12-31 DIAGNOSIS — E870 Hyperosmolality and hypernatremia: Secondary | ICD-10-CM

## 2015-12-31 DIAGNOSIS — G9341 Metabolic encephalopathy: Secondary | ICD-10-CM

## 2015-12-31 DIAGNOSIS — I252 Old myocardial infarction: Secondary | ICD-10-CM

## 2015-12-31 DIAGNOSIS — Z8673 Personal history of transient ischemic attack (TIA), and cerebral infarction without residual deficits: Secondary | ICD-10-CM

## 2015-12-31 DIAGNOSIS — I739 Peripheral vascular disease, unspecified: Secondary | ICD-10-CM

## 2015-12-31 DIAGNOSIS — I2510 Atherosclerotic heart disease of native coronary artery without angina pectoris: Secondary | ICD-10-CM

## 2015-12-31 DIAGNOSIS — I469 Cardiac arrest, cause unspecified: Secondary | ICD-10-CM

## 2015-12-31 DIAGNOSIS — Z515 Encounter for palliative care: Secondary | ICD-10-CM

## 2015-12-31 DIAGNOSIS — I96 Gangrene, not elsewhere classified: Secondary | ICD-10-CM

## 2015-12-31 DIAGNOSIS — D696 Thrombocytopenia, unspecified: Secondary | ICD-10-CM

## 2015-12-31 DIAGNOSIS — E8809 Other disorders of plasma-protein metabolism, not elsewhere classified: Secondary | ICD-10-CM

## 2015-12-31 DIAGNOSIS — Z85118 Personal history of other malignant neoplasm of bronchus and lung: Secondary | ICD-10-CM

## 2015-12-31 DIAGNOSIS — R579 Shock, unspecified: Secondary | ICD-10-CM

## 2015-12-31 DIAGNOSIS — I10 Essential (primary) hypertension: Secondary | ICD-10-CM

## 2015-12-31 DIAGNOSIS — I5031 Acute diastolic (congestive) heart failure: Secondary | ICD-10-CM

## 2015-12-31 DIAGNOSIS — Z8546 Personal history of malignant neoplasm of prostate: Secondary | ICD-10-CM

## 2015-12-31 DIAGNOSIS — I272 Other secondary pulmonary hypertension: Secondary | ICD-10-CM

## 2015-12-31 DIAGNOSIS — E119 Type 2 diabetes mellitus without complications: Secondary | ICD-10-CM

## 2015-12-31 DIAGNOSIS — D649 Anemia, unspecified: Secondary | ICD-10-CM

## 2015-12-31 DIAGNOSIS — K631 Perforation of intestine (nontraumatic): Secondary | ICD-10-CM

## 2015-12-31 DIAGNOSIS — E43 Unspecified severe protein-calorie malnutrition: Secondary | ICD-10-CM

## 2015-12-31 DIAGNOSIS — R509 Fever, unspecified: Secondary | ICD-10-CM

## 2015-12-31 DIAGNOSIS — E785 Hyperlipidemia, unspecified: Secondary | ICD-10-CM

## 2015-12-31 LAB — BASIC METABOLIC PANEL
Anion gap: 6 (ref 5–15)
BUN: 36 mg/dL — ABNORMAL HIGH (ref 6–20)
CALCIUM: 8 mg/dL — AB (ref 8.9–10.3)
CO2: 26 mmol/L (ref 22–32)
CREATININE: 0.99 mg/dL (ref 0.61–1.24)
Chloride: 112 mmol/L — ABNORMAL HIGH (ref 101–111)
GFR calc Af Amer: >60 mL/min (ref >60)
GLUCOSE: 156 mg/dL — AB (ref 65–99)
Potassium: 4.7 mmol/L (ref 3.5–5.1)
Sodium: 144 mmol/L (ref 135–145)

## 2015-12-31 LAB — GLUCOSE, CAPILLARY
GLUCOSE-CAPILLARY: 197 mg/dL — AB (ref 65–99)
GLUCOSE-CAPILLARY: 131 mg/dL — AB (ref 65–99)
GLUCOSE-CAPILLARY: 132 mg/dL — AB (ref 65–99)
GLUCOSE-CAPILLARY: 104 mg/dL — AB (ref 65–99)
GLUCOSE-CAPILLARY: 102 mg/dL — AB (ref 65–99)
GLUCOSE-CAPILLARY: 132 mg/dL — AB (ref 65–99)
GLUCOSE-CAPILLARY: 158 mg/dL — AB (ref 65–99)
GLUCOSE-CAPILLARY: 178 mg/dL — AB (ref 65–99)
GLUCOSE-CAPILLARY: 180 mg/dL — AB (ref 65–99)
GLUCOSE-CAPILLARY: 164 mg/dL — AB (ref 65–99)
GLUCOSE-CAPILLARY: 148 mg/dL — AB (ref 65–99)
GLUCOSE-CAPILLARY: 152 mg/dL — AB (ref 65–99)
Glucose-Capillary: 121 mg/dL — ABNORMAL HIGH (ref 65–99)
Glucose-Capillary: 125 mg/dL — ABNORMAL HIGH (ref 65–99)
Glucose-Capillary: 126 mg/dL — ABNORMAL HIGH (ref 65–99)
Glucose-Capillary: 137 mg/dL — ABNORMAL HIGH (ref 65–99)
Glucose-Capillary: 168 mg/dL — ABNORMAL HIGH (ref 65–99)
Glucose-Capillary: 168 mg/dL — ABNORMAL HIGH (ref 65–99)
Glucose-Capillary: 170 mg/dL — ABNORMAL HIGH (ref 65–99)
Glucose-Capillary: 193 mg/dL — ABNORMAL HIGH (ref 65–99)
Glucose-Capillary: 214 mg/dL — ABNORMAL HIGH (ref 65–99)
Glucose-Capillary: 217 mg/dL — ABNORMAL HIGH (ref 65–99)

## 2015-12-31 LAB — CBC
HEMATOCRIT: 28.9 % — AB (ref 40.0–52.0)
Hemoglobin: 8.9 g/dL — ABNORMAL LOW (ref 13.0–18.0)
MCH: 28.7 pg (ref 26.0–34.0)
MCHC: 30.9 g/dL — AB (ref 32.0–36.0)
MCV: 92.8 fL (ref 80.0–100.0)
PLATELETS: 250 10*3/uL (ref 150–440)
RBC: 3.12 MIL/uL — ABNORMAL LOW (ref 4.40–5.90)
RDW: 18.7 % — AB (ref 11.5–14.5)
WBC: 13.7 10*3/uL — ABNORMAL HIGH (ref 3.8–10.6)

## 2015-12-31 LAB — MAGNESIUM: Magnesium: 2.1 mg/dL (ref 1.7–2.4)

## 2015-12-31 LAB — PSA: PSA: 0.02 ng/mL (ref 0.00–4.00)

## 2015-12-31 LAB — PHOSPHORUS: Phosphorus: 3.8 mg/dL (ref 2.5–4.6)

## 2015-12-31 LAB — VANCOMYCIN, TROUGH: VANCOMYCIN TR: 22 ug/mL — AB (ref 10–20)

## 2015-12-31 MED ORDER — FREE WATER
30.0000 mL | Status: DC
  Administered 2016-01-02 – 2016-01-04 (×12): 30 mL

## 2015-12-31 MED ORDER — IOHEXOL 180 MG/ML  SOLN
100.0000 mL | Freq: Once | INTRAMUSCULAR | Status: DC | PRN
  Filled 2015-12-31: qty 100

## 2015-12-31 MED ORDER — VITAL HIGH PROTEIN PO LIQD: 1000.0000 mL | ORAL | Status: DC

## 2015-12-31 MED ORDER — TRACE MINERALS CR-CU-MN-SE-ZN 10-1000-500-60 MCG/ML IV SOLN
INTRAVENOUS | Status: AC
  Administered 2015-12-31: 19:00:00 via INTRAVENOUS
  Filled 2015-12-31: qty 1992

## 2015-12-31 MED ORDER — VANCOMYCIN HCL IN DEXTROSE 750-5 MG/150ML-% IV SOLN
750.0000 mg | INTRAVENOUS | Status: DC
Start: 2015-12-31 — End: 2016-01-01
  Administered 2015-12-31 – 2016-01-01 (×2): 750 mg via INTRAVENOUS
  Filled 2015-12-31 (×3): qty 150

## 2015-12-31 MED ORDER — ENOXAPARIN SODIUM 40 MG/0.4ML ~~LOC~~ SOLN
40.0000 mg | SUBCUTANEOUS | Status: DC
  Administered 2015-12-31: 40 mg via SUBCUTANEOUS
  Filled 2015-12-31: qty 0.4

## 2015-12-31 NOTE — Significant Event (Signed)
Critical Result of Chest Xray called by radiologist.  NG tube is located in left lower lobe.  This information was reported to [facility] [facility] who immediately removed the ng tube.

## 2015-12-31 NOTE — Progress Notes (Addendum)
Verbal orders given by Dr.Kasa to skip wakeup assessment. Will continue to assess.

## 2015-12-31 NOTE — Progress Notes (Signed)
Dr.Wohl on call for G.I.,  notified of failure to place OG tube.  Acknowledged no OG placed.  No new orders given. Will continue to assess.

## 2015-12-31 NOTE — Progress Notes (Signed)
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 Now with acute respiratory failure, intubated due to pneumonia. Underwent a left AKA   SUBJECTIVE:  S/p trach, remains on Vent, abd is distended Zosyn/Vanc/antfungal therapy for complication from PEG tube placement s/p bowel/stomach perforation and candida in sputum Prognosis is very poor No issues overnight-will need be evaluated by G tube by Gen sugery and GI and IR   VITAL SIGNS: BP 118/78 mmHg  Pulse 70  Temp(Src) 98.4 F (36.9 C) (Other (Comment))  Resp 16  Ht '6\' 4"'$  (1.93 m)  Wt 138 lb 10.7 oz (62.9 kg)  BMI 16.89 kg/m2  SpO2 96%  HEMODYNAMICS:    VENTILATOR SETTINGS: Vent Mode:  [-] PRVC FiO2 (%):  [30 %] 30 % Set Rate:  [15 bmp] 15 bmp Vt Set:  [500 mL] 500 mL PEEP:  [5 cmH20] 5 cmH20  INTAKE / OUTPUT: I/O last 3 completed shifts: In: 2573.4 [I.V.:1577.4] Out: 2825 [Urine:2820; Blood:5]  PHYSICAL EXAMINATION: General: Chronically ill appearing Neuro:GCS<8T HEENT: NCAT, ETT in place Cardiovascular: RRR, S1/S2, no MRG Lungs: Bilateral airflow, no wheezes Abdomen: no BS, distended abd Ext: Bilateral AKA, Left stump clean and dry  LABS:  BMET  Recent Labs Lab 12/29/15 0455 12/30/15 0452 12/31/15 0458  NA 147* 146* 144  K 4.3 4.5 4.7  CL 114* 114* 112*  CO2 '27 28 26  '$ BUN 33* 36* 36*  CREATININE 1.24 1.08 0.99  GLUCOSE 317* 190* 156*    Electrolytes  Recent Labs Lab 12/29/15 0455 12/30/15 0452 12/31/15 0458  CALCIUM 8.0* 8.2* 8.0*  MG 2.0 1.9 2.1  PHOS 3.3 3.7 3.8    CBC  Recent Labs Lab  12/29/15 0455 12/30/15 0452 12/31/15 0458  WBC 15.7* 14.7* 13.7*  HGB 9.6* 9.0* 8.9*  HCT 31.1* 29.0* 28.9*  PLT 408 326 250    Coag's  Recent Labs Lab 12/28/15 1156  APTT 38*  INR 1.35    Sepsis Markers  Recent Labs Lab 12/28/15 0012 12/28/15 0300  LATICACIDVEN 6.0* 2.2*    ABG  Recent Labs Lab 12/25/15 1000 12/28/15 0416  PHART 7.52* 7.41  PCO2ART 32 47  PO2ART 124* 175*    Liver Enzymes  Recent Labs Lab 12/26/15 1109 12/27/15 0419 12/28/15 0012  AST 20 20 45*  ALT '31 26 29  '$ ALKPHOS 101 111 96  BILITOT 1.1 0.8 0.7  ALBUMIN 1.8* 1.9* 1.6*    Cardiac Enzymes  Recent Labs Lab 12/28/15 0012  TROPONINI 0.27*    Glucose  Recent Labs Lab 12/31/15 0415 12/31/15 0514 12/31/15 0607 12/31/15 0724 12/31/15 0725 12/31/15 0811  GLUCAP 170* 131* 132* 137* 126* 125*   Dg Abd 1 View  12/30/2015  CLINICAL DATA:  Check OG tube placement EXAM: ABDOMEN - 1 VIEW COMPARISON:  12/27/15 FINDINGS: Gastric catheter is noted within the stomach. Stable bibasilar changes are noted. Contrast is noted within the colon from previous CT examination. IMPRESSION: Catheter within the stomach as described. Electronically Signed   By: Inez Catalina M.D.   On: 12/30/2015 11:41   EVENTS/DATA: 02/10 >> LLE angiogram, thrombolysis, thrombectomy, angioplasty 02/11 Intubated for suspected PNA 02/11 elevated  trop I. Pk trop I 62.6 on 02/20 02/12 CT head: New nonhemorrhagic infarcts involving the right PCA territory and bilateral cerebellum, left greater than right 02/17 L AKA 02/18>extubated 02/21 TTE: LVEF 55-60% 02/21 RUE [place]: no DVT 02/21>Re-intubated 02/22 >CT head: Interval evolution of bilateral cerebellar and right PCA territory infarcts 02/27-remains intubated,discussion with family-proceed with trach and PEG 12/24/2015: Attempted EGD and PEG placement resulting in complication of bowel perforation 03/04-Asystole arrest; CPR x 12 minutes with ROSC 03/07 -trach  placed  INDWELLING DEVICES:: ETT 02/11 >> 02/18, 02/21 >>  R IJ CVL 02/10 >> removed PICC line RT  MICRO DATA: MRSA PCR 02/13 >> NEG Urine 02/12 >> NEG Resp 02/11 >> MSSA C diff 02/16 >> NEG Blood 02/12 >> NEG Urine 02/19 >> NEG C diff 02/21 >> NEG Cath tip 02/19 >> NEG Resp 02/18 >> light growth candida Urine 02/21 >> NEG MRSA PCR 02/22 >> NEG Respiratory 02/26>>  ANTIMICROBIALS:  Vanc 02/11 >> 02/23 Pip-tazo 02/11 >> 02/20 Ceftaz 02/21 >> 02/24 Metronidazole 02/21 >> 02/24 Pip-tazo 02/24 >>  Vancomycin 02/25>>  ASSESSMENT / PLAN: 70 YO male with 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 Now with acute respiratory failure, intubated due to pneumonia. Underwent a left AKA 02/17, failed extubation and prolonged ventilator dependence, bowel perforation s/p peg tube placement attempt, and cardiac arrest. Clinical presentation consistent with anoxic brain injury. Given his multiple co-morbidities and frail status, his overall prognosis is very poor.  PULMONARY A: Prolonged VDRF Failed extubation attempt Bilateral pulmonary infiltrates and fever- c/w  PNA P:   -Cont vent support -settings reviewed -Cont vent bundle -Daily SBT if/when meets criteria -Antibiotics as above  -CXR as needed -s/p Trach -remains on vent  CARDIOVASCULAR A:  Cardiac arrest Recurrent NSTEMI PAF, NSR presently NSVT Acute CHF with proBNP>45000; Echo at bedside revealed ejection fraction 41% with moderate to severe hypokinesis of the septum and inferior wall moderate mitral regurgitation and severe pulmonary hypertension with PA systolic pressure 67 mmHg S/p cardiac arrest-prognosis is poor P:  -Heparin gtt stopped -MAP goal > 65 mmHg -Cont ASA -Continue amiodarone  -Hemodynamics per ICU  protocol  RENAL A:   AKI, nonoliguric-improved  Very poor candidate for HD P:   -Monitor BMET intermittently -Monitor I/Os -Correct electrolytes as indicated  GASTROINTESTINAL A:   No issues P:   - PPI -Now with bowel/stomach perforation -Awaiting GI and surgery recs-will place OG to suction-will need to get final status and apporval for G tube placement from GI/GEN SURGERY/IR -Continue TPN  HEMATOLOGIC A:   ICU acquired anemia without acute blood loss P:  -DVT px: SQ heparin -Monitor CBC intermittently -Transfuse per usual guidelines   INFECTIOUS A:   Severe sepsis-persistent leukocytosis; no fever in 24hrs LLE gangrene Aspiration PNA P:   -Vanco and zosyn, antifungal therapy - Repeat cultures if febrile -Tylenol and motrin prn for fever >103 - Follow up ID recs  ENDOCRINE A:   Type 2 DM-Persistent hyperglycemia P:   -on insulin infusion -Continue blood gluocose monitoring per ICU protocol  NEUROLOGIC A:   Multifocal CVAs, acute Acute encewphalopathy H/O seizures - not on chronic AEDs PTA P:   RASS goal: -1, -2 -Fentanyl gtt and prn versed for vent sedation  I have personally obtained a history, examined the patient, evaluated Pertinent laboratory and RadioGraphic/imaging results, and  formulated the assessment  and plan   The Patient requires high complexity decision making for assessment and support, frequent evaluation and titration of therapies, application of advanced monitoring technologies and extensive interpretation of multiple databases. Critical Care Time devoted to patient care services described in this note is 45 minutes.   Overall, patient is critically ill, prognosis is guarded.  Patient with Multiorgan failure and at high risk for cardiac arrest and death.  Very poor chance of meaningful recovery  Corrin Parker, M.D.  [facility]  Medical Director [place] Director [place] Department

## 2015-12-31 NOTE — Progress Notes (Signed)
Nutrition Follow-up    INTERVENTION:   PN: continue TPN at current goal rate EN: received verbal order to start trophic feedings, plan is for dobhoff placement at present. Recommend starting Vital High Protein at rate of 15 ml/hr, no orders for titration at present, assess for tolerance   NUTRITION DIAGNOSIS:   Inadequate oral intake related to inability to eat as evidenced by NPO status. Being addressed via nutrition support  GOAL:   Patient will meet greater than or equal to 90% of their needs  MONITOR:    (Energy Intake, Electrolyte and renal Profile, Anthropometrics, Digestive System, Glucose Profile, Pulmonary Profile)  REASON FOR ASSESSMENT:   Consult New TPN/TNA  ASSESSMENT:   Pt remains on vent, s/p trach  Diet Order:  .TPN (CLINIMIX-E) Adult .TPN (CLINIMIX-E) Adult  PN: 5%AA/15%Dextrose infusing at 83 ml/hr via PICC  Digestive System: OG in place, last BM 3/3 per documentation  Skin:   (stage II pressure ulcer on sacrum and lip)    Recent Labs Lab 12/29/15 0455 12/30/15 0452 12/31/15 0458  NA 147* 146* 144  K 4.3 4.5 4.7  CL 114* 114* 112*  CO2 '27 28 26  '$ BUN 33* 36* 36*  CREATININE 1.24 1.08 0.99  CALCIUM 8.0* 8.2* 8.0*  MG 2.0 1.9 2.1  PHOS 3.3 3.7 3.8  GLUCOSE 317* 190* 156*    Glucose Profile:  Recent Labs  12/31/15 0811 12/31/15 0919 12/31/15 1011  GLUCAP 125* 104* 121*   Meds: insulin drip  Height:   Ht Readings from Last 1 Encounters:  12/29/15 '6\' 4"'$  (1.93 m)    Weight:   Wt Readings from Last 1 Encounters:  12/31/15 138 lb 10.7 oz (62.9 kg)   Filed Weights   12/29/15 0409 12/30/15 0500 12/31/15 0508  Weight: 133 lb 13.1 oz (60.7 kg) 138 lb 0.1 oz (62.6 kg) 138 lb 10.7 oz (62.9 kg)    BMI:  Body mass index is 16.89 kg/(m^2).  Estimated Nutritional Needs:   Kcal:  1371 kcals (Ve: 8.7, Tmax: 38.4) using wt of 59 kg, length of 44 inches  Protein:  89-118 g (1.5-2.0 g/kg)   Fluid:  1770-2065 mL (30-35 ml/kg)    EDUCATION NEEDS:   No education needs identified at this time  [place], RD, LDN [phone] Pager  [phone] Weekend/On-Call Pager

## 2015-12-31 NOTE — Progress Notes (Signed)
2nd attempt at [facility] placement by Dana,R.N.. Pt began coughing and had a moderate amount of bright red secretions seen in trach tube.  Dobhoff was removed pt's trach was suctioned out. Respiratory was called in, Dr.Kasa called to bedside.  While at bedside Dr.Kasa gave verbal orders to discontinue attempts to place NG or OG.   No bright red secretions seen at this time.  Will notify Dr.Rein.  Will continue to assess.

## 2015-12-31 NOTE — Progress Notes (Signed)
Pt remains sedated on fentanyl on on vent. Pt is in stable condition at this time. Pt fentanyl increased to 250 mcg/kg/min on my shift for comfort.   Pt is minimally responsive, will open eyes and occasionally lift right arm.   Report given to Tess oncoming nurse.

## 2015-12-31 NOTE — Progress Notes (Signed)
[facility]FECT[facility] PROGRESS NOTE Date of Admission:  12/05/2015     ID: Stephen Camacho is a 70 y.o. male with Active Problems:   Ischemic leg   Acute respiratory failure (HCC)   Ischemia of lower extremity   Altered mental status   Hematoma of neck   Occlusion of arterial bypass graft (HCC)   NSTEMI (non-ST elevated myocardial infarction) ([place])   Severe sepsis (HCC)   Urinary retention   Aspiration into respiratory tract   Encounter for intubation   Pressure ulcer   Abdominal distension   Free intraperitoneal air   Ileus ([place])   Stroke ([place])   Cardiac arrest ([place])   PE (pulmonary embolism)   Subjective: Trached, on TPN No fevers since 3/5  Unsuccessful peg placement  ROS  Unable to obtain  Medications:  Antibiotics Given (last 72 hours)    Date/Time Action Medication Dose Rate   12/28/15 2155 Given   piperacillin-tazobactam (ZOSYN) IVPB 3.375 g 3.375 g 12.5 mL/hr   12/29/15 0525 Given   vancomycin (VANCOCIN) IVPB 1000 mg/200 mL premix 1,000 mg 200 mL/hr   12/29/15 0654 Given   piperacillin-tazobactam (ZOSYN) IVPB 3.375 g 3.375 g 12.5 mL/hr   12/29/15 1454 Given   piperacillin-tazobactam (ZOSYN) IVPB 3.375 g 3.375 g 12.5 mL/hr   12/29/15 2107 Given   piperacillin-tazobactam (ZOSYN) IVPB 3.375 g 3.375 g 12.5 mL/hr   12/29/15 2305 Given   vancomycin (VANCOCIN) IVPB 1000 mg/200 mL premix 1,000 mg 200 mL/hr   12/30/15 0604 Given   piperacillin-tazobactam (ZOSYN) IVPB 3.375 g 3.375 g 12.5 mL/hr   12/30/15 1413 Given   piperacillin-tazobactam (ZOSYN) IVPB 3.375 g 3.375 g 12.5 mL/hr   12/30/15 1917 Given   vancomycin (VANCOCIN) IVPB 1000 mg/200 mL premix 1,000 mg 200 mL/hr   12/30/15 2148 Given   piperacillin-tazobactam (ZOSYN) IVPB 3.375 g 3.375 g 12.5 mL/hr   12/31/15 0525 Given   piperacillin-tazobactam (ZOSYN) IVPB 3.375 g 3.375 g 12.5 mL/hr   12/31/15 1157 Given   vancomycin (VANCOCIN) IVPB 1000 mg/200 mL premix 1,000 mg 200 mL/hr     .  anidulafungin  100 mg Intravenous Q24H  . antiseptic oral rinse  7 mL Mouth Rinse 10 times per day  . chlorhexidine gluconate  15 mL Mouth Rinse BID  . collagenase   Topical Daily  . enoxaparin (LOVENOX) injection  40 mg Subcutaneous Q24H  . feeding supplement (VITAL HIGH PROTEIN)  1,000 mL Per Tube Q24H  . free water  30 mL Per Tube 6 times per day  . pantoprazole (PROTONIX) IV  40 mg Intravenous Q24H  . piperacillin-tazobactam (ZOSYN)  IV  3.375 g Intravenous 3 times per day  . vancomycin  750 mg Intravenous Q18H    Objective: Vital signs in last 24 hours: Temp:  [97.9 F (36.6 C)-99.1 F (37.3 C)] 99.1 F (37.3 C) (03/08 1200) Pulse Rate:  [70] 70 (03/08 0424) Resp:  [15-24] 20 (03/08 1200) BP: (103-149)/(60-88) 149/88 mmHg (03/08 1200) SpO2:  [91 %-100 %] 96 % (03/08 1200) FiO2 (%):  [30 %] 30 % (03/08 1130) Weight:  [62.9 kg (138 lb 10.7 oz)] 62.9 kg (138 lb 10.7 oz) (03/08 0508) Constitutional: trachced, critically ill appearing HENT: anicteric Mouth/Throat: trach in place  R neck IJ -removed.  PICC LUEwnl  Cardiovascular: Normal rate, regular rhythm and normal heart sounds. Pulmonary/Chest: mech BS Abdominal: Soft. Bowel sounds are normal. He exhibits no distension. There is no tenderness.  Lymphadenopathy: He has no cervical adenopathy.  Neurological: intubated  EXT bil BKA - L site is clean with staples Skin: Skin is warm and dry. No rash noted. No erythema.  Psychiatric: intubated  Lab Results  Recent Labs  12/30/15 0452 12/31/15 0458  WBC 14.7* 13.7*  HGB 9.0* 8.9*  HCT 29.0* 28.9*  NA 146* 144  K 4.5 4.7  CL 114* 112*  CO2 28 26  BUN 36* 36*  CREATININE 1.08 0.99    Microbiology: Results for orders placed or performed during the hospital encounter of 12/05/15  Culture, respiratory (NON-Expectorated)     Status: None   Collection Time: 12/06/15  4:05 PM  Result Value Ref Range Status   Specimen Description NASAL SWAB  Final   Special Requests  NONE  Final   Gram Stain RARE WBC SEEN FEW GRAM POSITIVE COCCI   Final   Culture HEAVY GROWTH STAPHYLOCOCCUS AUREUS  Final   Report Status 12/09/2015 FINAL  Final   Organism ID, Bacteria STAPHYLOCOCCUS AUREUS  Final      Susceptibility   Staphylococcus aureus - MIC*    CIPROFLOXACIN 2 INTERMEDIATE Intermediate     GENTAMICIN <=0.5 SENSITIVE Sensitive     OXACILLIN <=0.25 SENSITIVE Sensitive     TETRACYCLINE <=1 SENSITIVE Sensitive     VANCOMYCIN 1 SENSITIVE Sensitive     TRIMETH/SULFA <=10 SENSITIVE Sensitive     RIFAMPIN <=0.5 SENSITIVE Sensitive     Inducible Clindamycin NEGATIVE Sensitive     * HEAVY GROWTH STAPHYLOCOCCUS AUREUS  Culture, blood (Routine X 2) w Reflex to ID Panel     Status: None   Collection Time: 12/07/15 12:09 AM  Result Value Ref Range Status   Specimen Description BLOOD BLOOD RIGHT HAND  Final   Special Requests BOTTLES DRAWN AEROBIC AND ANAEROBIC 10CC  Final   Culture NO GROWTH 6 DAYS  Final   Report Status 12/13/2015 FINAL  Final  Culture, blood (Routine X 2) w Reflex to ID Panel     Status: None   Collection Time: 12/07/15 12:09 AM  Result Value Ref Range Status   Specimen Description BLOOD BLOOD LEFT HAND  Final   Special Requests BOTTLES DRAWN AEROBIC AND ANAEROBIC 10CC  Final   Culture NO GROWTH 6 DAYS  Final   Report Status 12/13/2015 FINAL  Final  Urine culture     Status: None   Collection Time: 12/07/15 12:11 AM  Result Value Ref Range Status   Specimen Description URINE, CLEAN CATCH  Final   Special Requests NONE  Final   Culture NO GROWTH 1 DAY  Final   Report Status 12/08/2015 FINAL  Final  MRSA PCR Screening     Status: None   Collection Time: 12/08/15  9:12 AM  Result Value Ref Range Status   MRSA by PCR NEGATIVE NEGATIVE Final    Comment:        The GeneXpert MRSA Assay (FDA approved for NASAL specimens only), is one component of a comprehensive MRSA colonization surveillance program. It is not intended to diagnose  MRSA infection nor to guide or monitor treatment for MRSA infections.   C difficile quick scan w PCR reflex     Status: None   Collection Time: 12/11/15  1:54 PM  Result Value Ref Range Status   C Diff antigen NEGATIVE NEGATIVE Final   C Diff toxin NEGATIVE NEGATIVE Final   C Diff interpretation Negative for C. difficile  Final  Culture, blood (Routine X 2) w Reflex to ID Panel     Status: None  Collection Time: 12/13/15  9:33 AM  Result Value Ref Range Status   Specimen Description BLOOD LEFT ANTECUBITAL  Final   Special Requests BOTTLES DRAWN AEROBIC AND ANAEROBIC  4CC  Final   Culture NO GROWTH 5 DAYS  Final   Report Status 12/18/2015 FINAL  Final  Culture, blood (Routine X 2) w Reflex to ID Panel     Status: None   Collection Time: 12/13/15  9:41 AM  Result Value Ref Range Status   Specimen Description BLOOD RIGHT ARM  Final   Special Requests BOTTLES DRAWN AEROBIC AND ANAEROBIC  2CC  Final   Culture NO GROWTH 5 DAYS  Final   Report Status 12/18/2015 FINAL  Final  Urine culture     Status: None   Collection Time: 12/13/15 11:01 AM  Result Value Ref Range Status   Specimen Description URINE, CATHETERIZED  Final   Special Requests NONE  Final   Culture NO GROWTH 1 DAY  Final   Report Status 12/14/2015 FINAL  Final  Culture, expectorated sputum-assessment     Status: None   Collection Time: 12/13/15 11:30 AM  Result Value Ref Range Status   Specimen Description TRACHEAL ASPIRATE  Final   Special Requests NONE  Final   Sputum evaluation THIS SPECIMEN IS ACCEPTABLE FOR SPUTUM CULTURE  Final   Report Status 12/13/2015 FINAL  Final  Culture, respiratory (NON-Expectorated)     Status: None   Collection Time: 12/13/15 11:30 AM  Result Value Ref Range Status   Specimen Description TRACHEAL ASPIRATE  Final   Special Requests NONE Reflexed from [id number]  Final   Gram Stain   Final    FEW WBC SEEN FEW GRAM POSITIVE COCCI FEW YEAST FAIR SPECIMEN - 70-80% WBCS    Culture LIGHT  GROWTH CANDIDA ALBICANS  Final   Report Status 12/17/2015 FINAL  Final  Cath Tip Culture     Status: None   Collection Time: 12/14/15 12:47 PM  Result Value Ref Range Status   Specimen Description CATH TIP  Final   Special Requests NONE  Final   Culture NO GROWTH 3 DAYS  Final   Report Status 12/17/2015 FINAL  Final  Urine culture     Status: None   Collection Time: 12/16/15  8:19 AM  Result Value Ref Range Status   Specimen Description URINE, RANDOM  Final   Special Requests Normal  Final   Culture NO GROWTH 1 DAY  Final   Report Status 12/17/2015 FINAL  Final  CULTURE, BLOOD (ROUTINE X 2) w Reflex to PCR ID Panel     Status: None   Collection Time: 12/16/15  8:28 AM  Result Value Ref Range Status   Specimen Description BLOOD LEFT ARM  Final   Special Requests BOTTLES DRAWN AEROBIC AND ANAEROBIC 10ML  Final   Culture NO GROWTH 5 DAYS  Final   Report Status 12/21/2015 FINAL  Final  CULTURE, BLOOD (ROUTINE X 2) w Reflex to PCR ID Panel     Status: None   Collection Time: 12/16/15  8:45 AM  Result Value Ref Range Status   Specimen Description BLOOD RIGHT ARM  Final   Special Requests BOTTLES DRAWN AEROBIC AND ANAEROBIC 10ML  Final   Culture NO GROWTH 5 DAYS  Final   Report Status 12/21/2015 FINAL  Final  C difficile quick scan w PCR reflex     Status: None   Collection Time: 12/16/15  4:26 PM  Result Value Ref Range Status   C Diff  antigen NEGATIVE NEGATIVE Final   C Diff toxin NEGATIVE NEGATIVE Final   C Diff interpretation Negative for C. difficile  Final  Culture, expectorated sputum-assessment     Status: None   Collection Time: 12/16/15  5:32 PM  Result Value Ref Range Status   Specimen Description SPUTUM  Final   Special Requests NONE  Final   Sputum evaluation THIS SPECIMEN IS ACCEPTABLE FOR SPUTUM CULTURE  Final   Report Status 12/16/2015 FINAL  Final  Culture, respiratory (NON-Expectorated)     Status: None   Collection Time: 12/16/15  5:32 PM  Result Value Ref  Range Status   Specimen Description SPUTUM  Final   Special Requests NONE Reflexed from [id number]  Final   Gram Stain   Final    MANY WBC SEEN RARE SQUAMOUS EPITHELIAL CELLS PRESENT FEW YEAST EXCELLENT SPECIMEN - 90-100% WBCS    Culture MODERATE GROWTH CANDIDA ALBICANS  Final   Report Status 12/20/2015 FINAL  Final  MRSA PCR Screening     Status: None   Collection Time: 12/17/15 11:50 AM  Result Value Ref Range Status   MRSA by PCR NEGATIVE NEGATIVE Final    Comment:        The GeneXpert MRSA Assay (FDA approved for NASAL specimens only), is one component of a comprehensive MRSA colonization surveillance program. It is not intended to diagnose MRSA infection nor to guide or monitor treatment for MRSA infections.   Culture, respiratory (NON-Expectorated)     Status: None   Collection Time: 12/18/15 12:15 AM  Result Value Ref Range Status   Specimen Description TRACHEAL ASPIRATE  Final   Special Requests NONE  Final   Gram Stain   Final    GOOD SPECIMEN - 80-90% WBCS RARE SQUAMOUS EPITHELIAL CELLS PRESENT MANY WBC SEEN FEW YEAST    Culture LIGHT GROWTH CANDIDA ALBICANS  Final   Report Status 12/21/2015 FINAL  Final  Urine culture     Status: None   Collection Time: 12/18/15 12:24 AM  Result Value Ref Range Status   Specimen Description URINE, RANDOM  Final   Special Requests NONE  Final   Culture NO GROWTH 1 DAY  Final   Report Status 12/19/2015 FINAL  Final  Culture, blood (Routine X 2) w Reflex to ID Panel     Status: None   Collection Time: 12/18/15  4:29 AM  Result Value Ref Range Status   Specimen Description BLOOD RIGHT HAND  Final   Special Requests BOTTLES DRAWN AEROBIC AND ANAEROBIC 5ML  Final   Culture NO GROWTH 5 DAYS  Final   Report Status 12/23/2015 FINAL  Final  Culture, blood (Routine X 2) w Reflex to ID Panel     Status: None   Collection Time: 12/18/15  4:37 AM  Result Value Ref Range Status   Specimen Description BLOOD LEFT ARM  Final   Special  Requests BOTTLES DRAWN AEROBIC AND ANAEROBIC 10ML  Final   Culture NO GROWTH 5 DAYS  Final   Report Status 12/23/2015 FINAL  Final  Culture, respiratory (NON-Expectorated)     Status: None   Collection Time: 12/21/15  9:29 AM  Result Value Ref Range Status   Specimen Description TRACHEAL ASPIRATE  Final   Special Requests NONE  Final   Gram Stain   Final    MODERATE WBC SEEN FEW YEAST GOOD SPECIMEN - 80-90% WBCS    Culture MODERATE GROWTH CANDIDA ALBICANS  Final   Report Status 12/24/2015 FINAL  Final  Studies/Results: Dg Chest 1 View  12/31/2015  CLINICAL DATA:  Encounter for nasogastric tube placement. EXAM: CHEST 1 VIEW COMPARISON:  12/28/2015 FINDINGS: The enteric tube passes into the left mainstem bronchus projecting into the left lower lobe. Hazy bilateral airspace lung opacities are noted, increased in the right lower lung when compared the prior exam, likely due to airspace edema. Changes from cardiac surgery are stable. Cardiac silhouette is mildly enlarged. Tracheostomy tube, also new from the prior study, is well positioned. IMPRESSION: 1. Enteric feeding tube tip projects in the central left lower lobe bronchus. Critical Value/emergent results were called by telephone at the time of interpretation on 12/31/2015 at 1:46 pm to ICU nurse Everett Graff, who verbally acknowledged these results. 2. New tracheostomy tube is well positioned. 3. Mild increase in airspace opacity in the left lower lung. Persistent left mid to lower lung zone airspace opacity. Findings likely due to asymmetric pulmonary edema with small pleural effusions. Electronically Signed   By: Lajean Manes M.D.   On: 12/31/2015 13:47   Dg Abd 1 View  12/31/2015  CLINICAL DATA:  Encounter for nasogastric tube placement. EXAM: ABDOMEN - 1 VIEW COMPARISON:  December 30, 2015. FINDINGS: Distal tip of feeding tube appears to be in the left mainstem bronchus. No abnormal bowel dilatation is noted. IMPRESSION: Distal tip of feeding  tube appears to be in the left mainstem bronchus. Critical Value/emergent results were called by telephone at the time of interpretation on 12/31/2015 at 1:42 pm to Kathyrn Drown, the patient's nurse, who verbally acknowledged these results and will immediately contact the ordering physician. Electronically Signed   By: Marijo Conception, M.D.   On: 12/31/2015 13:43   Dg Abd 1 View  12/30/2015  CLINICAL DATA:  Check OG tube placement EXAM: ABDOMEN - 1 VIEW COMPARISON:  12/27/15 FINDINGS: Gastric catheter is noted within the stomach. Stable bibasilar changes are noted. Contrast is noted within the colon from previous CT examination. IMPRESSION: Catheter within the stomach as described. Electronically Signed   By: Inez Catalina M.D.   On: 12/30/2015 11:41    Assessment/Plan: Assessment:  Stephen Camacho is a 70 y.o. male ith hx PAD, CAD, DM, admitted with L foot pain and found to have ischemia and occlusion of Fem Pop bypass. 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 Has had persistent fevers and leukocytosis.  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 Has been on abx since admit with zosyn and vanco from 2/11- 2/19. Was off abx for one day 2/20 there restarted ceftazidime and vanco. Cultures with sputum growing MSSA from 2/11 otherwise candida on trach aspirate.  Amputation site looks very good so doubt source of elevated wbc  IJ line removed, Picc place. May have perfed bowel .   Recommendations Cont zosyn for intrabdominal process Can dc vanco  Dc eraxis  Thank you very much for the consult. Will follow with you.  Navarino, [place]   12/31/2015, 2:14 PM

## 2015-12-31 NOTE — Clinical Social Work Note (Signed)
Clinical Social Work Assessment  Patient Details  Name: Stephen Camacho MRN: [id number] Date of Birth: 04/15/1946  Date of referral:  12/31/15               Reason for consult:  Discharge Planning                Permission sought to share information with:    Permission granted to share information::     Name::        Agency::     Relationship::     Contact Information:     Housing/Transportation Living arrangements for the past 2 months:  Single Family Home Source of Information:  Adult Children Patient Interpreter Needed:  None (unable to speak and not alert and on vent) Criminal Activity/Legal Involvement Pertinent to Current Situation/Hospitalization:  No - Comment as needed Significant Relationships:  Adult Children Lives with:  Self Do you feel safe going back to the place where you live?    Need for family participation in patient care:  Yes (Comment)  Care giving concerns:  Patient resides alone   Facilities manager / plan:  CSW spoke with patient's son to introduce self and role of CSW due to patient's length of stay and possible need for Vent/Snf. RN CM is working on trying to get [employer] from [employer] and currently patient is not ready for LTAC level of care due to medical instability. Patient's son: Stephen Camacho: [phone] met with CSW outside of ICU and offered supportive counseling and encouragement. CSW also explained that if patient does not end up going to LTAC, then the next level of care would be vent/snf and CSW explained this level of care to son and answered questions. CSW explained that there potentially could be a possibility for vent/snf placement in [place] as this is the closest vent/snf facility that will take patients. CSW explained that [facility] has a vent/snf level of care but that beds are typically not as available there. CSW also explained that patient's insurance would have to approve that level of care as well. Patient's son  verbalized understanding and requested CSW also update his sister. CSW has left message for the sister: Stephen Camacho: [phone]. Patient's son was going to update her as well.   Employment status:  Retired Nurse, adult PT Recommendations:    Information / Referral to community resources:     Patient/Family's Response to care:  Patient's son expressed appreciation for CSW assistance.  Patient/Family's Understanding of and Emotional Response to Diagnosis, Current Treatment, and Prognosis:  Patient's son encouraged that his father will improve.   Emotional Assessment Appearance:  Appears stated age Attitude/Demeanor/Rapport:  Unable to Assess Affect (typically observed):  Unable to Assess Orientation:   (on vent) Alcohol / Substance use:  Not Applicable Psych involvement (Current and /or in the community):  No (Comment)  Discharge Needs  Concerns to be addressed:  Care Coordination Readmission within the last 30 days:  No Current discharge risk:  None Barriers to Discharge:  No Barriers Identified   Shela Leff, LCSW 12/31/2015, 4:54 PM

## 2015-12-31 NOTE — Progress Notes (Signed)
OG placement was attempted with assistance of charge nurse. Pt began coughing again and bright red blood appeared in trach again.  Attempt was ended.  Elink Dr. Ashok Cordia notified. Dr. Rayann Heman paged. Will continue to assess.

## 2015-12-31 NOTE — Progress Notes (Signed)
Dr. Rayann Heman returned page. Orders given to place new dobhoff. Orders given to hold lovenox.  Notified of prior placement issues.  Pt will have gastric tube placed tomorrow.  Will continue to assess.

## 2015-12-31 NOTE — Progress Notes (Signed)
Dr.Kasa informed of pt's rising BP.  Dr.Kasa acknowledged. No new orders at this time.  Will continue to assess.

## 2015-12-31 NOTE — Progress Notes (Signed)
  SUBJECTIVE: Pt is intubated, eyes open but no response to voice or touch. He remains in sinus rhythm on amiodarone drip. He now has a tracheostomy.   Filed Vitals:   12/31/15 1000 12/31/15 1100 12/31/15 1130 12/31/15 1200  BP: 126/72 148/84  149/88  Pulse:      Temp: 98.8 F (37.1 C) 98.8 F (37.1 C)  99.1 F (37.3 C)  TempSrc:      Resp: '19 24  20  '$ Height:      Weight:      SpO2: 98% 91% 98% 96%    Intake/Output Summary (Last 24 hours) at 12/31/15 1434 Last data filed at 12/31/15 1200  Gross per 24 hour  Intake 1366.8 ml  Output   1975 ml  Net -608.2 ml    LABS: Basic Metabolic Panel:  Recent Labs  12/30/15 0452 12/31/15 0458  NA 146* 144  K 4.5 4.7  CL 114* 112*  CO2 28 26  GLUCOSE 190* 156*  BUN 36* 36*  CREATININE 1.08 0.99  CALCIUM 8.2* 8.0*  MG 1.9 2.1  PHOS 3.7 3.8   Liver Function Tests: No results for input(s): AST, ALT, ALKPHOS, BILITOT, PROT, ALBUMIN in the last 72 hours. No results for input(s): LIPASE, AMYLASE in the last 72 hours. CBC:  Recent Labs  12/30/15 0452 12/31/15 0458  WBC 14.7* 13.7*  HGB 9.0* 8.9*  HCT 29.0* 28.9*  MCV 91.0 92.8  PLT 326 250   Cardiac Enzymes: No results for input(s): CKTOTAL, CKMB, CKMBINDEX, TROPONINI in the last 72 hours. BNP: Invalid input(s): POCBNP D-Dimer: No results for input(s): DDIMER in the last 72 hours. Hemoglobin A1C: No results for input(s): HGBA1C in the last 72 hours. Fasting Lipid Panel: No results for input(s): CHOL, HDL, LDLCALC, TRIG, CHOLHDL, LDLDIRECT in the last 72 hours. Thyroid Function Tests: No results for input(s): TSH, T4TOTAL, T3FREE, THYROIDAB in the last 72 hours.  Invalid input(s): FREET3 Anemia Panel: No results for input(s): VITAMINB12, FOLATE, FERRITIN, TIBC, IRON, RETICCTPCT in the last 72 hours.   PHYSICAL EXAM General: Ventilated, lying in bed HEENT:  Normocephalic and atramatic Neck:  No JVD.  Lungs: Clear bilaterally to auscultation and  percussion. Heart: HRRR . Normal S1 and S2 without gallops or murmurs.   Neuro: No response to questions Psych:  withdrawn  TELEMETRY: sinus rhythm 81 bpm  ASSESSMENT AND PLAN: Respiratory failure with status post cardiac arrest. Left ventricular ejection fraction is 41% with septal hypokinesis suggestive of coronary artery disease. Patient has above-the-knee amputations bilaterally and is not a candidate for cardiac revascularization. Continue amiodarone drip as pt no longer has a feeding tube and cannot recieve oral dosing.  Active Problems:   Ischemic leg   Acute respiratory failure (HCC)   Ischemia of lower extremity   Altered mental status   Hematoma of neck   Occlusion of arterial bypass graft (HCC)   NSTEMI (non-ST elevated myocardial infarction) (HCC)   Severe sepsis (HCC)   Urinary retention   Aspiration into respiratory tract   Encounter for intubation   Pressure ulcer   Abdominal distension   Free intraperitoneal air   Ileus ([place])   Stroke ([place])   Cardiac arrest ([place])   PE (pulmonary embolism)    Daune Perch, NP 12/31/2015 2:34 PM

## 2015-12-31 NOTE — Progress Notes (Signed)
Dr.Kasa notified of 6th CBG below 180, and insulin rate below 4.  Orders given to continue insulin drip, but to titrate it down. Will continue to assess.

## 2015-12-31 NOTE — Progress Notes (Signed)
Spoke with Dr.Nestor regarding OG placement. New orders given to try OG "one time and one time only".

## 2015-12-31 NOTE — Progress Notes (Signed)
Pharmacy Anti-infective Note  Stephen Camacho is a 70 y.o. male admitted on 12/05/2015 with pneumonia.  Pharmacy has been consulted for vancomycin, Zosyn, and anidulafungin dosing.   Plan: Vancomycin trough is above goal so will decrease vancomycin dosing to 750 mg iv q 18 hours and check another trough with the 4th dose.     Zosyn: Continue Zosyn 3.375 g EI q 8 hours.   Anidulafungin '100mg'$  IV Q24hr.    Pharmacy will continue to monitor and adjust per consult.   Height: '6\' 4"'$  (193 cm) Weight: 138 lb 10.7 oz (62.9 kg) IBW/kg (Calculated) : 86.8  Temp (24hrs), Avg:98.4 F (36.9 C), Min:97.9 F (36.6 C), Max:99.1 F (37.3 C)   Recent Labs Lab 12/28/15 0012 12/28/15 0300 12/28/15 0519 12/28/15 1130 12/28/15 1732 12/29/15 0455 12/30/15 0452 12/31/15 0458 12/31/15 1157  WBC 16.7*  --  17.8*  --  16.6* 15.7* 14.7* 13.7*  --   CREATININE 1.26*  --  1.22  --   --  1.24 1.08 0.99  --   LATICACIDVEN 6.0* 2.2*  --   --   --   --   --   --   --   VANCOTROUGH  --   --   --  12  --   --   --   --  22*    Estimated Creatinine Clearance: 61.8 mL/min (by C-G formula based on Cr of 0.99).    No Known Allergies  Antimicrobials this admission: vancomycin 02/22 >> 02/23 ceftazidime 02/22 >> 02/24 Metronidazole 02/23 >> 02/24 Zosyn 02/24 >> Vancomycin 02/26 >> Anidulafungin 02/27>>  Dose adjustments this admission: Vancomycin changed from 750 mg iv q 18 hours to 1000 mg iv q 18 hours with low trough Vancomycin changed back to 750 mg iv q 18 hours with elevated trough  Microbiology results: 02/23 BCx: negative x 2 02/23 UCx: negative  02/21 Sputum: Candida albicans  02/22 MRSA PCR: negative 02/23 TA Candida albicans 02/26 TA moderate growth Candida albicans  Pharmacy will continue to monitor and adjust per consult.    Napoleon Form 12/31/2015 12:47 PM

## 2015-12-31 NOTE — Progress Notes (Addendum)
Palliative Care Update  Full note to follow.  I spoke with pts son, [name], today.  I had talked with pts daughter yesterday.  Daughter was inclined to want to rethink the pts full code status.    Son, [name], seemed at first to agree that if his father 'dies again' that we should leave him 'passed on'. But, as we talked (with the nurse present), he seemed to go back and forth.  Ultimately, he said to leave his father 'FULL CODE' because, he said, "I believe in God and God has the ultimate say'.    It seemed readily obvious that a discussion about dyiing naturally (the way God might intend it to be) was not going to be beneficial.  I therefore thanked the son for his time and we shook hands.    Pt to remain Full Code.  I will sign off at this time, but will stay aware of what is going on with pt so that I can become re-involved if necessary.  This could be needed if we suspect lung or prostate cancer to be recurrent.  His lung CA diagnosis ought to be known since he was being followed at [facility] every June since 2012 when he had his lobectomy. (  PSA is back and is low at 0.02 so there is not a recurrence of his prostate cancer that was treated surgically about 10 yrs ago).    Kirby Funk, MD

## 2015-12-31 NOTE — Clinical Social Work Note (Signed)
CSW has left message for patient's son: Stephen Camacho: [phone] to contact me regarding discussing role of CSW and the possibility of Vent SN[facility] if LTAC does not come through.  Shela Leff MSW,LCSW [phone]

## 2015-12-31 NOTE — Progress Notes (Signed)
Dr.Rein paged and notified of multiple failed NG placement attempts.  No new orders given at this time. Dr.Rein also spoke with son about Gastric tube being placed tomorrow.  Son declined to sign consent wanting his sister to do tomorrow.  Dr.Rein called back and asked to have OG placed in order to give contrast for tomorrow.  Paging Elink to ask for NG placement.

## 2015-12-31 NOTE — Progress Notes (Signed)
PARENTERAL NUTRITION CONSULT NOTE - FOLLOW UP   Pharmacy Consult for TPN Electrolyte and Glucose Monitoirng  Indication: Bowel Perforation  No Known Allergies  Patient Measurements: Height: '6\' 4"'$  (193 cm) Weight: 138 lb 10.7 oz (62.9 kg) IBW/kg (Calculated) : 86.8   Vital Signs: Temp: 98.8 F (37.1 C) (03/08 1100) Temp Source: Rectal (03/08 0800) BP: 148/84 mmHg (03/08 1100) Pulse Rate: 70 (03/08 0424) Intake/Output from previous day: 03/07 0701 - 03/08 0700 In: 1188.4 [I.V.:1105.4; TPN:83] Out: 2005 [Urine:2000; Blood:5] Intake/Output from this shift: Total I/O In: 126.8 [I.V.:126.8] Out: 700 [Urine:700]  Labs:  Recent Labs  12/28/15 1156  12/29/15 0455 12/30/15 0452 12/31/15 0458  WBC  --   < > 15.7* 14.7* 13.7*  HGB  --   < > 9.6* 9.0* 8.9*  HCT  --   < > 31.1* 29.0* 28.9*  PLT  --   < > 408 326 250  APTT 38*  --   --   --   --   INR 1.35  --   --   --   --   < > = values in this interval not displayed.   Recent Labs  12/29/15 0455 12/30/15 0452 12/31/15 0458  NA 147* 146* 144  K 4.3 4.5 4.7  CL 114* 114* 112*  CO2 '27 28 26  '$ GLUCOSE 317* 190* 156*  BUN 33* 36* 36*  CREATININE 1.24 1.08 0.99  CALCIUM 8.0* 8.2* 8.0*  MG 2.0 1.9 2.1  PHOS 3.3 3.7 3.8   Estimated Creatinine Clearance: 61.8 mL/min (by C-G formula based on Cr of 0.99).    Recent Labs  12/31/15 0811 12/31/15 0919 12/31/15 1011  GLUCAP 125* 104* 121*    Medical History: Past Medical History  Diagnosis Date  . Hyperlipidemia   . Hypertension   . Diabetes mellitus ([place])   . CAD (coronary artery disease)     a. s/p 2 v CABG in 2012 (LIMA-LAD & SVG-OM); b. cath 07/2012 s/p PCI/DES to ostial LCx and OM  . PAD (peripheral artery disease) ([place])     a. s/p right SFA stent; b. s/p right toe amputation 2011; c. s/p right AKA spring 2016  . Seizures ([place])   . Gangrene of foot ([place])   . Wheezing   . Lung cancer ([place])   . Prostate cancer ([place])   . Stroke [facility])     Medications:   Scheduled:  . anidulafungin  100 mg Intravenous Q24H  . antiseptic oral rinse  7 mL Mouth Rinse 10 times per day  . chlorhexidine gluconate  15 mL Mouth Rinse BID  . collagenase   Topical Daily  . enoxaparin (LOVENOX) injection  40 mg Subcutaneous Q24H  . feeding supplement (VITAL HIGH PROTEIN)  1,000 mL Per Tube Q24H  . free water  30 mL Per Tube 6 times per day  . pantoprazole (PROTONIX) IV  40 mg Intravenous Q24H  . piperacillin-tazobactam (ZOSYN)  IV  3.375 g Intravenous 3 times per day  . vancomycin  1,000 mg Intravenous Q18H   Infusions:  . Marland KitchenTPN (CLINIMIX-E) Adult 83 mL/hr at 12/31/15 1100  . Marland KitchenTPN (CLINIMIX-E) Adult    . amiodarone 30 mg/hr (12/30/15 2012)  . fentaNYL 10 mcg/ml infusion 250 mcg/hr (12/30/15 2148)  . insulin (NOVOLIN-R) infusion 2 mL/hr at 12/31/15 1100  . norepinephrine (LEVOPHED) Adult infusion Stopped (12/26/15 2151)    Insulin Requirements in the past 24 hours: On continuous drip.    Current Nutrition:  Clinimix E 5/15 at  83 mL/min   Assessment: 70 y/o M with multiple medical problems including bowel perforation.     Plan:  1. Electrolytes: Electrolytes are WNL. Will f/u AM labs.   2. Insulin: Patient failed trial off insulin drip. Plan is to continue patient on insulin drip.   Pharmacy will continue to monitor and adjust per consult.   [name] 12/31/2015,11:16 AM

## 2015-12-31 NOTE — Progress Notes (Signed)
Stephen Camacho  Daily Progress Note   Subjective    He now follows commands he remains intubated he did have successful tracheostomy   Objective Filed Vitals:   12/31/15 1500 12/31/15 1600 12/31/15 1700 12/31/15 1800  BP: 164/97 171/95 142/85 152/88  Pulse:      Temp: 99.9 F (37.7 C) 99.7 F (37.6 C) 99.3 F (37.4 C) 99.3 F (37.4 C)  TempSrc:  Core (Comment)    Resp: '26 26 20 20  '$ Height:      Weight:      SpO2: 100% 100% 100% 100%    Intake/Output Summary (Last 24 hours) at 12/31/15 2028 Last data filed at 12/31/15 1800  Gross per 24 hour  Intake 2352.58 ml  Output   1950 ml  Net 402.58 ml    PULM  new tracheostomy present no bleeding, patient is ventilated , no use of accessory muscles CV  No JVD, RRR Abd      No distended, nontender VASC  left above-knee amputation stump clean dry and intact  Laboratory CBC    Component Value Date/Time   WBC 13.7* 12/31/2015 0458   WBC 4.9 10/25/2014 1331   HGB 8.9* 12/31/2015 0458   HGB 12.1* 10/25/2014 1331   HCT 28.9* 12/31/2015 0458   HCT 38.1* 10/25/2014 1331   PLT 250 12/31/2015 0458   PLT 151 10/25/2014 1331    BMET    Component Value Date/Time   NA 144 12/31/2015 0458   NA 138 10/25/2014 1331   K 4.7 12/31/2015 0458   K 3.9 10/25/2014 1331   CL 112* 12/31/2015 0458   CL 109* 10/25/2014 1331   CO2 26 12/31/2015 0458   CO2 21 10/25/2014 1331   GLUCOSE 156* 12/31/2015 0458   GLUCOSE 232* 10/25/2014 1331   BUN 36* 12/31/2015 0458   BUN 17 10/25/2014 1331   CREATININE 0.99 12/31/2015 0458   CREATININE 1.11 10/25/2014 1331   CALCIUM 8.0* 12/31/2015 0458   CALCIUM 8.7 10/25/2014 1331   GFRNONAA >60 12/31/2015 0458   GFRNONAA >60 10/25/2014 1331   GFRNONAA 52* 07/08/2014 1051   GFRAA >60 12/31/2015 0458   GFRAA >60 10/25/2014 1331   GFRAA >60 07/08/2014 1051    Assessment/Planning:    Family continues to wish everything be done patient is therefore had successful tracheostomy  today and later this week we will address the issue of enteral feeds and/or a G-tube which will be scheduled for this Friday. His overall continued condition is grave his multiple cardiac events and his elevated white count suggest that his prognosis is quite poor family has been informed of this.    Stephen Camacho, Stephen Camacho  12/31/2015, 8:28 PM

## 2016-01-01 ENCOUNTER — Inpatient Hospital Stay: Payer: PPO

## 2016-01-01 LAB — BASIC METABOLIC PANEL
Anion gap: 4 — ABNORMAL LOW (ref 5–15)
BUN: 34 mg/dL — ABNORMAL HIGH (ref 6–20)
CHLORIDE: 108 mmol/L (ref 101–111)
CO2: 27 mmol/L (ref 22–32)
Calcium: 8 mg/dL — ABNORMAL LOW (ref 8.9–10.3)
Creatinine, Ser: 0.96 mg/dL (ref 0.61–1.24)
GFR calc non Af Amer: >60 mL/min (ref >60)
Glucose, Bld: 175 mg/dL — ABNORMAL HIGH (ref 65–99)
POTASSIUM: 4.4 mmol/L (ref 3.5–5.1)
SODIUM: 139 mmol/L (ref 135–145)

## 2016-01-01 LAB — GLUCOSE, CAPILLARY
GLUCOSE-CAPILLARY: 210 mg/dL — AB (ref 65–99)
GLUCOSE-CAPILLARY: 193 mg/dL — AB (ref 65–99)
GLUCOSE-CAPILLARY: 152 mg/dL — AB (ref 65–99)
GLUCOSE-CAPILLARY: 91 mg/dL (ref 65–99)
GLUCOSE-CAPILLARY: 93 mg/dL (ref 65–99)
GLUCOSE-CAPILLARY: 129 mg/dL — AB (ref 65–99)
GLUCOSE-CAPILLARY: 187 mg/dL — AB (ref 65–99)
GLUCOSE-CAPILLARY: 167 mg/dL — AB (ref 65–99)
GLUCOSE-CAPILLARY: 178 mg/dL — AB (ref 65–99)
GLUCOSE-CAPILLARY: 181 mg/dL — AB (ref 65–99)
Glucose-Capillary: 109 mg/dL — ABNORMAL HIGH (ref 65–99)
Glucose-Capillary: 121 mg/dL — ABNORMAL HIGH (ref 65–99)
Glucose-Capillary: 141 mg/dL — ABNORMAL HIGH (ref 65–99)
Glucose-Capillary: 147 mg/dL — ABNORMAL HIGH (ref 65–99)
Glucose-Capillary: 151 mg/dL — ABNORMAL HIGH (ref 65–99)
Glucose-Capillary: 154 mg/dL — ABNORMAL HIGH (ref 65–99)
Glucose-Capillary: 155 mg/dL — ABNORMAL HIGH (ref 65–99)
Glucose-Capillary: 174 mg/dL — ABNORMAL HIGH (ref 65–99)
Glucose-Capillary: 175 mg/dL — ABNORMAL HIGH (ref 65–99)
Glucose-Capillary: 193 mg/dL — ABNORMAL HIGH (ref 65–99)

## 2016-01-01 LAB — MAGNESIUM: MAGNESIUM: 1.9 mg/dL (ref 1.7–2.4)

## 2016-01-01 LAB — PHOSPHORUS: PHOSPHORUS: 3.7 mg/dL (ref 2.5–4.6)

## 2016-01-01 MED ORDER — LIDOCAINE HCL (PF) 1 % IJ SOLN
INTRAMUSCULAR | Status: AC
  Filled 2016-01-01: qty 30

## 2016-01-01 MED ORDER — MAGNESIUM SULFATE 2 GM/50ML IV SOLN
2.0000 g | Freq: Once | INTRAVENOUS | Status: AC
  Administered 2016-01-01: 2 g via INTRAVENOUS
  Filled 2016-01-01: qty 50

## 2016-01-01 MED ORDER — TRACE MINERALS CR-CU-MN-SE-ZN 10-1000-500-60 MCG/ML IV SOLN
INTRAVENOUS | Status: AC
  Filled 2016-01-01: qty 1992

## 2016-01-01 MED ORDER — IOHEXOL 300 MG/ML  SOLN
30.0000 mL | Freq: Once | INTRAMUSCULAR | Status: AC | PRN
  Administered 2016-01-01: 15 mL

## 2016-01-01 NOTE — Progress Notes (Signed)
Palliative Medicine Inpatient Consult Follow Up Note   Name: Stephen Camacho Date: 01/01/2016 MRN: [id number]  DOB: 10-04-1946  Referring Physician: [name], MD  Palliative Care consult requested for this 70 y.o. male for goals of medical therapy in patient with multiple conditions as detailed under impression below and also in history detailed in my note from yesterday.    TODAY'S DISCUSSIONS AND EVENTS: 1.  I met with pt's son today.  Nurse was present.  I spoke about code status and tried to explain it so that I was sure he understood this was not about care of the living but only what we do when someone has stopped living and does not have a heart beat or breathing going on.  It seemed that once I clarified this that he might want to agree to a DNR.   BUT, it was not to be.  As we talked, he kept saying he wants [place] to 'save him'.  Even clarifying that in a code situation, the person is already 'gone' and the issue is trying to bring them back (when their brain might not come back--especially with repeated code situations).   Ultimately, he said to leave his father 'FULL CODE' because, he said, "I believe in God and God has the ultimate say'.   It seemed readily obvious that a discussion about dyiing naturally (the way God might intend it to be) was not going to be beneficial. I therefore thanked the son for his time and we shook hands.   I will sign off at this time, but will stay aware of what is going on with pt so that I can become re-involved if necessary. This could be needed if we suspect lung or prostate cancer to be recurrent.  ( PSA is back and is low at 0.02 so there is not a recurrence of his prostate cancer that was treated surgically about 10 yrs ago).    His lung CA diagnosis ought to be known since he was being followed at [facility] every June since 2012 when he had his lobectomy.         IMPRESSION Cardiac Arrest on 3/5 (see details above) Acute  Resp Failure ---intubated on day of admission 12/06/15 ---he had probably aspirated as there were extensive secretions suctioned upon intubation ---extubated on 2/18 (he began to be more awake and following some commands) ---out of ICU on 2/19 and on BIPAP  ---re-intubated 2/21 due to worsening resp status due to bilat pneumonia ---now also with trache placed 3/7 COPD/ Emphysema as seen in records from CT done at [facility] this past year Former Smoker ---quit date not known but daughter said he smoked 1-2 packs daily for almost his entire adult life Septic shock -- due to left leg thrombus necrosis Past H/O lung cancer ---s/p right lung lobectomy Severe PAD ---with prior Right AKA (2015--following thrombosis of arterial bypass graft ) and prior left Femoral Popliteal Bypass  Occlusive Thrombus of Left Femoral Popliteal Bypass  ---s/p angioplasty of left peroneal artery with distal bypass anastomosis & popliteal artery ---with post op resp distress on 12/06/15 CAD  ---with drug eluting stent placements in 2013 ---s/p CABG X2 ---with an acute NSTEMI here on 2/11-12.  HTN ---with malignant HTN here after shock from sepsis was resolved Dyslipidemia Acute renal failure due to ATN ---partly due to procedure related dye use and volume depletion with baseline Cr 0.83 ---did not require dialysis as he started putting out more urine by 2/15.  Anemia requiring transfusions Pneumonia possibly due to aspiration DM2 Seizure Disorder  Gangrene of right foot Urinary Retention ---had urology consult by Dr. Erlene Quan who rec to keep foley in place while pt in ICU and to start Flomax when able to take po capsules (also to follow up Prostate Cancer hx as outpt).  Prostate Cancer ---daughter says he had his prostate surgically removed and she is unsure if he had follow up (about 10 yrs ago ? Here) CVA Severe Metabolic Acidosis Anemia --transfused Acute Diastolic CHF  --echo on 8/00  showed EF 55-60% and grade 1 diastolic dysfunction Acute Ischemic CHF --echo on 3/5 showed EF decreased following NSTEMI down to 35-40% --moderate Mitral Regurg and Tricuspid Regurg was noted.  --four chamber dilation was noted with septal hypokinesis also Severe Pulmonary HTN with pA peak pressure was 67 mm HG Thrombocytopenia Severe Hypernatremia Hypokalemia Persistent Fevers ---antifungal Rx started due to persistent fevers and also due to candida in sputum Metabolic Encephalopathy Perforated Bowel  ---likely due to needle perforation which occurred during a difficult and aborted PEG placement attempt on 3/1.  Possible Anoxic Brain Injury Very severe malnutrition and hypoalbuminemia (Alb =1.6 )   CODE STATUS: Full code   PAST MEDICAL HISTORY: Past Medical History  Diagnosis Date  . Hyperlipidemia   . Hypertension   . Diabetes mellitus ([place])   . CAD (coronary artery disease)     a. s/p 2 v CABG in 2012 (LIMA-LAD & SVG-OM); b. cath 07/2012 s/p PCI/DES to ostial LCx and OM  . PAD (peripheral artery disease) ([place])     a. s/p right SFA stent; b. s/p right toe amputation 2011; c. s/p right AKA spring 2016  . Seizures ([place])   . Gangrene of foot ([place])   . Wheezing   . Lung cancer ([place])   . Prostate cancer ([place])   . Stroke [facility])     PAST SURGICAL HISTORY:  Past Surgical History  Procedure Laterality Date  . Right aka  07-10-2014  . Left femoral popliteal bypass    . Right lung lobeectomy    . Coronary artery bypass graft    . Peripheral vascular catheterization Left 12/06/2015    Procedure: Lower Extremity Angiography;  Surgeon: [name], MD;  Location: [place] [facility];  Service: Cardiovascular;  Laterality: Left;  . Peripheral vascular catheterization Left 12/06/2015    Procedure: Lower Extremity Intervention;  Surgeon: [name], MD;  Location: [place] [facility];  Service: Cardiovascular;  Laterality: Left;  . Peripheral vascular catheterization N/A  12/05/2015    Procedure: Lower Extremity Angiography;  Surgeon: [name], MD;  Location: [place] [facility];  Service: Cardiovascular;  Laterality: N/A;  . Peripheral vascular catheterization  12/05/2015    Procedure: Lower Extremity Intervention;  Surgeon: [name], MD;  Location: [place] [facility];  Service: Cardiovascular;;  . Amputation Left 12/12/2015    Procedure: AMPUTATION ABOVE KNEE;  Surgeon: [name], MD;  Location: [facility] ORS;  Service: Vascular;  Laterality: Left;  . Peg placement N/A 12/24/2015    Procedure: PERCUTANEOUS ENDOSCOPIC GASTROSTOMY (PEG) PLACEMENT;  Surgeon: Josefine Class, MD;  Location: [facility] ENDOSCOPY;  Service: Endoscopy;  Laterality: N/A;  . Tracheostomy tube placement N/A 12/30/2015    Procedure: TRACHEOSTOMY;  Surgeon: Beverly Gust, MD;  Location: [facility] ORS;  Service: ENT;  Laterality: N/A;    Vital Signs: BP 166/90 mmHg  Pulse 86  Temp(Src) 99.3 F (37.4 C) (Core (Comment))  Resp 18  Ht 6' 4"  (1.93 m)  Wt 64.8 kg (142 lb 13.7 oz)  BMI 17.40 kg/m2  SpO2 100% Filed Weights   12/30/15 0500 12/31/15 0508 01/01/16 0523  Weight: 62.6 kg (138 lb 0.1 oz) 62.9 kg (138 lb 10.7 oz) 64.8 kg (142 lb 13.7 oz)    Estimated body mass index is 17.4 kg/(m^2) as calculated from the following:   Height as of this encounter: 6' 4"  (1.93 m).   Weight as of this encounter: 64.8 kg (142 lb 13.7 oz).  PHYSICAL EXAM: Lying in bed, intubated via trache He blinks his eyes and it isn't known if this is purposeful or not No JVD or TM Hrt rrr  Lungs with vent sounds  Abd soft and NT Ext no mottling or cyanosis    More than 50% of the visit was spent in counseling/coordination of care: YES  Time Spent:

## 2016-01-01 NOTE — Care Management (Signed)
For PEG by interventional radiology today.  Updated [facility] and will actively pursue auth for ltac.

## 2016-01-01 NOTE — Progress Notes (Signed)
PULMONARY / CRITICAL CARE MEDICINE   Name: Stephen Camacho MRN: [id number] DOB: 1945-11-20    ADMISSION DATE:  12/05/2015   CONSULTATION DATE:  12/08/15  History of Present Illness:   70 YO male with 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 Now with acute respiratory failure, intubated due to pneumonia. Underwent a left AKA   SUBJECTIVE:  S/p trach, remains on Vent, abd is distended Zosyn/Vanc/antfungal therapy for complication from PEG tube placement s/p bowel/stomach perforation and candida in sputum Prognosis is very poor No issues overnight-plan for G tube placement by IR today, the Son was updated yesterday  VITAL SIGNS: BP 164/95 mmHg  Pulse 70  Temp(Src) 99.3 F (37.4 C) (Core (Comment))  Resp 32  Ht '6\' 4"'$  (1.93 m)  Wt 142 lb 13.7 oz (64.8 kg)  BMI 17.40 kg/m2  SpO2 91%  HEMODYNAMICS:    VENTILATOR SETTINGS: Vent Mode:  [-] PRVC FiO2 (%):  [30 %] 30 % Set Rate:  [15 bmp] 15 bmp Vt Set:  [500 mL] 500 mL PEEP:  [5 cmH20] 5 cmH20  INTAKE / OUTPUT: I/O last 3 completed shifts: In: 0626 [I.V.:1469.3; IV Piggyback:1800] Out: 3100 [Urine:3100]  PHYSICAL EXAMINATION: General: Chronically ill appearing Neuro:GCS<8T HEENT: NCAT, ETT in place Cardiovascular: RRR, S1/S2, no MRG Lungs: Bilateral airflow, no wheezes Abdomen: no BS, distended abd Ext: Bilateral AKA, Left stump clean and dry  LABS:  BMET  Recent Labs Lab 12/30/15 0452 12/31/15 0458 01/01/16 0538  NA 146* 144 139  K 4.5 4.7 4.4  CL 114* 112* 108  CO2 '28 26 27  '$ BUN 36* 36* 34*  CREATININE 1.08 0.99 0.96  GLUCOSE 190* 156* 175*    Electrolytes  Recent Labs Lab 12/30/15 0452 12/31/15 0458 01/01/16 0538  CALCIUM 8.2* 8.0* 8.0*  MG 1.9 2.1 1.9  PHOS 3.7 3.8 3.7    CBC  Recent  Labs Lab 12/29/15 0455 12/30/15 0452 12/31/15 0458  WBC 15.7* 14.7* 13.7*  HGB 9.6* 9.0* 8.9*  HCT 31.1* 29.0* 28.9*  PLT 408 326 250    Coag's  Recent Labs Lab 12/28/15 1156  APTT 38*  INR 1.35    Sepsis Markers  Recent Labs Lab 12/28/15 0012 12/28/15 0300  LATICACIDVEN 6.0* 2.2*    ABG  Recent Labs Lab 12/25/15 1000 12/28/15 0416  PHART 7.52* 7.41  PCO2ART 32 47  PO2ART 124* 175*    Liver Enzymes  Recent Labs Lab 12/26/15 1109 12/27/15 0419 12/28/15 0012  AST 20 20 45*  ALT '31 26 29  '$ ALKPHOS 101 111 96  BILITOT 1.1 0.8 0.7  ALBUMIN 1.8* 1.9* 1.6*    Cardiac Enzymes  Recent Labs Lab 12/28/15 0012  TROPONINI 0.27*    Glucose  Recent Labs Lab 01/01/16 0229 01/01/16 0412 01/01/16 0509 01/01/16 0645 01/01/16 0750 01/01/16 0855  GLUCAP 151* 141* 121* 147* 175* 210*   Dg Chest 1 View  12/31/2015  CLINICAL DATA:  Encounter for nasogastric tube placement. EXAM: CHEST 1 VIEW COMPARISON:  12/28/2015 FINDINGS: The enteric tube passes into the left mainstem bronchus projecting into the left lower lobe. Hazy bilateral airspace lung opacities are noted, increased in the right lower lung when compared the prior exam, likely due to airspace edema. Changes from cardiac surgery are stable. Cardiac silhouette is mildly enlarged. Tracheostomy tube, also new from the prior study, is well positioned. IMPRESSION:  1. Enteric feeding tube tip projects in the central left lower lobe bronchus. Critical Value/emergent results were called by telephone at the time of interpretation on 12/31/2015 at 1:46 pm to ICU nurse Everett Graff, who verbally acknowledged these results. 2. New tracheostomy tube is well positioned. 3. Mild increase in airspace opacity in the left lower lung. Persistent left mid to lower lung zone airspace opacity. Findings likely due to asymmetric pulmonary edema with small pleural effusions. Electronically Signed   By: Lajean Manes M.D.   On:  12/31/2015 13:47   Dg Abd 1 View  12/31/2015  CLINICAL DATA:  Encounter for nasogastric tube placement. EXAM: ABDOMEN - 1 VIEW COMPARISON:  December 30, 2015. FINDINGS: Distal tip of feeding tube appears to be in the left mainstem bronchus. No abnormal bowel dilatation is noted. IMPRESSION: Distal tip of feeding tube appears to be in the left mainstem bronchus. Critical Value/emergent results were called by telephone at the time of interpretation on 12/31/2015 at 1:42 pm to Kathyrn Drown, the patient's nurse, who verbally acknowledged these results and will immediately contact the ordering physician. Electronically Signed   By: Marijo Conception, M.D.   On: 12/31/2015 13:43   EVENTS/DATA: 02/10 >> LLE angiogram, thrombolysis, thrombectomy, angioplasty 02/11 Intubated for suspected PNA 02/11 elevated trop I. Pk trop I 62.6 on 02/20 02/12 CT head: New nonhemorrhagic infarcts involving the right PCA territory and bilateral cerebellum, left greater than right 02/17 L AKA 02/18>extubated 02/21 TTE: LVEF 55-60% 02/21 RUE [place]: no DVT 02/21>Re-intubated 02/22 >CT head: Interval evolution of bilateral cerebellar and right PCA territory infarcts 02/27-remains intubated,discussion with family-proceed with trach and PEG 12/24/2015: Attempted EGD and PEG placement resulting in complication of bowel perforation 03/04-Asystole arrest; CPR x 12 minutes with ROSC 03/07 -trach placed  INDWELLING DEVICES:: ETT 02/11 >> 02/18, 02/21 >>  R IJ CVL 02/10 >> removed PICC line RT  MICRO DATA: MRSA PCR 02/13 >> NEG Urine 02/12 >> NEG Resp 02/11 >> MSSA C diff 02/16 >> NEG Blood 02/12 >> NEG Urine 02/19 >> NEG C diff 02/21 >> NEG Cath tip 02/19 >> NEG Resp 02/18 >> light growth candida Urine 02/21 >> NEG MRSA PCR 02/22 >> NEG Respiratory 02/26>>  ANTIMICROBIALS:  Vanc 02/11 >> 02/23 Pip-tazo 02/11 >> 02/20 Ceftaz 02/21 >> 02/24 Metronidazole 02/21 >> 02/24 Pip-tazo 02/24 >>  Vancomycin 02/25>>  ASSESSMENT  / PLAN: 70 yo male with 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 Now with acute respiratory failure, intubated due to pneumonia. Underwent a left AKA 02/17, failed extubation and prolonged ventilator dependence, bowel perforation s/p peg tube placement attempt, and cardiac arrest.  Given his multiple co-morbidities and frail status, his overall prognosis is very poor.  PULMONARY A: Prolonged VDRF Failed extubation attempt-s/p trach Bilateral pulmonary infiltrates and fever- c/w  PNA-resolving P:   -Cont vent support -settings reviewed -Cont vent bundle -plan for PS mode trial once G tube placed -Antibiotics as above  -CXR as needed -s/p Trach -remains on vent  CARDIOVASCULAR A:  Cardiac arrest Recurrent NSTEMI PAF, NSR presently NSVT Acute CHF with proBNP>45000; Echo at bedside revealed ejection fraction 41% with moderate to severe hypokinesis of the septum and inferior wall moderate mitral regurgitation and severe pulmonary hypertension with PA systolic pressure 67 mmHg S/p cardiac arrest-prognosis is poor P:  -Heparin gtt stopped -MAP goal > 65  mmHg -Cont ASA -Continue amiodarone  -Hemodynamics per ICU protocol  RENAL A:   AKI, nonoliguric-improved  Very poor candidate for HD P:   -Monitor BMET intermittently -Monitor I/Os -Correct electrolytes as indicated  GASTROINTESTINAL A:   No issues P:   - PPI - bowel/stomach perforation -Continue TPN  HEMATOLOGIC A:   ICU acquired anemia without acute blood loss P:  -DVT px: SQ heparin -Monitor CBC intermittently -Transfuse per usual guidelines   INFECTIOUS A:   Severe sepsis-persistent leukocytosis; no fever in 24hrs LLE gangrene Aspiration PNA P:   -Vanco and zosyn, antifungal therapy - Repeat cultures  if febrile -Tylenol and motrin prn for fever >103 - Follow up ID recs  ENDOCRINE A:   Type 2 DM-Persistent hyperglycemia P:   -on insulin infusion -Continue blood gluocose monitoring per ICU protocol  NEUROLOGIC A:   Multifocal CVAs, acute Acute encewphalopathy H/O seizures - not on chronic AEDs PTA P:   RASS goal: -1, -2 -Fentanyl gtt and prn versed for vent sedation  I have personally obtained a history, examined the patient, evaluated Pertinent laboratory and RadioGraphic/imaging results, and  formulated the assessment and plan   The Patient requires high complexity decision making for assessment and support, frequent evaluation and titration of therapies, application of advanced monitoring technologies and extensive interpretation of multiple databases. Critical Care Time devoted to patient care services described in this note is 45 minutes.   Overall, patient is critically ill, prognosis is guarded.  Patient with Multiorgan failure and at high risk for cardiac arrest and death.  Very poor chance of meaningful recovery  Corrin Parker, M.D.  [facility]  Medical Director [place] Director [place] Department

## 2016-01-01 NOTE — Progress Notes (Signed)
Notified Dr. Mortimer Fries that patient BP remains in the 814'G systolic- he stated he would not address it at this time since patient will be going for procedure later this afternoon.

## 2016-01-01 NOTE — Progress Notes (Signed)
Notified daughter [name] and updated her that the patient is finished with procedure and patient doing well and vitals are stable.

## 2016-01-01 NOTE — Progress Notes (Signed)
Pharmacy Anti-infective Note  Peggy Monk is a 70 y.o. male admitted on 12/05/2015 with pneumonia.  Pharmacy has been consulted for  Zosyn, and anidulafungin dosing.   Plan: Zosyn: Continue Zosyn 3.375 g EI q 8 hours.   Height: '6\' 4"'$  (193 cm) Weight: 142 lb 13.7 oz (64.8 kg) IBW/kg (Calculated) : 86.8  Temp (24hrs), Avg:98.9 F (37.2 C), Min:98.4 F (36.9 C), Max:99.9 F (37.7 C)   Recent Labs Lab 12/28/15 0012 12/28/15 0300 12/28/15 0519 12/28/15 1130 12/28/15 1732 12/29/15 0455 12/30/15 0452 12/31/15 0458 12/31/15 1157 01/01/16 0538  WBC 16.7*  --  17.8*  --  16.6* 15.7* 14.7* 13.7*  --   --   CREATININE 1.26*  --  1.22  --   --  1.24 1.08 0.99  --  0.96  LATICACIDVEN 6.0* 2.2*  --   --   --   --   --   --   --   --   VANCOTROUGH  --   --   --  12  --   --   --   --  22*  --     Estimated Creatinine Clearance: 65.6 mL/min (by C-G formula based on Cr of 0.96).    No Known Allergies  Antimicrobials this admission: vancomycin 02/22 >> 02/23, 2/26>>03/08  ceftazidime 02/22 >> 02/24 Metronidazole 02/23 >> 02/24 Anidulafungin 02/27>>03/08  Zosyn 02/24 >>  Microbiology results: 02/23 BCx: negative x 2 02/23 UCx: negative  02/21 Sputum: Candida albicans  02/22 MRSA PCR: negative 02/23 TA Candida albicans 02/26 TA moderate growth Candida albicans  Pharmacy will continue to monitor and adjust per consult.    Simpson,Michael L 01/01/2016 2:15 PM

## 2016-01-01 NOTE — Sedation Documentation (Signed)
Pt on ventilator,trached, on multiple drips from CCU, CCU nurse present

## 2016-01-01 NOTE — Progress Notes (Signed)
Nutrition Follow-up    INTERVENTION:   EN: TF on hold until PEG tube placed PN: recommend continuing 5%AA/15%Dextrose at rate of 83 ml/hr (goal rate) until PEG placed and pt demonstrating tolerance of TF   NUTRITION DIAGNOSIS:   Inadequate oral intake related to inability to eat as evidenced by NPO status.  GOAL:   Patient will meet greater than or equal to 90% of their needs  MONITOR:    (Energy Intake, Electrolyte and renal Profile, Anthropometrics, Digestive System, Glucose Profile, Pulmonary Profile)  REASON FOR ASSESSMENT:   Consult New TPN/TNA  ASSESSMENT:   Pt remains on vent via trach, plan for PEG placement by IR today  Diet Order:  .TPN (CLINIMIX-E) Adult Diet NPO time specified .TPN (CLINIMIX-E) Adult   PN: tolerating 5%AA/15%Dextrose at rate of 83 ml/hr via PICC  EN: TF never started yesterday as no access  Skin:   (stage II pressure ulcer on sacrum and lip)  Digestive System: multiple attempts of dobhoff placement  unsuccessful yesterday (xray with tube in lung), OG removed and attempts to replace unsuccessful as well   Recent Labs Lab 12/30/15 0452 12/31/15 0458 01/01/16 0538  NA 146* 144 139  K 4.5 4.7 4.4  CL 114* 112* 108  CO2 '28 26 27  '$ BUN 36* 36* 34*  CREATININE 1.08 0.99 0.96  CALCIUM 8.2* 8.0* 8.0*  MG 1.9 2.1 1.9  PHOS 3.7 3.8 3.7  GLUCOSE 190* 156* 175*    Glucose Profile:  Recent Labs  01/01/16 0645 01/01/16 0750 01/01/16 0855  GLUCAP 147* 175* 210*   Meds: insulin drip   Height:   Ht Readings from Last 1 Encounters:  12/29/15 '6\' 4"'$  (1.93 m)    Weight:   Wt Readings from Last 1 Encounters:  01/01/16 142 lb 13.7 oz (64.8 kg)   Filed Weights   12/30/15 0500 12/31/15 0508 01/01/16 0523  Weight: 138 lb 0.1 oz (62.6 kg) 138 lb 10.7 oz (62.9 kg) 142 lb 13.7 oz (64.8 kg)    BMI:  Body mass index is 17.4 kg/(m^2).  Estimated Nutritional Needs:   Kcal:  1371 kcals (Ve: 8.7, Tmax: 38.4) using wt of 59 kg, length  of 44 inches  Protein:  89-118 g (1.5-2.0 g/kg)   Fluid:  1770-2065 mL (30-35 ml/kg)   EDUCATION NEEDS:   No education needs identified at this time  [place], RD, LDN [phone] Pager  [phone] Weekend/On-Call Pager

## 2016-01-01 NOTE — Progress Notes (Signed)
PARENTERAL NUTRITION CONSULT NOTE - FOLLOW UP   Pharmacy Consult for TPN Electrolyte and Glucose Monitoirng  Indication: Bowel Perforation  No Known Allergies  Patient Measurements: Height: '6\' 4"'$  (193 cm) Weight: 142 lb 13.7 oz (64.8 kg) IBW/kg (Calculated) : 86.8   Vital Signs: Temp: 99 F (37.2 C) (03/09 0900) BP: 167/97 mmHg (03/09 0900) Intake/Output from previous day: 03/08 0701 - 03/09 0700 In: 5192 [I.V.:1047.3; IV Piggyback:1800; TPN:2344.8] Out: 2200 [Urine:2200] Intake/Output from this shift: Total I/O In: 250 [IV Piggyback:250] Out: 825 [Urine:825]  Labs:  Recent Labs  12/30/15 0452 12/31/15 0458  WBC 14.7* 13.7*  HGB 9.0* 8.9*  HCT 29.0* 28.9*  PLT 326 250     Recent Labs  12/30/15 0452 12/31/15 0458 01/01/16 0538  NA 146* 144 139  K 4.5 4.7 4.4  CL 114* 112* 108  CO2 '28 26 27  '$ GLUCOSE 190* 156* 175*  BUN 36* 36* 34*  CREATININE 1.08 0.99 0.96  CALCIUM 8.2* 8.0* 8.0*  MG 1.9 2.1 1.9  PHOS 3.7 3.8 3.7   Estimated Creatinine Clearance: 65.6 mL/min (by C-G formula based on Cr of 0.96).    Recent Labs  01/01/16 1042 01/01/16 1153 01/01/16 1250  GLUCAP 193* 152* 109*    Medical History: Past Medical History  Diagnosis Date  . Hyperlipidemia   . Hypertension   . Diabetes mellitus ([place])   . CAD (coronary artery disease)     a. s/p 2 v CABG in 2012 (LIMA-LAD & SVG-OM); b. cath 07/2012 s/p PCI/DES to ostial LCx and OM  . PAD (peripheral artery disease) ([place])     a. s/p right SFA stent; b. s/p right toe amputation 2011; c. s/p right AKA spring 2016  . Seizures ([place])   . Gangrene of foot ([place])   . Wheezing   . Lung cancer ([place])   . Prostate cancer ([place])   . Stroke [facility])     Medications:  Scheduled:  . antiseptic oral rinse  7 mL Mouth Rinse 10 times per day  . chlorhexidine gluconate  15 mL Mouth Rinse BID  . collagenase   Topical Daily  . feeding supplement (VITAL HIGH PROTEIN)  1,000 mL Per Tube Q24H  . free water  30 mL  Per Tube 6 times per day  . pantoprazole (PROTONIX) IV  40 mg Intravenous Q24H  . piperacillin-tazobactam (ZOSYN)  IV  3.375 g Intravenous 3 times per day   Infusions:  . Marland KitchenTPN (CLINIMIX-E) Adult 83 mL/hr at 12/31/15 1851  . Marland KitchenTPN (CLINIMIX-E) Adult    . amiodarone 30 mg/hr (12/31/15 1551)  . fentaNYL 10 mcg/ml infusion 250 mcg/hr (01/01/16 0849)  . insulin (NOVOLIN-R) infusion 3.3 Units/hr (12/31/15 2111)  . norepinephrine (LEVOPHED) Adult infusion Stopped (12/26/15 2151)    Insulin Requirements in the past 24 hours: On continuous drip.    Current Nutrition:  Clinimix E 5/15 at 83 mL/min   Assessment: 70 y/o M with multiple medical problems including bowel perforation.   Plan:  1. Electrolytes: will supplement magnesium 2g IV x 1. No further supplementation required.    2. Insulin: Patient failed trial off insulin drip. Plan is to continue patient on insulin drip.   Pharmacy will continue to monitor and adjust per consult.   Lankford Gutzmer L 01/01/2016,2:14 PM

## 2016-01-01 NOTE — Progress Notes (Signed)
SUBJECTIVE: Remains intubated   Filed Vitals:   01/01/16 0523 01/01/16 0600 01/01/16 0700 01/01/16 0800  BP:  164/99 163/95 164/95  Pulse:      Temp:  98.6 F (37 C) 99 F (37.2 C) 99.3 F (37.4 C)  TempSrc:      Resp:  27 31 32  Height:      Weight: 142 lb 13.7 oz (64.8 kg)     SpO2:  91% 91% 91%    Intake/Output Summary (Last 24 hours) at 01/01/16 0927 Last data filed at 01/01/16 0524  Gross per 24 hour  Intake 4993.93 ml  Output   2200 ml  Net 2793.93 ml    LABS: Basic Metabolic Panel:  Recent Labs  12/31/15 0458 01/01/16 0538  NA 144 139  K 4.7 4.4  CL 112* 108  CO2 26 27  GLUCOSE 156* 175*  BUN 36* 34*  CREATININE 0.99 0.96  CALCIUM 8.0* 8.0*  MG 2.1 1.9  PHOS 3.8 3.7   Liver Function Tests: No results for input(s): AST, ALT, ALKPHOS, BILITOT, PROT, ALBUMIN in the last 72 hours. No results for input(s): LIPASE, AMYLASE in the last 72 hours. CBC:  Recent Labs  12/30/15 0452 12/31/15 0458  WBC 14.7* 13.7*  HGB 9.0* 8.9*  HCT 29.0* 28.9*  MCV 91.0 92.8  PLT 326 250   Cardiac Enzymes: No results for input(s): CKTOTAL, CKMB, CKMBINDEX, TROPONINI in the last 72 hours. BNP: Invalid input(s): POCBNP D-Dimer: No results for input(s): DDIMER in the last 72 hours. Hemoglobin A1C: No results for input(s): HGBA1C in the last 72 hours. Fasting Lipid Panel: No results for input(s): CHOL, HDL, LDLCALC, TRIG, CHOLHDL, LDLDIRECT in the last 72 hours. Thyroid Function Tests: No results for input(s): TSH, T4TOTAL, T3FREE, THYROIDAB in the last 72 hours.  Invalid input(s): FREET3 Anemia Panel: No results for input(s): VITAMINB12, FOLATE, FERRITIN, TIBC, IRON, RETICCTPCT in the last 72 hours.   PHYSICAL EXAM General: Well developed, well nourished, in no acute distress HEENT:  Normocephalic and atramatic Neck:  No JVD.  Lungs: Clear bilaterally to auscultation and percussion. Heart: HRRR . Normal S1 and S2 without gallops or murmurs.  Abdomen: Bowel  sounds are positive, abdomen soft and non-tender  Msk:  Back normal, normal gait. Normal strength and tone for age. Extremities: No clubbing, cyanosis or edema.   Neuro: Alert and oriented X 3. Psych:  Good affect, responds appropriately  TELEMETRY:Sinus rhythm  ASSESSMENT AND PLAN: Status post tracheostomy and respiratory failure with history of not non-STEMI and above-knee amputation. Conservative treatment but is not a DO NOT RESUSCITATE.  Active Problems:   Ischemic leg   Acute respiratory failure (HCC)   Ischemia of lower extremity   Altered mental status   Hematoma of neck   Occlusion of arterial bypass graft (HCC)   NSTEMI (non-ST elevated myocardial infarction) (HCC)   Severe sepsis (HCC)   Urinary retention   Aspiration into respiratory tract   Encounter for intubation   Pressure ulcer   Abdominal distension   Free intraperitoneal air   Ileus ([place])   Stroke ([place])   Cardiac arrest ([place])   PE (pulmonary embolism)    Dionisio David, MD, [facility] 01/01/2016 9:27 AM

## 2016-01-01 NOTE — Procedures (Signed)
Interventional Radiology Procedure Note  Procedure:  Percutaneous gastrostomy  Complications:  None  Estimated Blood Loss: < 10 mL  Stomach insufflated with air after advancing 5 Fr orogastric tube.  29 Fr bumper retention gastrostomy tube able to be placed with tip in body of stomach.  OK to use for meds now.  OK for tube feeds tomorrow.  Stephen Camacho. Kathlene Cote, M.D Pager:  [phone]

## 2016-01-01 NOTE — Progress Notes (Signed)
[facility]  Daily Progress Note   Subjective    He now follows commands he remains intubated he did have successful tracheostomy.  Today he had a successful G-tube placement  Objective Filed Vitals:   01/01/16 1615 01/01/16 1630 01/01/16 1645 01/01/16 1700  BP: 163/95 169/93 157/100 166/90  Pulse:      Temp:    99.3 F (37.4 C)  TempSrc:      Resp: '31 20 24 18  '$ Height:      Weight:      SpO2: 98% 100% 97% 100%    Intake/Output Summary (Last 24 hours) at 01/01/16 1833 Last data filed at 01/01/16 1815  Gross per 24 hour  Intake 5795.1 ml  Output   2425 ml  Net 3370.1 ml    PULM  new tracheostomy present no bleeding, patient is ventilated , no use of accessory muscles CV  No JVD, RRR Abd      mild distended, nontender, G-tube in place VASC  left above-knee amputation stump clean dry and intact  Laboratory CBC    Component Value Date/Time   WBC 13.7* 12/31/2015 0458   WBC 4.9 10/25/2014 1331   HGB 8.9* 12/31/2015 0458   HGB 12.1* 10/25/2014 1331   HCT 28.9* 12/31/2015 0458   HCT 38.1* 10/25/2014 1331   PLT 250 12/31/2015 0458   PLT 151 10/25/2014 1331    BMET    Component Value Date/Time   NA 139 01/01/2016 0538   NA 138 10/25/2014 1331   K 4.4 01/01/2016 0538   K 3.9 10/25/2014 1331   CL 108 01/01/2016 0538   CL 109* 10/25/2014 1331   CO2 27 01/01/2016 0538   CO2 21 10/25/2014 1331   GLUCOSE 175* 01/01/2016 0538   GLUCOSE 232* 10/25/2014 1331   BUN 34* 01/01/2016 0538   BUN 17 10/25/2014 1331   CREATININE 0.96 01/01/2016 0538   CREATININE 1.11 10/25/2014 1331   CALCIUM 8.0* 01/01/2016 0538   CALCIUM 8.7 10/25/2014 1331   GFRNONAA >60 01/01/2016 0538   GFRNONAA >60 10/25/2014 1331   GFRNONAA 52* 07/08/2014 1051   GFRAA >60 01/01/2016 0538   GFRAA >60 10/25/2014 1331   GFRAA >60 07/08/2014 1051    Assessment/Planning:    Family continues to wish everything be done patient is therefore had successful tracheostomy today  and later this week we will address the issue of enteral feeds and/or a G-tube was done today. Plan to move forward with LTAC.  His overall continued condition is grave his multiple cardiac events and his elevated white count suggest that his prognosis is quite poor family has been informed of this.    [name]  01/01/2016, 6:33 PM

## 2016-01-01 NOTE — Progress Notes (Addendum)
Inpatient Diabetes Program Recommendations  AACE/ADA: New Consensus Statement on Inpatient Glycemic Control (2015)  Target Ranges:  Prepandial:   less than 140 mg/dL      Peak postprandial:   less than 180 mg/dL (1-2 hours)      Critically ill patients:  140 - 180 mg/dL   Review of Glycemic Control  Results for HALLEY, KINCER (MRN [id number]) as of 01/01/2016 13:31  Ref. Range 01/01/2016 07:50 01/01/2016 08:55 01/01/2016 10:42 01/01/2016 11:53 01/01/2016 12:50  Glucose-Capillary Latest Ref Range: 65-99 mg/dL 175 (H) 210 (H) 193 (H) 152 (H) 109 (H)    Diabetes history: DM2 Outpatient Diabetes medications: Lantus 10 units QHS, Metformin 500 mg BID Current orders for Inpatient glycemic control: IV insulin via glucostabilizer currently at 3.9units /hour.  Currently TF on hold until PEG tube placed.  No further recommendations at this time. If patient is transitioned off the infusion, follow ICU Glycemic Control order guidelines for basal, bolus, and TPN insulin coverage doses. Give basal insulin (Lantus) 2 hours before the insulin drip is stopped.    Gentry Fitz, RN, BA, MHA, CDE Diabetes Coordinator Inpatient Diabetes Program  [phone] (Team Pager) [phone] ([place]) 01/01/2016 1:48 PM

## 2016-01-01 NOTE — OR Nursing (Signed)
Dr Kathlene Cote notified of Zosyn infusion hung at 1236, no additional antibiotic ordered.

## 2016-01-02 LAB — GLUCOSE, CAPILLARY
GLUCOSE-CAPILLARY: 135 mg/dL — AB (ref 65–99)
GLUCOSE-CAPILLARY: 143 mg/dL — AB (ref 65–99)
GLUCOSE-CAPILLARY: 100 mg/dL — AB (ref 65–99)
GLUCOSE-CAPILLARY: 137 mg/dL — AB (ref 65–99)
GLUCOSE-CAPILLARY: 162 mg/dL — AB (ref 65–99)
GLUCOSE-CAPILLARY: 202 mg/dL — AB (ref 65–99)
GLUCOSE-CAPILLARY: 138 mg/dL — AB (ref 65–99)
GLUCOSE-CAPILLARY: 131 mg/dL — AB (ref 65–99)
Glucose-Capillary: 108 mg/dL — ABNORMAL HIGH (ref 65–99)
Glucose-Capillary: 116 mg/dL — ABNORMAL HIGH (ref 65–99)
Glucose-Capillary: 121 mg/dL — ABNORMAL HIGH (ref 65–99)
Glucose-Capillary: 124 mg/dL — ABNORMAL HIGH (ref 65–99)
Glucose-Capillary: 140 mg/dL — ABNORMAL HIGH (ref 65–99)
Glucose-Capillary: 140 mg/dL — ABNORMAL HIGH (ref 65–99)
Glucose-Capillary: 145 mg/dL — ABNORMAL HIGH (ref 65–99)
Glucose-Capillary: 151 mg/dL — ABNORMAL HIGH (ref 65–99)
Glucose-Capillary: 161 mg/dL — ABNORMAL HIGH (ref 65–99)
Glucose-Capillary: 171 mg/dL — ABNORMAL HIGH (ref 65–99)
Glucose-Capillary: 184 mg/dL — ABNORMAL HIGH (ref 65–99)
Glucose-Capillary: 202 mg/dL — ABNORMAL HIGH (ref 65–99)
Glucose-Capillary: 205 mg/dL — ABNORMAL HIGH (ref 65–99)
Glucose-Capillary: 209 mg/dL — ABNORMAL HIGH (ref 65–99)
Glucose-Capillary: 211 mg/dL — ABNORMAL HIGH (ref 65–99)
Glucose-Capillary: 227 mg/dL — ABNORMAL HIGH (ref 65–99)
Glucose-Capillary: 232 mg/dL — ABNORMAL HIGH (ref 65–99)

## 2016-01-02 LAB — BASIC METABOLIC PANEL WITH GFR
Anion gap: 3 — ABNORMAL LOW (ref 5–15)
BUN: 36 mg/dL — ABNORMAL HIGH (ref 6–20)
CO2: 27 mmol/L (ref 22–32)
Calcium: 7.9 mg/dL — ABNORMAL LOW (ref 8.9–10.3)
Chloride: 108 mmol/L (ref 101–111)
Creatinine, Ser: 0.92 mg/dL (ref 0.61–1.24)
GFR calc Af Amer: >60 mL/min
GFR calc non Af Amer: >60 mL/min
Glucose, Bld: 210 mg/dL — ABNORMAL HIGH (ref 65–99)
Potassium: 4.2 mmol/L (ref 3.5–5.1)
Sodium: 138 mmol/L (ref 135–145)

## 2016-01-02 MED ORDER — VITAL HIGH PROTEIN PO LIQD
1000.0000 mL | ORAL | Status: DC
  Administered 2016-01-02: 1000 mL

## 2016-01-02 MED ORDER — TRACE MINERALS CR-CU-MN-SE-ZN 10-1000-500-60 MCG/ML IV SOLN
INTRAVENOUS | Status: AC
  Administered 2016-01-02: 19:00:00 via INTRAVENOUS
  Filled 2016-01-02: qty 1992

## 2016-01-02 NOTE — Progress Notes (Signed)
Nutrition Follow-up    INTERVENTION:   EN: received verbal order from MD Kasa to start TF today, start at noon today per postop orders. Recommend starting Vital High Protein at rate of 15 ml/hr, no titration at present. If tolerating, initial goal rate of 55 ml/hr, meets 100% estimated calorie and protein needs PN: discussed TPN plan with MD Kasa; plan to continue TPN at goal rate today. If tolerates TF, can titrate TPN off over the weekend. Continue to assess   NUTRITION DIAGNOSIS:   Inadequate oral intake related to inability to eat as evidenced by NPO status. Being addressed via TPN and TF  GOAL:   Patient will meet greater than or equal to 90% of their needs  MONITOR:    (Energy Intake, Electrolyte and renal Profile, Anthropometrics, Digestive System, Glucose Profile, Pulmonary Profile)  REASON FOR ASSESSMENT:   Consult New TPN/TNA  ASSESSMENT:   Pt remains on vent via trach, s/p PEG placement by IR yesterday  Diet Order:  Diet NPO time specified .TPN (CLINIMIX-E) Adult .TPN (CLINIMIX-E) Adult   PN: 5%AA/15%Dextrose at rate of 83 ml/hr via PICC  Skin:   (stage II pressure ulcer on sacrum and lip)    Recent Labs Lab 12/30/15 0452 12/31/15 0458 01/01/16 0538 01/02/16 0805  NA 146* 144 139 138  K 4.5 4.7 4.4 4.2  CL 114* 112* 108 108  CO2 '28 26 27 27  '$ BUN 36* 36* 34* 36*  CREATININE 1.08 0.99 0.96 0.92  CALCIUM 8.2* 8.0* 8.0* 7.9*  MG 1.9 2.1 1.9  --   PHOS 3.7 3.8 3.7  --   GLUCOSE 190* 156* 175* 210*    Glucose Profile:  Recent Labs  01/02/16 0848 01/02/16 0955 01/02/16 1022  GLUCAP 171* 124* 108*   Meds: insulin drip, fentanyl  Height:   Ht Readings from Last 1 Encounters:  12/29/15 '6\' 4"'$  (1.93 m)    Weight:   Wt Readings from Last 1 Encounters:  01/02/16 143 lb 4.8 oz (65 kg)   Filed Weights   12/31/15 0508 01/01/16 0523 01/02/16 0500  Weight: 138 lb 10.7 oz (62.9 kg) 142 lb 13.7 oz (64.8 kg) 143 lb 4.8 oz (65 kg)    BMI:  Body  mass index is 17.45 kg/(m^2).  Estimated Nutritional Needs:   Kcal:  1371 kcals (Ve: 8.7, Tmax: 38.4) using wt of 59 kg, length of 44 inches  Protein:  89-118 g (1.5-2.0 g/kg)   Fluid:  1770-2065 mL (30-35 ml/kg)   EDUCATION NEEDS:   No education needs identified at this time  [place], RD, LDN [phone] Pager  [phone] Weekend/On-Call Pager

## 2016-01-02 NOTE — Progress Notes (Signed)
PARENTERAL NUTRITION CONSULT NOTE - FOLLOW UP   Pharmacy Consult for TPN Electrolyte and Glucose Monitoirng  Indication: Bowel Perforation  No Known Allergies  Patient Measurements: Height: '6\' 4"'$  (193 cm) Weight: 143 lb 4.8 oz (65 kg) IBW/kg (Calculated) : 86.8   Vital Signs: Temp: 97.5 F (36.4 C) (03/10 0800) Temp Source: Rectal (03/10 0800) BP: 132/77 mmHg (03/10 0800) Intake/Output from previous day: 03/09 0701 - 03/10 0700 In: 5467.5 [I.V.:1183.5; IV Piggyback:300; BPZ:0258] Out: 2575 [Urine:2575] Intake/Output from this shift: Total I/O In: 213.7 [I.V.:47.7; TPN:166] Out: -   Labs:  Recent Labs  12/31/15 0458  WBC 13.7*  HGB 8.9*  HCT 28.9*  PLT 250     Recent Labs  12/31/15 0458 01/01/16 0538 01/02/16 0805  NA 144 139 138  K 4.7 4.4 4.2  CL 112* 108 108  CO2 '26 27 27  '$ GLUCOSE 156* 175* 210*  BUN 36* 34* 36*  CREATININE 0.99 0.96 0.92  CALCIUM 8.0* 8.0* 7.9*  MG 2.1 1.9  --   PHOS 3.8 3.7  --    Estimated Creatinine Clearance: 68.7 mL/min (by C-G formula based on Cr of 0.92).    Recent Labs  01/02/16 0640 01/02/16 0723 01/02/16 0848  GLUCAP 211* 209* 171*    Medical History: Past Medical History  Diagnosis Date  . Hyperlipidemia   . Hypertension   . Diabetes mellitus ([place])   . CAD (coronary artery disease)     a. s/p 2 v CABG in 2012 (LIMA-LAD & SVG-OM); b. cath 07/2012 s/p PCI/DES to ostial LCx and OM  . PAD (peripheral artery disease) ([place])     a. s/p right SFA stent; b. s/p right toe amputation 2011; c. s/p right AKA spring 2016  . Seizures ([place])   . Gangrene of foot ([place])   . Wheezing   . Lung cancer ([place])   . Prostate cancer ([place])   . Stroke [facility])     Medications:  Scheduled:  . antiseptic oral rinse  7 mL Mouth Rinse 10 times per day  . chlorhexidine gluconate  15 mL Mouth Rinse BID  . collagenase   Topical Daily  . feeding supplement (VITAL HIGH PROTEIN)  1,000 mL Per Tube Q24H  . free water  30 mL Per Tube 6  times per day  . pantoprazole (PROTONIX) IV  40 mg Intravenous Q24H  . piperacillin-tazobactam (ZOSYN)  IV  3.375 g Intravenous 3 times per day   Infusions:  . Marland KitchenTPN (CLINIMIX-E) Adult    . amiodarone 30 mg/hr (01/01/16 1606)  . fentaNYL 10 mcg/ml infusion 300 mcg/hr (01/01/16 1909)  . insulin (NOVOLIN-R) infusion 4.7 mL/hr at 01/02/16 0800  . norepinephrine (LEVOPHED) Adult infusion Stopped (12/26/15 2151)    Insulin Requirements in the past 24 hours: On continuous drip.    Current Nutrition:  Clinimix E 5/15 at 83 mL/min   Assessment: 70 y/o M with multiple medical problems including bowel perforation.   Plan:  1. Electrolytes: WNL. No supplementation warranted @ this time. Electrolyte panel with am labs.   2. Insulin: Patient failed trial off insulin drip. Plan is to continue patient on insulin drip.   Pharmacy will continue to monitor and adjust per consult.   Che Below D 01/02/2016,8:51 AM

## 2016-01-02 NOTE — Progress Notes (Signed)
Pt is in stable condition at this time.  No signs of pain using CPOT scale.  Pt family updated by Magdalene,N.P..  Report given to [facility], on coming R.N..

## 2016-01-02 NOTE — Progress Notes (Signed)
[place] INFECTIOUS DISEASE PROGRESS NOTE Date of Admission:  12/05/2015     ID: Stephen Camacho is a 70 y.o. male with Active Problems:   Ischemic leg   Acute respiratory failure (HCC)   Ischemia of lower extremity   Altered mental status   Hematoma of neck   Occlusion of arterial bypass graft (HCC)   NSTEMI (non-ST elevated myocardial infarction) (HCC)   Severe sepsis (HCC)   Urinary retention   Aspiration into respiratory tract   Encounter for intubation   Pressure ulcer   Abdominal distension   Free intraperitoneal air   Ileus ([place])   Stroke ([place])   Cardiac arrest ([place])   PE (pulmonary embolism)   Subjective: Peg placed. Off vanco and eraxis. No fevers since 3/5  Unsuccessful peg placement  ROS  Unable to obtain  Medications:  Antibiotics Given (last 72 hours)    Date/Time Action Medication Dose Rate   12/30/15 1917 Given   vancomycin (VANCOCIN) IVPB 1000 mg/200 mL premix 1,000 mg 200 mL/hr   12/30/15 2148 Given   piperacillin-tazobactam (ZOSYN) IVPB 3.375 g 3.375 g 12.5 mL/hr   12/31/15 0525 Given   piperacillin-tazobactam (ZOSYN) IVPB 3.375 g 3.375 g 12.5 mL/hr   12/31/15 1157 Given   vancomycin (VANCOCIN) IVPB 1000 mg/200 mL premix 1,000 mg 200 mL/hr   12/31/15 1504 Given   vancomycin (VANCOCIN) IVPB 750 mg/150 ml premix 750 mg 150 mL/hr   12/31/15 1851 Given   piperacillin-tazobactam (ZOSYN) IVPB 3.375 g 3.375 g 12.5 mL/hr   01/01/16 0330 Given   piperacillin-tazobactam (ZOSYN) IVPB 3.375 g 3.375 g 12.5 mL/hr   01/01/16 6389 Given   vancomycin (VANCOCIN) IVPB 750 mg/150 ml premix 750 mg 150 mL/hr   01/01/16 1246 Given   piperacillin-tazobactam (ZOSYN) IVPB 3.375 g 3.375 g 12.5 mL/hr   01/01/16 1930 Given   piperacillin-tazobactam (ZOSYN) IVPB 3.375 g 3.375 g 12.5 mL/hr   01/02/16 0330 Given   piperacillin-tazobactam (ZOSYN) IVPB 3.375 g 3.375 g 12.5 mL/hr   01/02/16 1204 Given   piperacillin-tazobactam (ZOSYN) IVPB 3.375 g 3.375 g 12.5 mL/hr      . antiseptic oral rinse  7 mL Mouth Rinse 10 times per day  . chlorhexidine gluconate  15 mL Mouth Rinse BID  . collagenase   Topical Daily  . feeding supplement (VITAL HIGH PROTEIN)  1,000 mL Per Tube Q24H  . free water  30 mL Per Tube 6 times per day  . pantoprazole (PROTONIX) IV  40 mg Intravenous Q24H  . piperacillin-tazobactam (ZOSYN)  IV  3.375 g Intravenous 3 times per day    Objective: Vital signs in last 24 hours: Temp:  [97.5 F (36.4 C)-98.4 F (36.9 C)] 98.4 F (36.9 C) (03/10 1200) Resp:  [13-23] 23 (03/10 1200) BP: (104-161)/(59-104) 161/100 mmHg (03/10 1200) SpO2:  [95 %-100 %] 98 % (03/10 1554) FiO2 (%):  [30 %] 30 % (03/10 1554) Weight:  [65 kg (143 lb 4.8 oz)] 65 kg (143 lb 4.8 oz) (03/10 0500) Constitutional: trachced, critically ill appearing HENT: anicteric Mouth/Throat: trach in place  R neck IJ -removed.  PICC LUEwnl  Cardiovascular: Normal rate, regular rhythm and normal heart sounds. Pulmonary/Chest: mech BS Abdominal: Soft. Bowel sounds are normal. He exhibits no distension. There is no tenderness.  Lymphadenopathy: He has no cervical adenopathy.  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 No rash noted. No erythema.  Psychiatric: intubated  Lab Results  Recent Labs  12/31/15 0458 01/01/16 0538 01/02/16 0805  WBC 13.7*  --   --   HGB 8.9*  --   --   HCT 28.9*  --   --   NA 144 139 138  K 4.7 4.4 4.2  CL 112* 108 108  CO2 26 27 27   BUN 36* 34* 36*  CREATININE 0.99 0.96 0.92    Microbiology: Results for orders placed or performed during the hospital encounter of 12/05/15  Culture, respiratory (NON-Expectorated)     Status: None   Collection Time: 12/06/15  4:05 PM  Result Value Ref Range Status   Specimen Description NASAL SWAB  Final   Special Requests NONE  Final   Gram Stain RARE WBC SEEN FEW GRAM POSITIVE COCCI   Final   Culture HEAVY GROWTH STAPHYLOCOCCUS AUREUS  Final    Report Status 12/09/2015 FINAL  Final   Organism ID, Bacteria STAPHYLOCOCCUS AUREUS  Final      Susceptibility   Staphylococcus aureus - MIC*    CIPROFLOXACIN 2 INTERMEDIATE Intermediate     GENTAMICIN <=0.5 SENSITIVE Sensitive     OXACILLIN <=0.25 SENSITIVE Sensitive     TETRACYCLINE <=1 SENSITIVE Sensitive     VANCOMYCIN 1 SENSITIVE Sensitive     TRIMETH/SULFA <=10 SENSITIVE Sensitive     RIFAMPIN <=0.5 SENSITIVE Sensitive     Inducible Clindamycin NEGATIVE Sensitive     * HEAVY GROWTH STAPHYLOCOCCUS AUREUS  Culture, blood (Routine X 2) w Reflex to ID Panel     Status: None   Collection Time: 12/07/15 12:09 AM  Result Value Ref Range Status   Specimen Description BLOOD BLOOD RIGHT HAND  Final   Special Requests BOTTLES DRAWN AEROBIC AND ANAEROBIC 10CC  Final   Culture NO GROWTH 6 DAYS  Final   Report Status 12/13/2015 FINAL  Final  Culture, blood (Routine X 2) w Reflex to ID Panel     Status: None   Collection Time: 12/07/15 12:09 AM  Result Value Ref Range Status   Specimen Description BLOOD BLOOD LEFT HAND  Final   Special Requests BOTTLES DRAWN AEROBIC AND ANAEROBIC 10CC  Final   Culture NO GROWTH 6 DAYS  Final   Report Status 12/13/2015 FINAL  Final  Urine culture     Status: None   Collection Time: 12/07/15 12:11 AM  Result Value Ref Range Status   Specimen Description URINE, CLEAN CATCH  Final   Special Requests NONE  Final   Culture NO GROWTH 1 DAY  Final   Report Status 12/08/2015 FINAL  Final  MRSA PCR Screening     Status: None   Collection Time: 12/08/15  9:12 AM  Result Value Ref Range Status   MRSA by PCR NEGATIVE NEGATIVE Final    Comment:        The GeneXpert MRSA Assay (FDA approved for NASAL specimens only), is one component of a comprehensive MRSA colonization surveillance program. It is not intended to diagnose MRSA infection nor to guide or monitor treatment for MRSA infections.   C difficile quick scan w PCR reflex     Status: None    Collection Time: 12/11/15  1:54 PM  Result Value Ref Range Status   C Diff antigen NEGATIVE NEGATIVE Final   C Diff toxin NEGATIVE NEGATIVE Final   C Diff interpretation Negative for C. difficile  Final  Culture, blood (Routine X 2) w Reflex to ID Panel     Status: None   Collection Time: 12/13/15  9:33 AM  Result  Value Ref Range Status   Specimen Description BLOOD LEFT ANTECUBITAL  Final   Special Requests BOTTLES DRAWN AEROBIC AND ANAEROBIC  4CC  Final   Culture NO GROWTH 5 DAYS  Final   Report Status 12/18/2015 FINAL  Final  Culture, blood (Routine X 2) w Reflex to ID Panel     Status: None   Collection Time: 12/13/15  9:41 AM  Result Value Ref Range Status   Specimen Description BLOOD RIGHT ARM  Final   Special Requests BOTTLES DRAWN AEROBIC AND ANAEROBIC  2CC  Final   Culture NO GROWTH 5 DAYS  Final   Report Status 12/18/2015 FINAL  Final  Urine culture     Status: None   Collection Time: 12/13/15 11:01 AM  Result Value Ref Range Status   Specimen Description URINE, CATHETERIZED  Final   Special Requests NONE  Final   Culture NO GROWTH 1 DAY  Final   Report Status 12/14/2015 FINAL  Final  Culture, expectorated sputum-assessment     Status: None   Collection Time: 12/13/15 11:30 AM  Result Value Ref Range Status   Specimen Description TRACHEAL ASPIRATE  Final   Special Requests NONE  Final   Sputum evaluation THIS SPECIMEN IS ACCEPTABLE FOR SPUTUM CULTURE  Final   Report Status 12/13/2015 FINAL  Final  Culture, respiratory (NON-Expectorated)     Status: None   Collection Time: 12/13/15 11:30 AM  Result Value Ref Range Status   Specimen Description TRACHEAL ASPIRATE  Final   Special Requests NONE Reflexed from [id number]  Final   Gram Stain   Final    FEW WBC SEEN FEW GRAM POSITIVE COCCI FEW YEAST FAIR SPECIMEN - 70-80% WBCS    Culture LIGHT GROWTH CANDIDA ALBICANS  Final   Report Status 12/17/2015 FINAL  Final  Cath Tip Culture     Status: None   Collection Time:  12/14/15 12:47 PM  Result Value Ref Range Status   Specimen Description CATH TIP  Final   Special Requests NONE  Final   Culture NO GROWTH 3 DAYS  Final   Report Status 12/17/2015 FINAL  Final  Urine culture     Status: None   Collection Time: 12/16/15  8:19 AM  Result Value Ref Range Status   Specimen Description URINE, RANDOM  Final   Special Requests Normal  Final   Culture NO GROWTH 1 DAY  Final   Report Status 12/17/2015 FINAL  Final  CULTURE, BLOOD (ROUTINE X 2) w Reflex to PCR ID Panel     Status: None   Collection Time: 12/16/15  8:28 AM  Result Value Ref Range Status   Specimen Description BLOOD LEFT ARM  Final   Special Requests BOTTLES DRAWN AEROBIC AND ANAEROBIC 10ML  Final   Culture NO GROWTH 5 DAYS  Final   Report Status 12/21/2015 FINAL  Final  CULTURE, BLOOD (ROUTINE X 2) w Reflex to PCR ID Panel     Status: None   Collection Time: 12/16/15  8:45 AM  Result Value Ref Range Status   Specimen Description BLOOD RIGHT ARM  Final   Special Requests BOTTLES DRAWN AEROBIC AND ANAEROBIC 10ML  Final   Culture NO GROWTH 5 DAYS  Final   Report Status 12/21/2015 FINAL  Final  C difficile quick scan w PCR reflex     Status: None   Collection Time: 12/16/15  4:26 PM  Result Value Ref Range Status   C Diff antigen NEGATIVE NEGATIVE Final   C Diff  toxin NEGATIVE NEGATIVE Final   C Diff interpretation Negative for C. difficile  Final  Culture, expectorated sputum-assessment     Status: None   Collection Time: 12/16/15  5:32 PM  Result Value Ref Range Status   Specimen Description SPUTUM  Final   Special Requests NONE  Final   Sputum evaluation THIS SPECIMEN IS ACCEPTABLE FOR SPUTUM CULTURE  Final   Report Status 12/16/2015 FINAL  Final  Culture, respiratory (NON-Expectorated)     Status: None   Collection Time: 12/16/15  5:32 PM  Result Value Ref Range Status   Specimen Description SPUTUM  Final   Special Requests NONE Reflexed from [id number]  Final   Gram Stain   Final     MANY WBC SEEN RARE SQUAMOUS EPITHELIAL CELLS PRESENT FEW YEAST EXCELLENT SPECIMEN - 90-100% WBCS    Culture MODERATE GROWTH CANDIDA ALBICANS  Final   Report Status 12/20/2015 FINAL  Final  MRSA PCR Screening     Status: None   Collection Time: 12/17/15 11:50 AM  Result Value Ref Range Status   MRSA by PCR NEGATIVE NEGATIVE Final    Comment:        The GeneXpert MRSA Assay (FDA approved for NASAL specimens only), is one component of a comprehensive MRSA colonization surveillance program. It is not intended to diagnose MRSA infection nor to guide or monitor treatment for MRSA infections.   Culture, respiratory (NON-Expectorated)     Status: None   Collection Time: 12/18/15 12:15 AM  Result Value Ref Range Status   Specimen Description TRACHEAL ASPIRATE  Final   Special Requests NONE  Final   Gram Stain   Final    GOOD SPECIMEN - 80-90% WBCS RARE SQUAMOUS EPITHELIAL CELLS PRESENT MANY WBC SEEN FEW YEAST    Culture LIGHT GROWTH CANDIDA ALBICANS  Final   Report Status 12/21/2015 FINAL  Final  Urine culture     Status: None   Collection Time: 12/18/15 12:24 AM  Result Value Ref Range Status   Specimen Description URINE, RANDOM  Final   Special Requests NONE  Final   Culture NO GROWTH 1 DAY  Final   Report Status 12/19/2015 FINAL  Final  Culture, blood (Routine X 2) w Reflex to ID Panel     Status: None   Collection Time: 12/18/15  4:29 AM  Result Value Ref Range Status   Specimen Description BLOOD RIGHT HAND  Final   Special Requests BOTTLES DRAWN AEROBIC AND ANAEROBIC 5ML  Final   Culture NO GROWTH 5 DAYS  Final   Report Status 12/23/2015 FINAL  Final  Culture, blood (Routine X 2) w Reflex to ID Panel     Status: None   Collection Time: 12/18/15  4:37 AM  Result Value Ref Range Status   Specimen Description BLOOD LEFT ARM  Final   Special Requests BOTTLES DRAWN AEROBIC AND ANAEROBIC 10ML  Final   Culture NO GROWTH 5 DAYS  Final   Report Status 12/23/2015 FINAL  Final   Culture, respiratory (NON-Expectorated)     Status: None   Collection Time: 12/21/15  9:29 AM  Result Value Ref Range Status   Specimen Description TRACHEAL ASPIRATE  Final   Special Requests NONE  Final   Gram Stain   Final    MODERATE WBC SEEN FEW YEAST GOOD SPECIMEN - 80-90% WBCS    Culture MODERATE GROWTH CANDIDA ALBICANS  Final   Report Status 12/24/2015 FINAL  Final    Studies/Results: Ir Gastrostomy Tube Mod Sed  01/01/2016  CLINICAL DATA:  Myocardial infarction, cerebral infarctions and respiratory failure. The patient requires a percutaneous gastrostomy tube for nutritional needs. EXAM: PERCUTANEOUS GASTROSTOMY TUBE PLACEMENT ANESTHESIA/SEDATION: None CONTRAST:  71m OMNIPAQUE IOHEXOL 300 MG/ML  SOLN MEDICATIONS: The patient had just received a scheduled dose of IV Zosyn just prior to the procedure. FLUOROSCOPY TIME:  2 minutes. PROCEDURE: The procedure, risks, benefits, and alternatives were explained to the patient's daughter. Questions regarding the procedure were encouraged and answered. The patient's daughter understands and consents to the procedure. A 5-French catheter was then advanced through the the patient's mouth under fluoroscopy into the esophagus and to the level of the stomach. This catheter was used to insufflate the stomach with air under fluoroscopy. The abdominal wall was prepped with chlorhexidine in a sterile fashion, and a sterile drape was applied covering the operative field. A sterile gown and sterile gloves were used for the procedure. Local anesthesia was provided with 1% Lidocaine. A skin incision was made in the upper abdominal wall. Under fluoroscopy, an 18 gauge trocar needle was advanced into the stomach. Contrast injection was performed to confirm intraluminal position of the needle tip. A single T tack was then deployed in the lumen of the stomach. This was brought up to tension at the skin surface. Over a guidewire, a 9-French sheath was advanced into the  lumen of the stomach. The wire was left in place as a safety wire. A loop snare device from a percutaneous gastrostomy kit was then advanced into the stomach. A floppy guide wire was advanced through the orogastric catheter under fluoroscopy in the stomach. The loop snare advanced through the percutaneous gastric access was used to snare the guide wire. This allowed withdrawal of the loop snare out of the patient's mouth by retraction of the orogastric catheter and wire. A 20-French bumper retention gastrostomy tube was looped around the snare device. It was then pulled back through the patient's mouth. The retention bumper was brought up to the anterior gastric wall. The T tack suture was cut at the skin. The exiting gastrostomy tube was cut to appropriate length and a feeding adapter applied. The catheter was injected with contrast material to confirm position and a fluoroscopic spot image saved. The tube was then flushed with saline. A dressing was applied over the gastrostomy exit site. COMPLICATIONS: None. FINDINGS: The stomach distended well with air allowing safe placement of the gastrostomy tube. After placement, the tip of the gastrostomy tube lies in the body of the stomach. IMPRESSION: Percutaneous gastrostomy with placement of a 20-French bumper retention tube in the body of the stomach. Electronically Signed   By: GAletta EdouardM.D.   On: 01/01/2016 16:30    Assessment/Plan: Assessment:  MBravlio Lucais a 70y.o. male ith hx PAD, CAD, DM, admitted with L foot pain and found to have ischemia and occlusion of Fem Pop bypass. 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 Has had persistent fevers and leukocytosis.  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 Has been on abx since admit with zosyn and vanco from 2/11- 2/19. Was off abx for one day 2/20 there restarted ceftazidime and vanco. Cultures with sputum growing MSSA from 2/11 otherwise candida on trach aspirate.  Amputation site looks very  good so doubt source of elevated wbc  IJ line removed, Picc place. May have perfed bowel .  Peg and trach placaed  Recommendations Cont zosyn for intrabdominal process for 10 total days past the CT which showed the pneumoperitoneum (done  3./1) No further evidence of perfed bowel and clinically stabilizing Would dc zosyn 3/11 and then monitor for elevated wbc, fevers or HD instability  Thank you very much for the consult. Will follow with you.  [place], [place]   01/02/2016, 6:23 PM

## 2016-01-02 NOTE — Progress Notes (Signed)
Pt spontaneous weaning trial ended due to pt RR of 39 and increased work of breathing. Pt RR remains elevated in the 30's and pt is agitated and restless. Will restart Fentanyl at 50 mcg/kg/min for comfort to achieve RASS -1

## 2016-01-02 NOTE — Progress Notes (Signed)
Consulted pharmacy, [facility], about what IV drips I can combine due to limited access.  Per pharmacy it is ok to combined Insulin drip and TPN.

## 2016-01-02 NOTE — Progress Notes (Signed)
SUBJECTIVE: Remains intubated   Filed Vitals:   01/02/16 0400 01/02/16 0500 01/02/16 0600 01/02/16 0700  BP: 128/70 155/84 144/80 107/59  Pulse:      Temp: 97.7 F (36.5 C) 97.7 F (36.5 C) 97.7 F (36.5 C) 97.5 F (36.4 C)  TempSrc:      Resp: '16 20 18 15  '$ Height:      Weight:  143 lb 4.8 oz (65 kg)    SpO2: 97% 96% 98% 99%    Intake/Output Summary (Last 24 hours) at 01/02/16 0825 Last data filed at 01/02/16 0600  Gross per 24 hour  Intake 4994.35 ml  Output   2575 ml  Net 2419.35 ml    LABS: Basic Metabolic Panel:  Recent Labs  12/31/15 0458 01/01/16 0538  NA 144 139  K 4.7 4.4  CL 112* 108  CO2 26 27  GLUCOSE 156* 175*  BUN 36* 34*  CREATININE 0.99 0.96  CALCIUM 8.0* 8.0*  MG 2.1 1.9  PHOS 3.8 3.7   Liver Function Tests: No results for input(s): AST, ALT, ALKPHOS, BILITOT, PROT, ALBUMIN in the last 72 hours. No results for input(s): LIPASE, AMYLASE in the last 72 hours. CBC:  Recent Labs  12/31/15 0458  WBC 13.7*  HGB 8.9*  HCT 28.9*  MCV 92.8  PLT 250   Cardiac Enzymes: No results for input(s): CKTOTAL, CKMB, CKMBINDEX, TROPONINI in the last 72 hours. BNP: Invalid input(s): POCBNP D-Dimer: No results for input(s): DDIMER in the last 72 hours. Hemoglobin A1C: No results for input(s): HGBA1C in the last 72 hours. Fasting Lipid Panel: No results for input(s): CHOL, HDL, LDLCALC, TRIG, CHOLHDL, LDLDIRECT in the last 72 hours. Thyroid Function Tests: No results for input(s): TSH, T4TOTAL, T3FREE, THYROIDAB in the last 72 hours.  Invalid input(s): FREET3 Anemia Panel: No results for input(s): VITAMINB12, FOLATE, FERRITIN, TIBC, IRON, RETICCTPCT in the last 72 hours.   PHYSICAL EXAM General: Well developed, well nourished, in no acute distress HEENT:  Normocephalic and atramatic Neck:  No JVD.  Lungs: Clear bilaterally to auscultation and percussion. Heart: HRRR . Normal S1 and S2 without gallops or murmurs.  Abdomen: Bowel sounds are  positive, abdomen soft and non-tender  Msk:  Back normal, normal gait. Normal strength and tone for age. Extremities: No clubbing, cyanosis or edema.   Neuro: Alert and oriented X 3. Psych:  Good affect, responds appropriately  TELEMETRY: Patient is in sinus rhythm  ASSESSMENT AND PLAN: Status post tracheostomy with respiratory failure and non-STEMI with left radical ejection fraction 41%. 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 Advise conservative treatment and we will sign off.  Active Problems:   Ischemic leg   Acute respiratory failure (HCC)   Ischemia of lower extremity   Altered mental status   Hematoma of neck   Occlusion of arterial bypass graft (HCC)   NSTEMI (non-ST elevated myocardial infarction) (HCC)   Severe sepsis (HCC)   Urinary retention   Aspiration into respiratory tract   Encounter for intubation   Pressure ulcer   Abdominal distension   Free intraperitoneal air   Ileus ([place])   Stroke ([place])   Cardiac arrest ([place])   PE (pulmonary embolism)    Neoma Laming A, MD, [facility] 01/02/2016 8:25 AM

## 2016-01-02 NOTE — Progress Notes (Signed)
[facility]  Daily Progress Note   Subjective    He now follows commands he remains intubated he did have successful tracheostomy.  Yesterday he had a successful G-tube placement.  He seems more alert but is not purposeful  Objective Filed Vitals:   01/02/16 1000 01/02/16 1100 01/02/16 1200 01/02/16 1554  BP:  150/83 161/100   Pulse:      Temp: 97.5 F (36.4 C) 98.2 F (36.8 C) 98.4 F (36.9 C)   TempSrc:   Rectal   Resp: '13 18 23   '$ Height:      Weight:      SpO2: 100% 99% 98% 98%    Intake/Output Summary (Last 24 hours) at 01/02/16 1648 Last data filed at 01/02/16 0800  Gross per 24 hour  Intake 5203.68 ml  Output   1750 ml  Net 3453.68 ml    PULM  tracheostomy present no bleeding, patient is ventilated , no use of accessory muscles CV  No JVD, RRR Abd      mild distended, nontender, G-tube in place VASC  left above-knee amputation stump clean dry and intact  Laboratory CBC    Component Value Date/Time   WBC 13.7* 12/31/2015 0458   WBC 4.9 10/25/2014 1331   HGB 8.9* 12/31/2015 0458   HGB 12.1* 10/25/2014 1331   HCT 28.9* 12/31/2015 0458   HCT 38.1* 10/25/2014 1331   PLT 250 12/31/2015 0458   PLT 151 10/25/2014 1331    BMET    Component Value Date/Time   NA 138 01/02/2016 0805   NA 138 10/25/2014 1331   K 4.2 01/02/2016 0805   K 3.9 10/25/2014 1331   CL 108 01/02/2016 0805   CL 109* 10/25/2014 1331   CO2 27 01/02/2016 0805   CO2 21 10/25/2014 1331   GLUCOSE 210* 01/02/2016 0805   GLUCOSE 232* 10/25/2014 1331   BUN 36* 01/02/2016 0805   BUN 17 10/25/2014 1331   CREATININE 0.92 01/02/2016 0805   CREATININE 1.11 10/25/2014 1331   CALCIUM 7.9* 01/02/2016 0805   CALCIUM 8.7 10/25/2014 1331   GFRNONAA >60 01/02/2016 0805   GFRNONAA >60 10/25/2014 1331   GFRNONAA 52* 07/08/2014 1051   GFRAA >60 01/02/2016 0805   GFRAA >60 10/25/2014 1331   GFRAA >60 07/08/2014 1051    Assessment/Planning:    Family continues to wish  everything be done patient is therefore had successful tracheostomy today and later this week we will address the issue of enteral feeds and/or a G-tube was done yesterday. Plan to move forward with LTAC.  His overall continued condition is grave his multiple cardiac events and his elevated white count suggest that his prognosis is quite poor family has been informed of this.    [name]  01/02/2016, 4:48 PM

## 2016-01-02 NOTE — Progress Notes (Signed)
Inpatient Diabetes Program Recommendations  AACE/ADA: New Consensus Statement on Inpatient Glycemic Control (2015)  Target Ranges:  Prepandial:   less than 140 mg/dL      Peak postprandial:   less than 180 mg/dL (1-2 hours)      Critically ill patients:  140 - 180 mg/dL   Review of Glycemic Control  Results for Stephen Camacho, Stephen Camacho (MRN [id number]) as of 01/02/2016 07:54  Ref. Range 01/02/2016 04:24 01/02/2016 05:03 01/02/2016 05:32 01/02/2016 06:40 01/02/2016 07:23  Glucose-Capillary Latest Ref Range: 65-99 mg/dL 140 (H) 143 (H) 161 (H) 211 (H) 209 (H)    Diabetes history: DM2 Outpatient Diabetes medications: Lantus 10 units QHS, Metformin 500 mg BID Current orders for Inpatient glycemic control: IV insulin via glucostabilizer currently at 4.7units /hour. PEG tube placed- feeds have begun with a slight rise in insulin needs (as expected).   If patient is transitioned off the infusion, follow ICU Glycemic Control order guidelines for basal, bolus, and tube feed insulin coverage doses. Give basal insulin (Lantus) 2 hours before the insulin drip is stopped.  Gentry Fitz, RN, BA, MHA, CDE Diabetes Coordinator Inpatient Diabetes Program  [phone] (Team Pager) [phone] ([place]) 01/02/2016 7:55 AM

## 2016-01-02 NOTE — Progress Notes (Signed)
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 Now with acute respiratory failure, intubated due to pneumonia. Underwent a left AKA s/p trach and s/p g-tube placed by IR  SUBJECTIVE:  S/p trach, remains on Vent,  Zosyn/Vanc/antfungal therapy for complication from PEG tube placement s/p bowel/stomach perforation and candida in sputum Prognosis is very poor No issues overnight-plan for G tube placement by IR yesterday.  VITAL SIGNS: BP 132/77 mmHg  Pulse 86  Temp(Src) 97.5 F (36.4 C) (Rectal)  Resp 14  Ht 6' 4"  (1.93 m)  Wt 143 lb 4.8 oz (65 kg)  BMI 17.45 kg/m2  SpO2 99%  HEMODYNAMICS:    VENTILATOR SETTINGS: Vent Mode:  [-] PRVC FiO2 (%):  [30 %] 30 % Set Rate:  [15 bmp] 15 bmp Vt Set:  [500 mL] 500 mL PEEP:  [5 cmH20] 5 cmH20  INTAKE / OUTPUT: I/O last 3 completed shifts: In: 8720.6 [I.V.:1691.9; IV Piggyback:700] Out: 3725 [IYMEB:5830]  PHYSICAL EXAMINATION: General: Chronically ill appearing Neuro:GCS<8T HEENT: NCAT,s/p trach Cardiovascular: RRR, S1/S2, no MRG Lungs: Bilateral airflow, no wheezes Abdomen: no BS, distended abd s/p g tube Ext: Bilateral AKA, Left stump clean and dry  LABS:  BMET  Recent Labs Lab 12/31/15 0458 01/01/16 0538 01/02/16 0805  NA 144 139 138  K 4.7 4.4 4.2  CL 112* 108 108  CO2 26 27 27   BUN 36* 34* 36*  CREATININE 0.99 0.96 0.92  GLUCOSE 156* 175* 210*    Electrolytes  Recent Labs Lab 12/30/15 0452 12/31/15 0458 01/01/16 0538 01/02/16 0805  CALCIUM 8.2* 8.0* 8.0* 7.9*  MG 1.9 2.1 1.9  --   PHOS 3.7 3.8 3.7  --      CBC  Recent Labs Lab 12/29/15 0455 12/30/15 0452 12/31/15 0458  WBC 15.7* 14.7* 13.7*  HGB 9.6* 9.0* 8.9*  HCT 31.1* 29.0* 28.9*  PLT 408 326 250    Coag's  Recent Labs Lab 12/28/15 1156  APTT 38*  INR 1.35    Sepsis Markers  Recent Labs Lab 12/28/15 0012 12/28/15 0300  LATICACIDVEN 6.0* 2.2*    ABG  Recent Labs Lab 12/28/15 0416  PHART 7.41  PCO2ART 47  PO2ART 175*    Liver Enzymes  Recent Labs Lab 12/26/15 1109 12/27/15 0419 12/28/15 0012  AST 20 20 45*  ALT 31 26 29   ALKPHOS 101 111 96  BILITOT 1.1 0.8 0.7  ALBUMIN 1.8* 1.9* 1.6*    Cardiac Enzymes  Recent Labs Lab 12/28/15 0012  TROPONINI 0.27*    Glucose  Recent Labs Lab 01/02/16 0424 01/02/16 0503 01/02/16 0532 01/02/16 0640 01/02/16 0723 01/02/16 0848  GLUCAP 140* 143* 161* 211* 209* 171*   Ir Gastrostomy Tube Mod Sed  01/01/2016  CLINICAL DATA:  Myocardial infarction, cerebral infarctions and respiratory failure. The patient requires a percutaneous gastrostomy tube for nutritional needs. EXAM: PERCUTANEOUS GASTROSTOMY TUBE PLACEMENT ANESTHESIA/SEDATION: None CONTRAST:  74m OMNIPAQUE IOHEXOL 300 MG/ML  SOLN MEDICATIONS: The patient had just received a scheduled dose of IV Zosyn just prior to the procedure. FLUOROSCOPY TIME:  2 minutes. PROCEDURE: The procedure, risks, benefits, and alternatives were explained to the patient's daughter. Questions  regarding the procedure were encouraged and answered. The patient's daughter understands and consents to the procedure. A 5-French catheter was then advanced through the the patient's mouth under fluoroscopy into the esophagus and to the level of the stomach. This catheter was used to insufflate the stomach with air under fluoroscopy. The abdominal wall was prepped with chlorhexidine in a sterile fashion, and a sterile drape was applied covering the operative field. A sterile gown and sterile gloves were used for the procedure. Local  anesthesia was provided with 1% Lidocaine. A skin incision was made in the upper abdominal wall. Under fluoroscopy, an 18 gauge trocar needle was advanced into the stomach. Contrast injection was performed to confirm intraluminal position of the needle tip. A single T tack was then deployed in the lumen of the stomach. This was brought up to tension at the skin surface. Over a guidewire, a 9-French sheath was advanced into the lumen of the stomach. The wire was left in place as a safety wire. A loop snare device from a percutaneous gastrostomy kit was then advanced into the stomach. A floppy guide wire was advanced through the orogastric catheter under fluoroscopy in the stomach. The loop snare advanced through the percutaneous gastric access was used to snare the guide wire. This allowed withdrawal of the loop snare out of the patient's mouth by retraction of the orogastric catheter and wire. A 20-French bumper retention gastrostomy tube was looped around the snare device. It was then pulled back through the patient's mouth. The retention bumper was brought up to the anterior gastric wall. The T tack suture was cut at the skin. The exiting gastrostomy tube was cut to appropriate length and a feeding adapter applied. The catheter was injected with contrast material to confirm position and a fluoroscopic spot image saved. The tube was then flushed with saline. A dressing was applied over the gastrostomy exit site. COMPLICATIONS: None. FINDINGS: The stomach distended well with air allowing safe placement of the gastrostomy tube. After placement, the tip of the gastrostomy tube lies in the body of the stomach. IMPRESSION: Percutaneous gastrostomy with placement of a 20-French bumper retention tube in the body of the stomach. Electronically Signed   By: Aletta Edouard M.D.   On: 01/01/2016 16:30   EVENTS/DATA: 02/10 >> LLE angiogram, thrombolysis, thrombectomy, angioplasty 02/11 Intubated for suspected PNA 02/11  elevated trop I. Pk trop I 62.6 on 02/20 02/12 CT head: New nonhemorrhagic infarcts involving the right PCA territory and bilateral cerebellum, left greater than right 02/17 L AKA 02/18>extubated 02/21 TTE: LVEF 55-60% 02/21 RUE [place]: no DVT 02/21>Re-intubated 02/22 >CT head: Interval evolution of bilateral cerebellar and right PCA territory infarcts 02/27-remains intubated,discussion with family-proceed with trach and PEG 12/24/2015: Attempted EGD and PEG placement resulting in complication of bowel perforation 03/04-Asystole arrest; CPR x 12 minutes with ROSC 03/07 -trach placed 3/9 G tube placed  INDWELLING DEVICES:: ETT 02/11 >> 02/18, 02/21 >>  R IJ CVL 02/10 >> removed PICC line RT  MICRO DATA: MRSA PCR 02/13 >> NEG Urine 02/12 >> NEG Resp 02/11 >> MSSA C diff 02/16 >> NEG Blood 02/12 >> NEG Urine 02/19 >> NEG C diff 02/21 >> NEG Cath tip 02/19 >> NEG Resp 02/18 >> light growth candida Urine 02/21 >> NEG MRSA PCR 02/22 >> NEG Respiratory 02/26>>  ANTIMICROBIALS:  Vanc 02/11 >> 02/23 Pip-tazo 02/11 >> 02/20 Ceftaz 02/21 >> 02/24 Metronidazole 02/21 >> 02/24 Pip-tazo 02/24 >>  Vancomycin 02/25>>  ASSESSMENT / PLAN: 70 yo male  with 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 Now with acute respiratory failure, intubated due to pneumonia. Underwent a left AKA 02/17, failed extubation and prolonged ventilator dependence, bowel perforation s/p peg tube placement attempt, and cardiac arrest.  Given his multiple co-morbidities and frail status, his overall prognosis is very poor.  PULMONARY A: Prolonged VDRF Failed extubation attempt-s/p trach Bilateral pulmonary infiltrates and fever- c/w  PNA-resolving P:   -Cont vent support -settings reviewed -Cont vent  bundle -plan for PS mode trial -will wean off fentanyl and assess -Antibiotics as above  -CXR as needed -s/p Trach -remains on vent  CARDIOVASCULAR A:  Cardiac arrest Recurrent NSTEMI PAF, NSR presently NSVT Acute CHF with proBNP>45000; Echo at bedside revealed ejection fraction 41% with moderate to severe hypokinesis of the septum and inferior wall moderate mitral regurgitation and severe pulmonary hypertension with PA systolic pressure 67 mmHg S/p cardiac arrest-prognosis is poor P:  -Heparin gtt stopped -MAP goal > 65 mmHg -Cont ASA -Continue amiodarone  -Hemodynamics per ICU protocol  RENAL A:   AKI, nonoliguric-improved  Very poor candidate for HD P:   -Monitor BMET intermittently -Monitor I/Os -Correct electrolytes as indicated  GASTROINTESTINAL A:   No issues P:   - PPI - bowel/stomach perforation -Continue TPN -g tube placement  HEMATOLOGIC A:   ICU acquired anemia without acute blood loss P:  -DVT px: SQ heparin -Monitor CBC intermittently -Transfuse per usual guidelines   INFECTIOUS A:   Severe sepsis-persistent leukocytosis; no fever in 24hrs LLE gangrene Aspiration PNA P:   -Vanco and zosyn, antifungal therapy - Repeat cultures if febrile -Tylenol and motrin prn for fever >103 - Follow up ID recs  ENDOCRINE A:   Type 2 DM-Persistent hyperglycemia P:   -on insulin infusion -Continue blood gluocose monitoring per ICU protocol  NEUROLOGIC A:   Multifocal CVAs, acute Acute encewphalopathy H/O seizures - not on chronic AEDs PTA P:   RASS goal: -1, -2 -Fentanyl gtt and prn versed for vent sedation  I have personally obtained a history, examined the patient, evaluated Pertinent laboratory and RadioGraphic/imaging results, and  formulated the assessment and plan  The Patient requires high complexity decision making for assessment and support, frequent evaluation and titration of therapies, application of advanced monitoring  technologies and extensive interpretation of multiple databases. Critical Care Time devoted to patient care services described in this note is 35 minutes.   Overall, patient is critically ill, prognosis is guarded.  Patient with Multiorgan failure and at high risk for cardiac arrest and death.  Very poor chance of meaningful recovery  Corrin Parker, M.D.  [facility]  Medical Director [place] Director [place] Department

## 2016-01-02 NOTE — Progress Notes (Signed)
Pt Fentanyl decreased to 250 mcg/kg/min. Pt began to get restless in bed.Dr.Kasa made aware.   Order given to stop Fentanyl to try pressure support vent.  Will continue to assess.   Continuing insulin drip discussed. Orders given to continue insulin drip, and do not try to wean.  Will continue to assess.

## 2016-01-02 NOTE — Progress Notes (Signed)
Pharmacy Anti-infective Note  Stephen Camacho is a 70 y.o. male admitted on 12/05/2015 with pneumonia.  Pharmacy has been consulted for  Zosyn Plan: Zosyn: Continue Zosyn 3.375 g EI q 8 hours.   Height: '6\' 4"'$  (193 cm) Weight: 143 lb 4.8 oz (65 kg) IBW/kg (Calculated) : 86.8  Temp (24hrs), Avg:98.4 F (36.9 C), Min:97.5 F (36.4 C), Max:99.3 F (37.4 C)   Recent Labs Lab 12/28/15 0012 12/28/15 0300 12/28/15 0519 12/28/15 1130 12/28/15 1732 12/29/15 0455 12/30/15 0452 12/31/15 0458 12/31/15 1157 01/01/16 0538  WBC 16.7*  --  17.8*  --  16.6* 15.7* 14.7* 13.7*  --   --   CREATININE 1.26*  --  1.22  --   --  1.24 1.08 0.99  --  0.96  LATICACIDVEN 6.0* 2.2*  --   --   --   --   --   --   --   --   VANCOTROUGH  --   --   --  12  --   --   --   --  22*  --     Estimated Creatinine Clearance: 65.8 mL/min (by C-G formula based on Cr of 0.96).    No Known Allergies  Antimicrobials this admission: vancomycin 02/22 >> 02/23, 2/26>>03/08  ceftazidime 02/22 >> 02/24 Metronidazole 02/23 >> 02/24 Anidulafungin 02/27>>03/08  Zosyn 02/24 >>  Microbiology results: 02/23 BCx: negative x 2 02/23 UCx: negative  02/21 Sputum: Candida albicans  02/22 MRSA PCR: negative 02/23 TA Candida albicans 02/26 TA moderate growth Candida albicans  Pharmacy will continue to monitor and adjust per consult.    Stephen Camacho D 01/02/2016 7:44 AM

## 2016-01-02 NOTE — Progress Notes (Signed)
CSW spoke to RN Case Manager to discuss patient's discharge plans. Per RN Case Manager LTAC Stephen Camacho is still being pursued. CSW will continue to follow and assist.  Ernest Pine, MSW, [place] Work Department [phone]

## 2016-01-03 ENCOUNTER — Inpatient Hospital Stay: Payer: PPO

## 2016-01-03 DIAGNOSIS — Z9911 Dependence on respirator [ventilator] status: Secondary | ICD-10-CM

## 2016-01-03 DIAGNOSIS — Z930 Tracheostomy status: Secondary | ICD-10-CM

## 2016-01-03 DIAGNOSIS — I420 Dilated cardiomyopathy: Secondary | ICD-10-CM

## 2016-01-03 DIAGNOSIS — Z931 Gastrostomy status: Secondary | ICD-10-CM

## 2016-01-03 LAB — GLUCOSE, CAPILLARY
GLUCOSE-CAPILLARY: 205 mg/dL — AB (ref 65–99)
GLUCOSE-CAPILLARY: 217 mg/dL — AB (ref 65–99)
GLUCOSE-CAPILLARY: 111 mg/dL — AB (ref 65–99)
GLUCOSE-CAPILLARY: 143 mg/dL — AB (ref 65–99)
GLUCOSE-CAPILLARY: 138 mg/dL — AB (ref 65–99)
GLUCOSE-CAPILLARY: 189 mg/dL — AB (ref 65–99)
Glucose-Capillary: 129 mg/dL — ABNORMAL HIGH (ref 65–99)
Glucose-Capillary: 142 mg/dL — ABNORMAL HIGH (ref 65–99)
Glucose-Capillary: 143 mg/dL — ABNORMAL HIGH (ref 65–99)
Glucose-Capillary: 176 mg/dL — ABNORMAL HIGH (ref 65–99)
Glucose-Capillary: 180 mg/dL — ABNORMAL HIGH (ref 65–99)
Glucose-Capillary: 184 mg/dL — ABNORMAL HIGH (ref 65–99)
Glucose-Capillary: 198 mg/dL — ABNORMAL HIGH (ref 65–99)
Glucose-Capillary: 234 mg/dL — ABNORMAL HIGH (ref 65–99)
Glucose-Capillary: 243 mg/dL — ABNORMAL HIGH (ref 65–99)

## 2016-01-03 LAB — BASIC METABOLIC PANEL
ANION GAP: 5 (ref 5–15)
BUN: 40 mg/dL — ABNORMAL HIGH (ref 6–20)
CALCIUM: 8 mg/dL — AB (ref 8.9–10.3)
CO2: 26 mmol/L (ref 22–32)
CREATININE: 0.98 mg/dL (ref 0.61–1.24)
Chloride: 110 mmol/L (ref 101–111)
GFR calc Af Amer: >60 mL/min (ref >60)
GLUCOSE: 180 mg/dL — AB (ref 65–99)
Potassium: 4.1 mmol/L (ref 3.5–5.1)
Sodium: 141 mmol/L (ref 135–145)

## 2016-01-03 LAB — MAGNESIUM: Magnesium: 1.8 mg/dL (ref 1.7–2.4)

## 2016-01-03 MED ORDER — FENTANYL CITRATE (PF) 100 MCG/2ML IJ SOLN
25.0000 ug | INTRAMUSCULAR | Status: DC | PRN
  Administered 2016-01-03 (×2): 100 ug via INTRAVENOUS
  Filled 2016-01-03 (×3): qty 2

## 2016-01-03 MED ORDER — ACETAMINOPHEN 160 MG/5ML PO SOLN
650.0000 mg | ORAL | Status: DC | PRN
  Administered 2016-01-03 – 2016-01-04 (×3): 650 mg

## 2016-01-03 MED ORDER — HEPARIN SODIUM (PORCINE) 5000 UNIT/ML IJ SOLN
5000.0000 [IU] | Freq: Three times a day (TID) | INTRAMUSCULAR | Status: DC
  Administered 2016-01-03 – 2016-01-05 (×6): 5000 [IU] via SUBCUTANEOUS
  Filled 2016-01-03 (×6): qty 1

## 2016-01-03 MED ORDER — INSULIN ASPART 100 UNIT/ML ~~LOC~~ SOLN
0.0000 [IU] | SUBCUTANEOUS | Status: DC
  Administered 2016-01-03 (×2): 3 [IU] via SUBCUTANEOUS
  Administered 2016-01-03: 8 [IU] via SUBCUTANEOUS
  Administered 2016-01-03: 2 [IU] via SUBCUTANEOUS
  Administered 2016-01-03: 5 [IU] via SUBCUTANEOUS
  Administered 2016-01-04 (×3): 3 [IU] via SUBCUTANEOUS
  Administered 2016-01-04: 5 [IU] via SUBCUTANEOUS
  Administered 2016-01-05: 3 [IU] via SUBCUTANEOUS
  Administered 2016-01-05 (×3): 2 [IU] via SUBCUTANEOUS
  Administered 2016-01-06 – 2016-01-07 (×7): 3 [IU] via SUBCUTANEOUS
  Administered 2016-01-07: 5 [IU] via SUBCUTANEOUS
  Administered 2016-01-07 (×2): 3 [IU] via SUBCUTANEOUS
  Filled 2016-01-03: qty 2
  Filled 2016-01-03 (×2): qty 3
  Filled 2016-01-03: qty 2
  Filled 2016-01-03 (×2): qty 3
  Filled 2016-01-03: qty 2
  Filled 2016-01-03: qty 3
  Filled 2016-01-03: qty 5
  Filled 2016-01-03 (×4): qty 3
  Filled 2016-01-03: qty 5
  Filled 2016-01-03: qty 3
  Filled 2016-01-03: qty 8
  Filled 2016-01-03 (×5): qty 3
  Filled 2016-01-03: qty 5
  Filled 2016-01-03: qty 2
  Filled 2016-01-03: qty 3

## 2016-01-03 MED ORDER — FUROSEMIDE 10 MG/ML IJ SOLN
40.0000 mg | Freq: Once | INTRAMUSCULAR | Status: AC
  Administered 2016-01-03: 40 mg via INTRAVENOUS
  Filled 2016-01-03: qty 4

## 2016-01-03 MED ORDER — FENTANYL 50 MCG/HR TD PT72
100.0000 ug | MEDICATED_PATCH | TRANSDERMAL | Status: DC
  Administered 2016-01-03 – 2016-01-06 (×2): 100 ug via TRANSDERMAL
  Filled 2016-01-03 (×2): qty 2

## 2016-01-03 MED ORDER — TRACE MINERALS CR-CU-MN-SE-ZN 10-1000-500-60 MCG/ML IV SOLN
INTRAVENOUS | Status: AC
  Administered 2016-01-03: 18:00:00 via INTRAVENOUS
  Filled 2016-01-03: qty 960

## 2016-01-03 MED ORDER — FENTANYL CITRATE (PF) 100 MCG/2ML IJ SOLN
25.0000 ug | INTRAMUSCULAR | Status: DC | PRN
  Administered 2016-01-03 – 2016-01-04 (×5): 100 ug via INTRAVENOUS
  Filled 2016-01-03 (×5): qty 2

## 2016-01-03 MED ORDER — INSULIN GLARGINE 100 UNIT/ML ~~LOC~~ SOLN
20.0000 [IU] | Freq: Two times a day (BID) | SUBCUTANEOUS | Status: DC
  Administered 2016-01-03 – 2016-01-07 (×9): 20 [IU] via SUBCUTANEOUS
  Filled 2016-01-03 (×11): qty 0.2

## 2016-01-03 MED ORDER — VITAL HIGH PROTEIN PO LIQD: 1000.0000 mL | ORAL | Status: DC

## 2016-01-03 MED ORDER — LORAZEPAM 2 MG/ML IJ SOLN
2.0000 mg | INTRAMUSCULAR | Status: DC | PRN
  Administered 2016-01-03 – 2016-01-04 (×3): 2 mg via INTRAVENOUS
  Filled 2016-01-03 (×3): qty 1

## 2016-01-03 NOTE — Progress Notes (Signed)
[facility]  Daily Progress Note   Subjective: Minimal meaningful interaction. Patient now with trach and PEG. Dietary will start to wean TPN and start increasing tube feeds through PEG. Half of left stump sutures removed - will remove remaining in a few days to assure suture line stays intact. Dispo planning to LTAC in future.   Objective: Filed Vitals:   01/03/16 0600 01/03/16 0700 01/03/16 0800 01/03/16 0808  BP: 116/63 108/61 109/61   Pulse:      Temp: 97.7 F (36.5 C) 97.7 F (36.5 C) 97.5 F (36.4 C)   TempSrc:      Resp: '19 17 16   '$ Height:      Weight:      SpO2: 99% 99% 99% 100%    Intake/Output Summary (Last 24 hours) at 01/03/16 1327 Last data filed at 01/03/16 0800  Gross per 24 hour  Intake 1604.88 ml  Output   2300 ml  Net -695.12 ml    Physical Exam: Eyes open, does not respond to commands Trach in place. CV: RRR Pulmonary: Decreased and course Abdomen: PEG in place Vascular:  Left Stump: Every other staple removed. Incision intact   Laboratory: CBC    Component Value Date/Time   WBC 13.7* 12/31/2015 0458   WBC 4.9 10/25/2014 1331   HGB 8.9* 12/31/2015 0458   HGB 12.1* 10/25/2014 1331   HCT 28.9* 12/31/2015 0458   HCT 38.1* 10/25/2014 1331   PLT 250 12/31/2015 0458   PLT 151 10/25/2014 1331   BMET    Component Value Date/Time   NA 141 01/03/2016 0435   NA 138 10/25/2014 1331   K 4.1 01/03/2016 0435   K 3.9 10/25/2014 1331   CL 110 01/03/2016 0435   CL 109* 10/25/2014 1331   CO2 26 01/03/2016 0435   CO2 21 10/25/2014 1331   GLUCOSE 180* 01/03/2016 0435   GLUCOSE 232* 10/25/2014 1331   BUN 40* 01/03/2016 0435   BUN 17 10/25/2014 1331   CREATININE 0.98 01/03/2016 0435   CREATININE 1.11 10/25/2014 1331   CALCIUM 8.0* 01/03/2016 0435   CALCIUM 8.7 10/25/2014 1331   GFRNONAA >60 01/03/2016 0435   GFRNONAA >60 10/25/2014 1331   GFRNONAA 52* 07/08/2014 1051   GFRAA >60 01/03/2016 0435   GFRAA >60 10/25/2014 1331    GFRAA >60 07/08/2014 1051   Assessment/Planning: The patient is a 70 year old male with acute complete occlusion of theleft fem-pop bypass graft s/p thrombolysis/thrombectomy/angioplasty with residual thrombosis, s/p sepsis from LLE gangrene, acute hypoxic respiratory failure secondary to sepsis and complicated by acute NSTEMI. Now with acute respiratory failure, intubated due to pneumonia. Underwent a left AKA, s/p trach, s/p g-tube placed by IR  1) Dispo to LTAC as per family wishes   Marcelle Overlie PA-C 01/03/2016 1:27 PM

## 2016-01-03 NOTE — Progress Notes (Signed)
Pt placed back on vent at approximately 300pm for increased work of breathing.  I called and spoke with Dr. Alva Garnet, explaining that patient is tachyapenic and tachycardic.  He declined to give orders, and asked for me to call Elink MD.  Spoke with Dr. Beatrix Shipper at [facility] who asked me to give patient his PRN Duoneb. And call him back if no change.  Pt breath sounds clear without wheezing noted. Pt is agitated and PRN fentanyl is the only Medication ordered, and it does not appear to be working.  Duoneb being given by RT, Will call Dr. Beatrix Shipper to report results.

## 2016-01-03 NOTE — Progress Notes (Signed)
[facility] Progress Note Patient Name: [name] DOB: 1946-10-04 MRN: [id number]   Date of Service  01/03/2016  HPI/Events of Note  No DVT prophylaxis ordered. Progress notes state that patient is on Heparin [place].  eICU Interventions  Will order Heparin [place].      Intervention Category Intermediate Interventions: Best-practice therapies (e.g. DVT, beta blocker, etc.)  [name],[name] 01/03/2016, 9:08 PM

## 2016-01-03 NOTE — Progress Notes (Signed)
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 Now with acute respiratory failure, intubated due to pneumonia. Underwent a left AKA s/p trach and s/p g-tube placed by IR  EVENTS/DATA: 02/10 >> LLE angiogram, thrombolysis, thrombectomy, angioplasty 02/11 Intubated for suspected PNA 02/11 elevated trop I. Pk trop I 62.6 on 02/20 02/12 CT head: New nonhemorrhagic infarcts involving the right PCA territory and bilateral cerebellum, left greater than right 02/17 L AKA 02/18>extubated 02/21 TTE: LVEF 55-60% 02/21 RUE [place]: no DVT 02/21>Re-intubated 02/22 >CT head: Interval evolution of bilateral cerebellar and right PCA territory infarcts 02/27-remains intubated,discussion with family-proceed with trach and PEG 12/24/2015: Attempted EGD and PEG placement resulting in complication of bowel perforation 03/04-Asystole arrest; CPR x 12 minutes with ROSC 03/05 TTE: LVEF 35-40% 03/07 -trach placed 03/09 G tube placed  INDWELLING DEVICES:: ETT 02/11 >> 02/18, 02/21 >>  R IJ CVL 02/10 >> removed PICC line RT  MICRO DATA: MRSA PCR 02/13 >> NEG Urine 02/12 >> NEG Resp 02/11 >> MSSA C diff 02/16 >> NEG Blood 02/12 >> NEG Urine 02/19 >> NEG C diff 02/21 >> NEG Cath tip 02/19 >> NEG Resp 02/18 >> light growth candida Urine 02/21 >> NEG MRSA PCR 02/22 >> NEG Respiratory 02/26>>  ANTIMICROBIALS:  Vanc 02/11 >> 02/23 Pip-tazo 02/11 >> 02/20 Ceftaz 02/21 >> 02/24 Metronidazole 02/21 >> 02/24 Pip-tazo 02/24 >> 03/11 Vancomycin 02/25>> off  SUBJECTIVE:  RASS -3. Not F/C presently. RN reports  intermittent F/C  VITAL SIGNS: BP 164/95 mmHg  Pulse 86  Temp(Src) 100.8 F (38.2 C) (Rectal)  Resp 42  Ht '6\' 4"'$  (1.93 m)  Wt 139 lb 8.8 oz (63.3 kg)  BMI 16.99 kg/m2  SpO2 95%  HEMODYNAMICS:    VENTILATOR SETTINGS: Vent Mode:  [-] PRVC FiO2 (%):  [30 %] 30 % Set Rate:  [15 bmp] 15 bmp Vt Set:  [500 mL] 500 mL PEEP:  [5 cmH20] 5 cmH20  INTAKE / OUTPUT: I/O last 3 completed shifts: In: 4744.1 [I.V.:1159.2; NG/GT:117.3; IV Piggyback:250] Out: 2850 [Urine:2850]  PHYSICAL EXAMINATION: General: Chronically ill appearing Neuro:GCS<8T HEENT: NCAT,s/p trach Cardiovascular: RRR, S1/S2, no MRG Lungs: Bilateral airflow, no wheezes Abdomen: no BS, distended abd s/p g tube Ext: Bilateral AKA, Left stump clean and dry  LABS:  BMET  Recent Labs Lab 01/01/16 0538 01/02/16 0805 01/03/16 0435  NA 139 138 141  K 4.4 4.2 4.1  CL 108 108 110  CO2 '27 27 26  '$ BUN 34* 36* 40*  CREATININE 0.96 0.92 0.98  GLUCOSE 175* 210* 180*    Electrolytes  Recent Labs Lab 12/30/15 0452 12/31/15 0458 01/01/16 0538 01/02/16 0805 01/03/16 0435  CALCIUM 8.2* 8.0* 8.0* 7.9* 8.0*  MG 1.9 2.1 1.9  --  1.8  PHOS 3.7 3.8 3.7  --   --     CBC  Recent Labs Lab 12/29/15 0455 12/30/15 0452 12/31/15 0458  WBC 15.7* 14.7* 13.7*  HGB 9.6* 9.0* 8.9*  HCT 31.1* 29.0* 28.9*  PLT 408 326 250    Coag's  Recent Labs Lab 12/28/15 1156  APTT 38*  INR 1.35    Sepsis Markers  Recent Labs Lab  12/28/15 0012 12/28/15 0300  LATICACIDVEN 6.0* 2.2*    ABG  Recent Labs Lab 12/28/15 0416  PHART 7.41  PCO2ART 47  PO2ART 175*    Liver Enzymes  Recent Labs Lab 12/28/15 0012  AST 45*  ALT 29  ALKPHOS 96  BILITOT 0.7  ALBUMIN 1.6*    Cardiac Enzymes  Recent Labs Lab 12/28/15 0012  TROPONINI 0.27*    Glucose  Recent Labs Lab 01/03/16 0746 01/03/16 0850 01/03/16 1016 01/03/16 1104 01/03/16 1252 01/03/16 1621  GLUCAP 143* 143* 138* 142* 184* 189*   No  results found.   CXR: no new film  ASSESSMENT / PLAN:  PULMONARY A: Prolonged VDRF Trach status P:   Cont vent support - settings reviewed and/or adjusted Wean in PS mode <> ATC as tolerated Cont vent bundle Daily SBT if/when meets criteria   CARDIOVASCULAR A:  Cardiac arrest Recurrent NSTEMI PAF > NSR NSVT Cardiomyopathy (LVEF 35-40% by Echocardiogram 12/28/15) P:  -Heparin gtt stopped -MAP goal > 65 mmHg -Cont ASA -Continue amiodarone  -Hemodynamics per ICU protocol  RENAL A:   AKI, resolved P:   -Monitor BMET intermittently -Monitor I/Os -Correct electrolytes as indicated  GASTROINTESTINAL A:   G tube dependent Bowel perforation - procedure related P:   SUP: IV PPI Cont TPN until TFs @ goal Advance TFs as able  HEMATOLOGIC A:   ICU acquired anemia without acute blood loss P:  -DVT px: SQ heparin -Monitor CBC intermittently -Transfuse per usual guidelines  INFECTIOUS A:   Recurrent, severe sepsis LLE gangrene - s/p AKA HCAP P:   Monitor temp, WBC count Micro and abx as above  ENDOCRINE A:   DM 2 P:   Transition from IV insulin to Lantus and SSI  NEUROLOGIC A:   Multifocal CVAs, acute Encephalopathy H/O seizures - not on chronic AEDs PTA No chance of functional recovery P:   RASS goal: 0, -1 Transition of continuous fentanyl to Duragesic patch 03/11 PRN fentanyl PRN lorazepam  CCM time: 35 mins The above time includes time spent in consultation with patient and/or family members and reviewing care plan on multidisciplinary rounds  Merton Border, MD PCCM service Mobile [phone] Pager [phone]   01/03/2016

## 2016-01-03 NOTE — Progress Notes (Signed)
PARENTERAL NUTRITION CONSULT NOTE - FOLLOW UP   Pharmacy Consult for TPN Electrolyte and Glucose Monitoirng  Indication: Bowel Perforation  No Known Allergies  Patient Measurements: Height: '6\' 4"'$  (193 cm) Weight: 139 lb 8.8 oz (63.3 kg) IBW/kg (Calculated) : 86.8   Vital Signs: Temp: 97.7 F (36.5 C) (03/11 0700) BP: 108/61 mmHg (03/11 0700) Intake/Output from previous day: 03/10 0701 - 03/11 0700 In: 1818.6 [I.V.:441.7; NG/GT:117.3; IV Piggyback:200; TPN:1059.6] Out: 1550 [Urine:1550] Intake/Output from this shift:    Labs: No results for input(s): WBC, HGB, HCT, PLT, APTT, INR in the last 72 hours.   Recent Labs  01/01/16 0538 01/02/16 0805 01/03/16 0435  NA 139 138 141  K 4.4 4.2 4.1  CL 108 108 110  CO2 '27 27 26  '$ GLUCOSE 175* 210* 180*  BUN 34* 36* 40*  CREATININE 0.96 0.92 0.98  CALCIUM 8.0* 7.9* 8.0*  MG 1.9  --  1.8  PHOS 3.7  --   --    Estimated Creatinine Clearance: 62.8 mL/min (by C-G formula based on Cr of 0.98).    Recent Labs  01/03/16 0540 01/03/16 0643 01/03/16 0746  GLUCAP 129* 111* 143*    Medical History: Past Medical History  Diagnosis Date  . Hyperlipidemia   . Hypertension   . Diabetes mellitus ([place])   . CAD (coronary artery disease)     a. s/p 2 v CABG in 2012 (LIMA-LAD & SVG-OM); b. cath 07/2012 s/p PCI/DES to ostial LCx and OM  . PAD (peripheral artery disease) ([place])     a. s/p right SFA stent; b. s/p right toe amputation 2011; c. s/p right AKA spring 2016  . Seizures ([place])   . Gangrene of foot ([place])   . Wheezing   . Lung cancer ([place])   . Prostate cancer ([place])   . Stroke [facility])     Medications:  Scheduled:  . antiseptic oral rinse  7 mL Mouth Rinse 10 times per day  . chlorhexidine gluconate  15 mL Mouth Rinse BID  . collagenase   Topical Daily  . feeding supplement (VITAL HIGH PROTEIN)  1,000 mL Per Tube Q24H  . free water  30 mL Per Tube 6 times per day  . pantoprazole (PROTONIX) IV  40 mg Intravenous Q24H  .  piperacillin-tazobactam (ZOSYN)  IV  3.375 g Intravenous 3 times per day   Infusions:  . Marland KitchenTPN (CLINIMIX-E) Adult 83 mL/hr at 01/02/16 1854  . amiodarone 30 mg/hr (01/03/16 0400)  . fentaNYL 10 mcg/ml infusion 250 mcg/hr (01/03/16 0600)  . insulin (NOVOLIN-R) infusion 5.7 Units/hr (01/02/16 1849)  . norepinephrine (LEVOPHED) Adult infusion Stopped (12/26/15 2151)    Insulin Requirements in the past 24 hours: On continuous drip.    Current Nutrition:  Clinimix E 5/15 at 83 mL/min Continuous Tube Feeds started 3/10.   Assessment: 70 y/o M with multiple medical problems including bowel perforation.   Plan:  1. Electrolytes: WNL. No supplementation warranted @ this time. Electrolyte panel with am labs.   2. Insulin: Patient failed trial off insulin drip. Plan is to continue patient on insulin drip.   Pharmacy will continue to monitor and adjust per consult.   Reece Mcbroom A 01/03/2016,8:33 AM

## 2016-01-03 NOTE — Progress Notes (Signed)
[facility] Progress Note Patient Name: Stephen Camacho DOB: 1946/06/27 MRN: [id number]   Date of Service  01/03/2016  HPI/Events of Note  Review of CXR >> pulmonary congestion and what appears to be LLL atelectasis. Await official CXR reading.   eICU Interventions  Will order: 1. Increase PEEP to 8. 2. Lasix 40 mg IV X 1 now.      Intervention Category Major Interventions: Hypoxemia - evaluation and management  Lysle Dingwall 01/03/2016, 4:57 PM

## 2016-01-03 NOTE — Progress Notes (Signed)
Pharmacy Anti-infective Note  Stephen Camacho is a 70 y.o. male admitted on 12/05/2015 with pneumonia.  Pharmacy has been consulted for Zosyn Plan: Zosyn: Continue Zosyn 3.375 g EI q 8 hours.  Per ID note 3/10: would discontinue 3/11 and then monitor.  Height: '6\' 4"'$  (193 cm) Weight: 139 lb 8.8 oz (63.3 kg) IBW/kg (Calculated) : 86.8  Temp (24hrs), Avg:98.9 F (37.2 C), Min:97.5 F (36.4 C), Max:100 F (37.8 C)   Recent Labs Lab 12/28/15 0012 12/28/15 0300 12/28/15 0519 12/28/15 1130 12/28/15 1732 12/29/15 0455 12/30/15 0452 12/31/15 0458 12/31/15 1157 01/01/16 0538 01/02/16 0805 01/03/16 0435  WBC 16.7*  --  17.8*  --  16.6* 15.7* 14.7* 13.7*  --   --   --   --   CREATININE 1.26*  --  1.22  --   --  1.24 1.08 0.99  --  0.96 0.92 0.98  LATICACIDVEN 6.0* 2.2*  --   --   --   --   --   --   --   --   --   --   VANCOTROUGH  --   --   --  12  --   --   --   --  22*  --   --   --     Estimated Creatinine Clearance: 62.8 mL/min (by C-G formula based on Cr of 0.98).    No Known Allergies  Antimicrobials this admission: vancomycin 02/22 >> 02/23, 2/26>>03/08  ceftazidime 02/22 >> 02/24 Metronidazole 02/23 >> 02/24 Anidulafungin 02/27>>03/08  Zosyn 02/24 >>  Microbiology results: 02/23 BCx: negative x 2 02/23 UCx: negative  02/21 Sputum: Candida albicans  02/22 MRSA PCR: negative 02/23 TA Candida albicans 02/26 TA moderate growth Candida albicans  Pharmacy will continue to monitor and adjust per consult.    Stephen Camacho A 01/03/2016 8:36 AM

## 2016-01-03 NOTE — Progress Notes (Signed)
Nutrition Follow-up     INTERVENTION:  PN/EN: recommend decreasing TPN to 86m/hr with next bag and increasing vital high protein to 345mhr today with goal of increasing tube feeding to 5572mr in am and discontinuing TPN. Discussed plan with Dr. SimAlva Garnetalked with General Surgery as well, Dr. LofAzalee Courser Dr. SimAlva Garnetquest.  NUTRITION DIAGNOSIS:   Inadequate oral intake related to inability to eat as evidenced by NPO status.    GOAL:   Patient will meet greater than or equal to 90% of their needs    MONITOR:    (Energy Intake, Electrolyte and renal Profile, Anthropometrics, Digestive System, Glucose Profile, Pulmonary Profile)  REASON FOR ASSESSMENT:   Consult New TPN/TNA  ASSESSMENT:    Pt extubated this am, currently on trach collar   Current Nutrition: tolerating TPN at 85m53m and vital high protein at 15ml80m  Gastrointestinal Profile: Last BM: this am   Scheduled Medications:  . antiseptic oral rinse  7 mL Mouth Rinse 10 times per day  . chlorhexidine gluconate  15 mL Mouth Rinse BID  . collagenase   Topical Daily  . feeding supplement (VITAL HIGH PROTEIN)  1,000 mL Per Tube Q24H  . fentaNYL  100 mcg Transdermal Q72H  . free water  30 mL Per Tube 6 times per day  . insulin aspart  0-15 Units Subcutaneous 6 times per day  . insulin glargine  20 Units Subcutaneous BID  . pantoprazole (PROTONIX) IV  40 mg Intravenous Q24H  . piperacillin-tazobactam (ZOSYN)  IV  3.375 g Intravenous 3 times per day    Continuous Medications:  . .TPN Marland KitchenCLINIMIX-E) Adult 83 mL/hr at 01/02/16 1854  . amiodarone 30 mg/hr (01/03/16 0400)  . insulin (NOVOLIN-R) infusion 1.6 Units/hr (01/03/16 1017)     Electrolyte/Renal Profile and Glucose Profile:   Recent Labs Lab 12/30/15 0452 12/31/15 0458 01/01/16 0538 01/02/16 0805 01/03/16 0435  NA 146* 144 139 138 141  K 4.5 4.7 4.4 4.2 4.1  CL 114* 112* 108 108 110  CO2 '28 26 27 27 26  '$ BUN 36* 36* 34* 36* 40*   CREATININE 1.08 0.99 0.96 0.92 0.98  CALCIUM 8.2* 8.0* 8.0* 7.9* 8.0*  MG 1.9 2.1 1.9  --  1.8  PHOS 3.7 3.8 3.7  --   --   GLUCOSE 190* 156* 175* 210* 180*   Protein Profile:  Recent Labs Lab 12/28/15 0012  ALBUMIN 1.6*      Weight Trend since Admission: Filed Weights   01/01/16 0523 01/02/16 0500 01/03/16 0500  Weight: 142 lb 13.7 oz (64.8 kg) 143 lb 4.8 oz (65 kg) 139 lb 8.8 oz (63.3 kg)      Diet Order:  Diet NPO time specified .TPN (CLINIMIX-E) Adult  Skin:   (stage II pressure ulcer on sacrum and lip)  Last BM:  12/07/2015  Height:   Ht Readings from Last 1 Encounters:  12/29/15 '6\' 4"'$  (1.93 m)    Weight:   Wt Readings from Last 1 Encounters:  01/03/16 139 lb 8.8 oz (63.3 kg)    Ideal Body Weight:     BMI:  Body mass index is 16.99 kg/(m^2).  Estimated Nutritional Needs:   Kcal:  983 kcals (IF 1.1-1.4, AF 1.2) [id number]/d.   Protein:  1.2-1.5 g/kg) 76-95 g/d  Fluid:  (30-35ml/53m1890-2205ml/d72mUCATION NEEDS:   No education needs identified at this time  HIGH Care Level  Dalana Pfahler B. Evola Hollis, Zenia [email or web address]) Weekend/On-Call pager [phone]

## 2016-01-04 ENCOUNTER — Inpatient Hospital Stay: Payer: PPO

## 2016-01-04 DIAGNOSIS — I6330 Cerebral infarction due to thrombosis of unspecified cerebral artery: Secondary | ICD-10-CM

## 2016-01-04 DIAGNOSIS — J810 Acute pulmonary edema: Secondary | ICD-10-CM

## 2016-01-04 DIAGNOSIS — G9340 Encephalopathy, unspecified: Secondary | ICD-10-CM

## 2016-01-04 DIAGNOSIS — I429 Cardiomyopathy, unspecified: Secondary | ICD-10-CM

## 2016-01-04 LAB — GLUCOSE, CAPILLARY
Glucose-Capillary: 203 mg/dL — ABNORMAL HIGH (ref 65–99)
Glucose-Capillary: 203 mg/dL — ABNORMAL HIGH (ref 65–99)
Glucose-Capillary: 173 mg/dL — ABNORMAL HIGH (ref 65–99)
Glucose-Capillary: 178 mg/dL — ABNORMAL HIGH (ref 65–99)
Glucose-Capillary: 160 mg/dL — ABNORMAL HIGH (ref 65–99)
Glucose-Capillary: 105 mg/dL — ABNORMAL HIGH (ref 65–99)

## 2016-01-04 MED ORDER — CLONAZEPAM 0.1 MG/ML ORAL SUSPENSION
1.0000 mg | Freq: Two times a day (BID) | ORAL | Status: DC
  Administered 2016-01-04 (×2): 1 mg
  Filled 2016-01-04 (×10): qty 10

## 2016-01-04 MED ORDER — PANTOPRAZOLE SODIUM 40 MG PO PACK
40.0000 mg | PACK | Freq: Every day | ORAL | Status: DC
  Administered 2016-01-04 – 2016-01-07 (×4): 40 mg
  Filled 2016-01-04 (×4): qty 20

## 2016-01-04 MED ORDER — AMIODARONE HCL 200 MG PO TABS
200.0000 mg | ORAL_TABLET | Freq: Every day | ORAL | Status: DC
  Administered 2016-01-04 – 2016-01-07 (×4): 200 mg
  Filled 2016-01-04 (×4): qty 1

## 2016-01-04 MED ORDER — FUROSEMIDE 10 MG/ML IJ SOLN
40.0000 mg | Freq: Once | INTRAMUSCULAR | Status: AC
  Administered 2016-01-04: 40 mg via INTRAVENOUS
  Filled 2016-01-04: qty 4

## 2016-01-04 MED ORDER — VITAL HIGH PROTEIN PO LIQD
1000.0000 mL | ORAL | Status: DC
  Administered 2016-01-04 – 2016-01-06 (×3): 1000 mL

## 2016-01-04 MED ORDER — FENTANYL CITRATE (PF) 100 MCG/2ML IJ SOLN
25.0000 ug | INTRAMUSCULAR | Status: DC | PRN
  Administered 2016-01-06 – 2016-01-07 (×2): 50 ug via INTRAVENOUS
  Filled 2016-01-04 (×3): qty 2

## 2016-01-04 MED ORDER — FREE WATER
200.0000 mL | Freq: Three times a day (TID) | Status: DC
  Administered 2016-01-04 – 2016-01-07 (×10): 200 mL

## 2016-01-04 NOTE — Progress Notes (Signed)
Nutrition Follow-up     INTERVENTION:  PN: Recommend discontinuing TPN after current bag infuses.  Spoke with RN, Adam EN: Recommend increasing vital high protein to goal rate of 60m/hr to better meet nutritional needs via PEG   NUTRITION DIAGNOSIS:   Inadequate oral intake related to inability to eat as evidenced by NPO status.    GOAL:   Patient will meet greater than or equal to 90% of their needs    MONITOR:    (Energy Intake, Electrolyte and renal Profile, Anthropometrics, Digestive System, Glucose Profile, Pulmonary Profile)  REASON FOR ASSESSMENT:   Consult New TPN/TNA  ASSESSMENT:     Noted pt back on vent   Current Nutrition: Tolerating tube feeding at 357mhr at this time    Scheduled Medications:  . amiodarone  200 mg Per Tube Daily  . antiseptic oral rinse  7 mL Mouth Rinse 10 times per day  . chlorhexidine gluconate  15 mL Mouth Rinse BID  . clonazePAM  1 mg Per Tube BID  . collagenase   Topical Daily  . fentaNYL  100 mcg Transdermal Q72H  . free water  200 mL Per Tube 3 times per day  . furosemide  40 mg Intravenous Once  . heparin subcutaneous  5,000 Units Subcutaneous 3 times per day  . insulin aspart  0-15 Units Subcutaneous 6 times per day  . insulin glargine  20 Units Subcutaneous BID  . pantoprazole (PROTONIX) IV  40 mg Intravenous Q24H    Continuous Medications:  . .TMarland KitchenN (CLINIMIX-E) Adult 40 mL/hr at 01/03/16 1758  . feeding supplement (VITAL HIGH PROTEIN) 1,000 mL (01/03/16 1248)     Electrolyte/Renal Profile and Glucose Profile:   Recent Labs Lab 12/30/15 0452 12/31/15 0458 01/01/16 0538 01/02/16 0805 01/03/16 0435  NA 146* 144 139 138 141  K 4.5 4.7 4.4 4.2 4.1  CL 114* 112* 108 108 110  CO2 '28 26 27 27 26  '$ BUN 36* 36* 34* 36* 40*  CREATININE 1.08 0.99 0.96 0.92 0.98  CALCIUM 8.2* 8.0* 8.0* 7.9* 8.0*  MG 1.9 2.1 1.9  --  1.8  PHOS 3.7 3.8 3.7  --   --   GLUCOSE 190* 156* 175* 210* 180*       Weight Trend  since Admission: Filed Weights   01/02/16 0500 01/03/16 0500 01/04/16 0443  Weight: 143 lb 4.8 oz (65 kg) 139 lb 8.8 oz (63.3 kg) 141 lb 5 oz (64.1 kg)      Diet Order:  Diet NPO time specified .TPN (CLINIMIX-E) Adult  Skin:   (stage II pressure ulcer on sacrum and lip)  Height:   Ht Readings from Last 1 Encounters:  12/29/15 '6\' 4"'$  (1.93 m)    Weight:   Wt Readings from Last 1 Encounters:  01/04/16 141 lb 5 oz (64.1 kg)    Ideal Body Weight:     BMI:  Body mass index is 17.21 kg/(m^2).  Estimated Nutritional Needs:   Kcal:  1371 kcals/d  Protein:  (89-118 kg/d)  Fluid:  (1770-206541m)  EDUCATION NEEDS:   No education needs identified at this time  HIGH Care Level  Ulyana Pitones B. AllZenia ResidesD,[place]ry[place]ager) Weekend/On-Call pager [phone]

## 2016-01-04 NOTE — Progress Notes (Signed)
PARENTERAL NUTRITION CONSULT NOTE - FOLLOW UP   Pharmacy Consult for TPN Electrolyte and Glucose Monitoirng  Indication: Bowel Perforation  No Known Allergies  Patient Measurements: Height: '6\' 4"'$  (193 cm) Weight: 141 lb 5 oz (64.1 kg) IBW/kg (Calculated) : 86.8   Vital Signs: Temp: 99.7 F (37.6 C) (03/12 0600) BP: 146/83 mmHg (03/12 0600) Intake/Output from previous day: 03/11 0701 - 03/12 0700 In: 4739.1 [I.V.:567.8; NG/GT:768; IV Piggyback:100; TPN:3303.3] Out: 3750 [Urine:3750] Intake/Output from this shift:    Labs: No results for input(s): WBC, HGB, HCT, PLT, APTT, INR in the last 72 hours.   Recent Labs  01/02/16 0805 01/03/16 0435  NA 138 141  K 4.2 4.1  CL 108 110  CO2 27 26  GLUCOSE 210* 180*  BUN 36* 40*  CREATININE 0.92 0.98  CALCIUM 7.9* 8.0*  MG  --  1.8   Estimated Creatinine Clearance: 63.6 mL/min (by C-G formula based on Cr of 0.98).    Recent Labs  01/04/16 0412 01/04/16 0421 01/04/16 0745  GLUCAP 203* 203* 173*    Medical History: Past Medical History  Diagnosis Date  . Hyperlipidemia   . Hypertension   . Diabetes mellitus ([place])   . CAD (coronary artery disease)     Camacho. s/p 2 v CABG in 2012 (LIMA-LAD & SVG-OM); b. cath 07/2012 s/p PCI/DES to ostial LCx and OM  . PAD (peripheral artery disease) ([place])     Camacho. s/p right SFA stent; b. s/p right toe amputation 2011; c. s/p right AKA spring 2016  . Seizures ([place])   . Gangrene of foot ([place])   . Wheezing   . Lung cancer ([place])   . Prostate cancer ([place])   . Stroke [facility])     Medications:  Scheduled:  . antiseptic oral rinse  7 mL Mouth Rinse 10 times per day  . chlorhexidine gluconate  15 mL Mouth Rinse BID  . collagenase   Topical Daily  . fentaNYL  100 mcg Transdermal Q72H  . free water  30 mL Per Tube 6 times per day  . heparin subcutaneous  5,000 Units Subcutaneous 3 times per day  . insulin aspart  0-15 Units Subcutaneous 6 times per day  . insulin glargine  20 Units  Subcutaneous BID  . pantoprazole (PROTONIX) IV  40 mg Intravenous Q24H   Infusions:  . Marland KitchenTPN (CLINIMIX-E) Adult 40 mL/hr at 01/03/16 1758  . amiodarone 30 mg/hr (01/03/16 1510)  . feeding supplement (VITAL HIGH PROTEIN) 1,000 mL (01/03/16 1248)    Insulin Requirements in the past 24 hours:  TRansitioned from Insulin drip per MD 3/11. SSI q4h ordered. Amt/24 hr: 26 units Lantus 20 units Q12h  Current Nutrition:  Clinimix E 5/15 at 40 mL/min Continuous Tube Feeds started 3/10, now at 30 ml/hr.   Assessment: 70 y/o M with multiple medical problems including bowel perforation.   Plan:  1. Electrolytes: Lab orders were discontinued by MD for 3/12. Electrolytes, Mag, Phos ordered for am.  2. Insulin: Transitioned off Insulin drip per MD.  On SSI moderate scale, Lantus 20 units Q12h.  Pharmacy will continue to monitor and adjust per consult.   Stephen Camacho 01/04/2016,7:53 AM

## 2016-01-04 NOTE — Progress Notes (Signed)
PULMONARY / CRITICAL CARE MEDICINE   Name: Stephen Camacho MRN: [id number] DOB: 04/16/1946    ADMISSION DATE:  12/05/2015   CONSULTATION DATE:  12/08/15  History of Present Illness:   70 YO male with 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 Now with acute respiratory failure, intubated due to pneumonia. Underwent a left AKA s/p trach and s/p g-tube placed by IR  EVENTS/DATA: 02/10 >> LLE angiogram, thrombolysis, thrombectomy, angioplasty 02/11 Intubated for suspected PNA 02/11 elevated trop I. Pk trop I 62.6 on 02/20 02/12 CT head: New nonhemorrhagic infarcts involving the right PCA territory and bilateral cerebellum, left greater than right 02/17 L AKA 02/18>extubated 02/21 TTE: LVEF 55-60% 02/21 RUE [place]: no DVT 02/21>Re-intubated 02/22 >CT head: Interval evolution of bilateral cerebellar and right PCA territory infarcts 02/27-remains intubated,discussion with family-proceed with trach and PEG 12/24/2015: Attempted EGD and PEG placement resulting in complication of bowel perforation 03/04-Asystole arrest; CPR x 12 minutes with ROSC 03/05 TTE: LVEF 35-40% 03/07 -trach placed 03/09 G tube placed 03/11 weaning PSV > ATC. Developed increased tachypnea. CXR c/w edema/ALI. Received Lasix and lorazepam with improved gas exchange 03/12 Advancing TFs - tolerating, weaning TPN  INDWELLING DEVICES:: ETT 02/11 >> 02/18, 02/21 >> 03/07 Trach 03/07 >>  R IJ CVL 02/10 >> 02/25 RUE PICC 02/25 >>   MICRO DATA: MRSA PCR 02/13 >> NEG Urine 02/12 >> NEG Resp 02/11 >> MSSA C diff 02/16 >> NEG Blood 02/12 >> NEG Urine 02/19 >> NEG C diff 02/21 >> NEG Cath tip 02/19 >> NEG Resp 02/18 >> light growth candida Urine 02/21 >> NEG MRSA PCR 02/22 >> NEG Respiratory 02/26>> moderate  yeast  ANTIMICROBIALS:  Vanc 02/11 >> 02/23 Pip-tazo 02/11 >> 02/20 Ceftaz 02/21 >> 02/24 Metronidazole 02/21 >> 02/24 Pip-tazo 02/24 >> 03/11 Vancomycin 02/25>> off  SUBJECTIVE:  RASS -3. Not F/C presently. NAD on full vent support  VITAL SIGNS: BP 141/79 mmHg  Pulse 86  Temp(Src) 99.9 F (37.7 C) (Rectal)  Resp 29  Ht '6\' 4"'$  (1.93 m)  Wt 64.1 kg (141 lb 5 oz)  BMI 17.21 kg/m2  SpO2 100%  HEMODYNAMICS:    VENTILATOR SETTINGS: Vent Mode:  [-] PRVC FiO2 (%):  [30 %] 30 % Set Rate:  [15 bmp] 15 bmp Vt Set:  [500 mL] 500 mL PEEP:  [5 cmH20-8 cmH20] 8 cmH20 Plateau Pressure:  [18 cmH20] 18 cmH20  INTAKE / OUTPUT: I/O last 3 completed shifts: In: 5078.8 [I.V.:609.5; NG/GT:783; IV Piggyback:300] Out: 4200 [Urine:4200]  PHYSICAL EXAMINATION: General: RASS -3, not F/C Neuro: withdraws from pain HEENT: NCAT, s/p trach Cardiovascular: Reg, no M  Lungs: no wheezes Abdomen: soft, +BS Ext: Bilateral AKA, Left stump clean and dry  LABS:  BMET  Recent Labs Lab 01/01/16 0538 01/02/16 0805 01/03/16 0435  NA 139 138 141  K 4.4 4.2 4.1  CL 108 108 110  CO2 '27 27 26  '$ BUN 34* 36* 40*  CREATININE 0.96 0.92 0.98  GLUCOSE 175* 210* 180*    Electrolytes  Recent Labs Lab 12/30/15 0452 12/31/15 0458 01/01/16 0538 01/02/16 0805 01/03/16 0435  CALCIUM 8.2* 8.0* 8.0* 7.9* 8.0*  MG 1.9 2.1 1.9  --  1.8  PHOS 3.7 3.8 3.7  --   --     CBC  Recent Labs Lab 12/29/15 0455 12/30/15 0452 12/31/15 0458  WBC 15.7* 14.7*  13.7*  HGB 9.6* 9.0* 8.9*  HCT 31.1* 29.0* 28.9*  PLT 408 326 250    Coag's  Recent Labs Lab 12/28/15 1156  APTT 38*  INR 1.35    Sepsis Markers No results for input(s): LATICACIDVEN, PROCALCITON, O2SATVEN in the last 168 hours.  ABG No results for input(s): PHART, PCO2ART, PO2ART in the last 168 hours.  Liver Enzymes No results for input(s): AST, ALT, ALKPHOS, BILITOT, ALBUMIN in the last 168 hours.  Cardiac Enzymes No results  for input(s): TROPONINI, PROBNP in the last 168 hours.  Glucose  Recent Labs Lab 01/03/16 1621 01/03/16 1940 01/03/16 2326 01/04/16 0412 01/04/16 0421 01/04/16 0745  GLUCAP 189* 243* 234* 203* 203* 173*    CXR: edema, ALI pattern  ASSESSMENT / PLAN:  PULMONARY A: Prolonged VDRF Trach status Pulm edema pattern on CXR P:   Cont vent support - settings reviewed and/or adjusted Wean in PS mode <> ATC as tolerated Cont vent bundle Daily SBT if/when meets criteria Lasix X 1 03/12  CARDIOVASCULAR A:  Cardiac arrest Recurrent NSTEMI PAF > NSR NSVT Cardiomyopathy (LVEF 35-40% by Echocardiogram 12/28/15) P:  MAP goal > 65 mmHg Cont ASA Change amiodarone from IV to enteral 03/12   RENAL A:   AKI, resolved P:   Monitor BMET intermittently Monitor I/Os Correct electrolytes as indicated  GASTROINTESTINAL A:   G tube dependent S/P bowel perforation - procedure related P:   SUP: enteral PPI Cont to advance TFs and wean TPN off  HEMATOLOGIC A:   ICU acquired anemia without acute blood loss P:  DVT px: SQ heparin Monitor CBC intermittently Transfuse per usual guidelines  INFECTIOUS A:   Recurrent, severe sepsis LLE gangrene - s/p AKA HCAP Recurrent fever 03/12 without overt source P:   Monitor temp, WBC count Micro and abx as above Check PCT AM 03/13  ENDOCRINE A:   DM 2, adequately controlled P:   Cont Lantus and SSI  NEUROLOGIC A:   Multifocal CVAs, acute Encephalopathy H/O seizures - not on chronic AEDs  No chance of functional recovery P:   RASS goal: 0, -1 Cont Duragesic patch initiated 03/11 Cont PRN fentanyl Cont PRN lorazepam  CCM time: 35 mins The above time includes time spent in consultation with patient and/or family members and reviewing care plan on multidisciplinary rounds  Merton Border, MD PCCM service Mobile [phone] Pager [phone]   01/04/2016

## 2016-01-04 NOTE — Plan of Care (Signed)
Problem: Phase I Progression Outcomes Goal: Progressing towards optiumm acitivities Outcome: Not Progressing Unable to follow commands or increase activity

## 2016-01-05 ENCOUNTER — Inpatient Hospital Stay: Payer: PPO

## 2016-01-05 LAB — GLUCOSE, CAPILLARY
GLUCOSE-CAPILLARY: 116 mg/dL — AB (ref 65–99)
GLUCOSE-CAPILLARY: 119 mg/dL — AB (ref 65–99)
GLUCOSE-CAPILLARY: 161 mg/dL — AB (ref 65–99)
Glucose-Capillary: 113 mg/dL — ABNORMAL HIGH (ref 65–99)
Glucose-Capillary: 126 mg/dL — ABNORMAL HIGH (ref 65–99)
Glucose-Capillary: 138 mg/dL — ABNORMAL HIGH (ref 65–99)
Glucose-Capillary: 139 mg/dL — ABNORMAL HIGH (ref 65–99)

## 2016-01-05 LAB — C DIFFICILE QUICK SCREEN W PCR REFLEX
C DIFFICILE (CDIFF) TOXIN: NEGATIVE
C DIFFICLE (CDIFF) ANTIGEN: NEGATIVE
C Diff interpretation: NEGATIVE

## 2016-01-05 LAB — CBC
HEMATOCRIT: 30.5 % — AB (ref 40.0–52.0)
Hemoglobin: 9.7 g/dL — ABNORMAL LOW (ref 13.0–18.0)
MCH: 27.9 pg (ref 26.0–34.0)
MCHC: 31.7 g/dL — ABNORMAL LOW (ref 32.0–36.0)
MCV: 87.8 fL (ref 80.0–100.0)
Platelets: 186 10*3/uL (ref 150–440)
RBC: 3.48 MIL/uL — AB (ref 4.40–5.90)
RDW: 18.9 % — ABNORMAL HIGH (ref 11.5–14.5)
WBC: 11.4 10*3/uL — AB (ref 3.8–10.6)

## 2016-01-05 LAB — BASIC METABOLIC PANEL
Anion gap: 6 (ref 5–15)
BUN: 40 mg/dL — ABNORMAL HIGH (ref 6–20)
CHLORIDE: 108 mmol/L (ref 101–111)
CO2: 28 mmol/L (ref 22–32)
Calcium: 7.7 mg/dL — ABNORMAL LOW (ref 8.9–10.3)
Creatinine, Ser: 1.03 mg/dL (ref 0.61–1.24)
GFR calc non Af Amer: >60 mL/min (ref >60)
Glucose, Bld: 146 mg/dL — ABNORMAL HIGH (ref 65–99)
POTASSIUM: 3.3 mmol/L — AB (ref 3.5–5.1)
SODIUM: 142 mmol/L (ref 135–145)

## 2016-01-05 LAB — PROCALCITONIN: Procalcitonin: 0.76 ng/mL

## 2016-01-05 LAB — PHOSPHORUS: Phosphorus: 3.2 mg/dL (ref 2.5–4.6)

## 2016-01-05 LAB — MAGNESIUM: Magnesium: 1.8 mg/dL (ref 1.7–2.4)

## 2016-01-05 MED ORDER — MAGNESIUM SULFATE 2 GM/50ML IV SOLN
2.0000 g | Freq: Once | INTRAVENOUS | Status: AC
  Administered 2016-01-05: 2 g via INTRAVENOUS
  Filled 2016-01-05: qty 50

## 2016-01-05 MED ORDER — ENOXAPARIN SODIUM 40 MG/0.4ML ~~LOC~~ SOLN
40.0000 mg | SUBCUTANEOUS | Status: DC
  Administered 2016-01-05 – 2016-01-06 (×2): 40 mg via SUBCUTANEOUS
  Filled 2016-01-05 (×2): qty 0.4

## 2016-01-05 MED ORDER — SODIUM CHLORIDE 0.9% FLUSH: 10.0000 mL | INTRAVENOUS | Status: DC | PRN

## 2016-01-05 MED ORDER — POTASSIUM CHLORIDE 10 MEQ/100ML IV SOLN
10.0000 meq | INTRAVENOUS | Status: AC
  Administered 2016-01-05 (×4): 10 meq via INTRAVENOUS
  Filled 2016-01-05 (×4): qty 100

## 2016-01-05 MED ORDER — SODIUM CHLORIDE 0.9% FLUSH
10.0000 mL | Freq: Two times a day (BID) | INTRAVENOUS | Status: DC
  Administered 2016-01-05 – 2016-01-07 (×5): 10 mL

## 2016-01-05 NOTE — Progress Notes (Signed)
Chaplain rounded in the unit and provided a compassionate presence to the patient an said a silent prayer. Stephen Camacho [phone]

## 2016-01-05 NOTE — Clinical Social Work Note (Signed)
Per RN CM documentation, the initiation of LTAC referral made today. Shela Leff MSW,LCSW [phone]

## 2016-01-05 NOTE — Progress Notes (Signed)
[facility]  Daily Progress Note   Subjective: Remaining left stump staples removed this AM. Patient responding to the discomfort of the staple removal however not following commands. All staples without complication. Incision intact, clean and dry.   Objective: Filed Vitals:   01/05/16 0500 01/05/16 0600 01/05/16 0710 01/05/16 0816  BP: 138/77 157/94 163/91   Pulse:      Temp: 100.8 F (38.2 C) 100 F (37.8 C) 99.7 F (37.6 C)   TempSrc:      Resp: 30 26 32   Height:      Weight: 56.1 kg (123 lb 10.9 oz)     SpO2: 100% 100% 100% 100%    Intake/Output Summary (Last 24 hours) at 01/05/16 0838 Last data filed at 01/05/16 0540  Gross per 24 hour  Intake      0 ml  Output   5725 ml  Net  -5725 ml   Physical Exam: Eyes open, does not respond to commands, responds to pain Trach in place. CV: RRR Pulmonary: Decreased and course Abdomen: PEG in place Vascular: Left Stump: Staples removed. Incision clean, dry and intact   Laboratory: CBC    Component Value Date/Time   WBC 11.4* 01/05/2016 0514   WBC 4.9 10/25/2014 1331   HGB 9.7* 01/05/2016 0514   HGB 12.1* 10/25/2014 1331   HCT 30.5* 01/05/2016 0514   HCT 38.1* 10/25/2014 1331   PLT 186 01/05/2016 0514   PLT 151 10/25/2014 1331   BMET    Component Value Date/Time   NA 142 01/05/2016 0514   NA 138 10/25/2014 1331   K 3.3* 01/05/2016 0514   K 3.9 10/25/2014 1331   CL 108 01/05/2016 0514   CL 109* 10/25/2014 1331   CO2 28 01/05/2016 0514   CO2 21 10/25/2014 1331   GLUCOSE 146* 01/05/2016 0514   GLUCOSE 232* 10/25/2014 1331   BUN 40* 01/05/2016 0514   BUN 17 10/25/2014 1331   CREATININE 1.03 01/05/2016 0514   CREATININE 1.11 10/25/2014 1331   CALCIUM 7.7* 01/05/2016 0514   CALCIUM 8.7 10/25/2014 1331   GFRNONAA >60 01/05/2016 0514   GFRNONAA >60 10/25/2014 1331   GFRNONAA 52* 07/08/2014 1051   GFRAA >60 01/05/2016 0514   GFRAA >60 10/25/2014 1331   GFRAA >60 07/08/2014  1051   Assessment/Planning: The patient is a 70 year old male with acute complete occlusion of theleft fem-pop bypass graft s/p thrombolysis/thrombectomy/angioplasty with residual thrombosis, s/p sepsis from LLE gangrene, acute hypoxic respiratory failure secondary to sepsis and complicated by acute NSTEMI. Now with acute respiratory failure, intubated due to pneumonia. Underwent a left AKA, s/p trach, s/p g-tube placed by IR  1) Dispo to LTAC as per family wishes   Marcelle Overlie PA-C 01/05/2016 8:38 AM

## 2016-01-05 NOTE — Progress Notes (Signed)
PARENTERAL NUTRITION CONSULT NOTE - FOLLOW UP   Pharmacy Consult for TPN Electrolyte and Glucose Monitoirng  Indication: Bowel Perforation  No Known Allergies  Patient Measurements: Height: '6\' 4"'$  (193 cm) Weight: 123 lb 10.9 oz (56.1 kg) IBW/kg (Calculated) : 86.8   Vital Signs: Temp: 99.7 F (37.6 C) (03/13 0710) BP: 163/91 mmHg (03/13 0710) Intake/Output from previous day: 03/12 0701 - 03/13 0700 In: -  Out: 5725 [Urine:5725] Intake/Output from this shift:    Labs:  Recent Labs  01/05/16 0514  WBC 11.4*  HGB 9.7*  HCT 30.5*  PLT 186     Recent Labs  01/03/16 0435 01/05/16 0514  NA 141 142  K 4.1 3.3*  CL 110 108  CO2 26 28  GLUCOSE 180* 146*  BUN 40* 40*  CREATININE 0.98 1.03  CALCIUM 8.0* 7.7*  MG 1.8 1.8  PHOS  --  3.2   Estimated Creatinine Clearance: 53 mL/min (by C-G formula based on Cr of 1.03).    Recent Labs  01/05/16 0022 01/05/16 0351 01/05/16 0809  GLUCAP 116* 119* 161*    Medical History: Past Medical History  Diagnosis Date  . Hyperlipidemia   . Hypertension   . Diabetes mellitus ([place])   . CAD (coronary artery disease)     a. s/p 2 v CABG in 2012 (LIMA-LAD & SVG-OM); b. cath 07/2012 s/p PCI/DES to ostial LCx and OM  . PAD (peripheral artery disease) ([place])     a. s/p right SFA stent; b. s/p right toe amputation 2011; c. s/p right AKA spring 2016  . Seizures ([place])   . Gangrene of foot ([place])   . Wheezing   . Lung cancer ([place])   . Prostate cancer ([place])   . Stroke [facility])     Medications:  Scheduled:  . amiodarone  200 mg Per Tube Daily  . antiseptic oral rinse  7 mL Mouth Rinse 10 times per day  . chlorhexidine gluconate  15 mL Mouth Rinse BID  . clonazePAM  1 mg Per Tube BID  . collagenase   Topical Daily  . fentaNYL  100 mcg Transdermal Q72H  . free water  200 mL Per Tube 3 times per day  . heparin subcutaneous  5,000 Units Subcutaneous 3 times per day  . insulin aspart  0-15 Units Subcutaneous 6 times per day  .  insulin glargine  20 Units Subcutaneous BID  . magnesium sulfate 1 - 4 g bolus IVPB  2 g Intravenous Once  . pantoprazole sodium  40 mg Per Tube Q1200  . potassium chloride  10 mEq Intravenous Q1 Hr x 4   Infusions:  . feeding supplement (VITAL HIGH PROTEIN) 1,000 mL (01/04/16 1125)    Insulin Requirements in the past 24 hours:  SSI q4h ordered. Amt/24 hr: 12 units Lantus 20 units Q12h  Current Nutrition:  Continuous Tube Feeds started 3/10, now at 55 ml/hr.   Assessment: 70 y/o M with multiple medical problems including bowel perforation.   Plan:  1. Electrolytes: Will replace potassium with KCl 10 meq x 4 iv and magnesium with magnesium sulfate 2 g iv once to maintain magnesium of > or = 2. Will f/u AM labs.   2. Insulin: Transitioned off Insulin drip per MD.  On SSI moderate scale, Lantus 20 units Q12h.  Pharmacy will continue to monitor and adjust per consult.   [name] 01/05/2016,8:36 AM

## 2016-01-05 NOTE — Progress Notes (Addendum)
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 Now with acute respiratory failure, intubated due to pneumonia. Underwent a left AKA s/p trach and s/p g-tube placed by IR  EVENTS/DATA: 02/10 >> LLE angiogram, thrombolysis, thrombectomy, angioplasty 02/11 Intubated for suspected PNA 02/11 elevated trop I. Pk trop I 62.6 on 02/20 02/12 CT head: New nonhemorrhagic infarcts involving the right PCA territory and bilateral cerebellum, left greater than right 02/17 L AKA 02/18>extubated 02/21 TTE: LVEF 55-60% 02/21 RUE [place]: no DVT 02/21>Re-intubated 02/22 >CT head: Interval evolution of bilateral cerebellar and right PCA territory infarcts 02/27-remains intubated,discussion with family-proceed with trach and PEG 12/24/2015: Attempted EGD and PEG placement resulting in complication of bowel perforation 03/04-Asystole arrest; CPR x 12 minutes with ROSC 03/05 TTE: LVEF 35-40% 03/07 -trach placed 03/09 G tube placed  INDWELLING DEVICES:: ETT 02/11 >> 02/18, 02/21 >>  R IJ CVL 02/10 >> removed PICC line RT  MICRO DATA: MRSA PCR 02/13 >> NEG Urine 02/12 >> NEG Resp 02/11 >> MSSA C diff 02/16 >> NEG Blood 02/12 >> NEG Urine 02/19 >> NEG C diff 02/21 >> NEG Cath tip 02/19 >> NEG Resp 02/18 >> light growth candida Urine 02/21 >> NEG MRSA PCR 02/22 >> NEG Respiratory 02/26>>  ANTIMICROBIALS:  Vanc 02/11 >> 02/23 Pip-tazo 02/11 >> 02/20 Ceftaz 02/21 >> 02/24 Metronidazole 02/21 >> 02/24 Pip-tazo 02/24 >> 03/11 Vancomycin 02/25>> off  SUBJECTIVE:  RASS -3. Not F/C presently. RN reports reduced mental status today.    VITAL SIGNS: BP 140/78 mmHg  Pulse 86  Temp(Src) 99.7 F (37.6 C) (Rectal)  Resp 35  Ht '6\' 4"'$  (1.93 m)  Wt 123 lb 10.9 oz (56.1 kg)  BMI 15.06 kg/m2  SpO2 100%  HEMODYNAMICS:    VENTILATOR SETTINGS: Vent Mode:  [-] PRVC FiO2 (%):  [30 %] 30 % Set Rate:  [15 bmp] 15 bmp Vt Set:  [500 mL] 500 mL PEEP:  [5 cmH20] 5 cmH20 Plateau Pressure:  [15 cmH20-16 cmH20] 15 cmH20  INTAKE / OUTPUT: I/O last 3 completed shifts: In: 4794.1 [I.V.:567.8; NG/GT:823; IV Piggyback:100] Out: 7725 [Urine:7725]  PHYSICAL EXAMINATION: General: Chronically ill appearing Neuro:GCS<8T. Minimally responsive.  HEENT: NCAT,s/p trach Cardiovascular: RRR, S1/S2, no MRG Lungs: Bilateral airflow, no wheezes Abdomen: no BS, distended abd s/p g tube Ext: Bilateral AKA, Left stump clean and dry  LABS:  BMET  Recent Labs Lab 01/02/16 0805 01/03/16 0435 01/05/16 0514  NA 138 141 142  K 4.2 4.1 3.3*  CL 108 110 108  CO2 '27 26 28  '$ BUN 36* 40* 40*  CREATININE 0.92 0.98 1.03  GLUCOSE 210* 180* 146*    Electrolytes  Recent Labs Lab 12/31/15 0458 01/01/16 0538 01/02/16 0805 01/03/16 0435 01/05/16 0514  CALCIUM 8.0* 8.0* 7.9* 8.0* 7.7*  MG 2.1 1.9  --  1.8 1.8  PHOS 3.8 3.7  --   --  3.2    CBC  Recent Labs Lab 12/30/15 0452 12/31/15 0458 01/05/16 0514  WBC 14.7* 13.7* 11.4*  HGB 9.0* 8.9* 9.7*  HCT 29.0* 28.9* 30.5*  PLT 326 250 186    Coag's No results for input(s): APTT, INR in the last 168 hours.  Sepsis Markers  Recent Labs Lab 01/05/16 0514  PROCALCITON 0.76    ABG No results for input(s): PHART, PCO2ART, PO2ART in the last 168 hours.  Liver Enzymes No results for input(s): AST, ALT, ALKPHOS, BILITOT, ALBUMIN in the last 168 hours.  Cardiac Enzymes No results for input(s): TROPONINI, PROBNP in the last 168 hours.  Glucose  Recent Labs Lab 01/04/16 1956 01/05/16 0018 01/05/16 0022 01/05/16 0351 01/05/16 0809 01/05/16 1128  GLUCAP 105* 113* 116*  119* 161* 126*   Dg Chest Port 1 View  01/05/2016  CLINICAL DATA:  Respiratory failure. EXAM: PORTABLE CHEST 1 VIEW COMPARISON:  January 04, 2016. FINDINGS: Stable cardiomegaly. Tracheostomy is in good position. Left-sided PICC line is unchanged in position. Stable mild left pleural effusion is noted. Stable left perihilar and basilar opacities are noted, concerning for edema or pneumonia. Slightly increased right lower lobe opacity is noted concerning for edema or pneumonia or atelectasis. No pneumothorax is noted. Sternotomy wires are noted. IMPRESSION: Stable support apparatus. Stable mild left pleural effusion. Stable left perihilar and basilar opacities are noted concerning for edema or pneumonia. Slightly increased right lower lobe opacity is noted concerning for worsening edema, pneumonia or atelectasis. Electronically Signed   By: Marijo Conception, M.D.   On: 01/05/2016 08:00     CXR: no new film  ASSESSMENT / PLAN:  PULMONARY A: Prolonged VDRF Trach status P:   Cont vent support - settings reviewed and/or adjusted Wean in PS mode <> ATC as tolerated Cont vent bundle Continue to wean off vent as tolerated.  CXR from 3/13 reviewed, no significant change from previous film, continue bilateral infiltrates.   CARDIOVASCULAR A:  Cardiac arrest Recurrent NSTEMI PAF > NSR NSVT Cardiomyopathy (LVEF 35-40% by Echocardiogram 12/28/15) P:  -Heparin gtt stopped -MAP goal > 65 mmHg -Cont ASA -Continue amiodarone  -Hemodynamics per ICU protocol  RENAL A:   AKI, resolved P:   -Monitor BMET intermittently -Monitor I/Os -Correct electrolytes as indicated  GASTROINTESTINAL A:   Now with diarrhea.  G tube dependent Bowel perforation - procedure related P:   Continue rectal tube, send stool for C.dif testing.  SUP: IV PPI Cont TPN until TFs @ goal Advance TFs as able  HEMATOLOGIC A:   ICU acquired anemia without acute blood loss P:  -DVT px: SQ heparin -Monitor CBC  intermittently -Transfuse per usual guidelines  INFECTIOUS A:   Recurrent, severe sepsis LLE gangrene - s/p AKA HCAP P:   Monitor temp, WBC count Micro and abx as above  ENDOCRINE A:   DM 2 P:   Transition from IV insulin to Lantus and SSI  NEUROLOGIC A:   Multifocal CVAs, acute Encephalopathy H/O seizures - not on chronic AEDs PTA No chance of functional recovery P:   RASS goal: 0, -1 Transition of continuous fentanyl to Duragesic patch 03/11 PRN fentanyl PRN lorazepam  Deep Ashby Dawes, M.D.    01/05/2016  Critical Care Attestation.  I have personally obtained a history, examined the patient, evaluated laboratory and imaging results, formulated the assessment and plan and placed orders. The Patient requires high complexity decision making for assessment and support, frequent evaluation and titration of therapies, application of advanced monitoring technologies and extensive interpretation of multiple databases. The patient has critical illness that could lead imminently to failure of 1 or more organ systems and requires the highest level of physician preparedness to intervene.  Critical Care Time devoted to patient care services described in this note is 35 minutes and is  exclusive of time spent in procedures.

## 2016-01-05 NOTE — Care Management (Signed)
Authorization initiated for [employer] [facility]ac.  Will have neuro reconsult

## 2016-01-05 NOTE — Progress Notes (Signed)
Nutrition Follow-up    INTERVENTION:   EN: recommend continuing current TF regimen, continue to assess   NUTRITION DIAGNOSIS:   Inadequate oral intake related to inability to eat as evidenced by NPO status. Being addressed via TF  GOAL:   Patient will meet greater than or equal to 90% of their needs  MONITOR:    (Energy Intake, Electrolyte and renal Profile, Anthropometrics, Digestive System, Glucose Profile, Pulmonary Profile)  REASON FOR ASSESSMENT:   Consult New TPN/TNA  ASSESSMENT:   Pt remains on vent  Diet Order:  Diet NPO time specified   EN: tolerating Vital High Protein at rate of 55 ml/hr, off TPN  Digestive System: +liquid stool, C.diff negative, no signs of TF intolerance  Skin:   (stage II pressure ulcer on sacrum and lip)   Recent Labs Lab 12/31/15 0458 01/01/16 0538 01/02/16 0805 01/03/16 0435 01/05/16 0514  NA 144 139 138 141 142  K 4.7 4.4 4.2 4.1 3.3*  CL 112* 108 108 110 108  CO2 '26 27 27 26 28  '$ BUN 36* 34* 36* 40* 40*  CREATININE 0.99 0.96 0.92 0.98 1.03  CALCIUM 8.0* 8.0* 7.9* 8.0* 7.7*  MG 2.1 1.9  --  1.8 1.8  PHOS 3.8 3.7  --   --  3.2  GLUCOSE 156* 175* 210* 180* 146*    Glucose Profile:  Recent Labs  01/05/16 0351 01/05/16 0809 01/05/16 1128  GLUCAP 119* 161* 126*   Meds: ss novolog, lantus  Height:   Ht Readings from Last 1 Encounters:  12/29/15 '6\' 4"'$  (1.93 m)    Weight:   Wt Readings from Last 1 Encounters:  01/05/16 123 lb 10.9 oz (56.1 kg)    Filed Weights   01/03/16 0500 01/04/16 0443 01/05/16 0500  Weight: 139 lb 8.8 oz (63.3 kg) 141 lb 5 oz (64.1 kg) 123 lb 10.9 oz (56.1 kg)    BMI:  Body mass index is 15.06 kg/(m^2).  Estimated Nutritional Needs:   Kcal:  3532 kcals/d  Protein:  (89-118 kg/d)  Fluid:  (1770-209m/d)  EDUCATION NEEDS:   No education needs identified at this time  [place] [address], LDN ([phone]  [phone]/On-Call Pager

## 2016-01-05 NOTE — Progress Notes (Signed)
Patient is lethargic but responds to painful stimuli and at times, voice.  More responsive now than earlier in the shift.  Remains in SR, normotensive.  K and Mg replaced this shift with recheck in the AM.  Patient weaned to Trach collar 30% at 1100 and remained on trach collar until the end of shift, when he was put back on PRVC mode on the vent to rest the night.  UOP adequate through his foley.  Continuous liquid stools that leak around flexiseal, which was repositioned and irrigated throughout shift.  Stool tested for C diff today and was negetive.  TF infusing through g tube.  Wound care provided to sacral pressure ulcer and patient turned q 2 hours/repositioned.  Son and daughter updated this eve on status and POC for patient.  LTAC referral placed this eve.

## 2016-01-06 LAB — BASIC METABOLIC PANEL
ANION GAP: 4 — AB (ref 5–15)
BUN: 35 mg/dL — ABNORMAL HIGH (ref 6–20)
CALCIUM: 7.6 mg/dL — AB (ref 8.9–10.3)
CO2: 22 mmol/L (ref 22–32)
Chloride: 111 mmol/L (ref 101–111)
Creatinine, Ser: 0.92 mg/dL (ref 0.61–1.24)
Glucose, Bld: 157 mg/dL — ABNORMAL HIGH (ref 65–99)
POTASSIUM: 4 mmol/L (ref 3.5–5.1)
SODIUM: 137 mmol/L (ref 135–145)

## 2016-01-06 LAB — CBC
HCT: 31.4 % — ABNORMAL LOW (ref 40.0–52.0)
Hemoglobin: 10 g/dL — ABNORMAL LOW (ref 13.0–18.0)
MCH: 28 pg (ref 26.0–34.0)
MCHC: 31.8 g/dL — ABNORMAL LOW (ref 32.0–36.0)
MCV: 88 fL (ref 80.0–100.0)
PLATELETS: 185 10*3/uL (ref 150–440)
RBC: 3.56 MIL/uL — AB (ref 4.40–5.90)
RDW: 19.1 % — AB (ref 11.5–14.5)
WBC: 10.8 10*3/uL — AB (ref 3.8–10.6)

## 2016-01-06 LAB — GLUCOSE, CAPILLARY
GLUCOSE-CAPILLARY: 157 mg/dL — AB (ref 65–99)
GLUCOSE-CAPILLARY: 160 mg/dL — AB (ref 65–99)
GLUCOSE-CAPILLARY: 160 mg/dL — AB (ref 65–99)
GLUCOSE-CAPILLARY: 182 mg/dL — AB (ref 65–99)
Glucose-Capillary: 157 mg/dL — ABNORMAL HIGH (ref 65–99)
Glucose-Capillary: 168 mg/dL — ABNORMAL HIGH (ref 65–99)

## 2016-01-06 LAB — PHOSPHORUS: Phosphorus: 2.7 mg/dL (ref 2.5–4.6)

## 2016-01-06 LAB — MAGNESIUM: MAGNESIUM: 2.1 mg/dL (ref 1.7–2.4)

## 2016-01-06 MED ORDER — FUROSEMIDE 10 MG/ML IJ SOLN
40.0000 mg | Freq: Once | INTRAMUSCULAR | Status: AC
  Administered 2016-01-06: 40 mg via INTRAVENOUS
  Filled 2016-01-06: qty 4

## 2016-01-06 MED ORDER — CLONAZEPAM 0.1 MG/ML ORAL SUSPENSION
0.5000 mg | Freq: Two times a day (BID) | ORAL | Status: DC
  Administered 2016-01-06 – 2016-01-07 (×2): 0.5 mg
  Filled 2016-01-06 (×4): qty 5

## 2016-01-06 NOTE — Progress Notes (Addendum)
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 Now with acute respiratory failure, intubated due to pneumonia. Underwent a left AKA s/p trach and s/p g-tube placed by IR  EVENTS/DATA: 02/10 >> LLE angiogram, thrombolysis, thrombectomy, angioplasty 02/11 Intubated for suspected PNA 02/11 elevated trop I. Pk trop I 62.6 on 02/20 02/12 CT head: New nonhemorrhagic infarcts involving the right PCA territory and bilateral cerebellum, left greater than right 02/17 L AKA 02/18>extubated 02/21 TTE: LVEF 55-60% 02/21 RUE [place]: no DVT 02/21>Re-intubated 02/22 >CT head: Interval evolution of bilateral cerebellar and right PCA territory infarcts 02/27-remains intubated,discussion with family-proceed with trach and PEG 12/24/2015: Attempted EGD and PEG placement resulting in complication of bowel perforation 03/04-Asystole arrest; CPR x 12 minutes with ROSC 03/05 TTE: LVEF 35-40% 03/07 -trach placed 03/09 G tube placed 03/11 PSV > ATC. Developed respiratory distress. CXR c/w edema. Lasix and lorazepam with improved gas exchange 03/12 Advancing TFs - tolerating, weaning TPN 03/14 Toelrated ATC X several hours  INDWELLING DEVICES:: ETT 02/11 >> 02/18, 02/21 >> 03/07 Trach 03/07 >>  R IJ CVL 02/10 >> 02/25 RUE PICC 02/25 >>   MICRO DATA: MRSA PCR 02/13 >> NEG Urine 02/12 >> NEG Resp 02/11 >> MSSA C diff 02/16 >> NEG Blood 02/12 >> NEG Urine 02/19 >> NEG C diff 02/21 >> NEG Cath tip 02/19 >> NEG Resp 02/18 >> light growth candida Urine 02/21 >> NEG MRSA PCR 02/22 >> NEG Resp 02/26 >>  moderate yeast C diff PCR 03/13 >> NEG  ANTIMICROBIALS:  Vanc 02/11 >> 02/23 Pip-tazo 02/11 >> 02/20 Ceftaz 02/21 >> 02/24 Metronidazole 02/21 >> 02/24 Pip-tazo 02/24 >> 03/11 Vancomycin 02/25>> off  SUBJECTIVE:  RASS -3. Not F/C presently. NAD on ATC  VITAL SIGNS: BP 137/70 mmHg  Pulse 86  Temp(Src) 100.8 F (38.2 C) (Rectal)  Resp 30  Ht '6\' 4"'$  (1.93 m)  Wt 56.6 kg (124 lb 12.5 oz)  BMI 15.20 kg/m2  SpO2 100%  HEMODYNAMICS:    VENTILATOR SETTINGS: Vent Mode:  [-]  FiO2 (%):  [30 %] 30 %  INTAKE / OUTPUT: I/O last 3 completed shifts: In: 2085 [Other:60; IH/KV:4259; IV Piggyback:450] Out: 3000 [Urine:3000]  PHYSICAL EXAMINATION: General: RASS -3, not F/C Neuro: withdraws from pain HEENT: NCAT, s/p trach Cardiovascular: Reg, no M  Lungs: no wheezes Abdomen: soft, +BS Ext: Bilateral AKA, Left stump clean and dry  LABS:  BMET  Recent Labs Lab 01/03/16 0435 01/05/16 0514 01/06/16 0526  NA 141 142 137  K 4.1 3.3* 4.0  CL 110 108 111  CO2 '26 28 22  '$ BUN 40* 40* 35*  CREATININE 0.98 1.03 0.92  GLUCOSE 180* 146* 157*    Electrolytes  Recent Labs Lab 01/01/16 0538  01/03/16 0435 01/05/16 0514 01/06/16 0526  CALCIUM 8.0*  < > 8.0* 7.7* 7.6*  MG 1.9  --  1.8 1.8 2.1  PHOS 3.7  --   --  3.2 2.7  < > = values in this interval not displayed.  CBC  Recent Labs Lab 12/31/15 0458 01/05/16 0514 01/06/16 0526  WBC 13.7* 11.4* 10.8*  HGB 8.9* 9.7* 10.0*  HCT 28.9* 30.5*  31.4*  PLT 250 186 185    Coag's No results for input(s): APTT, INR in the last 168 hours.  Sepsis Markers  Recent Labs Lab 01/05/16 0514  PROCALCITON 0.76    ABG No results for input(s): PHART, PCO2ART, PO2ART in the last 168 hours.  Liver Enzymes No results for input(s): AST, ALT, ALKPHOS, BILITOT, ALBUMIN in the last 168 hours.  Cardiac Enzymes No results for input(s): TROPONINI, PROBNP in the last 168 hours.  Glucose  Recent Labs Lab 01/05/16 1128  01/05/16 1616 01/05/16 1927 01/06/16 0019 01/06/16 0428 01/06/16 0716  GLUCAP 126* 138* 139* 157* 157* 160*    CXR: NNF  ASSESSMENT / PLAN:  PULMONARY A: Prolonged VDRF Trach status Pulm edema pattern on CXR P:   Cont vent support - settings reviewed and/or adjusted Wean in PS mode <> ATC as tolerated Cont vent bundle Daily SBT if/when meets criteria Lasix X 1 03/14  CARDIOVASCULAR A:  Cardiac arrest Recurrent NSTEMI PAF > NSR NSVT Cardiomyopathy (LVEF 35-40% by Echocardiogram 12/28/15) P:  MAP goal > 65 mmHg Cont ASA Change amiodarone from IV to enteral 03/12   RENAL A:   AKI, resolved P:   Monitor BMET intermittently Monitor I/Os Correct electrolytes as indicated  GASTROINTESTINAL A:   G tube dependent S/P bowel perforation - procedure related P:   SUP: enteral PPI Cont to advance TFs and wean TPN off  HEMATOLOGIC A:   ICU acquired anemia without acute blood loss P:  DVT px: SQ heparin Monitor CBC intermittently Transfuse per usual guidelines  INFECTIOUS A:   Recurrent, severe sepsis LLE gangrene - s/p AKA HCAP Recurrent fever 03/12 without overt source P:   Monitor temp, WBC count Micro and abx as above Check PCT AM 03/13  ENDOCRINE A:   DM 2, adequately controlled P:   Cont Lantus and SSI  NEUROLOGIC A:   Multifocal CVAs, acute Encephalopathy H/O seizures - not on chronic AEDs  No chance of functional recovery P:   RASS goal: 0, -1 Cont Duragesic patch initiated 03/11 Decrease clonazepam 03/14 Cont PRN fentanyl Cont PRN lorazepam  DISCHARGE PLANNING: Seeking LTACH  CCM time: 30 mins The above time includes time spent in consultation with patient and/or family members and reviewing care plan on multidisciplinary rounds  Merton Border, MD PCCM service Mobile [phone] Pager [phone]   01/06/2016

## 2016-01-06 NOTE — Progress Notes (Signed)
01/06/2016 10:59 AM  Stephen Camacho [id number]  Post-Op Day 7    Temp:  [98.8 F (37.1 C)-100.8 F (38.2 C)] 100.8 F (38.2 C) (03/14 0900) Resp:  [19-38] 30 (03/14 1000) BP: (129-176)/(66-106) 137/70 mmHg (03/14 1000) SpO2:  [96 %-100 %] 100 % (03/14 1000) FiO2 (%):  [30 %] 30 % (03/14 0730) Weight:  [56.6 kg (124 lb 12.5 oz)] 56.6 kg (124 lb 12.5 oz) (03/14 0500),     Intake/Output Summary (Last 24 hours) at 01/06/16 1059 Last data filed at 01/06/16 5784  Gross per 24 hour  Intake   1640 ml  Output   2250 ml  Net   -610 ml    Results for orders placed or performed during the hospital encounter of 12/05/15 (from the past 24 hour(s))  Glucose, capillary     Status: Abnormal   Collection Time: 01/05/16 11:28 AM  Result Value Ref Range   Glucose-Capillary 126 (H) 65 - 99 mg/dL  Glucose, capillary     Status: Abnormal   Collection Time: 01/05/16  4:16 PM  Result Value Ref Range   Glucose-Capillary 138 (H) 65 - 99 mg/dL  Glucose, capillary     Status: Abnormal   Collection Time: 01/05/16  7:27 PM  Result Value Ref Range   Glucose-Capillary 139 (H) 65 - 99 mg/dL   Comment 1 Notify RN   Glucose, capillary     Status: Abnormal   Collection Time: 01/06/16 12:19 AM  Result Value Ref Range   Glucose-Capillary 157 (H) 65 - 99 mg/dL  Glucose, capillary     Status: Abnormal   Collection Time: 01/06/16  4:28 AM  Result Value Ref Range   Glucose-Capillary 157 (H) 65 - 99 mg/dL  Basic metabolic panel     Status: Abnormal   Collection Time: 01/06/16  5:26 AM  Result Value Ref Range   Sodium 137 135 - 145 mmol/L   Potassium 4.0 3.5 - 5.1 mmol/L   Chloride 111 101 - 111 mmol/L   CO2 22 22 - 32 mmol/L   Glucose, Bld 157 (H) 65 - 99 mg/dL   BUN 35 (H) 6 - 20 mg/dL   Creatinine, Ser 0.92 0.61 - 1.24 mg/dL   Calcium 7.6 (L) 8.9 - 10.3 mg/dL   GFR calc non Af Amer >60 >60 mL/min   GFR calc Af Amer >60 >60 mL/min   Anion gap 4 (L) 5 - 15  Magnesium     Status: None   Collection Time: 01/06/16  5:26 AM  Result Value Ref Range   Magnesium 2.1 1.7 - 2.4 mg/dL  Phosphorus     Status: None   Collection Time: 01/06/16  5:26 AM  Result Value Ref Range   Phosphorus 2.7 2.5 - 4.6 mg/dL  CBC     Status: Abnormal   Collection Time: 01/06/16  5:26 AM  Result Value Ref Range   WBC 10.8 (H) 3.8 - 10.6 K/uL   RBC 3.56 (L) 4.40 - 5.90 MIL/uL   Hemoglobin 10.0 (L) 13.0 - 18.0 g/dL   HCT 31.4 (L) 40.0 - 52.0 %   MCV 88.0 80.0 - 100.0 fL   MCH 28.0 26.0 - 34.0 pg   MCHC 31.8 (L) 32.0 - 36.0 g/dL   RDW 19.1 (H) 11.5 - 14.5 %   Platelets 185 150 - 440 K/uL  Glucose, capillary     Status: Abnormal   Collection Time: 01/06/16  7:16 AM  Result Value Ref Range   Glucose-Capillary 160 (H)  65 - 99 mg/dL    SUBJECTIVE:  Trache functioning without difficulty  OBJECTIVE:  Neck clean and dry  IMPRESSION:  S/p trache  PLAN:  Nursing to remove the 4 silk sutures around the trache.  ENT will sign off for now.    Stephen Camacho T 01/06/2016, 10:59 AM

## 2016-01-06 NOTE — Care Management (Signed)
Still awaiting insurance auth for ltac

## 2016-01-06 NOTE — Progress Notes (Signed)
Nutrition Follow-up    INTERVENTION:    EN: continue current TF regimen at present, if continues to tolerate trach collar, will reassess nutritional needs and adjust TF regimen as appropriate  NUTRITION DIAGNOSIS:   Inadequate oral intake related to inability to eat as evidenced by NPO status. Being addressed via TF  GOAL:   Patient will meet greater than or equal to 90% of their needs  MONITOR:    (Energy Intake, Electrolyte and renal Profile, Anthropometrics, Digestive System, Glucose Profile, Pulmonary Profile)  REASON FOR ASSESSMENT:   Consult New TPN/TNA  ASSESSMENT:   Pt tolerating trach collar trials, pt lethargic, withdraws to pain  EN: tolerating Vital High Protein at rate of 55 ml/hr  Skin:   (stage II pressure ulcer on sacrum and lip)   Digestive System: +liquid stool, no signs of TF intolerance   Recent Labs Lab 01/01/16 0538  01/03/16 0435 01/05/16 0514 01/06/16 0526  NA 139  < > 141 142 137  K 4.4  < > 4.1 3.3* 4.0  CL 108  < > 110 108 111  CO2 27  < > '26 28 22  '$ BUN 34*  < > 40* 40* 35*  CREATININE 0.96  < > 0.98 1.03 0.92  CALCIUM 8.0*  < > 8.0* 7.7* 7.6*  MG 1.9  --  1.8 1.8 2.1  PHOS 3.7  --   --  3.2 2.7  GLUCOSE 175*  < > 180* 146* 157*  < > = values in this interval not displayed.  Glucose Profile:  Recent Labs  01/06/16 0428 01/06/16 0716 01/06/16 1145  GLUCAP 157* 160* 160*   Meds: lasix, ss novolog, lantus, potassium chloride  Height:   Ht Readings from Last 1 Encounters:  12/29/15 '6\' 4"'$  (1.93 m)    Weight:   Wt Readings from Last 1 Encounters:  01/06/16 124 lb 12.5 oz (56.6 kg)    Filed Weights   01/04/16 0443 01/05/16 0500 01/06/16 0500  Weight: 141 lb 5 oz (64.1 kg) 123 lb 10.9 oz (56.1 kg) 124 lb 12.5 oz (56.6 kg)    BMI:  Body mass index is 15.2 kg/(m^2).  Estimated Nutritional Needs:   Kcal:  0233 kcals/d  Protein:  (89-118 kg/d)  Fluid:  (1770-2057m/d)  EDUCATION NEEDS:   No education needs  identified at this time  HCopan RD, LDN ([phone]  [phone]/On-Call Pager

## 2016-01-06 NOTE — Progress Notes (Signed)
PARENTERAL NUTRITION CONSULT NOTE - FOLLOW UP   Pharmacy Consult for TPN Electrolyte and Glucose Monitoirng  Indication: Bowel Perforation  No Known Allergies  Patient Measurements: Height: '6\' 4"'$  (193 cm) Weight: 124 lb 12.5 oz (56.6 kg) IBW/kg (Calculated) : 86.8   Vital Signs: Temp: 100.8 F (38.2 C) (03/14 0900) BP: 129/73 mmHg (03/14 0900) Intake/Output from previous day: 03/13 0701 - 03/14 0700 In: 2030 [NG/GT:1520; IV Piggyback:450] Out: 1900 [Urine:1900] Intake/Output from this shift: Total I/O In: -  Out: 350 [Urine:350]  Labs:  Recent Labs  01/05/16 0514 01/06/16 0526  WBC 11.4* 10.8*  HGB 9.7* 10.0*  HCT 30.5* 31.4*  PLT 186 185     Recent Labs  01/05/16 0514 01/06/16 0526  NA 142 137  K 3.3* 4.0  CL 108 111  CO2 28 22  GLUCOSE 146* 157*  BUN 40* 35*  CREATININE 1.03 0.92  CALCIUM 7.7* 7.6*  MG 1.8 2.1  PHOS 3.2 2.7   Estimated Creatinine Clearance: 59.8 mL/min (by C-G formula based on Cr of 0.92).    Recent Labs  01/06/16 0019 01/06/16 0428 01/06/16 0716  GLUCAP 157* 157* 160*    Medical History: Past Medical History  Diagnosis Date  . Hyperlipidemia   . Hypertension   . Diabetes mellitus ([place])   . CAD (coronary artery disease)     a. s/p 2 v CABG in 2012 (LIMA-LAD & SVG-OM); b. cath 07/2012 s/p PCI/DES to ostial LCx and OM  . PAD (peripheral artery disease) ([place])     a. s/p right SFA stent; b. s/p right toe amputation 2011; c. s/p right AKA spring 2016  . Seizures ([place])   . Gangrene of foot ([place])   . Wheezing   . Lung cancer ([place])   . Prostate cancer ([place])   . Stroke [facility])     Medications:  Scheduled:  . amiodarone  200 mg Per Tube Daily  . antiseptic oral rinse  7 mL Mouth Rinse 10 times per day  . chlorhexidine gluconate  15 mL Mouth Rinse BID  . clonazePAM  1 mg Per Tube BID  . collagenase   Topical Daily  . enoxaparin (LOVENOX) injection  40 mg Subcutaneous Q24H  . fentaNYL  100 mcg Transdermal Q72H  . free  water  200 mL Per Tube 3 times per day  . insulin aspart  0-15 Units Subcutaneous 6 times per day  . insulin glargine  20 Units Subcutaneous BID  . pantoprazole sodium  40 mg Per Tube Q1200  . sodium chloride flush  10-40 mL Intracatheter Q12H   Infusions:  . feeding supplement (VITAL HIGH PROTEIN) 1,000 mL (01/05/16 1434)    Insulin Requirements in the past 24 hours:  SSI q4h ordered. Amt/24 hr: 15 units Lantus 20 units Q12h  Current Nutrition:  Continuous Tube Feeds started 3/10, now at 55 ml/hr.   Assessment: 70 y/o M with multiple medical problems including bowel perforation.   Plan:  1. Electrolytes: Electrolytes are WNL. No need for supplementation today. Will f/u AM labs.   2. Insulin: Transitioned off Insulin drip per MD.  On SSI moderate scale, Lantus 20 units Q12h.  Pharmacy will continue to monitor and adjust per consult.   [name] 01/06/2016,9:58 AM

## 2016-01-06 NOTE — Progress Notes (Addendum)
Asked Dr. Leonidas Romberg if he wanted to order a CT of the head due to patient inability to open eyes or respond to voice.  Patient does withdrawal from pain.  Patient has been off IV sedation since yesterday.  Dr. Leonidas Romberg stated he did not want to order a CT. Patient tolerating trach collar- NSR on monitor and vitals stable.

## 2016-01-07 ENCOUNTER — Ambulatory Visit (HOSPITAL_COMMUNITY)
Admission: AD | Admit: 2016-01-07 | Discharge: 2016-01-07 | Disposition: A | Discharge status: Home / Self Care | Payer: PPO | Source: Other Acute Inpatient Hospital | Attending: Pulmonary Disease | Admitting: Pulmonary Disease

## 2016-01-07 DIAGNOSIS — Z9911 Dependence on respirator [ventilator] status: Secondary | ICD-10-CM | POA: Insufficient documentation

## 2016-01-07 LAB — GLUCOSE, CAPILLARY
GLUCOSE-CAPILLARY: 219 mg/dL — AB (ref 65–99)
GLUCOSE-CAPILLARY: 177 mg/dL — AB (ref 65–99)
GLUCOSE-CAPILLARY: 159 mg/dL — AB (ref 65–99)
Glucose-Capillary: 167 mg/dL — ABNORMAL HIGH (ref 65–99)

## 2016-01-07 LAB — BASIC METABOLIC PANEL
Anion gap: 4 — ABNORMAL LOW (ref 5–15)
BUN: 39 mg/dL — AB (ref 6–20)
CALCIUM: 7.7 mg/dL — AB (ref 8.9–10.3)
CO2: 24 mmol/L (ref 22–32)
Chloride: 110 mmol/L (ref 101–111)
Creatinine, Ser: 0.79 mg/dL (ref 0.61–1.24)
GFR calc Af Amer: >60 mL/min (ref >60)
GLUCOSE: 249 mg/dL — AB (ref 65–99)
POTASSIUM: 3 mmol/L — AB (ref 3.5–5.1)
Sodium: 138 mmol/L (ref 135–145)

## 2016-01-07 MED ORDER — IPRATROPIUM-ALBUTEROL 0.5-2.5 (3) MG/3ML IN SOLN: 3.0000 mL | RESPIRATORY_TRACT | Status: AC | PRN

## 2016-01-07 MED ORDER — FENTANYL CITRATE (PF) 100 MCG/2ML IJ SOLN: 25.0000 ug | INTRAMUSCULAR | Status: AC | PRN

## 2016-01-07 MED ORDER — CHLORHEXIDINE GLUCONATE 0.12% ORAL RINSE (MEDLINE KIT): 15.0000 mL | Freq: Two times a day (BID) | OROMUCOSAL | Status: AC

## 2016-01-07 MED ORDER — VITAL HIGH PROTEIN PO LIQD: 1000.0000 mL | ORAL | Status: AC

## 2016-01-07 MED ORDER — PANTOPRAZOLE SODIUM 40 MG PO PACK: 40.0000 mg | PACK | Freq: Every day | ORAL | Status: AC

## 2016-01-07 MED ORDER — COLLAGENASE 250 UNIT/GM EX OINT: TOPICAL_OINTMENT | Freq: Every day | CUTANEOUS | Status: AC

## 2016-01-07 MED ORDER — INSULIN GLARGINE 100 UNIT/ML ~~LOC~~ SOLN: 20.0000 [IU] | Freq: Two times a day (BID) | SUBCUTANEOUS | Status: AC

## 2016-01-07 MED ORDER — AMIODARONE HCL 200 MG PO TABS: 200.0000 mg | ORAL_TABLET | Freq: Every day | ORAL | Status: AC

## 2016-01-07 MED ORDER — CLONAZEPAM 0.1 MG/ML ORAL SUSPENSION: 0.5000 mg | Freq: Two times a day (BID) | ORAL | Status: AC

## 2016-01-07 MED ORDER — POTASSIUM CHLORIDE 20 MEQ/15ML (10%) PO SOLN
40.0000 meq | Freq: Once | ORAL | Status: AC
  Administered 2016-01-07: 40 meq
  Filled 2016-01-07: qty 30

## 2016-01-07 MED ORDER — ENOXAPARIN SODIUM 40 MG/0.4ML ~~LOC~~ SOLN: 40.0000 mg | SUBCUTANEOUS | Status: AC

## 2016-01-07 MED ORDER — FENTANYL 100 MCG/HR TD PT72: 100.0000 ug | MEDICATED_PATCH | TRANSDERMAL | Status: AC

## 2016-01-07 MED ORDER — ACETAMINOPHEN 160 MG/5ML PO SOLN: 650.0000 mg | ORAL | Status: AC | PRN

## 2016-01-07 MED ORDER — INSULIN ASPART 100 UNIT/ML ~~LOC~~ SOLN: 0.0000 [IU] | SUBCUTANEOUS | Status: AC

## 2016-01-07 MED ORDER — SODIUM CHLORIDE 0.9% FLUSH: 10.0000 mL | Freq: Two times a day (BID) | INTRAVENOUS | Status: AC

## 2016-01-07 NOTE — Care Management (Signed)
Received approval from [employer] for LTAC.  Notice was faxed to [facility] 3/15 with a begin date of 3/15.  S[facility] Bake [facility] found that [facility] and [facility] had [facility] listed as the LTAC.  This has been corrected and patient can transfer to the facility today..  Have paged Dr Delana Meyer for discharge completion.  Spoke with daughter [name] and she is in agreement with transfer.  Informed her voicemail left for [facility].  [name] says she will text [name].  She asks that someone call her with estimated time of departure. Informed primary nurse.

## 2016-01-07 NOTE — Discharge Summary (Signed)
[facility]    Discharge Summary    Patient ID:  Stephen Camacho MRN: [id number] DOB/AGE: 1946/03/30 70 y.o.  Admit date: 12/05/2015 Discharge date: 01/07/2016 Date of Surgery: 12/05/2015 - 12/30/2015 Surgeon: Juliann Mule): Beverly Gust, MD  Admission Diagnosis: Ischemic leg [I99.8] Occlusion of arterial bypass graft, initial encounter [facility]) [T82.399A]  Discharge Diagnoses:  Ischemic leg [I99.8] Occlusion of arterial bypass graft, initial encounter ([place]) [T82.399A]  Secondary Diagnoses: Past Medical History  Diagnosis Date  . Hyperlipidemia   . Hypertension   . Diabetes mellitus ([place])   . CAD (coronary artery disease)     a. s/p 2 v CABG in 2012 (LIMA-LAD & SVG-OM); b. cath 07/2012 s/p PCI/DES to ostial LCx and OM  . PAD (peripheral artery disease) ([place])     a. s/p right SFA stent; b. s/p right toe amputation 2011; c. s/p right AKA spring 2016  . Seizures ([place])   . Gangrene of foot ([place])   . Wheezing   . Lung cancer ([place])   . Prostate cancer ([place])   . Stroke [facility])     Procedure(s): TRACHEOSTOMY  Discharged Condition: Stable but severely ill  HPI:  The patient presented to [facility] on 12/05/2015 with an acutely ischemic left leg. At that time he was taken to special procedures where angiography was performed and from the lysis was initiated. He was taken to the unit postprocedure. Medical consultation was obtained. The following day 12/06/2015 the patient was returned to the [facility] where further intervention was performed he appeared to have restoration of flow to his foot and was started on an Aggrastat drip and return to the intensive care unit. Later that day he had a severe change in his overall condition he became lethargic he appeared to have had a stroke but also ruled in for a rather large myocardial infarction. Critical care cardiology work consult. The following day he was noted to have acute renal failure likely secondary to  hypotension from the above event and nephrology was consult. He did receive several rounds of dialysis but ultimately his kidneys recover.  His mental status improves slightly although he remained lethargic. And there was increasing concerns that his left foot which had again become ischemic secondary to the hypotension and cardiogenic shock was now threatening his life. Consequently on February 17 he was taken for left above-knee amputation. He remained hemodynamically stable throughout the procedure was returned to the intensive care unit. He improved somewhat particularly with his mental status although still lethargic and groggy he did appear to be able to answer questions with a simple phrases. Therefore he was transferred from the intensive care unit to the floor on February 19. On February 20 a rapid response was called as his condition had severely deteriorated he was reintubated on February 21. Subsequently, he is demonstrated slow improvement. He has not been able to extubate. On March 1 a G-tube was attempted this was complicated by free air following the procedure and the procedure was not completed. Given this condition he was treated aggressively with antibiotics and over the next several days maintained a stable hemodynamic situation. Therefore on March 7 he underwent successful tracheostomy and on March 9 a G-tube was successfully inserted.  His G-tube is currently working tracheostomy is done well and over the past 6 days he has been unchanged from his mental status as well as his respiratory status he is tolerating enteral feeds. At this point this appears to be his baseline and he has  been accepted by a long-term care facility.  Hospital Course:  Stephen Camacho is a 70 y.o. male is S/P Left Procedure(s): TRACHEOSTOMY Extubated: POD # Currently ventilated via tracheostomy Physical exam: Left above-knee amputation staples of been removed stump is warm trach site is healing well Post-op  wounds clean, dry, intact or healing well Pt. Ambulating, voiding and taking PO diet without difficulty. Pt pain controlled with PO pain meds. Labs as below Complications: Myocardial infarction cardiogenic shock respiratory failure  Consults:  Treatment Team:  Flora Lipps, MD Hollice Espy, MD Adrian Prows, MD  Significant Diagnostic Studies: CBC Lab Results  Component Value Date   WBC 10.8* 01/06/2016   HGB 10.0* 01/06/2016   HCT 31.4* 01/06/2016   MCV 88.0 01/06/2016   PLT 185 01/06/2016    BMET    Component Value Date/Time   NA 138 01/07/2016 0510   NA 138 10/25/2014 1331   K 3.0* 01/07/2016 0510   K 3.9 10/25/2014 1331   CL 110 01/07/2016 0510   CL 109* 10/25/2014 1331   CO2 24 01/07/2016 0510   CO2 21 10/25/2014 1331   GLUCOSE 249* 01/07/2016 0510   GLUCOSE 232* 10/25/2014 1331   BUN 39* 01/07/2016 0510   BUN 17 10/25/2014 1331   CREATININE 0.79 01/07/2016 0510   CREATININE 1.11 10/25/2014 1331   CALCIUM 7.7* 01/07/2016 0510   CALCIUM 8.7 10/25/2014 1331   GFRNONAA >60 01/07/2016 0510   GFRNONAA >60 10/25/2014 1331   GFRNONAA 52* 07/08/2014 1051   GFRAA >60 01/07/2016 0510   GFRAA >60 10/25/2014 1331   GFRAA >60 07/08/2014 1051   COAG Lab Results  Component Value Date   INR 1.35 12/28/2015   INR 1.99 12/05/2015   INR 1.2 07/08/2014     Disposition:  Discharge to :Long-term care facility    Medication List    ASK your doctor about these medications        acetaminophen 650 MG CR tablet  Commonly known as:  TYLENOL  Take 650 mg by mouth every 8 (eight) hours as needed for pain.     aspirin 81 MG chewable tablet  Chew 81 mg by mouth daily.     atorvastatin 40 MG tablet  Commonly known as:  LIPITOR  Take 40 mg by mouth daily at 6 PM.     cilostazol 50 MG tablet  Commonly known as:  PLETAL  Take 50 mg by mouth 2 (two) times daily.     insulin glargine 100 UNIT/ML injection  Commonly known as:  LANTUS  Inject 10 Units into the  skin at bedtime.     isosorbide mononitrate 60 MG 24 hr tablet  Commonly known as:  IMDUR  Take 60 mg by mouth daily.     lisinopril 10 MG tablet  Commonly known as:  PRINIVIL,ZESTRIL  Take 10 mg by mouth daily.     meclizine 25 MG tablet  Commonly known as:  ANTIVERT  Take 1 tablet (25 mg total) by mouth 3 (three) times daily as needed for dizziness.     metFORMIN 500 MG tablet  Commonly known as:  GLUCOPHAGE  Take by mouth 2 (two) times daily with a meal.     metoprolol 50 MG tablet  Commonly known as:  LOPRESSOR  Take 50 mg by mouth 2 (two) times daily.     nitroGLYCERIN 0.4 MG SL tablet  Commonly known as:  NITROSTAT  Place 0.4 mg under the tongue every 5 (five) minutes as needed for chest  pain.     ondansetron 4 MG tablet  Commonly known as:  ZOFRAN  Take 1 tablet (4 mg total) by mouth every 8 (eight) hours as needed for nausea or vomiting.     rivaroxaban 20 MG Tabs tablet  Commonly known as:  XARELTO  Take 20 mg by mouth daily with supper.     traMADol 50 MG tablet  Commonly known as:  ULTRAM  Take 25-50 mg by mouth every 6 (six) hours as needed for severe pain.       Verbal and written Discharge instructions given to the patient. Wound care per Discharge AVS   Signed: [name], MD  01/07/2016, 1:42 PM

## 2016-01-07 NOTE — Progress Notes (Signed)
Patient just left with [facility].  Son at bedside.  Son took patient's belongings.

## 2016-01-07 NOTE — Progress Notes (Signed)
Gave report to [facility], RN and also called report to [facility].  [facility] to be here in about 10 min.

## 2016-01-07 NOTE — Clinical Social Work Note (Signed)
Patient to transfer to LTAC today according to RN CM documentation.  Shela Leff MSW,LCSW [phone]

## 2016-01-07 NOTE — Consult Note (Signed)
   [facility] CM Inpatient Consult   01/07/2016  Kevontae Burgoon [birth year] [phone]  Patient screened for potential [place] Management services. Patient is eligible for [place]. Epic reveals patient's discharge plan is LTACH. [facility] Care Management services not appropriate at this time. If patient's post hospital needs change please place a [facility] Care Management consult. For questions please contact:   Zyanya Glaza RN, [facility] Liaison  [phone]) Business Mobile [phone]) Toll free office

## 2016-01-07 NOTE — Progress Notes (Signed)
PULMONARY / CRITICAL CARE MEDICINE   Name: Stephen Camacho MRN: [id number] DOB: [birth year]    ADMISSION DATE:  12/05/2015   CONSULTATION DATE:  12/08/15  History of Present Illness:   70 YO male with 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 Now with acute respiratory failure, intubated due to pneumonia. Underwent a left AKA s/p trach and s/p g-tube placed by IR  EVENTS/DATA: 02/10 >> LLE angiogram, thrombolysis, thrombectomy, angioplasty 02/11 Intubated for suspected PNA 02/11 elevated trop I. Pk trop I 62.6 on 02/20 02/12 CT head: New nonhemorrhagic infarcts involving the right PCA territory and bilateral cerebellum, left greater than right 02/17 L AKA 02/18>extubated 02/21 TTE: LVEF 55-60% 02/21 RUE [place]: no DVT 02/21>Re-intubated 02/22 >CT head: Interval evolution of bilateral cerebellar and right PCA territory infarcts 02/27-remains intubated,discussion with family-proceed with trach and PEG 12/24/2015: Attempted EGD and PEG placement resulting in complication of bowel perforation 03/04-Asystole arrest; CPR x 12 minutes with ROSC 03/05 TTE: LVEF 35-40% 03/07 -trach placed 03/09 G tube placed 03/11 PSV > ATC. Developed respiratory distress. CXR c/w edema. Lasix and lorazepam with improved gas exchange 03/12 Advancing TFs - tolerating, weaning TPN 03/15 Tolerated ATC X 2 days  INDWELLING DEVICES:: ETT 02/11 >> 02/18, 02/21 >> 03/07 Trach 03/07 >>  R IJ CVL 02/10 >> 02/25 RUE PICC 02/25 >>   MICRO DATA: MRSA PCR 02/13 >> NEG Urine 02/12 >> NEG Resp 02/11 >> MSSA C diff 02/16 >> NEG Blood 02/12 >> NEG Urine 02/19 >> NEG C diff 02/21 >> NEG Cath tip 02/19 >> NEG Resp 02/18 >> light growth candida Urine 02/21 >> NEG MRSA PCR 02/22 >> NEG Resp 02/26 >> moderate  yeast C diff PCR 03/13 >> NEG  ANTIMICROBIALS:  Vanc 02/11 >> 02/23 Pip-tazo 02/11 >> 02/20 Ceftaz 02/21 >> 02/24 Metronidazole 02/21 >> 02/24 Pip-tazo 02/24 >> 03/11 Vancomycin 02/25>> off  SUBJECTIVE:  RASS -2. Not F/C presently. NAD on ATC  VITAL SIGNS: BP 129/66 mmHg  Pulse 86  Temp(Src) 97.5 F (36.4 C) (Rectal)  Resp 21  Ht '6\' 4"'$  (1.93 m)  Wt 54.8 kg (120 lb 13 oz)  BMI 14.71 kg/m2  SpO2 100%  HEMODYNAMICS:    VENTILATOR SETTINGS: Vent Mode:  [-]  FiO2 (%):  [30 %] 30 %  INTAKE / OUTPUT: I/O last 3 completed shifts: In: 2580 [NG/GT:2580] Out: 4650 [Urine:4650]  PHYSICAL EXAMINATION: General: RASS -2, not F/C Neuro: withdraws from pain HEENT: NCAT, s/p trach Cardiovascular: Reg, no M  Lungs: no wheezes Abdomen: soft, +BS Ext: Bilateral AKA, Left stump clean and dry  LABS:  BMET  Recent Labs Lab 01/05/16 0514 01/06/16 0526 01/07/16 0510  NA 142 137 138  K 3.3* 4.0 3.0*  CL 108 111 110  CO2 '28 22 24  '$ BUN 40* 35* 39*  CREATININE 1.03 0.92 0.79  GLUCOSE 146* 157* 249*    Electrolytes  Recent Labs Lab 01/01/16 0538  01/03/16 0435 01/05/16 0514 01/06/16 0526 01/07/16 0510  CALCIUM 8.0*  < > 8.0* 7.7* 7.6* 7.7*  MG 1.9  --  1.8 1.8 2.1  --   PHOS 3.7  --   --  3.2 2.7  --   < > = values in this interval not displayed.  CBC  Recent Labs Lab 01/05/16 0514 01/06/16 0526  WBC 11.4* 10.8*  HGB 9.7* 10.0*  HCT  30.5* 31.4*  PLT 186 185    Coag's No results for input(s): APTT, INR in the last 168 hours.  Sepsis Markers  Recent Labs Lab 01/05/16 0514  PROCALCITON 0.76    ABG No results for input(s): PHART, PCO2ART, PO2ART in the last 168 hours.  Liver Enzymes No results for input(s): AST, ALT, ALKPHOS, BILITOT, ALBUMIN in the last 168 hours.  Cardiac Enzymes No results for input(s): TROPONINI, PROBNP in the last 168 hours.  Glucose  Recent Labs Lab 01/06/16 1658 01/06/16 1953 01/07/16 01/07/16 0427 01/07/16 0737  01/07/16 1143  GLUCAP 168* 182* 167* 219* 177* 159*    CXR: NNF  ASSESSMENT / PLAN:  PULMONARY A: Prolonged VDRF Trach status Pulm edema pattern on CXR P:   Cont vent support - settings reviewed and/or adjusted Wean in PS mode <> ATC as tolerated Cont vent bundle Daily SBT if/when meets criteria Recheck CXR AM 03/16  CARDIOVASCULAR A:  S/P cardiac arrest Recurrent NSTEMI PAF > NSR NSVT Cardiomyopathy (LVEF 35-40% by Echocardiogram 12/28/15) P:  MAP goal > 65 mmHg Cont ASA Cont enteral amiodarone   RENAL A:   AKI, resolved Hypokalemia, recurrent P:   Monitor BMET intermittently Monitor I/Os Correct electrolytes as indicated  GASTROINTESTINAL A:   G tube dependent S/P bowel perforation P:   SUP: enteral PPI Cont TFs  HEMATOLOGIC A:   ICU acquired anemia without acute blood loss P:  DVT px: LMWH Monitor CBC intermittently Transfuse per usual guidelines  INFECTIOUS A:   Recurrent, severe sepsis LLE gangrene - s/p AKA HCAP Persistent low grade fevers without overt infectious source P:   Monitor temp, WBC count Micro and abx as above Recheck PCT AM 03/15  ENDOCRINE A:   DM 2, adequately controlled P:   Cont Lantus and SSI  NEUROLOGIC A:   Multifocal CVAs, acute Encephalopathy H/O seizures - not on chronic AEDs  No chance of functional recovery P:   RASS goal: 0, -1 Cont Duragesic patch initiated 03/11 Cont clonazepam decreased 03/14 Cont PRN fentanyl Cont PRN lorazepam  DISCHARGE PLANNING: Seeking [facility]    Merton Border, MD PCCM service Mobile [phone] Pager [phone]   01/07/2016

## 2016-02-23 DEATH — deceased

## 2016-03-09 ENCOUNTER — Encounter: Payer: Self-pay | Admitting: *Deleted

## 2017-09-12 IMAGING — CT CT ANGIO EXTREM LOW*L*
2 of 11 series · 12 of 46 positions shown, 18 images · IV contrast (APPLIED)
Comparison: Left lower extremity venous Doppler ultrasound
performed 12/02/2015

CLINICAL DATA: Acute onset of left leg pain. Cold left foot.
Personal history of left-sided fem-pop bypass graft. Initial
encounter.

EXAM:
CT ANGIOGRAPHY OF THE LEFT LOWER EXTREMITY
TECHNIQUE: Multidetector CT imaging of the left lower extremity was performed
using the standard protocol during bolus administration of
intravenous contrast. Multiplanar CT image reconstructions and MIPs
were obtained to evaluate the vascular anatomy.
CONTRAST:  125mL OMNIPAQUE IOHEXOL 350 MG/ML SOLN

[Series 4: arterial abd pel · axial · arterial · 0.52mm/px · z∈[+605,+1175]mm · 9 of 238 slices shown, 15 images]
[im 24/238  soft-tissue]
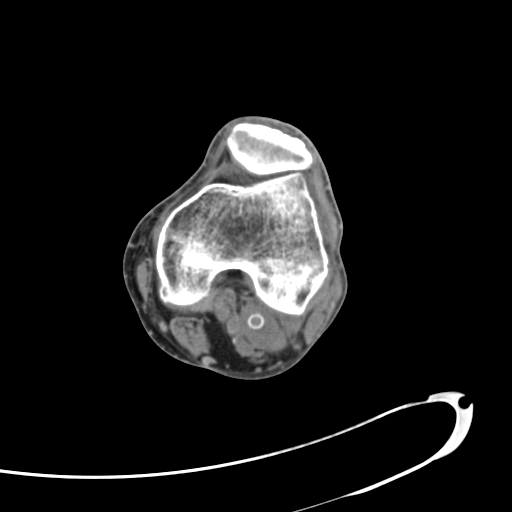
[im 24/238  bone]
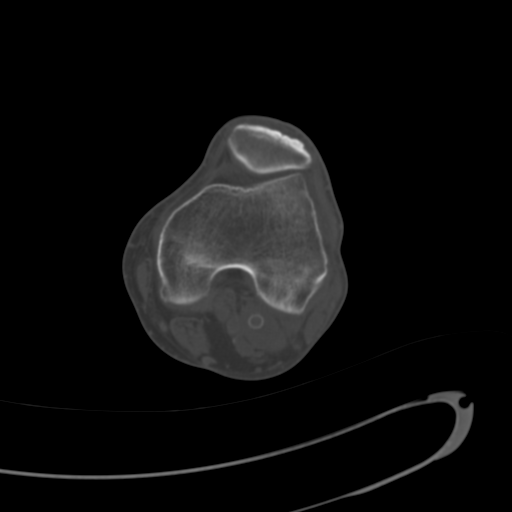
[im 48/238  soft-tissue]
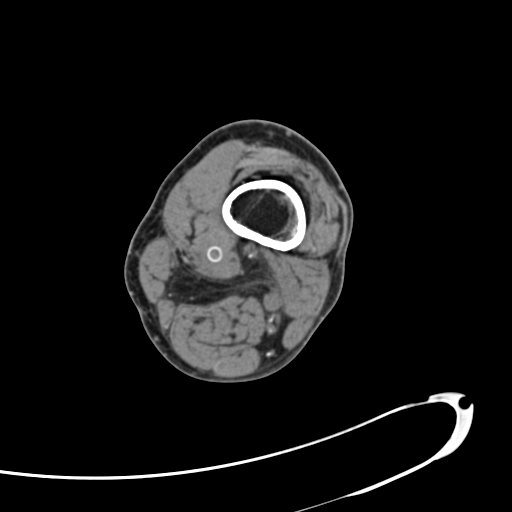
[im 72/238  soft-tissue]
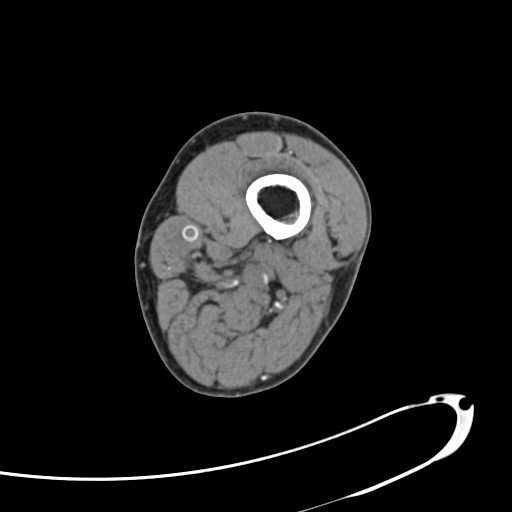
[im 95/238  soft-tissue]
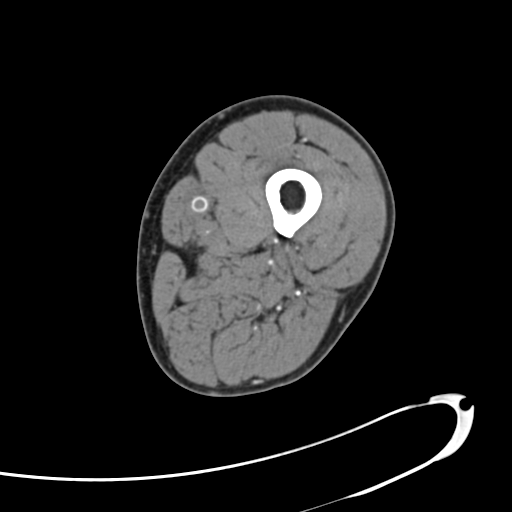
[im 119/238  soft-tissue]
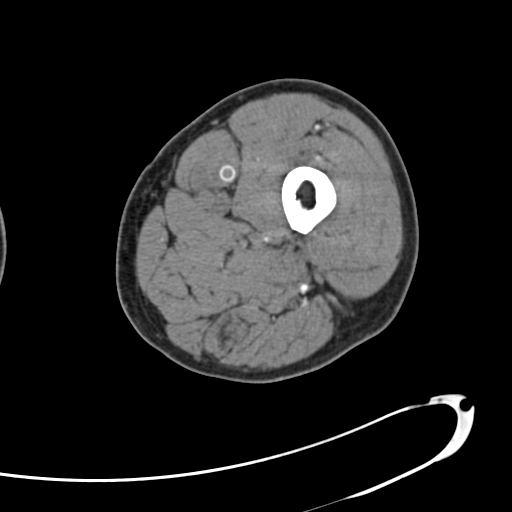
[im 143/238  soft-tissue]
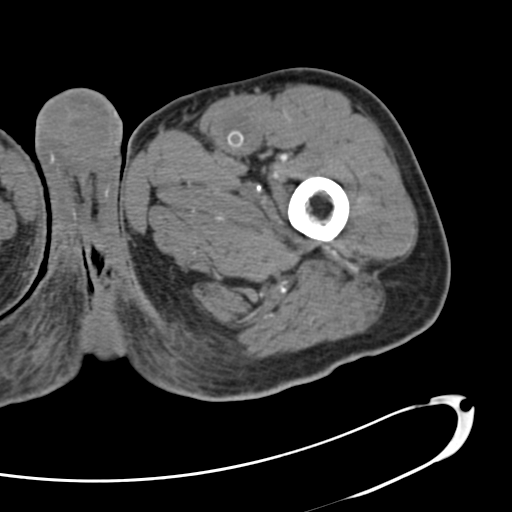
[im 143/238  lung]
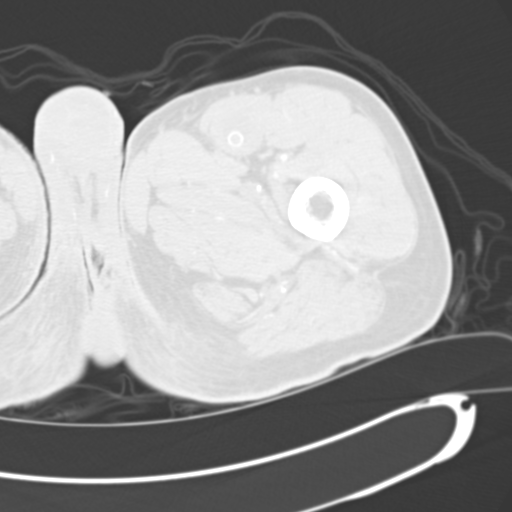
[im 166/238  soft-tissue]
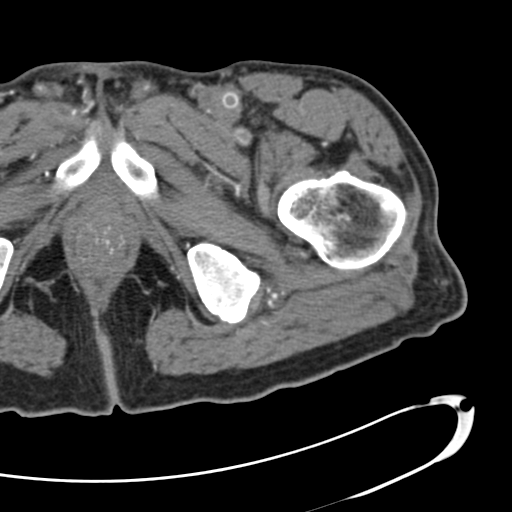
[im 166/238  lung]
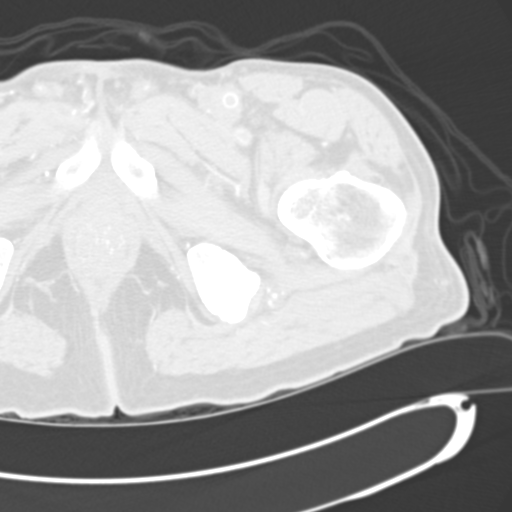
[im 190/238  soft-tissue]
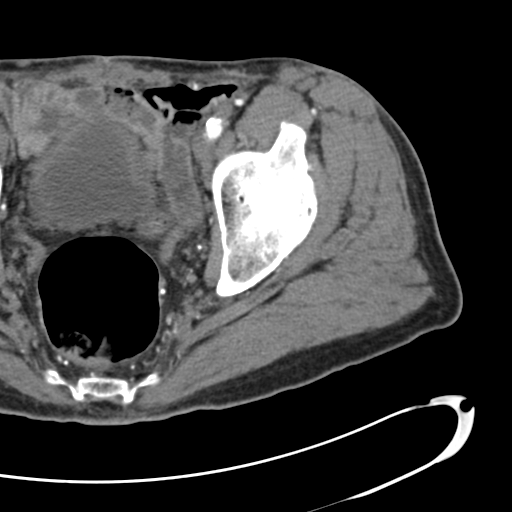
[im 190/238  lung]
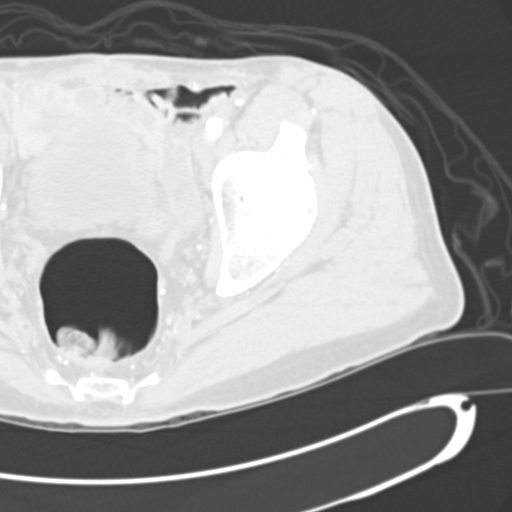
[im 214/238  soft-tissue]
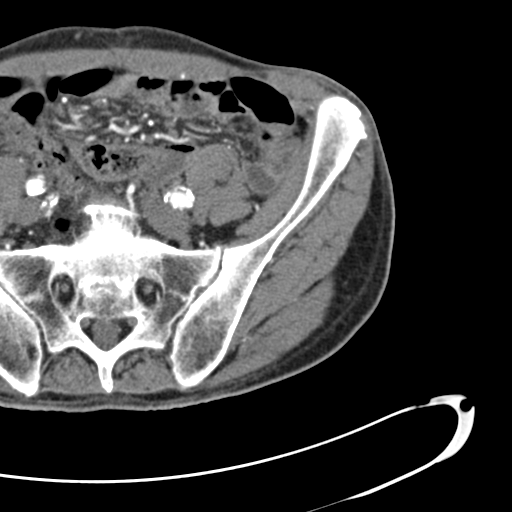
[im 214/238  lung]
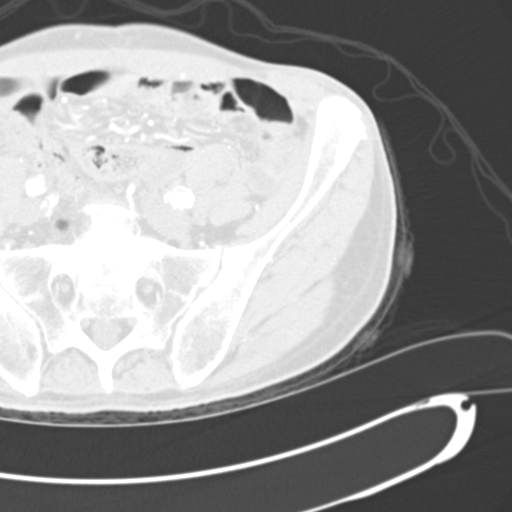
[im 214/238  bone]
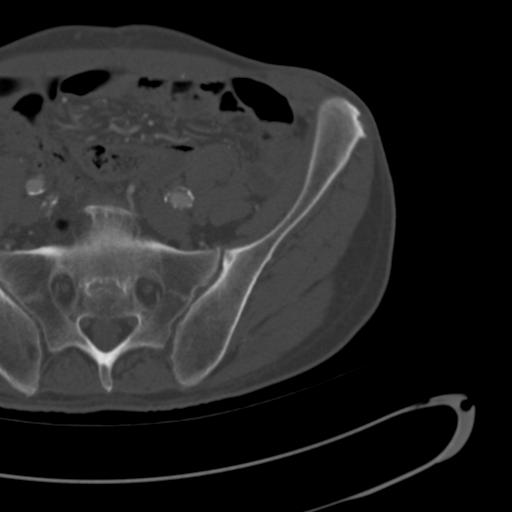

[Series 5: lower · axial · 0.62mm/px · z∈[+23,+164]mm · 3 of 235 slices shown]
[im 24/235  soft-tissue]
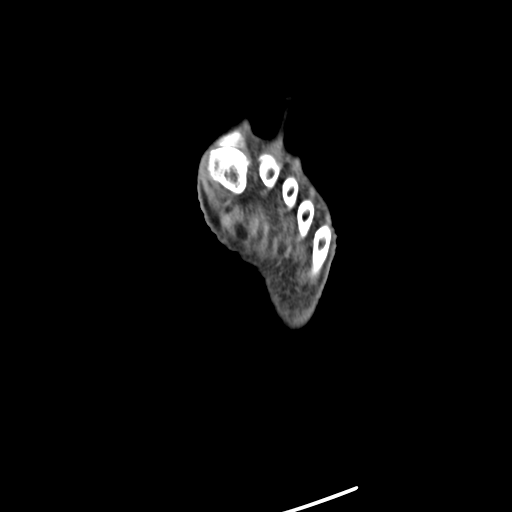
[im 47/235  soft-tissue]
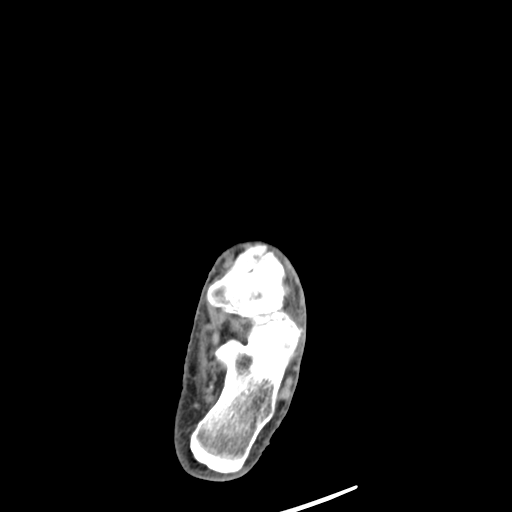
[im 71/235  soft-tissue]
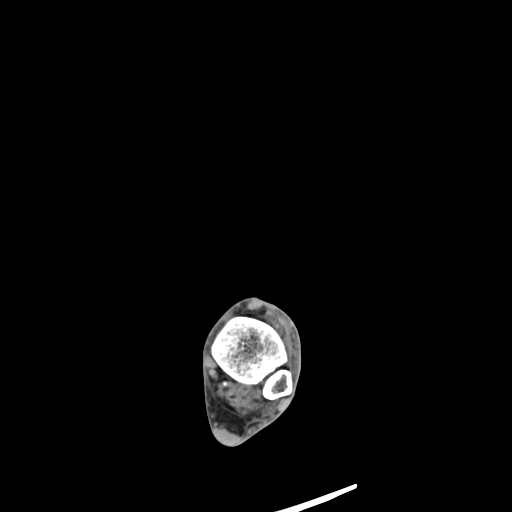

[12 of 46 positions shown; findings below may reference images not displayed]

FINDINGS: There is complete occlusion of the patient's left-sided fem-pop
bypass graft. This corresponds to the patient's acute symptoms.
There is vague diffuse apparent aneurysmal dilatation along the
course of the fem-pop bypass graft, which also appears completely
occluded.

There is underlying chronic incomplete occlusion of the native left
common femoral artery, and complete occlusion of the left
superficial femoral artery. A small amount of blood flow is noted
tracking into the profunda femoris artery and its branches, which
tracks along small branch vessels distally to the level of the knee.

Diffuse calcification is noted along the abdominal aorta and its
branches. There appears to be chronic occlusion of the internal
iliac arteries bilaterally. Visualized small and large bowel loops
are grossly unremarkable.

The visualized musculature is grossly unremarkable in appearance. No
acute osseous abnormalities are seen. No knee joint effusion is
identified. Postoperative change is noted about the prostate bed.
The bladder is mildly distended and grossly unremarkable.

Review of the MIP images confirms the above findings.
IMPRESSION: 1. Complete occlusion of the patient's left-sided fem-pop bypass
graft, corresponding to the patient's acute symptoms. Underlying
vague diffuse apparent aneurysmal dilatation along the course of the
fem-pop bypass graft, which also appears completely occluded.
2. Underlying chronic incomplete occlusion of the native left common
femoral artery, and complete occlusion of the left superficial
femoral artery. Small amount of blood flow tracks into the profunda
femoris artery and its branches, which extends along a small branch
vessels distally to the level of the knee and likely explains
residual left-sided pedal pulses.
3. Diffuse calcification along the abdominal aorta and its branches.
Chronic occlusion of the internal iliac arteries bilaterally.

Critical Value/emergent results were called by telephone at the time
of interpretation on 12/05/2015 at [DATE] to Dr. ALYSHA ROMULUS, who
verbally acknowledged these results.
